# Patient Record
Sex: Female | Born: 1935 | Race: White | Hispanic: No | Marital: Single | Smoking: Former smoker
Health system: Southern US, Community
[De-identification: ages and names within clinical notes are randomized; demographics above are authoritative.]

## PROBLEM LIST (undated history)

## (undated) DIAGNOSIS — D631 Anemia in chronic kidney disease: Secondary | ICD-10-CM

## (undated) DIAGNOSIS — Z853 Personal history of malignant neoplasm of breast: Secondary | ICD-10-CM

## (undated) DIAGNOSIS — H9190 Unspecified hearing loss, unspecified ear: Secondary | ICD-10-CM

## (undated) DIAGNOSIS — I779 Disorder of arteries and arterioles, unspecified: Secondary | ICD-10-CM

## (undated) DIAGNOSIS — E785 Hyperlipidemia, unspecified: Secondary | ICD-10-CM

## (undated) DIAGNOSIS — H02102 Unspecified ectropion of right lower eyelid: Secondary | ICD-10-CM

## (undated) DIAGNOSIS — K219 Gastro-esophageal reflux disease without esophagitis: Secondary | ICD-10-CM

## (undated) DIAGNOSIS — Z8489 Family history of other specified conditions: Secondary | ICD-10-CM

## (undated) DIAGNOSIS — Z9289 Personal history of other medical treatment: Secondary | ICD-10-CM

## (undated) DIAGNOSIS — I2510 Atherosclerotic heart disease of native coronary artery without angina pectoris: Secondary | ICD-10-CM

## (undated) DIAGNOSIS — H547 Unspecified visual loss: Secondary | ICD-10-CM

## (undated) DIAGNOSIS — R29810 Facial weakness: Secondary | ICD-10-CM

## (undated) DIAGNOSIS — M810 Age-related osteoporosis without current pathological fracture: Secondary | ICD-10-CM

## (undated) DIAGNOSIS — C50919 Malignant neoplasm of unspecified site of unspecified female breast: Secondary | ICD-10-CM

## (undated) DIAGNOSIS — N189 Chronic kidney disease, unspecified: Secondary | ICD-10-CM

## (undated) DIAGNOSIS — I959 Hypotension, unspecified: Secondary | ICD-10-CM

## (undated) DIAGNOSIS — Z9981 Dependence on supplemental oxygen: Secondary | ICD-10-CM

## (undated) DIAGNOSIS — K31819 Angiodysplasia of stomach and duodenum without bleeding: Secondary | ICD-10-CM

## (undated) DIAGNOSIS — R011 Cardiac murmur, unspecified: Secondary | ICD-10-CM

## (undated) DIAGNOSIS — R001 Bradycardia, unspecified: Secondary | ICD-10-CM

## (undated) DIAGNOSIS — M1990 Unspecified osteoarthritis, unspecified site: Secondary | ICD-10-CM

## (undated) DIAGNOSIS — N186 End stage renal disease: Secondary | ICD-10-CM

## (undated) DIAGNOSIS — I739 Peripheral vascular disease, unspecified: Secondary | ICD-10-CM

## (undated) DIAGNOSIS — I10 Essential (primary) hypertension: Secondary | ICD-10-CM

## (undated) DIAGNOSIS — Z992 Dependence on renal dialysis: Principal | ICD-10-CM

## (undated) DIAGNOSIS — I340 Nonrheumatic mitral (valve) insufficiency: Secondary | ICD-10-CM

## (undated) HISTORY — DX: Gastro-esophageal reflux disease without esophagitis: K21.9

## (undated) HISTORY — DX: Disorder of arteries and arterioles, unspecified: I77.9

## (undated) HISTORY — PX: BREAST LUMPECTOMY: SHX2

## (undated) HISTORY — DX: Angiodysplasia of stomach and duodenum without bleeding: K31.819

## (undated) HISTORY — PX: BREAST BIOPSY: SHX20

## (undated) HISTORY — DX: Unspecified ectropion of right lower eyelid: H02.102

## (undated) HISTORY — DX: Hyperlipidemia, unspecified: E78.5

## (undated) HISTORY — DX: Nonrheumatic mitral (valve) insufficiency: I34.0

## (undated) HISTORY — DX: Age-related osteoporosis without current pathological fracture: M81.0

## (undated) HISTORY — DX: Atherosclerotic heart disease of native coronary artery without angina pectoris: I25.10

## (undated) HISTORY — DX: Essential (primary) hypertension: I10

## (undated) HISTORY — DX: Chronic kidney disease, unspecified: D63.1

## (undated) HISTORY — DX: End stage renal disease: Z99.2

## (undated) HISTORY — DX: Cardiac murmur, unspecified: R01.1

## (undated) HISTORY — PX: HERNIA REPAIR: SHX51

## (undated) HISTORY — DX: Chronic kidney disease, unspecified: N18.9

## (undated) HISTORY — DX: Personal history of malignant neoplasm of breast: Z85.3

## (undated) HISTORY — DX: Peripheral vascular disease, unspecified: I73.9

## (undated) HISTORY — DX: Facial weakness: R29.810

## (undated) HISTORY — DX: End stage renal disease: N18.6

## (undated) SURGERY — ESOPHAGOGASTRODUODENOSCOPY (EGD) WITH PROPOFOL
Anesthesia: Monitor Anesthesia Care

---
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
## 1964-03-29 HISTORY — PX: EYE SURGERY: SHX253

## 1978-03-29 HISTORY — PX: CATARACT EXTRACTION: SUR2

## 2002-03-29 DIAGNOSIS — Z853 Personal history of malignant neoplasm of breast: Secondary | ICD-10-CM

## 2002-03-29 HISTORY — DX: Personal history of malignant neoplasm of breast: Z85.3

## 2002-12-18 ENCOUNTER — Encounter: Payer: Self-pay | Admitting: General Surgery

## 2002-12-18 ENCOUNTER — Encounter: Admission: RE | Admit: 2002-12-18 | Discharge: 2002-12-18 | Payer: Self-pay | Admitting: General Surgery

## 2002-12-19 ENCOUNTER — Ambulatory Visit (HOSPITAL_COMMUNITY): Admission: RE | Admit: 2002-12-19 | Discharge: 2002-12-19 | Payer: Self-pay | Admitting: General Surgery

## 2002-12-19 ENCOUNTER — Encounter (INDEPENDENT_AMBULATORY_CARE_PROVIDER_SITE_OTHER): Payer: Self-pay | Admitting: General Surgery

## 2002-12-19 ENCOUNTER — Ambulatory Visit (HOSPITAL_BASED_OUTPATIENT_CLINIC_OR_DEPARTMENT_OTHER): Admission: RE | Admit: 2002-12-19 | Discharge: 2002-12-19 | Payer: Self-pay | Admitting: General Surgery

## 2002-12-19 ENCOUNTER — Encounter (INDEPENDENT_AMBULATORY_CARE_PROVIDER_SITE_OTHER): Payer: Self-pay | Admitting: *Deleted

## 2002-12-19 ENCOUNTER — Encounter: Payer: Self-pay | Admitting: General Surgery

## 2002-12-28 ENCOUNTER — Ambulatory Visit: Admission: RE | Admit: 2002-12-28 | Discharge: 2003-01-26 | Payer: Self-pay | Admitting: *Deleted

## 2003-01-31 ENCOUNTER — Ambulatory Visit: Admission: RE | Admit: 2003-01-31 | Discharge: 2003-05-01 | Payer: Self-pay | Admitting: *Deleted

## 2003-02-07 ENCOUNTER — Encounter (INDEPENDENT_AMBULATORY_CARE_PROVIDER_SITE_OTHER): Payer: Self-pay | Admitting: Specialist

## 2003-02-07 ENCOUNTER — Ambulatory Visit (HOSPITAL_COMMUNITY): Admission: RE | Admit: 2003-02-07 | Discharge: 2003-02-07 | Payer: Self-pay | Admitting: General Surgery

## 2003-10-04 ENCOUNTER — Ambulatory Visit: Admission: RE | Admit: 2003-10-04 | Discharge: 2003-10-04 | Payer: Self-pay | Admitting: *Deleted

## 2004-05-13 ENCOUNTER — Inpatient Hospital Stay (HOSPITAL_COMMUNITY): Admission: EM | Admit: 2004-05-13 | Discharge: 2004-05-17 | Payer: Self-pay | Admitting: Emergency Medicine

## 2004-07-13 ENCOUNTER — Ambulatory Visit: Payer: Self-pay | Admitting: Oncology

## 2004-09-21 ENCOUNTER — Ambulatory Visit: Payer: Self-pay

## 2004-10-07 ENCOUNTER — Ambulatory Visit: Admission: RE | Admit: 2004-10-07 | Discharge: 2004-10-07 | Payer: Self-pay | Admitting: *Deleted

## 2005-01-11 ENCOUNTER — Ambulatory Visit: Payer: Self-pay | Admitting: Oncology

## 2005-05-27 ENCOUNTER — Ambulatory Visit: Payer: Self-pay

## 2005-06-27 ENCOUNTER — Ambulatory Visit: Payer: Self-pay

## 2005-07-06 ENCOUNTER — Ambulatory Visit: Payer: Self-pay | Admitting: Oncology

## 2005-07-06 LAB — COMPREHENSIVE METABOLIC PANEL
ALT: 22 U/L (ref 0–40)
AST: 21 U/L (ref 0–37)
Albumin: 4.2 g/dL (ref 3.5–5.2)
Calcium: 9.6 mg/dL (ref 8.4–10.5)
Creatinine, Ser: 1.8 mg/dL — ABNORMAL HIGH (ref 0.4–1.2)
Glucose, Bld: 182 mg/dL — ABNORMAL HIGH (ref 70–99)
Total Protein: 8 g/dL (ref 6.0–8.3)

## 2005-07-06 LAB — CBC WITH DIFFERENTIAL/PLATELET
Basophils Absolute: 0 10*3/uL (ref 0.0–0.1)
Eosinophils Absolute: 0.3 10*3/uL (ref 0.0–0.5)
HGB: 12.8 g/dL (ref 11.6–15.9)
LYMPH%: 27.7 % (ref 14.0–48.0)
MCV: 86.4 fL (ref 81.0–101.0)
MONO%: 8.1 % (ref 0.0–13.0)
NEUT#: 4.1 10*3/uL (ref 1.5–6.5)
NEUT%: 59.2 % (ref 39.6–76.8)
Platelets: 318 10*3/uL (ref 145–400)

## 2005-07-06 LAB — CANCER ANTIGEN 27.29: CA 27.29: 40 U/mL — ABNORMAL HIGH (ref 0–39)

## 2005-07-13 ENCOUNTER — Ambulatory Visit (HOSPITAL_COMMUNITY): Admission: RE | Admit: 2005-07-13 | Discharge: 2005-07-13 | Payer: Self-pay | Admitting: Oncology

## 2005-07-26 ENCOUNTER — Ambulatory Visit (HOSPITAL_COMMUNITY): Admission: RE | Admit: 2005-07-26 | Discharge: 2005-07-26 | Payer: Self-pay | Admitting: Oncology

## 2005-07-27 ENCOUNTER — Ambulatory Visit: Payer: Self-pay

## 2005-08-25 ENCOUNTER — Ambulatory Visit: Payer: Self-pay | Admitting: Oncology

## 2005-08-31 LAB — CBC WITH DIFFERENTIAL/PLATELET
Basophils Absolute: 0 10*3/uL (ref 0.0–0.1)
Eosinophils Absolute: 0.4 10*3/uL (ref 0.0–0.5)
HCT: 38 % (ref 34.8–46.6)
HGB: 12.7 g/dL (ref 11.6–15.9)
MCH: 29.1 pg (ref 26.0–34.0)
MCV: 86.7 fL (ref 81.0–101.0)
MONO%: 9.8 % (ref 0.0–13.0)
NEUT#: 4.4 10*3/uL (ref 1.5–6.5)
NEUT%: 56.6 % (ref 39.6–76.8)
RDW: 15.1 % — ABNORMAL HIGH (ref 11.3–14.5)

## 2005-08-31 LAB — COMPREHENSIVE METABOLIC PANEL
Alkaline Phosphatase: 66 U/L (ref 39–117)
Glucose, Bld: 87 mg/dL (ref 70–99)
Sodium: 141 mEq/L (ref 135–145)
Total Bilirubin: 0.4 mg/dL (ref 0.3–1.2)
Total Protein: 7.8 g/dL (ref 6.0–8.3)

## 2006-01-07 ENCOUNTER — Ambulatory Visit: Payer: Self-pay | Admitting: Oncology

## 2006-03-04 ENCOUNTER — Ambulatory Visit: Payer: Self-pay | Admitting: Oncology

## 2006-03-08 LAB — CBC WITH DIFFERENTIAL/PLATELET
BASO%: 0.4 % (ref 0.0–2.0)
Basophils Absolute: 0 10*3/uL (ref 0.0–0.1)
EOS%: 2.2 % (ref 0.0–7.0)
HCT: 35.4 % (ref 34.8–46.6)
HGB: 11.9 g/dL (ref 11.6–15.9)
LYMPH%: 22.3 % (ref 14.0–48.0)
MCH: 29.7 pg (ref 26.0–34.0)
MCHC: 33.7 g/dL (ref 32.0–36.0)
MCV: 88.1 fL (ref 81.0–101.0)
MONO%: 8.2 % (ref 0.0–13.0)
NEUT%: 66.9 % (ref 39.6–76.8)
Platelets: 352 10*3/uL (ref 145–400)
lymph#: 1.9 10*3/uL (ref 0.9–3.3)

## 2006-03-08 LAB — COMPREHENSIVE METABOLIC PANEL
ALT: 33 U/L (ref 0–35)
AST: 39 U/L — ABNORMAL HIGH (ref 0–37)
Alkaline Phosphatase: 59 U/L (ref 39–117)
BUN: 33 mg/dL — ABNORMAL HIGH (ref 6–23)
Calcium: 8.9 mg/dL (ref 8.4–10.5)
Chloride: 104 mEq/L (ref 96–112)
Creatinine, Ser: 1.54 mg/dL — ABNORMAL HIGH (ref 0.40–1.20)
Total Bilirubin: 0.3 mg/dL (ref 0.3–1.2)

## 2007-03-03 ENCOUNTER — Ambulatory Visit: Payer: Self-pay | Admitting: Oncology

## 2007-03-09 LAB — CBC WITH DIFFERENTIAL/PLATELET
BASO%: 0.6 % (ref 0.0–2.0)
HCT: 34.3 % — ABNORMAL LOW (ref 34.8–46.6)
MCHC: 33.7 g/dL (ref 32.0–36.0)
MONO#: 0.6 10*3/uL (ref 0.1–0.9)
NEUT%: 61.4 % (ref 39.6–76.8)
WBC: 7.4 10*3/uL (ref 3.9–10.0)
lymph#: 2.1 10*3/uL (ref 0.9–3.3)

## 2007-03-09 LAB — COMPREHENSIVE METABOLIC PANEL
ALT: 24 U/L (ref 0–35)
Albumin: 4 g/dL (ref 3.5–5.2)
CO2: 23 mEq/L (ref 19–32)
Calcium: 9.1 mg/dL (ref 8.4–10.5)
Chloride: 106 mEq/L (ref 96–112)
Creatinine, Ser: 1.85 mg/dL — ABNORMAL HIGH (ref 0.40–1.20)
Sodium: 142 mEq/L (ref 135–145)
Total Protein: 7.4 g/dL (ref 6.0–8.3)

## 2007-03-09 LAB — CANCER ANTIGEN 27.29: CA 27.29: 36 U/mL (ref 0–39)

## 2007-12-29 ENCOUNTER — Ambulatory Visit: Payer: Self-pay | Admitting: Oncology

## 2008-01-02 LAB — CBC WITH DIFFERENTIAL/PLATELET
BASO%: 0.3 % (ref 0.0–2.0)
Basophils Absolute: 0 10*3/uL (ref 0.0–0.1)
EOS%: 2.8 % (ref 0.0–7.0)
MCH: 29.9 pg (ref 26.0–34.0)
MCHC: 33.6 g/dL (ref 32.0–36.0)
MCV: 89 fL (ref 81.0–101.0)
MONO%: 9.2 % (ref 0.0–13.0)
NEUT%: 62.7 % (ref 39.6–76.8)
RDW: 14.2 % (ref 11.3–14.5)
lymph#: 2 10*3/uL (ref 0.9–3.3)

## 2008-01-02 LAB — COMPREHENSIVE METABOLIC PANEL
ALT: 37 U/L — ABNORMAL HIGH (ref 0–35)
AST: 36 U/L (ref 0–37)
Alkaline Phosphatase: 62 U/L (ref 39–117)
BUN: 30 mg/dL — ABNORMAL HIGH (ref 6–23)
Calcium: 8.9 mg/dL (ref 8.4–10.5)
Chloride: 105 mEq/L (ref 96–112)
Creatinine, Ser: 1.96 mg/dL — ABNORMAL HIGH (ref 0.40–1.20)

## 2008-07-29 ENCOUNTER — Emergency Department (HOSPITAL_COMMUNITY): Admission: EM | Admit: 2008-07-29 | Discharge: 2008-07-29 | Payer: Self-pay | Admitting: Emergency Medicine

## 2009-01-17 ENCOUNTER — Ambulatory Visit: Payer: Self-pay | Admitting: Oncology

## 2009-01-21 LAB — CBC WITH DIFFERENTIAL/PLATELET
BASO%: 0.3 % (ref 0.0–2.0)
Eosinophils Absolute: 0.1 10*3/uL (ref 0.0–0.5)
LYMPH%: 32.3 % (ref 14.0–49.7)
MCHC: 33.2 g/dL (ref 31.5–36.0)
MONO#: 0.7 10*3/uL (ref 0.1–0.9)
NEUT#: 5.2 10*3/uL (ref 1.5–6.5)
Platelets: 268 10*3/uL (ref 145–400)
RBC: 3.81 10*6/uL (ref 3.70–5.45)
RDW: 14.1 % (ref 11.2–14.5)
WBC: 9 10*3/uL (ref 3.9–10.3)
lymph#: 2.9 10*3/uL (ref 0.9–3.3)

## 2009-01-21 LAB — COMPREHENSIVE METABOLIC PANEL
ALT: 36 U/L — ABNORMAL HIGH (ref 0–35)
Albumin: 3.7 g/dL (ref 3.5–5.2)
CO2: 24 mEq/L (ref 19–32)
Chloride: 103 mEq/L (ref 96–112)
Glucose, Bld: 93 mg/dL (ref 70–99)
Potassium: 3.9 mEq/L (ref 3.5–5.3)
Sodium: 138 mEq/L (ref 135–145)
Total Bilirubin: 0.5 mg/dL (ref 0.3–1.2)
Total Protein: 7.8 g/dL (ref 6.0–8.3)

## 2009-01-21 LAB — CANCER ANTIGEN 27.29: CA 27.29: 37 U/mL (ref 0–39)

## 2009-01-23 ENCOUNTER — Ambulatory Visit (HOSPITAL_COMMUNITY): Admission: RE | Admit: 2009-01-23 | Discharge: 2009-01-23 | Payer: Self-pay | Admitting: Oncology

## 2009-02-26 ENCOUNTER — Encounter: Payer: Self-pay | Admitting: Cardiovascular Disease

## 2009-02-26 ENCOUNTER — Ambulatory Visit: Payer: Self-pay

## 2009-03-29 HISTORY — PX: UMBILICAL HERNIA REPAIR: SHX196

## 2009-09-03 ENCOUNTER — Ambulatory Visit: Payer: Self-pay | Admitting: Vascular Surgery

## 2010-01-08 ENCOUNTER — Ambulatory Visit: Payer: Self-pay | Admitting: Oncology

## 2010-01-12 LAB — COMPREHENSIVE METABOLIC PANEL
ALT: 27 U/L (ref 0–35)
AST: 35 U/L (ref 0–37)
Albumin: 3.6 g/dL (ref 3.5–5.2)
Alkaline Phosphatase: 44 U/L (ref 39–117)
BUN: 66 mg/dL — ABNORMAL HIGH (ref 6–23)
CO2: 28 mEq/L (ref 19–32)
Calcium: 8.7 mg/dL (ref 8.4–10.5)
Chloride: 106 mEq/L (ref 96–112)
Creatinine, Ser: 5.33 mg/dL — ABNORMAL HIGH (ref 0.40–1.20)
Glucose, Bld: 68 mg/dL — ABNORMAL LOW (ref 70–99)
Potassium: 4 mEq/L (ref 3.5–5.3)
Sodium: 141 mEq/L (ref 135–145)
Total Bilirubin: 0.7 mg/dL (ref 0.3–1.2)
Total Protein: 7.2 g/dL (ref 6.0–8.3)

## 2010-01-12 LAB — CBC WITH DIFFERENTIAL/PLATELET
BASO%: 0.4 % (ref 0.0–2.0)
Basophils Absolute: 0 10*3/uL (ref 0.0–0.1)
EOS%: 2 % (ref 0.0–7.0)
Eosinophils Absolute: 0.2 10*3/uL (ref 0.0–0.5)
HCT: 32.7 % — ABNORMAL LOW (ref 34.8–46.6)
HGB: 11.1 g/dL — ABNORMAL LOW (ref 11.6–15.9)
LYMPH%: 21 % (ref 14.0–49.7)
MCH: 30.3 pg (ref 25.1–34.0)
MCHC: 33.8 g/dL (ref 31.5–36.0)
MCV: 89.4 fL (ref 79.5–101.0)
MONO#: 0.7 10*3/uL (ref 0.1–0.9)
MONO%: 8 % (ref 0.0–14.0)
NEUT#: 6.1 10*3/uL (ref 1.5–6.5)
NEUT%: 68.6 % (ref 38.4–76.8)
Platelets: 257 10*3/uL (ref 145–400)
RBC: 3.66 10*6/uL — ABNORMAL LOW (ref 3.70–5.45)
RDW: 14.6 % — ABNORMAL HIGH (ref 11.2–14.5)
WBC: 8.8 10*3/uL (ref 3.9–10.3)
lymph#: 1.9 10*3/uL (ref 0.9–3.3)

## 2010-01-13 ENCOUNTER — Ambulatory Visit (HOSPITAL_COMMUNITY): Admission: RE | Admit: 2010-01-13 | Discharge: 2010-01-13 | Payer: Self-pay | Admitting: General Surgery

## 2010-01-13 LAB — VITAMIN D 25 HYDROXY (VIT D DEFICIENCY, FRACTURES): Vit D, 25-Hydroxy: 40 ng/mL (ref 30–89)

## 2010-01-21 ENCOUNTER — Ambulatory Visit: Payer: Self-pay | Admitting: Genetic Counselor

## 2010-06-10 LAB — GLUCOSE, CAPILLARY
Glucose-Capillary: 111 mg/dL — ABNORMAL HIGH (ref 70–99)
Glucose-Capillary: 154 mg/dL — ABNORMAL HIGH (ref 70–99)

## 2010-06-10 LAB — BASIC METABOLIC PANEL
BUN: 54 mg/dL — ABNORMAL HIGH (ref 6–23)
CO2: 26 mEq/L (ref 19–32)
Calcium: 9.6 mg/dL (ref 8.4–10.5)
Chloride: 105 mEq/L (ref 96–112)
Creatinine, Ser: 3.93 mg/dL — ABNORMAL HIGH (ref 0.4–1.2)
GFR calc Af Amer: 13 mL/min — ABNORMAL LOW (ref >60)
GFR calc non Af Amer: 11 mL/min — ABNORMAL LOW (ref >60)
Glucose, Bld: 72 mg/dL (ref 70–99)
Potassium: 4.3 mEq/L (ref 3.5–5.1)
Sodium: 142 mEq/L (ref 135–145)

## 2010-06-10 LAB — DIFFERENTIAL
Basophils Absolute: 0 10*3/uL (ref 0.0–0.1)
Basophils Relative: 0 % (ref 0–1)
Eosinophils Absolute: 0.3 10*3/uL (ref 0.0–0.7)
Eosinophils Relative: 3 % (ref 0–5)
Lymphocytes Relative: 28 % (ref 12–46)
Lymphs Abs: 2.5 10*3/uL (ref 0.7–4.0)
Monocytes Absolute: 0.8 10*3/uL (ref 0.1–1.0)
Monocytes Relative: 9 % (ref 3–12)
Neutro Abs: 5.5 10*3/uL (ref 1.7–7.7)
Neutrophils Relative %: 60 % (ref 43–77)

## 2010-06-10 LAB — CBC
HCT: 35.6 % — ABNORMAL LOW (ref 36.0–46.0)
Hemoglobin: 11.9 g/dL — ABNORMAL LOW (ref 12.0–15.0)
MCH: 30 pg (ref 26.0–34.0)
MCHC: 33.3 g/dL (ref 30.0–36.0)
MCV: 89.8 fL (ref 78.0–100.0)
Platelets: 277 10*3/uL (ref 150–400)
RBC: 3.96 MIL/uL (ref 3.87–5.11)
RDW: 14.4 % (ref 11.5–15.5)
WBC: 9.1 10*3/uL (ref 4.0–10.5)

## 2010-06-10 LAB — SURGICAL PCR SCREEN: Staphylococcus aureus: POSITIVE — AB

## 2010-06-10 LAB — APTT: aPTT: 28 seconds (ref 24–37)

## 2010-06-10 LAB — PROTIME-INR
INR: 0.93 (ref 0.00–1.49)
Prothrombin Time: 12.7 seconds (ref 11.6–15.2)

## 2010-07-08 ENCOUNTER — Ambulatory Visit
Admission: RE | Admit: 2010-07-08 | Discharge: 2010-07-08 | Disposition: A | Discharge status: Home / Self Care | Payer: Medicare Other | Source: Ambulatory Visit | Attending: Nephrology | Admitting: Nephrology

## 2010-07-08 ENCOUNTER — Other Ambulatory Visit: Payer: Self-pay | Admitting: Nephrology

## 2010-07-08 DIAGNOSIS — R0602 Shortness of breath: Secondary | ICD-10-CM

## 2010-07-08 DIAGNOSIS — R062 Wheezing: Secondary | ICD-10-CM

## 2010-07-16 ENCOUNTER — Encounter (INDEPENDENT_AMBULATORY_CARE_PROVIDER_SITE_OTHER): Payer: Medicare Other | Admitting: Vascular Surgery

## 2010-07-16 DIAGNOSIS — N186 End stage renal disease: Secondary | ICD-10-CM

## 2010-07-17 NOTE — Assessment & Plan Note (Signed)
OFFICE VISIT  [name], [name] A DOB:  04/07/1931                                       07/16/2010 CHART#:[id number]  The patient returns for follow-up today.  She was seen in June 2011 for possible placement of hemodialysis access.  The patient refused to have the access placed at that time.  She now returns for further evaluation and consideration again for placement of hemodialysis access.  She is currently not on hemodialysis.  She is right-handed.  Chronic medical problems remain diabetes, hypertension, elevated cholesterol which are followed by Dr. Vear Clock and Dr. Allena Katz and currently controlled.  PAST MEDICAL HISTORY:  Remarkable for breast cancer in 2004.  She also has a chronic right facial droop.  REVIEW OF SYSTEMS:  She denies any skin itching.  She denies any shortness of breath.  She denies any chest pain.  PHYSICAL EXAMINATION:  Blood pressure 160/57 in the left arm, heart rate 67 and regular, respirations 16, oxygen saturation is 99% on room air. In the right upper extremity she has a 2+ brachial and 2+ radial pulse. On placement tourniquet on the right arm, the cephalic vein is palpable at the antecubital region.  I reviewed her vein mapping ultrasound from June 2011 which shows the upper arm cephalic vein on the right side to be between 29 and 31 mm in diameter.  I believe the best option at this point would be to consider placing a right brachiocephalic AV fistula.  Risks, benefits, possible complications and procedure details were explained to the patient to include but not limited to bleeding, infection, non maturation of the fistula.  She understands and agrees to proceed.  We have scheduled her fistula placement for July 20, 2010.    Janetta Hora. Adwoa Axe, MD Electronically Signed  CEF/MEDQ  D:  07/16/2010  T:  07/17/2010  Job:  [id number]  cc:   Zetta Bills, MD

## 2010-07-20 ENCOUNTER — Ambulatory Visit (HOSPITAL_COMMUNITY): Payer: Medicare Other

## 2010-07-20 ENCOUNTER — Ambulatory Visit (HOSPITAL_COMMUNITY)
Admission: RE | Admit: 2010-07-20 | Discharge: 2010-07-20 | Disposition: A | Discharge status: Home / Self Care | Payer: Medicare Other | Source: Ambulatory Visit | Attending: Vascular Surgery | Admitting: Vascular Surgery

## 2010-07-20 DIAGNOSIS — F3289 Other specified depressive episodes: Secondary | ICD-10-CM | POA: Insufficient documentation

## 2010-07-20 DIAGNOSIS — I120 Hypertensive chronic kidney disease with stage 5 chronic kidney disease or end stage renal disease: Secondary | ICD-10-CM

## 2010-07-20 DIAGNOSIS — N186 End stage renal disease: Secondary | ICD-10-CM

## 2010-07-20 DIAGNOSIS — F329 Major depressive disorder, single episode, unspecified: Secondary | ICD-10-CM | POA: Insufficient documentation

## 2010-07-20 DIAGNOSIS — E119 Type 2 diabetes mellitus without complications: Secondary | ICD-10-CM | POA: Insufficient documentation

## 2010-07-20 HISTORY — PX: AV FISTULA PLACEMENT: SHX1204

## 2010-07-20 LAB — GLUCOSE, CAPILLARY: Glucose-Capillary: 183 mg/dL — ABNORMAL HIGH (ref 70–99)

## 2010-07-20 LAB — POCT I-STAT 4, (NA,K, GLUC, HGB,HCT): Sodium: 140 mEq/L (ref 135–145)

## 2010-07-20 LAB — SURGICAL PCR SCREEN
MRSA, PCR: NEGATIVE
Staphylococcus aureus: NEGATIVE

## 2010-08-03 NOTE — Op Note (Signed)
  NAMELAURI, [name]                 ACCOUNT NO.:  [id number]  MEDICAL RECORD NO.:  [id number]           PATIENT TYPE:  O  LOCATION:  SDSC                         FACILITY:  [facility]  PHYSICIAN:  Charles E. Fields, MD  DATE OF BIRTH:  February 20, 1932  DATE OF PROCEDURE:  07/20/2010 DATE OF DISCHARGE:                              OPERATIVE REPORT   PROCEDURE:  Right brachiocephalic arteriovenous fistula.  PREOPERATIVE DIAGNOSIS:  End-stage renal disease.  POSTOPERATIVE DIAGNOSIS:  End-stage renal disease.  ANESTHESIA:  General.  ASSISTANT:  Newton Pigg, PA-C  OPERATIVE FINDINGS: 1. 3-3.5 mm right cephalic vein. 2. 3-mm right brachial artery.  OPERATIVE DETAILS:  After obtaining informed consent, the patient was taken to the operating room.  The patient was placed in supine position on operating table.  After induction of general anesthesia and endotracheal intubation, the patient's entire right upper extremity was prepped and draped in usual sterile fashion.  Transverse incision was made on the right antecubital crease, carried down to the subcutaneous tissues down the level of the right cephalic vein.  Cephalic vein was dissected free circumferentially and small side branches were ligated and divided between silk ties or clips.  Vein was of good quality approximately 3-3.5 mm in diameter.  Next, the brachial artery was dissected free in the medial portion of the incision.  This was approximately 3 mm in diameter.  Small side branches were ligated and divided between silk ties.  The patient was given 5000 units of intravenous heparin.  Vessel loops were used to control the brachial artery proximally and distally.  Distal cephalic vein was ligated with a 2-0 silk tie.  The vein was then transected and swung over the level of the artery.  The vein was controlled proximally with fine bulldog clamp. Vein was then sewn end-of-vein to side-of- artery after creating a longitudinal  arteriotomy using a running 7-0 Prolene suture.  Just prior to completion of anastomosis, it was fore bled, back bled and thoroughly flushed.  Anastomosis was secured.  Vessel loops released.  There was a palpable thrill in the fistula immediately.  The patient had a palpable radial pulse as well.  Hemostasis was obtained.  Subcutaneous tissues were reapproximated using running 3-0 Vicryl suture.  Skin was closed with a 4-0 Vicryl subcuticular stitch.  The patient tolerated the procedure well and there were no complications.  Instrument, sponge and needle counts were correct at the end of the case.  The patient was extubated in the operating room and taken to recovery room in stable condition.     [name], MD     CEF/MEDQ  D:  07/20/2010  T:  07/20/2010  Job:  [id number]  Electronically Signed by Fabienne Bruns MD on 08/03/2010 07:57:27 AM

## 2010-08-11 NOTE — Assessment & Plan Note (Signed)
OFFICE VISIT   Jill [name], Jill [name]  DOB:  Apr 14, 1931                                       09/03/2009  CHART#:[id number]   CHIEF COMPLAINT:  Needs dialysis access.   HISTORY OF PRESENT ILLNESS:  The patient is [name] 77 year old female  referred by Dr. Allena [name] for evaluation for hemodialysis access.  She is  currently not on hemodialysis.  She has nephropathy thought to be  secondary to diabetes.  She is right-handed.  Chronic medical problems  include diabetes, hypertension and elevated cholesterol.  These are all  followed by Dr. Vear [name] and Dr. Allena [name] and are currently controlled.   Past medical history otherwise is remarkable for breast cancer which she  had in 2004.  She had [name] sentinel node biopsy in the left axilla and [name]  left breast lumpectomy.  She has been disease free for since that time.  She also has some baseline numbness in her left hand.  She also has [name]  right facial droop.   PAST SURGICAL HISTORY:  She had some type of eye procedure in 1966 and  had [name] cataract removed in 1981.   Family history is remarkable for her brother who had vascular disease at  [name] young age.   MEDICATIONS:  Amlodipine, torsemide, paroxetine, simvastatin, glyburide,  tamoxifen, aspirin, calcium and Centrum Silver.  Also aspirin 81 mg once  [name] day.   She has allergy listed to sulfa which causes [name] rash.   PHYSICAL EXAMINATION:  Blood pressure is 186/78 in the left arm, heart  rate is 85 and regular, respirations 20.  HEENT is unremarkable.  Neck  has 2+ carotid pulses without bruit.  Chest is clear to auscultation.  Cardiac exam is regular rate and rhythm without murmur.  Abdomen was  soft, nontender, nondistended.  No masses.  Extremities:  She has 2+  brachial and radial pulses bilaterally.  The cephalic vein is palpable  on placement of the tourniquet but difficult to palpate.  Musculoskeletal exam shows no obvious major joint deformities.  Neurologic exam shows [name] right  facial droop.  Skin has no open ulcers or  rashes.   She had [name] vein mapping ultrasound of both upper extremities today.  This  showed [name] fairly small vein bilaterally but she did have [name] reasonable  sized cephalic vein on the right side to consider placing [name] right  brachiocephalic AV fistula.  I discussed with the patient the risks,  benefits, possible complications and procedure details for fistula  placement.  She recently had [name] brother who was ill in the hospital and  wants to wait [name] few weeks before having the fistula placed.  Therefore  we have scheduled this for 09/18/2009.  I discussed with her today  risks, benefits and complications including bleeding, infection,  ischemic steal, nonmaturation of the fistula.  She understands and  agrees to proceed.     Jill Hora. Fields, MD  Electronically Signed   CEF/MEDQ  D:  09/04/2009  T:  09/04/2009  Job:  [id number]   cc:   Jill Bills, MD  Jill [name]

## 2010-08-11 NOTE — Procedures (Signed)
CEPHALIC VEIN MAPPING   INDICATION:  Preop for AV fistula placement.   HISTORY:  Stage 4 chronic kidney disease.   EXAM:  The right cephalic vein is compressible with diameter measurements  ranging from 0.21 to 0.31 cm.   The right basilic vein is compressible with diameter measurements  ranging from 0.27 to 0.28 cm.   The left cephalic vein is compressible with diameter measurements  ranging from 0.14 to 0.32 cm.   The left basilic vein is compressible with diameter measurements ranging  from 0.32 to 0.38 cm.   See attached worksheet for all measurements.   IMPRESSION:  Patent bilateral cephalic and basilic veins with diameter  measurements as described above.   ___________________________________________  Jill Hora. Fields, MD   CH/MEDQ  D:  09/03/2009  T:  09/03/2009  Job:  [id number]

## 2010-08-14 ENCOUNTER — Encounter (HOSPITAL_COMMUNITY): Payer: Medicare Other | Attending: Nephrology

## 2010-08-14 ENCOUNTER — Other Ambulatory Visit: Payer: Self-pay | Admitting: Nephrology

## 2010-08-14 DIAGNOSIS — D638 Anemia in other chronic diseases classified elsewhere: Secondary | ICD-10-CM | POA: Insufficient documentation

## 2010-08-14 DIAGNOSIS — N185 Chronic kidney disease, stage 5: Secondary | ICD-10-CM | POA: Insufficient documentation

## 2010-08-14 NOTE — H&P (Signed)
NAME:  GHISLAINE, HARCUM NO.:  [id number]   MEDICAL RECORD NO.:  [id number]          PATIENT TYPE:  EMS   LOCATION:  ED                           [facility]ACILITY[facility]  [facility]   PHYSICIAN:  Elliot Cousin, M.D.    DATE OF BIRTH:  25-Jun-1931   DATE OF ADMISSION:  05/13/2004  DATE OF DISCHARGE:                                HISTORY & PHYSICAL   PRIMARY CARE PHYSICIAN:  Loma Sender, M.D.   ONCOLOGIST:  Valentino Hue. Magrinat, M.D.   RADIATION ONCOLOGIST:  Elmer Sow. Dorna Bloom, M.D.   CHIEF COMPLAINT:  I feel sick.   HISTORY OF PRESENT ILLNESS:  The patient is a 77 year old lady with a past  medical history significant for type 2 diabetes mellitus, hypertension, and  left breast cancer who presented to the emergency department this morning  with a one-week history of intermittent nausea, vomiting, and diarrhea.  The  patient's nausea, vomiting, and diarrhea were much worse over the past  weekend which was three days ago.  The patient has been able to eat only a  little.  She has been able to drink a little juice and a little water.  She  has had multiple bouts of nausea and vomiting over the past 48 hours.  The  diarrhea is not quite as bad.  She has only had four to five loose bowel  movements over the past 48 hours.  The patient denies hemoptysis.  She  denies hematemesis.  She denies melena.  She denies bright red blood per  rectum.  She denies any sick contacts.  She has not had any recent  antibiotic therapy.  She complains of generalized weakness but no focal  weakness.  The patient had a dream this morning and slid out of bed.  There  was no head trauma.  Her brother, who had been staying with her over the  past couple of nights, found her on the floor lying on pillows.  The patient  believes that she called out to her brother who came into the room. When her  brother, Mr. Amilia Vandenbrink, helped her up onto the bed, he did notice that the  patient was a little disoriented but  quickly became oriented.  There was no  focal weakness, facial droop, slurred speech or trauma.  There was no  witnessed seizure activity.  The patient, however, did appear diaphoretic.  When EMS arrived, it was noted that her capillary blood sugar was 59.  The  patient was given some Coca-Cola and was transferred to the emergency  department.   When the patient arrived to the emergency department, her temperature was  102.3, heart rate 118, and oxygen saturation 94% on room air.  Her blood  glucose was 79. Her white blood cell count was elevated at 15.1.  Her sodium  was low at 127. The urinalysis was essentially negative with the exception  of protein.  Her LFTs were elevated with a SGOT of 90 and a SGPT of 176.  Her chest x-ray essentially revealed no acute disease.  The patient will be  admitted  for further evaluation and management.   MEDICATIONS:  1.  Glucovance 500 mg/5 mg daily.  2.  Norvasc 5 mg daily.  3.  Arimidex 1 mg daily.  4.  Calcium with vitamin D 600 mg b.i.d.  5.  Aspirin 81 mg daily.   ALLERGIES:  The patient has an allergy to SULFA DRUGS.  She breaks out with  a severe rash.   PAST MEDICAL HISTORY:  1.  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  2.  Type 2 diabetes mellitus diagnosed in 1980.  3.  Hypertension.  4.  Stage I carcinoma of the left breast, status post partial mastectomy and      radiation therapy in 2004.  5.  Status post bilateral cataract removal in the past.   SOCIAL HISTORY:  The patient is single.  She has no children. She lives  alone in [place], [place].  She drinks wine occasionally.  She does  not smoke.  She can read and write.  She is retired.  She is fairly  independent and still drives.   FAMILY HISTORY:  Her mother died of old age at 57 years of age.  Her father  died of stomach cancer at 55 years of age. She has one brother who is living  who is 58 years of age and has heart disease and diabetes  mellitus.  She has  one sister who is 76 years of age and has Alzheimer's disease.  She had one  sister who died in childhood of a rare blood disorder.   REVIEW OF SYMPTOMS:  The patient's review of systems is positive for chronic  drainage of her right eye, right facial paralysis, occasional arthritis  pain.  The patient had very infrequent low blood sugar reactions.  Her last  one was approximately four to five years ago. The patient's review of  systems is negative for any upper respiratory infection symptoms  specifically.  The patient denies a stiff neck.  She denies dysuria.  The  patient's review of systems is also positive for chronic low back pain.  She  denies abdominal pain.  Otherwise her review of systems is negative.   PHYSICAL EXAMINATION:  GENERAL APPEARANCE:  The patient is a pleasant obese  77 year old Caucasian woman who is currently lying in bed in no acute  distress.  VITAL SIGNS:  Temperature 102.3, blood pressure 130/83, pulse 118,  respiratory rate 32, oxygen saturation ranging between 89 and 94% on room  air.  HEENT:  Head is normocephalic and atraumatic.  The right pupil is not  reactive to light.  The left pupil is reactive to light.  There is a scant  amount of clear to yellow drainage from the eye which the patient states is  chronic.  Extraocular movements are intact.  She does have a right eye  ptosis.  Tympanic membranes are clear bilaterally.  Nasal mucosa is dry.  Oropharynx reveals no teet, mucous membranes are dry.  No posterior exudates  or erythema.  NECK:  Supple and obese.  No adenopathy.  No thyromegaly.  No bruit, no JVD.  LUNGS:  Clear to auscultation bilaterally. Thoracic kyphosis.  CARDIOVASCULAR:  S1 and S2 with mild tachycardia.  ABDOMEN:  Obese, positive bowel sounds, soft, nontender and nondistended  with no hepatosplenomegaly, no masses palpated.  RECTAL AND GU:  Deferred. EXTREMITIES:  The patient has excellent range of motion of all  of her  extremities.  No acute joint abnormalities.  Pedal pulses  2+ bilaterally.  NEUROLOGIC:  The patient has right-sided facial droop.  She does have mild  dysarthria secondary to the partial right facial paralysis.  She has ptosis  of her right eye.  Otherwise her cranial nerves are intact.  She is alert  and oriented x3.  She is rather articulate and intelligent by evidence of  her speech and her history and her good recall of historical events.  Strength is 5/5 throughout.  Sensation is intact throughout.   ADMISSION LABORATORY DATA:  A wbc of 15.1, hemoglobin 12.5, hematocrit 37.7,  MCV 86.7, absolute neutrophil count 13.9, platelets 318.  Sodium 127,  potassium 4.4, chloride 92, CO2 24, glucose 79, BUN 44, creatinine 2.0,  calcium 8.1, total protein 7.4, albumin 2.5, AST 90, ALT 176, alkaline  phosphatase 150, total bilirubin 1.8.  Urinalysis:  Urine hemoglobin is  moderate, urine protein is 100, urine nitrite negative, urine leukocyte  esterase negative, urine wbc 0 to 2, urine rbc 0 to 2.   Chest x-ray with no active disease.  Chronic elevation of the right  hemidiaphragm.  Old lower thoracic compression fracture with kyphosis.   ASSESSMENT:  1.  Generalized weakness.  The patient's generalized weakness is      multifactorial including hypoglycemia, volume depletion, nausea,      vomiting, and diarrhea, and fever.  2.  Acute renal insufficiency.  The patient has no prior history of chronic      or acute renal disease.  The acute renal insufficiency is probably      secondary to prerenal azotemia.  The patient's admission BUN is 44 and      creatinine is 2.0.  3.  Nausea, vomiting, and diarrhea.  The patient probably has a      gastroenteritis.  However, given her elevated liver transaminases, will      also consider gallbladder disease.  4.  Fever.  The etiology of the fever is uncertain at this point.  Will      consider gastroenteritis as the primary cause, and  hepatobiliary disease      secondarily.  The patient's urinalysis and chest x-ray do not suggest      infection.  5.  Hyponatremia. Probably secondary to hypovolemia.   PLAN:  1.  The patient will be admitted for further evaluation and management.  2.  Volume repletion with D5 normal saline at 100 mL an hour x2 liters and      then will change to IV fluids to normal saline without dextrose.  3.  Will start a sliding scale insulin regimen but very conservatively  to      avoid hypoglycemia. Monitor CBGs closely.  4.  Will add one amp of multivitamin with iron to one liter of IV fluids      daily.  Will treat nausea with Zofran as needed.  Will start empiric      Protonix at 40 mg IV daily.  5.  Will try clear liquid diet.  If the patient can tolerate a clear liquid      diet, will advance accordingly.  6.  Will assess the patient further with an acute viral hepatitis panel,     TSH, abdominal ultrasound, urine culture and stool studies.  7.  Will check orthostatic vital signs in the a.m.      DF/MEDQ  D:  05/13/2004  T:  05/13/2004  Job:  [id number]   cc:   Loma Sender  [address]  [place]  [place] [address]  Fax: [phone]   Valentino Hue. Magrinat, M.D.  [address] [facility]  [place]  [place] [address]  Fax: [phone]   Elmer Sow. Dorna Bloom, M.D.  Fax: [phone]

## 2010-08-14 NOTE — Op Note (Signed)
NAME:  [name], [name]                           ACCOUNT NO.:  [id number]   MEDICAL RECORD NO.:  [id number]                   PATIENT TYPE:  AMB   LOCATION:  DAY                                  [facility]ACILITY[facility]   PHYSICIAN:  Rose Phi. Maple Hudson, M.D.                DATE OF BIRTH:  [birth year]   DATE OF PROCEDURE:  02/07/2203  DATE OF DISCHARGE:                                 OPERATIVE REPORT   PREOPERATIVE DIAGNOSIS:  Stage 1 carcinoma of the left breast.   POSTOPERATIVE DIAGNOSIS:  Stage 1 carcinoma of the left breast.   OPERATION:  Insertion of MammoSite.   SURGEON:  Rose Phi. Maple Hudson, M.D.   ANESTHESIA:  General.   OPERATIVE PROCEDURE:  After suitable general anesthesia was induced, the  patient was placed in the supine position with the arms extended on the arm  board.  The left breast and axilla was prepped and draped in the usual  fashion.   We did an ultrasound evaluation of the cavity, which was really only about  1.2 cm in diameter; this being six weeks since her lumpectomy.  She had a  good skin-to-cavity distance of 1.5 cm.   A small incision was made in the inframammary pole and a trocar inserted.  However, the cavity was so small we were never able to get a satisfactory  insertion.   I then opened the old lumpectomy incision and exposed the cavity.  We then  put the trocar under direct vision to that, and then retrieved it and passed  the MammoSite balloon.  Because of the size of the space, it would not  contain the balloon to be properly inflated; so, I excised more of the  lumpectomy cavity to create a larger cavity.  We were then easily able to  blow the balloon up to 45 or 50 cc.   We then retracted the balloon and closed the incision in two layers with 3-0  Vicryl and subcuticular 4-0 Monocryl with Steri-Strips.  the balloon was  then reinserted and inflated to 45 cc.  This gave [place] a good 1.2 cm skin-to-  balloon  distance.  With the Steri-Strips on and some  Neosporin around the exit site  at the inframammary fold of the balloon, dressings were then applied and the  patient transferred to the recovery room in satisfactory condition; having  tolerated the procedure well.                                               Rose Phi. Maple Hudson, M.D.    PRY/MEDQ  D:  02/07/2003  T:  02/07/2003  Job:  [id number]   cc:   Elmer Sow. Dorna Bloom, M.D.  [address] [facility]  [place]  [place]  [address]  Fax: [phone]

## 2010-08-14 NOTE — Discharge Summary (Signed)
[name], [name]                 ACCOUNT NO.:  [id number]   MEDICAL RECORD NO.:  [id number]          PATIENT TYPE:  INP   LOCATION:  0362                         [facility]AC[facility]   PHYSICIAN:  Gertha Calkin, M.D.DATE OF BIRTH:  July 05, 1931   DATE OF ADMISSION:  05/13/2004  DATE OF DISCHARGE:                                 DISCHARGE SUMMARY   PRIMARY CARE PHYSICIAN:  Dr. Loma Sender   ONCOLOGIST:  Dr. Darnelle Catalan   RADIATION ONCOLOGIST:  Dr. Dorna Bloom   DISCHARGE DIAGNOSES:  1.  Klebsiella bacteremia.  2.  Diabetes mellitus.  3.  Hypertension.  4.  History of right sided facial paralysis secondary to instrumentation at      birth.  5.  History of left breast cancer, status post lumpectomy and radiation      treatment in 2004.   DISCHARGE MEDICATIONS:  1.  Cipro 500 mg p.o. b.i.d. to finish 10-day outpatient course.  2.  Norvasc 5 mg p.o. daily.  3.  Arimidex one mg p.o. daily.  4.  Calcium with vitamin D one pill p.o. b.i.d.  5.  Aspirin 81 mg p.o. daily.  6.  Glucovance 500/5 mg once daily.  7.  Albuterol inhaler p.r.n.   DISCHARGE DIET:  Continue diabetic printed diet.   HOSPITAL COURSE:  1.  Klebsiella bacteremia.  Please see initial presentation for details on      admission.  Patient came in feeling sick and she was pancultured and      blood cultures returned back with Klebsiella pneumonia. Patient was      started on antibiotics once cultures came back positive for gram-      positive cocci in pairs and chains, started on Rocephin and that was      tailored to fluoroquinolone when sensitivities returned back.  She      turned around clinically within the matter of 24 hours after initiating      IV Rocephin.  She has remained afebrile with being on Cipro.  She is to      finish a outpatient basis a two week total course of Cipro for her      bacteremia.   1.  Hypoglycemia, known diabetic.  This was due to decreased p.o. intake      from just malaise and loss of  appetite from problem #1.  Patient was      initially given IV d-5 to help with her hypoglycemia and once her sugars      returned to normal and she was having a good diet, we resumed her      medications.  At the time of discharge, I have not made any changes to      her home doses and she is to return to her Glucovance.  This can be      followed up in outpatient setting.   1.  Anemia. Patient initially presented with hemoglobin of 12.5 but then      after hydration dropped to 10 and Hemoccult's were negative.  Her      discharge hemoglobin was 9.8.  This again, since she is stable      hemodynamically as well as no episodes of acute blood loss during this      hospitalization, is stable for outpatient work up.   1.  Increased liver function studies.  Patient came in with initial findings      of increased LFT's, not in an obstructive pattern and not in an      alcoholic pattern.  Hepatitis panel was obtained but returned back      negative.  No clinical correlation between her LFT's and her      presentation would follow this up with repeat LFT's are treatment for      her bacteremia.   DISPOSITION:  Stable on discharge, tolerating diet, ambulating the hallways,  vitals were stable and she remained afebrile on her p.o. Cipro.      JD/MEDQ  D:  05/17/2004  T:  05/18/2004  Job:  [id number]   cc:   Loma Sender  [address]  [place]  [place] [address]  Fax: [phone]   Valentino Hue. Magrinat, M.D.  [address] [facility]  [place]  [place] [address]  Fax: [phone]   Elmer Sow. Dorna Bloom, M.D.  Fax: [phone]

## 2010-08-17 LAB — POCT HEMOGLOBIN-HEMACUE: Hemoglobin: 10.3 g/dL — ABNORMAL LOW (ref 12.0–15.0)

## 2010-08-21 ENCOUNTER — Other Ambulatory Visit: Payer: Self-pay | Admitting: Nephrology

## 2010-08-21 ENCOUNTER — Encounter (HOSPITAL_COMMUNITY): Payer: Medicare Other

## 2010-08-21 LAB — POCT HEMOGLOBIN-HEMACUE: Hemoglobin: 10.1 g/dL — ABNORMAL LOW (ref 12.0–15.0)

## 2010-08-27 ENCOUNTER — Ambulatory Visit: Payer: BC Managed Care – PPO

## 2010-09-01 ENCOUNTER — Encounter (HOSPITAL_COMMUNITY): Payer: Medicare Other | Attending: Nephrology

## 2010-09-01 ENCOUNTER — Other Ambulatory Visit: Payer: Self-pay | Admitting: Nephrology

## 2010-09-01 DIAGNOSIS — D638 Anemia in other chronic diseases classified elsewhere: Secondary | ICD-10-CM | POA: Insufficient documentation

## 2010-09-01 DIAGNOSIS — N185 Chronic kidney disease, stage 5: Secondary | ICD-10-CM | POA: Insufficient documentation

## 2010-09-01 LAB — CBC
HCT: 30.6 % — ABNORMAL LOW (ref 36.0–46.0)
MCHC: 33 g/dL (ref 30.0–36.0)
Platelets: 367 10*3/uL (ref 150–400)
RDW: 14.4 % (ref 11.5–15.5)
WBC: 9.7 10*3/uL (ref 4.0–10.5)

## 2010-09-01 LAB — RENAL FUNCTION PANEL
CO2: 28 mEq/L (ref 19–32)
Calcium: 9.5 mg/dL (ref 8.4–10.5)
Creatinine, Ser: 5.98 mg/dL — ABNORMAL HIGH (ref 0.4–1.2)
GFR calc Af Amer: 8 mL/min — ABNORMAL LOW (ref >60)
GFR calc non Af Amer: 7 mL/min — ABNORMAL LOW (ref >60)
Phosphorus: 5.1 mg/dL — ABNORMAL HIGH (ref 2.3–4.6)

## 2010-09-01 LAB — MAGNESIUM: Magnesium: 2.7 mg/dL — ABNORMAL HIGH (ref 1.5–2.5)

## 2010-09-01 LAB — IRON AND TIBC: UIBC: 389 ug/dL

## 2010-09-03 ENCOUNTER — Ambulatory Visit (INDEPENDENT_AMBULATORY_CARE_PROVIDER_SITE_OTHER): Payer: Medicare Other

## 2010-09-03 DIAGNOSIS — N186 End stage renal disease: Secondary | ICD-10-CM

## 2010-09-03 NOTE — Assessment & Plan Note (Signed)
OFFICE VISIT  [name], [name] A DOB:  09-29-1931                                       09/03/2010 CHART#:[id number]  The patient returns today after having a right brachiocephalic arteriovenous fistula on July 20, 2010.  She is doing very well.  She has no steal symptoms.  She denies any fever or chills.  VITAL SIGNS TODAY:  Blood pressure is 145/60 with a mean of 81 in her left arm.  Her heart rate 85 and regular.  Her O2 saturation is 94% on room air.  Her incision is healing nicely and she does have a good thrill.  The patient is doing quite well.  We will have her follow up in the office with a duplex scan of her fistula in 6 weeks.  Newton Pigg, PA  Charles E. Fields, MD Electronically Signed  SE/MEDQ  D:  09/03/2010  T:  09/03/2010  Job:  [id number]

## 2010-09-07 ENCOUNTER — Encounter (HOSPITAL_COMMUNITY): Payer: Medicare Other

## 2010-09-07 ENCOUNTER — Other Ambulatory Visit: Payer: Self-pay | Admitting: Nephrology

## 2010-09-14 ENCOUNTER — Other Ambulatory Visit: Payer: Self-pay | Admitting: Nephrology

## 2010-09-14 ENCOUNTER — Encounter (HOSPITAL_COMMUNITY): Payer: Medicare Other

## 2010-09-14 ENCOUNTER — Ambulatory Visit
Admission: RE | Admit: 2010-09-14 | Discharge: 2010-09-14 | Disposition: A | Discharge status: Home / Self Care | Payer: Medicare Other | Source: Ambulatory Visit | Attending: Nephrology | Admitting: Nephrology

## 2010-09-14 DIAGNOSIS — R0602 Shortness of breath: Secondary | ICD-10-CM

## 2010-09-14 LAB — RENAL FUNCTION PANEL
Albumin: 3.6 g/dL (ref 3.5–5.2)
Chloride: 103 mEq/L (ref 96–112)
GFR calc Af Amer: 10 mL/min — ABNORMAL LOW (ref >60)
Glucose, Bld: 154 mg/dL — ABNORMAL HIGH (ref 70–99)
Phosphorus: 5 mg/dL — ABNORMAL HIGH (ref 2.3–4.6)
Potassium: 4.2 mEq/L (ref 3.5–5.1)
Sodium: 140 mEq/L (ref 135–145)

## 2010-09-14 LAB — MAGNESIUM: Magnesium: 2.4 mg/dL (ref 1.5–2.5)

## 2010-09-16 ENCOUNTER — Encounter (HOSPITAL_COMMUNITY): Payer: Medicare Other

## 2010-09-21 ENCOUNTER — Encounter (HOSPITAL_COMMUNITY): Payer: Medicare Other

## 2010-09-21 ENCOUNTER — Other Ambulatory Visit: Payer: Self-pay | Admitting: Nephrology

## 2010-09-22 LAB — POCT HEMOGLOBIN-HEMACUE: Hemoglobin: 11 g/dL — ABNORMAL LOW (ref 12.0–15.0)

## 2010-09-28 ENCOUNTER — Encounter (HOSPITAL_COMMUNITY): Payer: Medicare Other | Attending: Nephrology

## 2010-09-28 ENCOUNTER — Other Ambulatory Visit: Payer: Self-pay | Admitting: Nephrology

## 2010-09-28 DIAGNOSIS — N185 Chronic kidney disease, stage 5: Secondary | ICD-10-CM | POA: Insufficient documentation

## 2010-09-28 DIAGNOSIS — D638 Anemia in other chronic diseases classified elsewhere: Secondary | ICD-10-CM | POA: Insufficient documentation

## 2010-09-28 LAB — POCT HEMOGLOBIN-HEMACUE: Hemoglobin: 12 g/dL (ref 12.0–15.0)

## 2010-10-05 ENCOUNTER — Other Ambulatory Visit: Payer: Self-pay | Admitting: Nephrology

## 2010-10-05 ENCOUNTER — Encounter (HOSPITAL_COMMUNITY): Payer: Medicare Other

## 2010-10-05 LAB — IRON AND TIBC
Iron: 58 ug/dL (ref 42–135)
UIBC: 301 ug/dL

## 2010-10-05 LAB — RENAL FUNCTION PANEL
Albumin: 4.1 g/dL (ref 3.5–5.2)
Chloride: 102 mEq/L (ref 96–112)
GFR calc Af Amer: 9 mL/min — ABNORMAL LOW (ref >60)
GFR calc non Af Amer: 7 mL/min — ABNORMAL LOW (ref >60)
Potassium: 4.5 mEq/L (ref 3.5–5.1)

## 2010-10-05 LAB — FERRITIN: Ferritin: 501 ng/mL — ABNORMAL HIGH (ref 10–291)

## 2010-10-15 ENCOUNTER — Encounter (INDEPENDENT_AMBULATORY_CARE_PROVIDER_SITE_OTHER): Payer: Medicare Other

## 2010-10-15 DIAGNOSIS — Z48812 Encounter for surgical aftercare following surgery on the circulatory system: Secondary | ICD-10-CM

## 2010-10-15 DIAGNOSIS — N186 End stage renal disease: Secondary | ICD-10-CM

## 2010-10-19 ENCOUNTER — Other Ambulatory Visit: Payer: Self-pay | Admitting: Nephrology

## 2010-10-19 ENCOUNTER — Encounter (HOSPITAL_COMMUNITY): Payer: Medicare Other

## 2010-10-23 ENCOUNTER — Ambulatory Visit (INDEPENDENT_AMBULATORY_CARE_PROVIDER_SITE_OTHER): Payer: Medicare Other

## 2010-10-23 DIAGNOSIS — N186 End stage renal disease: Secondary | ICD-10-CM

## 2010-10-23 NOTE — Assessment & Plan Note (Signed)
OFFICE VISIT  [name], [name] A DOB:  [birth year]                                       10/23/2010 CHART#:[id number]  Date of surgery was 07/20/2010.  The patient returns today for continued followup and evaluation of right brachiocephalic fistula placed on 07/20/2010.  The patient has been doing very well and denies all signs and symptoms of steal.  She denies fevers and chills.  She was initially evaluated approximately 4 weeks postoperatively on 09/03/2010 at which time she was again doing well but her fistula had not completely matured.  Secondary to this she was instructed to follow up in an additional 6 weeks with a duplex scan of her fistula.  The patient's duplex was completed today 10/23/2010 and reveals a nicely developing right brachiocephalic fistula.  The depth ranges from 0.19 to 0.8 cm from the antecubital fossa to the brachium and the diameter of the fistula ranges from 0.73 at the antecubital fossa to 0.72 cm at the proximal brachium.  There is some increased velocity noted at the anastomosis but there is no internal narrowing.  There are 2 sizable branches in the middle portion of the fistula that if she were to have problems in the future consideration of ligation of these would be necessary.  PHYSICAL EXAM:  Vital signs:  Blood pressure 157/56 right arm sitting, O2 saturation 96% on room air, heart rate 62.  General:  This patient is well-nourished, in no acute distress.  Extremities:  The right upper extremity is warm and well-perfused.  Motor and sensation are intact. There is 2+ radial pulse.  There is an excellent thrill in the fistula. The incision is well-healed.  The patient is doing very well status post formation of right brachiocephalic fistula and this is maturing nicely.  The patient will follow up p.r.n.  She is not on dialysis yet.  She knows to call us if any questions, issues or problems in the interim.  [name], [place]  [name], MD Electronically Signed  AY/MEDQ  D:  10/23/2010  T:  10/23/2010  Job:  [id number]

## 2010-10-30 NOTE — Procedures (Unsigned)
VASCULAR LAB EXAM  INDICATION:  Six week followup duplex of right brachiocephalic AV fistula.  HISTORY: Diabetes: Cardiac: Hypertension:  EXAM:  Right upper arm AV fistula duplex.  IMPRESSION: 1. Patent right brachial to cephalic arteriovenous fistula with a peak     velocity of 536 cm/sec noted at the anastomosis site.  No internal     narrowing is noted within the lumen at this site with the velocity     appearing to be most likely due to a small lumen diameter. 2. The right radial artery flow appears triphasic and antegrade. 3. Depth, diameter, velocity and patent branch diameter measurements     are noted on the attached worksheet.  ___________________________________________ [name], MD  CH/MEDQ  D:  10/19/2010  T:  10/19/2010  Job:  [id number]

## 2010-11-02 ENCOUNTER — Other Ambulatory Visit: Payer: Self-pay | Admitting: Nephrology

## 2010-11-02 ENCOUNTER — Encounter (HOSPITAL_COMMUNITY): Payer: Medicare Other | Attending: Nephrology

## 2010-11-02 DIAGNOSIS — D638 Anemia in other chronic diseases classified elsewhere: Secondary | ICD-10-CM | POA: Insufficient documentation

## 2010-11-02 DIAGNOSIS — N185 Chronic kidney disease, stage 5: Secondary | ICD-10-CM | POA: Insufficient documentation

## 2010-11-02 LAB — RENAL FUNCTION PANEL
Albumin: 3.9 g/dL (ref 3.5–5.2)
BUN: 70 mg/dL — ABNORMAL HIGH (ref 6–23)
Phosphorus: 5 mg/dL — ABNORMAL HIGH (ref 2.3–4.6)
Potassium: 4.1 mEq/L (ref 3.5–5.1)
Sodium: 140 mEq/L (ref 135–145)

## 2010-11-02 LAB — MAGNESIUM: Magnesium: 2.4 mg/dL (ref 1.5–2.5)

## 2010-11-02 LAB — IRON AND TIBC
Saturation Ratios: 24 % (ref 20–55)
TIBC: 305 ug/dL (ref 250–470)

## 2010-11-16 ENCOUNTER — Other Ambulatory Visit: Payer: Self-pay | Admitting: Nephrology

## 2010-11-16 ENCOUNTER — Encounter (HOSPITAL_COMMUNITY): Payer: Medicare Other

## 2010-11-17 LAB — POCT HEMOGLOBIN-HEMACUE: Hemoglobin: 11.8 g/dL — ABNORMAL LOW (ref 12.0–15.0)

## 2010-12-01 ENCOUNTER — Other Ambulatory Visit: Payer: Self-pay | Admitting: Nephrology

## 2010-12-01 ENCOUNTER — Encounter (HOSPITAL_COMMUNITY): Payer: Medicare Other | Attending: Nephrology

## 2010-12-01 DIAGNOSIS — N185 Chronic kidney disease, stage 5: Secondary | ICD-10-CM | POA: Insufficient documentation

## 2010-12-01 DIAGNOSIS — D638 Anemia in other chronic diseases classified elsewhere: Secondary | ICD-10-CM | POA: Insufficient documentation

## 2010-12-01 LAB — RENAL FUNCTION PANEL
Calcium: 9.9 mg/dL (ref 8.4–10.5)
Creatinine, Ser: 5.54 mg/dL — ABNORMAL HIGH (ref 0.50–1.10)
GFR calc non Af Amer: 7 mL/min — ABNORMAL LOW (ref >60)
Potassium: 4.3 mEq/L (ref 3.5–5.1)
Sodium: 144 mEq/L (ref 135–145)

## 2010-12-01 LAB — IRON AND TIBC
Iron: 58 ug/dL (ref 42–135)
Saturation Ratios: 19 % — ABNORMAL LOW (ref 20–55)
TIBC: 310 ug/dL (ref 250–470)

## 2010-12-01 LAB — CBC
MCHC: 33 g/dL (ref 30.0–36.0)
RDW: 19.6 % — ABNORMAL HIGH (ref 11.5–15.5)

## 2010-12-01 LAB — FERRITIN: Ferritin: 181 ng/mL (ref 10–291)

## 2010-12-03 ENCOUNTER — Telehealth: Payer: Self-pay

## 2010-12-03 NOTE — Telephone Encounter (Signed)
Pt. called to report "having problems w/ lifting and using right arm".  S/p (R) Brachiocephalic AVF of 07/20/10.  States has "pain middle of arm in the muscle when I lift my purse"  Denies any redness or inflammation in arm.  Denies numbness/tingling.  States tips of all the fingers (R) hand get cool at times.  States can grip and pick up things with right hand without difficulty.  Advised will schedule appt. to have right arm/ AVF assessed. Verb. understanding.

## 2010-12-10 ENCOUNTER — Ambulatory Visit: Payer: Medicare Other

## 2010-12-14 ENCOUNTER — Encounter: Payer: Self-pay | Admitting: Physician Assistant

## 2010-12-14 ENCOUNTER — Other Ambulatory Visit: Payer: Self-pay | Admitting: Nephrology

## 2010-12-14 ENCOUNTER — Encounter (HOSPITAL_COMMUNITY): Payer: Medicare Other

## 2010-12-14 LAB — POCT HEMOGLOBIN-HEMACUE: Hemoglobin: 11 g/dL — ABNORMAL LOW (ref 12.0–15.0)

## 2010-12-15 ENCOUNTER — Encounter: Payer: Self-pay | Admitting: Physician Assistant

## 2010-12-16 ENCOUNTER — Ambulatory Visit (INDEPENDENT_AMBULATORY_CARE_PROVIDER_SITE_OTHER): Payer: Medicare Other | Admitting: Physician Assistant

## 2010-12-16 ENCOUNTER — Encounter: Payer: Self-pay | Admitting: Physician Assistant

## 2010-12-16 VITALS — BP 174/63 | HR 76 | Temp 98.2°F | Ht 60.0 in | Wt 132.0 lb

## 2010-12-16 DIAGNOSIS — M79609 Pain in unspecified limb: Secondary | ICD-10-CM

## 2010-12-16 DIAGNOSIS — M79603 Pain in arm, unspecified: Secondary | ICD-10-CM

## 2010-12-16 DIAGNOSIS — N186 End stage renal disease: Secondary | ICD-10-CM

## 2010-12-16 NOTE — Progress Notes (Signed)
[facility] [place]  Postoperative Access Visit  History of Present Illness  [name] is a 77 y.o. year old female who presents for eval of right upper arm discomfort. She is s/p right B-C AVF 07/20/10 by Dr. Darrick Penna.  She has been seen in f/u twice since that time and was without complaint on both occasions. She had a nicely developed AVF with no s/s of steal and was instructed to f/u prn.  She states that a week or so after her last eval she began to note some "soreness" along the medial and lateral aspect of her right UE that was initially tender to touch but without redness, skin breakdown, or bruising.  This resolved and now she only notes mild "soreness" with some position changes such as lifting her arm over her head.  The discomfort is mild and resolves quickly without intervention.  She is otherwise without complaint and states that the discomfort has continued to improve.  She continues to deny hand pain, numbness, and tingling.  She just "wanted to be sure there was nothing wrong with the fistula".     Past Medical History  Diagnosis Date  . Diabetes mellitus   . Hyperlipidemia   . Hypertension   . Hiatal hernia   . Chronic kidney disease   . Anxiety   . Cancer 2004    breast  . Facial droop    Past Surgical History  Procedure Date  . Cataract extraction 1981  . Eye surgery 1966  . Av fistula placement 07/20/10    Right brachiocephalic AVF    History  Substance Use Topics  . Smoking status: Former Smoker    Types: Cigarettes    Quit date: 03/29/1953  . Smokeless tobacco: Not on file  . Alcohol Use: No    Family History  Problem Relation Age of Onset  . Heart disease Brother     Allergies  Allergen Reactions  . Sulfa Antibiotics Rash   Current Outpatient Prescriptions  Medication Sig Dispense Refill  . amLODipine (NORVASC) 10 MG tablet Take 10 mg by mouth daily.        Marland Kitchen aspirin EC 81 MG tablet Take 81 mg by mouth daily.        Marland Kitchen  diltiazem (CARDIZEM) 60 MG tablet Take 60 mg by mouth 2 (two) times daily.        Marland Kitchen glyBURIDE (DIABETA) 5 MG tablet Take 5 mg by mouth daily with breakfast.        . Multiple Vitamins-Minerals (CENTRUM SILVER PO) Take by mouth daily.        . nebivolol (BYSTOLIC) 5 MG tablet Take 5 mg by mouth daily.        Marland Kitchen PARoxetine (PAXIL) 10 MG tablet Take 10 mg by mouth every morning.        . simvastatin (ZOCOR) 20 MG tablet Take 20 mg by mouth at bedtime.        . torsemide (DEMADEX) 10 MG tablet Take 10 mg by mouth daily.         ROS: See HPI for pertinent positives and negatives.  Otherwise, remains unchanged from previous visit.   Physical Examination  Filed Vitals:   12/16/10 1339  BP: 174/63  Pulse: 76  Temp: 98.2 F (36.8 C)   Right anticubital Incision is well healed, skin feels warm, hand grip is 5/5, sensation in digits is intact, AVF has a palpable thrill;  no evidence of erythema, ecchymosis, or skin breakdown  on right arm.  Arm is not tender to touch.  Pain is not reproduced with ROM exercises.    Medical Decision Making  [name] is a 77 y.o. year old female who presents for eval of right UE discomfort s/p right B-C AVF 07/20/10. Her discomfort continues to improve and resolve.  This is likely from nerve injury during procedure.  Pt is reassure this will continue to improve.  Her fistula is mature and has developed nicely.  She is not yet on HD but it can be used if needed.  She is instructed to contact us if her sx worsen or if she develops hand pain, numbness, or tingling.   F/U prn

## 2010-12-28 ENCOUNTER — Other Ambulatory Visit: Payer: Self-pay | Admitting: Nephrology

## 2010-12-28 ENCOUNTER — Encounter (HOSPITAL_COMMUNITY)
Admission: RE | Admit: 2010-12-28 | Discharge: 2010-12-28 | Disposition: A | Discharge status: Home / Self Care | Payer: Medicare Other | Source: Ambulatory Visit | Attending: Nephrology | Admitting: Nephrology

## 2010-12-28 DIAGNOSIS — N185 Chronic kidney disease, stage 5: Secondary | ICD-10-CM | POA: Insufficient documentation

## 2010-12-28 DIAGNOSIS — D638 Anemia in other chronic diseases classified elsewhere: Secondary | ICD-10-CM | POA: Insufficient documentation

## 2010-12-28 LAB — RENAL FUNCTION PANEL
BUN: 75 mg/dL — ABNORMAL HIGH (ref 6–23)
CO2: 23 mEq/L (ref 19–32)
Chloride: 99 mEq/L (ref 96–112)
GFR calc Af Amer: 7 mL/min — ABNORMAL LOW (ref >90)
Glucose, Bld: 184 mg/dL — ABNORMAL HIGH (ref 70–99)
Potassium: 4.8 mEq/L (ref 3.5–5.1)
Sodium: 135 mEq/L (ref 135–145)

## 2010-12-28 LAB — IRON AND TIBC
Iron: 64 ug/dL (ref 42–135)
Saturation Ratios: 21 % (ref 20–55)
TIBC: 311 ug/dL (ref 250–470)
UIBC: 247 ug/dL (ref 125–400)

## 2010-12-28 LAB — MAGNESIUM: Magnesium: 2.4 mg/dL (ref 1.5–2.5)

## 2011-01-11 ENCOUNTER — Other Ambulatory Visit: Payer: Self-pay | Admitting: Nephrology

## 2011-01-11 ENCOUNTER — Encounter (HOSPITAL_COMMUNITY): Payer: Medicare Other

## 2011-01-11 LAB — POCT HEMOGLOBIN-HEMACUE: Hemoglobin: 11.3 g/dL — ABNORMAL LOW (ref 12.0–15.0)

## 2011-01-25 ENCOUNTER — Other Ambulatory Visit: Payer: Self-pay | Admitting: Nephrology

## 2011-01-25 ENCOUNTER — Encounter (HOSPITAL_COMMUNITY)
Admission: RE | Admit: 2011-01-25 | Discharge: 2011-01-25 | Payer: Medicare Other | Source: Ambulatory Visit | Attending: Nephrology | Admitting: Nephrology

## 2011-01-25 LAB — RENAL FUNCTION PANEL
Albumin: 3.8 g/dL (ref 3.5–5.2)
BUN: 83 mg/dL — ABNORMAL HIGH (ref 6–23)
CO2: 23 mEq/L (ref 19–32)
Chloride: 103 mEq/L (ref 96–112)
Creatinine, Ser: 6.48 mg/dL — ABNORMAL HIGH (ref 0.50–1.10)
Potassium: 5.2 mEq/L — ABNORMAL HIGH (ref 3.5–5.1)

## 2011-01-25 LAB — POCT HEMOGLOBIN-HEMACUE: Hemoglobin: 11.4 g/dL — ABNORMAL LOW (ref 12.0–15.0)

## 2011-01-25 LAB — IRON AND TIBC
Iron: 43 ug/dL (ref 42–135)
Saturation Ratios: 14 % — ABNORMAL LOW (ref 20–55)
TIBC: 311 ug/dL (ref 250–470)

## 2011-02-08 ENCOUNTER — Encounter (HOSPITAL_COMMUNITY)
Admission: RE | Admit: 2011-02-08 | Discharge: 2011-02-08 | Disposition: A | Discharge status: Home / Self Care | Payer: Medicare Other | Source: Ambulatory Visit | Attending: Nephrology | Admitting: Nephrology

## 2011-02-08 DIAGNOSIS — N185 Chronic kidney disease, stage 5: Secondary | ICD-10-CM | POA: Insufficient documentation

## 2011-02-08 DIAGNOSIS — N184 Chronic kidney disease, stage 4 (severe): Secondary | ICD-10-CM

## 2011-02-08 DIAGNOSIS — D638 Anemia in other chronic diseases classified elsewhere: Secondary | ICD-10-CM | POA: Insufficient documentation

## 2011-02-08 LAB — RENAL FUNCTION PANEL
Albumin: 3.3 g/dL — ABNORMAL LOW (ref 3.5–5.2)
Calcium: 8.9 mg/dL (ref 8.4–10.5)
Creatinine, Ser: 7.17 mg/dL — ABNORMAL HIGH (ref 0.50–1.10)
GFR calc non Af Amer: 5 mL/min — ABNORMAL LOW (ref >90)

## 2011-02-08 LAB — POCT HEMOGLOBIN-HEMACUE: Hemoglobin: 11.1 g/dL — ABNORMAL LOW (ref 12.0–15.0)

## 2011-02-08 MED ORDER — EPOETIN ALFA 10000 UNIT/ML IJ SOLN
10000.0000 [IU] | Freq: Once | INTRAMUSCULAR | Status: AC
  Administered 2011-02-08: 10000 [IU] via SUBCUTANEOUS

## 2011-02-08 MED ORDER — EPOETIN ALFA 10000 UNIT/ML IJ SOLN
INTRAMUSCULAR | Status: AC
  Filled 2011-02-08: qty 1

## 2011-02-22 ENCOUNTER — Other Ambulatory Visit (HOSPITAL_COMMUNITY): Payer: Self-pay | Admitting: *Deleted

## 2011-02-23 ENCOUNTER — Encounter (HOSPITAL_COMMUNITY): Payer: Medicare Other

## 2011-02-23 ENCOUNTER — Encounter (HOSPITAL_COMMUNITY)
Admission: RE | Admit: 2011-02-23 | Discharge: 2011-02-23 | Disposition: A | Discharge status: Home / Self Care | Payer: Medicare Other | Source: Ambulatory Visit | Attending: Nephrology | Admitting: Nephrology

## 2011-02-23 DIAGNOSIS — N184 Chronic kidney disease, stage 4 (severe): Secondary | ICD-10-CM

## 2011-02-23 LAB — RENAL FUNCTION PANEL
CO2: 18 mEq/L — ABNORMAL LOW (ref 19–32)
Chloride: 100 mEq/L (ref 96–112)
GFR calc Af Amer: 5 mL/min — ABNORMAL LOW (ref >90)
GFR calc non Af Amer: 4 mL/min — ABNORMAL LOW (ref >90)
Sodium: 136 mEq/L (ref 135–145)

## 2011-02-23 LAB — POCT HEMOGLOBIN-HEMACUE: Hemoglobin: 11 g/dL — ABNORMAL LOW (ref 12.0–15.0)

## 2011-02-23 MED ORDER — FERUMOXYTOL INJECTION 510 MG/17 ML
510.0000 mg | INTRAVENOUS | Status: DC
  Administered 2011-02-23: 510 mg via INTRAVENOUS

## 2011-02-23 MED ORDER — EPOETIN ALFA 10000 UNIT/ML IJ SOLN
10000.0000 [IU] | INTRAMUSCULAR | Status: DC
  Administered 2011-02-23: 10000 [IU] via SUBCUTANEOUS

## 2011-02-23 MED ORDER — EPOETIN ALFA 10000 UNIT/ML IJ SOLN
INTRAMUSCULAR | Status: AC
  Administered 2011-02-23: 10000 [IU] via SUBCUTANEOUS
  Filled 2011-02-23: qty 1

## 2011-02-23 MED ORDER — FERUMOXYTOL INJECTION 510 MG/17 ML
INTRAVENOUS | Status: AC
  Administered 2011-02-23: 510 mg via INTRAVENOUS
  Filled 2011-02-23: qty 17

## 2011-03-01 ENCOUNTER — Encounter (HOSPITAL_COMMUNITY)
Admission: RE | Admit: 2011-03-01 | Discharge: 2011-03-01 | Disposition: A | Discharge status: Home / Self Care | Payer: Medicare Other | Source: Ambulatory Visit | Attending: Nephrology | Admitting: Nephrology

## 2011-03-01 DIAGNOSIS — N184 Chronic kidney disease, stage 4 (severe): Secondary | ICD-10-CM | POA: Insufficient documentation

## 2011-03-01 MED ORDER — FERUMOXYTOL INJECTION 510 MG/17 ML: 510.0000 mg | INTRAVENOUS | Status: DC

## 2011-03-01 MED ORDER — FERUMOXYTOL INJECTION 510 MG/17 ML
INTRAVENOUS | Status: AC
  Administered 2011-03-01: 510 mg via INTRAVENOUS
  Filled 2011-03-01: qty 17

## 2011-03-08 ENCOUNTER — Encounter (HOSPITAL_COMMUNITY)
Admission: RE | Admit: 2011-03-08 | Discharge: 2011-03-08 | Disposition: A | Discharge status: Home / Self Care | Payer: Medicare Other | Source: Ambulatory Visit | Attending: Nephrology | Admitting: Nephrology

## 2011-03-08 DIAGNOSIS — N184 Chronic kidney disease, stage 4 (severe): Secondary | ICD-10-CM

## 2011-03-08 LAB — RENAL FUNCTION PANEL
Albumin: 2.9 g/dL — ABNORMAL LOW (ref 3.5–5.2)
BUN: 121 mg/dL — ABNORMAL HIGH (ref 6–23)
Chloride: 101 mEq/L (ref 96–112)
GFR calc Af Amer: 4 mL/min — ABNORMAL LOW (ref >90)
Glucose, Bld: 124 mg/dL — ABNORMAL HIGH (ref 70–99)
Potassium: 3.1 mEq/L — ABNORMAL LOW (ref 3.5–5.1)

## 2011-03-08 LAB — IRON AND TIBC: Iron: 64 ug/dL (ref 42–135)

## 2011-03-08 LAB — FERRITIN: Ferritin: 804 ng/mL — ABNORMAL HIGH (ref 10–291)

## 2011-03-08 LAB — POCT HEMOGLOBIN-HEMACUE: Hemoglobin: 11.4 g/dL — ABNORMAL LOW (ref 12.0–15.0)

## 2011-03-08 MED ORDER — EPOETIN ALFA 10000 UNIT/ML IJ SOLN
INTRAMUSCULAR | Status: AC
  Administered 2011-03-08: 10000 [IU] via SUBCUTANEOUS
  Filled 2011-03-08: qty 1

## 2011-03-08 MED ORDER — EPOETIN ALFA 10000 UNIT/ML IJ SOLN
10000.0000 [IU] | INTRAMUSCULAR | Status: DC
  Administered 2011-03-08: 10000 [IU] via SUBCUTANEOUS

## 2011-03-16 ENCOUNTER — Other Ambulatory Visit (HOSPITAL_COMMUNITY): Payer: Self-pay | Admitting: Nephrology

## 2011-03-16 DIAGNOSIS — N186 End stage renal disease: Secondary | ICD-10-CM

## 2011-03-17 ENCOUNTER — Other Ambulatory Visit: Payer: Self-pay | Admitting: Radiology

## 2011-03-18 ENCOUNTER — Ambulatory Visit (HOSPITAL_COMMUNITY): Payer: Medicare Other

## 2011-03-22 ENCOUNTER — Encounter (HOSPITAL_COMMUNITY): Payer: Medicare Other

## 2011-03-29 ENCOUNTER — Other Ambulatory Visit: Payer: Self-pay

## 2011-03-31 DIAGNOSIS — E119 Type 2 diabetes mellitus without complications: Secondary | ICD-10-CM | POA: Diagnosis not present

## 2011-03-31 DIAGNOSIS — D509 Iron deficiency anemia, unspecified: Secondary | ICD-10-CM | POA: Diagnosis not present

## 2011-03-31 DIAGNOSIS — N186 End stage renal disease: Secondary | ICD-10-CM | POA: Diagnosis not present

## 2011-03-31 DIAGNOSIS — D631 Anemia in chronic kidney disease: Secondary | ICD-10-CM | POA: Diagnosis not present

## 2011-03-31 DIAGNOSIS — Z23 Encounter for immunization: Secondary | ICD-10-CM | POA: Diagnosis not present

## 2011-03-31 DIAGNOSIS — Z992 Dependence on renal dialysis: Secondary | ICD-10-CM | POA: Diagnosis not present

## 2011-04-01 DIAGNOSIS — Z992 Dependence on renal dialysis: Secondary | ICD-10-CM | POA: Diagnosis not present

## 2011-04-01 DIAGNOSIS — E119 Type 2 diabetes mellitus without complications: Secondary | ICD-10-CM | POA: Diagnosis not present

## 2011-04-01 DIAGNOSIS — D631 Anemia in chronic kidney disease: Secondary | ICD-10-CM | POA: Diagnosis not present

## 2011-04-01 DIAGNOSIS — Z23 Encounter for immunization: Secondary | ICD-10-CM | POA: Diagnosis not present

## 2011-04-01 DIAGNOSIS — N186 End stage renal disease: Secondary | ICD-10-CM | POA: Diagnosis not present

## 2011-04-03 DIAGNOSIS — Z23 Encounter for immunization: Secondary | ICD-10-CM | POA: Diagnosis not present

## 2011-04-03 DIAGNOSIS — E119 Type 2 diabetes mellitus without complications: Secondary | ICD-10-CM | POA: Diagnosis not present

## 2011-04-03 DIAGNOSIS — N039 Chronic nephritic syndrome with unspecified morphologic changes: Secondary | ICD-10-CM | POA: Diagnosis not present

## 2011-04-03 DIAGNOSIS — Z992 Dependence on renal dialysis: Secondary | ICD-10-CM | POA: Diagnosis not present

## 2011-04-03 DIAGNOSIS — N186 End stage renal disease: Secondary | ICD-10-CM | POA: Diagnosis not present

## 2011-04-06 DIAGNOSIS — Z992 Dependence on renal dialysis: Secondary | ICD-10-CM | POA: Diagnosis not present

## 2011-04-06 DIAGNOSIS — E119 Type 2 diabetes mellitus without complications: Secondary | ICD-10-CM | POA: Diagnosis not present

## 2011-04-06 DIAGNOSIS — N039 Chronic nephritic syndrome with unspecified morphologic changes: Secondary | ICD-10-CM | POA: Diagnosis not present

## 2011-04-06 DIAGNOSIS — Z23 Encounter for immunization: Secondary | ICD-10-CM | POA: Diagnosis not present

## 2011-04-06 DIAGNOSIS — N186 End stage renal disease: Secondary | ICD-10-CM | POA: Diagnosis not present

## 2011-04-08 DIAGNOSIS — N039 Chronic nephritic syndrome with unspecified morphologic changes: Secondary | ICD-10-CM | POA: Diagnosis not present

## 2011-04-08 DIAGNOSIS — N186 End stage renal disease: Secondary | ICD-10-CM | POA: Diagnosis not present

## 2011-04-08 DIAGNOSIS — Z992 Dependence on renal dialysis: Secondary | ICD-10-CM | POA: Diagnosis not present

## 2011-04-08 DIAGNOSIS — Z23 Encounter for immunization: Secondary | ICD-10-CM | POA: Diagnosis not present

## 2011-04-08 DIAGNOSIS — E119 Type 2 diabetes mellitus without complications: Secondary | ICD-10-CM | POA: Diagnosis not present

## 2011-04-10 DIAGNOSIS — D631 Anemia in chronic kidney disease: Secondary | ICD-10-CM | POA: Diagnosis not present

## 2011-04-10 DIAGNOSIS — Z23 Encounter for immunization: Secondary | ICD-10-CM | POA: Diagnosis not present

## 2011-04-10 DIAGNOSIS — Z992 Dependence on renal dialysis: Secondary | ICD-10-CM | POA: Diagnosis not present

## 2011-04-10 DIAGNOSIS — E119 Type 2 diabetes mellitus without complications: Secondary | ICD-10-CM | POA: Diagnosis not present

## 2011-04-10 DIAGNOSIS — N039 Chronic nephritic syndrome with unspecified morphologic changes: Secondary | ICD-10-CM | POA: Diagnosis not present

## 2011-04-10 DIAGNOSIS — N186 End stage renal disease: Secondary | ICD-10-CM | POA: Diagnosis not present

## 2011-04-13 DIAGNOSIS — D631 Anemia in chronic kidney disease: Secondary | ICD-10-CM | POA: Diagnosis not present

## 2011-04-13 DIAGNOSIS — N186 End stage renal disease: Secondary | ICD-10-CM | POA: Diagnosis not present

## 2011-04-13 DIAGNOSIS — N039 Chronic nephritic syndrome with unspecified morphologic changes: Secondary | ICD-10-CM | POA: Diagnosis not present

## 2011-04-13 DIAGNOSIS — Z992 Dependence on renal dialysis: Secondary | ICD-10-CM | POA: Diagnosis not present

## 2011-04-13 DIAGNOSIS — Z23 Encounter for immunization: Secondary | ICD-10-CM | POA: Diagnosis not present

## 2011-04-13 DIAGNOSIS — E119 Type 2 diabetes mellitus without complications: Secondary | ICD-10-CM | POA: Diagnosis not present

## 2011-04-14 ENCOUNTER — Encounter: Payer: Self-pay | Admitting: Vascular Surgery

## 2011-04-15 ENCOUNTER — Encounter: Payer: Self-pay | Admitting: Vascular Surgery

## 2011-04-15 ENCOUNTER — Ambulatory Visit (INDEPENDENT_AMBULATORY_CARE_PROVIDER_SITE_OTHER): Payer: Medicare Other | Admitting: Vascular Surgery

## 2011-04-15 ENCOUNTER — Encounter (HOSPITAL_COMMUNITY): Payer: Self-pay | Admitting: Pharmacy Technician

## 2011-04-15 ENCOUNTER — Other Ambulatory Visit: Payer: Self-pay | Admitting: *Deleted

## 2011-04-15 VITALS — BP 150/58 | HR 66 | Temp 97.7°F | Resp 12 | Ht 60.0 in | Wt 121.3 lb

## 2011-04-15 DIAGNOSIS — Z992 Dependence on renal dialysis: Secondary | ICD-10-CM | POA: Diagnosis not present

## 2011-04-15 DIAGNOSIS — T82898A Other specified complication of vascular prosthetic devices, implants and grafts, initial encounter: Secondary | ICD-10-CM | POA: Diagnosis not present

## 2011-04-15 DIAGNOSIS — N186 End stage renal disease: Secondary | ICD-10-CM | POA: Diagnosis not present

## 2011-04-15 DIAGNOSIS — D631 Anemia in chronic kidney disease: Secondary | ICD-10-CM | POA: Diagnosis not present

## 2011-04-15 DIAGNOSIS — Z23 Encounter for immunization: Secondary | ICD-10-CM | POA: Diagnosis not present

## 2011-04-15 DIAGNOSIS — E119 Type 2 diabetes mellitus without complications: Secondary | ICD-10-CM | POA: Diagnosis not present

## 2011-04-15 NOTE — Progress Notes (Signed)
Right UE AVF duplex performed @ [facility] 04/15/2011

## 2011-04-15 NOTE — Progress Notes (Signed)
[facility] [place] HISTORY AND PHYSICAL    History of Present Illness:  Patient is a 78 y.o. year old female who presents for evaluation of her right brachiocephalic AV fistula. She has had difficulty cannulating this. It sometimes takes up to 45 minutes to get her on dialysis. The fistula was placed in April 2012. She denies any symptoms of steal. Her current dialysis today is Tuesday Thursday and Saturday.   Physical Examination  Filed Vitals:   04/15/11 1138  BP: 150/58  Pulse: 66  Temp: 97.7 F (36.5 C)  Resp: 12    Body mass index is 23.68 kg/(m^2).  General:  Alert and oriented, no acute distress Neck: No bruit or JVD Skin: No rash Extremities:  Right upper extremity fistula with palpable thrill in several areas of ecchymosis, 1-2+ right radial pulse Neurologic: Upper and lower extremity motor 5/5 and symmetric  DATA: Duplex ultrasound of the right AV fistula was performed today. This shows a duplicated system at the proximal aspect of the fistula. The diameter of the fistula is between 5 and 9 mm. There is no obvious area of narrowing. There were some prominent valves in the distal portion the fistula.   ASSESSMENT: Poorly developed right upper arm AV fistula.   PLAN: We will schedule her for right arm fistulogram tomorrow. Possible angioplasty. If we do not see anything that is fixable on the fistulogram we will schedule her for elective conversion of this to an AV graft. Risks benefits possible palpitations and procedure details were trying to the patient today and she agrees to proceed.   Fabienne Bruns, MD [facility] [place] Office: [phone] Pager: [phone]

## 2011-04-16 ENCOUNTER — Other Ambulatory Visit: Payer: Self-pay

## 2011-04-16 ENCOUNTER — Ambulatory Visit (HOSPITAL_COMMUNITY)
Admission: RE | Admit: 2011-04-16 | Discharge: 2011-04-16 | Disposition: A | Discharge status: Home / Self Care | Payer: Medicare Other | Source: Ambulatory Visit | Attending: Vascular Surgery | Admitting: Vascular Surgery

## 2011-04-16 ENCOUNTER — Encounter (HOSPITAL_COMMUNITY)
Admission: RE | Disposition: A | Discharge status: Home / Self Care | Payer: Self-pay | Source: Ambulatory Visit | Attending: Vascular Surgery

## 2011-04-16 DIAGNOSIS — Z992 Dependence on renal dialysis: Secondary | ICD-10-CM | POA: Insufficient documentation

## 2011-04-16 DIAGNOSIS — T82898A Other specified complication of vascular prosthetic devices, implants and grafts, initial encounter: Secondary | ICD-10-CM | POA: Diagnosis not present

## 2011-04-16 DIAGNOSIS — N186 End stage renal disease: Secondary | ICD-10-CM | POA: Insufficient documentation

## 2011-04-16 DIAGNOSIS — T82598A Other mechanical complication of other cardiac and vascular devices and implants, initial encounter: Secondary | ICD-10-CM | POA: Insufficient documentation

## 2011-04-16 DIAGNOSIS — Y832 Surgical operation with anastomosis, bypass or graft as the cause of abnormal reaction of the patient, or of later complication, without mention of misadventure at the time of the procedure: Secondary | ICD-10-CM | POA: Insufficient documentation

## 2011-04-16 HISTORY — PX: SHUNTOGRAM: SHX5510

## 2011-04-16 LAB — APTT: aPTT: 33 seconds (ref 24–37)

## 2011-04-16 LAB — CBC
HCT: 34.5 % — ABNORMAL LOW (ref 36.0–46.0)
Hemoglobin: 11.3 g/dL — ABNORMAL LOW (ref 12.0–15.0)
RBC: 3.83 MIL/uL — ABNORMAL LOW (ref 3.87–5.11)
WBC: 7.4 10*3/uL (ref 4.0–10.5)

## 2011-04-16 LAB — BASIC METABOLIC PANEL
Chloride: 102 mEq/L (ref 96–112)
GFR calc Af Amer: 18 mL/min — ABNORMAL LOW (ref >90)
Potassium: 3.7 mEq/L (ref 3.5–5.1)

## 2011-04-16 LAB — PROTIME-INR: INR: 0.94 (ref 0.00–1.49)

## 2011-04-16 LAB — GLUCOSE, CAPILLARY: Glucose-Capillary: 91 mg/dL (ref 70–99)

## 2011-04-16 SURGERY — SHUNTOGRAM
Anesthesia: LOCAL

## 2011-04-16 MED ORDER — DOCUSATE SODIUM 100 MG PO CAPS: 100.0000 mg | ORAL_CAPSULE | Freq: Every day | ORAL | Status: DC

## 2011-04-16 MED ORDER — HYDRALAZINE HCL 20 MG/ML IJ SOLN: 10.0000 mg | INTRAMUSCULAR | Status: DC | PRN

## 2011-04-16 MED ORDER — CEFAZOLIN SODIUM 1-5 GM-% IV SOLN: 1.0000 g | Freq: Once | INTRAVENOUS | Status: DC

## 2011-04-16 MED ORDER — SODIUM CHLORIDE 0.9 % IJ SOLN: 3.0000 mL | INTRAMUSCULAR | Status: DC | PRN

## 2011-04-16 MED ORDER — DOPAMINE-DEXTROSE 3.2-5 MG/ML-% IV SOLN: 3.0000 ug/kg/min | INTRAVENOUS | Status: DC

## 2011-04-16 MED ORDER — HEPARIN (PORCINE) IN NACL 2-0.9 UNIT/ML-% IJ SOLN
INTRAMUSCULAR | Status: AC
  Filled 2011-04-16: qty 1000

## 2011-04-16 MED ORDER — LABETALOL HCL 5 MG/ML IV SOLN: 10.0000 mg | INTRAVENOUS | Status: DC | PRN

## 2011-04-16 MED ORDER — SODIUM CHLORIDE 0.9 % IV SOLN: 500.0000 mL | Freq: Once | INTRAVENOUS | Status: DC | PRN

## 2011-04-16 MED ORDER — LIDOCAINE HCL (PF) 1 % IJ SOLN
INTRAMUSCULAR | Status: AC
  Filled 2011-04-16: qty 30

## 2011-04-16 MED ORDER — ACETAMINOPHEN 325 MG RE SUPP: 325.0000 mg | RECTAL | Status: DC | PRN

## 2011-04-16 MED ORDER — SODIUM CHLORIDE 0.9 % IV SOLN: INTRAVENOUS | Status: DC

## 2011-04-16 MED ORDER — METOPROLOL TARTRATE 1 MG/ML IV SOLN: 2.0000 mg | INTRAVENOUS | Status: DC | PRN

## 2011-04-16 MED ORDER — GUAIFENESIN-DM 100-10 MG/5ML PO SYRP: 15.0000 mL | ORAL_SOLUTION | ORAL | Status: DC | PRN

## 2011-04-16 MED ORDER — ACETAMINOPHEN 325 MG PO TABS: 325.0000 mg | ORAL_TABLET | ORAL | Status: DC | PRN

## 2011-04-16 MED ORDER — PHENOL 1.4 % MT LIQD: 1.0000 | OROMUCOSAL | Status: DC | PRN

## 2011-04-16 MED ORDER — ONDANSETRON HCL 4 MG/2ML IJ SOLN: 4.0000 mg | Freq: Four times a day (QID) | INTRAMUSCULAR | Status: DC | PRN

## 2011-04-16 NOTE — Op Note (Signed)
Procedure: Right brachial cephalic fistulogram  Preoperative diagnosis: Non-maturing AV fistula  Postoperative diagnosis: Same  Anesthesia: Local  Operative details: After obtaining informed consent, the patient was taken to the PV lab. The patient was placed in supine position on the Angio table. Entire right upper extremity was prepped and draped in usual sterile fashion. Local anesthesia was infiltrated over the midportion of the upper arm and the location of the fistula. Ultrasound was used to identify the fistula. A micropuncture needle was brought up on the operative field and this was used to directly cannulate the fistula using ultrasound guidance. A micropuncture wire was then threaded into the fistula and the micropuncture sheath threaded over this. Sheath was thoroughly flushed with heparinized saline and the dilator was removed. Contrast angiogram was then performed of the fistula.  The central venous structures are patent. There is a large competing branch in the mid portion of the fisuta which is of similar diameter to the main channel of the fistula.  There is a hypertrophied valve approximately 2-3 cm from the anastomosis causing approximately 80% stenosis. However, this was with retrograde flow as compression was held to reflux back across the anastomosis.  The arterial anastomosis is patent without narrowing.  The sheath was removed and hemostasis obtained with direct pressure. The patient tolerated the procedure well and there were no complications. The patient was taken to the holding area in stable condition.  Operative findings: Hypertrophied proximal valve of questionable clinical significance as this may not be obstructive with antegrade flow, large competing branch mid fistula  We will schedule the patient for fistula revision in the near future.  Fabienne Bruns, MD [facility] [place] Office: [phone] Pager: [phone]

## 2011-04-16 NOTE — Interval H&P Note (Signed)
History and Physical Interval Note:  04/16/2011 7:16 AM  [name]  has presented today for surgery, with the diagnosis of instage renal  The various methods of treatment have been discussed with the patient and family. After consideration of risks, benefits and other options for treatment, the patient has consented to  Procedure(s): SHUNTOGRAM as a surgical intervention .  The patients' history has been reviewed, patient examined, no change in status, stable for surgery.  I have reviewed the patients' chart and labs.  Questions were answered to the patient's satisfaction.     [name] E

## 2011-04-16 NOTE — H&P (View-Only) (Signed)
[facility] [place] HISTORY AND PHYSICAL    History of Present Illness:  Patient is a 78 y.o. year old female who presents for evaluation of her right brachiocephalic AV fistula. She has had difficulty cannulating this. It sometimes takes up to 45 minutes to get her on dialysis. The fistula was placed in April 2012. She denies any symptoms of steal. Her current dialysis today is Tuesday Thursday and Saturday.   Physical Examination  Filed Vitals:   04/15/11 1138  BP: 150/58  Pulse: 66  Temp: 97.7 F (36.5 C)  Resp: 12    Body mass index is 23.68 kg/(m^2).  General:  Alert and oriented, no acute distress Neck: No bruit or JVD Skin: No rash Extremities:  Right upper extremity fistula with palpable thrill in several areas of ecchymosis, 1-2+ right radial pulse Neurologic: Upper and lower extremity motor 5/5 and symmetric  DATA: Duplex ultrasound of the right AV fistula was performed today. This shows a duplicated system at the proximal aspect of the fistula. The diameter of the fistula is between 5 and 9 mm. There is no obvious area of narrowing. There were some prominent valves in the distal portion the fistula.   ASSESSMENT: Poorly developed right upper arm AV fistula.   PLAN: We will schedule her for right arm fistulogram tomorrow. Possible angioplasty. If we do not see anything that is fixable on the fistulogram we will schedule her for elective conversion of this to an AV graft. Risks benefits possible palpitations and procedure details were trying to the patient today and she agrees to proceed.   Esterlene Atiyeh, MD [facility] [place] Office: [phone] Pager: [phone] 

## 2011-04-17 DIAGNOSIS — E119 Type 2 diabetes mellitus without complications: Secondary | ICD-10-CM | POA: Diagnosis not present

## 2011-04-17 DIAGNOSIS — D631 Anemia in chronic kidney disease: Secondary | ICD-10-CM | POA: Diagnosis not present

## 2011-04-17 DIAGNOSIS — Z23 Encounter for immunization: Secondary | ICD-10-CM | POA: Diagnosis not present

## 2011-04-17 DIAGNOSIS — Z992 Dependence on renal dialysis: Secondary | ICD-10-CM | POA: Diagnosis not present

## 2011-04-17 DIAGNOSIS — N186 End stage renal disease: Secondary | ICD-10-CM | POA: Diagnosis not present

## 2011-04-19 ENCOUNTER — Other Ambulatory Visit: Payer: Self-pay | Admitting: *Deleted

## 2011-04-20 DIAGNOSIS — Z992 Dependence on renal dialysis: Secondary | ICD-10-CM | POA: Diagnosis not present

## 2011-04-20 DIAGNOSIS — D631 Anemia in chronic kidney disease: Secondary | ICD-10-CM | POA: Diagnosis not present

## 2011-04-20 DIAGNOSIS — E119 Type 2 diabetes mellitus without complications: Secondary | ICD-10-CM | POA: Diagnosis not present

## 2011-04-20 DIAGNOSIS — N186 End stage renal disease: Secondary | ICD-10-CM | POA: Diagnosis not present

## 2011-04-20 DIAGNOSIS — Z23 Encounter for immunization: Secondary | ICD-10-CM | POA: Diagnosis not present

## 2011-04-22 DIAGNOSIS — Z23 Encounter for immunization: Secondary | ICD-10-CM | POA: Diagnosis not present

## 2011-04-22 DIAGNOSIS — D631 Anemia in chronic kidney disease: Secondary | ICD-10-CM | POA: Diagnosis not present

## 2011-04-22 DIAGNOSIS — E119 Type 2 diabetes mellitus without complications: Secondary | ICD-10-CM | POA: Diagnosis not present

## 2011-04-22 DIAGNOSIS — N186 End stage renal disease: Secondary | ICD-10-CM | POA: Diagnosis not present

## 2011-04-22 DIAGNOSIS — N039 Chronic nephritic syndrome with unspecified morphologic changes: Secondary | ICD-10-CM | POA: Diagnosis not present

## 2011-04-22 DIAGNOSIS — Z992 Dependence on renal dialysis: Secondary | ICD-10-CM | POA: Diagnosis not present

## 2011-04-22 NOTE — Procedures (Unsigned)
VASCULAR LAB EXAM  INDICATION:  Malfunctioning arteriovenous fistula with complications.  HISTORY: Diabetes:  Yes. Cardiac: Hypertension:  Yes.  EXAM:  Right brachiocephalic arteriovenous fistula duplex.  IMPRESSION: 1. Patent antegrade arterial inflow and outflow. 2. Elevated velocity present involving the fistula anastomosis with     peak systolic velocity of 655 cm/s, suggestive of stenosis. 3. Distal upper arm venous outflow presents with a competing branch     originating at the arterial anastomosis and rejoining at the mid     venous outflow segment. 4. Small hematoma present at the mid upper arm measuring 0.65 cm X     1.44 cm in diameter. 5. Calcified valve leaflets present at the proximal upper arm venous     outflow without hemodynamic changes evident. 6. Depths and diameters are as noted on worksheet.  ___________________________________________ [name], MD  SH/MEDQ  D:  04/15/2011  T:  04/15/2011  Job:  [id number]

## 2011-04-24 DIAGNOSIS — Z992 Dependence on renal dialysis: Secondary | ICD-10-CM | POA: Diagnosis not present

## 2011-04-24 DIAGNOSIS — Z23 Encounter for immunization: Secondary | ICD-10-CM | POA: Diagnosis not present

## 2011-04-24 DIAGNOSIS — N039 Chronic nephritic syndrome with unspecified morphologic changes: Secondary | ICD-10-CM | POA: Diagnosis not present

## 2011-04-24 DIAGNOSIS — N186 End stage renal disease: Secondary | ICD-10-CM | POA: Diagnosis not present

## 2011-04-24 DIAGNOSIS — E119 Type 2 diabetes mellitus without complications: Secondary | ICD-10-CM | POA: Diagnosis not present

## 2011-04-27 ENCOUNTER — Encounter (HOSPITAL_COMMUNITY): Payer: Self-pay

## 2011-04-27 DIAGNOSIS — N186 End stage renal disease: Secondary | ICD-10-CM | POA: Diagnosis not present

## 2011-04-27 DIAGNOSIS — Z23 Encounter for immunization: Secondary | ICD-10-CM | POA: Diagnosis not present

## 2011-04-27 DIAGNOSIS — Z992 Dependence on renal dialysis: Secondary | ICD-10-CM | POA: Diagnosis not present

## 2011-04-27 DIAGNOSIS — N039 Chronic nephritic syndrome with unspecified morphologic changes: Secondary | ICD-10-CM | POA: Diagnosis not present

## 2011-04-27 DIAGNOSIS — E119 Type 2 diabetes mellitus without complications: Secondary | ICD-10-CM | POA: Diagnosis not present

## 2011-04-27 MED ORDER — CEFAZOLIN SODIUM 1-5 GM-% IV SOLN
1.0000 g | Freq: Once | INTRAVENOUS | Status: AC
  Administered 2011-04-28: 1 g via INTRAVENOUS
  Filled 2011-04-27: qty 50

## 2011-04-27 MED ORDER — SODIUM CHLORIDE 0.9 % IV SOLN: INTRAVENOUS | Status: DC

## 2011-04-28 ENCOUNTER — Ambulatory Visit (HOSPITAL_COMMUNITY)
Admission: RE | Admit: 2011-04-28 | Discharge: 2011-04-28 | Disposition: A | Discharge status: Home / Self Care | Payer: Medicare Other | Source: Ambulatory Visit | Attending: Vascular Surgery | Admitting: Vascular Surgery

## 2011-04-28 ENCOUNTER — Ambulatory Visit (HOSPITAL_COMMUNITY): Payer: Medicare Other | Admitting: Anesthesiology

## 2011-04-28 ENCOUNTER — Encounter (HOSPITAL_COMMUNITY): Payer: Self-pay | Admitting: *Deleted

## 2011-04-28 ENCOUNTER — Encounter (HOSPITAL_COMMUNITY)
Admission: RE | Disposition: A | Discharge status: Home / Self Care | Payer: Self-pay | Source: Ambulatory Visit | Attending: Vascular Surgery

## 2011-04-28 ENCOUNTER — Encounter (HOSPITAL_COMMUNITY): Payer: Self-pay | Admitting: Anesthesiology

## 2011-04-28 DIAGNOSIS — F411 Generalized anxiety disorder: Secondary | ICD-10-CM | POA: Insufficient documentation

## 2011-04-28 DIAGNOSIS — I120 Hypertensive chronic kidney disease with stage 5 chronic kidney disease or end stage renal disease: Secondary | ICD-10-CM | POA: Insufficient documentation

## 2011-04-28 DIAGNOSIS — N186 End stage renal disease: Secondary | ICD-10-CM | POA: Diagnosis not present

## 2011-04-28 DIAGNOSIS — T82898A Other specified complication of vascular prosthetic devices, implants and grafts, initial encounter: Secondary | ICD-10-CM | POA: Insufficient documentation

## 2011-04-28 DIAGNOSIS — Z992 Dependence on renal dialysis: Secondary | ICD-10-CM | POA: Diagnosis not present

## 2011-04-28 DIAGNOSIS — E119 Type 2 diabetes mellitus without complications: Secondary | ICD-10-CM | POA: Diagnosis not present

## 2011-04-28 HISTORY — DX: Unspecified osteoarthritis, unspecified site: M19.90

## 2011-04-28 LAB — PROTIME-INR
INR: 0.93 (ref 0.00–1.49)
Prothrombin Time: 12.7 seconds (ref 11.6–15.2)

## 2011-04-28 LAB — APTT: aPTT: 28 seconds (ref 24–37)

## 2011-04-28 LAB — SURGICAL PCR SCREEN: MRSA, PCR: NEGATIVE

## 2011-04-28 SURGERY — REVISON OF ARTERIOVENOUS FISTULA
Anesthesia: Monitor Anesthesia Care | Site: Arm Upper | Laterality: Right | Wound class: Clean

## 2011-04-28 MED ORDER — MEPERIDINE HCL 25 MG/ML IJ SOLN: 6.2500 mg | INTRAMUSCULAR | Status: DC | PRN

## 2011-04-28 MED ORDER — HEPARIN SODIUM (PORCINE) 1000 UNIT/ML IJ SOLN
INTRAMUSCULAR | Status: DC | PRN
  Administered 2011-04-28: 5000 [IU] via INTRAVENOUS

## 2011-04-28 MED ORDER — MUPIROCIN 2 % EX OINT
TOPICAL_OINTMENT | Freq: Two times a day (BID) | CUTANEOUS | Status: DC
  Administered 2011-04-28: 1 via NASAL

## 2011-04-28 MED ORDER — LIDOCAINE HCL (PF) 1 % IJ SOLN
INTRAMUSCULAR | Status: DC | PRN
  Administered 2011-04-28: 9.5 mL

## 2011-04-28 MED ORDER — HYDROMORPHONE HCL PF 1 MG/ML IJ SOLN: 0.2500 mg | INTRAMUSCULAR | Status: DC | PRN

## 2011-04-28 MED ORDER — MORPHINE SULFATE 2 MG/ML IJ SOLN: 0.0500 mg/kg | INTRAMUSCULAR | Status: DC | PRN

## 2011-04-28 MED ORDER — FENTANYL CITRATE 0.05 MG/ML IJ SOLN
INTRAMUSCULAR | Status: DC | PRN
  Administered 2011-04-28 (×2): 25 ug via INTRAVENOUS

## 2011-04-28 MED ORDER — OXYCODONE HCL 5 MG PO TABS: 5.0000 mg | ORAL_TABLET | ORAL | Status: AC | PRN

## 2011-04-28 MED ORDER — SODIUM CHLORIDE 0.9 % IR SOLN
Status: DC | PRN
  Administered 2011-04-28: 09:00:00

## 2011-04-28 MED ORDER — ONDANSETRON HCL 4 MG/2ML IJ SOLN: 4.0000 mg | Freq: Once | INTRAMUSCULAR | Status: DC | PRN

## 2011-04-28 MED ORDER — MIDAZOLAM HCL 5 MG/5ML IJ SOLN
INTRAMUSCULAR | Status: DC | PRN
  Administered 2011-04-28 (×2): 1 mg via INTRAVENOUS

## 2011-04-28 MED ORDER — SODIUM CHLORIDE 0.9 % IV SOLN
INTRAVENOUS | Status: DC | PRN
  Administered 2011-04-28: 08:00:00 via INTRAVENOUS

## 2011-04-28 MED ORDER — ONDANSETRON HCL 4 MG/2ML IJ SOLN
INTRAMUSCULAR | Status: DC | PRN
  Administered 2011-04-28: 4 mg via INTRAVENOUS

## 2011-04-28 MED ORDER — 0.9 % SODIUM CHLORIDE (POUR BTL) OPTIME
TOPICAL | Status: DC | PRN
  Administered 2011-04-28: 1000 mL

## 2011-04-28 MED ORDER — PROPOFOL 10 MG/ML IV EMUL
INTRAVENOUS | Status: DC | PRN
  Administered 2011-04-28: 75 ug/kg/min via INTRAVENOUS

## 2011-04-28 MED ORDER — MUPIROCIN 2 % EX OINT
TOPICAL_OINTMENT | CUTANEOUS | Status: AC
  Administered 2011-04-28: 1 via NASAL
  Filled 2011-04-28: qty 22

## 2011-04-28 MED ORDER — OXYCODONE HCL 5 MG PO TABS
5.0000 mg | ORAL_TABLET | Freq: Once | ORAL | Status: AC
  Administered 2011-04-28: 5 mg via ORAL

## 2011-04-28 SURGICAL SUPPLY — 43 items
ADH SKN CLS APL DERMABOND .7 (GAUZE/BANDAGES/DRESSINGS) ×2
ADH SKN CLS LQ APL DERMABOND (GAUZE/BANDAGES/DRESSINGS) ×2
CANISTER SUCTION 2500CC (MISCELLANEOUS) ×3 IMPLANT
CLIP TI MEDIUM 6 (CLIP) ×3 IMPLANT
CLIP TI WIDE RED SMALL 6 (CLIP) ×3 IMPLANT
CLOTH BEACON ORANGE TIMEOUT ST (SAFETY) ×3 IMPLANT
COVER PROBE W GEL 5X96 (DRAPES) ×2 IMPLANT
COVER SURGICAL LIGHT HANDLE (MISCELLANEOUS) ×10 IMPLANT
DECANTER SPIKE VIAL GLASS SM (MISCELLANEOUS) ×3 IMPLANT
DERMABOND ADHESIVE PROPEN (GAUZE/BANDAGES/DRESSINGS) ×1
DERMABOND ADVANCED (GAUZE/BANDAGES/DRESSINGS) ×1
DERMABOND ADVANCED .7 DNX12 (GAUZE/BANDAGES/DRESSINGS) ×2 IMPLANT
DERMABOND ADVANCED .7 DNX6 (GAUZE/BANDAGES/DRESSINGS) ×1 IMPLANT
ELECT REM PT RETURN 9FT ADLT (ELECTROSURGICAL) ×3
ELECTRODE REM PT RTRN 9FT ADLT (ELECTROSURGICAL) ×2 IMPLANT
GAUZE SPONGE 2X2 8PLY STRL LF (GAUZE/BANDAGES/DRESSINGS) ×2 IMPLANT
GEL ULTRASOUND 20GR AQUASONIC (MISCELLANEOUS) IMPLANT
GLOVE BIO SURGEON STRL SZ 6.5 (GLOVE) ×4 IMPLANT
GLOVE BIO SURGEON STRL SZ7.5 (GLOVE) ×3 IMPLANT
GLOVE BIOGEL PI IND STRL 7.0 (GLOVE) ×1 IMPLANT
GLOVE BIOGEL PI IND STRL 7.5 (GLOVE) ×1 IMPLANT
GLOVE BIOGEL PI INDICATOR 7.0 (GLOVE) ×1
GLOVE BIOGEL PI INDICATOR 7.5 (GLOVE) ×1
GLOVE SURG SS PI 7.5 STRL IVOR (GLOVE) ×2 IMPLANT
GOWN PREVENTION PLUS XLARGE (GOWN DISPOSABLE) ×5 IMPLANT
GOWN STRL NON-REIN LRG LVL3 (GOWN DISPOSABLE) ×8 IMPLANT
KIT BASIN OR (CUSTOM PROCEDURE TRAY) ×3 IMPLANT
KIT ROOM TURNOVER OR (KITS) ×3 IMPLANT
LOOP VESSEL MINI RED (MISCELLANEOUS) ×2 IMPLANT
NS IRRIG 1000ML POUR BTL (IV SOLUTION) ×3 IMPLANT
PACK CV ACCESS (CUSTOM PROCEDURE TRAY) ×3 IMPLANT
PAD ARMBOARD 7.5X6 YLW CONV (MISCELLANEOUS) ×6 IMPLANT
PATCH VASCULAR VASCU GUARD 1X6 (Vascular Products) ×2 IMPLANT
SPONGE GAUZE 2X2 STER 10/PKG (GAUZE/BANDAGES/DRESSINGS) ×1
SPONGE SURGIFOAM ABS GEL 100 (HEMOSTASIS) IMPLANT
SUT PROLENE 6 0 CC (SUTURE) ×5 IMPLANT
SUT VIC AB 3-0 SH 27 (SUTURE) ×6
SUT VIC AB 3-0 SH 27X BRD (SUTURE) ×3 IMPLANT
SUT VICRYL 4-0 PS2 18IN ABS (SUTURE) ×5 IMPLANT
TOWEL OR 17X24 6PK STRL BLUE (TOWEL DISPOSABLE) ×3 IMPLANT
TOWEL OR 17X26 10 PK STRL BLUE (TOWEL DISPOSABLE) ×3 IMPLANT
UNDERPAD 30X30 INCONTINENT (UNDERPADS AND DIAPERS) ×3 IMPLANT
WATER STERILE IRR 1000ML POUR (IV SOLUTION) ×3 IMPLANT

## 2011-04-28 NOTE — Progress Notes (Signed)
On arrival to Post Op,pts sats were 82% on RA;o2 placed via N/C @ 2L/m-remained at 82%,increased O2 to 5l/m and sats instantly at 100%.  decreased o2 to 4l/m and sats remain 100%  decreased o2 to 3l/m and sats remain 99%.  decreased O2 to 2L/min and sats remain 98-99%.  decreased O2 to 1L/min and sats remain 97-99%.  discontinued O2.  Pt on room air and sats remain 83%  O2 placed back at 1.5l/m and now sats 97%

## 2011-04-28 NOTE — Anesthesia Postprocedure Evaluation (Signed)
  Anesthesia Post-op Note  Patient: [name]  Procedure(s) Performed:  REVISON OF ARTERIOVENOUS FISTULA - with Patch Angioplasty using a 1x6 Bovine Pericardium patch, and ligation of competing branches; LIGATION OF ARTERIOVENOUS  FISTULA - Ligation of Competing Branches  Patient Location: PACU  Anesthesia Type: MAC  Level of Consciousness: awake and alert   Airway and Oxygen Therapy: Patient Spontanous Breathing and Patient connected to nasal cannula oxygen  Post-op Pain: none  Post-op Assessment: Post-op Vital signs reviewed, Patient's Cardiovascular Status Stable, Respiratory Function Stable, Patent Airway, No signs of Nausea or vomiting and Pain level controlled  Post-op Vital Signs: Reviewed and stable  Complications: No apparent anesthesia complications

## 2011-04-28 NOTE — Interval H&P Note (Signed)
History and Physical Interval Note:  04/28/2011 8:36 AM  [name]  has presented today for surgery, with the diagnosis of ESRD.COMPLICATION OF AVF  The various methods of treatment have been discussed with the patient and family. After consideration of risks, benefits and other options for treatment, the patient has consented to  Procedure(s): REVISION OF ARTERIOVENOUS GORETEX GRAFT as a surgical intervention .  The patients' history has been reviewed, patient examined, no change in status, stable for surgery.  I have reviewed the patients' chart and labs.  Questions were answered to the patient's satisfaction.     FIELDS,CHARLES E

## 2011-04-28 NOTE — Transfer of Care (Signed)
Immediate Anesthesia Transfer of Care Note  Patient: [name]  Procedure(s) Performed:  REVISON OF ARTERIOVENOUS FISTULA - with Patch Angioplasty using a 1x6 Bovine Pericardium patch, and ligation of competing branches; LIGATION OF ARTERIOVENOUS  FISTULA - Ligation of Competing Branches  Patient Location: PACU  Anesthesia Type: MAC  Level of Consciousness: awake and confused  Airway & Oxygen Therapy: Patient Spontanous Breathing and Patient connected to face mask oxygen  Post-op Assessment: Report given to PACU RN and Post -op Vital signs reviewed and stable  Post vital signs: stable  Complications: No apparent anesthesia complications

## 2011-04-28 NOTE — Preoperative (Signed)
Beta Blockers   Reason not to administer Beta Blockers:Not Applicable 

## 2011-04-28 NOTE — Anesthesia Preprocedure Evaluation (Addendum)
Anesthesia Evaluation  Patient identified by MRN, date of birth, ID band Patient awake    Reviewed: Allergy & Precautions, H&P , NPO status , Patient's Chart, lab work & pertinent test results  History of Anesthesia Complications Negative for: history of anesthetic complications  Airway Mallampati: III TM Distance: >3 FB Neck ROM: Full    Dental  (+) Dental Advisory Given and Poor Dentition   Pulmonary former smoker clear to auscultation        Cardiovascular hypertension, Pt. on medications Regular Normal    Neuro/Psych Anxiety    GI/Hepatic   Endo/Other  Diabetes mellitus-, Well Controlled, Type 2Am cbg = 91  Renal/GU ESRF and DialysisRenal diseaseLast HD Tues 1/30     Musculoskeletal   Abdominal   Peds  Hematology   Anesthesia Other Findings   Reproductive/Obstetrics                          Anesthesia Physical Anesthesia Plan  ASA: IV  Anesthesia Plan: MAC   Post-op Pain Management:    Induction: Intravenous  Airway Management Planned: Mask and Oral ETT  Additional Equipment:   Intra-op Plan:   Post-operative Plan: Extubation in OR  Informed Consent:   Dental advisory given  Plan Discussed with: CRNA, Anesthesiologist and Surgeon  Anesthesia Plan Comments:         Anesthesia Quick Evaluation

## 2011-04-28 NOTE — H&P (View-Only) (Signed)
[facility] [place] HISTORY AND PHYSICAL    History of Present Illness:  Patient is a 79 y.o. year old female who presents for evaluation of her right brachiocephalic AV fistula. She has had difficulty cannulating this. It sometimes takes up to 45 minutes to get her on dialysis. The fistula was placed in April 2012. She denies any symptoms of steal. Her current dialysis today is Tuesday Thursday and Saturday.   Physical Examination  Filed Vitals:   04/15/11 1138  BP: 150/58  Pulse: 66  Temp: 97.7 F (36.5 C)  Resp: 12    Body mass index is 23.68 kg/(m^2).  General:  Alert and oriented, no acute distress Neck: No bruit or JVD Skin: No rash Extremities:  Right upper extremity fistula with palpable thrill in several areas of ecchymosis, 1-2+ right radial pulse Neurologic: Upper and lower extremity motor 5/5 and symmetric  DATA: Duplex ultrasound of the right AV fistula was performed today. This shows a duplicated system at the proximal aspect of the fistula. The diameter of the fistula is between 5 and 9 mm. There is no obvious area of narrowing. There were some prominent valves in the distal portion the fistula.   ASSESSMENT: Poorly developed right upper arm AV fistula.   PLAN: We will schedule her for right arm fistulogram tomorrow. Possible angioplasty. If we do not see anything that is fixable on the fistulogram we will schedule her for elective conversion of this to an AV graft. Risks benefits possible palpitations and procedure details were trying to the patient today and she agrees to proceed.   Charles Fields, MD [facility] [place] Office: [phone] Pager: [phone] 

## 2011-04-28 NOTE — Op Note (Signed)
Procedure: Revision Right Brachial Cephalic AV Fistula with side branch ligation PreOp: Poorly functioning AVF PostOp: same Anesthesia: Local with MAC Asst: Newton Pigg, PA-c  Findings: 70% narrowing proximal fistula probably hypertrophied valve repaired with bovine pericardial patch   1 large side branches ligated  Operative details: After obtaining informed consent, the patient was taken to the operating room. The patient was placed in supine position on the operating table. After induction of general anesthesia, the patient's entire right upper extremity was prepped and draped in the correct in enamel usual sterile fashion. A longitudinal incision was made through a pre-existing scar near the right antecubital area. The incision was carried onto the subcutaneous tissues down to level of the pre-existing AV fistula. The fistula was patent and did have a thrill within it. Preoperative ultrasound had suggested there was a narrowing and hypertrophied valve at proximal aspect of the fistula. This had been confirmed with a fistulogram.  The flow through the fistula was slightly diminished in this area. Next the fistula was dissected free all the way down to level of the proximal anastomosis. The patient was given 5000 units of intravenous heparin. The fistula was clamped at the level of the anastomosis as well as just distal to the stenosed segment. A longitudinal opening was made in the fistula with a 11 blade. There was a thickened hypertrophied valve with intimal hypoplasia over approximately 3 cm. A bovine pericardial patch was brought up on the operative field and sewn on as a patch angioplasty using a running 6-0 Prolene suture. Just prior to completion of the anastomosis, it was forebled backbled and thoroughly flushed.  The anastomosis was secured; clamps released; and  a palpable thrill was palpable in the fistula immediately.  Preoperative ultrasound also showed 1 large side branches distal  to this. This was identified on the table by live ultrasound.  Local anesthesia was infiltrated at the midportion of the fistula and a longitudinal incision made in this location and carried down through the subcutaneous tissues to the area of the side branch.  The branch was 5-6 mm in diameter.  This was ligate with a 2 0  silk tie.   Next subcutaneous tissues of each incision were reapproximated using running 3-0 Vicryl suture. The skin was closed with 4 Vicryl subcuticular stitch. The patient tolerated the procedure well and there were no complications. Instrument sponge and needle counts were correct at the end of the case. The patient was taken to the recovery room in stable condition.  Fabienne Bruns, MD [facility] [place] Office: [phone] Pager: [phone]

## 2011-04-28 NOTE — Progress Notes (Addendum)
Dr.Fields notified of sat levels and amount of O2 the pt is on   Continue to wean and watch pt  At 1325-o2 decreased to 3l/m via N/C with sats @ 100%  At 1328 o2 decreased to 2l/m via n/c with sats @ 100%  At 1333 O2 decreased to RA sats 98%-on right finger

## 2011-04-29 DIAGNOSIS — N186 End stage renal disease: Secondary | ICD-10-CM | POA: Diagnosis not present

## 2011-04-29 DIAGNOSIS — E119 Type 2 diabetes mellitus without complications: Secondary | ICD-10-CM | POA: Diagnosis not present

## 2011-04-29 DIAGNOSIS — N039 Chronic nephritic syndrome with unspecified morphologic changes: Secondary | ICD-10-CM | POA: Diagnosis not present

## 2011-04-29 DIAGNOSIS — Z23 Encounter for immunization: Secondary | ICD-10-CM | POA: Diagnosis not present

## 2011-04-29 DIAGNOSIS — Z992 Dependence on renal dialysis: Secondary | ICD-10-CM | POA: Diagnosis not present

## 2011-04-29 LAB — POCT I-STAT, CHEM 8
Creatinine, Ser: 3.1 mg/dL — ABNORMAL HIGH (ref 0.50–1.10)
Glucose, Bld: 119 mg/dL — ABNORMAL HIGH (ref 70–99)
HCT: 41 % (ref 36.0–46.0)
Hemoglobin: 13.9 g/dL (ref 12.0–15.0)
Potassium: 3.2 mEq/L — ABNORMAL LOW (ref 3.5–5.1)
Sodium: 143 mEq/L (ref 135–145)
TCO2: 32 mmol/L (ref 0–100)

## 2011-05-01 DIAGNOSIS — E119 Type 2 diabetes mellitus without complications: Secondary | ICD-10-CM | POA: Diagnosis not present

## 2011-05-01 DIAGNOSIS — N039 Chronic nephritic syndrome with unspecified morphologic changes: Secondary | ICD-10-CM | POA: Diagnosis not present

## 2011-05-01 DIAGNOSIS — N186 End stage renal disease: Secondary | ICD-10-CM | POA: Diagnosis not present

## 2011-05-01 DIAGNOSIS — D631 Anemia in chronic kidney disease: Secondary | ICD-10-CM | POA: Diagnosis not present

## 2011-05-01 DIAGNOSIS — D509 Iron deficiency anemia, unspecified: Secondary | ICD-10-CM | POA: Diagnosis not present

## 2011-05-01 DIAGNOSIS — Z23 Encounter for immunization: Secondary | ICD-10-CM | POA: Diagnosis not present

## 2011-05-05 DIAGNOSIS — N039 Chronic nephritic syndrome with unspecified morphologic changes: Secondary | ICD-10-CM | POA: Diagnosis not present

## 2011-05-05 DIAGNOSIS — D631 Anemia in chronic kidney disease: Secondary | ICD-10-CM | POA: Diagnosis not present

## 2011-05-05 DIAGNOSIS — E119 Type 2 diabetes mellitus without complications: Secondary | ICD-10-CM | POA: Diagnosis not present

## 2011-05-05 DIAGNOSIS — N186 End stage renal disease: Secondary | ICD-10-CM | POA: Diagnosis not present

## 2011-05-05 DIAGNOSIS — D509 Iron deficiency anemia, unspecified: Secondary | ICD-10-CM | POA: Diagnosis not present

## 2011-05-05 DIAGNOSIS — Z23 Encounter for immunization: Secondary | ICD-10-CM | POA: Diagnosis not present

## 2011-05-06 DIAGNOSIS — Z23 Encounter for immunization: Secondary | ICD-10-CM | POA: Diagnosis not present

## 2011-05-06 DIAGNOSIS — N186 End stage renal disease: Secondary | ICD-10-CM | POA: Diagnosis not present

## 2011-05-06 DIAGNOSIS — N039 Chronic nephritic syndrome with unspecified morphologic changes: Secondary | ICD-10-CM | POA: Diagnosis not present

## 2011-05-06 DIAGNOSIS — D509 Iron deficiency anemia, unspecified: Secondary | ICD-10-CM | POA: Diagnosis not present

## 2011-05-06 DIAGNOSIS — E119 Type 2 diabetes mellitus without complications: Secondary | ICD-10-CM | POA: Diagnosis not present

## 2011-05-08 DIAGNOSIS — Z23 Encounter for immunization: Secondary | ICD-10-CM | POA: Diagnosis not present

## 2011-05-08 DIAGNOSIS — N186 End stage renal disease: Secondary | ICD-10-CM | POA: Diagnosis not present

## 2011-05-08 DIAGNOSIS — N039 Chronic nephritic syndrome with unspecified morphologic changes: Secondary | ICD-10-CM | POA: Diagnosis not present

## 2011-05-08 DIAGNOSIS — D509 Iron deficiency anemia, unspecified: Secondary | ICD-10-CM | POA: Diagnosis not present

## 2011-05-08 DIAGNOSIS — E119 Type 2 diabetes mellitus without complications: Secondary | ICD-10-CM | POA: Diagnosis not present

## 2011-05-11 DIAGNOSIS — E119 Type 2 diabetes mellitus without complications: Secondary | ICD-10-CM | POA: Diagnosis not present

## 2011-05-11 DIAGNOSIS — D509 Iron deficiency anemia, unspecified: Secondary | ICD-10-CM | POA: Diagnosis not present

## 2011-05-11 DIAGNOSIS — N039 Chronic nephritic syndrome with unspecified morphologic changes: Secondary | ICD-10-CM | POA: Diagnosis not present

## 2011-05-11 DIAGNOSIS — N186 End stage renal disease: Secondary | ICD-10-CM | POA: Diagnosis not present

## 2011-05-11 DIAGNOSIS — Z23 Encounter for immunization: Secondary | ICD-10-CM | POA: Diagnosis not present

## 2011-05-12 DIAGNOSIS — Z23 Encounter for immunization: Secondary | ICD-10-CM | POA: Diagnosis not present

## 2011-05-12 DIAGNOSIS — E119 Type 2 diabetes mellitus without complications: Secondary | ICD-10-CM | POA: Diagnosis not present

## 2011-05-12 DIAGNOSIS — N039 Chronic nephritic syndrome with unspecified morphologic changes: Secondary | ICD-10-CM | POA: Diagnosis not present

## 2011-05-12 DIAGNOSIS — D509 Iron deficiency anemia, unspecified: Secondary | ICD-10-CM | POA: Diagnosis not present

## 2011-05-12 DIAGNOSIS — N186 End stage renal disease: Secondary | ICD-10-CM | POA: Diagnosis not present

## 2011-05-15 DIAGNOSIS — N039 Chronic nephritic syndrome with unspecified morphologic changes: Secondary | ICD-10-CM | POA: Diagnosis not present

## 2011-05-15 DIAGNOSIS — D509 Iron deficiency anemia, unspecified: Secondary | ICD-10-CM | POA: Diagnosis not present

## 2011-05-15 DIAGNOSIS — E119 Type 2 diabetes mellitus without complications: Secondary | ICD-10-CM | POA: Diagnosis not present

## 2011-05-15 DIAGNOSIS — N186 End stage renal disease: Secondary | ICD-10-CM | POA: Diagnosis not present

## 2011-05-15 DIAGNOSIS — D631 Anemia in chronic kidney disease: Secondary | ICD-10-CM | POA: Diagnosis not present

## 2011-05-15 DIAGNOSIS — Z23 Encounter for immunization: Secondary | ICD-10-CM | POA: Diagnosis not present

## 2011-05-18 DIAGNOSIS — E119 Type 2 diabetes mellitus without complications: Secondary | ICD-10-CM | POA: Diagnosis not present

## 2011-05-18 DIAGNOSIS — N039 Chronic nephritic syndrome with unspecified morphologic changes: Secondary | ICD-10-CM | POA: Diagnosis not present

## 2011-05-18 DIAGNOSIS — N186 End stage renal disease: Secondary | ICD-10-CM | POA: Diagnosis not present

## 2011-05-18 DIAGNOSIS — Z23 Encounter for immunization: Secondary | ICD-10-CM | POA: Diagnosis not present

## 2011-05-18 DIAGNOSIS — D509 Iron deficiency anemia, unspecified: Secondary | ICD-10-CM | POA: Diagnosis not present

## 2011-05-19 ENCOUNTER — Encounter: Payer: Self-pay | Admitting: Vascular Surgery

## 2011-05-20 ENCOUNTER — Encounter: Payer: Self-pay | Admitting: Vascular Surgery

## 2011-05-20 ENCOUNTER — Ambulatory Visit (INDEPENDENT_AMBULATORY_CARE_PROVIDER_SITE_OTHER): Payer: Medicare Other | Admitting: Vascular Surgery

## 2011-05-20 VITALS — BP 169/54 | HR 64 | Resp 16 | Ht 59.0 in | Wt 115.0 lb

## 2011-05-20 DIAGNOSIS — N186 End stage renal disease: Secondary | ICD-10-CM | POA: Diagnosis not present

## 2011-05-20 DIAGNOSIS — Z48812 Encounter for surgical aftercare following surgery on the circulatory system: Secondary | ICD-10-CM

## 2011-05-20 DIAGNOSIS — E119 Type 2 diabetes mellitus without complications: Secondary | ICD-10-CM | POA: Diagnosis not present

## 2011-05-20 DIAGNOSIS — Z23 Encounter for immunization: Secondary | ICD-10-CM | POA: Diagnosis not present

## 2011-05-20 DIAGNOSIS — D631 Anemia in chronic kidney disease: Secondary | ICD-10-CM | POA: Diagnosis not present

## 2011-05-20 DIAGNOSIS — T82898A Other specified complication of vascular prosthetic devices, implants and grafts, initial encounter: Secondary | ICD-10-CM

## 2011-05-20 DIAGNOSIS — D509 Iron deficiency anemia, unspecified: Secondary | ICD-10-CM | POA: Diagnosis not present

## 2011-05-20 NOTE — Progress Notes (Signed)
[facility] [place] HISTORY AND PHYSICAL    History of Present Illness:  Patient is a 78 y.o. year old female who presents for post-operative follow-up after revision of her right brachiocephalic AV fistula with a bovine pericardial patch proximally. She also had ligation of one large side branch. There successfully cannulating her fistula currently on dialysis. She does have some swelling in the midportion of the fistula.  She also complains of some mild decreased muscle strength in her right upper extremity. She denies any numbness or tingling in her hand.   Physical Examination  Filed Vitals:   05/20/11 1533  BP: 169/54  Pulse: 64  Resp: 16    Body mass index is 23.23 kg/(m^2).  General:  Alert and oriented, no acute distress Neck: No bruit or JVD Skin: No rash Extremities:  Healed incisions right antecubital and mid upper arm with small hematoma mid upper arm, there is also some skin ecchymosis, absent right radial pulse, audible bruit and palpable thrill right arm AV fistula Neurologic: Upper and lower extremity motor 5/5 and symmetric    ASSESSMENT: Improved fistula after revision. The fistula is much more prominent at this point. Hopefully as the swelling and hematoma resolve the fistula will continue to improve. They're currently cannulating it successfully on dialysis and considering buttonhole technique.   PLAN:  She will followup on as-needed basis.   Fabienne Bruns, MD [facility] [place] Office: [phone] Pager: [phone]

## 2011-05-22 DIAGNOSIS — Z23 Encounter for immunization: Secondary | ICD-10-CM | POA: Diagnosis not present

## 2011-05-22 DIAGNOSIS — D509 Iron deficiency anemia, unspecified: Secondary | ICD-10-CM | POA: Diagnosis not present

## 2011-05-22 DIAGNOSIS — E119 Type 2 diabetes mellitus without complications: Secondary | ICD-10-CM | POA: Diagnosis not present

## 2011-05-22 DIAGNOSIS — N186 End stage renal disease: Secondary | ICD-10-CM | POA: Diagnosis not present

## 2011-05-22 DIAGNOSIS — D631 Anemia in chronic kidney disease: Secondary | ICD-10-CM | POA: Diagnosis not present

## 2011-05-25 DIAGNOSIS — Z23 Encounter for immunization: Secondary | ICD-10-CM | POA: Diagnosis not present

## 2011-05-25 DIAGNOSIS — D631 Anemia in chronic kidney disease: Secondary | ICD-10-CM | POA: Diagnosis not present

## 2011-05-25 DIAGNOSIS — D509 Iron deficiency anemia, unspecified: Secondary | ICD-10-CM | POA: Diagnosis not present

## 2011-05-25 DIAGNOSIS — E119 Type 2 diabetes mellitus without complications: Secondary | ICD-10-CM | POA: Diagnosis not present

## 2011-05-25 DIAGNOSIS — N186 End stage renal disease: Secondary | ICD-10-CM | POA: Diagnosis not present

## 2011-05-27 DIAGNOSIS — D509 Iron deficiency anemia, unspecified: Secondary | ICD-10-CM | POA: Diagnosis not present

## 2011-05-27 DIAGNOSIS — E119 Type 2 diabetes mellitus without complications: Secondary | ICD-10-CM | POA: Diagnosis not present

## 2011-05-27 DIAGNOSIS — N186 End stage renal disease: Secondary | ICD-10-CM | POA: Diagnosis not present

## 2011-05-27 DIAGNOSIS — N039 Chronic nephritic syndrome with unspecified morphologic changes: Secondary | ICD-10-CM | POA: Diagnosis not present

## 2011-05-27 DIAGNOSIS — Z23 Encounter for immunization: Secondary | ICD-10-CM | POA: Diagnosis not present

## 2011-05-29 DIAGNOSIS — E878 Other disorders of electrolyte and fluid balance, not elsewhere classified: Secondary | ICD-10-CM | POA: Diagnosis not present

## 2011-05-29 DIAGNOSIS — E785 Hyperlipidemia, unspecified: Secondary | ICD-10-CM | POA: Diagnosis not present

## 2011-05-29 DIAGNOSIS — D509 Iron deficiency anemia, unspecified: Secondary | ICD-10-CM | POA: Diagnosis not present

## 2011-05-29 DIAGNOSIS — N2581 Secondary hyperparathyroidism of renal origin: Secondary | ICD-10-CM | POA: Diagnosis not present

## 2011-05-29 DIAGNOSIS — D631 Anemia in chronic kidney disease: Secondary | ICD-10-CM | POA: Diagnosis not present

## 2011-05-29 DIAGNOSIS — E119 Type 2 diabetes mellitus without complications: Secondary | ICD-10-CM | POA: Diagnosis not present

## 2011-05-29 DIAGNOSIS — Z992 Dependence on renal dialysis: Secondary | ICD-10-CM | POA: Diagnosis not present

## 2011-05-29 DIAGNOSIS — N186 End stage renal disease: Secondary | ICD-10-CM | POA: Diagnosis not present

## 2011-05-31 DIAGNOSIS — H16209 Unspecified keratoconjunctivitis, unspecified eye: Secondary | ICD-10-CM | POA: Diagnosis not present

## 2011-06-01 DIAGNOSIS — E785 Hyperlipidemia, unspecified: Secondary | ICD-10-CM | POA: Diagnosis not present

## 2011-06-01 DIAGNOSIS — N186 End stage renal disease: Secondary | ICD-10-CM | POA: Diagnosis not present

## 2011-06-01 DIAGNOSIS — D631 Anemia in chronic kidney disease: Secondary | ICD-10-CM | POA: Diagnosis not present

## 2011-06-01 DIAGNOSIS — D509 Iron deficiency anemia, unspecified: Secondary | ICD-10-CM | POA: Diagnosis not present

## 2011-06-01 DIAGNOSIS — E119 Type 2 diabetes mellitus without complications: Secondary | ICD-10-CM | POA: Diagnosis not present

## 2011-06-02 DIAGNOSIS — Z1231 Encounter for screening mammogram for malignant neoplasm of breast: Secondary | ICD-10-CM | POA: Diagnosis not present

## 2011-06-03 DIAGNOSIS — D509 Iron deficiency anemia, unspecified: Secondary | ICD-10-CM | POA: Diagnosis not present

## 2011-06-03 DIAGNOSIS — Z992 Dependence on renal dialysis: Secondary | ICD-10-CM | POA: Diagnosis not present

## 2011-06-03 DIAGNOSIS — N186 End stage renal disease: Secondary | ICD-10-CM | POA: Diagnosis not present

## 2011-06-03 DIAGNOSIS — E785 Hyperlipidemia, unspecified: Secondary | ICD-10-CM | POA: Diagnosis not present

## 2011-06-03 DIAGNOSIS — D631 Anemia in chronic kidney disease: Secondary | ICD-10-CM | POA: Diagnosis not present

## 2011-06-03 DIAGNOSIS — E119 Type 2 diabetes mellitus without complications: Secondary | ICD-10-CM | POA: Diagnosis not present

## 2011-06-05 DIAGNOSIS — E785 Hyperlipidemia, unspecified: Secondary | ICD-10-CM | POA: Diagnosis not present

## 2011-06-05 DIAGNOSIS — D509 Iron deficiency anemia, unspecified: Secondary | ICD-10-CM | POA: Diagnosis not present

## 2011-06-05 DIAGNOSIS — E119 Type 2 diabetes mellitus without complications: Secondary | ICD-10-CM | POA: Diagnosis not present

## 2011-06-05 DIAGNOSIS — D631 Anemia in chronic kidney disease: Secondary | ICD-10-CM | POA: Diagnosis not present

## 2011-06-05 DIAGNOSIS — N186 End stage renal disease: Secondary | ICD-10-CM | POA: Diagnosis not present

## 2011-06-08 DIAGNOSIS — D509 Iron deficiency anemia, unspecified: Secondary | ICD-10-CM | POA: Diagnosis not present

## 2011-06-08 DIAGNOSIS — N186 End stage renal disease: Secondary | ICD-10-CM | POA: Diagnosis not present

## 2011-06-08 DIAGNOSIS — D631 Anemia in chronic kidney disease: Secondary | ICD-10-CM | POA: Diagnosis not present

## 2011-06-08 DIAGNOSIS — E785 Hyperlipidemia, unspecified: Secondary | ICD-10-CM | POA: Diagnosis not present

## 2011-06-08 DIAGNOSIS — E119 Type 2 diabetes mellitus without complications: Secondary | ICD-10-CM | POA: Diagnosis not present

## 2011-06-10 DIAGNOSIS — E119 Type 2 diabetes mellitus without complications: Secondary | ICD-10-CM | POA: Diagnosis not present

## 2011-06-10 DIAGNOSIS — D631 Anemia in chronic kidney disease: Secondary | ICD-10-CM | POA: Diagnosis not present

## 2011-06-10 DIAGNOSIS — N186 End stage renal disease: Secondary | ICD-10-CM | POA: Diagnosis not present

## 2011-06-10 DIAGNOSIS — D509 Iron deficiency anemia, unspecified: Secondary | ICD-10-CM | POA: Diagnosis not present

## 2011-06-10 DIAGNOSIS — E785 Hyperlipidemia, unspecified: Secondary | ICD-10-CM | POA: Diagnosis not present

## 2011-06-12 DIAGNOSIS — N186 End stage renal disease: Secondary | ICD-10-CM | POA: Diagnosis not present

## 2011-06-12 DIAGNOSIS — D509 Iron deficiency anemia, unspecified: Secondary | ICD-10-CM | POA: Diagnosis not present

## 2011-06-12 DIAGNOSIS — E785 Hyperlipidemia, unspecified: Secondary | ICD-10-CM | POA: Diagnosis not present

## 2011-06-12 DIAGNOSIS — E119 Type 2 diabetes mellitus without complications: Secondary | ICD-10-CM | POA: Diagnosis not present

## 2011-06-12 DIAGNOSIS — N039 Chronic nephritic syndrome with unspecified morphologic changes: Secondary | ICD-10-CM | POA: Diagnosis not present

## 2011-06-15 DIAGNOSIS — N186 End stage renal disease: Secondary | ICD-10-CM | POA: Diagnosis not present

## 2011-06-15 DIAGNOSIS — N039 Chronic nephritic syndrome with unspecified morphologic changes: Secondary | ICD-10-CM | POA: Diagnosis not present

## 2011-06-15 DIAGNOSIS — E785 Hyperlipidemia, unspecified: Secondary | ICD-10-CM | POA: Diagnosis not present

## 2011-06-15 DIAGNOSIS — E119 Type 2 diabetes mellitus without complications: Secondary | ICD-10-CM | POA: Diagnosis not present

## 2011-06-15 DIAGNOSIS — D509 Iron deficiency anemia, unspecified: Secondary | ICD-10-CM | POA: Diagnosis not present

## 2011-06-16 DIAGNOSIS — D631 Anemia in chronic kidney disease: Secondary | ICD-10-CM | POA: Diagnosis not present

## 2011-06-16 DIAGNOSIS — N186 End stage renal disease: Secondary | ICD-10-CM | POA: Diagnosis not present

## 2011-06-16 DIAGNOSIS — E785 Hyperlipidemia, unspecified: Secondary | ICD-10-CM | POA: Diagnosis not present

## 2011-06-16 DIAGNOSIS — D509 Iron deficiency anemia, unspecified: Secondary | ICD-10-CM | POA: Diagnosis not present

## 2011-06-16 DIAGNOSIS — E119 Type 2 diabetes mellitus without complications: Secondary | ICD-10-CM | POA: Diagnosis not present

## 2011-06-17 DIAGNOSIS — D509 Iron deficiency anemia, unspecified: Secondary | ICD-10-CM | POA: Diagnosis not present

## 2011-06-17 DIAGNOSIS — N186 End stage renal disease: Secondary | ICD-10-CM | POA: Diagnosis not present

## 2011-06-17 DIAGNOSIS — E119 Type 2 diabetes mellitus without complications: Secondary | ICD-10-CM | POA: Diagnosis not present

## 2011-06-17 DIAGNOSIS — Z992 Dependence on renal dialysis: Secondary | ICD-10-CM | POA: Diagnosis not present

## 2011-06-17 DIAGNOSIS — E785 Hyperlipidemia, unspecified: Secondary | ICD-10-CM | POA: Diagnosis not present

## 2011-06-17 DIAGNOSIS — E1129 Type 2 diabetes mellitus with other diabetic kidney complication: Secondary | ICD-10-CM | POA: Diagnosis not present

## 2011-06-17 DIAGNOSIS — D631 Anemia in chronic kidney disease: Secondary | ICD-10-CM | POA: Diagnosis not present

## 2011-06-19 DIAGNOSIS — E119 Type 2 diabetes mellitus without complications: Secondary | ICD-10-CM | POA: Diagnosis not present

## 2011-06-19 DIAGNOSIS — D509 Iron deficiency anemia, unspecified: Secondary | ICD-10-CM | POA: Diagnosis not present

## 2011-06-19 DIAGNOSIS — N186 End stage renal disease: Secondary | ICD-10-CM | POA: Diagnosis not present

## 2011-06-19 DIAGNOSIS — E785 Hyperlipidemia, unspecified: Secondary | ICD-10-CM | POA: Diagnosis not present

## 2011-06-19 DIAGNOSIS — N039 Chronic nephritic syndrome with unspecified morphologic changes: Secondary | ICD-10-CM | POA: Diagnosis not present

## 2011-06-22 DIAGNOSIS — D509 Iron deficiency anemia, unspecified: Secondary | ICD-10-CM | POA: Diagnosis not present

## 2011-06-22 DIAGNOSIS — N039 Chronic nephritic syndrome with unspecified morphologic changes: Secondary | ICD-10-CM | POA: Diagnosis not present

## 2011-06-22 DIAGNOSIS — N186 End stage renal disease: Secondary | ICD-10-CM | POA: Diagnosis not present

## 2011-06-22 DIAGNOSIS — E119 Type 2 diabetes mellitus without complications: Secondary | ICD-10-CM | POA: Diagnosis not present

## 2011-06-22 DIAGNOSIS — E785 Hyperlipidemia, unspecified: Secondary | ICD-10-CM | POA: Diagnosis not present

## 2011-06-23 DIAGNOSIS — N186 End stage renal disease: Secondary | ICD-10-CM | POA: Diagnosis not present

## 2011-06-23 DIAGNOSIS — N039 Chronic nephritic syndrome with unspecified morphologic changes: Secondary | ICD-10-CM | POA: Diagnosis not present

## 2011-06-23 DIAGNOSIS — E785 Hyperlipidemia, unspecified: Secondary | ICD-10-CM | POA: Diagnosis not present

## 2011-06-23 DIAGNOSIS — E119 Type 2 diabetes mellitus without complications: Secondary | ICD-10-CM | POA: Diagnosis not present

## 2011-06-23 DIAGNOSIS — D509 Iron deficiency anemia, unspecified: Secondary | ICD-10-CM | POA: Diagnosis not present

## 2011-06-25 DIAGNOSIS — N186 End stage renal disease: Secondary | ICD-10-CM | POA: Diagnosis not present

## 2011-06-27 DIAGNOSIS — N186 End stage renal disease: Secondary | ICD-10-CM | POA: Diagnosis not present

## 2011-06-29 DIAGNOSIS — E878 Other disorders of electrolyte and fluid balance, not elsewhere classified: Secondary | ICD-10-CM | POA: Diagnosis not present

## 2011-06-29 DIAGNOSIS — Z23 Encounter for immunization: Secondary | ICD-10-CM | POA: Diagnosis not present

## 2011-06-29 DIAGNOSIS — D509 Iron deficiency anemia, unspecified: Secondary | ICD-10-CM | POA: Diagnosis not present

## 2011-06-29 DIAGNOSIS — E119 Type 2 diabetes mellitus without complications: Secondary | ICD-10-CM | POA: Diagnosis not present

## 2011-06-29 DIAGNOSIS — D631 Anemia in chronic kidney disease: Secondary | ICD-10-CM | POA: Diagnosis not present

## 2011-06-29 DIAGNOSIS — N186 End stage renal disease: Secondary | ICD-10-CM | POA: Diagnosis not present

## 2011-07-01 DIAGNOSIS — D509 Iron deficiency anemia, unspecified: Secondary | ICD-10-CM | POA: Diagnosis not present

## 2011-07-01 DIAGNOSIS — N186 End stage renal disease: Secondary | ICD-10-CM | POA: Diagnosis not present

## 2011-07-01 DIAGNOSIS — E878 Other disorders of electrolyte and fluid balance, not elsewhere classified: Secondary | ICD-10-CM | POA: Diagnosis not present

## 2011-07-01 DIAGNOSIS — Z23 Encounter for immunization: Secondary | ICD-10-CM | POA: Diagnosis not present

## 2011-07-01 DIAGNOSIS — N039 Chronic nephritic syndrome with unspecified morphologic changes: Secondary | ICD-10-CM | POA: Diagnosis not present

## 2011-07-01 DIAGNOSIS — E119 Type 2 diabetes mellitus without complications: Secondary | ICD-10-CM | POA: Diagnosis not present

## 2011-07-03 DIAGNOSIS — E119 Type 2 diabetes mellitus without complications: Secondary | ICD-10-CM | POA: Diagnosis not present

## 2011-07-03 DIAGNOSIS — N186 End stage renal disease: Secondary | ICD-10-CM | POA: Diagnosis not present

## 2011-07-03 DIAGNOSIS — D509 Iron deficiency anemia, unspecified: Secondary | ICD-10-CM | POA: Diagnosis not present

## 2011-07-03 DIAGNOSIS — Z23 Encounter for immunization: Secondary | ICD-10-CM | POA: Diagnosis not present

## 2011-07-03 DIAGNOSIS — E878 Other disorders of electrolyte and fluid balance, not elsewhere classified: Secondary | ICD-10-CM | POA: Diagnosis not present

## 2011-07-03 DIAGNOSIS — D631 Anemia in chronic kidney disease: Secondary | ICD-10-CM | POA: Diagnosis not present

## 2011-07-06 DIAGNOSIS — D509 Iron deficiency anemia, unspecified: Secondary | ICD-10-CM | POA: Diagnosis not present

## 2011-07-06 DIAGNOSIS — E119 Type 2 diabetes mellitus without complications: Secondary | ICD-10-CM | POA: Diagnosis not present

## 2011-07-06 DIAGNOSIS — Z23 Encounter for immunization: Secondary | ICD-10-CM | POA: Diagnosis not present

## 2011-07-06 DIAGNOSIS — E878 Other disorders of electrolyte and fluid balance, not elsewhere classified: Secondary | ICD-10-CM | POA: Diagnosis not present

## 2011-07-06 DIAGNOSIS — D631 Anemia in chronic kidney disease: Secondary | ICD-10-CM | POA: Diagnosis not present

## 2011-07-06 DIAGNOSIS — N186 End stage renal disease: Secondary | ICD-10-CM | POA: Diagnosis not present

## 2011-07-07 ENCOUNTER — Encounter: Payer: Self-pay | Admitting: Oncology

## 2011-07-08 DIAGNOSIS — E878 Other disorders of electrolyte and fluid balance, not elsewhere classified: Secondary | ICD-10-CM | POA: Diagnosis not present

## 2011-07-08 DIAGNOSIS — D509 Iron deficiency anemia, unspecified: Secondary | ICD-10-CM | POA: Diagnosis not present

## 2011-07-08 DIAGNOSIS — E119 Type 2 diabetes mellitus without complications: Secondary | ICD-10-CM | POA: Diagnosis not present

## 2011-07-08 DIAGNOSIS — Z23 Encounter for immunization: Secondary | ICD-10-CM | POA: Diagnosis not present

## 2011-07-08 DIAGNOSIS — N186 End stage renal disease: Secondary | ICD-10-CM | POA: Diagnosis not present

## 2011-07-08 DIAGNOSIS — N039 Chronic nephritic syndrome with unspecified morphologic changes: Secondary | ICD-10-CM | POA: Diagnosis not present

## 2011-07-10 DIAGNOSIS — D631 Anemia in chronic kidney disease: Secondary | ICD-10-CM | POA: Diagnosis not present

## 2011-07-10 DIAGNOSIS — E119 Type 2 diabetes mellitus without complications: Secondary | ICD-10-CM | POA: Diagnosis not present

## 2011-07-10 DIAGNOSIS — Z23 Encounter for immunization: Secondary | ICD-10-CM | POA: Diagnosis not present

## 2011-07-10 DIAGNOSIS — E878 Other disorders of electrolyte and fluid balance, not elsewhere classified: Secondary | ICD-10-CM | POA: Diagnosis not present

## 2011-07-10 DIAGNOSIS — N186 End stage renal disease: Secondary | ICD-10-CM | POA: Diagnosis not present

## 2011-07-10 DIAGNOSIS — D509 Iron deficiency anemia, unspecified: Secondary | ICD-10-CM | POA: Diagnosis not present

## 2011-07-12 DIAGNOSIS — N186 End stage renal disease: Secondary | ICD-10-CM | POA: Diagnosis not present

## 2011-07-12 DIAGNOSIS — N039 Chronic nephritic syndrome with unspecified morphologic changes: Secondary | ICD-10-CM | POA: Diagnosis not present

## 2011-07-12 DIAGNOSIS — Z23 Encounter for immunization: Secondary | ICD-10-CM | POA: Diagnosis not present

## 2011-07-12 DIAGNOSIS — D509 Iron deficiency anemia, unspecified: Secondary | ICD-10-CM | POA: Diagnosis not present

## 2011-07-12 DIAGNOSIS — E119 Type 2 diabetes mellitus without complications: Secondary | ICD-10-CM | POA: Diagnosis not present

## 2011-07-12 DIAGNOSIS — E878 Other disorders of electrolyte and fluid balance, not elsewhere classified: Secondary | ICD-10-CM | POA: Diagnosis not present

## 2011-07-15 DIAGNOSIS — E878 Other disorders of electrolyte and fluid balance, not elsewhere classified: Secondary | ICD-10-CM | POA: Diagnosis not present

## 2011-07-15 DIAGNOSIS — D509 Iron deficiency anemia, unspecified: Secondary | ICD-10-CM | POA: Diagnosis not present

## 2011-07-15 DIAGNOSIS — N186 End stage renal disease: Secondary | ICD-10-CM | POA: Diagnosis not present

## 2011-07-15 DIAGNOSIS — Z23 Encounter for immunization: Secondary | ICD-10-CM | POA: Diagnosis not present

## 2011-07-15 DIAGNOSIS — E119 Type 2 diabetes mellitus without complications: Secondary | ICD-10-CM | POA: Diagnosis not present

## 2011-07-15 DIAGNOSIS — N039 Chronic nephritic syndrome with unspecified morphologic changes: Secondary | ICD-10-CM | POA: Diagnosis not present

## 2011-07-17 DIAGNOSIS — E878 Other disorders of electrolyte and fluid balance, not elsewhere classified: Secondary | ICD-10-CM | POA: Diagnosis not present

## 2011-07-17 DIAGNOSIS — N186 End stage renal disease: Secondary | ICD-10-CM | POA: Diagnosis not present

## 2011-07-17 DIAGNOSIS — D509 Iron deficiency anemia, unspecified: Secondary | ICD-10-CM | POA: Diagnosis not present

## 2011-07-17 DIAGNOSIS — D631 Anemia in chronic kidney disease: Secondary | ICD-10-CM | POA: Diagnosis not present

## 2011-07-17 DIAGNOSIS — E119 Type 2 diabetes mellitus without complications: Secondary | ICD-10-CM | POA: Diagnosis not present

## 2011-07-17 DIAGNOSIS — Z23 Encounter for immunization: Secondary | ICD-10-CM | POA: Diagnosis not present

## 2011-07-20 DIAGNOSIS — E119 Type 2 diabetes mellitus without complications: Secondary | ICD-10-CM | POA: Diagnosis not present

## 2011-07-20 DIAGNOSIS — E878 Other disorders of electrolyte and fluid balance, not elsewhere classified: Secondary | ICD-10-CM | POA: Diagnosis not present

## 2011-07-20 DIAGNOSIS — N186 End stage renal disease: Secondary | ICD-10-CM | POA: Diagnosis not present

## 2011-07-20 DIAGNOSIS — Z23 Encounter for immunization: Secondary | ICD-10-CM | POA: Diagnosis not present

## 2011-07-20 DIAGNOSIS — D509 Iron deficiency anemia, unspecified: Secondary | ICD-10-CM | POA: Diagnosis not present

## 2011-07-20 DIAGNOSIS — D631 Anemia in chronic kidney disease: Secondary | ICD-10-CM | POA: Diagnosis not present

## 2011-07-22 DIAGNOSIS — D509 Iron deficiency anemia, unspecified: Secondary | ICD-10-CM | POA: Diagnosis not present

## 2011-07-22 DIAGNOSIS — E119 Type 2 diabetes mellitus without complications: Secondary | ICD-10-CM | POA: Diagnosis not present

## 2011-07-22 DIAGNOSIS — N186 End stage renal disease: Secondary | ICD-10-CM | POA: Diagnosis not present

## 2011-07-22 DIAGNOSIS — E878 Other disorders of electrolyte and fluid balance, not elsewhere classified: Secondary | ICD-10-CM | POA: Diagnosis not present

## 2011-07-22 DIAGNOSIS — D631 Anemia in chronic kidney disease: Secondary | ICD-10-CM | POA: Diagnosis not present

## 2011-07-22 DIAGNOSIS — Z23 Encounter for immunization: Secondary | ICD-10-CM | POA: Diagnosis not present

## 2011-07-24 DIAGNOSIS — E878 Other disorders of electrolyte and fluid balance, not elsewhere classified: Secondary | ICD-10-CM | POA: Diagnosis not present

## 2011-07-24 DIAGNOSIS — E119 Type 2 diabetes mellitus without complications: Secondary | ICD-10-CM | POA: Diagnosis not present

## 2011-07-24 DIAGNOSIS — Z23 Encounter for immunization: Secondary | ICD-10-CM | POA: Diagnosis not present

## 2011-07-24 DIAGNOSIS — N039 Chronic nephritic syndrome with unspecified morphologic changes: Secondary | ICD-10-CM | POA: Diagnosis not present

## 2011-07-24 DIAGNOSIS — N186 End stage renal disease: Secondary | ICD-10-CM | POA: Diagnosis not present

## 2011-07-24 DIAGNOSIS — D509 Iron deficiency anemia, unspecified: Secondary | ICD-10-CM | POA: Diagnosis not present

## 2011-07-27 DIAGNOSIS — E119 Type 2 diabetes mellitus without complications: Secondary | ICD-10-CM | POA: Diagnosis not present

## 2011-07-27 DIAGNOSIS — E878 Other disorders of electrolyte and fluid balance, not elsewhere classified: Secondary | ICD-10-CM | POA: Diagnosis not present

## 2011-07-27 DIAGNOSIS — Z23 Encounter for immunization: Secondary | ICD-10-CM | POA: Diagnosis not present

## 2011-07-27 DIAGNOSIS — D631 Anemia in chronic kidney disease: Secondary | ICD-10-CM | POA: Diagnosis not present

## 2011-07-27 DIAGNOSIS — N186 End stage renal disease: Secondary | ICD-10-CM | POA: Diagnosis not present

## 2011-07-27 DIAGNOSIS — D509 Iron deficiency anemia, unspecified: Secondary | ICD-10-CM | POA: Diagnosis not present

## 2011-07-29 DIAGNOSIS — N186 End stage renal disease: Secondary | ICD-10-CM | POA: Diagnosis not present

## 2011-07-29 DIAGNOSIS — D509 Iron deficiency anemia, unspecified: Secondary | ICD-10-CM | POA: Diagnosis not present

## 2011-07-29 DIAGNOSIS — D631 Anemia in chronic kidney disease: Secondary | ICD-10-CM | POA: Diagnosis not present

## 2011-07-29 DIAGNOSIS — E878 Other disorders of electrolyte and fluid balance, not elsewhere classified: Secondary | ICD-10-CM | POA: Diagnosis not present

## 2011-07-29 DIAGNOSIS — E119 Type 2 diabetes mellitus without complications: Secondary | ICD-10-CM | POA: Diagnosis not present

## 2011-07-31 DIAGNOSIS — N039 Chronic nephritic syndrome with unspecified morphologic changes: Secondary | ICD-10-CM | POA: Diagnosis not present

## 2011-07-31 DIAGNOSIS — E878 Other disorders of electrolyte and fluid balance, not elsewhere classified: Secondary | ICD-10-CM | POA: Diagnosis not present

## 2011-07-31 DIAGNOSIS — N186 End stage renal disease: Secondary | ICD-10-CM | POA: Diagnosis not present

## 2011-07-31 DIAGNOSIS — D509 Iron deficiency anemia, unspecified: Secondary | ICD-10-CM | POA: Diagnosis not present

## 2011-07-31 DIAGNOSIS — E119 Type 2 diabetes mellitus without complications: Secondary | ICD-10-CM | POA: Diagnosis not present

## 2011-08-03 DIAGNOSIS — N186 End stage renal disease: Secondary | ICD-10-CM | POA: Diagnosis not present

## 2011-08-03 DIAGNOSIS — D509 Iron deficiency anemia, unspecified: Secondary | ICD-10-CM | POA: Diagnosis not present

## 2011-08-03 DIAGNOSIS — D631 Anemia in chronic kidney disease: Secondary | ICD-10-CM | POA: Diagnosis not present

## 2011-08-03 DIAGNOSIS — E878 Other disorders of electrolyte and fluid balance, not elsewhere classified: Secondary | ICD-10-CM | POA: Diagnosis not present

## 2011-08-03 DIAGNOSIS — E119 Type 2 diabetes mellitus without complications: Secondary | ICD-10-CM | POA: Diagnosis not present

## 2011-08-05 DIAGNOSIS — D631 Anemia in chronic kidney disease: Secondary | ICD-10-CM | POA: Diagnosis not present

## 2011-08-05 DIAGNOSIS — E878 Other disorders of electrolyte and fluid balance, not elsewhere classified: Secondary | ICD-10-CM | POA: Diagnosis not present

## 2011-08-05 DIAGNOSIS — N186 End stage renal disease: Secondary | ICD-10-CM | POA: Diagnosis not present

## 2011-08-05 DIAGNOSIS — D509 Iron deficiency anemia, unspecified: Secondary | ICD-10-CM | POA: Diagnosis not present

## 2011-08-05 DIAGNOSIS — E119 Type 2 diabetes mellitus without complications: Secondary | ICD-10-CM | POA: Diagnosis not present

## 2011-08-07 DIAGNOSIS — N039 Chronic nephritic syndrome with unspecified morphologic changes: Secondary | ICD-10-CM | POA: Diagnosis not present

## 2011-08-07 DIAGNOSIS — E878 Other disorders of electrolyte and fluid balance, not elsewhere classified: Secondary | ICD-10-CM | POA: Diagnosis not present

## 2011-08-07 DIAGNOSIS — N186 End stage renal disease: Secondary | ICD-10-CM | POA: Diagnosis not present

## 2011-08-07 DIAGNOSIS — E119 Type 2 diabetes mellitus without complications: Secondary | ICD-10-CM | POA: Diagnosis not present

## 2011-08-07 DIAGNOSIS — D509 Iron deficiency anemia, unspecified: Secondary | ICD-10-CM | POA: Diagnosis not present

## 2011-08-10 DIAGNOSIS — D509 Iron deficiency anemia, unspecified: Secondary | ICD-10-CM | POA: Diagnosis not present

## 2011-08-10 DIAGNOSIS — E878 Other disorders of electrolyte and fluid balance, not elsewhere classified: Secondary | ICD-10-CM | POA: Diagnosis not present

## 2011-08-10 DIAGNOSIS — E119 Type 2 diabetes mellitus without complications: Secondary | ICD-10-CM | POA: Diagnosis not present

## 2011-08-10 DIAGNOSIS — D631 Anemia in chronic kidney disease: Secondary | ICD-10-CM | POA: Diagnosis not present

## 2011-08-10 DIAGNOSIS — N186 End stage renal disease: Secondary | ICD-10-CM | POA: Diagnosis not present

## 2011-08-12 DIAGNOSIS — D631 Anemia in chronic kidney disease: Secondary | ICD-10-CM | POA: Diagnosis not present

## 2011-08-12 DIAGNOSIS — D509 Iron deficiency anemia, unspecified: Secondary | ICD-10-CM | POA: Diagnosis not present

## 2011-08-12 DIAGNOSIS — N186 End stage renal disease: Secondary | ICD-10-CM | POA: Diagnosis not present

## 2011-08-12 DIAGNOSIS — E119 Type 2 diabetes mellitus without complications: Secondary | ICD-10-CM | POA: Diagnosis not present

## 2011-08-12 DIAGNOSIS — E878 Other disorders of electrolyte and fluid balance, not elsewhere classified: Secondary | ICD-10-CM | POA: Diagnosis not present

## 2011-08-14 DIAGNOSIS — E119 Type 2 diabetes mellitus without complications: Secondary | ICD-10-CM | POA: Diagnosis not present

## 2011-08-14 DIAGNOSIS — E878 Other disorders of electrolyte and fluid balance, not elsewhere classified: Secondary | ICD-10-CM | POA: Diagnosis not present

## 2011-08-14 DIAGNOSIS — N186 End stage renal disease: Secondary | ICD-10-CM | POA: Diagnosis not present

## 2011-08-14 DIAGNOSIS — D509 Iron deficiency anemia, unspecified: Secondary | ICD-10-CM | POA: Diagnosis not present

## 2011-08-14 DIAGNOSIS — N039 Chronic nephritic syndrome with unspecified morphologic changes: Secondary | ICD-10-CM | POA: Diagnosis not present

## 2011-08-17 DIAGNOSIS — D509 Iron deficiency anemia, unspecified: Secondary | ICD-10-CM | POA: Diagnosis not present

## 2011-08-17 DIAGNOSIS — N186 End stage renal disease: Secondary | ICD-10-CM | POA: Diagnosis not present

## 2011-08-17 DIAGNOSIS — E878 Other disorders of electrolyte and fluid balance, not elsewhere classified: Secondary | ICD-10-CM | POA: Diagnosis not present

## 2011-08-17 DIAGNOSIS — N039 Chronic nephritic syndrome with unspecified morphologic changes: Secondary | ICD-10-CM | POA: Diagnosis not present

## 2011-08-17 DIAGNOSIS — E119 Type 2 diabetes mellitus without complications: Secondary | ICD-10-CM | POA: Diagnosis not present

## 2011-08-19 DIAGNOSIS — E878 Other disorders of electrolyte and fluid balance, not elsewhere classified: Secondary | ICD-10-CM | POA: Diagnosis not present

## 2011-08-19 DIAGNOSIS — N039 Chronic nephritic syndrome with unspecified morphologic changes: Secondary | ICD-10-CM | POA: Diagnosis not present

## 2011-08-19 DIAGNOSIS — E119 Type 2 diabetes mellitus without complications: Secondary | ICD-10-CM | POA: Diagnosis not present

## 2011-08-19 DIAGNOSIS — D509 Iron deficiency anemia, unspecified: Secondary | ICD-10-CM | POA: Diagnosis not present

## 2011-08-19 DIAGNOSIS — N186 End stage renal disease: Secondary | ICD-10-CM | POA: Diagnosis not present

## 2011-08-21 DIAGNOSIS — E119 Type 2 diabetes mellitus without complications: Secondary | ICD-10-CM | POA: Diagnosis not present

## 2011-08-21 DIAGNOSIS — D509 Iron deficiency anemia, unspecified: Secondary | ICD-10-CM | POA: Diagnosis not present

## 2011-08-21 DIAGNOSIS — N039 Chronic nephritic syndrome with unspecified morphologic changes: Secondary | ICD-10-CM | POA: Diagnosis not present

## 2011-08-21 DIAGNOSIS — N186 End stage renal disease: Secondary | ICD-10-CM | POA: Diagnosis not present

## 2011-08-21 DIAGNOSIS — E878 Other disorders of electrolyte and fluid balance, not elsewhere classified: Secondary | ICD-10-CM | POA: Diagnosis not present

## 2011-08-24 DIAGNOSIS — D631 Anemia in chronic kidney disease: Secondary | ICD-10-CM | POA: Diagnosis not present

## 2011-08-24 DIAGNOSIS — N039 Chronic nephritic syndrome with unspecified morphologic changes: Secondary | ICD-10-CM | POA: Diagnosis not present

## 2011-08-24 DIAGNOSIS — D509 Iron deficiency anemia, unspecified: Secondary | ICD-10-CM | POA: Diagnosis not present

## 2011-08-24 DIAGNOSIS — E878 Other disorders of electrolyte and fluid balance, not elsewhere classified: Secondary | ICD-10-CM | POA: Diagnosis not present

## 2011-08-24 DIAGNOSIS — E119 Type 2 diabetes mellitus without complications: Secondary | ICD-10-CM | POA: Diagnosis not present

## 2011-08-24 DIAGNOSIS — N186 End stage renal disease: Secondary | ICD-10-CM | POA: Diagnosis not present

## 2011-08-26 DIAGNOSIS — N186 End stage renal disease: Secondary | ICD-10-CM | POA: Diagnosis not present

## 2011-08-26 DIAGNOSIS — E878 Other disorders of electrolyte and fluid balance, not elsewhere classified: Secondary | ICD-10-CM | POA: Diagnosis not present

## 2011-08-26 DIAGNOSIS — D631 Anemia in chronic kidney disease: Secondary | ICD-10-CM | POA: Diagnosis not present

## 2011-08-26 DIAGNOSIS — E119 Type 2 diabetes mellitus without complications: Secondary | ICD-10-CM | POA: Diagnosis not present

## 2011-08-26 DIAGNOSIS — D509 Iron deficiency anemia, unspecified: Secondary | ICD-10-CM | POA: Diagnosis not present

## 2011-08-27 DIAGNOSIS — N186 End stage renal disease: Secondary | ICD-10-CM | POA: Diagnosis not present

## 2011-08-28 DIAGNOSIS — D649 Anemia, unspecified: Secondary | ICD-10-CM | POA: Diagnosis not present

## 2011-08-28 DIAGNOSIS — E785 Hyperlipidemia, unspecified: Secondary | ICD-10-CM | POA: Diagnosis not present

## 2011-08-28 DIAGNOSIS — N2581 Secondary hyperparathyroidism of renal origin: Secondary | ICD-10-CM | POA: Diagnosis not present

## 2011-08-28 DIAGNOSIS — D509 Iron deficiency anemia, unspecified: Secondary | ICD-10-CM | POA: Diagnosis not present

## 2011-08-28 DIAGNOSIS — Z992 Dependence on renal dialysis: Secondary | ICD-10-CM | POA: Diagnosis not present

## 2011-08-28 DIAGNOSIS — E878 Other disorders of electrolyte and fluid balance, not elsewhere classified: Secondary | ICD-10-CM | POA: Diagnosis not present

## 2011-08-28 DIAGNOSIS — D631 Anemia in chronic kidney disease: Secondary | ICD-10-CM | POA: Diagnosis not present

## 2011-08-28 DIAGNOSIS — N186 End stage renal disease: Secondary | ICD-10-CM | POA: Diagnosis not present

## 2011-08-28 DIAGNOSIS — E119 Type 2 diabetes mellitus without complications: Secondary | ICD-10-CM | POA: Diagnosis not present

## 2011-08-31 DIAGNOSIS — N039 Chronic nephritic syndrome with unspecified morphologic changes: Secondary | ICD-10-CM | POA: Diagnosis not present

## 2011-08-31 DIAGNOSIS — Z992 Dependence on renal dialysis: Secondary | ICD-10-CM | POA: Diagnosis not present

## 2011-08-31 DIAGNOSIS — E119 Type 2 diabetes mellitus without complications: Secondary | ICD-10-CM | POA: Diagnosis not present

## 2011-08-31 DIAGNOSIS — E785 Hyperlipidemia, unspecified: Secondary | ICD-10-CM | POA: Diagnosis not present

## 2011-08-31 DIAGNOSIS — D509 Iron deficiency anemia, unspecified: Secondary | ICD-10-CM | POA: Diagnosis not present

## 2011-08-31 DIAGNOSIS — N186 End stage renal disease: Secondary | ICD-10-CM | POA: Diagnosis not present

## 2011-09-02 DIAGNOSIS — E119 Type 2 diabetes mellitus without complications: Secondary | ICD-10-CM | POA: Diagnosis not present

## 2011-09-02 DIAGNOSIS — D509 Iron deficiency anemia, unspecified: Secondary | ICD-10-CM | POA: Diagnosis not present

## 2011-09-02 DIAGNOSIS — N186 End stage renal disease: Secondary | ICD-10-CM | POA: Diagnosis not present

## 2011-09-02 DIAGNOSIS — N039 Chronic nephritic syndrome with unspecified morphologic changes: Secondary | ICD-10-CM | POA: Diagnosis not present

## 2011-09-02 DIAGNOSIS — E785 Hyperlipidemia, unspecified: Secondary | ICD-10-CM | POA: Diagnosis not present

## 2011-09-04 DIAGNOSIS — N039 Chronic nephritic syndrome with unspecified morphologic changes: Secondary | ICD-10-CM | POA: Diagnosis not present

## 2011-09-04 DIAGNOSIS — D509 Iron deficiency anemia, unspecified: Secondary | ICD-10-CM | POA: Diagnosis not present

## 2011-09-04 DIAGNOSIS — D631 Anemia in chronic kidney disease: Secondary | ICD-10-CM | POA: Diagnosis not present

## 2011-09-04 DIAGNOSIS — E785 Hyperlipidemia, unspecified: Secondary | ICD-10-CM | POA: Diagnosis not present

## 2011-09-04 DIAGNOSIS — N186 End stage renal disease: Secondary | ICD-10-CM | POA: Diagnosis not present

## 2011-09-04 DIAGNOSIS — E119 Type 2 diabetes mellitus without complications: Secondary | ICD-10-CM | POA: Diagnosis not present

## 2011-09-07 DIAGNOSIS — E785 Hyperlipidemia, unspecified: Secondary | ICD-10-CM | POA: Diagnosis not present

## 2011-09-07 DIAGNOSIS — N186 End stage renal disease: Secondary | ICD-10-CM | POA: Diagnosis not present

## 2011-09-07 DIAGNOSIS — D509 Iron deficiency anemia, unspecified: Secondary | ICD-10-CM | POA: Diagnosis not present

## 2011-09-07 DIAGNOSIS — D631 Anemia in chronic kidney disease: Secondary | ICD-10-CM | POA: Diagnosis not present

## 2011-09-07 DIAGNOSIS — E119 Type 2 diabetes mellitus without complications: Secondary | ICD-10-CM | POA: Diagnosis not present

## 2011-09-07 DIAGNOSIS — N039 Chronic nephritic syndrome with unspecified morphologic changes: Secondary | ICD-10-CM | POA: Diagnosis not present

## 2011-09-09 DIAGNOSIS — E119 Type 2 diabetes mellitus without complications: Secondary | ICD-10-CM | POA: Diagnosis not present

## 2011-09-09 DIAGNOSIS — N186 End stage renal disease: Secondary | ICD-10-CM | POA: Diagnosis not present

## 2011-09-09 DIAGNOSIS — E785 Hyperlipidemia, unspecified: Secondary | ICD-10-CM | POA: Diagnosis not present

## 2011-09-09 DIAGNOSIS — N039 Chronic nephritic syndrome with unspecified morphologic changes: Secondary | ICD-10-CM | POA: Diagnosis not present

## 2011-09-09 DIAGNOSIS — D509 Iron deficiency anemia, unspecified: Secondary | ICD-10-CM | POA: Diagnosis not present

## 2011-09-10 DIAGNOSIS — N186 End stage renal disease: Secondary | ICD-10-CM | POA: Diagnosis not present

## 2011-09-10 DIAGNOSIS — D509 Iron deficiency anemia, unspecified: Secondary | ICD-10-CM | POA: Diagnosis not present

## 2011-09-10 DIAGNOSIS — E785 Hyperlipidemia, unspecified: Secondary | ICD-10-CM | POA: Diagnosis not present

## 2011-09-10 DIAGNOSIS — E119 Type 2 diabetes mellitus without complications: Secondary | ICD-10-CM | POA: Diagnosis not present

## 2011-09-10 DIAGNOSIS — D631 Anemia in chronic kidney disease: Secondary | ICD-10-CM | POA: Diagnosis not present

## 2011-09-13 DIAGNOSIS — N186 End stage renal disease: Secondary | ICD-10-CM | POA: Diagnosis not present

## 2011-09-16 DIAGNOSIS — D509 Iron deficiency anemia, unspecified: Secondary | ICD-10-CM | POA: Diagnosis not present

## 2011-09-16 DIAGNOSIS — N039 Chronic nephritic syndrome with unspecified morphologic changes: Secondary | ICD-10-CM | POA: Diagnosis not present

## 2011-09-16 DIAGNOSIS — E119 Type 2 diabetes mellitus without complications: Secondary | ICD-10-CM | POA: Diagnosis not present

## 2011-09-16 DIAGNOSIS — N186 End stage renal disease: Secondary | ICD-10-CM | POA: Diagnosis not present

## 2011-09-16 DIAGNOSIS — Z992 Dependence on renal dialysis: Secondary | ICD-10-CM | POA: Diagnosis not present

## 2011-09-16 DIAGNOSIS — E1129 Type 2 diabetes mellitus with other diabetic kidney complication: Secondary | ICD-10-CM | POA: Diagnosis not present

## 2011-09-16 DIAGNOSIS — E785 Hyperlipidemia, unspecified: Secondary | ICD-10-CM | POA: Diagnosis not present

## 2011-09-18 DIAGNOSIS — E785 Hyperlipidemia, unspecified: Secondary | ICD-10-CM | POA: Diagnosis not present

## 2011-09-18 DIAGNOSIS — D631 Anemia in chronic kidney disease: Secondary | ICD-10-CM | POA: Diagnosis not present

## 2011-09-18 DIAGNOSIS — N186 End stage renal disease: Secondary | ICD-10-CM | POA: Diagnosis not present

## 2011-09-18 DIAGNOSIS — D509 Iron deficiency anemia, unspecified: Secondary | ICD-10-CM | POA: Diagnosis not present

## 2011-09-18 DIAGNOSIS — E119 Type 2 diabetes mellitus without complications: Secondary | ICD-10-CM | POA: Diagnosis not present

## 2011-09-21 DIAGNOSIS — E785 Hyperlipidemia, unspecified: Secondary | ICD-10-CM | POA: Diagnosis not present

## 2011-09-21 DIAGNOSIS — N186 End stage renal disease: Secondary | ICD-10-CM | POA: Diagnosis not present

## 2011-09-21 DIAGNOSIS — E119 Type 2 diabetes mellitus without complications: Secondary | ICD-10-CM | POA: Diagnosis not present

## 2011-09-21 DIAGNOSIS — D509 Iron deficiency anemia, unspecified: Secondary | ICD-10-CM | POA: Diagnosis not present

## 2011-09-21 DIAGNOSIS — D631 Anemia in chronic kidney disease: Secondary | ICD-10-CM | POA: Diagnosis not present

## 2011-09-23 DIAGNOSIS — N186 End stage renal disease: Secondary | ICD-10-CM | POA: Diagnosis not present

## 2011-09-23 DIAGNOSIS — E119 Type 2 diabetes mellitus without complications: Secondary | ICD-10-CM | POA: Diagnosis not present

## 2011-09-23 DIAGNOSIS — D509 Iron deficiency anemia, unspecified: Secondary | ICD-10-CM | POA: Diagnosis not present

## 2011-09-23 DIAGNOSIS — N039 Chronic nephritic syndrome with unspecified morphologic changes: Secondary | ICD-10-CM | POA: Diagnosis not present

## 2011-09-23 DIAGNOSIS — E785 Hyperlipidemia, unspecified: Secondary | ICD-10-CM | POA: Diagnosis not present

## 2011-09-25 DIAGNOSIS — D631 Anemia in chronic kidney disease: Secondary | ICD-10-CM | POA: Diagnosis not present

## 2011-09-25 DIAGNOSIS — N186 End stage renal disease: Secondary | ICD-10-CM | POA: Diagnosis not present

## 2011-09-25 DIAGNOSIS — D509 Iron deficiency anemia, unspecified: Secondary | ICD-10-CM | POA: Diagnosis not present

## 2011-09-25 DIAGNOSIS — E119 Type 2 diabetes mellitus without complications: Secondary | ICD-10-CM | POA: Diagnosis not present

## 2011-09-25 DIAGNOSIS — E785 Hyperlipidemia, unspecified: Secondary | ICD-10-CM | POA: Diagnosis not present

## 2011-09-26 DIAGNOSIS — N186 End stage renal disease: Secondary | ICD-10-CM | POA: Diagnosis not present

## 2011-09-27 DEATH — deceased

## 2011-09-28 DIAGNOSIS — D509 Iron deficiency anemia, unspecified: Secondary | ICD-10-CM | POA: Diagnosis not present

## 2011-09-28 DIAGNOSIS — E119 Type 2 diabetes mellitus without complications: Secondary | ICD-10-CM | POA: Diagnosis not present

## 2011-09-28 DIAGNOSIS — N186 End stage renal disease: Secondary | ICD-10-CM | POA: Diagnosis not present

## 2011-09-28 DIAGNOSIS — E878 Other disorders of electrolyte and fluid balance, not elsewhere classified: Secondary | ICD-10-CM | POA: Diagnosis not present

## 2011-09-28 DIAGNOSIS — D631 Anemia in chronic kidney disease: Secondary | ICD-10-CM | POA: Diagnosis not present

## 2011-09-28 DIAGNOSIS — Z23 Encounter for immunization: Secondary | ICD-10-CM | POA: Diagnosis not present

## 2011-09-30 DIAGNOSIS — N039 Chronic nephritic syndrome with unspecified morphologic changes: Secondary | ICD-10-CM | POA: Diagnosis not present

## 2011-09-30 DIAGNOSIS — D509 Iron deficiency anemia, unspecified: Secondary | ICD-10-CM | POA: Diagnosis not present

## 2011-09-30 DIAGNOSIS — N186 End stage renal disease: Secondary | ICD-10-CM | POA: Diagnosis not present

## 2011-09-30 DIAGNOSIS — E119 Type 2 diabetes mellitus without complications: Secondary | ICD-10-CM | POA: Diagnosis not present

## 2011-09-30 DIAGNOSIS — Z23 Encounter for immunization: Secondary | ICD-10-CM | POA: Diagnosis not present

## 2011-09-30 DIAGNOSIS — E878 Other disorders of electrolyte and fluid balance, not elsewhere classified: Secondary | ICD-10-CM | POA: Diagnosis not present

## 2011-10-02 DIAGNOSIS — N186 End stage renal disease: Secondary | ICD-10-CM | POA: Diagnosis not present

## 2011-10-02 DIAGNOSIS — D509 Iron deficiency anemia, unspecified: Secondary | ICD-10-CM | POA: Diagnosis not present

## 2011-10-02 DIAGNOSIS — D631 Anemia in chronic kidney disease: Secondary | ICD-10-CM | POA: Diagnosis not present

## 2011-10-02 DIAGNOSIS — E878 Other disorders of electrolyte and fluid balance, not elsewhere classified: Secondary | ICD-10-CM | POA: Diagnosis not present

## 2011-10-02 DIAGNOSIS — Z23 Encounter for immunization: Secondary | ICD-10-CM | POA: Diagnosis not present

## 2011-10-02 DIAGNOSIS — E119 Type 2 diabetes mellitus without complications: Secondary | ICD-10-CM | POA: Diagnosis not present

## 2011-10-05 DIAGNOSIS — Z23 Encounter for immunization: Secondary | ICD-10-CM | POA: Diagnosis not present

## 2011-10-05 DIAGNOSIS — E878 Other disorders of electrolyte and fluid balance, not elsewhere classified: Secondary | ICD-10-CM | POA: Diagnosis not present

## 2011-10-05 DIAGNOSIS — D631 Anemia in chronic kidney disease: Secondary | ICD-10-CM | POA: Diagnosis not present

## 2011-10-05 DIAGNOSIS — E119 Type 2 diabetes mellitus without complications: Secondary | ICD-10-CM | POA: Diagnosis not present

## 2011-10-05 DIAGNOSIS — N186 End stage renal disease: Secondary | ICD-10-CM | POA: Diagnosis not present

## 2011-10-05 DIAGNOSIS — D509 Iron deficiency anemia, unspecified: Secondary | ICD-10-CM | POA: Diagnosis not present

## 2011-10-07 ENCOUNTER — Other Ambulatory Visit: Payer: Self-pay

## 2011-10-07 DIAGNOSIS — D509 Iron deficiency anemia, unspecified: Secondary | ICD-10-CM | POA: Diagnosis not present

## 2011-10-07 DIAGNOSIS — Z23 Encounter for immunization: Secondary | ICD-10-CM | POA: Diagnosis not present

## 2011-10-07 DIAGNOSIS — N186 End stage renal disease: Secondary | ICD-10-CM | POA: Diagnosis not present

## 2011-10-07 DIAGNOSIS — T82898A Other specified complication of vascular prosthetic devices, implants and grafts, initial encounter: Secondary | ICD-10-CM

## 2011-10-07 DIAGNOSIS — E878 Other disorders of electrolyte and fluid balance, not elsewhere classified: Secondary | ICD-10-CM | POA: Diagnosis not present

## 2011-10-07 DIAGNOSIS — E119 Type 2 diabetes mellitus without complications: Secondary | ICD-10-CM | POA: Diagnosis not present

## 2011-10-07 DIAGNOSIS — D631 Anemia in chronic kidney disease: Secondary | ICD-10-CM | POA: Diagnosis not present

## 2011-10-09 DIAGNOSIS — N186 End stage renal disease: Secondary | ICD-10-CM | POA: Diagnosis not present

## 2011-10-09 DIAGNOSIS — N039 Chronic nephritic syndrome with unspecified morphologic changes: Secondary | ICD-10-CM | POA: Diagnosis not present

## 2011-10-09 DIAGNOSIS — E119 Type 2 diabetes mellitus without complications: Secondary | ICD-10-CM | POA: Diagnosis not present

## 2011-10-09 DIAGNOSIS — D509 Iron deficiency anemia, unspecified: Secondary | ICD-10-CM | POA: Diagnosis not present

## 2011-10-09 DIAGNOSIS — Z23 Encounter for immunization: Secondary | ICD-10-CM | POA: Diagnosis not present

## 2011-10-09 DIAGNOSIS — E878 Other disorders of electrolyte and fluid balance, not elsewhere classified: Secondary | ICD-10-CM | POA: Diagnosis not present

## 2011-10-12 DIAGNOSIS — E878 Other disorders of electrolyte and fluid balance, not elsewhere classified: Secondary | ICD-10-CM | POA: Diagnosis not present

## 2011-10-12 DIAGNOSIS — D631 Anemia in chronic kidney disease: Secondary | ICD-10-CM | POA: Diagnosis not present

## 2011-10-12 DIAGNOSIS — E119 Type 2 diabetes mellitus without complications: Secondary | ICD-10-CM | POA: Diagnosis not present

## 2011-10-12 DIAGNOSIS — D509 Iron deficiency anemia, unspecified: Secondary | ICD-10-CM | POA: Diagnosis not present

## 2011-10-12 DIAGNOSIS — Z23 Encounter for immunization: Secondary | ICD-10-CM | POA: Diagnosis not present

## 2011-10-12 DIAGNOSIS — N186 End stage renal disease: Secondary | ICD-10-CM | POA: Diagnosis not present

## 2011-10-14 DIAGNOSIS — D631 Anemia in chronic kidney disease: Secondary | ICD-10-CM | POA: Diagnosis not present

## 2011-10-14 DIAGNOSIS — D509 Iron deficiency anemia, unspecified: Secondary | ICD-10-CM | POA: Diagnosis not present

## 2011-10-14 DIAGNOSIS — E119 Type 2 diabetes mellitus without complications: Secondary | ICD-10-CM | POA: Diagnosis not present

## 2011-10-14 DIAGNOSIS — E878 Other disorders of electrolyte and fluid balance, not elsewhere classified: Secondary | ICD-10-CM | POA: Diagnosis not present

## 2011-10-14 DIAGNOSIS — Z23 Encounter for immunization: Secondary | ICD-10-CM | POA: Diagnosis not present

## 2011-10-14 DIAGNOSIS — N186 End stage renal disease: Secondary | ICD-10-CM | POA: Diagnosis not present

## 2011-10-16 DIAGNOSIS — N039 Chronic nephritic syndrome with unspecified morphologic changes: Secondary | ICD-10-CM | POA: Diagnosis not present

## 2011-10-16 DIAGNOSIS — N186 End stage renal disease: Secondary | ICD-10-CM | POA: Diagnosis not present

## 2011-10-16 DIAGNOSIS — D509 Iron deficiency anemia, unspecified: Secondary | ICD-10-CM | POA: Diagnosis not present

## 2011-10-16 DIAGNOSIS — Z23 Encounter for immunization: Secondary | ICD-10-CM | POA: Diagnosis not present

## 2011-10-16 DIAGNOSIS — E119 Type 2 diabetes mellitus without complications: Secondary | ICD-10-CM | POA: Diagnosis not present

## 2011-10-16 DIAGNOSIS — E878 Other disorders of electrolyte and fluid balance, not elsewhere classified: Secondary | ICD-10-CM | POA: Diagnosis not present

## 2011-10-19 DIAGNOSIS — D509 Iron deficiency anemia, unspecified: Secondary | ICD-10-CM | POA: Diagnosis not present

## 2011-10-19 DIAGNOSIS — D631 Anemia in chronic kidney disease: Secondary | ICD-10-CM | POA: Diagnosis not present

## 2011-10-19 DIAGNOSIS — E878 Other disorders of electrolyte and fluid balance, not elsewhere classified: Secondary | ICD-10-CM | POA: Diagnosis not present

## 2011-10-19 DIAGNOSIS — E119 Type 2 diabetes mellitus without complications: Secondary | ICD-10-CM | POA: Diagnosis not present

## 2011-10-19 DIAGNOSIS — N186 End stage renal disease: Secondary | ICD-10-CM | POA: Diagnosis not present

## 2011-10-19 DIAGNOSIS — Z23 Encounter for immunization: Secondary | ICD-10-CM | POA: Diagnosis not present

## 2011-10-21 DIAGNOSIS — Z23 Encounter for immunization: Secondary | ICD-10-CM | POA: Diagnosis not present

## 2011-10-21 DIAGNOSIS — D509 Iron deficiency anemia, unspecified: Secondary | ICD-10-CM | POA: Diagnosis not present

## 2011-10-21 DIAGNOSIS — E878 Other disorders of electrolyte and fluid balance, not elsewhere classified: Secondary | ICD-10-CM | POA: Diagnosis not present

## 2011-10-21 DIAGNOSIS — N039 Chronic nephritic syndrome with unspecified morphologic changes: Secondary | ICD-10-CM | POA: Diagnosis not present

## 2011-10-21 DIAGNOSIS — E119 Type 2 diabetes mellitus without complications: Secondary | ICD-10-CM | POA: Diagnosis not present

## 2011-10-21 DIAGNOSIS — N186 End stage renal disease: Secondary | ICD-10-CM | POA: Diagnosis not present

## 2011-10-23 DIAGNOSIS — D509 Iron deficiency anemia, unspecified: Secondary | ICD-10-CM | POA: Diagnosis not present

## 2011-10-23 DIAGNOSIS — D631 Anemia in chronic kidney disease: Secondary | ICD-10-CM | POA: Diagnosis not present

## 2011-10-23 DIAGNOSIS — E119 Type 2 diabetes mellitus without complications: Secondary | ICD-10-CM | POA: Diagnosis not present

## 2011-10-23 DIAGNOSIS — Z23 Encounter for immunization: Secondary | ICD-10-CM | POA: Diagnosis not present

## 2011-10-23 DIAGNOSIS — E878 Other disorders of electrolyte and fluid balance, not elsewhere classified: Secondary | ICD-10-CM | POA: Diagnosis not present

## 2011-10-23 DIAGNOSIS — N186 End stage renal disease: Secondary | ICD-10-CM | POA: Diagnosis not present

## 2011-10-26 DIAGNOSIS — E878 Other disorders of electrolyte and fluid balance, not elsewhere classified: Secondary | ICD-10-CM | POA: Diagnosis not present

## 2011-10-26 DIAGNOSIS — D509 Iron deficiency anemia, unspecified: Secondary | ICD-10-CM | POA: Diagnosis not present

## 2011-10-26 DIAGNOSIS — N039 Chronic nephritic syndrome with unspecified morphologic changes: Secondary | ICD-10-CM | POA: Diagnosis not present

## 2011-10-26 DIAGNOSIS — Z23 Encounter for immunization: Secondary | ICD-10-CM | POA: Diagnosis not present

## 2011-10-26 DIAGNOSIS — N186 End stage renal disease: Secondary | ICD-10-CM | POA: Diagnosis not present

## 2011-10-26 DIAGNOSIS — E119 Type 2 diabetes mellitus without complications: Secondary | ICD-10-CM | POA: Diagnosis not present

## 2011-10-27 ENCOUNTER — Encounter: Payer: Self-pay | Admitting: Vascular Surgery

## 2011-10-27 DIAGNOSIS — N186 End stage renal disease: Secondary | ICD-10-CM | POA: Diagnosis not present

## 2011-10-28 ENCOUNTER — Ambulatory Visit: Payer: Medicare Other | Admitting: Vascular Surgery

## 2011-10-28 ENCOUNTER — Encounter: Payer: Self-pay | Admitting: Vascular Surgery

## 2011-10-28 ENCOUNTER — Ambulatory Visit (INDEPENDENT_AMBULATORY_CARE_PROVIDER_SITE_OTHER): Payer: Medicare Other | Admitting: Vascular Surgery

## 2011-10-28 ENCOUNTER — Encounter (INDEPENDENT_AMBULATORY_CARE_PROVIDER_SITE_OTHER): Payer: Medicare Other | Admitting: *Deleted

## 2011-10-28 VITALS — BP 174/55 | HR 87 | Resp 18 | Ht 59.0 in | Wt 106.0 lb

## 2011-10-28 DIAGNOSIS — H16209 Unspecified keratoconjunctivitis, unspecified eye: Secondary | ICD-10-CM | POA: Diagnosis not present

## 2011-10-28 DIAGNOSIS — D631 Anemia in chronic kidney disease: Secondary | ICD-10-CM | POA: Diagnosis not present

## 2011-10-28 DIAGNOSIS — T82898A Other specified complication of vascular prosthetic devices, implants and grafts, initial encounter: Secondary | ICD-10-CM | POA: Diagnosis not present

## 2011-10-28 DIAGNOSIS — N186 End stage renal disease: Secondary | ICD-10-CM

## 2011-10-28 DIAGNOSIS — E878 Other disorders of electrolyte and fluid balance, not elsewhere classified: Secondary | ICD-10-CM | POA: Diagnosis not present

## 2011-10-28 DIAGNOSIS — Z992 Dependence on renal dialysis: Secondary | ICD-10-CM | POA: Diagnosis not present

## 2011-10-28 DIAGNOSIS — E119 Type 2 diabetes mellitus without complications: Secondary | ICD-10-CM | POA: Diagnosis not present

## 2011-10-28 DIAGNOSIS — N039 Chronic nephritic syndrome with unspecified morphologic changes: Secondary | ICD-10-CM | POA: Diagnosis not present

## 2011-10-28 DIAGNOSIS — D509 Iron deficiency anemia, unspecified: Secondary | ICD-10-CM | POA: Diagnosis not present

## 2011-10-28 NOTE — Progress Notes (Signed)
Patient is a 78 year old female referred for evaluation of hematoma of right arm AV fistula. She is referred by Dr. Abel Presto. She states that they're not having difficulty sticking her fistula on dialysis. They are using a buttonhole technique. She states that he may have had infiltration recently. Hematoma has not been expanding.  She has no numbness or tingling in her hand  Review of systems: She denies shortness of breath. She denies chest pain.  Physical exam: Filed Vitals:   10/28/11 1556  BP: 174/55  Pulse: 87  Resp: 18  Height: 4\' 11"  (1.499 m)  Weight: 106 lb (48.081 kg)   Right upper extremity: Palpable thrill in fistula,  3 x 5 cm mass right medial arm adjacent the fistula. The is palpable throughout its course. There is no erythema the fistula is palpable up to the level of the right shoulder  Data: Patient had a duplex of her AV fistula today. This shows the fistula is widely patent. The diameter is greater than 10 mm throughout most of its course. It is 12 mm primarily in the mid section. However in the upper portion the arm is 8-9 mm in diameter. There was a hematoma with no active extravasation at the proximal aspect of the fistula. This measured 2.7 x 3.4 x 5.5  Assessment: Right AV fistula hematoma non-expanding no evidence of infection. Consideration should be given for using a different area of the fistula until this heals. However using a buttonhole technique I will leave this at the discretion of Dr. Abel Presto and Dr. Allena Katz. The patient will followup on as-needed basis.  Plan: See above  Fabienne Bruns, MD [facility] [place] Office: [phone] Pager: [phone]

## 2011-10-28 DEATH — deceased

## 2011-10-30 DIAGNOSIS — E878 Other disorders of electrolyte and fluid balance, not elsewhere classified: Secondary | ICD-10-CM | POA: Diagnosis not present

## 2011-10-30 DIAGNOSIS — D509 Iron deficiency anemia, unspecified: Secondary | ICD-10-CM | POA: Diagnosis not present

## 2011-10-30 DIAGNOSIS — D631 Anemia in chronic kidney disease: Secondary | ICD-10-CM | POA: Diagnosis not present

## 2011-10-30 DIAGNOSIS — N186 End stage renal disease: Secondary | ICD-10-CM | POA: Diagnosis not present

## 2011-10-30 DIAGNOSIS — E119 Type 2 diabetes mellitus without complications: Secondary | ICD-10-CM | POA: Diagnosis not present

## 2011-10-30 DIAGNOSIS — Z992 Dependence on renal dialysis: Secondary | ICD-10-CM | POA: Diagnosis not present

## 2011-11-02 DIAGNOSIS — N039 Chronic nephritic syndrome with unspecified morphologic changes: Secondary | ICD-10-CM | POA: Diagnosis not present

## 2011-11-02 DIAGNOSIS — Z992 Dependence on renal dialysis: Secondary | ICD-10-CM | POA: Diagnosis not present

## 2011-11-02 DIAGNOSIS — E119 Type 2 diabetes mellitus without complications: Secondary | ICD-10-CM | POA: Diagnosis not present

## 2011-11-02 DIAGNOSIS — N186 End stage renal disease: Secondary | ICD-10-CM | POA: Diagnosis not present

## 2011-11-02 DIAGNOSIS — D509 Iron deficiency anemia, unspecified: Secondary | ICD-10-CM | POA: Diagnosis not present

## 2011-11-02 DIAGNOSIS — E878 Other disorders of electrolyte and fluid balance, not elsewhere classified: Secondary | ICD-10-CM | POA: Diagnosis not present

## 2011-11-04 DIAGNOSIS — E119 Type 2 diabetes mellitus without complications: Secondary | ICD-10-CM | POA: Diagnosis not present

## 2011-11-04 DIAGNOSIS — Z992 Dependence on renal dialysis: Secondary | ICD-10-CM | POA: Diagnosis not present

## 2011-11-04 DIAGNOSIS — N039 Chronic nephritic syndrome with unspecified morphologic changes: Secondary | ICD-10-CM | POA: Diagnosis not present

## 2011-11-04 DIAGNOSIS — D509 Iron deficiency anemia, unspecified: Secondary | ICD-10-CM | POA: Diagnosis not present

## 2011-11-04 DIAGNOSIS — E878 Other disorders of electrolyte and fluid balance, not elsewhere classified: Secondary | ICD-10-CM | POA: Diagnosis not present

## 2011-11-04 DIAGNOSIS — N186 End stage renal disease: Secondary | ICD-10-CM | POA: Diagnosis not present

## 2011-11-06 DIAGNOSIS — D631 Anemia in chronic kidney disease: Secondary | ICD-10-CM | POA: Diagnosis not present

## 2011-11-06 DIAGNOSIS — N186 End stage renal disease: Secondary | ICD-10-CM | POA: Diagnosis not present

## 2011-11-06 DIAGNOSIS — D509 Iron deficiency anemia, unspecified: Secondary | ICD-10-CM | POA: Diagnosis not present

## 2011-11-06 DIAGNOSIS — E878 Other disorders of electrolyte and fluid balance, not elsewhere classified: Secondary | ICD-10-CM | POA: Diagnosis not present

## 2011-11-06 DIAGNOSIS — Z992 Dependence on renal dialysis: Secondary | ICD-10-CM | POA: Diagnosis not present

## 2011-11-06 DIAGNOSIS — E119 Type 2 diabetes mellitus without complications: Secondary | ICD-10-CM | POA: Diagnosis not present

## 2011-11-09 DIAGNOSIS — N186 End stage renal disease: Secondary | ICD-10-CM | POA: Diagnosis not present

## 2011-11-09 DIAGNOSIS — Z992 Dependence on renal dialysis: Secondary | ICD-10-CM | POA: Diagnosis not present

## 2011-11-09 DIAGNOSIS — E878 Other disorders of electrolyte and fluid balance, not elsewhere classified: Secondary | ICD-10-CM | POA: Diagnosis not present

## 2011-11-09 DIAGNOSIS — D631 Anemia in chronic kidney disease: Secondary | ICD-10-CM | POA: Diagnosis not present

## 2011-11-09 DIAGNOSIS — E119 Type 2 diabetes mellitus without complications: Secondary | ICD-10-CM | POA: Diagnosis not present

## 2011-11-09 DIAGNOSIS — D509 Iron deficiency anemia, unspecified: Secondary | ICD-10-CM | POA: Diagnosis not present

## 2011-11-11 DIAGNOSIS — Z992 Dependence on renal dialysis: Secondary | ICD-10-CM | POA: Diagnosis not present

## 2011-11-11 DIAGNOSIS — D631 Anemia in chronic kidney disease: Secondary | ICD-10-CM | POA: Diagnosis not present

## 2011-11-11 DIAGNOSIS — E878 Other disorders of electrolyte and fluid balance, not elsewhere classified: Secondary | ICD-10-CM | POA: Diagnosis not present

## 2011-11-11 DIAGNOSIS — N186 End stage renal disease: Secondary | ICD-10-CM | POA: Diagnosis not present

## 2011-11-11 DIAGNOSIS — E119 Type 2 diabetes mellitus without complications: Secondary | ICD-10-CM | POA: Diagnosis not present

## 2011-11-11 DIAGNOSIS — D509 Iron deficiency anemia, unspecified: Secondary | ICD-10-CM | POA: Diagnosis not present

## 2011-11-11 NOTE — Procedures (Unsigned)
VASCULAR LAB EXAM  INDICATION:  Follow up right upper extremity hematoma subsequent to right brachiocephalic fistula revision.  HISTORY: Diabetes:  Yes Cardiac: Hypertension:  Yes  EXAM:  The right brachiocephalic fistula for dialysis access was evaluated using color-flow imaging and pulsed Doppler special analysis. The arterial anastomosis has velocities of greater than 666 cm/second, however, no significant disease is observed.  Approximately 2 cm distal to the arterial anastomosis, there is a slight kink with disease present which may be the previous patch site after revision.  The remainder of the cephalic vein is unremarkable.  There is a nonvascularized complex mass adjacent to the outflow vein in the distal upper arm measuring approximately 2.7 cm x 3.4 cm x 5.5 cm. There is no vascularization observed within the mass.  IMPRESSION: 1. Patent right brachiocephalic fistula with elevated velocities of     the arterial anastomosis and at a slightly tortuous area just     distal to the anastomosis. 2. Nonvascularized complex mass in the distal upper arm, as described     above.  ___________________________________________ [name], MD  LT/MEDQ  D:  10/29/2011  T:  10/29/2011  Job:  [id number]

## 2011-11-13 DIAGNOSIS — D509 Iron deficiency anemia, unspecified: Secondary | ICD-10-CM | POA: Diagnosis not present

## 2011-11-13 DIAGNOSIS — E119 Type 2 diabetes mellitus without complications: Secondary | ICD-10-CM | POA: Diagnosis not present

## 2011-11-13 DIAGNOSIS — Z992 Dependence on renal dialysis: Secondary | ICD-10-CM | POA: Diagnosis not present

## 2011-11-13 DIAGNOSIS — N186 End stage renal disease: Secondary | ICD-10-CM | POA: Diagnosis not present

## 2011-11-13 DIAGNOSIS — E878 Other disorders of electrolyte and fluid balance, not elsewhere classified: Secondary | ICD-10-CM | POA: Diagnosis not present

## 2011-11-13 DIAGNOSIS — D631 Anemia in chronic kidney disease: Secondary | ICD-10-CM | POA: Diagnosis not present

## 2011-11-16 DIAGNOSIS — E878 Other disorders of electrolyte and fluid balance, not elsewhere classified: Secondary | ICD-10-CM | POA: Diagnosis not present

## 2011-11-16 DIAGNOSIS — D509 Iron deficiency anemia, unspecified: Secondary | ICD-10-CM | POA: Diagnosis not present

## 2011-11-16 DIAGNOSIS — Z992 Dependence on renal dialysis: Secondary | ICD-10-CM | POA: Diagnosis not present

## 2011-11-16 DIAGNOSIS — N186 End stage renal disease: Secondary | ICD-10-CM | POA: Diagnosis not present

## 2011-11-16 DIAGNOSIS — D631 Anemia in chronic kidney disease: Secondary | ICD-10-CM | POA: Diagnosis not present

## 2011-11-16 DIAGNOSIS — E119 Type 2 diabetes mellitus without complications: Secondary | ICD-10-CM | POA: Diagnosis not present

## 2011-11-18 DIAGNOSIS — E119 Type 2 diabetes mellitus without complications: Secondary | ICD-10-CM | POA: Diagnosis not present

## 2011-11-18 DIAGNOSIS — N186 End stage renal disease: Secondary | ICD-10-CM | POA: Diagnosis not present

## 2011-11-18 DIAGNOSIS — Z992 Dependence on renal dialysis: Secondary | ICD-10-CM | POA: Diagnosis not present

## 2011-11-18 DIAGNOSIS — E878 Other disorders of electrolyte and fluid balance, not elsewhere classified: Secondary | ICD-10-CM | POA: Diagnosis not present

## 2011-11-18 DIAGNOSIS — D509 Iron deficiency anemia, unspecified: Secondary | ICD-10-CM | POA: Diagnosis not present

## 2011-11-18 DIAGNOSIS — N039 Chronic nephritic syndrome with unspecified morphologic changes: Secondary | ICD-10-CM | POA: Diagnosis not present

## 2011-11-20 DIAGNOSIS — Z992 Dependence on renal dialysis: Secondary | ICD-10-CM | POA: Diagnosis not present

## 2011-11-20 DIAGNOSIS — E119 Type 2 diabetes mellitus without complications: Secondary | ICD-10-CM | POA: Diagnosis not present

## 2011-11-20 DIAGNOSIS — N186 End stage renal disease: Secondary | ICD-10-CM | POA: Diagnosis not present

## 2011-11-20 DIAGNOSIS — E878 Other disorders of electrolyte and fluid balance, not elsewhere classified: Secondary | ICD-10-CM | POA: Diagnosis not present

## 2011-11-20 DIAGNOSIS — D631 Anemia in chronic kidney disease: Secondary | ICD-10-CM | POA: Diagnosis not present

## 2011-11-20 DIAGNOSIS — D509 Iron deficiency anemia, unspecified: Secondary | ICD-10-CM | POA: Diagnosis not present

## 2011-11-23 DIAGNOSIS — E878 Other disorders of electrolyte and fluid balance, not elsewhere classified: Secondary | ICD-10-CM | POA: Diagnosis not present

## 2011-11-23 DIAGNOSIS — Z992 Dependence on renal dialysis: Secondary | ICD-10-CM | POA: Diagnosis not present

## 2011-11-23 DIAGNOSIS — N039 Chronic nephritic syndrome with unspecified morphologic changes: Secondary | ICD-10-CM | POA: Diagnosis not present

## 2011-11-23 DIAGNOSIS — E119 Type 2 diabetes mellitus without complications: Secondary | ICD-10-CM | POA: Diagnosis not present

## 2011-11-23 DIAGNOSIS — D509 Iron deficiency anemia, unspecified: Secondary | ICD-10-CM | POA: Diagnosis not present

## 2011-11-23 DIAGNOSIS — N186 End stage renal disease: Secondary | ICD-10-CM | POA: Diagnosis not present

## 2011-11-25 DIAGNOSIS — N186 End stage renal disease: Secondary | ICD-10-CM | POA: Diagnosis not present

## 2011-11-25 DIAGNOSIS — D631 Anemia in chronic kidney disease: Secondary | ICD-10-CM | POA: Diagnosis not present

## 2011-11-25 DIAGNOSIS — Z992 Dependence on renal dialysis: Secondary | ICD-10-CM | POA: Diagnosis not present

## 2011-11-25 DIAGNOSIS — E878 Other disorders of electrolyte and fluid balance, not elsewhere classified: Secondary | ICD-10-CM | POA: Diagnosis not present

## 2011-11-25 DIAGNOSIS — D509 Iron deficiency anemia, unspecified: Secondary | ICD-10-CM | POA: Diagnosis not present

## 2011-11-25 DIAGNOSIS — E119 Type 2 diabetes mellitus without complications: Secondary | ICD-10-CM | POA: Diagnosis not present

## 2011-11-27 DIAGNOSIS — E119 Type 2 diabetes mellitus without complications: Secondary | ICD-10-CM | POA: Diagnosis not present

## 2011-11-27 DIAGNOSIS — N186 End stage renal disease: Secondary | ICD-10-CM | POA: Diagnosis not present

## 2011-11-29 DIAGNOSIS — S2239XA Fracture of one rib, unspecified side, initial encounter for closed fracture: Secondary | ICD-10-CM | POA: Diagnosis not present

## 2011-11-29 DIAGNOSIS — S298XXA Other specified injuries of thorax, initial encounter: Secondary | ICD-10-CM | POA: Diagnosis not present

## 2011-11-30 DIAGNOSIS — N186 End stage renal disease: Secondary | ICD-10-CM | POA: Diagnosis not present

## 2011-12-02 DIAGNOSIS — N186 End stage renal disease: Secondary | ICD-10-CM | POA: Diagnosis not present

## 2011-12-02 DIAGNOSIS — D509 Iron deficiency anemia, unspecified: Secondary | ICD-10-CM | POA: Diagnosis not present

## 2011-12-04 DIAGNOSIS — N186 End stage renal disease: Secondary | ICD-10-CM | POA: Diagnosis not present

## 2011-12-04 DIAGNOSIS — D509 Iron deficiency anemia, unspecified: Secondary | ICD-10-CM | POA: Diagnosis not present

## 2011-12-07 DIAGNOSIS — E785 Hyperlipidemia, unspecified: Secondary | ICD-10-CM | POA: Diagnosis not present

## 2011-12-07 DIAGNOSIS — D631 Anemia in chronic kidney disease: Secondary | ICD-10-CM | POA: Diagnosis not present

## 2011-12-07 DIAGNOSIS — N186 End stage renal disease: Secondary | ICD-10-CM | POA: Diagnosis not present

## 2011-12-07 DIAGNOSIS — Z23 Encounter for immunization: Secondary | ICD-10-CM | POA: Diagnosis not present

## 2011-12-07 DIAGNOSIS — N2581 Secondary hyperparathyroidism of renal origin: Secondary | ICD-10-CM | POA: Diagnosis not present

## 2011-12-07 DIAGNOSIS — D509 Iron deficiency anemia, unspecified: Secondary | ICD-10-CM | POA: Diagnosis not present

## 2011-12-07 DIAGNOSIS — E119 Type 2 diabetes mellitus without complications: Secondary | ICD-10-CM | POA: Diagnosis not present

## 2011-12-07 DIAGNOSIS — E878 Other disorders of electrolyte and fluid balance, not elsewhere classified: Secondary | ICD-10-CM | POA: Diagnosis not present

## 2011-12-08 DIAGNOSIS — Z853 Personal history of malignant neoplasm of breast: Secondary | ICD-10-CM | POA: Diagnosis not present

## 2011-12-08 DIAGNOSIS — Z13820 Encounter for screening for osteoporosis: Secondary | ICD-10-CM | POA: Diagnosis not present

## 2011-12-09 DIAGNOSIS — E119 Type 2 diabetes mellitus without complications: Secondary | ICD-10-CM | POA: Diagnosis not present

## 2011-12-09 DIAGNOSIS — N186 End stage renal disease: Secondary | ICD-10-CM | POA: Diagnosis not present

## 2011-12-09 DIAGNOSIS — D509 Iron deficiency anemia, unspecified: Secondary | ICD-10-CM | POA: Diagnosis not present

## 2011-12-09 DIAGNOSIS — E785 Hyperlipidemia, unspecified: Secondary | ICD-10-CM | POA: Diagnosis not present

## 2011-12-09 DIAGNOSIS — D631 Anemia in chronic kidney disease: Secondary | ICD-10-CM | POA: Diagnosis not present

## 2011-12-11 DIAGNOSIS — E785 Hyperlipidemia, unspecified: Secondary | ICD-10-CM | POA: Diagnosis not present

## 2011-12-11 DIAGNOSIS — D509 Iron deficiency anemia, unspecified: Secondary | ICD-10-CM | POA: Diagnosis not present

## 2011-12-11 DIAGNOSIS — E119 Type 2 diabetes mellitus without complications: Secondary | ICD-10-CM | POA: Diagnosis not present

## 2011-12-11 DIAGNOSIS — N186 End stage renal disease: Secondary | ICD-10-CM | POA: Diagnosis not present

## 2011-12-11 DIAGNOSIS — D631 Anemia in chronic kidney disease: Secondary | ICD-10-CM | POA: Diagnosis not present

## 2011-12-14 DIAGNOSIS — E785 Hyperlipidemia, unspecified: Secondary | ICD-10-CM | POA: Diagnosis not present

## 2011-12-14 DIAGNOSIS — N039 Chronic nephritic syndrome with unspecified morphologic changes: Secondary | ICD-10-CM | POA: Diagnosis not present

## 2011-12-14 DIAGNOSIS — E119 Type 2 diabetes mellitus without complications: Secondary | ICD-10-CM | POA: Diagnosis not present

## 2011-12-14 DIAGNOSIS — N186 End stage renal disease: Secondary | ICD-10-CM | POA: Diagnosis not present

## 2011-12-14 DIAGNOSIS — D509 Iron deficiency anemia, unspecified: Secondary | ICD-10-CM | POA: Diagnosis not present

## 2011-12-16 DIAGNOSIS — E119 Type 2 diabetes mellitus without complications: Secondary | ICD-10-CM | POA: Diagnosis not present

## 2011-12-16 DIAGNOSIS — E785 Hyperlipidemia, unspecified: Secondary | ICD-10-CM | POA: Diagnosis not present

## 2011-12-16 DIAGNOSIS — E1129 Type 2 diabetes mellitus with other diabetic kidney complication: Secondary | ICD-10-CM | POA: Diagnosis not present

## 2011-12-16 DIAGNOSIS — D631 Anemia in chronic kidney disease: Secondary | ICD-10-CM | POA: Diagnosis not present

## 2011-12-16 DIAGNOSIS — D509 Iron deficiency anemia, unspecified: Secondary | ICD-10-CM | POA: Diagnosis not present

## 2011-12-16 DIAGNOSIS — N186 End stage renal disease: Secondary | ICD-10-CM | POA: Diagnosis not present

## 2011-12-18 DIAGNOSIS — D631 Anemia in chronic kidney disease: Secondary | ICD-10-CM | POA: Diagnosis not present

## 2011-12-18 DIAGNOSIS — N186 End stage renal disease: Secondary | ICD-10-CM | POA: Diagnosis not present

## 2011-12-18 DIAGNOSIS — E119 Type 2 diabetes mellitus without complications: Secondary | ICD-10-CM | POA: Diagnosis not present

## 2011-12-18 DIAGNOSIS — D509 Iron deficiency anemia, unspecified: Secondary | ICD-10-CM | POA: Diagnosis not present

## 2011-12-18 DIAGNOSIS — E785 Hyperlipidemia, unspecified: Secondary | ICD-10-CM | POA: Diagnosis not present

## 2011-12-21 DIAGNOSIS — N186 End stage renal disease: Secondary | ICD-10-CM | POA: Diagnosis not present

## 2011-12-21 DIAGNOSIS — N039 Chronic nephritic syndrome with unspecified morphologic changes: Secondary | ICD-10-CM | POA: Diagnosis not present

## 2011-12-21 DIAGNOSIS — E119 Type 2 diabetes mellitus without complications: Secondary | ICD-10-CM | POA: Diagnosis not present

## 2011-12-21 DIAGNOSIS — E785 Hyperlipidemia, unspecified: Secondary | ICD-10-CM | POA: Diagnosis not present

## 2011-12-21 DIAGNOSIS — D509 Iron deficiency anemia, unspecified: Secondary | ICD-10-CM | POA: Diagnosis not present

## 2011-12-23 DIAGNOSIS — E119 Type 2 diabetes mellitus without complications: Secondary | ICD-10-CM | POA: Diagnosis not present

## 2011-12-23 DIAGNOSIS — D631 Anemia in chronic kidney disease: Secondary | ICD-10-CM | POA: Diagnosis not present

## 2011-12-23 DIAGNOSIS — D509 Iron deficiency anemia, unspecified: Secondary | ICD-10-CM | POA: Diagnosis not present

## 2011-12-23 DIAGNOSIS — N186 End stage renal disease: Secondary | ICD-10-CM | POA: Diagnosis not present

## 2011-12-23 DIAGNOSIS — E785 Hyperlipidemia, unspecified: Secondary | ICD-10-CM | POA: Diagnosis not present

## 2011-12-25 DIAGNOSIS — D509 Iron deficiency anemia, unspecified: Secondary | ICD-10-CM | POA: Diagnosis not present

## 2011-12-25 DIAGNOSIS — E119 Type 2 diabetes mellitus without complications: Secondary | ICD-10-CM | POA: Diagnosis not present

## 2011-12-25 DIAGNOSIS — N039 Chronic nephritic syndrome with unspecified morphologic changes: Secondary | ICD-10-CM | POA: Diagnosis not present

## 2011-12-25 DIAGNOSIS — E785 Hyperlipidemia, unspecified: Secondary | ICD-10-CM | POA: Diagnosis not present

## 2011-12-25 DIAGNOSIS — N186 End stage renal disease: Secondary | ICD-10-CM | POA: Diagnosis not present

## 2011-12-27 DIAGNOSIS — N186 End stage renal disease: Secondary | ICD-10-CM | POA: Diagnosis not present

## 2011-12-28 DIAGNOSIS — D631 Anemia in chronic kidney disease: Secondary | ICD-10-CM | POA: Diagnosis not present

## 2011-12-28 DIAGNOSIS — Z992 Dependence on renal dialysis: Secondary | ICD-10-CM | POA: Diagnosis not present

## 2011-12-28 DIAGNOSIS — N186 End stage renal disease: Secondary | ICD-10-CM | POA: Diagnosis not present

## 2011-12-28 DIAGNOSIS — E878 Other disorders of electrolyte and fluid balance, not elsewhere classified: Secondary | ICD-10-CM | POA: Diagnosis not present

## 2011-12-28 DIAGNOSIS — D509 Iron deficiency anemia, unspecified: Secondary | ICD-10-CM | POA: Diagnosis not present

## 2011-12-28 DIAGNOSIS — E119 Type 2 diabetes mellitus without complications: Secondary | ICD-10-CM | POA: Diagnosis not present

## 2011-12-30 DIAGNOSIS — D509 Iron deficiency anemia, unspecified: Secondary | ICD-10-CM | POA: Diagnosis not present

## 2011-12-30 DIAGNOSIS — E119 Type 2 diabetes mellitus without complications: Secondary | ICD-10-CM | POA: Diagnosis not present

## 2011-12-30 DIAGNOSIS — D631 Anemia in chronic kidney disease: Secondary | ICD-10-CM | POA: Diagnosis not present

## 2011-12-30 DIAGNOSIS — Z992 Dependence on renal dialysis: Secondary | ICD-10-CM | POA: Diagnosis not present

## 2011-12-30 DIAGNOSIS — E878 Other disorders of electrolyte and fluid balance, not elsewhere classified: Secondary | ICD-10-CM | POA: Diagnosis not present

## 2011-12-30 DIAGNOSIS — N186 End stage renal disease: Secondary | ICD-10-CM | POA: Diagnosis not present

## 2011-12-31 DIAGNOSIS — S2239XA Fracture of one rib, unspecified side, initial encounter for closed fracture: Secondary | ICD-10-CM | POA: Diagnosis not present

## 2011-12-31 DIAGNOSIS — M549 Dorsalgia, unspecified: Secondary | ICD-10-CM | POA: Diagnosis not present

## 2011-12-31 DIAGNOSIS — M546 Pain in thoracic spine: Secondary | ICD-10-CM | POA: Diagnosis not present

## 2011-12-31 DIAGNOSIS — M629 Disorder of muscle, unspecified: Secondary | ICD-10-CM | POA: Diagnosis not present

## 2012-01-01 DIAGNOSIS — D509 Iron deficiency anemia, unspecified: Secondary | ICD-10-CM | POA: Diagnosis not present

## 2012-01-01 DIAGNOSIS — D631 Anemia in chronic kidney disease: Secondary | ICD-10-CM | POA: Diagnosis not present

## 2012-01-01 DIAGNOSIS — E878 Other disorders of electrolyte and fluid balance, not elsewhere classified: Secondary | ICD-10-CM | POA: Diagnosis not present

## 2012-01-01 DIAGNOSIS — E119 Type 2 diabetes mellitus without complications: Secondary | ICD-10-CM | POA: Diagnosis not present

## 2012-01-01 DIAGNOSIS — Z992 Dependence on renal dialysis: Secondary | ICD-10-CM | POA: Diagnosis not present

## 2012-01-01 DIAGNOSIS — N186 End stage renal disease: Secondary | ICD-10-CM | POA: Diagnosis not present

## 2012-01-03 DIAGNOSIS — H169 Unspecified keratitis: Secondary | ICD-10-CM | POA: Diagnosis not present

## 2012-01-04 DIAGNOSIS — N039 Chronic nephritic syndrome with unspecified morphologic changes: Secondary | ICD-10-CM | POA: Diagnosis not present

## 2012-01-04 DIAGNOSIS — Z992 Dependence on renal dialysis: Secondary | ICD-10-CM | POA: Diagnosis not present

## 2012-01-04 DIAGNOSIS — H169 Unspecified keratitis: Secondary | ICD-10-CM | POA: Diagnosis not present

## 2012-01-04 DIAGNOSIS — E878 Other disorders of electrolyte and fluid balance, not elsewhere classified: Secondary | ICD-10-CM | POA: Diagnosis not present

## 2012-01-04 DIAGNOSIS — D509 Iron deficiency anemia, unspecified: Secondary | ICD-10-CM | POA: Diagnosis not present

## 2012-01-04 DIAGNOSIS — N186 End stage renal disease: Secondary | ICD-10-CM | POA: Diagnosis not present

## 2012-01-04 DIAGNOSIS — E119 Type 2 diabetes mellitus without complications: Secondary | ICD-10-CM | POA: Diagnosis not present

## 2012-01-06 DIAGNOSIS — E119 Type 2 diabetes mellitus without complications: Secondary | ICD-10-CM | POA: Diagnosis not present

## 2012-01-06 DIAGNOSIS — Z992 Dependence on renal dialysis: Secondary | ICD-10-CM | POA: Diagnosis not present

## 2012-01-06 DIAGNOSIS — N186 End stage renal disease: Secondary | ICD-10-CM | POA: Diagnosis not present

## 2012-01-06 DIAGNOSIS — D509 Iron deficiency anemia, unspecified: Secondary | ICD-10-CM | POA: Diagnosis not present

## 2012-01-06 DIAGNOSIS — E878 Other disorders of electrolyte and fluid balance, not elsewhere classified: Secondary | ICD-10-CM | POA: Diagnosis not present

## 2012-01-06 DIAGNOSIS — N039 Chronic nephritic syndrome with unspecified morphologic changes: Secondary | ICD-10-CM | POA: Diagnosis not present

## 2012-01-07 DIAGNOSIS — M546 Pain in thoracic spine: Secondary | ICD-10-CM | POA: Diagnosis not present

## 2012-01-07 DIAGNOSIS — T148XXA Other injury of unspecified body region, initial encounter: Secondary | ICD-10-CM | POA: Diagnosis not present

## 2012-01-07 DIAGNOSIS — M4000 Postural kyphosis, site unspecified: Secondary | ICD-10-CM | POA: Diagnosis not present

## 2012-01-08 DIAGNOSIS — N186 End stage renal disease: Secondary | ICD-10-CM | POA: Diagnosis not present

## 2012-01-08 DIAGNOSIS — Z992 Dependence on renal dialysis: Secondary | ICD-10-CM | POA: Diagnosis not present

## 2012-01-08 DIAGNOSIS — D509 Iron deficiency anemia, unspecified: Secondary | ICD-10-CM | POA: Diagnosis not present

## 2012-01-08 DIAGNOSIS — E119 Type 2 diabetes mellitus without complications: Secondary | ICD-10-CM | POA: Diagnosis not present

## 2012-01-08 DIAGNOSIS — E878 Other disorders of electrolyte and fluid balance, not elsewhere classified: Secondary | ICD-10-CM | POA: Diagnosis not present

## 2012-01-08 DIAGNOSIS — D631 Anemia in chronic kidney disease: Secondary | ICD-10-CM | POA: Diagnosis not present

## 2012-01-11 DIAGNOSIS — E119 Type 2 diabetes mellitus without complications: Secondary | ICD-10-CM | POA: Diagnosis not present

## 2012-01-11 DIAGNOSIS — D631 Anemia in chronic kidney disease: Secondary | ICD-10-CM | POA: Diagnosis not present

## 2012-01-11 DIAGNOSIS — Z992 Dependence on renal dialysis: Secondary | ICD-10-CM | POA: Diagnosis not present

## 2012-01-11 DIAGNOSIS — D509 Iron deficiency anemia, unspecified: Secondary | ICD-10-CM | POA: Diagnosis not present

## 2012-01-11 DIAGNOSIS — E878 Other disorders of electrolyte and fluid balance, not elsewhere classified: Secondary | ICD-10-CM | POA: Diagnosis not present

## 2012-01-11 DIAGNOSIS — N186 End stage renal disease: Secondary | ICD-10-CM | POA: Diagnosis not present

## 2012-01-14 DIAGNOSIS — E878 Other disorders of electrolyte and fluid balance, not elsewhere classified: Secondary | ICD-10-CM | POA: Diagnosis not present

## 2012-01-14 DIAGNOSIS — Z992 Dependence on renal dialysis: Secondary | ICD-10-CM | POA: Diagnosis not present

## 2012-01-14 DIAGNOSIS — D631 Anemia in chronic kidney disease: Secondary | ICD-10-CM | POA: Diagnosis not present

## 2012-01-14 DIAGNOSIS — E119 Type 2 diabetes mellitus without complications: Secondary | ICD-10-CM | POA: Diagnosis not present

## 2012-01-14 DIAGNOSIS — D509 Iron deficiency anemia, unspecified: Secondary | ICD-10-CM | POA: Diagnosis not present

## 2012-01-14 DIAGNOSIS — M1990 Unspecified osteoarthritis, unspecified site: Secondary | ICD-10-CM | POA: Diagnosis not present

## 2012-01-14 DIAGNOSIS — M549 Dorsalgia, unspecified: Secondary | ICD-10-CM | POA: Diagnosis not present

## 2012-01-14 DIAGNOSIS — N186 End stage renal disease: Secondary | ICD-10-CM | POA: Diagnosis not present

## 2012-01-15 DIAGNOSIS — E119 Type 2 diabetes mellitus without complications: Secondary | ICD-10-CM | POA: Diagnosis not present

## 2012-01-15 DIAGNOSIS — D631 Anemia in chronic kidney disease: Secondary | ICD-10-CM | POA: Diagnosis not present

## 2012-01-15 DIAGNOSIS — Z992 Dependence on renal dialysis: Secondary | ICD-10-CM | POA: Diagnosis not present

## 2012-01-15 DIAGNOSIS — D509 Iron deficiency anemia, unspecified: Secondary | ICD-10-CM | POA: Diagnosis not present

## 2012-01-15 DIAGNOSIS — N186 End stage renal disease: Secondary | ICD-10-CM | POA: Diagnosis not present

## 2012-01-15 DIAGNOSIS — E878 Other disorders of electrolyte and fluid balance, not elsewhere classified: Secondary | ICD-10-CM | POA: Diagnosis not present

## 2012-01-17 DIAGNOSIS — D509 Iron deficiency anemia, unspecified: Secondary | ICD-10-CM | POA: Diagnosis not present

## 2012-01-17 DIAGNOSIS — N186 End stage renal disease: Secondary | ICD-10-CM | POA: Diagnosis not present

## 2012-01-17 DIAGNOSIS — E878 Other disorders of electrolyte and fluid balance, not elsewhere classified: Secondary | ICD-10-CM | POA: Diagnosis not present

## 2012-01-17 DIAGNOSIS — D631 Anemia in chronic kidney disease: Secondary | ICD-10-CM | POA: Diagnosis not present

## 2012-01-17 DIAGNOSIS — Z992 Dependence on renal dialysis: Secondary | ICD-10-CM | POA: Diagnosis not present

## 2012-01-17 DIAGNOSIS — E119 Type 2 diabetes mellitus without complications: Secondary | ICD-10-CM | POA: Diagnosis not present

## 2012-01-19 DIAGNOSIS — D631 Anemia in chronic kidney disease: Secondary | ICD-10-CM | POA: Diagnosis not present

## 2012-01-19 DIAGNOSIS — E878 Other disorders of electrolyte and fluid balance, not elsewhere classified: Secondary | ICD-10-CM | POA: Diagnosis not present

## 2012-01-19 DIAGNOSIS — N186 End stage renal disease: Secondary | ICD-10-CM | POA: Diagnosis not present

## 2012-01-19 DIAGNOSIS — E119 Type 2 diabetes mellitus without complications: Secondary | ICD-10-CM | POA: Diagnosis not present

## 2012-01-19 DIAGNOSIS — Z992 Dependence on renal dialysis: Secondary | ICD-10-CM | POA: Diagnosis not present

## 2012-01-19 DIAGNOSIS — D509 Iron deficiency anemia, unspecified: Secondary | ICD-10-CM | POA: Diagnosis not present

## 2012-01-21 DIAGNOSIS — D509 Iron deficiency anemia, unspecified: Secondary | ICD-10-CM | POA: Diagnosis not present

## 2012-01-21 DIAGNOSIS — N186 End stage renal disease: Secondary | ICD-10-CM | POA: Diagnosis not present

## 2012-01-21 DIAGNOSIS — E878 Other disorders of electrolyte and fluid balance, not elsewhere classified: Secondary | ICD-10-CM | POA: Diagnosis not present

## 2012-01-21 DIAGNOSIS — Z992 Dependence on renal dialysis: Secondary | ICD-10-CM | POA: Diagnosis not present

## 2012-01-21 DIAGNOSIS — D631 Anemia in chronic kidney disease: Secondary | ICD-10-CM | POA: Diagnosis not present

## 2012-01-21 DIAGNOSIS — E119 Type 2 diabetes mellitus without complications: Secondary | ICD-10-CM | POA: Diagnosis not present

## 2012-01-25 DIAGNOSIS — D509 Iron deficiency anemia, unspecified: Secondary | ICD-10-CM | POA: Diagnosis not present

## 2012-01-25 DIAGNOSIS — N186 End stage renal disease: Secondary | ICD-10-CM | POA: Diagnosis not present

## 2012-01-27 DIAGNOSIS — D509 Iron deficiency anemia, unspecified: Secondary | ICD-10-CM | POA: Diagnosis not present

## 2012-01-27 DIAGNOSIS — N186 End stage renal disease: Secondary | ICD-10-CM | POA: Diagnosis not present

## 2012-01-28 DIAGNOSIS — N186 End stage renal disease: Secondary | ICD-10-CM | POA: Diagnosis not present

## 2012-01-28 DIAGNOSIS — D631 Anemia in chronic kidney disease: Secondary | ICD-10-CM | POA: Diagnosis not present

## 2012-01-28 DIAGNOSIS — N039 Chronic nephritic syndrome with unspecified morphologic changes: Secondary | ICD-10-CM | POA: Diagnosis not present

## 2012-02-01 DIAGNOSIS — E119 Type 2 diabetes mellitus without complications: Secondary | ICD-10-CM | POA: Diagnosis not present

## 2012-02-01 DIAGNOSIS — N186 End stage renal disease: Secondary | ICD-10-CM | POA: Diagnosis not present

## 2012-02-01 DIAGNOSIS — N039 Chronic nephritic syndrome with unspecified morphologic changes: Secondary | ICD-10-CM | POA: Diagnosis not present

## 2012-02-01 DIAGNOSIS — E878 Other disorders of electrolyte and fluid balance, not elsewhere classified: Secondary | ICD-10-CM | POA: Diagnosis not present

## 2012-02-01 DIAGNOSIS — D509 Iron deficiency anemia, unspecified: Secondary | ICD-10-CM | POA: Diagnosis not present

## 2012-02-01 DIAGNOSIS — D631 Anemia in chronic kidney disease: Secondary | ICD-10-CM | POA: Diagnosis not present

## 2012-02-03 DIAGNOSIS — E119 Type 2 diabetes mellitus without complications: Secondary | ICD-10-CM | POA: Diagnosis not present

## 2012-02-03 DIAGNOSIS — E878 Other disorders of electrolyte and fluid balance, not elsewhere classified: Secondary | ICD-10-CM | POA: Diagnosis not present

## 2012-02-03 DIAGNOSIS — D631 Anemia in chronic kidney disease: Secondary | ICD-10-CM | POA: Diagnosis not present

## 2012-02-03 DIAGNOSIS — D509 Iron deficiency anemia, unspecified: Secondary | ICD-10-CM | POA: Diagnosis not present

## 2012-02-03 DIAGNOSIS — N186 End stage renal disease: Secondary | ICD-10-CM | POA: Diagnosis not present

## 2012-02-05 DIAGNOSIS — E119 Type 2 diabetes mellitus without complications: Secondary | ICD-10-CM | POA: Diagnosis not present

## 2012-02-05 DIAGNOSIS — D509 Iron deficiency anemia, unspecified: Secondary | ICD-10-CM | POA: Diagnosis not present

## 2012-02-05 DIAGNOSIS — E878 Other disorders of electrolyte and fluid balance, not elsewhere classified: Secondary | ICD-10-CM | POA: Diagnosis not present

## 2012-02-05 DIAGNOSIS — N186 End stage renal disease: Secondary | ICD-10-CM | POA: Diagnosis not present

## 2012-02-05 DIAGNOSIS — N039 Chronic nephritic syndrome with unspecified morphologic changes: Secondary | ICD-10-CM | POA: Diagnosis not present

## 2012-02-07 DIAGNOSIS — S058X9A Other injuries of unspecified eye and orbit, initial encounter: Secondary | ICD-10-CM | POA: Diagnosis not present

## 2012-02-08 DIAGNOSIS — N186 End stage renal disease: Secondary | ICD-10-CM | POA: Diagnosis not present

## 2012-02-08 DIAGNOSIS — E878 Other disorders of electrolyte and fluid balance, not elsewhere classified: Secondary | ICD-10-CM | POA: Diagnosis not present

## 2012-02-08 DIAGNOSIS — D631 Anemia in chronic kidney disease: Secondary | ICD-10-CM | POA: Diagnosis not present

## 2012-02-08 DIAGNOSIS — E119 Type 2 diabetes mellitus without complications: Secondary | ICD-10-CM | POA: Diagnosis not present

## 2012-02-08 DIAGNOSIS — D509 Iron deficiency anemia, unspecified: Secondary | ICD-10-CM | POA: Diagnosis not present

## 2012-02-09 DIAGNOSIS — H10509 Unspecified blepharoconjunctivitis, unspecified eye: Secondary | ICD-10-CM | POA: Diagnosis not present

## 2012-02-10 DIAGNOSIS — E119 Type 2 diabetes mellitus without complications: Secondary | ICD-10-CM | POA: Diagnosis not present

## 2012-02-10 DIAGNOSIS — D509 Iron deficiency anemia, unspecified: Secondary | ICD-10-CM | POA: Diagnosis not present

## 2012-02-10 DIAGNOSIS — E878 Other disorders of electrolyte and fluid balance, not elsewhere classified: Secondary | ICD-10-CM | POA: Diagnosis not present

## 2012-02-10 DIAGNOSIS — D631 Anemia in chronic kidney disease: Secondary | ICD-10-CM | POA: Diagnosis not present

## 2012-02-10 DIAGNOSIS — N186 End stage renal disease: Secondary | ICD-10-CM | POA: Diagnosis not present

## 2012-02-12 DIAGNOSIS — E878 Other disorders of electrolyte and fluid balance, not elsewhere classified: Secondary | ICD-10-CM | POA: Diagnosis not present

## 2012-02-12 DIAGNOSIS — D509 Iron deficiency anemia, unspecified: Secondary | ICD-10-CM | POA: Diagnosis not present

## 2012-02-12 DIAGNOSIS — D631 Anemia in chronic kidney disease: Secondary | ICD-10-CM | POA: Diagnosis not present

## 2012-02-12 DIAGNOSIS — N186 End stage renal disease: Secondary | ICD-10-CM | POA: Diagnosis not present

## 2012-02-12 DIAGNOSIS — E119 Type 2 diabetes mellitus without complications: Secondary | ICD-10-CM | POA: Diagnosis not present

## 2012-02-15 DIAGNOSIS — E878 Other disorders of electrolyte and fluid balance, not elsewhere classified: Secondary | ICD-10-CM | POA: Diagnosis not present

## 2012-02-15 DIAGNOSIS — D509 Iron deficiency anemia, unspecified: Secondary | ICD-10-CM | POA: Diagnosis not present

## 2012-02-15 DIAGNOSIS — E119 Type 2 diabetes mellitus without complications: Secondary | ICD-10-CM | POA: Diagnosis not present

## 2012-02-15 DIAGNOSIS — D631 Anemia in chronic kidney disease: Secondary | ICD-10-CM | POA: Diagnosis not present

## 2012-02-15 DIAGNOSIS — N186 End stage renal disease: Secondary | ICD-10-CM | POA: Diagnosis not present

## 2012-02-17 DIAGNOSIS — D509 Iron deficiency anemia, unspecified: Secondary | ICD-10-CM | POA: Diagnosis not present

## 2012-02-17 DIAGNOSIS — E878 Other disorders of electrolyte and fluid balance, not elsewhere classified: Secondary | ICD-10-CM | POA: Diagnosis not present

## 2012-02-17 DIAGNOSIS — N186 End stage renal disease: Secondary | ICD-10-CM | POA: Diagnosis not present

## 2012-02-17 DIAGNOSIS — E119 Type 2 diabetes mellitus without complications: Secondary | ICD-10-CM | POA: Diagnosis not present

## 2012-02-17 DIAGNOSIS — D631 Anemia in chronic kidney disease: Secondary | ICD-10-CM | POA: Diagnosis not present

## 2012-02-19 DIAGNOSIS — D631 Anemia in chronic kidney disease: Secondary | ICD-10-CM | POA: Diagnosis not present

## 2012-02-19 DIAGNOSIS — E878 Other disorders of electrolyte and fluid balance, not elsewhere classified: Secondary | ICD-10-CM | POA: Diagnosis not present

## 2012-02-19 DIAGNOSIS — D509 Iron deficiency anemia, unspecified: Secondary | ICD-10-CM | POA: Diagnosis not present

## 2012-02-19 DIAGNOSIS — N186 End stage renal disease: Secondary | ICD-10-CM | POA: Diagnosis not present

## 2012-02-19 DIAGNOSIS — E119 Type 2 diabetes mellitus without complications: Secondary | ICD-10-CM | POA: Diagnosis not present

## 2012-02-22 DIAGNOSIS — E878 Other disorders of electrolyte and fluid balance, not elsewhere classified: Secondary | ICD-10-CM | POA: Diagnosis not present

## 2012-02-22 DIAGNOSIS — D631 Anemia in chronic kidney disease: Secondary | ICD-10-CM | POA: Diagnosis not present

## 2012-02-22 DIAGNOSIS — D509 Iron deficiency anemia, unspecified: Secondary | ICD-10-CM | POA: Diagnosis not present

## 2012-02-22 DIAGNOSIS — E119 Type 2 diabetes mellitus without complications: Secondary | ICD-10-CM | POA: Diagnosis not present

## 2012-02-22 DIAGNOSIS — N186 End stage renal disease: Secondary | ICD-10-CM | POA: Diagnosis not present

## 2012-02-24 DIAGNOSIS — E119 Type 2 diabetes mellitus without complications: Secondary | ICD-10-CM | POA: Diagnosis not present

## 2012-02-24 DIAGNOSIS — D509 Iron deficiency anemia, unspecified: Secondary | ICD-10-CM | POA: Diagnosis not present

## 2012-02-24 DIAGNOSIS — N186 End stage renal disease: Secondary | ICD-10-CM | POA: Diagnosis not present

## 2012-02-24 DIAGNOSIS — E878 Other disorders of electrolyte and fluid balance, not elsewhere classified: Secondary | ICD-10-CM | POA: Diagnosis not present

## 2012-02-24 DIAGNOSIS — D631 Anemia in chronic kidney disease: Secondary | ICD-10-CM | POA: Diagnosis not present

## 2012-02-26 DIAGNOSIS — D509 Iron deficiency anemia, unspecified: Secondary | ICD-10-CM | POA: Diagnosis not present

## 2012-02-26 DIAGNOSIS — E119 Type 2 diabetes mellitus without complications: Secondary | ICD-10-CM | POA: Diagnosis not present

## 2012-02-26 DIAGNOSIS — N186 End stage renal disease: Secondary | ICD-10-CM | POA: Diagnosis not present

## 2012-02-26 DIAGNOSIS — E878 Other disorders of electrolyte and fluid balance, not elsewhere classified: Secondary | ICD-10-CM | POA: Diagnosis not present

## 2012-02-26 DIAGNOSIS — D631 Anemia in chronic kidney disease: Secondary | ICD-10-CM | POA: Diagnosis not present

## 2012-02-29 DIAGNOSIS — E119 Type 2 diabetes mellitus without complications: Secondary | ICD-10-CM | POA: Diagnosis not present

## 2012-02-29 DIAGNOSIS — N186 End stage renal disease: Secondary | ICD-10-CM | POA: Diagnosis not present

## 2012-02-29 DIAGNOSIS — D631 Anemia in chronic kidney disease: Secondary | ICD-10-CM | POA: Diagnosis not present

## 2012-02-29 DIAGNOSIS — E785 Hyperlipidemia, unspecified: Secondary | ICD-10-CM | POA: Diagnosis not present

## 2012-02-29 DIAGNOSIS — E878 Other disorders of electrolyte and fluid balance, not elsewhere classified: Secondary | ICD-10-CM | POA: Diagnosis not present

## 2012-02-29 DIAGNOSIS — N039 Chronic nephritic syndrome with unspecified morphologic changes: Secondary | ICD-10-CM | POA: Diagnosis not present

## 2012-02-29 DIAGNOSIS — D509 Iron deficiency anemia, unspecified: Secondary | ICD-10-CM | POA: Diagnosis not present

## 2012-02-29 DIAGNOSIS — N2581 Secondary hyperparathyroidism of renal origin: Secondary | ICD-10-CM | POA: Diagnosis not present

## 2012-03-02 DIAGNOSIS — N039 Chronic nephritic syndrome with unspecified morphologic changes: Secondary | ICD-10-CM | POA: Diagnosis not present

## 2012-03-02 DIAGNOSIS — E119 Type 2 diabetes mellitus without complications: Secondary | ICD-10-CM | POA: Diagnosis not present

## 2012-03-02 DIAGNOSIS — N186 End stage renal disease: Secondary | ICD-10-CM | POA: Diagnosis not present

## 2012-03-02 DIAGNOSIS — E878 Other disorders of electrolyte and fluid balance, not elsewhere classified: Secondary | ICD-10-CM | POA: Diagnosis not present

## 2012-03-02 DIAGNOSIS — E785 Hyperlipidemia, unspecified: Secondary | ICD-10-CM | POA: Diagnosis not present

## 2012-03-02 DIAGNOSIS — D509 Iron deficiency anemia, unspecified: Secondary | ICD-10-CM | POA: Diagnosis not present

## 2012-03-04 DIAGNOSIS — N039 Chronic nephritic syndrome with unspecified morphologic changes: Secondary | ICD-10-CM | POA: Diagnosis not present

## 2012-03-04 DIAGNOSIS — E785 Hyperlipidemia, unspecified: Secondary | ICD-10-CM | POA: Diagnosis not present

## 2012-03-04 DIAGNOSIS — E878 Other disorders of electrolyte and fluid balance, not elsewhere classified: Secondary | ICD-10-CM | POA: Diagnosis not present

## 2012-03-04 DIAGNOSIS — N186 End stage renal disease: Secondary | ICD-10-CM | POA: Diagnosis not present

## 2012-03-04 DIAGNOSIS — D509 Iron deficiency anemia, unspecified: Secondary | ICD-10-CM | POA: Diagnosis not present

## 2012-03-04 DIAGNOSIS — E119 Type 2 diabetes mellitus without complications: Secondary | ICD-10-CM | POA: Diagnosis not present

## 2012-03-07 DIAGNOSIS — E785 Hyperlipidemia, unspecified: Secondary | ICD-10-CM | POA: Diagnosis not present

## 2012-03-07 DIAGNOSIS — D509 Iron deficiency anemia, unspecified: Secondary | ICD-10-CM | POA: Diagnosis not present

## 2012-03-07 DIAGNOSIS — E119 Type 2 diabetes mellitus without complications: Secondary | ICD-10-CM | POA: Diagnosis not present

## 2012-03-07 DIAGNOSIS — N186 End stage renal disease: Secondary | ICD-10-CM | POA: Diagnosis not present

## 2012-03-07 DIAGNOSIS — D631 Anemia in chronic kidney disease: Secondary | ICD-10-CM | POA: Diagnosis not present

## 2012-03-07 DIAGNOSIS — E878 Other disorders of electrolyte and fluid balance, not elsewhere classified: Secondary | ICD-10-CM | POA: Diagnosis not present

## 2012-03-09 DIAGNOSIS — E119 Type 2 diabetes mellitus without complications: Secondary | ICD-10-CM | POA: Diagnosis not present

## 2012-03-09 DIAGNOSIS — N039 Chronic nephritic syndrome with unspecified morphologic changes: Secondary | ICD-10-CM | POA: Diagnosis not present

## 2012-03-09 DIAGNOSIS — E878 Other disorders of electrolyte and fluid balance, not elsewhere classified: Secondary | ICD-10-CM | POA: Diagnosis not present

## 2012-03-09 DIAGNOSIS — D509 Iron deficiency anemia, unspecified: Secondary | ICD-10-CM | POA: Diagnosis not present

## 2012-03-09 DIAGNOSIS — E785 Hyperlipidemia, unspecified: Secondary | ICD-10-CM | POA: Diagnosis not present

## 2012-03-09 DIAGNOSIS — N186 End stage renal disease: Secondary | ICD-10-CM | POA: Diagnosis not present

## 2012-03-11 DIAGNOSIS — E785 Hyperlipidemia, unspecified: Secondary | ICD-10-CM | POA: Diagnosis not present

## 2012-03-11 DIAGNOSIS — N039 Chronic nephritic syndrome with unspecified morphologic changes: Secondary | ICD-10-CM | POA: Diagnosis not present

## 2012-03-11 DIAGNOSIS — E878 Other disorders of electrolyte and fluid balance, not elsewhere classified: Secondary | ICD-10-CM | POA: Diagnosis not present

## 2012-03-11 DIAGNOSIS — N186 End stage renal disease: Secondary | ICD-10-CM | POA: Diagnosis not present

## 2012-03-11 DIAGNOSIS — D509 Iron deficiency anemia, unspecified: Secondary | ICD-10-CM | POA: Diagnosis not present

## 2012-03-11 DIAGNOSIS — E119 Type 2 diabetes mellitus without complications: Secondary | ICD-10-CM | POA: Diagnosis not present

## 2012-03-14 DIAGNOSIS — E119 Type 2 diabetes mellitus without complications: Secondary | ICD-10-CM | POA: Diagnosis not present

## 2012-03-14 DIAGNOSIS — N039 Chronic nephritic syndrome with unspecified morphologic changes: Secondary | ICD-10-CM | POA: Diagnosis not present

## 2012-03-14 DIAGNOSIS — D509 Iron deficiency anemia, unspecified: Secondary | ICD-10-CM | POA: Diagnosis not present

## 2012-03-14 DIAGNOSIS — N186 End stage renal disease: Secondary | ICD-10-CM | POA: Diagnosis not present

## 2012-03-14 DIAGNOSIS — E785 Hyperlipidemia, unspecified: Secondary | ICD-10-CM | POA: Diagnosis not present

## 2012-03-14 DIAGNOSIS — E878 Other disorders of electrolyte and fluid balance, not elsewhere classified: Secondary | ICD-10-CM | POA: Diagnosis not present

## 2012-03-16 DIAGNOSIS — E1129 Type 2 diabetes mellitus with other diabetic kidney complication: Secondary | ICD-10-CM | POA: Diagnosis not present

## 2012-03-16 DIAGNOSIS — E878 Other disorders of electrolyte and fluid balance, not elsewhere classified: Secondary | ICD-10-CM | POA: Diagnosis not present

## 2012-03-16 DIAGNOSIS — N186 End stage renal disease: Secondary | ICD-10-CM | POA: Diagnosis not present

## 2012-03-16 DIAGNOSIS — E785 Hyperlipidemia, unspecified: Secondary | ICD-10-CM | POA: Diagnosis not present

## 2012-03-16 DIAGNOSIS — E119 Type 2 diabetes mellitus without complications: Secondary | ICD-10-CM | POA: Diagnosis not present

## 2012-03-16 DIAGNOSIS — D509 Iron deficiency anemia, unspecified: Secondary | ICD-10-CM | POA: Diagnosis not present

## 2012-03-16 DIAGNOSIS — N039 Chronic nephritic syndrome with unspecified morphologic changes: Secondary | ICD-10-CM | POA: Diagnosis not present

## 2012-03-17 DIAGNOSIS — D631 Anemia in chronic kidney disease: Secondary | ICD-10-CM | POA: Diagnosis not present

## 2012-03-17 DIAGNOSIS — N186 End stage renal disease: Secondary | ICD-10-CM | POA: Diagnosis not present

## 2012-03-17 DIAGNOSIS — E785 Hyperlipidemia, unspecified: Secondary | ICD-10-CM | POA: Diagnosis not present

## 2012-03-17 DIAGNOSIS — E119 Type 2 diabetes mellitus without complications: Secondary | ICD-10-CM | POA: Diagnosis not present

## 2012-03-17 DIAGNOSIS — E878 Other disorders of electrolyte and fluid balance, not elsewhere classified: Secondary | ICD-10-CM | POA: Diagnosis not present

## 2012-03-17 DIAGNOSIS — D509 Iron deficiency anemia, unspecified: Secondary | ICD-10-CM | POA: Diagnosis not present

## 2012-03-17 DIAGNOSIS — H16209 Unspecified keratoconjunctivitis, unspecified eye: Secondary | ICD-10-CM | POA: Diagnosis not present

## 2012-03-19 ENCOUNTER — Emergency Department: Payer: Self-pay | Admitting: Emergency Medicine

## 2012-03-19 DIAGNOSIS — IMO0002 Reserved for concepts with insufficient information to code with codable children: Secondary | ICD-10-CM | POA: Diagnosis not present

## 2012-03-19 DIAGNOSIS — S41109A Unspecified open wound of unspecified upper arm, initial encounter: Secondary | ICD-10-CM | POA: Diagnosis not present

## 2012-03-19 DIAGNOSIS — Z882 Allergy status to sulfonamides status: Secondary | ICD-10-CM | POA: Diagnosis not present

## 2012-03-20 DIAGNOSIS — E878 Other disorders of electrolyte and fluid balance, not elsewhere classified: Secondary | ICD-10-CM | POA: Diagnosis not present

## 2012-03-20 DIAGNOSIS — D509 Iron deficiency anemia, unspecified: Secondary | ICD-10-CM | POA: Diagnosis not present

## 2012-03-20 DIAGNOSIS — N186 End stage renal disease: Secondary | ICD-10-CM | POA: Diagnosis not present

## 2012-03-20 DIAGNOSIS — E785 Hyperlipidemia, unspecified: Secondary | ICD-10-CM | POA: Diagnosis not present

## 2012-03-20 DIAGNOSIS — E119 Type 2 diabetes mellitus without complications: Secondary | ICD-10-CM | POA: Diagnosis not present

## 2012-03-20 DIAGNOSIS — N039 Chronic nephritic syndrome with unspecified morphologic changes: Secondary | ICD-10-CM | POA: Diagnosis not present

## 2012-03-23 DIAGNOSIS — E119 Type 2 diabetes mellitus without complications: Secondary | ICD-10-CM | POA: Diagnosis not present

## 2012-03-23 DIAGNOSIS — D509 Iron deficiency anemia, unspecified: Secondary | ICD-10-CM | POA: Diagnosis not present

## 2012-03-23 DIAGNOSIS — N186 End stage renal disease: Secondary | ICD-10-CM | POA: Diagnosis not present

## 2012-03-23 DIAGNOSIS — E878 Other disorders of electrolyte and fluid balance, not elsewhere classified: Secondary | ICD-10-CM | POA: Diagnosis not present

## 2012-03-23 DIAGNOSIS — N039 Chronic nephritic syndrome with unspecified morphologic changes: Secondary | ICD-10-CM | POA: Diagnosis not present

## 2012-03-23 DIAGNOSIS — E785 Hyperlipidemia, unspecified: Secondary | ICD-10-CM | POA: Diagnosis not present

## 2012-03-25 DIAGNOSIS — N186 End stage renal disease: Secondary | ICD-10-CM | POA: Diagnosis not present

## 2012-03-25 DIAGNOSIS — D509 Iron deficiency anemia, unspecified: Secondary | ICD-10-CM | POA: Diagnosis not present

## 2012-03-25 DIAGNOSIS — N039 Chronic nephritic syndrome with unspecified morphologic changes: Secondary | ICD-10-CM | POA: Diagnosis not present

## 2012-03-25 DIAGNOSIS — E785 Hyperlipidemia, unspecified: Secondary | ICD-10-CM | POA: Diagnosis not present

## 2012-03-25 DIAGNOSIS — E119 Type 2 diabetes mellitus without complications: Secondary | ICD-10-CM | POA: Diagnosis not present

## 2012-03-25 DIAGNOSIS — E878 Other disorders of electrolyte and fluid balance, not elsewhere classified: Secondary | ICD-10-CM | POA: Diagnosis not present

## 2012-03-27 DIAGNOSIS — D509 Iron deficiency anemia, unspecified: Secondary | ICD-10-CM | POA: Diagnosis not present

## 2012-03-27 DIAGNOSIS — E878 Other disorders of electrolyte and fluid balance, not elsewhere classified: Secondary | ICD-10-CM | POA: Diagnosis not present

## 2012-03-27 DIAGNOSIS — N039 Chronic nephritic syndrome with unspecified morphologic changes: Secondary | ICD-10-CM | POA: Diagnosis not present

## 2012-03-27 DIAGNOSIS — E119 Type 2 diabetes mellitus without complications: Secondary | ICD-10-CM | POA: Diagnosis not present

## 2012-03-27 DIAGNOSIS — E785 Hyperlipidemia, unspecified: Secondary | ICD-10-CM | POA: Diagnosis not present

## 2012-03-27 DIAGNOSIS — N186 End stage renal disease: Secondary | ICD-10-CM | POA: Diagnosis not present

## 2012-03-28 DIAGNOSIS — N186 End stage renal disease: Secondary | ICD-10-CM | POA: Diagnosis not present

## 2012-03-29 DIAGNOSIS — R001 Bradycardia, unspecified: Secondary | ICD-10-CM

## 2012-03-29 HISTORY — DX: Bradycardia, unspecified: R00.1

## 2012-03-30 DIAGNOSIS — E878 Other disorders of electrolyte and fluid balance, not elsewhere classified: Secondary | ICD-10-CM | POA: Diagnosis not present

## 2012-03-30 DIAGNOSIS — N2581 Secondary hyperparathyroidism of renal origin: Secondary | ICD-10-CM | POA: Diagnosis not present

## 2012-03-30 DIAGNOSIS — E119 Type 2 diabetes mellitus without complications: Secondary | ICD-10-CM | POA: Diagnosis not present

## 2012-03-30 DIAGNOSIS — N186 End stage renal disease: Secondary | ICD-10-CM | POA: Diagnosis not present

## 2012-03-30 DIAGNOSIS — D509 Iron deficiency anemia, unspecified: Secondary | ICD-10-CM | POA: Diagnosis not present

## 2012-03-30 DIAGNOSIS — D631 Anemia in chronic kidney disease: Secondary | ICD-10-CM | POA: Diagnosis not present

## 2012-04-01 DIAGNOSIS — D509 Iron deficiency anemia, unspecified: Secondary | ICD-10-CM | POA: Diagnosis not present

## 2012-04-01 DIAGNOSIS — N039 Chronic nephritic syndrome with unspecified morphologic changes: Secondary | ICD-10-CM | POA: Diagnosis not present

## 2012-04-01 DIAGNOSIS — E119 Type 2 diabetes mellitus without complications: Secondary | ICD-10-CM | POA: Diagnosis not present

## 2012-04-01 DIAGNOSIS — N2581 Secondary hyperparathyroidism of renal origin: Secondary | ICD-10-CM | POA: Diagnosis not present

## 2012-04-01 DIAGNOSIS — N186 End stage renal disease: Secondary | ICD-10-CM | POA: Diagnosis not present

## 2012-04-01 DIAGNOSIS — E878 Other disorders of electrolyte and fluid balance, not elsewhere classified: Secondary | ICD-10-CM | POA: Diagnosis not present

## 2012-04-04 DIAGNOSIS — N186 End stage renal disease: Secondary | ICD-10-CM | POA: Diagnosis not present

## 2012-04-04 DIAGNOSIS — N2581 Secondary hyperparathyroidism of renal origin: Secondary | ICD-10-CM | POA: Diagnosis not present

## 2012-04-04 DIAGNOSIS — N039 Chronic nephritic syndrome with unspecified morphologic changes: Secondary | ICD-10-CM | POA: Diagnosis not present

## 2012-04-04 DIAGNOSIS — D509 Iron deficiency anemia, unspecified: Secondary | ICD-10-CM | POA: Diagnosis not present

## 2012-04-04 DIAGNOSIS — E119 Type 2 diabetes mellitus without complications: Secondary | ICD-10-CM | POA: Diagnosis not present

## 2012-04-04 DIAGNOSIS — E878 Other disorders of electrolyte and fluid balance, not elsewhere classified: Secondary | ICD-10-CM | POA: Diagnosis not present

## 2012-04-06 DIAGNOSIS — N186 End stage renal disease: Secondary | ICD-10-CM | POA: Diagnosis not present

## 2012-04-06 DIAGNOSIS — D631 Anemia in chronic kidney disease: Secondary | ICD-10-CM | POA: Diagnosis not present

## 2012-04-06 DIAGNOSIS — E878 Other disorders of electrolyte and fluid balance, not elsewhere classified: Secondary | ICD-10-CM | POA: Diagnosis not present

## 2012-04-06 DIAGNOSIS — D509 Iron deficiency anemia, unspecified: Secondary | ICD-10-CM | POA: Diagnosis not present

## 2012-04-06 DIAGNOSIS — N2581 Secondary hyperparathyroidism of renal origin: Secondary | ICD-10-CM | POA: Diagnosis not present

## 2012-04-06 DIAGNOSIS — E119 Type 2 diabetes mellitus without complications: Secondary | ICD-10-CM | POA: Diagnosis not present

## 2012-04-08 DIAGNOSIS — N039 Chronic nephritic syndrome with unspecified morphologic changes: Secondary | ICD-10-CM | POA: Diagnosis not present

## 2012-04-08 DIAGNOSIS — D509 Iron deficiency anemia, unspecified: Secondary | ICD-10-CM | POA: Diagnosis not present

## 2012-04-08 DIAGNOSIS — N2581 Secondary hyperparathyroidism of renal origin: Secondary | ICD-10-CM | POA: Diagnosis not present

## 2012-04-08 DIAGNOSIS — E119 Type 2 diabetes mellitus without complications: Secondary | ICD-10-CM | POA: Diagnosis not present

## 2012-04-08 DIAGNOSIS — E878 Other disorders of electrolyte and fluid balance, not elsewhere classified: Secondary | ICD-10-CM | POA: Diagnosis not present

## 2012-04-08 DIAGNOSIS — N186 End stage renal disease: Secondary | ICD-10-CM | POA: Diagnosis not present

## 2012-04-11 DIAGNOSIS — D509 Iron deficiency anemia, unspecified: Secondary | ICD-10-CM | POA: Diagnosis not present

## 2012-04-11 DIAGNOSIS — E878 Other disorders of electrolyte and fluid balance, not elsewhere classified: Secondary | ICD-10-CM | POA: Diagnosis not present

## 2012-04-11 DIAGNOSIS — D631 Anemia in chronic kidney disease: Secondary | ICD-10-CM | POA: Diagnosis not present

## 2012-04-11 DIAGNOSIS — N186 End stage renal disease: Secondary | ICD-10-CM | POA: Diagnosis not present

## 2012-04-11 DIAGNOSIS — N2581 Secondary hyperparathyroidism of renal origin: Secondary | ICD-10-CM | POA: Diagnosis not present

## 2012-04-11 DIAGNOSIS — E119 Type 2 diabetes mellitus without complications: Secondary | ICD-10-CM | POA: Diagnosis not present

## 2012-04-13 DIAGNOSIS — N186 End stage renal disease: Secondary | ICD-10-CM | POA: Diagnosis not present

## 2012-04-13 DIAGNOSIS — E119 Type 2 diabetes mellitus without complications: Secondary | ICD-10-CM | POA: Diagnosis not present

## 2012-04-13 DIAGNOSIS — E878 Other disorders of electrolyte and fluid balance, not elsewhere classified: Secondary | ICD-10-CM | POA: Diagnosis not present

## 2012-04-13 DIAGNOSIS — D631 Anemia in chronic kidney disease: Secondary | ICD-10-CM | POA: Diagnosis not present

## 2012-04-13 DIAGNOSIS — N2581 Secondary hyperparathyroidism of renal origin: Secondary | ICD-10-CM | POA: Diagnosis not present

## 2012-04-13 DIAGNOSIS — D509 Iron deficiency anemia, unspecified: Secondary | ICD-10-CM | POA: Diagnosis not present

## 2012-04-15 DIAGNOSIS — E119 Type 2 diabetes mellitus without complications: Secondary | ICD-10-CM | POA: Diagnosis not present

## 2012-04-15 DIAGNOSIS — D631 Anemia in chronic kidney disease: Secondary | ICD-10-CM | POA: Diagnosis not present

## 2012-04-15 DIAGNOSIS — D509 Iron deficiency anemia, unspecified: Secondary | ICD-10-CM | POA: Diagnosis not present

## 2012-04-15 DIAGNOSIS — N2581 Secondary hyperparathyroidism of renal origin: Secondary | ICD-10-CM | POA: Diagnosis not present

## 2012-04-15 DIAGNOSIS — N186 End stage renal disease: Secondary | ICD-10-CM | POA: Diagnosis not present

## 2012-04-15 DIAGNOSIS — E878 Other disorders of electrolyte and fluid balance, not elsewhere classified: Secondary | ICD-10-CM | POA: Diagnosis not present

## 2012-04-18 ENCOUNTER — Telehealth: Payer: Self-pay | Admitting: Family Medicine

## 2012-04-18 DIAGNOSIS — D509 Iron deficiency anemia, unspecified: Secondary | ICD-10-CM | POA: Diagnosis not present

## 2012-04-18 DIAGNOSIS — N2581 Secondary hyperparathyroidism of renal origin: Secondary | ICD-10-CM | POA: Diagnosis not present

## 2012-04-18 DIAGNOSIS — E119 Type 2 diabetes mellitus without complications: Secondary | ICD-10-CM | POA: Diagnosis not present

## 2012-04-18 DIAGNOSIS — E878 Other disorders of electrolyte and fluid balance, not elsewhere classified: Secondary | ICD-10-CM | POA: Diagnosis not present

## 2012-04-18 DIAGNOSIS — N186 End stage renal disease: Secondary | ICD-10-CM | POA: Diagnosis not present

## 2012-04-18 DIAGNOSIS — N039 Chronic nephritic syndrome with unspecified morphologic changes: Secondary | ICD-10-CM | POA: Diagnosis not present

## 2012-04-18 NOTE — Telephone Encounter (Signed)
Pt called and scheduled a new pt apptmt, but the first new MCR pt apptmt you have available is 07/06/2012 at 2:15 p.m. Pt says she is on dialysis and is having some issues with her b/p dropping.  She says needs an apptmt before April. Can you accommodate her? Thank you.

## 2012-04-18 NOTE — Telephone Encounter (Signed)
May place march 6th at 3:00 pm, blocking 3:30pm appt. In interim recommend she f/u with renal doctor or current primary doctor for blood pressure issues.

## 2012-04-19 NOTE — Telephone Encounter (Signed)
Rescheduled to 06/01/2012 at 3:00 p.m. For a 45 min apptmt

## 2012-04-20 DIAGNOSIS — D631 Anemia in chronic kidney disease: Secondary | ICD-10-CM | POA: Diagnosis not present

## 2012-04-20 DIAGNOSIS — E878 Other disorders of electrolyte and fluid balance, not elsewhere classified: Secondary | ICD-10-CM | POA: Diagnosis not present

## 2012-04-20 DIAGNOSIS — N2581 Secondary hyperparathyroidism of renal origin: Secondary | ICD-10-CM | POA: Diagnosis not present

## 2012-04-20 DIAGNOSIS — D509 Iron deficiency anemia, unspecified: Secondary | ICD-10-CM | POA: Diagnosis not present

## 2012-04-20 DIAGNOSIS — E119 Type 2 diabetes mellitus without complications: Secondary | ICD-10-CM | POA: Diagnosis not present

## 2012-04-20 DIAGNOSIS — N186 End stage renal disease: Secondary | ICD-10-CM | POA: Diagnosis not present

## 2012-04-22 DIAGNOSIS — D631 Anemia in chronic kidney disease: Secondary | ICD-10-CM | POA: Diagnosis not present

## 2012-04-22 DIAGNOSIS — N2581 Secondary hyperparathyroidism of renal origin: Secondary | ICD-10-CM | POA: Diagnosis not present

## 2012-04-22 DIAGNOSIS — N186 End stage renal disease: Secondary | ICD-10-CM | POA: Diagnosis not present

## 2012-04-22 DIAGNOSIS — E878 Other disorders of electrolyte and fluid balance, not elsewhere classified: Secondary | ICD-10-CM | POA: Diagnosis not present

## 2012-04-22 DIAGNOSIS — D509 Iron deficiency anemia, unspecified: Secondary | ICD-10-CM | POA: Diagnosis not present

## 2012-04-22 DIAGNOSIS — E119 Type 2 diabetes mellitus without complications: Secondary | ICD-10-CM | POA: Diagnosis not present

## 2012-04-26 DIAGNOSIS — E119 Type 2 diabetes mellitus without complications: Secondary | ICD-10-CM | POA: Diagnosis not present

## 2012-04-26 DIAGNOSIS — E878 Other disorders of electrolyte and fluid balance, not elsewhere classified: Secondary | ICD-10-CM | POA: Diagnosis not present

## 2012-04-26 DIAGNOSIS — N039 Chronic nephritic syndrome with unspecified morphologic changes: Secondary | ICD-10-CM | POA: Diagnosis not present

## 2012-04-26 DIAGNOSIS — D509 Iron deficiency anemia, unspecified: Secondary | ICD-10-CM | POA: Diagnosis not present

## 2012-04-26 DIAGNOSIS — N186 End stage renal disease: Secondary | ICD-10-CM | POA: Diagnosis not present

## 2012-04-26 DIAGNOSIS — N2581 Secondary hyperparathyroidism of renal origin: Secondary | ICD-10-CM | POA: Diagnosis not present

## 2012-04-27 ENCOUNTER — Emergency Department: Payer: Self-pay | Admitting: Emergency Medicine

## 2012-04-27 DIAGNOSIS — N2581 Secondary hyperparathyroidism of renal origin: Secondary | ICD-10-CM | POA: Diagnosis not present

## 2012-04-27 DIAGNOSIS — D631 Anemia in chronic kidney disease: Secondary | ICD-10-CM | POA: Diagnosis not present

## 2012-04-27 DIAGNOSIS — E119 Type 2 diabetes mellitus without complications: Secondary | ICD-10-CM | POA: Diagnosis not present

## 2012-04-27 DIAGNOSIS — N186 End stage renal disease: Secondary | ICD-10-CM | POA: Diagnosis not present

## 2012-04-27 DIAGNOSIS — D509 Iron deficiency anemia, unspecified: Secondary | ICD-10-CM | POA: Diagnosis not present

## 2012-04-27 DIAGNOSIS — E878 Other disorders of electrolyte and fluid balance, not elsewhere classified: Secondary | ICD-10-CM | POA: Diagnosis not present

## 2012-04-27 DIAGNOSIS — I959 Hypotension, unspecified: Secondary | ICD-10-CM | POA: Diagnosis not present

## 2012-04-27 LAB — COMPREHENSIVE METABOLIC PANEL
BUN: 21 mg/dL — ABNORMAL HIGH (ref 7–18)
Co2: 30 mmol/L (ref 21–32)
Creatinine: 3.48 mg/dL — ABNORMAL HIGH (ref 0.60–1.30)
EGFR (African American): 14 — ABNORMAL LOW
EGFR (Non-African Amer.): 12 — ABNORMAL LOW
Glucose: 150 mg/dL — ABNORMAL HIGH (ref 65–99)
Osmolality: 278 (ref 275–301)
Potassium: 3.8 mmol/L (ref 3.5–5.1)
SGOT(AST): 49 U/L — ABNORMAL HIGH (ref 15–37)
SGPT (ALT): 46 U/L (ref 12–78)
Sodium: 136 mmol/L (ref 136–145)
Total Protein: 9.1 g/dL — ABNORMAL HIGH (ref 6.4–8.2)

## 2012-04-27 LAB — CBC
HCT: 40.5 % (ref 35.0–47.0)
HGB: 13.7 g/dL (ref 12.0–16.0)
MCHC: 33.8 g/dL (ref 32.0–36.0)
Platelet: 237 10*3/uL (ref 150–440)
WBC: 8 10*3/uL (ref 3.6–11.0)

## 2012-04-28 DIAGNOSIS — N186 End stage renal disease: Secondary | ICD-10-CM | POA: Diagnosis not present

## 2012-04-29 DIAGNOSIS — N186 End stage renal disease: Secondary | ICD-10-CM | POA: Diagnosis not present

## 2012-04-29 DIAGNOSIS — E119 Type 2 diabetes mellitus without complications: Secondary | ICD-10-CM | POA: Diagnosis not present

## 2012-04-29 DIAGNOSIS — N2581 Secondary hyperparathyroidism of renal origin: Secondary | ICD-10-CM | POA: Diagnosis not present

## 2012-04-29 DIAGNOSIS — E878 Other disorders of electrolyte and fluid balance, not elsewhere classified: Secondary | ICD-10-CM | POA: Diagnosis not present

## 2012-04-29 DIAGNOSIS — D631 Anemia in chronic kidney disease: Secondary | ICD-10-CM | POA: Diagnosis not present

## 2012-04-29 DIAGNOSIS — D509 Iron deficiency anemia, unspecified: Secondary | ICD-10-CM | POA: Diagnosis not present

## 2012-05-02 DIAGNOSIS — N2581 Secondary hyperparathyroidism of renal origin: Secondary | ICD-10-CM | POA: Diagnosis not present

## 2012-05-02 DIAGNOSIS — D509 Iron deficiency anemia, unspecified: Secondary | ICD-10-CM | POA: Diagnosis not present

## 2012-05-02 DIAGNOSIS — E878 Other disorders of electrolyte and fluid balance, not elsewhere classified: Secondary | ICD-10-CM | POA: Diagnosis not present

## 2012-05-02 DIAGNOSIS — E119 Type 2 diabetes mellitus without complications: Secondary | ICD-10-CM | POA: Diagnosis not present

## 2012-05-02 DIAGNOSIS — D631 Anemia in chronic kidney disease: Secondary | ICD-10-CM | POA: Diagnosis not present

## 2012-05-02 DIAGNOSIS — N186 End stage renal disease: Secondary | ICD-10-CM | POA: Diagnosis not present

## 2012-05-04 DIAGNOSIS — E878 Other disorders of electrolyte and fluid balance, not elsewhere classified: Secondary | ICD-10-CM | POA: Diagnosis not present

## 2012-05-04 DIAGNOSIS — N186 End stage renal disease: Secondary | ICD-10-CM | POA: Diagnosis not present

## 2012-05-04 DIAGNOSIS — E119 Type 2 diabetes mellitus without complications: Secondary | ICD-10-CM | POA: Diagnosis not present

## 2012-05-04 DIAGNOSIS — N2581 Secondary hyperparathyroidism of renal origin: Secondary | ICD-10-CM | POA: Diagnosis not present

## 2012-05-04 DIAGNOSIS — D509 Iron deficiency anemia, unspecified: Secondary | ICD-10-CM | POA: Diagnosis not present

## 2012-05-04 DIAGNOSIS — N039 Chronic nephritic syndrome with unspecified morphologic changes: Secondary | ICD-10-CM | POA: Diagnosis not present

## 2012-05-06 DIAGNOSIS — D509 Iron deficiency anemia, unspecified: Secondary | ICD-10-CM | POA: Diagnosis not present

## 2012-05-06 DIAGNOSIS — E119 Type 2 diabetes mellitus without complications: Secondary | ICD-10-CM | POA: Diagnosis not present

## 2012-05-06 DIAGNOSIS — N186 End stage renal disease: Secondary | ICD-10-CM | POA: Diagnosis not present

## 2012-05-06 DIAGNOSIS — D631 Anemia in chronic kidney disease: Secondary | ICD-10-CM | POA: Diagnosis not present

## 2012-05-06 DIAGNOSIS — N2581 Secondary hyperparathyroidism of renal origin: Secondary | ICD-10-CM | POA: Diagnosis not present

## 2012-05-06 DIAGNOSIS — E878 Other disorders of electrolyte and fluid balance, not elsewhere classified: Secondary | ICD-10-CM | POA: Diagnosis not present

## 2012-05-09 DIAGNOSIS — N186 End stage renal disease: Secondary | ICD-10-CM | POA: Diagnosis not present

## 2012-05-09 DIAGNOSIS — D631 Anemia in chronic kidney disease: Secondary | ICD-10-CM | POA: Diagnosis not present

## 2012-05-09 DIAGNOSIS — N2581 Secondary hyperparathyroidism of renal origin: Secondary | ICD-10-CM | POA: Diagnosis not present

## 2012-05-09 DIAGNOSIS — E119 Type 2 diabetes mellitus without complications: Secondary | ICD-10-CM | POA: Diagnosis not present

## 2012-05-09 DIAGNOSIS — E878 Other disorders of electrolyte and fluid balance, not elsewhere classified: Secondary | ICD-10-CM | POA: Diagnosis not present

## 2012-05-09 DIAGNOSIS — D509 Iron deficiency anemia, unspecified: Secondary | ICD-10-CM | POA: Diagnosis not present

## 2012-05-11 DIAGNOSIS — E119 Type 2 diabetes mellitus without complications: Secondary | ICD-10-CM | POA: Diagnosis not present

## 2012-05-11 DIAGNOSIS — E878 Other disorders of electrolyte and fluid balance, not elsewhere classified: Secondary | ICD-10-CM | POA: Diagnosis not present

## 2012-05-11 DIAGNOSIS — N2581 Secondary hyperparathyroidism of renal origin: Secondary | ICD-10-CM | POA: Diagnosis not present

## 2012-05-11 DIAGNOSIS — N186 End stage renal disease: Secondary | ICD-10-CM | POA: Diagnosis not present

## 2012-05-11 DIAGNOSIS — D631 Anemia in chronic kidney disease: Secondary | ICD-10-CM | POA: Diagnosis not present

## 2012-05-11 DIAGNOSIS — D509 Iron deficiency anemia, unspecified: Secondary | ICD-10-CM | POA: Diagnosis not present

## 2012-05-13 DIAGNOSIS — N039 Chronic nephritic syndrome with unspecified morphologic changes: Secondary | ICD-10-CM | POA: Diagnosis not present

## 2012-05-13 DIAGNOSIS — E119 Type 2 diabetes mellitus without complications: Secondary | ICD-10-CM | POA: Diagnosis not present

## 2012-05-13 DIAGNOSIS — N186 End stage renal disease: Secondary | ICD-10-CM | POA: Diagnosis not present

## 2012-05-13 DIAGNOSIS — N2581 Secondary hyperparathyroidism of renal origin: Secondary | ICD-10-CM | POA: Diagnosis not present

## 2012-05-13 DIAGNOSIS — E878 Other disorders of electrolyte and fluid balance, not elsewhere classified: Secondary | ICD-10-CM | POA: Diagnosis not present

## 2012-05-13 DIAGNOSIS — D509 Iron deficiency anemia, unspecified: Secondary | ICD-10-CM | POA: Diagnosis not present

## 2012-05-16 DIAGNOSIS — N2581 Secondary hyperparathyroidism of renal origin: Secondary | ICD-10-CM | POA: Diagnosis not present

## 2012-05-16 DIAGNOSIS — N186 End stage renal disease: Secondary | ICD-10-CM | POA: Diagnosis not present

## 2012-05-16 DIAGNOSIS — D509 Iron deficiency anemia, unspecified: Secondary | ICD-10-CM | POA: Diagnosis not present

## 2012-05-16 DIAGNOSIS — E119 Type 2 diabetes mellitus without complications: Secondary | ICD-10-CM | POA: Diagnosis not present

## 2012-05-16 DIAGNOSIS — E878 Other disorders of electrolyte and fluid balance, not elsewhere classified: Secondary | ICD-10-CM | POA: Diagnosis not present

## 2012-05-16 DIAGNOSIS — D631 Anemia in chronic kidney disease: Secondary | ICD-10-CM | POA: Diagnosis not present

## 2012-05-18 DIAGNOSIS — D509 Iron deficiency anemia, unspecified: Secondary | ICD-10-CM | POA: Diagnosis not present

## 2012-05-18 DIAGNOSIS — E119 Type 2 diabetes mellitus without complications: Secondary | ICD-10-CM | POA: Diagnosis not present

## 2012-05-18 DIAGNOSIS — N2581 Secondary hyperparathyroidism of renal origin: Secondary | ICD-10-CM | POA: Diagnosis not present

## 2012-05-18 DIAGNOSIS — D631 Anemia in chronic kidney disease: Secondary | ICD-10-CM | POA: Diagnosis not present

## 2012-05-18 DIAGNOSIS — E878 Other disorders of electrolyte and fluid balance, not elsewhere classified: Secondary | ICD-10-CM | POA: Diagnosis not present

## 2012-05-18 DIAGNOSIS — N186 End stage renal disease: Secondary | ICD-10-CM | POA: Diagnosis not present

## 2012-05-20 DIAGNOSIS — E119 Type 2 diabetes mellitus without complications: Secondary | ICD-10-CM | POA: Diagnosis not present

## 2012-05-20 DIAGNOSIS — D631 Anemia in chronic kidney disease: Secondary | ICD-10-CM | POA: Diagnosis not present

## 2012-05-20 DIAGNOSIS — N186 End stage renal disease: Secondary | ICD-10-CM | POA: Diagnosis not present

## 2012-05-20 DIAGNOSIS — N2581 Secondary hyperparathyroidism of renal origin: Secondary | ICD-10-CM | POA: Diagnosis not present

## 2012-05-20 DIAGNOSIS — E878 Other disorders of electrolyte and fluid balance, not elsewhere classified: Secondary | ICD-10-CM | POA: Diagnosis not present

## 2012-05-20 DIAGNOSIS — D509 Iron deficiency anemia, unspecified: Secondary | ICD-10-CM | POA: Diagnosis not present

## 2012-05-23 DIAGNOSIS — N039 Chronic nephritic syndrome with unspecified morphologic changes: Secondary | ICD-10-CM | POA: Diagnosis not present

## 2012-05-23 DIAGNOSIS — E878 Other disorders of electrolyte and fluid balance, not elsewhere classified: Secondary | ICD-10-CM | POA: Diagnosis not present

## 2012-05-23 DIAGNOSIS — N2581 Secondary hyperparathyroidism of renal origin: Secondary | ICD-10-CM | POA: Diagnosis not present

## 2012-05-23 DIAGNOSIS — D509 Iron deficiency anemia, unspecified: Secondary | ICD-10-CM | POA: Diagnosis not present

## 2012-05-23 DIAGNOSIS — N186 End stage renal disease: Secondary | ICD-10-CM | POA: Diagnosis not present

## 2012-05-23 DIAGNOSIS — E119 Type 2 diabetes mellitus without complications: Secondary | ICD-10-CM | POA: Diagnosis not present

## 2012-05-25 DIAGNOSIS — D631 Anemia in chronic kidney disease: Secondary | ICD-10-CM | POA: Diagnosis not present

## 2012-05-25 DIAGNOSIS — N186 End stage renal disease: Secondary | ICD-10-CM | POA: Diagnosis not present

## 2012-05-25 DIAGNOSIS — D509 Iron deficiency anemia, unspecified: Secondary | ICD-10-CM | POA: Diagnosis not present

## 2012-05-25 DIAGNOSIS — E119 Type 2 diabetes mellitus without complications: Secondary | ICD-10-CM | POA: Diagnosis not present

## 2012-05-25 DIAGNOSIS — N2581 Secondary hyperparathyroidism of renal origin: Secondary | ICD-10-CM | POA: Diagnosis not present

## 2012-05-25 DIAGNOSIS — Z992 Dependence on renal dialysis: Secondary | ICD-10-CM | POA: Diagnosis not present

## 2012-05-25 DIAGNOSIS — E878 Other disorders of electrolyte and fluid balance, not elsewhere classified: Secondary | ICD-10-CM | POA: Diagnosis not present

## 2012-05-26 DIAGNOSIS — N186 End stage renal disease: Secondary | ICD-10-CM | POA: Diagnosis not present

## 2012-05-27 DIAGNOSIS — N2581 Secondary hyperparathyroidism of renal origin: Secondary | ICD-10-CM | POA: Diagnosis not present

## 2012-05-27 DIAGNOSIS — D509 Iron deficiency anemia, unspecified: Secondary | ICD-10-CM | POA: Diagnosis not present

## 2012-05-27 DIAGNOSIS — D631 Anemia in chronic kidney disease: Secondary | ICD-10-CM | POA: Diagnosis not present

## 2012-05-27 DIAGNOSIS — E119 Type 2 diabetes mellitus without complications: Secondary | ICD-10-CM | POA: Diagnosis not present

## 2012-05-27 DIAGNOSIS — E1129 Type 2 diabetes mellitus with other diabetic kidney complication: Secondary | ICD-10-CM | POA: Diagnosis not present

## 2012-05-27 DIAGNOSIS — Z23 Encounter for immunization: Secondary | ICD-10-CM | POA: Diagnosis not present

## 2012-05-27 DIAGNOSIS — N186 End stage renal disease: Secondary | ICD-10-CM | POA: Diagnosis not present

## 2012-05-27 DIAGNOSIS — E785 Hyperlipidemia, unspecified: Secondary | ICD-10-CM | POA: Diagnosis not present

## 2012-06-01 ENCOUNTER — Encounter: Payer: Self-pay | Admitting: Family Medicine

## 2012-06-01 ENCOUNTER — Ambulatory Visit (INDEPENDENT_AMBULATORY_CARE_PROVIDER_SITE_OTHER): Payer: Medicare Other | Admitting: Family Medicine

## 2012-06-01 VITALS — BP 160/77 | HR 68 | Temp 97.7°F | Ht <= 58 in | Wt 112.2 lb

## 2012-06-01 DIAGNOSIS — E1129 Type 2 diabetes mellitus with other diabetic kidney complication: Secondary | ICD-10-CM | POA: Diagnosis not present

## 2012-06-01 DIAGNOSIS — N189 Chronic kidney disease, unspecified: Secondary | ICD-10-CM

## 2012-06-01 DIAGNOSIS — N186 End stage renal disease: Secondary | ICD-10-CM | POA: Diagnosis not present

## 2012-06-01 DIAGNOSIS — I10 Essential (primary) hypertension: Secondary | ICD-10-CM

## 2012-06-01 DIAGNOSIS — K219 Gastro-esophageal reflux disease without esophagitis: Secondary | ICD-10-CM

## 2012-06-01 DIAGNOSIS — D631 Anemia in chronic kidney disease: Secondary | ICD-10-CM

## 2012-06-01 DIAGNOSIS — N058 Unspecified nephritic syndrome with other morphologic changes: Secondary | ICD-10-CM | POA: Diagnosis not present

## 2012-06-01 DIAGNOSIS — E785 Hyperlipidemia, unspecified: Secondary | ICD-10-CM | POA: Insufficient documentation

## 2012-06-01 DIAGNOSIS — R011 Cardiac murmur, unspecified: Secondary | ICD-10-CM

## 2012-06-01 DIAGNOSIS — E1121 Type 2 diabetes mellitus with diabetic nephropathy: Secondary | ICD-10-CM

## 2012-06-01 DIAGNOSIS — R29810 Facial weakness: Secondary | ICD-10-CM

## 2012-06-01 DIAGNOSIS — Z992 Dependence on renal dialysis: Secondary | ICD-10-CM | POA: Insufficient documentation

## 2012-06-01 DIAGNOSIS — F329 Major depressive disorder, single episode, unspecified: Secondary | ICD-10-CM

## 2012-06-01 DIAGNOSIS — F39 Unspecified mood [affective] disorder: Secondary | ICD-10-CM | POA: Insufficient documentation

## 2012-06-01 DIAGNOSIS — Z8639 Personal history of other endocrine, nutritional and metabolic disease: Secondary | ICD-10-CM | POA: Insufficient documentation

## 2012-06-01 NOTE — Progress Notes (Signed)
Subjective:    Patient ID: [name], female    DOB: 10-Feb-1932, 79 y.o.   MRN: [id number]  HPI CC: new pt   New pt to establish, prior saw Dr. Vear Clock.  ESRD, HD dependent MWF for 3.5 hours.  Sees Dr. Caryn Section and Allena Katz at [place], dialysis at [facility].  Started 03/2011.  Doing well with this. Access - R brachiocephalic fistula.  Endorses depression - started 6 wks ago.  Has been talking to Child psychotherapist at kidney center.  More irritable, short fuse.    HTN - bp elevated today - states due to "white coat syndrome".  Brings log of blood pressure showing HR 50-60s, BP 80-140/50-70.  Recently diltiazem stopped.  Yesterday states had episode of dizziness, checked bp and 65/44.  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  Endorses persistent nasal sinus drainage/congestion, worse on R.  Medications and allergies reviewed and updated in chart.  Past histories reviewed and updated if relevant as below. Patient Active Problem List  Diagnosis  . RENAL DISEASE, CHRONIC, STAGE IV  . End stage renal disease  . Other complications due to renal dialysis device, implant, and graft   Past Medical History  Diagnosis Date  . Diabetes mellitus     resolved with weight loss  . Hyperlipidemia   . Hypertension   . Anxiety   . Facial droop 1935    acquired during forceps delivery, some residual R visual loss  . Anemia in chronic kidney disease     IV iron infusion  . Hx of breast cancer 2004  . Arthritis     mild in lower back  . End stage renal disease on dialysis     HD M,W,F [facility] [place]  . GERD (gastroesophageal reflux disease)    Past Surgical History  Procedure Laterality Date  . Cataract extraction  1981  . Eye surgery  1966  . Av fistula placement  07/20/10    Right brachiocephalic AVF  . Breast lumpectomy    . Breast biopsy    . Umbilical hernia repair    . Dexa  2013    [facility]   History  Substance Use Topics  .  Smoking status: Former Smoker    Types: Cigarettes    Quit date: 03/29/1953  . Smokeless tobacco: Never Used  . Alcohol Use: No     Comment: occasional wife   Family History  Problem Relation Age of Onset  . Heart disease Brother   . Diabetes Brother   . Cancer Father     stomach  . Cancer Sister     female (uterus?)  . CAD Father    Allergies  Allergen Reactions  . Sulfa Antibiotics Rash   Current Outpatient Prescriptions on File Prior to Visit  Medication Sig Dispense Refill  . amLODipine (NORVASC) 10 MG tablet Take 10 mg by mouth daily.        Marland Kitchen aspirin EC 81 MG tablet Take 81 mg by mouth daily.        . B Complex-C-Folic Acid (RENA-VITE PO) Take by mouth.      . Calcium Carbonate Antacid (TUMS ULTRA 1000 PO) Take 1,000 mg by mouth daily.      . Carboxymethylcellulose Sodium (REFRESH OP) Place 1 application into the left eye 2 (two) times daily as needed. For pain and dryness in left eye      . simvastatin (ZOCOR) 20 MG tablet Take 20 mg by mouth  at bedtime.         No current facility-administered medications on file prior to visit.   Review of Systems  Constitutional: Negative for fever, chills, activity change, appetite change, fatigue and unexpected weight change.  HENT: Negative for hearing loss and neck pain.   Eyes: Negative for visual disturbance.  Respiratory: Positive for shortness of breath (occasional). Negative for cough, chest tightness and wheezing.   Cardiovascular: Negative for chest pain, palpitations and leg swelling.  Gastrointestinal: Negative for nausea, vomiting, abdominal pain, diarrhea, constipation, blood in stool and abdominal distention.  Genitourinary: Negative for hematuria and difficulty urinating.  Musculoskeletal: Negative for myalgias and arthralgias.  Skin: Negative for rash.  Neurological: Negative for dizziness, seizures, syncope and headaches.  Hematological: Does not bruise/bleed easily.  Psychiatric/Behavioral: Negative for  dysphoric mood. The patient is not nervous/anxious.        Objective:   Physical Exam  Nursing note and vitals reviewed. Constitutional: She is oriented to person, place, and time. She appears well-developed and well-nourished. No distress.  HENT:  Head: Normocephalic and atraumatic.  Right Ear: Hearing, tympanic membrane, external ear and ear canal normal.  Left Ear: Hearing, tympanic membrane, external ear and ear canal normal.  Nose: Nose normal.  Mouth/Throat: Oropharynx is clear and moist. No oropharyngeal exudate.  Eyes: Conjunctivae and EOM are normal. Pupils are equal, round, and reactive to light. No scleral icterus.  Neck: Normal range of motion. Neck supple. Carotid bruit is not present. No thyromegaly present.  Cardiovascular: Normal rate, regular rhythm and intact distal pulses.   Murmur (4/6 SEM with radiation to carotids) heard. Pulses:      Radial pulses are 2+ on the right side, and 2+ on the left side.  Pulmonary/Chest: Effort normal and breath sounds normal. No respiratory distress. She has no wheezes. She has no rales.  Abdominal: Soft. Bowel sounds are normal. She exhibits no distension and no mass. There is no tenderness. There is no rebound and no guarding.  Musculoskeletal: Normal range of motion. She exhibits no edema.  Lymphadenopathy:    She has no cervical adenopathy.  Neurological: She is alert and oriented to person, place, and time.  R facial droop, chronic  Skin: Skin is warm and dry. No rash noted.  Psychiatric: She has a normal mood and affect. Her behavior is normal. Judgment and thought content normal.       Assessment & Plan:

## 2012-06-01 NOTE — Assessment & Plan Note (Signed)
Marked murmur today - await records. Anticipate aortic stenosis.   Consider echo, but may not be candidate for intervention given comorbidities.

## 2012-06-01 NOTE — Assessment & Plan Note (Signed)
Chronic, stable.  Residual R eye dryness.

## 2012-06-01 NOTE — Patient Instructions (Addendum)
Good to meet you today. We will request records from Dr. Patel/Fox and Dr. Vear Clock Return to see me in 3 months, prior as needed. May try nasal saline for sinus congestion. Let me know how mood is doing.

## 2012-06-01 NOTE — Assessment & Plan Note (Signed)
Endorses white coat hypertension, brings log of bp with stable range.  No changes today. Await records.

## 2012-06-01 NOTE — Assessment & Plan Note (Signed)
Compliant with simvastatin.  Continue med, await records.

## 2012-06-01 NOTE — Assessment & Plan Note (Signed)
requested records of last few office visits.  No changes today.  States great #s at recent lab work "14 gold stars"

## 2012-06-01 NOTE — Assessment & Plan Note (Signed)
Will await records

## 2012-06-01 NOTE — Assessment & Plan Note (Signed)
"  dialysis blues" Advised she f/u with social worker, to update me if desires further eval, or pharmacotherapy.

## 2012-06-01 NOTE — Assessment & Plan Note (Signed)
Await records of latest CBC

## 2012-06-07 DIAGNOSIS — E1129 Type 2 diabetes mellitus with other diabetic kidney complication: Secondary | ICD-10-CM | POA: Diagnosis not present

## 2012-06-07 DIAGNOSIS — N186 End stage renal disease: Secondary | ICD-10-CM | POA: Diagnosis not present

## 2012-06-19 ENCOUNTER — Encounter: Payer: Self-pay | Admitting: Family Medicine

## 2012-06-19 ENCOUNTER — Ambulatory Visit (INDEPENDENT_AMBULATORY_CARE_PROVIDER_SITE_OTHER): Payer: Medicare Other | Admitting: Family Medicine

## 2012-06-19 VITALS — BP 132/74 | HR 48 | Temp 97.5°F | Wt 111.0 lb

## 2012-06-19 DIAGNOSIS — F39 Unspecified mood [affective] disorder: Secondary | ICD-10-CM | POA: Diagnosis not present

## 2012-06-19 DIAGNOSIS — F329 Major depressive disorder, single episode, unspecified: Secondary | ICD-10-CM

## 2012-06-19 DIAGNOSIS — F3289 Other specified depressive episodes: Secondary | ICD-10-CM

## 2012-06-19 MED ORDER — PAROXETINE HCL 10 MG PO TABS: 10.0000 mg | ORAL_TABLET | ORAL | Status: DC

## 2012-06-19 NOTE — Assessment & Plan Note (Signed)
Anticipate multifactorial including significant natural grieving recently lost friend. Discussed grieving vs true mood disorder.  Does not qualify for major depression given short duration, but may have dysthymia. Discussed pharmacotherapy vs counseling - pt interested in both - will check with dialysis SW on names of counselors. Discussed pharmacotherapy not indicated for dysthymia or grieving.  Desires trial of SSRI regardless. Prior on paxil, tolerated and responded well. Restart today.  rtc 1-2 mo for f/u.

## 2012-06-19 NOTE — Patient Instructions (Addendum)
Start paxil 10mg  daily for mood. Talk with Jasmine December about setting up with a counselor. I think both these things will help more than either alone. I do think a component of this a normal grieving process. Return to see me in 1-2 months for follow up.

## 2012-06-19 NOTE — Progress Notes (Signed)
  Subjective:    Patient ID: [name], female    DOB: 03/17/1932, 79 y.o.   MRN: [id number]  HPI CC: discuss mood swings  "dialysis blues" - talked to Child psychotherapist - who thought pt was depressed.  Continued mood swings.  Interested in medicine for this.  Going on for last 2 months.  More irritable than in past.  Also with crying spells.  Is worried about her blood pressure dropping during dialysis.  A couple months ago also lost a good friend.  Does think some due to [place].  Appetite normal.  Sleep about the same - getting 5 hours/night - some insomnia.  Energy level normal.  Concentration ok.  Denies anhedonia - likes reading and visiting friends.  Feels some guilt - over getting mad at sister in law, feels guilty about not being near friends who have health issues.  No SI/HI.  Found to be bradycardic - to be referred to Dr. Gala Romney for further evaluation. At last visit I also heard heart murmur, never told had one in past.  Anticipated aortic stenosis.  Prior on Paxil and tolerated well.    Past Medical History  Diagnosis Date  . Type 2 diabetes with nephropathy     resolved with weight loss  . Hyperlipidemia   . Hypertension   . Anxiety   . Facial droop 1935    acquired during forceps delivery, some residual R visual loss  . Anemia in chronic kidney disease     IV iron infusion  . Hx of breast cancer 2004  . Arthritis     mild in lower back  . End stage renal disease on dialysis     HD M,W,F [facility]  . GERD (gastroesophageal reflux disease)     Review of Systems Per HPI    Objective:   Physical Exam  Nursing note and vitals reviewed. Constitutional: She appears well-developed. No distress.  Psychiatric: She has a normal mood and affect. Her speech is normal and behavior is normal. Judgment and thought content normal. Cognition and memory are normal.  Tears with discussion of mood       Assessment & Plan:

## 2012-06-22 DIAGNOSIS — Z1231 Encounter for screening mammogram for malignant neoplasm of breast: Secondary | ICD-10-CM | POA: Diagnosis not present

## 2012-06-23 ENCOUNTER — Encounter: Payer: Self-pay | Admitting: Family Medicine

## 2012-06-26 DIAGNOSIS — N186 End stage renal disease: Secondary | ICD-10-CM | POA: Diagnosis not present

## 2012-06-27 ENCOUNTER — Encounter: Payer: Self-pay | Admitting: Family Medicine

## 2012-06-27 ENCOUNTER — Encounter: Payer: Self-pay | Admitting: *Deleted

## 2012-06-27 HISTORY — PX: US ECHOCARDIOGRAPHY: HXRAD669

## 2012-06-28 DIAGNOSIS — N186 End stage renal disease: Secondary | ICD-10-CM | POA: Diagnosis not present

## 2012-06-28 DIAGNOSIS — E1129 Type 2 diabetes mellitus with other diabetic kidney complication: Secondary | ICD-10-CM | POA: Diagnosis not present

## 2012-06-28 DIAGNOSIS — D509 Iron deficiency anemia, unspecified: Secondary | ICD-10-CM | POA: Diagnosis not present

## 2012-06-28 DIAGNOSIS — D631 Anemia in chronic kidney disease: Secondary | ICD-10-CM | POA: Diagnosis not present

## 2012-07-06 ENCOUNTER — Ambulatory Visit: Payer: Medicare Other | Admitting: Family Medicine

## 2012-07-12 LAB — COMPREHENSIVE METABOLIC PANEL
BUN: 58 mg/dL — AB (ref 4–21)
Potassium: 5.1 mmol/L
Sodium: 142 mmol/L (ref 137–147)

## 2012-07-18 ENCOUNTER — Encounter: Payer: Self-pay | Admitting: Family Medicine

## 2012-07-18 ENCOUNTER — Ambulatory Visit (INDEPENDENT_AMBULATORY_CARE_PROVIDER_SITE_OTHER): Payer: Medicare Other | Admitting: Family Medicine

## 2012-07-18 VITALS — BP 158/70 | HR 60 | Temp 97.7°F | Ht 59.0 in | Wt 112.0 lb

## 2012-07-18 DIAGNOSIS — F39 Unspecified mood [affective] disorder: Secondary | ICD-10-CM

## 2012-07-18 NOTE — Assessment & Plan Note (Signed)
Anticipate dysthymia and normal grieving. Overall doing well with counseling and paxil 10mg  daily. Continue med, will reassess in 6 months.

## 2012-07-18 NOTE — Patient Instructions (Signed)
You are doing well today. No changes. Let's continue paxil 10mg  daily for next 6 months then reassess. Return to see me in 6 months for follow up.

## 2012-07-18 NOTE — Progress Notes (Signed)
  Subjective:    Patient ID: [name], female    DOB: 05-Aug-1931, 79 y.o.   MRN: [id number]  HPI CC: 1 mo f/u  See prior note for details. In brief, pt presented last month with worsening mood issues - thought grieving loss of friend as well as dysthymia. Recommended counseling - pt spoke with dialysis social worker for names of counselors. Pt desired trial of paxil - so paroxetine 10mg  daily was started.  Presents today as 33mo f/u after starting medication. "things going much better".  Able to cope with things better.   Started seeing counselor twice weekly for next few weeks temporarily - Dr. Claudette Laws in [place].  Pt to see Dr. Jens Som next Tuesday.  Past Medical History  Diagnosis Date  . Type 2 diabetes with nephropathy     resolved with weight loss  . Hyperlipidemia   . Hypertension   . Anxiety   . Facial droop 1935    acquired during forceps delivery, some residual R visual loss  . Anemia in chronic kidney disease     IV iron infusion  . Hx of breast cancer 2004  . Arthritis     mild in lower back  . End stage renal disease on dialysis     HD M,W,F [facility]  . GERD (gastroesophageal reflux disease)      Review of Systems Per HPI    Objective:   Physical Exam Less tearful today. brighter affect.    Assessment & Plan:

## 2012-07-24 ENCOUNTER — Encounter (HOSPITAL_COMMUNITY): Payer: Self-pay | Admitting: Emergency Medicine

## 2012-07-24 ENCOUNTER — Emergency Department (HOSPITAL_COMMUNITY)
Admission: EM | Admit: 2012-07-24 | Discharge: 2012-07-24 | Discharge status: Against Medical Advice | Payer: Medicare Other | Attending: Emergency Medicine | Admitting: Emergency Medicine

## 2012-07-24 DIAGNOSIS — R6883 Chills (without fever): Secondary | ICD-10-CM | POA: Insufficient documentation

## 2012-07-24 DIAGNOSIS — R209 Unspecified disturbances of skin sensation: Secondary | ICD-10-CM | POA: Insufficient documentation

## 2012-07-24 DIAGNOSIS — Z992 Dependence on renal dialysis: Secondary | ICD-10-CM | POA: Insufficient documentation

## 2012-07-24 LAB — CBC
MCH: 32.3 pg (ref 26.0–34.0)
MCV: 93.6 fL (ref 78.0–100.0)
Platelets: 201 10*3/uL (ref 150–400)
RBC: 3.59 MIL/uL — ABNORMAL LOW (ref 3.87–5.11)
RDW: 14.5 % (ref 11.5–15.5)

## 2012-07-24 LAB — BASIC METABOLIC PANEL
CO2: 31 mEq/L (ref 19–32)
Calcium: 9.4 mg/dL (ref 8.4–10.5)
Creatinine, Ser: 3.59 mg/dL — ABNORMAL HIGH (ref 0.50–1.10)
GFR calc Af Amer: 13 mL/min — ABNORMAL LOW (ref >90)
GFR calc non Af Amer: 11 mL/min — ABNORMAL LOW (ref >90)
Sodium: 139 mEq/L (ref 135–145)

## 2012-07-24 LAB — POCT I-STAT TROPONIN I: Troponin i, poc: 0.03 ng/mL (ref 0.00–0.08)

## 2012-07-24 NOTE — ED Notes (Signed)
Called x3, no answer. Patient not in radiology or restroom

## 2012-07-24 NOTE — ED Notes (Signed)
Pt c/o chills starting while in dialysis; pt sts recent hx of hypotension and bradycardia at times; pt denies at present; pt sts some intermittent numbness in right hand x 1 month

## 2012-07-24 NOTE — ED Notes (Signed)
Called x2, no answer 

## 2012-07-25 ENCOUNTER — Other Ambulatory Visit: Payer: Self-pay | Admitting: *Deleted

## 2012-07-25 ENCOUNTER — Ambulatory Visit (INDEPENDENT_AMBULATORY_CARE_PROVIDER_SITE_OTHER): Payer: Medicare Other | Admitting: Cardiology

## 2012-07-25 ENCOUNTER — Encounter: Payer: Self-pay | Admitting: Cardiology

## 2012-07-25 ENCOUNTER — Telehealth: Payer: Self-pay | Admitting: *Deleted

## 2012-07-25 VITALS — BP 156/60 | HR 74 | Ht 59.0 in | Wt 113.1 lb

## 2012-07-25 DIAGNOSIS — R011 Cardiac murmur, unspecified: Secondary | ICD-10-CM | POA: Diagnosis not present

## 2012-07-25 DIAGNOSIS — I498 Other specified cardiac arrhythmias: Secondary | ICD-10-CM

## 2012-07-25 DIAGNOSIS — I10 Essential (primary) hypertension: Secondary | ICD-10-CM

## 2012-07-25 DIAGNOSIS — R9431 Abnormal electrocardiogram [ECG] [EKG]: Secondary | ICD-10-CM | POA: Diagnosis not present

## 2012-07-25 DIAGNOSIS — R001 Bradycardia, unspecified: Secondary | ICD-10-CM | POA: Insufficient documentation

## 2012-07-25 NOTE — Patient Instructions (Signed)
Your physician recommends that you schedule a follow-up appointment in: 8 WEEKS WITH DR Jens Som  Your physician has requested that you have en exercise stress myoview. For further information please visit [email or web address] Please follow instruction sheet, as given.   Your physician has requested that you have an echocardiogram. Echocardiography is a painless test that uses sound waves to create images of your heart. It provides your doctor with information about the size and shape of your heart and how well your heart's chambers and valves are working. This procedure takes approximately one hour. There are no restrictions for this procedure.   Your physician has recommended that you wear a 48 HOUR holter monitor. Holter monitors are medical devices that record the heart's electrical activity. Doctors most often use these monitors to diagnose arrhythmias. Arrhythmias are problems with the speed or rhythm of the heartbeat. The monitor is a small, portable device. You can wear one while you do your normal daily activities. This is usually used to diagnose what is causing palpitations/syncope (passing out).  DO NOT TAKE AMLODIPINE THE NIGHT PRIOR TO DIALYSIS

## 2012-07-25 NOTE — Assessment & Plan Note (Signed)
Patient is having difficulties with hypotension on dialysis days. I have asked her not to take her Norvasc but not before her dialysis days to see if this improves.

## 2012-07-25 NOTE — Telephone Encounter (Signed)
48 hour holter moniter placed on Pt 07/25/12 TK

## 2012-07-25 NOTE — Assessment & Plan Note (Signed)
Given history of diabetes mellitus and inferior infarct on electrocardiogram plan stress Myoview.

## 2012-07-25 NOTE — Progress Notes (Signed)
HPI: 79 year old female for evaluation of bradycardia, murmur and hypotension. She is dialysis dependent. The patient has noticed that she has hypotension predominantly on dialysis days. She takes her Norvasc at night. She otherwise does not have dyspnea on exertion, orthopnea, PND, pedal edema, palpitations or syncope. She does occasionally have transient dizziness for one to 2 seconds. She has been noted to have heart rates in the 30s on her blood pressure machine. Because of the above cardiology is asked to evaluate.  Current Outpatient Prescriptions  Medication Sig Dispense Refill  . amLODipine (NORVASC) 10 MG tablet Take 5 mg by mouth daily.       Marland Kitchen aspirin EC 81 MG tablet Take 81 mg by mouth daily.        . B Complex-C-Folic Acid (RENA-VITE PO) Take by mouth.      . Calcium Carbonate Antacid (TUMS ULTRA 1000 PO) Take 1,000 mg by mouth daily.      . Carboxymethylcellulose Sodium (REFRESH OP) Place 1 application into the left eye 2 (two) times daily as needed. For pain and dryness in left eye      . ibuprofen (ADVIL,MOTRIN) 200 MG tablet Take 200 mg by mouth every 6 (six) hours as needed for pain.      Marland Kitchen PARoxetine (PAXIL) 10 MG tablet Take 1 tablet (10 mg total) by mouth every morning.  30 tablet  6  . simvastatin (ZOCOR) 20 MG tablet Take 20 mg by mouth at bedtime.         No current facility-administered medications for this visit.    Allergies  Allergen Reactions  . Sulfa Antibiotics Rash    Past Medical History  Diagnosis Date  . Type 2 diabetes with nephropathy     resolved with weight loss  . Hyperlipidemia   . Hypertension   . Anxiety   . Facial droop 1935    acquired during forceps delivery, some residual R visual loss  . Anemia in chronic kidney disease     IV iron infusion  . Hx of breast cancer 2004  . Arthritis     mild in lower back  . End stage renal disease on dialysis     HD M,W,F [facility]  . GERD (gastroesophageal reflux disease)      Past Surgical History  Procedure Laterality Date  . Cataract extraction  1981  . Eye surgery  1966  . Av fistula placement  07/20/10    Right brachiocephalic AVF  . Breast lumpectomy    . Breast biopsy    . Umbilical hernia repair    . Dexa  2013    [facility]    History   Social History  . Marital Status: Single    Spouse Name: N/A    Number of Children: N/A  . Years of Education: N/A   Occupational History  . Not on file.   Social History Main Topics  . Smoking status: Former Smoker    Types: Cigarettes    Quit date: 03/29/1953  . Smokeless tobacco: Former Neurosurgeon  . Alcohol Use: Yes     Comment: occasional  . Drug Use: No  . Sexually Active: Not on file   Other Topics Concern  . Not on file   Social History Narrative   Lives with brother Louanne Skye) and sister in law (June)   Occupation: retired, was Therapist, nutritional for [employer]   Edu: some college    Family History  Problem Relation Age of Onset  .  Heart disease Brother     Congenital  . Diabetes Brother   . Cancer Father     stomach  . Cancer Sister     female (uterus?)  . CAD Father     MI at age 59    ROS: no fevers or chills, productive cough, hemoptysis, dysphasia, odynophagia, melena, hematochezia, dysuria, hematuria, rash, seizure activity, orthopnea, PND, pedal edema, claudication. Remaining systems are negative.  Physical Exam:   Blood pressure 156/60, pulse 74, height 4\' 11"  (1.499 m), weight 113 lb 1.9 oz (51.311 kg).  General:  Well developed/well nourished in NAD Skin warm/dry Patient not depressed No peripheral clubbing Back-normal HEENT-right facial droop Neck supple/normal carotid upstroke bilaterally; no bruits; no JVD; no thyromegaly chest - CTA/ normal expansion CV - RRR/normal S1 and S2; no rubs or gallops;  PMI nondisplaced, 3/6 continuous murmur possibly related to AV fistula in right upper extremity. Abdomen -NT/ND, no HSM, no mass, + bowel sounds, no bruit 2+  femoral pulses, no bruits Ext-no edema, no chords, 2+ DP, AV fistula right upper extremity. Neuro-grossly nonfocal  ECG 07/24/2012-ectopic atrial bradycardia, prior anterior and inferior infarct cannot be excluded.  Electrocardiogram today shows sinus rhythm at a rate of 74. Prior inferior infarct.

## 2012-07-25 NOTE — Assessment & Plan Note (Signed)
Patient has been noted to have bradycardia with dialysis and with checking her blood pressure at home. Schedule 48 hour Holter monitor to further assess.

## 2012-07-25 NOTE — Assessment & Plan Note (Addendum)
Schedule echocardiogram. Murmur may be from fistula.

## 2012-07-26 DIAGNOSIS — N186 End stage renal disease: Secondary | ICD-10-CM | POA: Diagnosis not present

## 2012-07-27 ENCOUNTER — Ambulatory Visit (HOSPITAL_COMMUNITY): Payer: Medicare Other | Attending: Cardiology | Admitting: Radiology

## 2012-07-27 DIAGNOSIS — I120 Hypertensive chronic kidney disease with stage 5 chronic kidney disease or end stage renal disease: Secondary | ICD-10-CM | POA: Insufficient documentation

## 2012-07-27 DIAGNOSIS — R011 Cardiac murmur, unspecified: Secondary | ICD-10-CM | POA: Diagnosis not present

## 2012-07-27 DIAGNOSIS — E785 Hyperlipidemia, unspecified: Secondary | ICD-10-CM | POA: Diagnosis not present

## 2012-07-27 DIAGNOSIS — Z992 Dependence on renal dialysis: Secondary | ICD-10-CM | POA: Insufficient documentation

## 2012-07-27 DIAGNOSIS — I498 Other specified cardiac arrhythmias: Secondary | ICD-10-CM | POA: Diagnosis not present

## 2012-07-27 DIAGNOSIS — N186 End stage renal disease: Secondary | ICD-10-CM | POA: Diagnosis not present

## 2012-07-27 DIAGNOSIS — E119 Type 2 diabetes mellitus without complications: Secondary | ICD-10-CM | POA: Insufficient documentation

## 2012-07-27 DIAGNOSIS — Z87891 Personal history of nicotine dependence: Secondary | ICD-10-CM | POA: Diagnosis not present

## 2012-07-27 DIAGNOSIS — Z853 Personal history of malignant neoplasm of breast: Secondary | ICD-10-CM | POA: Diagnosis not present

## 2012-07-27 NOTE — Progress Notes (Signed)
Echocardiogram performed.  

## 2012-07-28 ENCOUNTER — Other Ambulatory Visit: Payer: Self-pay | Admitting: *Deleted

## 2012-07-28 DIAGNOSIS — D631 Anemia in chronic kidney disease: Secondary | ICD-10-CM | POA: Diagnosis not present

## 2012-07-28 DIAGNOSIS — E1129 Type 2 diabetes mellitus with other diabetic kidney complication: Secondary | ICD-10-CM | POA: Diagnosis not present

## 2012-07-28 DIAGNOSIS — B9789 Other viral agents as the cause of diseases classified elsewhere: Secondary | ICD-10-CM | POA: Diagnosis not present

## 2012-07-28 DIAGNOSIS — N186 End stage renal disease: Secondary | ICD-10-CM | POA: Diagnosis not present

## 2012-07-28 DIAGNOSIS — D509 Iron deficiency anemia, unspecified: Secondary | ICD-10-CM | POA: Diagnosis not present

## 2012-07-28 DIAGNOSIS — N039 Chronic nephritic syndrome with unspecified morphologic changes: Secondary | ICD-10-CM | POA: Diagnosis not present

## 2012-07-28 MED ORDER — SIMVASTATIN 20 MG PO TABS: 20.0000 mg | ORAL_TABLET | Freq: Every day | ORAL | Status: DC

## 2012-07-30 ENCOUNTER — Encounter: Payer: Self-pay | Admitting: Family Medicine

## 2012-07-31 ENCOUNTER — Telehealth: Payer: Self-pay | Admitting: Cardiology

## 2012-07-31 DIAGNOSIS — B9789 Other viral agents as the cause of diseases classified elsewhere: Secondary | ICD-10-CM | POA: Diagnosis not present

## 2012-07-31 DIAGNOSIS — N186 End stage renal disease: Secondary | ICD-10-CM | POA: Diagnosis not present

## 2012-07-31 DIAGNOSIS — E1129 Type 2 diabetes mellitus with other diabetic kidney complication: Secondary | ICD-10-CM | POA: Diagnosis not present

## 2012-07-31 DIAGNOSIS — D631 Anemia in chronic kidney disease: Secondary | ICD-10-CM | POA: Diagnosis not present

## 2012-07-31 DIAGNOSIS — D509 Iron deficiency anemia, unspecified: Secondary | ICD-10-CM | POA: Diagnosis not present

## 2012-07-31 NOTE — Telephone Encounter (Signed)
New problem    Per pt she had problems while at her dialysis appt today and wants to know if she should come to her appt tomorrow

## 2012-07-31 NOTE — Telephone Encounter (Signed)
Spoke with pt, today at dialysis her bp got very low. She developed leg cramps and shaking. She was very cold and reports they were unable to get her temp. She is home now and feels much better after lying down. She is scheduled for a stress myoview tomorrow. Explained to the pt we can do whatever she wants depending on how she is feeling. The pt kept the appt and will call in the morning if she is not feeling well. Nuclear medicine made aware.

## 2012-08-01 ENCOUNTER — Ambulatory Visit (HOSPITAL_COMMUNITY): Payer: Medicare Other | Attending: Cardiology | Admitting: Radiology

## 2012-08-01 ENCOUNTER — Encounter: Payer: Self-pay | Admitting: *Deleted

## 2012-08-01 ENCOUNTER — Telehealth: Payer: Self-pay | Admitting: *Deleted

## 2012-08-01 VITALS — BP 142/72 | Ht 59.0 in | Wt 113.0 lb

## 2012-08-01 DIAGNOSIS — I498 Other specified cardiac arrhythmias: Secondary | ICD-10-CM | POA: Insufficient documentation

## 2012-08-01 DIAGNOSIS — R011 Cardiac murmur, unspecified: Secondary | ICD-10-CM | POA: Diagnosis not present

## 2012-08-01 DIAGNOSIS — R002 Palpitations: Secondary | ICD-10-CM | POA: Diagnosis not present

## 2012-08-01 DIAGNOSIS — R9431 Abnormal electrocardiogram [ECG] [EKG]: Secondary | ICD-10-CM | POA: Diagnosis not present

## 2012-08-01 DIAGNOSIS — Z8249 Family history of ischemic heart disease and other diseases of the circulatory system: Secondary | ICD-10-CM | POA: Diagnosis not present

## 2012-08-01 DIAGNOSIS — R29810 Facial weakness: Secondary | ICD-10-CM | POA: Diagnosis not present

## 2012-08-01 DIAGNOSIS — E785 Hyperlipidemia, unspecified: Secondary | ICD-10-CM | POA: Insufficient documentation

## 2012-08-01 DIAGNOSIS — I10 Essential (primary) hypertension: Secondary | ICD-10-CM | POA: Diagnosis not present

## 2012-08-01 DIAGNOSIS — R42 Dizziness and giddiness: Secondary | ICD-10-CM | POA: Diagnosis not present

## 2012-08-01 DIAGNOSIS — Z87891 Personal history of nicotine dependence: Secondary | ICD-10-CM | POA: Insufficient documentation

## 2012-08-01 MED ORDER — TECHNETIUM TC 99M SESTAMIBI GENERIC - CARDIOLITE
11.0000 | Freq: Once | INTRAVENOUS | Status: AC | PRN
  Administered 2012-08-01: 11 via INTRAVENOUS

## 2012-08-01 MED ORDER — REGADENOSON 0.4 MG/5ML IV SOLN
0.4000 mg | Freq: Once | INTRAVENOUS | Status: AC
  Administered 2012-08-01: 0.4 mg via INTRAVENOUS

## 2012-08-01 MED ORDER — TECHNETIUM TC 99M SESTAMIBI GENERIC - CARDIOLITE
33.0000 | Freq: Once | INTRAVENOUS | Status: AC | PRN
  Administered 2012-08-01: 33 via INTRAVENOUS

## 2012-08-01 NOTE — Telephone Encounter (Addendum)
Pt here nuclear test and ask to speak to me. When she does not take her norvasc prior to dialysis her bp is above 200 when she gets there. She also reports yesterday after dialysis was over her bp was very low and she began to have chills with shaking. They kept telling her she had a blood infection. The dialysis place did blood cultures and other blood work. Will forward for dr Jens Som review

## 2012-08-01 NOTE — Progress Notes (Signed)
  [facility] SITE 3 NUCLEAR MED [address] [place], [place] [address] [phone]    Cardiology Nuclear Med Study  [name] is a 79 y.o. female     MRN : [id number]     DOB: 02/25/1932  Procedure Date: 08/01/2012  Nuclear Med Background Indication for Stress Test:  Evaluation for Ischemia and Abnormal EKG:Inferior infarct History:  Dialysis, Bradycardia(per her machine) 3/6 murmur chronic facial droop Cardiac Risk Factors: Family History - CAD, History of Smoking, Hypertension and Lipids  Symptoms:  Dizziness and Palpitations   Nuclear Pre-Procedure Caffeine/Decaff Intake:  None NPO After: 7:30am   Lungs:  clear O2 Sat: 96% on room air. IV 0.9% NS with Angio Cath:  22g  IV Site: L Antecubital  IV Started by:  Irean Hong, RN  Chest Size (in):  36 Cup Size: B  Height: 4\' 11"  (1.499 m)  Weight:  113 lb (51.256 kg)  BMI:  Body mass index is 22.81 kg/(m^2). Tech Comments:  n/a    Nuclear Med Study 1 or 2 day study: 1 day  Stress Test Type:  Treadmill/Lexiscan  Reading MD: Charlton Haws, MD  Order Authorizing Provider:  Ripley Fraise  Resting Radionuclide: Technetium 78m Sestamibi  Resting Radionuclide Dose: 11.0 mCi   Stress Radionuclide:  Technetium 57m Sestamibi  Stress Radionuclide Dose: 33.0 mCi           Stress Protocol Rest HR: 64 Stress HR: 108  Rest BP: 142/72 Stress BP: 197/71  Exercise Time (min): 9:01 METS: 7.10   Predicted Max HR: 142 bpm % Max HR: 76.06 bpm Rate Pressure Product: 21308   Dose of Adenosine (mg):  n/a Dose of Lexiscan: 0.4 mg  Dose of Atropine (mg): n/a Dose of Dobutamine: n/a mcg/kg/min (at max HR)  Stress Test Technologist: Milana Na, EMT-P  Nuclear Technologist:  Domenic Polite, CNMT     Rest Procedure:  Myocardial perfusion imaging was performed at rest 45 minutes following the intravenous administration of Technetium 79m Sestamibi. Rest ECG: SR poor R wave progression  Stress Procedure:  The patient  received IV Lexiscan 0.4 mg over 15-seconds with concurrent low level exercise and then Technetium 2m Sestamibi was injected at 30-seconds while the patient continued walking one more minute. This patient was sob with the Lexiscan injection. Quantitative spect images were obtained after a 45-minute delay. Stress ECG: PVC;s and lateral ST segment depression in recovery  QPS Raw Data Images:  Normal; no motion artifact; normal heart/lung ratio. Stress Images:  Normal homogeneous uptake in all areas of the myocardium. Rest Images:  Normal homogeneous uptake in all areas of the myocardium. Subtraction (SDS):  Normal Transient Ischemic Dilatation (Normal <1.22):  0.71 Lung/Heart Ratio (Normal <0.45):  0.22  Quantitative Gated Spect Images QGS EDV:  55 ml QGS ESV:  14 ml  Impression Exercise Capacity:  Fair exercise capacity. BP Response:  Normal blood pressure response. Clinical Symptoms:  There is dyspnea. ECG Impression:  inferolateral ST segment depression in recovery  Comparison with Prior Nuclear Study: No images to compare  Overall Impression:  Low risk stress nuclear study Dyspnea with somewhat prominant RV on nuclear images Positive ECG in recovery.  LV Ejection Fraction: 75%.  LV Wall Motion:  NL LV Function; NL Wall Motion  Charlton Haws

## 2012-08-02 ENCOUNTER — Encounter: Payer: Self-pay | Admitting: Family Medicine

## 2012-08-02 DIAGNOSIS — D631 Anemia in chronic kidney disease: Secondary | ICD-10-CM | POA: Diagnosis not present

## 2012-08-02 DIAGNOSIS — N186 End stage renal disease: Secondary | ICD-10-CM | POA: Diagnosis not present

## 2012-08-02 DIAGNOSIS — N039 Chronic nephritic syndrome with unspecified morphologic changes: Secondary | ICD-10-CM | POA: Diagnosis not present

## 2012-08-02 DIAGNOSIS — D509 Iron deficiency anemia, unspecified: Secondary | ICD-10-CM | POA: Diagnosis not present

## 2012-08-02 DIAGNOSIS — E1129 Type 2 diabetes mellitus with other diabetic kidney complication: Secondary | ICD-10-CM | POA: Diagnosis not present

## 2012-08-02 DIAGNOSIS — B9789 Other viral agents as the cause of diseases classified elsewhere: Secondary | ICD-10-CM | POA: Diagnosis not present

## 2012-08-02 NOTE — Telephone Encounter (Signed)
Take norvasc in AM instead of PM; do not take on dialysis days. Will allow BP to run higher prior to dialysis to hopefully avoid hypotension following dialysis. Chills/infection should be discussed with nephrology. Olga Millers

## 2012-08-03 NOTE — Telephone Encounter (Signed)
Follow Up      Pt calling in returning phone call from a few days ago regarding results. Please calll back.

## 2012-08-03 NOTE — Telephone Encounter (Signed)
Left message of dr crenshaw's recommendations. 

## 2012-08-03 NOTE — Telephone Encounter (Signed)
Left message of nuclear results for pt 

## 2012-08-04 DIAGNOSIS — N186 End stage renal disease: Secondary | ICD-10-CM | POA: Diagnosis not present

## 2012-08-04 DIAGNOSIS — N039 Chronic nephritic syndrome with unspecified morphologic changes: Secondary | ICD-10-CM | POA: Diagnosis not present

## 2012-08-04 DIAGNOSIS — E1129 Type 2 diabetes mellitus with other diabetic kidney complication: Secondary | ICD-10-CM | POA: Diagnosis not present

## 2012-08-04 DIAGNOSIS — B9789 Other viral agents as the cause of diseases classified elsewhere: Secondary | ICD-10-CM | POA: Diagnosis not present

## 2012-08-04 DIAGNOSIS — D509 Iron deficiency anemia, unspecified: Secondary | ICD-10-CM | POA: Diagnosis not present

## 2012-08-07 ENCOUNTER — Encounter (HOSPITAL_COMMUNITY): Payer: Self-pay | Admitting: *Deleted

## 2012-08-07 ENCOUNTER — Emergency Department (HOSPITAL_COMMUNITY)
Admission: EM | Admit: 2012-08-07 | Discharge: 2012-08-07 | Disposition: A | Discharge status: Home / Self Care | Payer: Medicare Other | Attending: Emergency Medicine | Admitting: Emergency Medicine

## 2012-08-07 DIAGNOSIS — I498 Other specified cardiac arrhythmias: Secondary | ICD-10-CM | POA: Diagnosis not present

## 2012-08-07 DIAGNOSIS — I120 Hypertensive chronic kidney disease with stage 5 chronic kidney disease or end stage renal disease: Secondary | ICD-10-CM | POA: Insufficient documentation

## 2012-08-07 DIAGNOSIS — Z8719 Personal history of other diseases of the digestive system: Secondary | ICD-10-CM | POA: Diagnosis not present

## 2012-08-07 DIAGNOSIS — D631 Anemia in chronic kidney disease: Secondary | ICD-10-CM | POA: Diagnosis not present

## 2012-08-07 DIAGNOSIS — R6889 Other general symptoms and signs: Secondary | ICD-10-CM | POA: Diagnosis not present

## 2012-08-07 DIAGNOSIS — Z79899 Other long term (current) drug therapy: Secondary | ICD-10-CM | POA: Diagnosis not present

## 2012-08-07 DIAGNOSIS — Z862 Personal history of diseases of the blood and blood-forming organs and certain disorders involving the immune mechanism: Secondary | ICD-10-CM | POA: Insufficient documentation

## 2012-08-07 DIAGNOSIS — Z992 Dependence on renal dialysis: Secondary | ICD-10-CM | POA: Insufficient documentation

## 2012-08-07 DIAGNOSIS — E875 Hyperkalemia: Secondary | ICD-10-CM | POA: Insufficient documentation

## 2012-08-07 DIAGNOSIS — N186 End stage renal disease: Secondary | ICD-10-CM | POA: Diagnosis not present

## 2012-08-07 DIAGNOSIS — I959 Hypotension, unspecified: Secondary | ICD-10-CM | POA: Insufficient documentation

## 2012-08-07 DIAGNOSIS — R002 Palpitations: Secondary | ICD-10-CM | POA: Insufficient documentation

## 2012-08-07 DIAGNOSIS — N058 Unspecified nephritic syndrome with other morphologic changes: Secondary | ICD-10-CM | POA: Diagnosis not present

## 2012-08-07 DIAGNOSIS — E1129 Type 2 diabetes mellitus with other diabetic kidney complication: Secondary | ICD-10-CM | POA: Insufficient documentation

## 2012-08-07 DIAGNOSIS — Z87891 Personal history of nicotine dependence: Secondary | ICD-10-CM | POA: Insufficient documentation

## 2012-08-07 DIAGNOSIS — Z7982 Long term (current) use of aspirin: Secondary | ICD-10-CM | POA: Diagnosis not present

## 2012-08-07 DIAGNOSIS — Z853 Personal history of malignant neoplasm of breast: Secondary | ICD-10-CM | POA: Insufficient documentation

## 2012-08-07 DIAGNOSIS — Z8659 Personal history of other mental and behavioral disorders: Secondary | ICD-10-CM | POA: Diagnosis not present

## 2012-08-07 DIAGNOSIS — R001 Bradycardia, unspecified: Secondary | ICD-10-CM

## 2012-08-07 DIAGNOSIS — B9789 Other viral agents as the cause of diseases classified elsewhere: Secondary | ICD-10-CM | POA: Diagnosis not present

## 2012-08-07 DIAGNOSIS — D509 Iron deficiency anemia, unspecified: Secondary | ICD-10-CM | POA: Diagnosis not present

## 2012-08-07 DIAGNOSIS — I10 Essential (primary) hypertension: Secondary | ICD-10-CM | POA: Diagnosis not present

## 2012-08-07 HISTORY — DX: Hypotension, unspecified: I95.9

## 2012-08-07 HISTORY — DX: Bradycardia, unspecified: R00.1

## 2012-08-07 LAB — CBC WITH DIFFERENTIAL/PLATELET
Basophils Absolute: 0 10*3/uL (ref 0.0–0.1)
Basophils Relative: 1 % (ref 0–1)
HCT: 34.7 % — ABNORMAL LOW (ref 36.0–46.0)
Hemoglobin: 11.4 g/dL — ABNORMAL LOW (ref 12.0–15.0)
Lymphocytes Relative: 29 % (ref 12–46)
Monocytes Absolute: 0.8 10*3/uL (ref 0.1–1.0)
Neutro Abs: 4.5 10*3/uL (ref 1.7–7.7)
Neutrophils Relative %: 57 % (ref 43–77)
RDW: 14.9 % (ref 11.5–15.5)
WBC: 7.9 10*3/uL (ref 4.0–10.5)

## 2012-08-07 LAB — BASIC METABOLIC PANEL
CO2: 23 mEq/L (ref 19–32)
Chloride: 96 mEq/L (ref 96–112)
Creatinine, Ser: 8.8 mg/dL — ABNORMAL HIGH (ref 0.50–1.10)
GFR calc Af Amer: 4 mL/min — ABNORMAL LOW (ref >90)
Potassium: 6 mEq/L — ABNORMAL HIGH (ref 3.5–5.1)
Sodium: 136 mEq/L (ref 135–145)

## 2012-08-07 LAB — POCT I-STAT TROPONIN I: Troponin i, poc: 0.02 ng/mL (ref 0.00–0.08)

## 2012-08-07 LAB — TSH: TSH: 2.016 u[IU]/mL (ref 0.350–4.500)

## 2012-08-07 LAB — T4, FREE: Free T4: 0.91 ng/dL (ref 0.80–1.80)

## 2012-08-07 NOTE — ED Provider Notes (Signed)
History     CSN: [id number]  Arrival date & time 08/07/12  5784   First MD Initiated Contact with Patient 08/07/12 0848      Chief Complaint  Patient presents with  . Bradycardia    (Consider location/radiation/quality/duration/timing/severity/associated sxs/prior treatment) The history is provided by the patient.  [name] is a 79 y.o. female history of diabetes, hypertension, ESRD on dialysis (M, W, F last dialyzed Fri) here presenting with bradycardia. Woke up this morning and was feeling fine. Was at dialysis center this morning and was noted to have bradycardia to the 30s and 40s. Denies syncope or chest pain or shortness of breath. Had similar episodes 2 weeks ago and had a Holter monitor but she didn't know the result. She also had recent nuclear stress test that did not show any ischemia.    Past Medical History  Diagnosis Date  . Type 2 diabetes with nephropathy     resolved with weight loss  . Hyperlipidemia   . Hypertension   . Anxiety   . Facial droop 1935    acquired during forceps delivery, some residual R visual loss  . Anemia in chronic kidney disease     IV iron infusion  . Hx of breast cancer 2004  . Arthritis     mild in lower back  . End stage renal disease on dialysis     HD M,W,F [facility]  . GERD (gastroesophageal reflux disease)   . Cardiac murmur     a. thought due to AVF (2D echo 06/2012 without significant valvular disease).   . Bradycardia     a. Noted 06/2012.  Marland Kitchen Hypotension     a. Associated w/ dialysis.    Past Surgical History  Procedure Laterality Date  . Cataract extraction  1981  . Eye surgery  1966  . Av fistula placement  07/20/10    Right brachiocephalic AVF  . Breast lumpectomy    . Breast biopsy    . Umbilical hernia repair    . Dexa  2013    [facility]  . US echocardiography  06/2012    LVH, EF 65%, grade 1 diastol dysfunction, mod dliated LAD    Family History  Problem Relation Age of Onset  . Heart  disease Brother     Congenital  . Diabetes Brother   . Cancer Father     stomach  . Cancer Sister     female (uterus?)  . CAD Father     MI at age 78    History  Substance Use Topics  . Smoking status: Former Smoker    Types: Cigarettes    Quit date: 03/29/1953  . Smokeless tobacco: Former Neurosurgeon  . Alcohol Use: Yes     Comment: occasional    OB History   Grav Para Term Preterm Abortions TAB SAB Ect Mult Living                  Review of Systems  Cardiovascular:       Bradycardia   All other systems reviewed and are negative.    Allergies  Sulfa antibiotics  Home Medications   Current Outpatient Rx  Name  Route  Sig  Dispense  Refill  . amLODipine (NORVASC) 5 MG tablet   Oral   Take 5 mg by mouth as directed. Daily except do not take on dialysis days.         Marland Kitchen aspirin EC 81 MG tablet   Oral  Take 81 mg by mouth at bedtime.          . B Complex-C-Folic Acid (RENA-VITE PO)   Oral   Take 1 tablet by mouth daily with breakfast.          . Calcium Carbonate Antacid (TUMS ULTRA 1000 PO)   Oral   Take 2,000 mg by mouth 3 (three) times daily with meals.          . Carboxymethylcellulose Sodium (REFRESH OP)   Right Eye   Place 1 application into the right eye 2 (two) times daily as needed. For pain and dryness in left eye         . PARoxetine (PAXIL) 10 MG tablet   Oral   Take 1 tablet (10 mg total) by mouth every morning.   30 tablet   6   . simvastatin (ZOCOR) 20 MG tablet   Oral   Take 1 tablet (20 mg total) by mouth at bedtime.   30 tablet   3     BP 130/88  Pulse 72  Temp(Src) 97.7 F (36.5 C) (Oral)  Resp 20  SpO2 96%  Physical Exam  Nursing note and vitals reviewed. Constitutional: She is oriented to person, place, and time.  Chronically ill, NAD   HENT:  Head: Normocephalic.  Mouth/Throat: Oropharynx is clear and moist.  Eyes: Conjunctivae are normal. Pupils are equal, round, and reactive to light.  Neck: Normal range  of motion. Neck supple.  Cardiovascular: Regular rhythm and normal heart sounds.   Slightly bradycardic   Pulmonary/Chest: Effort normal and breath sounds normal. No respiratory distress. She has no wheezes. She has no rales.  Abdominal: Soft. Bowel sounds are normal. She exhibits no distension. There is no tenderness. There is no rebound.  Musculoskeletal: Normal range of motion. She exhibits no edema and no tenderness.  Neurological: She is alert and oriented to person, place, and time.  R facial droop (chronic from instrumentation during birth)  Skin: Skin is warm and dry.  Psychiatric: She has a normal mood and affect. Her behavior is normal. Judgment and thought content normal.    ED Course  Procedures (including critical care time)  Labs Reviewed  CBC WITH DIFFERENTIAL - Abnormal; Notable for the following:    RBC 3.56 (*)    Hemoglobin 11.4 (*)    HCT 34.7 (*)    All other components within normal limits  BASIC METABOLIC PANEL - Abnormal; Notable for the following:    Potassium 6.0 (*)    Glucose, Bld 130 (*)    BUN 77 (*)    Creatinine, Ser 8.80 (*)    GFR calc non Af Amer 4 (*)    GFR calc Af Amer 4 (*)    All other components within normal limits  TSH  T4, FREE  POCT I-STAT TROPONIN I   No results found.   No diagnosis found.   Date: 08/07/2012  Rate: 48  Rhythm: sinus bradycardia  QRS Axis: normal  Intervals: normal  ST/T Wave abnormalities: normal  Conduction Disutrbances:none  Narrative Interpretation:   Old EKG Reviewed: unchanged    MDM  [name] is a 79 y.o. female here with asymptomatic bradycardia. Not hypotensive here. Feels well. Will consult cardiology to f/u Holter results and further management.   12:28 PM [place] cards saw patient. Stable for d/c. Doesn't need pacemaker right now. HR in the 40-50s in the ED. Labs showed K 6.0. She is going to be discharged directly from  here to dialysis.         Richardean Canal, MD 08/07/12  1229

## 2012-08-07 NOTE — Consult Note (Signed)
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 Jens Som for bradycardia, murmur, and hypotension. 2D echo showed normal EF with no significant valvular disease, murmur felt due to AVF. Nuclear stress test showed somewhat prominent RV on nuclear images (but normal on echo) and she did have some inferolateral ST segment depression in recovery, but the study was negative for ischemia and felt to be low risk. At that visit she also reported seeing HR in the 30's on her BP pressure machine thus Holter was planned, results pending. She was asymptomatic while wearing this. She also reported hypotension associated with dialysis and her her amlodipine was changed to AM dosing & holding on dialysis days. Apparently her BP has been quite labile as she is also hypertensive at times, such as today when presenting BP in the ER was 206/52. She presented to the ER today at the direction of her dialysis center. She was about 5 minutes into dialysis when her HR was noted to be in the 30's and 40's. She was completely asymptomatic with this and denies any dizziness, SOB, chest pain, lightheadedness, presyncope, syncope or nausea. EKG shows ectopic atrial bradycardia which has been seen on prior tracings. HR in the ER is running 38-60. She remains completely asymptomatic even when HR falls transiently into the upper 30's. She does endorse that a few times a month she will have a sudden hot flash lasting 1-2 seconds but this resolves spontaneously and is not associated with any other symptoms. She has had a few mechanical  falls over the years (i.e. tripping over mulch) but no falls related to dizziness or syncope. She states she would not have come to the ER if they didn't make her. She feels at baseline without any complaints. Troponin neg x 1, Hgb 11.4, WBC WNL, BUN/Cr 77/8.8, K of 6.0 with no mention of hemolysis. She is not on any AV nodal blocking agents. HR currently 50's.  Past Medical History  Diagnosis Date  . Type 2 diabetes with nephropathy     resolved with weight loss  . Hyperlipidemia   . Hypertension   . Anxiety   . Facial droop 1935    acquired during forceps delivery, some residual R visual loss  . Anemia in chronic kidney disease     IV iron infusion  . Hx of breast cancer 2004  . Arthritis     mild in lower back  . End stage renal disease on dialysis     HD M,W,F [facility]  . GERD (gastroesophageal reflux disease)   . Cardiac murmur     a. thought due to AVF (2D echo 06/2012 without significant valvular disease).   . Bradycardia     a. Noted 06/2012.  Marland Kitchen Hypotension     a. Associated w/ dialysis.     Most Recent Cardiac Studies: Nuclear Stress Test 08/01/12 Impression  Exercise Capacity: Fair exercise capacity.  BP Response: Normal blood pressure response.  Clinical Symptoms: There is dyspnea.  ECG Impression: inferolateral ST segment depression in recovery  Comparison with Prior Nuclear Study: No images to  compare  Overall Impression: Low risk stress nuclear study Dyspnea with somewhat prominant RV on nuclear images Positive ECG in recovery.  LV Ejection Fraction: 75%. LV Wall Motion: NL LV Function; NL Wall Motion  2D Echo 07/27/12 Study Conclusions - Left ventricle: The cavity size was normal. Wall thickness was increased in a pattern of moderate LVH. Systolic function was vigorous. The estimated ejection fraction was in the range of 65% to 70%. Wall motion was normal; there were no regional wall motion abnormalities. Doppler parameters are consistent with  abnormal left ventricular relaxation (grade 1 diastolic dysfunction). - Aortic valve: Trivial regurgitation. - Mitral valve: Calcified annulus. Mildly thickened leaflets .- Left atrium: The atrium was moderately dilated.   Surgical History:  Past Surgical History  Procedure Laterality Date  . Cataract extraction  1981  . Eye surgery  1966  . Av fistula placement  07/20/10    Right brachiocephalic AVF  . Breast lumpectomy    . Breast biopsy    . Umbilical hernia repair    . Dexa  2013    [facility]  . US echocardiography  06/2012    LVH, EF 65%, grade 1 diastol dysfunction, mod dliated LAD     Home Meds: Prior to Admission medications   Medication Sig Start Date End Date Taking? Authorizing Provider  amLODipine (NORVASC) 5 MG tablet Take 5 mg by mouth daily as directed. Daily except do not take on dialysis days.   Yes Historical Provider, MD  aspirin EC 81 MG tablet Take 81 mg by mouth at bedtime.    Yes Historical Provider, MD  B Complex-C-Folic Acid (RENA-VITE PO) Take 1 tablet by mouth daily with breakfast.    Yes Historical Provider, MD  Calcium Carbonate Antacid (TUMS ULTRA 1000 PO) Take 2,000 mg by mouth 3 (three) times daily with meals.    Yes Historical Provider, MD  Carboxymethylcellulose Sodium (REFRESH OP) Place 1 application into the right eye 2 (two) times daily as needed. For pain and dryness in left eye   Yes Historical Provider, MD  PARoxetine (PAXIL) 10 MG tablet Take 1 tablet (10 mg total) by mouth every morning. 06/19/12  Yes Eustaquio Boyden, MD  simvastatin (ZOCOR) 20 MG tablet Take 1 tablet (20 mg total) by mouth at bedtime. 07/28/12  Yes Eustaquio Boyden, MD    Allergies:  Allergies  Allergen Reactions  . Sulfa Antibiotics Rash    History   Social History  . Marital Status: Single    Spouse Name: N/A    Number of Children: N/A  . Years of Education: N/A   Occupational History  . Not on file.   Social History Main Topics  . Smoking status: Former Smoker     Types: Cigarettes    Quit date: 03/29/1953  . Smokeless tobacco: Former Neurosurgeon  . Alcohol Use: Yes     Comment: occasional  . Drug Use: No  . Sexually Active: Not on file   Other Topics Concern  . Not on file   Social History Narrative   Lives with brother Louanne Skye) and sister in law (June)   Occupation: retired, was Therapist, nutritional for [employer]   Edu: some college     Family History  Problem Relation Age of Onset  . Heart disease Brother     Congenital  . Diabetes Brother   . Cancer Father     stomach  . Cancer Sister     female (uterus?)  . CAD Father  MI at age 10    Review of Systems: General: negative for chills, fever, night sweats or weight changes.  Cardiovascular: negative for chest pain, edema, orthopnea, palpitations, paroxysmal nocturnal dyspnea. When BP runs low she does get some DOE but this is chronic for years and unchanged. Dermatological: negative for rash Respiratory: negative for cough or wheezing Urologic: negative for hematuria Abdominal: negative for nausea, vomiting, diarrhea, bright red blood per rectum, melena, or hematemesis Neurologic: negative for visual changes, syncope, or dizziness All other systems reviewed and are otherwise negative except as noted above.  Labs:   Lab Results  Component Value Date   WBC 7.9 08/07/2012   HGB 11.4* 08/07/2012   HCT 34.7* 08/07/2012   MCV 97.5 08/07/2012   PLT 241 08/07/2012     Recent Labs Lab 08/07/12 0904  NA 136  K 6.0*  CL 96  CO2 23  BUN 77*  CREATININE 8.80*  CALCIUM 9.7  GLUCOSE 130*   Radiology/Studies:  No results found.   EKG: ectopic atrial bradycardia 48bpm consider anterior infarct no acute ST-T changes  Physical Exam: Blood pressure 148/116, pulse 49, temperature 97.7 F (36.5 C), temperature source Oral, resp. rate 17, SpO2 99.00%. General: Well developed, well nourished elderly WF in no acute distress. Head: Normocephalic, atraumatic, sclera non-icteric,  no xanthomas, nares are without discharge. R facial droop with R tear production. Neck: Negative for carotid bruits. JVD not elevated. Lungs: Clear bilaterally to auscultation without wheezes, rales, or rhonchi. Breathing is unlabored. Heart: Regular bradycardic with S1 S2. 2/6 SEM. No rubs or gallops appreciated. Abdomen: Soft, non-tender, non-distended with normoactive bowel sounds. No hepatomegaly. No rebound/guarding. No obvious abdominal masses. Msk:  Strength and tone appear normal for age. Extremities: No clubbing or cyanosis. No edema.  Distal pedal pulses are 2+ and equal bilaterally. AV fistula RUE. Neuro: Alert and oriented X 3. No focal deficit. No facial asymmetry. Moves all extremities spontaneously. Some speech slurring related to chronic R facial droop. Psych:  Responds to questions appropriately with a normal affect.    ASSESSMENT AND PLAN:  1. Asymptomatic ectopic atrial bradycardia 2. Labile HTN with h/o hypotension on dialysis days 3. ESRD on HD with evidence for hyperkalemia 4. Diabetes mellitus  Would recommend to continue to observe for symptoms as she is currently asymptomatic. Wonder if hyperkalemia is contributing as her potassium is currently 6 - needs to finish dialysis - recommend to complete dialysis today for correction of electrolytes. If she again develops asymptomatic bradycardia with dialysis, she should probably have her potassium checked and corrected before returning to the ER. If she becomes symptomatic, she should return to ER as she may eventually require pacemaker implantation but would hold off in the absence of any symptoms. She will follow up in the office with Dr. Jens Som as previously planned. She knows to call our office if she develops symptoms in the meantime. Avoid all AV nodal blocking agents. Will order TSH and free T4 - does not need to wait for these results, we will f/u. See below for additional thoughts.   Signed, Ronie Spies  PA-C 08/07/2012, 12:01 PM Patient seen and examined. I agree with the assessment and plan as detailed above. See also my additional thoughts below.   I have reviewed all the information carefully with Ronie Spies, PA-C. I agree with the complete note as modified above. The patient has known bradycardia. This is currently being evaluated by Dr. Jens Som. There is a Holter monitor that has been completed. The  results are not yet available. The patient had asymptomatic bradycardia today that was noted at dialysis and she was sent to the emergency room. Her potassium is 6. She is stable at this time. The plan for today will be for her to return to dialysis for complete dialysis to be sure that her potassium is corrected completely. I will talk with Dr. Jens Som and ask him to followup in the Holter as soon as it is available so that he can make any further recommendations about the approach to her rhythm.  Willa Rough, MD, [facility] 08/07/2012 12:35 PM

## 2012-08-07 NOTE — ED Notes (Signed)
Per EMS- pt went to dialysis this morning and had vitals checked. Hr was noted to be in 30-40s. No symptoms. Pt denies pain or SOB. BP 215/76. 20g in Left forearm.

## 2012-08-07 NOTE — ED Notes (Signed)
dialisis center states that they can see pt today. Transportation will be arranged

## 2012-08-08 ENCOUNTER — Telehealth: Payer: Self-pay | Admitting: Cardiology

## 2012-08-08 NOTE — Telephone Encounter (Signed)
New problem   Pt want results from her monitor test please call pt.

## 2012-08-08 NOTE — Telephone Encounter (Signed)
Follow Up ° ° ° ° °Pt is calling in following up on phone call from earlier. Please call. °

## 2012-08-08 NOTE — Telephone Encounter (Signed)
Left message for pt, monitor is here to be reviewed by dr Jens Som. Will be able to call her back tomorrow once reviewed.

## 2012-08-09 ENCOUNTER — Other Ambulatory Visit: Payer: Self-pay | Admitting: Nephrology

## 2012-08-09 DIAGNOSIS — N186 End stage renal disease: Secondary | ICD-10-CM | POA: Diagnosis not present

## 2012-08-09 DIAGNOSIS — B9789 Other viral agents as the cause of diseases classified elsewhere: Secondary | ICD-10-CM | POA: Diagnosis not present

## 2012-08-09 DIAGNOSIS — E1129 Type 2 diabetes mellitus with other diabetic kidney complication: Secondary | ICD-10-CM | POA: Diagnosis not present

## 2012-08-09 DIAGNOSIS — D631 Anemia in chronic kidney disease: Secondary | ICD-10-CM | POA: Diagnosis not present

## 2012-08-09 DIAGNOSIS — D509 Iron deficiency anemia, unspecified: Secondary | ICD-10-CM | POA: Diagnosis not present

## 2012-08-09 LAB — POTASSIUM: Potassium: 4.9 mmol/L (ref 3.5–5.1)

## 2012-08-09 NOTE — Telephone Encounter (Signed)
Spoke with pt, aware monitor reviewed by dr Jens Som shows sinus to sinus brady; PAC's & PVC's. She is aware that when her heart rate gets low before the dialysis center sends her to the hosp if she is asymptomatic they need to call our office first. Also she is concerned about bp being elevated prior to dialysis, explained to pt that her bp needs to be high because during dialysis it drops into the 80's. She voiced understanding.

## 2012-08-11 ENCOUNTER — Telehealth: Payer: Self-pay | Admitting: Cardiology

## 2012-08-11 DIAGNOSIS — E1129 Type 2 diabetes mellitus with other diabetic kidney complication: Secondary | ICD-10-CM | POA: Diagnosis not present

## 2012-08-11 DIAGNOSIS — B9789 Other viral agents as the cause of diseases classified elsewhere: Secondary | ICD-10-CM | POA: Diagnosis not present

## 2012-08-11 DIAGNOSIS — D509 Iron deficiency anemia, unspecified: Secondary | ICD-10-CM | POA: Diagnosis not present

## 2012-08-11 DIAGNOSIS — D631 Anemia in chronic kidney disease: Secondary | ICD-10-CM | POA: Diagnosis not present

## 2012-08-11 DIAGNOSIS — N186 End stage renal disease: Secondary | ICD-10-CM | POA: Diagnosis not present

## 2012-08-11 NOTE — Telephone Encounter (Signed)
Will discuss with dr Jens Som

## 2012-08-11 NOTE — Telephone Encounter (Signed)
Pt calling re dialysis , needs letter from dr Jens Som stating that when her bp gets too low, if her pulse gets in the 30's or 40's they dont have to send her to the hospital , per dr Jens Som as long as she felt ok and could stand up she didn't have to go , but they will send her unless they have a letter, she goes to  [facility] @[phone]

## 2012-08-12 NOTE — Telephone Encounter (Signed)
Nephrology should address her low BP on dialysis. She does not need to go to ER for asymptomatic sinus bradycardia [name]

## 2012-08-14 DIAGNOSIS — D509 Iron deficiency anemia, unspecified: Secondary | ICD-10-CM | POA: Diagnosis not present

## 2012-08-14 DIAGNOSIS — N039 Chronic nephritic syndrome with unspecified morphologic changes: Secondary | ICD-10-CM | POA: Diagnosis not present

## 2012-08-14 DIAGNOSIS — N186 End stage renal disease: Secondary | ICD-10-CM | POA: Diagnosis not present

## 2012-08-14 DIAGNOSIS — B9789 Other viral agents as the cause of diseases classified elsewhere: Secondary | ICD-10-CM | POA: Diagnosis not present

## 2012-08-14 DIAGNOSIS — E1129 Type 2 diabetes mellitus with other diabetic kidney complication: Secondary | ICD-10-CM | POA: Diagnosis not present

## 2012-08-15 NOTE — Telephone Encounter (Signed)
New problem   Pt want to know if she can take Midodrine. Pt stated nurse at dialysis center said this medication might cause her BP to stay up. Please call pt.

## 2012-08-16 NOTE — Telephone Encounter (Signed)
Left message for pt to call.

## 2012-08-17 DIAGNOSIS — D509 Iron deficiency anemia, unspecified: Secondary | ICD-10-CM | POA: Diagnosis not present

## 2012-08-17 DIAGNOSIS — B9789 Other viral agents as the cause of diseases classified elsewhere: Secondary | ICD-10-CM | POA: Diagnosis not present

## 2012-08-17 DIAGNOSIS — N039 Chronic nephritic syndrome with unspecified morphologic changes: Secondary | ICD-10-CM | POA: Diagnosis not present

## 2012-08-17 DIAGNOSIS — E1129 Type 2 diabetes mellitus with other diabetic kidney complication: Secondary | ICD-10-CM | POA: Diagnosis not present

## 2012-08-17 DIAGNOSIS — N186 End stage renal disease: Secondary | ICD-10-CM | POA: Diagnosis not present

## 2012-08-18 DIAGNOSIS — B9789 Other viral agents as the cause of diseases classified elsewhere: Secondary | ICD-10-CM | POA: Diagnosis not present

## 2012-08-18 DIAGNOSIS — N186 End stage renal disease: Secondary | ICD-10-CM | POA: Diagnosis not present

## 2012-08-18 DIAGNOSIS — N039 Chronic nephritic syndrome with unspecified morphologic changes: Secondary | ICD-10-CM | POA: Diagnosis not present

## 2012-08-18 DIAGNOSIS — E1129 Type 2 diabetes mellitus with other diabetic kidney complication: Secondary | ICD-10-CM | POA: Diagnosis not present

## 2012-08-18 DIAGNOSIS — D509 Iron deficiency anemia, unspecified: Secondary | ICD-10-CM | POA: Diagnosis not present

## 2012-08-21 DIAGNOSIS — B9789 Other viral agents as the cause of diseases classified elsewhere: Secondary | ICD-10-CM | POA: Diagnosis not present

## 2012-08-21 DIAGNOSIS — E1129 Type 2 diabetes mellitus with other diabetic kidney complication: Secondary | ICD-10-CM | POA: Diagnosis not present

## 2012-08-21 DIAGNOSIS — N186 End stage renal disease: Secondary | ICD-10-CM | POA: Diagnosis not present

## 2012-08-21 DIAGNOSIS — D509 Iron deficiency anemia, unspecified: Secondary | ICD-10-CM | POA: Diagnosis not present

## 2012-08-21 DIAGNOSIS — N039 Chronic nephritic syndrome with unspecified morphologic changes: Secondary | ICD-10-CM | POA: Diagnosis not present

## 2012-08-23 DIAGNOSIS — D509 Iron deficiency anemia, unspecified: Secondary | ICD-10-CM | POA: Diagnosis not present

## 2012-08-23 DIAGNOSIS — N186 End stage renal disease: Secondary | ICD-10-CM | POA: Diagnosis not present

## 2012-08-23 DIAGNOSIS — E1129 Type 2 diabetes mellitus with other diabetic kidney complication: Secondary | ICD-10-CM | POA: Diagnosis not present

## 2012-08-23 DIAGNOSIS — B9789 Other viral agents as the cause of diseases classified elsewhere: Secondary | ICD-10-CM | POA: Diagnosis not present

## 2012-08-23 DIAGNOSIS — D631 Anemia in chronic kidney disease: Secondary | ICD-10-CM | POA: Diagnosis not present

## 2012-08-25 DIAGNOSIS — B9789 Other viral agents as the cause of diseases classified elsewhere: Secondary | ICD-10-CM | POA: Diagnosis not present

## 2012-08-25 DIAGNOSIS — D509 Iron deficiency anemia, unspecified: Secondary | ICD-10-CM | POA: Diagnosis not present

## 2012-08-25 DIAGNOSIS — N186 End stage renal disease: Secondary | ICD-10-CM | POA: Diagnosis not present

## 2012-08-25 DIAGNOSIS — D631 Anemia in chronic kidney disease: Secondary | ICD-10-CM | POA: Diagnosis not present

## 2012-08-25 DIAGNOSIS — E1129 Type 2 diabetes mellitus with other diabetic kidney complication: Secondary | ICD-10-CM | POA: Diagnosis not present

## 2012-08-26 DIAGNOSIS — N186 End stage renal disease: Secondary | ICD-10-CM | POA: Diagnosis not present

## 2012-08-28 DIAGNOSIS — D509 Iron deficiency anemia, unspecified: Secondary | ICD-10-CM | POA: Diagnosis not present

## 2012-08-28 DIAGNOSIS — D631 Anemia in chronic kidney disease: Secondary | ICD-10-CM | POA: Diagnosis not present

## 2012-08-28 DIAGNOSIS — E1129 Type 2 diabetes mellitus with other diabetic kidney complication: Secondary | ICD-10-CM | POA: Diagnosis not present

## 2012-08-28 DIAGNOSIS — N2581 Secondary hyperparathyroidism of renal origin: Secondary | ICD-10-CM | POA: Diagnosis not present

## 2012-08-28 DIAGNOSIS — N186 End stage renal disease: Secondary | ICD-10-CM | POA: Diagnosis not present

## 2012-08-30 ENCOUNTER — Encounter: Payer: Self-pay | Admitting: *Deleted

## 2012-08-30 LAB — CBC: HGB: 11.2 g/dL

## 2012-08-30 NOTE — Telephone Encounter (Signed)
Per dr Jens Som he would like the dialysis center to take care of any low bp during dialysis. Left message for pt.

## 2012-09-05 ENCOUNTER — Ambulatory Visit: Payer: Medicare Other | Admitting: Family Medicine

## 2012-09-13 DIAGNOSIS — E1129 Type 2 diabetes mellitus with other diabetic kidney complication: Secondary | ICD-10-CM | POA: Diagnosis not present

## 2012-09-25 ENCOUNTER — Ambulatory Visit (INDEPENDENT_AMBULATORY_CARE_PROVIDER_SITE_OTHER): Payer: Medicare Other | Admitting: Cardiology

## 2012-09-25 ENCOUNTER — Encounter: Payer: Self-pay | Admitting: Cardiology

## 2012-09-25 ENCOUNTER — Encounter: Payer: Medicare Other | Admitting: Cardiology

## 2012-09-25 ENCOUNTER — Ambulatory Visit: Payer: Medicare Other | Admitting: Cardiology

## 2012-09-25 VITALS — BP 153/80 | HR 72 | Wt 111.0 lb

## 2012-09-25 DIAGNOSIS — E785 Hyperlipidemia, unspecified: Secondary | ICD-10-CM | POA: Diagnosis not present

## 2012-09-25 DIAGNOSIS — R9431 Abnormal electrocardiogram [ECG] [EKG]: Secondary | ICD-10-CM

## 2012-09-25 DIAGNOSIS — R001 Bradycardia, unspecified: Secondary | ICD-10-CM

## 2012-09-25 DIAGNOSIS — N186 End stage renal disease: Secondary | ICD-10-CM | POA: Diagnosis not present

## 2012-09-25 DIAGNOSIS — I10 Essential (primary) hypertension: Secondary | ICD-10-CM | POA: Diagnosis not present

## 2012-09-25 DIAGNOSIS — I498 Other specified cardiac arrhythmias: Secondary | ICD-10-CM | POA: Diagnosis not present

## 2012-09-25 NOTE — Progress Notes (Signed)
HPI: Pleasant female I initially saw in April 2014 for evaluation of bradycardia, murmur and hypotension. She is dialysis dependent. The patient noted that she had hypotension predominantly on dialysis days and we held her blood pressure medications on those days. Holter monitor in April of 2014 showed sinus rhythm to sinus bradycardia with PACs and PVCs. Echocardiogram in May of 2014 showed an ejection fraction of 65-70%, moderate left ventricular hypertrophy, grade 1 diastolic dysfunction and moderate left atrial enlargement. There was trace aortic insufficiency. Nuclear study in May of 2014 showed positive ECG changes but normal perfusion and an ejection fraction of 75%. RV felt to be prominent. Since I last saw her, she denies dyspnea, chest pain or syncope. Her blood pressure continues to drop on dialysis days but not quite as bad. She does not take her Norvasc on those days.   Current Outpatient Prescriptions  Medication Sig Dispense Refill  . amLODipine (NORVASC) 5 MG tablet Take 5 mg by mouth as directed. Daily except do not take on dialysis days.      Marland Kitchen aspirin EC 81 MG tablet Take 81 mg by mouth at bedtime.       . B Complex-C-Folic Acid (RENA-VITE PO) Take 1 tablet by mouth daily with breakfast.       . Calcium Carbonate Antacid (TUMS ULTRA 1000 PO) Take 2,000 mg by mouth 3 (three) times daily with meals.       . Carboxymethylcellulose Sodium (REFRESH OP) Place 1 application into the right eye 2 (two) times daily as needed. For pain and dryness in left eye      . PARoxetine (PAXIL) 10 MG tablet Take 1 tablet (10 mg total) by mouth every morning.  30 tablet  6  . simvastatin (ZOCOR) 20 MG tablet Take 1 tablet (20 mg total) by mouth at bedtime.  30 tablet  3   No current facility-administered medications for this visit.     Past Medical History  Diagnosis Date  . Type 2 diabetes with nephropathy     resolved with weight loss  . Hyperlipidemia   . Hypertension   . Anxiety   .  Facial droop 1935    acquired during forceps delivery, some residual R visual loss  . Anemia in chronic kidney disease     IV iron infusion  . Hx of breast cancer 2004  . Arthritis     mild in lower back  . End stage renal disease on dialysis     HD M,W,F [facility]  . GERD (gastroesophageal reflux disease)   . Cardiac murmur     a. thought due to AVF (2D echo 06/2012 without significant valvular disease).   . Bradycardia     a. Noted 06/2012.  Marland Kitchen Hypotension     a. Associated w/ dialysis.    Past Surgical History  Procedure Laterality Date  . Cataract extraction  1981  . Eye surgery  1966  . Av fistula placement  07/20/10    Right brachiocephalic AVF  . Breast lumpectomy    . Breast biopsy    . Umbilical hernia repair    . Dexa  2013    [facility]  . US echocardiography  06/2012    LVH, EF 65%, grade 1 diastol dysfunction, mod dliated LAD    History   Social History  . Marital Status: Single    Spouse Name: N/A    Number of Children: N/A  . Years of Education: N/A   Occupational History  .  Not on file.   Social History Main Topics  . Smoking status: Former Smoker    Types: Cigarettes    Quit date: 03/29/1953  . Smokeless tobacco: Former Neurosurgeon  . Alcohol Use: Yes     Comment: occasional  . Drug Use: No  . Sexually Active: Not on file   Other Topics Concern  . 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 Remaining systems are negative.  Physical Exam: Well-developed well-nourished in no acute distress.  Skin is warm and dry.  HEENT with right facial droop Neck is supple.  Chest is clear to auscultation with normal  expansion.  Cardiovascular exam is regular rate and rhythm. 1/6 systolic murmur Abdominal exam nontender or distended. No masses palpated. Extremities show no edema. neuro grossly intact

## 2012-09-25 NOTE — Assessment & Plan Note (Signed)
Myoview unremarkable. No plans for further evaluation.

## 2012-09-25 NOTE — Assessment & Plan Note (Signed)
Patient has not had symptoms of bradycardia. No further intervention indicated.

## 2012-09-25 NOTE — Progress Notes (Addendum)
HPI: 79 year old female I initially saw in April 2014 for evaluation of bradycardia, murmur and hypotension. She is dialysis dependent. The patient noted that she had hypotension predominantly on dialysis days and we held her blood pressure medications on those days. Holter monitor in April of 2014 showed sinus rhythm to sinus bradycardia with PACs and PVCs. Echocardiogram in May of 2014 showed an ejection fraction of 65-70%, moderate left ventricular hypertrophy, grade 1 diastolic dysfunction and moderate left atrial enlargement. There was trace aortic insufficiency. Nuclear study in May of 2014 showed positive ECG changes but normal perfusion and an ejection fraction of 75%. RV felt to be prominent. Since I last saw her,    Current Outpatient Prescriptions  Medication Sig Dispense Refill  . amLODipine (NORVASC) 5 MG tablet Take 5 mg by mouth as directed. Daily except do not take on dialysis days.      Marland Kitchen aspirin EC 81 MG tablet Take 81 mg by mouth at bedtime.       . B Complex-C-Folic Acid (RENA-VITE PO) Take 1 tablet by mouth daily with breakfast.       . Calcium Carbonate Antacid (TUMS ULTRA 1000 PO) Take 2,000 mg by mouth 3 (three) times daily with meals.       . Carboxymethylcellulose Sodium (REFRESH OP) Place 1 application into the right eye 2 (two) times daily as needed. For pain and dryness in left eye      . PARoxetine (PAXIL) 10 MG tablet Take 1 tablet (10 mg total) by mouth every morning.  30 tablet  6  . simvastatin (ZOCOR) 20 MG tablet Take 1 tablet (20 mg total) by mouth at bedtime.  30 tablet  3   No current facility-administered medications for this visit.     Past Medical History  Diagnosis Date  . Type 2 diabetes with nephropathy     resolved with weight loss  . Hyperlipidemia   . Hypertension   . Anxiety   . Facial droop 1935    acquired during forceps delivery, some residual R visual loss  . Anemia in chronic kidney disease     IV iron infusion  . Hx of breast  cancer 2004  . Arthritis     mild in lower back  . End stage renal disease on dialysis     HD M,W,F [facility]  . GERD (gastroesophageal reflux disease)   . Cardiac murmur     a. thought due to AVF (2D echo 06/2012 without significant valvular disease).   . Bradycardia     a. Noted 06/2012.  Marland Kitchen Hypotension     a. Associated w/ dialysis.    Past Surgical History  Procedure Laterality Date  . Cataract extraction  1981  . Eye surgery  1966  . Av fistula placement  07/20/10    Right brachiocephalic AVF  . Breast lumpectomy    . Breast biopsy    . Umbilical hernia repair    . [facility]a  2013    [facility]  . US echocardiography  06/2012    LVH, EF 65%, grade 1 diastol dysfunction, mod dliated LAD    History   Social History  . Marital Status: Single    Spouse Name: N/A    Number of Children: N/A  . Years of Education: N/A   Occupational History  . Not on file.   Social History Main Topics  . Smoking status: Former Smoker    Types: Cigarettes    Quit date: 03/29/1953  .  Smokeless tobacco: Former Neurosurgeon  . Alcohol Use: Yes     Comment: occasional  . Drug Use: No  . Sexually Active: Not on file   Other Topics Concern  . 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 Remaining systems are negative.  Physical Exam: Well-developed well-nourished in no acute distress.  Skin is warm and dry.  HEENT is normal.  Neck is supple.  Chest is clear to auscultation with normal expansion.  Cardiovascular exam is regular rate and rhythm.  Abdominal exam nontender or distended. No masses palpated. Extremities show no edema. neuro grossly  intact  ECG     This encounter was created in error - please disregard.

## 2012-09-25 NOTE — Assessment & Plan Note (Signed)
Continue statin. 

## 2012-09-25 NOTE — Patient Instructions (Addendum)
Your physician recommends that you schedule a follow-up appointment in: AS NEEDED  

## 2012-09-25 NOTE — Assessment & Plan Note (Signed)
Blood pressure is improved on dialysis days with holding Norvasc. We'll continue to take only on nondialysis days.

## 2012-09-27 DIAGNOSIS — D631 Anemia in chronic kidney disease: Secondary | ICD-10-CM | POA: Diagnosis not present

## 2012-09-27 DIAGNOSIS — D509 Iron deficiency anemia, unspecified: Secondary | ICD-10-CM | POA: Diagnosis not present

## 2012-09-27 DIAGNOSIS — N186 End stage renal disease: Secondary | ICD-10-CM | POA: Diagnosis not present

## 2012-09-27 DIAGNOSIS — E1129 Type 2 diabetes mellitus with other diabetic kidney complication: Secondary | ICD-10-CM | POA: Diagnosis not present

## 2012-10-17 ENCOUNTER — Ambulatory Visit: Payer: Medicare Other | Admitting: Cardiology

## 2012-10-20 ENCOUNTER — Encounter: Payer: Self-pay | Admitting: Family Medicine

## 2012-10-26 DIAGNOSIS — N186 End stage renal disease: Secondary | ICD-10-CM | POA: Diagnosis not present

## 2012-10-27 ENCOUNTER — Encounter: Payer: Self-pay | Admitting: Family Medicine

## 2012-10-27 DIAGNOSIS — N186 End stage renal disease: Secondary | ICD-10-CM | POA: Diagnosis not present

## 2012-10-30 DIAGNOSIS — N186 End stage renal disease: Secondary | ICD-10-CM | POA: Diagnosis not present

## 2012-10-30 DIAGNOSIS — E1129 Type 2 diabetes mellitus with other diabetic kidney complication: Secondary | ICD-10-CM | POA: Diagnosis not present

## 2012-10-30 DIAGNOSIS — D631 Anemia in chronic kidney disease: Secondary | ICD-10-CM | POA: Diagnosis not present

## 2012-10-30 DIAGNOSIS — D509 Iron deficiency anemia, unspecified: Secondary | ICD-10-CM | POA: Diagnosis not present

## 2012-11-01 DIAGNOSIS — E1129 Type 2 diabetes mellitus with other diabetic kidney complication: Secondary | ICD-10-CM | POA: Diagnosis not present

## 2012-11-01 DIAGNOSIS — N186 End stage renal disease: Secondary | ICD-10-CM | POA: Diagnosis not present

## 2012-11-01 DIAGNOSIS — D631 Anemia in chronic kidney disease: Secondary | ICD-10-CM | POA: Diagnosis not present

## 2012-11-01 DIAGNOSIS — D509 Iron deficiency anemia, unspecified: Secondary | ICD-10-CM | POA: Diagnosis not present

## 2012-11-02 DIAGNOSIS — H02109 Unspecified ectropion of unspecified eye, unspecified eyelid: Secondary | ICD-10-CM | POA: Diagnosis not present

## 2012-11-03 DIAGNOSIS — N186 End stage renal disease: Secondary | ICD-10-CM | POA: Diagnosis not present

## 2012-11-03 DIAGNOSIS — N039 Chronic nephritic syndrome with unspecified morphologic changes: Secondary | ICD-10-CM | POA: Diagnosis not present

## 2012-11-03 DIAGNOSIS — D509 Iron deficiency anemia, unspecified: Secondary | ICD-10-CM | POA: Diagnosis not present

## 2012-11-03 DIAGNOSIS — E1129 Type 2 diabetes mellitus with other diabetic kidney complication: Secondary | ICD-10-CM | POA: Diagnosis not present

## 2012-11-06 DIAGNOSIS — N039 Chronic nephritic syndrome with unspecified morphologic changes: Secondary | ICD-10-CM | POA: Diagnosis not present

## 2012-11-06 DIAGNOSIS — D509 Iron deficiency anemia, unspecified: Secondary | ICD-10-CM | POA: Diagnosis not present

## 2012-11-06 DIAGNOSIS — E1129 Type 2 diabetes mellitus with other diabetic kidney complication: Secondary | ICD-10-CM | POA: Diagnosis not present

## 2012-11-06 DIAGNOSIS — N186 End stage renal disease: Secondary | ICD-10-CM | POA: Diagnosis not present

## 2012-11-08 DIAGNOSIS — N186 End stage renal disease: Secondary | ICD-10-CM | POA: Diagnosis not present

## 2012-11-08 DIAGNOSIS — N039 Chronic nephritic syndrome with unspecified morphologic changes: Secondary | ICD-10-CM | POA: Diagnosis not present

## 2012-11-08 DIAGNOSIS — D509 Iron deficiency anemia, unspecified: Secondary | ICD-10-CM | POA: Diagnosis not present

## 2012-11-08 DIAGNOSIS — E1129 Type 2 diabetes mellitus with other diabetic kidney complication: Secondary | ICD-10-CM | POA: Diagnosis not present

## 2012-11-10 DIAGNOSIS — E1129 Type 2 diabetes mellitus with other diabetic kidney complication: Secondary | ICD-10-CM | POA: Diagnosis not present

## 2012-11-10 DIAGNOSIS — N186 End stage renal disease: Secondary | ICD-10-CM | POA: Diagnosis not present

## 2012-11-10 DIAGNOSIS — D631 Anemia in chronic kidney disease: Secondary | ICD-10-CM | POA: Diagnosis not present

## 2012-11-10 DIAGNOSIS — D509 Iron deficiency anemia, unspecified: Secondary | ICD-10-CM | POA: Diagnosis not present

## 2012-11-13 DIAGNOSIS — E1129 Type 2 diabetes mellitus with other diabetic kidney complication: Secondary | ICD-10-CM | POA: Diagnosis not present

## 2012-11-13 DIAGNOSIS — D509 Iron deficiency anemia, unspecified: Secondary | ICD-10-CM | POA: Diagnosis not present

## 2012-11-13 DIAGNOSIS — N186 End stage renal disease: Secondary | ICD-10-CM | POA: Diagnosis not present

## 2012-11-13 DIAGNOSIS — D631 Anemia in chronic kidney disease: Secondary | ICD-10-CM | POA: Diagnosis not present

## 2012-11-14 DIAGNOSIS — H16219 Exposure keratoconjunctivitis, unspecified eye: Secondary | ICD-10-CM | POA: Diagnosis not present

## 2012-11-15 DIAGNOSIS — D631 Anemia in chronic kidney disease: Secondary | ICD-10-CM | POA: Diagnosis not present

## 2012-11-15 DIAGNOSIS — E1129 Type 2 diabetes mellitus with other diabetic kidney complication: Secondary | ICD-10-CM | POA: Diagnosis not present

## 2012-11-15 DIAGNOSIS — N186 End stage renal disease: Secondary | ICD-10-CM | POA: Diagnosis not present

## 2012-11-15 DIAGNOSIS — D509 Iron deficiency anemia, unspecified: Secondary | ICD-10-CM | POA: Diagnosis not present

## 2012-11-16 ENCOUNTER — Encounter: Payer: Self-pay | Admitting: Family Medicine

## 2012-11-16 ENCOUNTER — Ambulatory Visit (INDEPENDENT_AMBULATORY_CARE_PROVIDER_SITE_OTHER): Payer: Medicare Other | Admitting: Family Medicine

## 2012-11-16 VITALS — BP 160/88 | HR 73 | Temp 97.5°F | Wt 114.5 lb

## 2012-11-16 DIAGNOSIS — K219 Gastro-esophageal reflux disease without esophagitis: Secondary | ICD-10-CM

## 2012-11-16 DIAGNOSIS — R269 Unspecified abnormalities of gait and mobility: Secondary | ICD-10-CM | POA: Diagnosis not present

## 2012-11-16 DIAGNOSIS — R2689 Other abnormalities of gait and mobility: Secondary | ICD-10-CM

## 2012-11-16 MED ORDER — OMEPRAZOLE 20 MG PO CPDR: 20.0000 mg | DELAYED_RELEASE_CAPSULE | Freq: Every day | ORAL | Status: DC

## 2012-11-16 NOTE — Assessment & Plan Note (Signed)
Takes tums regularly for ESRD. Add on omeprazole 20mg  daily for 3 wks then prn. Pt agrees with plan.

## 2012-11-16 NOTE — Assessment & Plan Note (Signed)
No significant issue noticed on exam today other than fast gait - advised to slow down and will continue to monitor.

## 2012-11-16 NOTE — Patient Instructions (Addendum)
Let's try omeprazole 20mg  daily for heartburn (samples provided and med sent to pharmacy). Take it easy - rise slowly, walk slower to help prevent falls.

## 2012-11-16 NOTE — Progress Notes (Signed)
  Subjective:    Patient ID: [name], female    DOB: 1931-07-31, 79 y.o.   MRN: [id number]  HPI CC: 6 mo f/u  [name] presents today for routine f/u visit.  Regular dialysis for last 20 months.  BP elevated today - but drops during dialysis.  Takes amlodipine 5mg  on non dialysis days.  On and off bradycardia. BP Readings from Last 3 Encounters:  11/16/12 160/88  09/25/12 153/80  08/07/12 175/62    Would like to discuss 2 problems. Increased indigestion - especially in the morning.  Heartburn and reflux.  Takes tums with meals. Interested in stronger medicine for this.   Increased staggering noted since starting dialysis.  Seems to be getting.  Chronic issue.  Denies premonitory sxs like dizziness, lightheaded, presyncope or vertigo.  No chest pain, tightness or palpitations.  Mood overall stable on paxil. Pending eye surgery next month.  Past Medical History  Diagnosis Date  . Type 2 diabetes with nephropathy     resolved with weight loss  . Hyperlipidemia   . Hypertension   . Anxiety   . Facial droop 1935    acquired during forceps delivery, some residual R visual loss  . Anemia in chronic kidney disease     IV iron infusion  . Hx of breast cancer 2004  . Arthritis     mild in lower back  . End stage renal disease on dialysis     HD M,W,F [facility]  . GERD (gastroesophageal reflux disease)   . Cardiac murmur     a. thought due to AVF (2D echo 06/2012 without significant valvular disease).   . Bradycardia     a. Noted 06/2012.  Marland Kitchen Hypotension     a. Associated w/ dialysis.     Review of Systems Per HPI    Objective:   Physical Exam  Nursing note and vitals reviewed. Constitutional: She appears well-developed and well-nourished. No distress.  HENT:  Head: Normocephalic and atraumatic.  Mouth/Throat: Oropharynx is clear and moist. No oropharyngeal exudate.  Eyes:  R vision loss  Neck: Normal range of motion. Neck supple. Carotid bruit is not  present (referred bruit from fistula).  Cardiovascular: Normal rate, regular rhythm and intact distal pulses.   Murmur (referred harsh flow murmur from fistula) heard. Musculoskeletal:  R brachiocephalic fistula with palpable thrill  Neurological:  Stance and gait preserved. Fast gait noted with ambulation       Assessment & Plan:

## 2012-11-17 DIAGNOSIS — D509 Iron deficiency anemia, unspecified: Secondary | ICD-10-CM | POA: Diagnosis not present

## 2012-11-17 DIAGNOSIS — E1129 Type 2 diabetes mellitus with other diabetic kidney complication: Secondary | ICD-10-CM | POA: Diagnosis not present

## 2012-11-17 DIAGNOSIS — N186 End stage renal disease: Secondary | ICD-10-CM | POA: Diagnosis not present

## 2012-11-17 DIAGNOSIS — D631 Anemia in chronic kidney disease: Secondary | ICD-10-CM | POA: Diagnosis not present

## 2012-11-20 DIAGNOSIS — E1129 Type 2 diabetes mellitus with other diabetic kidney complication: Secondary | ICD-10-CM | POA: Diagnosis not present

## 2012-11-20 DIAGNOSIS — D509 Iron deficiency anemia, unspecified: Secondary | ICD-10-CM | POA: Diagnosis not present

## 2012-11-20 DIAGNOSIS — D631 Anemia in chronic kidney disease: Secondary | ICD-10-CM | POA: Diagnosis not present

## 2012-11-20 DIAGNOSIS — N186 End stage renal disease: Secondary | ICD-10-CM | POA: Diagnosis not present

## 2012-11-22 DIAGNOSIS — E1129 Type 2 diabetes mellitus with other diabetic kidney complication: Secondary | ICD-10-CM | POA: Diagnosis not present

## 2012-11-22 DIAGNOSIS — D509 Iron deficiency anemia, unspecified: Secondary | ICD-10-CM | POA: Diagnosis not present

## 2012-11-22 DIAGNOSIS — D631 Anemia in chronic kidney disease: Secondary | ICD-10-CM | POA: Diagnosis not present

## 2012-11-22 DIAGNOSIS — N186 End stage renal disease: Secondary | ICD-10-CM | POA: Diagnosis not present

## 2012-11-24 DIAGNOSIS — E1129 Type 2 diabetes mellitus with other diabetic kidney complication: Secondary | ICD-10-CM | POA: Diagnosis not present

## 2012-11-24 DIAGNOSIS — D509 Iron deficiency anemia, unspecified: Secondary | ICD-10-CM | POA: Diagnosis not present

## 2012-11-24 DIAGNOSIS — N186 End stage renal disease: Secondary | ICD-10-CM | POA: Diagnosis not present

## 2012-11-24 DIAGNOSIS — D631 Anemia in chronic kidney disease: Secondary | ICD-10-CM | POA: Diagnosis not present

## 2012-11-26 DIAGNOSIS — N186 End stage renal disease: Secondary | ICD-10-CM | POA: Diagnosis not present

## 2012-11-27 DIAGNOSIS — N186 End stage renal disease: Secondary | ICD-10-CM | POA: Diagnosis not present

## 2012-11-27 DIAGNOSIS — D509 Iron deficiency anemia, unspecified: Secondary | ICD-10-CM | POA: Diagnosis not present

## 2012-11-27 DIAGNOSIS — D631 Anemia in chronic kidney disease: Secondary | ICD-10-CM | POA: Diagnosis not present

## 2012-11-27 DIAGNOSIS — N2581 Secondary hyperparathyroidism of renal origin: Secondary | ICD-10-CM | POA: Diagnosis not present

## 2012-11-27 DIAGNOSIS — E785 Hyperlipidemia, unspecified: Secondary | ICD-10-CM | POA: Diagnosis not present

## 2012-11-27 DIAGNOSIS — E1129 Type 2 diabetes mellitus with other diabetic kidney complication: Secondary | ICD-10-CM | POA: Diagnosis not present

## 2012-11-27 DIAGNOSIS — H02102 Unspecified ectropion of right lower eyelid: Secondary | ICD-10-CM

## 2012-11-27 HISTORY — DX: Unspecified ectropion of right lower eyelid: H02.102

## 2012-11-29 DIAGNOSIS — N2581 Secondary hyperparathyroidism of renal origin: Secondary | ICD-10-CM | POA: Diagnosis not present

## 2012-11-29 DIAGNOSIS — E1129 Type 2 diabetes mellitus with other diabetic kidney complication: Secondary | ICD-10-CM | POA: Diagnosis not present

## 2012-11-29 DIAGNOSIS — N186 End stage renal disease: Secondary | ICD-10-CM | POA: Diagnosis not present

## 2012-11-29 DIAGNOSIS — D509 Iron deficiency anemia, unspecified: Secondary | ICD-10-CM | POA: Diagnosis not present

## 2012-11-29 DIAGNOSIS — D631 Anemia in chronic kidney disease: Secondary | ICD-10-CM | POA: Diagnosis not present

## 2012-11-29 DIAGNOSIS — E785 Hyperlipidemia, unspecified: Secondary | ICD-10-CM | POA: Diagnosis not present

## 2012-11-30 DIAGNOSIS — E785 Hyperlipidemia, unspecified: Secondary | ICD-10-CM | POA: Diagnosis not present

## 2012-11-30 DIAGNOSIS — D631 Anemia in chronic kidney disease: Secondary | ICD-10-CM | POA: Diagnosis not present

## 2012-11-30 DIAGNOSIS — N186 End stage renal disease: Secondary | ICD-10-CM | POA: Diagnosis not present

## 2012-11-30 DIAGNOSIS — N2581 Secondary hyperparathyroidism of renal origin: Secondary | ICD-10-CM | POA: Diagnosis not present

## 2012-11-30 DIAGNOSIS — D509 Iron deficiency anemia, unspecified: Secondary | ICD-10-CM | POA: Diagnosis not present

## 2012-11-30 DIAGNOSIS — E1129 Type 2 diabetes mellitus with other diabetic kidney complication: Secondary | ICD-10-CM | POA: Diagnosis not present

## 2012-12-02 ENCOUNTER — Other Ambulatory Visit: Payer: Self-pay | Admitting: Family Medicine

## 2012-12-04 DIAGNOSIS — N186 End stage renal disease: Secondary | ICD-10-CM | POA: Diagnosis not present

## 2012-12-04 DIAGNOSIS — E785 Hyperlipidemia, unspecified: Secondary | ICD-10-CM | POA: Diagnosis not present

## 2012-12-04 DIAGNOSIS — D509 Iron deficiency anemia, unspecified: Secondary | ICD-10-CM | POA: Diagnosis not present

## 2012-12-04 DIAGNOSIS — E1129 Type 2 diabetes mellitus with other diabetic kidney complication: Secondary | ICD-10-CM | POA: Diagnosis not present

## 2012-12-04 DIAGNOSIS — N2581 Secondary hyperparathyroidism of renal origin: Secondary | ICD-10-CM | POA: Diagnosis not present

## 2012-12-04 DIAGNOSIS — D631 Anemia in chronic kidney disease: Secondary | ICD-10-CM | POA: Diagnosis not present

## 2012-12-06 DIAGNOSIS — D509 Iron deficiency anemia, unspecified: Secondary | ICD-10-CM | POA: Diagnosis not present

## 2012-12-06 DIAGNOSIS — E1129 Type 2 diabetes mellitus with other diabetic kidney complication: Secondary | ICD-10-CM | POA: Diagnosis not present

## 2012-12-06 DIAGNOSIS — D631 Anemia in chronic kidney disease: Secondary | ICD-10-CM | POA: Diagnosis not present

## 2012-12-06 DIAGNOSIS — N2581 Secondary hyperparathyroidism of renal origin: Secondary | ICD-10-CM | POA: Diagnosis not present

## 2012-12-06 DIAGNOSIS — E785 Hyperlipidemia, unspecified: Secondary | ICD-10-CM | POA: Diagnosis not present

## 2012-12-06 DIAGNOSIS — N186 End stage renal disease: Secondary | ICD-10-CM | POA: Diagnosis not present

## 2012-12-07 DIAGNOSIS — H02109 Unspecified ectropion of unspecified eye, unspecified eyelid: Secondary | ICD-10-CM | POA: Diagnosis not present

## 2012-12-08 ENCOUNTER — Encounter: Payer: Self-pay | Admitting: Family Medicine

## 2012-12-08 DIAGNOSIS — D631 Anemia in chronic kidney disease: Secondary | ICD-10-CM | POA: Diagnosis not present

## 2012-12-08 DIAGNOSIS — E785 Hyperlipidemia, unspecified: Secondary | ICD-10-CM | POA: Diagnosis not present

## 2012-12-08 DIAGNOSIS — E1129 Type 2 diabetes mellitus with other diabetic kidney complication: Secondary | ICD-10-CM | POA: Diagnosis not present

## 2012-12-08 DIAGNOSIS — D509 Iron deficiency anemia, unspecified: Secondary | ICD-10-CM | POA: Diagnosis not present

## 2012-12-08 DIAGNOSIS — N2581 Secondary hyperparathyroidism of renal origin: Secondary | ICD-10-CM | POA: Diagnosis not present

## 2012-12-08 DIAGNOSIS — N186 End stage renal disease: Secondary | ICD-10-CM | POA: Diagnosis not present

## 2012-12-11 DIAGNOSIS — E1129 Type 2 diabetes mellitus with other diabetic kidney complication: Secondary | ICD-10-CM | POA: Diagnosis not present

## 2012-12-11 DIAGNOSIS — E785 Hyperlipidemia, unspecified: Secondary | ICD-10-CM | POA: Diagnosis not present

## 2012-12-11 DIAGNOSIS — N186 End stage renal disease: Secondary | ICD-10-CM | POA: Diagnosis not present

## 2012-12-11 DIAGNOSIS — N2581 Secondary hyperparathyroidism of renal origin: Secondary | ICD-10-CM | POA: Diagnosis not present

## 2012-12-11 DIAGNOSIS — D631 Anemia in chronic kidney disease: Secondary | ICD-10-CM | POA: Diagnosis not present

## 2012-12-11 DIAGNOSIS — D509 Iron deficiency anemia, unspecified: Secondary | ICD-10-CM | POA: Diagnosis not present

## 2012-12-13 DIAGNOSIS — E785 Hyperlipidemia, unspecified: Secondary | ICD-10-CM | POA: Diagnosis not present

## 2012-12-13 DIAGNOSIS — E1129 Type 2 diabetes mellitus with other diabetic kidney complication: Secondary | ICD-10-CM | POA: Diagnosis not present

## 2012-12-13 DIAGNOSIS — N2581 Secondary hyperparathyroidism of renal origin: Secondary | ICD-10-CM | POA: Diagnosis not present

## 2012-12-13 DIAGNOSIS — D631 Anemia in chronic kidney disease: Secondary | ICD-10-CM | POA: Diagnosis not present

## 2012-12-13 DIAGNOSIS — N186 End stage renal disease: Secondary | ICD-10-CM | POA: Diagnosis not present

## 2012-12-13 DIAGNOSIS — D509 Iron deficiency anemia, unspecified: Secondary | ICD-10-CM | POA: Diagnosis not present

## 2012-12-15 DIAGNOSIS — N2581 Secondary hyperparathyroidism of renal origin: Secondary | ICD-10-CM | POA: Diagnosis not present

## 2012-12-15 DIAGNOSIS — E1129 Type 2 diabetes mellitus with other diabetic kidney complication: Secondary | ICD-10-CM | POA: Diagnosis not present

## 2012-12-15 DIAGNOSIS — E785 Hyperlipidemia, unspecified: Secondary | ICD-10-CM | POA: Diagnosis not present

## 2012-12-15 DIAGNOSIS — D631 Anemia in chronic kidney disease: Secondary | ICD-10-CM | POA: Diagnosis not present

## 2012-12-15 DIAGNOSIS — N186 End stage renal disease: Secondary | ICD-10-CM | POA: Diagnosis not present

## 2012-12-15 DIAGNOSIS — D509 Iron deficiency anemia, unspecified: Secondary | ICD-10-CM | POA: Diagnosis not present

## 2012-12-18 DIAGNOSIS — N186 End stage renal disease: Secondary | ICD-10-CM | POA: Diagnosis not present

## 2012-12-18 DIAGNOSIS — D509 Iron deficiency anemia, unspecified: Secondary | ICD-10-CM | POA: Diagnosis not present

## 2012-12-18 DIAGNOSIS — E785 Hyperlipidemia, unspecified: Secondary | ICD-10-CM | POA: Diagnosis not present

## 2012-12-18 DIAGNOSIS — D631 Anemia in chronic kidney disease: Secondary | ICD-10-CM | POA: Diagnosis not present

## 2012-12-18 DIAGNOSIS — N2581 Secondary hyperparathyroidism of renal origin: Secondary | ICD-10-CM | POA: Diagnosis not present

## 2012-12-18 DIAGNOSIS — E1129 Type 2 diabetes mellitus with other diabetic kidney complication: Secondary | ICD-10-CM | POA: Diagnosis not present

## 2012-12-20 DIAGNOSIS — N186 End stage renal disease: Secondary | ICD-10-CM | POA: Diagnosis not present

## 2012-12-20 DIAGNOSIS — D509 Iron deficiency anemia, unspecified: Secondary | ICD-10-CM | POA: Diagnosis not present

## 2012-12-20 DIAGNOSIS — D631 Anemia in chronic kidney disease: Secondary | ICD-10-CM | POA: Diagnosis not present

## 2012-12-20 DIAGNOSIS — E1129 Type 2 diabetes mellitus with other diabetic kidney complication: Secondary | ICD-10-CM | POA: Diagnosis not present

## 2012-12-20 DIAGNOSIS — E785 Hyperlipidemia, unspecified: Secondary | ICD-10-CM | POA: Diagnosis not present

## 2012-12-20 DIAGNOSIS — N2581 Secondary hyperparathyroidism of renal origin: Secondary | ICD-10-CM | POA: Diagnosis not present

## 2012-12-22 DIAGNOSIS — D631 Anemia in chronic kidney disease: Secondary | ICD-10-CM | POA: Diagnosis not present

## 2012-12-22 DIAGNOSIS — E785 Hyperlipidemia, unspecified: Secondary | ICD-10-CM | POA: Diagnosis not present

## 2012-12-22 DIAGNOSIS — D509 Iron deficiency anemia, unspecified: Secondary | ICD-10-CM | POA: Diagnosis not present

## 2012-12-22 DIAGNOSIS — N186 End stage renal disease: Secondary | ICD-10-CM | POA: Diagnosis not present

## 2012-12-22 DIAGNOSIS — N2581 Secondary hyperparathyroidism of renal origin: Secondary | ICD-10-CM | POA: Diagnosis not present

## 2012-12-22 DIAGNOSIS — E1129 Type 2 diabetes mellitus with other diabetic kidney complication: Secondary | ICD-10-CM | POA: Diagnosis not present

## 2012-12-25 DIAGNOSIS — D509 Iron deficiency anemia, unspecified: Secondary | ICD-10-CM | POA: Diagnosis not present

## 2012-12-25 DIAGNOSIS — N2581 Secondary hyperparathyroidism of renal origin: Secondary | ICD-10-CM | POA: Diagnosis not present

## 2012-12-25 DIAGNOSIS — N186 End stage renal disease: Secondary | ICD-10-CM | POA: Diagnosis not present

## 2012-12-25 DIAGNOSIS — D631 Anemia in chronic kidney disease: Secondary | ICD-10-CM | POA: Diagnosis not present

## 2012-12-25 DIAGNOSIS — E1129 Type 2 diabetes mellitus with other diabetic kidney complication: Secondary | ICD-10-CM | POA: Diagnosis not present

## 2012-12-25 DIAGNOSIS — E785 Hyperlipidemia, unspecified: Secondary | ICD-10-CM | POA: Diagnosis not present

## 2012-12-26 DIAGNOSIS — N186 End stage renal disease: Secondary | ICD-10-CM | POA: Diagnosis not present

## 2012-12-27 DIAGNOSIS — D631 Anemia in chronic kidney disease: Secondary | ICD-10-CM | POA: Diagnosis not present

## 2012-12-27 DIAGNOSIS — N186 End stage renal disease: Secondary | ICD-10-CM | POA: Diagnosis not present

## 2012-12-27 DIAGNOSIS — Z23 Encounter for immunization: Secondary | ICD-10-CM | POA: Diagnosis not present

## 2012-12-27 DIAGNOSIS — D509 Iron deficiency anemia, unspecified: Secondary | ICD-10-CM | POA: Diagnosis not present

## 2012-12-27 DIAGNOSIS — E1129 Type 2 diabetes mellitus with other diabetic kidney complication: Secondary | ICD-10-CM | POA: Diagnosis not present

## 2012-12-29 DIAGNOSIS — E1129 Type 2 diabetes mellitus with other diabetic kidney complication: Secondary | ICD-10-CM | POA: Diagnosis not present

## 2012-12-29 DIAGNOSIS — D509 Iron deficiency anemia, unspecified: Secondary | ICD-10-CM | POA: Diagnosis not present

## 2012-12-29 DIAGNOSIS — D631 Anemia in chronic kidney disease: Secondary | ICD-10-CM | POA: Diagnosis not present

## 2012-12-29 DIAGNOSIS — N186 End stage renal disease: Secondary | ICD-10-CM | POA: Diagnosis not present

## 2012-12-29 DIAGNOSIS — Z23 Encounter for immunization: Secondary | ICD-10-CM | POA: Diagnosis not present

## 2013-01-01 DIAGNOSIS — Z23 Encounter for immunization: Secondary | ICD-10-CM | POA: Diagnosis not present

## 2013-01-01 DIAGNOSIS — D509 Iron deficiency anemia, unspecified: Secondary | ICD-10-CM | POA: Diagnosis not present

## 2013-01-01 DIAGNOSIS — D631 Anemia in chronic kidney disease: Secondary | ICD-10-CM | POA: Diagnosis not present

## 2013-01-01 DIAGNOSIS — N186 End stage renal disease: Secondary | ICD-10-CM | POA: Diagnosis not present

## 2013-01-01 DIAGNOSIS — E1129 Type 2 diabetes mellitus with other diabetic kidney complication: Secondary | ICD-10-CM | POA: Diagnosis not present

## 2013-01-03 DIAGNOSIS — D509 Iron deficiency anemia, unspecified: Secondary | ICD-10-CM | POA: Diagnosis not present

## 2013-01-03 DIAGNOSIS — N186 End stage renal disease: Secondary | ICD-10-CM | POA: Diagnosis not present

## 2013-01-03 DIAGNOSIS — Z23 Encounter for immunization: Secondary | ICD-10-CM | POA: Diagnosis not present

## 2013-01-03 DIAGNOSIS — E1129 Type 2 diabetes mellitus with other diabetic kidney complication: Secondary | ICD-10-CM | POA: Diagnosis not present

## 2013-01-03 DIAGNOSIS — D631 Anemia in chronic kidney disease: Secondary | ICD-10-CM | POA: Diagnosis not present

## 2013-01-05 DIAGNOSIS — D631 Anemia in chronic kidney disease: Secondary | ICD-10-CM | POA: Diagnosis not present

## 2013-01-05 DIAGNOSIS — N186 End stage renal disease: Secondary | ICD-10-CM | POA: Diagnosis not present

## 2013-01-05 DIAGNOSIS — E1129 Type 2 diabetes mellitus with other diabetic kidney complication: Secondary | ICD-10-CM | POA: Diagnosis not present

## 2013-01-05 DIAGNOSIS — Z23 Encounter for immunization: Secondary | ICD-10-CM | POA: Diagnosis not present

## 2013-01-05 DIAGNOSIS — D509 Iron deficiency anemia, unspecified: Secondary | ICD-10-CM | POA: Diagnosis not present

## 2013-01-08 DIAGNOSIS — N186 End stage renal disease: Secondary | ICD-10-CM | POA: Diagnosis not present

## 2013-01-08 DIAGNOSIS — Z23 Encounter for immunization: Secondary | ICD-10-CM | POA: Diagnosis not present

## 2013-01-08 DIAGNOSIS — D509 Iron deficiency anemia, unspecified: Secondary | ICD-10-CM | POA: Diagnosis not present

## 2013-01-08 DIAGNOSIS — D631 Anemia in chronic kidney disease: Secondary | ICD-10-CM | POA: Diagnosis not present

## 2013-01-08 DIAGNOSIS — E1129 Type 2 diabetes mellitus with other diabetic kidney complication: Secondary | ICD-10-CM | POA: Diagnosis not present

## 2013-01-10 DIAGNOSIS — Z23 Encounter for immunization: Secondary | ICD-10-CM | POA: Diagnosis not present

## 2013-01-10 DIAGNOSIS — E1129 Type 2 diabetes mellitus with other diabetic kidney complication: Secondary | ICD-10-CM | POA: Diagnosis not present

## 2013-01-10 DIAGNOSIS — D509 Iron deficiency anemia, unspecified: Secondary | ICD-10-CM | POA: Diagnosis not present

## 2013-01-10 DIAGNOSIS — D631 Anemia in chronic kidney disease: Secondary | ICD-10-CM | POA: Diagnosis not present

## 2013-01-10 DIAGNOSIS — N186 End stage renal disease: Secondary | ICD-10-CM | POA: Diagnosis not present

## 2013-01-12 DIAGNOSIS — Z23 Encounter for immunization: Secondary | ICD-10-CM | POA: Diagnosis not present

## 2013-01-12 DIAGNOSIS — D509 Iron deficiency anemia, unspecified: Secondary | ICD-10-CM | POA: Diagnosis not present

## 2013-01-12 DIAGNOSIS — N186 End stage renal disease: Secondary | ICD-10-CM | POA: Diagnosis not present

## 2013-01-12 DIAGNOSIS — E1129 Type 2 diabetes mellitus with other diabetic kidney complication: Secondary | ICD-10-CM | POA: Diagnosis not present

## 2013-01-12 DIAGNOSIS — D631 Anemia in chronic kidney disease: Secondary | ICD-10-CM | POA: Diagnosis not present

## 2013-01-15 DIAGNOSIS — D509 Iron deficiency anemia, unspecified: Secondary | ICD-10-CM | POA: Diagnosis not present

## 2013-01-15 DIAGNOSIS — E1129 Type 2 diabetes mellitus with other diabetic kidney complication: Secondary | ICD-10-CM | POA: Diagnosis not present

## 2013-01-15 DIAGNOSIS — D631 Anemia in chronic kidney disease: Secondary | ICD-10-CM | POA: Diagnosis not present

## 2013-01-15 DIAGNOSIS — Z23 Encounter for immunization: Secondary | ICD-10-CM | POA: Diagnosis not present

## 2013-01-15 DIAGNOSIS — N186 End stage renal disease: Secondary | ICD-10-CM | POA: Diagnosis not present

## 2013-01-17 DIAGNOSIS — E1129 Type 2 diabetes mellitus with other diabetic kidney complication: Secondary | ICD-10-CM | POA: Diagnosis not present

## 2013-01-17 DIAGNOSIS — Z23 Encounter for immunization: Secondary | ICD-10-CM | POA: Diagnosis not present

## 2013-01-17 DIAGNOSIS — N186 End stage renal disease: Secondary | ICD-10-CM | POA: Diagnosis not present

## 2013-01-17 DIAGNOSIS — D631 Anemia in chronic kidney disease: Secondary | ICD-10-CM | POA: Diagnosis not present

## 2013-01-17 DIAGNOSIS — D509 Iron deficiency anemia, unspecified: Secondary | ICD-10-CM | POA: Diagnosis not present

## 2013-01-19 DIAGNOSIS — Z23 Encounter for immunization: Secondary | ICD-10-CM | POA: Diagnosis not present

## 2013-01-19 DIAGNOSIS — E1129 Type 2 diabetes mellitus with other diabetic kidney complication: Secondary | ICD-10-CM | POA: Diagnosis not present

## 2013-01-19 DIAGNOSIS — N186 End stage renal disease: Secondary | ICD-10-CM | POA: Diagnosis not present

## 2013-01-19 DIAGNOSIS — D631 Anemia in chronic kidney disease: Secondary | ICD-10-CM | POA: Diagnosis not present

## 2013-01-19 DIAGNOSIS — D509 Iron deficiency anemia, unspecified: Secondary | ICD-10-CM | POA: Diagnosis not present

## 2013-01-22 DIAGNOSIS — D631 Anemia in chronic kidney disease: Secondary | ICD-10-CM | POA: Diagnosis not present

## 2013-01-22 DIAGNOSIS — N186 End stage renal disease: Secondary | ICD-10-CM | POA: Diagnosis not present

## 2013-01-22 DIAGNOSIS — D509 Iron deficiency anemia, unspecified: Secondary | ICD-10-CM | POA: Diagnosis not present

## 2013-01-22 DIAGNOSIS — E1129 Type 2 diabetes mellitus with other diabetic kidney complication: Secondary | ICD-10-CM | POA: Diagnosis not present

## 2013-01-22 DIAGNOSIS — Z23 Encounter for immunization: Secondary | ICD-10-CM | POA: Diagnosis not present

## 2013-01-24 DIAGNOSIS — Z23 Encounter for immunization: Secondary | ICD-10-CM | POA: Diagnosis not present

## 2013-01-24 DIAGNOSIS — D631 Anemia in chronic kidney disease: Secondary | ICD-10-CM | POA: Diagnosis not present

## 2013-01-24 DIAGNOSIS — D509 Iron deficiency anemia, unspecified: Secondary | ICD-10-CM | POA: Diagnosis not present

## 2013-01-24 DIAGNOSIS — E1129 Type 2 diabetes mellitus with other diabetic kidney complication: Secondary | ICD-10-CM | POA: Diagnosis not present

## 2013-01-24 DIAGNOSIS — N186 End stage renal disease: Secondary | ICD-10-CM | POA: Diagnosis not present

## 2013-01-26 DIAGNOSIS — D631 Anemia in chronic kidney disease: Secondary | ICD-10-CM | POA: Diagnosis not present

## 2013-01-26 DIAGNOSIS — N186 End stage renal disease: Secondary | ICD-10-CM | POA: Diagnosis not present

## 2013-01-26 DIAGNOSIS — E1129 Type 2 diabetes mellitus with other diabetic kidney complication: Secondary | ICD-10-CM | POA: Diagnosis not present

## 2013-01-26 DIAGNOSIS — Z23 Encounter for immunization: Secondary | ICD-10-CM | POA: Diagnosis not present

## 2013-01-26 DIAGNOSIS — D509 Iron deficiency anemia, unspecified: Secondary | ICD-10-CM | POA: Diagnosis not present

## 2013-01-29 DIAGNOSIS — D509 Iron deficiency anemia, unspecified: Secondary | ICD-10-CM | POA: Diagnosis not present

## 2013-01-29 DIAGNOSIS — D631 Anemia in chronic kidney disease: Secondary | ICD-10-CM | POA: Diagnosis not present

## 2013-01-29 DIAGNOSIS — N186 End stage renal disease: Secondary | ICD-10-CM | POA: Diagnosis not present

## 2013-01-29 DIAGNOSIS — E1129 Type 2 diabetes mellitus with other diabetic kidney complication: Secondary | ICD-10-CM | POA: Diagnosis not present

## 2013-01-31 DIAGNOSIS — D509 Iron deficiency anemia, unspecified: Secondary | ICD-10-CM | POA: Diagnosis not present

## 2013-01-31 DIAGNOSIS — E1129 Type 2 diabetes mellitus with other diabetic kidney complication: Secondary | ICD-10-CM | POA: Diagnosis not present

## 2013-01-31 DIAGNOSIS — N186 End stage renal disease: Secondary | ICD-10-CM | POA: Diagnosis not present

## 2013-01-31 DIAGNOSIS — D631 Anemia in chronic kidney disease: Secondary | ICD-10-CM | POA: Diagnosis not present

## 2013-02-02 DIAGNOSIS — D509 Iron deficiency anemia, unspecified: Secondary | ICD-10-CM | POA: Diagnosis not present

## 2013-02-02 DIAGNOSIS — N186 End stage renal disease: Secondary | ICD-10-CM | POA: Diagnosis not present

## 2013-02-02 DIAGNOSIS — E1129 Type 2 diabetes mellitus with other diabetic kidney complication: Secondary | ICD-10-CM | POA: Diagnosis not present

## 2013-02-02 DIAGNOSIS — D631 Anemia in chronic kidney disease: Secondary | ICD-10-CM | POA: Diagnosis not present

## 2013-02-05 DIAGNOSIS — E1129 Type 2 diabetes mellitus with other diabetic kidney complication: Secondary | ICD-10-CM | POA: Diagnosis not present

## 2013-02-05 DIAGNOSIS — N186 End stage renal disease: Secondary | ICD-10-CM | POA: Diagnosis not present

## 2013-02-05 DIAGNOSIS — D631 Anemia in chronic kidney disease: Secondary | ICD-10-CM | POA: Diagnosis not present

## 2013-02-05 DIAGNOSIS — D509 Iron deficiency anemia, unspecified: Secondary | ICD-10-CM | POA: Diagnosis not present

## 2013-02-07 DIAGNOSIS — D631 Anemia in chronic kidney disease: Secondary | ICD-10-CM | POA: Diagnosis not present

## 2013-02-07 DIAGNOSIS — E1129 Type 2 diabetes mellitus with other diabetic kidney complication: Secondary | ICD-10-CM | POA: Diagnosis not present

## 2013-02-07 DIAGNOSIS — N186 End stage renal disease: Secondary | ICD-10-CM | POA: Diagnosis not present

## 2013-02-07 DIAGNOSIS — D509 Iron deficiency anemia, unspecified: Secondary | ICD-10-CM | POA: Diagnosis not present

## 2013-02-09 DIAGNOSIS — N186 End stage renal disease: Secondary | ICD-10-CM | POA: Diagnosis not present

## 2013-02-09 DIAGNOSIS — D509 Iron deficiency anemia, unspecified: Secondary | ICD-10-CM | POA: Diagnosis not present

## 2013-02-09 DIAGNOSIS — D631 Anemia in chronic kidney disease: Secondary | ICD-10-CM | POA: Diagnosis not present

## 2013-02-09 DIAGNOSIS — E1129 Type 2 diabetes mellitus with other diabetic kidney complication: Secondary | ICD-10-CM | POA: Diagnosis not present

## 2013-02-12 DIAGNOSIS — D631 Anemia in chronic kidney disease: Secondary | ICD-10-CM | POA: Diagnosis not present

## 2013-02-12 DIAGNOSIS — N186 End stage renal disease: Secondary | ICD-10-CM | POA: Diagnosis not present

## 2013-02-12 DIAGNOSIS — E1129 Type 2 diabetes mellitus with other diabetic kidney complication: Secondary | ICD-10-CM | POA: Diagnosis not present

## 2013-02-12 DIAGNOSIS — D509 Iron deficiency anemia, unspecified: Secondary | ICD-10-CM | POA: Diagnosis not present

## 2013-02-14 DIAGNOSIS — D631 Anemia in chronic kidney disease: Secondary | ICD-10-CM | POA: Diagnosis not present

## 2013-02-14 DIAGNOSIS — E1129 Type 2 diabetes mellitus with other diabetic kidney complication: Secondary | ICD-10-CM | POA: Diagnosis not present

## 2013-02-14 DIAGNOSIS — D509 Iron deficiency anemia, unspecified: Secondary | ICD-10-CM | POA: Diagnosis not present

## 2013-02-14 DIAGNOSIS — N186 End stage renal disease: Secondary | ICD-10-CM | POA: Diagnosis not present

## 2013-02-14 LAB — CREATININE, SERUM: Creat: 6.9

## 2013-02-16 DIAGNOSIS — E1129 Type 2 diabetes mellitus with other diabetic kidney complication: Secondary | ICD-10-CM | POA: Diagnosis not present

## 2013-02-16 DIAGNOSIS — D631 Anemia in chronic kidney disease: Secondary | ICD-10-CM | POA: Diagnosis not present

## 2013-02-16 DIAGNOSIS — D509 Iron deficiency anemia, unspecified: Secondary | ICD-10-CM | POA: Diagnosis not present

## 2013-02-16 DIAGNOSIS — N186 End stage renal disease: Secondary | ICD-10-CM | POA: Diagnosis not present

## 2013-02-19 DIAGNOSIS — N186 End stage renal disease: Secondary | ICD-10-CM | POA: Diagnosis not present

## 2013-02-19 DIAGNOSIS — E1129 Type 2 diabetes mellitus with other diabetic kidney complication: Secondary | ICD-10-CM | POA: Diagnosis not present

## 2013-02-19 DIAGNOSIS — D509 Iron deficiency anemia, unspecified: Secondary | ICD-10-CM | POA: Diagnosis not present

## 2013-02-19 DIAGNOSIS — D631 Anemia in chronic kidney disease: Secondary | ICD-10-CM | POA: Diagnosis not present

## 2013-02-21 DIAGNOSIS — N186 End stage renal disease: Secondary | ICD-10-CM | POA: Diagnosis not present

## 2013-02-21 DIAGNOSIS — D631 Anemia in chronic kidney disease: Secondary | ICD-10-CM | POA: Diagnosis not present

## 2013-02-21 DIAGNOSIS — E1129 Type 2 diabetes mellitus with other diabetic kidney complication: Secondary | ICD-10-CM | POA: Diagnosis not present

## 2013-02-21 DIAGNOSIS — D509 Iron deficiency anemia, unspecified: Secondary | ICD-10-CM | POA: Diagnosis not present

## 2013-02-23 DIAGNOSIS — D631 Anemia in chronic kidney disease: Secondary | ICD-10-CM | POA: Diagnosis not present

## 2013-02-23 DIAGNOSIS — N186 End stage renal disease: Secondary | ICD-10-CM | POA: Diagnosis not present

## 2013-02-23 DIAGNOSIS — E1129 Type 2 diabetes mellitus with other diabetic kidney complication: Secondary | ICD-10-CM | POA: Diagnosis not present

## 2013-02-23 DIAGNOSIS — D509 Iron deficiency anemia, unspecified: Secondary | ICD-10-CM | POA: Diagnosis not present

## 2013-02-26 DIAGNOSIS — N186 End stage renal disease: Secondary | ICD-10-CM | POA: Diagnosis not present

## 2013-02-26 DIAGNOSIS — D509 Iron deficiency anemia, unspecified: Secondary | ICD-10-CM | POA: Diagnosis not present

## 2013-02-26 DIAGNOSIS — E785 Hyperlipidemia, unspecified: Secondary | ICD-10-CM | POA: Diagnosis not present

## 2013-02-26 DIAGNOSIS — Z23 Encounter for immunization: Secondary | ICD-10-CM | POA: Diagnosis not present

## 2013-02-26 DIAGNOSIS — D631 Anemia in chronic kidney disease: Secondary | ICD-10-CM | POA: Diagnosis not present

## 2013-02-26 DIAGNOSIS — E1129 Type 2 diabetes mellitus with other diabetic kidney complication: Secondary | ICD-10-CM | POA: Diagnosis not present

## 2013-02-26 DIAGNOSIS — N2581 Secondary hyperparathyroidism of renal origin: Secondary | ICD-10-CM | POA: Diagnosis not present

## 2013-02-28 DIAGNOSIS — Z23 Encounter for immunization: Secondary | ICD-10-CM | POA: Diagnosis not present

## 2013-02-28 DIAGNOSIS — D509 Iron deficiency anemia, unspecified: Secondary | ICD-10-CM | POA: Diagnosis not present

## 2013-02-28 DIAGNOSIS — E1129 Type 2 diabetes mellitus with other diabetic kidney complication: Secondary | ICD-10-CM | POA: Diagnosis not present

## 2013-02-28 DIAGNOSIS — N186 End stage renal disease: Secondary | ICD-10-CM | POA: Diagnosis not present

## 2013-02-28 DIAGNOSIS — N2581 Secondary hyperparathyroidism of renal origin: Secondary | ICD-10-CM | POA: Diagnosis not present

## 2013-02-28 DIAGNOSIS — D631 Anemia in chronic kidney disease: Secondary | ICD-10-CM | POA: Diagnosis not present

## 2013-03-02 DIAGNOSIS — D631 Anemia in chronic kidney disease: Secondary | ICD-10-CM | POA: Diagnosis not present

## 2013-03-02 DIAGNOSIS — N2581 Secondary hyperparathyroidism of renal origin: Secondary | ICD-10-CM | POA: Diagnosis not present

## 2013-03-02 DIAGNOSIS — D509 Iron deficiency anemia, unspecified: Secondary | ICD-10-CM | POA: Diagnosis not present

## 2013-03-02 DIAGNOSIS — Z23 Encounter for immunization: Secondary | ICD-10-CM | POA: Diagnosis not present

## 2013-03-02 DIAGNOSIS — E1129 Type 2 diabetes mellitus with other diabetic kidney complication: Secondary | ICD-10-CM | POA: Diagnosis not present

## 2013-03-02 DIAGNOSIS — N186 End stage renal disease: Secondary | ICD-10-CM | POA: Diagnosis not present

## 2013-03-05 DIAGNOSIS — Z23 Encounter for immunization: Secondary | ICD-10-CM | POA: Diagnosis not present

## 2013-03-05 DIAGNOSIS — D631 Anemia in chronic kidney disease: Secondary | ICD-10-CM | POA: Diagnosis not present

## 2013-03-05 DIAGNOSIS — E1129 Type 2 diabetes mellitus with other diabetic kidney complication: Secondary | ICD-10-CM | POA: Diagnosis not present

## 2013-03-05 DIAGNOSIS — D509 Iron deficiency anemia, unspecified: Secondary | ICD-10-CM | POA: Diagnosis not present

## 2013-03-05 DIAGNOSIS — N2581 Secondary hyperparathyroidism of renal origin: Secondary | ICD-10-CM | POA: Diagnosis not present

## 2013-03-05 DIAGNOSIS — N186 End stage renal disease: Secondary | ICD-10-CM | POA: Diagnosis not present

## 2013-03-07 DIAGNOSIS — N2581 Secondary hyperparathyroidism of renal origin: Secondary | ICD-10-CM | POA: Diagnosis not present

## 2013-03-07 DIAGNOSIS — D631 Anemia in chronic kidney disease: Secondary | ICD-10-CM | POA: Diagnosis not present

## 2013-03-07 DIAGNOSIS — D509 Iron deficiency anemia, unspecified: Secondary | ICD-10-CM | POA: Diagnosis not present

## 2013-03-07 DIAGNOSIS — Z23 Encounter for immunization: Secondary | ICD-10-CM | POA: Diagnosis not present

## 2013-03-07 DIAGNOSIS — N186 End stage renal disease: Secondary | ICD-10-CM | POA: Diagnosis not present

## 2013-03-07 DIAGNOSIS — E1129 Type 2 diabetes mellitus with other diabetic kidney complication: Secondary | ICD-10-CM | POA: Diagnosis not present

## 2013-03-09 DIAGNOSIS — E1129 Type 2 diabetes mellitus with other diabetic kidney complication: Secondary | ICD-10-CM | POA: Diagnosis not present

## 2013-03-09 DIAGNOSIS — D631 Anemia in chronic kidney disease: Secondary | ICD-10-CM | POA: Diagnosis not present

## 2013-03-09 DIAGNOSIS — D509 Iron deficiency anemia, unspecified: Secondary | ICD-10-CM | POA: Diagnosis not present

## 2013-03-09 DIAGNOSIS — Z23 Encounter for immunization: Secondary | ICD-10-CM | POA: Diagnosis not present

## 2013-03-09 DIAGNOSIS — N186 End stage renal disease: Secondary | ICD-10-CM | POA: Diagnosis not present

## 2013-03-09 DIAGNOSIS — N2581 Secondary hyperparathyroidism of renal origin: Secondary | ICD-10-CM | POA: Diagnosis not present

## 2013-03-12 DIAGNOSIS — E1129 Type 2 diabetes mellitus with other diabetic kidney complication: Secondary | ICD-10-CM | POA: Diagnosis not present

## 2013-03-12 DIAGNOSIS — N2581 Secondary hyperparathyroidism of renal origin: Secondary | ICD-10-CM | POA: Diagnosis not present

## 2013-03-12 DIAGNOSIS — D631 Anemia in chronic kidney disease: Secondary | ICD-10-CM | POA: Diagnosis not present

## 2013-03-12 DIAGNOSIS — Z23 Encounter for immunization: Secondary | ICD-10-CM | POA: Diagnosis not present

## 2013-03-12 DIAGNOSIS — D509 Iron deficiency anemia, unspecified: Secondary | ICD-10-CM | POA: Diagnosis not present

## 2013-03-12 DIAGNOSIS — N186 End stage renal disease: Secondary | ICD-10-CM | POA: Diagnosis not present

## 2013-03-14 DIAGNOSIS — N2581 Secondary hyperparathyroidism of renal origin: Secondary | ICD-10-CM | POA: Diagnosis not present

## 2013-03-14 DIAGNOSIS — D631 Anemia in chronic kidney disease: Secondary | ICD-10-CM | POA: Diagnosis not present

## 2013-03-14 DIAGNOSIS — N186 End stage renal disease: Secondary | ICD-10-CM | POA: Diagnosis not present

## 2013-03-14 DIAGNOSIS — E1129 Type 2 diabetes mellitus with other diabetic kidney complication: Secondary | ICD-10-CM | POA: Diagnosis not present

## 2013-03-14 DIAGNOSIS — Z23 Encounter for immunization: Secondary | ICD-10-CM | POA: Diagnosis not present

## 2013-03-14 DIAGNOSIS — D509 Iron deficiency anemia, unspecified: Secondary | ICD-10-CM | POA: Diagnosis not present

## 2013-03-16 DIAGNOSIS — Z23 Encounter for immunization: Secondary | ICD-10-CM | POA: Diagnosis not present

## 2013-03-16 DIAGNOSIS — N2581 Secondary hyperparathyroidism of renal origin: Secondary | ICD-10-CM | POA: Diagnosis not present

## 2013-03-16 DIAGNOSIS — N186 End stage renal disease: Secondary | ICD-10-CM | POA: Diagnosis not present

## 2013-03-16 DIAGNOSIS — D509 Iron deficiency anemia, unspecified: Secondary | ICD-10-CM | POA: Diagnosis not present

## 2013-03-16 DIAGNOSIS — D631 Anemia in chronic kidney disease: Secondary | ICD-10-CM | POA: Diagnosis not present

## 2013-03-16 DIAGNOSIS — E1129 Type 2 diabetes mellitus with other diabetic kidney complication: Secondary | ICD-10-CM | POA: Diagnosis not present

## 2013-03-18 DIAGNOSIS — N2581 Secondary hyperparathyroidism of renal origin: Secondary | ICD-10-CM | POA: Diagnosis not present

## 2013-03-18 DIAGNOSIS — D509 Iron deficiency anemia, unspecified: Secondary | ICD-10-CM | POA: Diagnosis not present

## 2013-03-18 DIAGNOSIS — Z23 Encounter for immunization: Secondary | ICD-10-CM | POA: Diagnosis not present

## 2013-03-18 DIAGNOSIS — N186 End stage renal disease: Secondary | ICD-10-CM | POA: Diagnosis not present

## 2013-03-18 DIAGNOSIS — D631 Anemia in chronic kidney disease: Secondary | ICD-10-CM | POA: Diagnosis not present

## 2013-03-18 DIAGNOSIS — E1129 Type 2 diabetes mellitus with other diabetic kidney complication: Secondary | ICD-10-CM | POA: Diagnosis not present

## 2013-03-20 ENCOUNTER — Encounter: Payer: Self-pay | Admitting: *Deleted

## 2013-03-20 DIAGNOSIS — E1129 Type 2 diabetes mellitus with other diabetic kidney complication: Secondary | ICD-10-CM | POA: Diagnosis not present

## 2013-03-20 DIAGNOSIS — N2581 Secondary hyperparathyroidism of renal origin: Secondary | ICD-10-CM | POA: Diagnosis not present

## 2013-03-20 DIAGNOSIS — Z23 Encounter for immunization: Secondary | ICD-10-CM | POA: Diagnosis not present

## 2013-03-20 DIAGNOSIS — D631 Anemia in chronic kidney disease: Secondary | ICD-10-CM | POA: Diagnosis not present

## 2013-03-20 DIAGNOSIS — D509 Iron deficiency anemia, unspecified: Secondary | ICD-10-CM | POA: Diagnosis not present

## 2013-03-20 DIAGNOSIS — N186 End stage renal disease: Secondary | ICD-10-CM | POA: Diagnosis not present

## 2013-03-23 DIAGNOSIS — N186 End stage renal disease: Secondary | ICD-10-CM | POA: Diagnosis not present

## 2013-03-23 DIAGNOSIS — Z23 Encounter for immunization: Secondary | ICD-10-CM | POA: Diagnosis not present

## 2013-03-23 DIAGNOSIS — E1129 Type 2 diabetes mellitus with other diabetic kidney complication: Secondary | ICD-10-CM | POA: Diagnosis not present

## 2013-03-23 DIAGNOSIS — D631 Anemia in chronic kidney disease: Secondary | ICD-10-CM | POA: Diagnosis not present

## 2013-03-23 DIAGNOSIS — N2581 Secondary hyperparathyroidism of renal origin: Secondary | ICD-10-CM | POA: Diagnosis not present

## 2013-03-23 DIAGNOSIS — D509 Iron deficiency anemia, unspecified: Secondary | ICD-10-CM | POA: Diagnosis not present

## 2013-03-25 DIAGNOSIS — D509 Iron deficiency anemia, unspecified: Secondary | ICD-10-CM | POA: Diagnosis not present

## 2013-03-25 DIAGNOSIS — Z23 Encounter for immunization: Secondary | ICD-10-CM | POA: Diagnosis not present

## 2013-03-25 DIAGNOSIS — E1129 Type 2 diabetes mellitus with other diabetic kidney complication: Secondary | ICD-10-CM | POA: Diagnosis not present

## 2013-03-25 DIAGNOSIS — N186 End stage renal disease: Secondary | ICD-10-CM | POA: Diagnosis not present

## 2013-03-25 DIAGNOSIS — N2581 Secondary hyperparathyroidism of renal origin: Secondary | ICD-10-CM | POA: Diagnosis not present

## 2013-03-25 DIAGNOSIS — D631 Anemia in chronic kidney disease: Secondary | ICD-10-CM | POA: Diagnosis not present

## 2013-03-27 DIAGNOSIS — Z23 Encounter for immunization: Secondary | ICD-10-CM | POA: Diagnosis not present

## 2013-03-27 DIAGNOSIS — N2581 Secondary hyperparathyroidism of renal origin: Secondary | ICD-10-CM | POA: Diagnosis not present

## 2013-03-27 DIAGNOSIS — D509 Iron deficiency anemia, unspecified: Secondary | ICD-10-CM | POA: Diagnosis not present

## 2013-03-27 DIAGNOSIS — N186 End stage renal disease: Secondary | ICD-10-CM | POA: Diagnosis not present

## 2013-03-27 DIAGNOSIS — D631 Anemia in chronic kidney disease: Secondary | ICD-10-CM | POA: Diagnosis not present

## 2013-03-27 DIAGNOSIS — E1129 Type 2 diabetes mellitus with other diabetic kidney complication: Secondary | ICD-10-CM | POA: Diagnosis not present

## 2013-03-28 DIAGNOSIS — N186 End stage renal disease: Secondary | ICD-10-CM | POA: Diagnosis not present

## 2013-03-30 DIAGNOSIS — D631 Anemia in chronic kidney disease: Secondary | ICD-10-CM | POA: Diagnosis not present

## 2013-03-30 DIAGNOSIS — E1129 Type 2 diabetes mellitus with other diabetic kidney complication: Secondary | ICD-10-CM | POA: Diagnosis not present

## 2013-03-30 DIAGNOSIS — D509 Iron deficiency anemia, unspecified: Secondary | ICD-10-CM | POA: Diagnosis not present

## 2013-03-30 DIAGNOSIS — N186 End stage renal disease: Secondary | ICD-10-CM | POA: Diagnosis not present

## 2013-04-04 DIAGNOSIS — H01009 Unspecified blepharitis unspecified eye, unspecified eyelid: Secondary | ICD-10-CM | POA: Diagnosis not present

## 2013-04-18 DIAGNOSIS — L02519 Cutaneous abscess of unspecified hand: Secondary | ICD-10-CM | POA: Diagnosis not present

## 2013-04-18 DIAGNOSIS — L03119 Cellulitis of unspecified part of limb: Secondary | ICD-10-CM | POA: Diagnosis not present

## 2013-04-28 DIAGNOSIS — N186 End stage renal disease: Secondary | ICD-10-CM | POA: Diagnosis not present

## 2013-04-30 DIAGNOSIS — E1129 Type 2 diabetes mellitus with other diabetic kidney complication: Secondary | ICD-10-CM | POA: Diagnosis not present

## 2013-04-30 DIAGNOSIS — D631 Anemia in chronic kidney disease: Secondary | ICD-10-CM | POA: Diagnosis not present

## 2013-04-30 DIAGNOSIS — N186 End stage renal disease: Secondary | ICD-10-CM | POA: Diagnosis not present

## 2013-04-30 DIAGNOSIS — D509 Iron deficiency anemia, unspecified: Secondary | ICD-10-CM | POA: Diagnosis not present

## 2013-05-11 ENCOUNTER — Other Ambulatory Visit: Payer: Self-pay | Admitting: Family Medicine

## 2013-05-14 ENCOUNTER — Other Ambulatory Visit: Payer: Self-pay | Admitting: *Deleted

## 2013-05-14 MED ORDER — OMEPRAZOLE 20 MG PO CPDR: DELAYED_RELEASE_CAPSULE | ORAL | Status: DC

## 2013-05-17 ENCOUNTER — Ambulatory Visit: Payer: Medicare Other | Admitting: Family Medicine

## 2013-05-21 ENCOUNTER — Encounter: Payer: Self-pay | Admitting: Vascular Surgery

## 2013-05-22 ENCOUNTER — Encounter: Payer: Self-pay | Admitting: Vascular Surgery

## 2013-05-22 ENCOUNTER — Other Ambulatory Visit: Payer: Self-pay | Admitting: *Deleted

## 2013-05-22 ENCOUNTER — Ambulatory Visit (INDEPENDENT_AMBULATORY_CARE_PROVIDER_SITE_OTHER): Payer: Medicare Other | Admitting: Vascular Surgery

## 2013-05-22 ENCOUNTER — Ambulatory Visit (HOSPITAL_COMMUNITY)
Admission: RE | Admit: 2013-05-22 | Discharge: 2013-05-22 | Disposition: A | Discharge status: Home / Self Care | Payer: Medicare Other | Source: Ambulatory Visit | Attending: Vascular Surgery | Admitting: Vascular Surgery

## 2013-05-22 VITALS — BP 196/97 | HR 92 | Resp 18 | Ht 59.0 in | Wt 112.0 lb

## 2013-05-22 DIAGNOSIS — N186 End stage renal disease: Secondary | ICD-10-CM | POA: Diagnosis not present

## 2013-05-22 DIAGNOSIS — T82598A Other mechanical complication of other cardiac and vascular devices and implants, initial encounter: Secondary | ICD-10-CM

## 2013-05-22 DIAGNOSIS — Z01818 Encounter for other preprocedural examination: Secondary | ICD-10-CM | POA: Diagnosis not present

## 2013-05-22 NOTE — Progress Notes (Signed)
Subjective:     Patient ID: [name], female   DOB: 1932-03-24, 80 y.o.   MRN: [id number]  HPI this 80 year old female was referred by Dr. Jimmy Footman to evaluate the right upper arm brachial-cephalic AV fistula with some problems with cannulation. Patient states that on Monday of this week she had initially good flows and then there was some infiltration as if the needle had gotten out of the vein. This does not occur all the time but has occurred in the past. Fistula has been present for many years but was revised by Dr. Oneida Alar in January 2013 with a bovine patch. She states that there have been good flows the majority of the time. She denies pain in the hand during dialysis but does have occasional numbness of the fingers which she attributes to carpal tunnel syndrome  Past Medical History  Diagnosis Date  . Type 2 diabetes with nephropathy     resolved with weight loss  . Hyperlipidemia   . Hypertension   . Anxiety   . Facial droop 1935    acquired during forceps delivery, some residual R visual loss  . Anemia in chronic kidney disease     IV iron infusion  . Hx of breast cancer 2004  . Arthritis     mild in lower back  . End stage renal disease on dialysis     HD M,W,F [facility]  . GERD (gastroesophageal reflux disease)   . Cardiac murmur     a. thought due to AVF (2D echo 06/2012 without significant valvular disease).   . Bradycardia     a. Noted 06/2012.  Marland Kitchen Hypotension     a. Associated w/ dialysis.  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 [name])    History  Substance Use Topics  . Smoking status: Former Smoker    Types: Cigarettes    Quit date: 03/29/1953  . Smokeless tobacco: Former Systems developer  . Alcohol Use: Yes     Comment: occasional    Family History  Problem Relation Age of Onset  . Heart disease Brother     Congenital  . Diabetes Brother   . Cancer Father     stomach  . Cancer Sister     female (uterus?)  . CAD Father      MI at age 29    Allergies  Allergen Reactions  . Sulfa Antibiotics Rash    Current outpatient prescriptions:amLODipine (NORVASC) 5 MG tablet, Take 5 mg by mouth as directed. Daily except do not take on dialysis days., Disp: , Rfl: ;  omeprazole (PRILOSEC) 20 MG capsule, TAKE ONE CAPSULE BY MOUTH EVERY DAY, Disp: 30 capsule, Rfl: 2;  simvastatin (ZOCOR) 20 MG tablet, TAKE ONE TABLET BY MOUTH AT BEDTIME, Disp: 30 tablet, Rfl: 11;  aspirin EC 81 MG tablet, Take 81 mg by mouth at bedtime. , Disp: , Rfl:  B Complex-C-Folic Acid (RENA-VITE PO), Take 1 tablet by mouth daily with breakfast. , Disp: , Rfl: ;  Calcium Carbonate Antacid (TUMS ULTRA 1000 PO), Take 2,000 mg by mouth 3 (three) times daily with meals. , Disp: , Rfl: ;  Carboxymethylcellulose Sodium (REFRESH OP), Place 1 application into the right eye 2 (two) times daily as needed. For pain and dryness in left eye, Disp: , Rfl:  PARoxetine (PAXIL) 10 MG tablet, Take 1 tablet (10 mg total) by mouth every morning., Disp: 30 tablet, Rfl: 6  BP 196/97  Pulse 92  Resp 18  Ht 4\' 11"  (1.499 m)  Wt 112 lb (50.803 kg)  BMI 22.61 kg/m2  Body mass index is 22.61 kg/(m^2).           Review of Systems     Objective:   Physical Exam BP 196/97  Pulse 92  Resp 18  Ht 4\' 11"  (1.499 m)  Wt 112 lb (50.803 kg)  BMI 22.61 kg/m2  Gen. alert female no apparent distress Right upper extremity examined. There is an AV fistula in the brachial left cephalic area. There is moderate amount of ecchymosis in the proximal upper arm from recent infiltration. There is no large hematoma. There is an excellent pulse and palpable thrill in the fistula. It does seem to have a tortuous course. 1+ radial pulse palpable distally.  Today I ordered a duplex scan of the fistula which I reviewed and interpreted. Fistula is widely patent with good flows. There is no definite area of narrowing in the fistula. The fistula is quite tortuous particularly in the distal  half of the upper arm.      Assessment:     Functioning right brachial cephalic AV fistula with recent infiltration-fistula is quite tortuous on fistulogram with duplex scanning but good flows are present and no areas of stenosis    Plan:     Would recommend altering cannulation sites since there is tortuosity it will have potential for infiltration. No way to revise this fistula since there are excellent flows. The fistula is deemed not acceptable we'll need to look for other sites of access

## 2013-05-26 DIAGNOSIS — N186 End stage renal disease: Secondary | ICD-10-CM | POA: Diagnosis not present

## 2013-05-28 DIAGNOSIS — D631 Anemia in chronic kidney disease: Secondary | ICD-10-CM | POA: Diagnosis not present

## 2013-05-28 DIAGNOSIS — E1129 Type 2 diabetes mellitus with other diabetic kidney complication: Secondary | ICD-10-CM | POA: Diagnosis not present

## 2013-05-28 DIAGNOSIS — D509 Iron deficiency anemia, unspecified: Secondary | ICD-10-CM | POA: Diagnosis not present

## 2013-05-28 DIAGNOSIS — N186 End stage renal disease: Secondary | ICD-10-CM | POA: Diagnosis not present

## 2013-05-28 DIAGNOSIS — N2581 Secondary hyperparathyroidism of renal origin: Secondary | ICD-10-CM | POA: Diagnosis not present

## 2013-05-28 DIAGNOSIS — E785 Hyperlipidemia, unspecified: Secondary | ICD-10-CM | POA: Diagnosis not present

## 2013-05-28 DIAGNOSIS — N039 Chronic nephritic syndrome with unspecified morphologic changes: Secondary | ICD-10-CM | POA: Diagnosis not present

## 2013-05-30 DIAGNOSIS — N2581 Secondary hyperparathyroidism of renal origin: Secondary | ICD-10-CM | POA: Diagnosis not present

## 2013-05-30 DIAGNOSIS — N039 Chronic nephritic syndrome with unspecified morphologic changes: Secondary | ICD-10-CM | POA: Diagnosis not present

## 2013-05-30 DIAGNOSIS — D631 Anemia in chronic kidney disease: Secondary | ICD-10-CM | POA: Diagnosis not present

## 2013-05-30 DIAGNOSIS — N186 End stage renal disease: Secondary | ICD-10-CM | POA: Diagnosis not present

## 2013-05-30 DIAGNOSIS — D509 Iron deficiency anemia, unspecified: Secondary | ICD-10-CM | POA: Diagnosis not present

## 2013-05-30 DIAGNOSIS — E1129 Type 2 diabetes mellitus with other diabetic kidney complication: Secondary | ICD-10-CM | POA: Diagnosis not present

## 2013-05-30 DIAGNOSIS — E785 Hyperlipidemia, unspecified: Secondary | ICD-10-CM | POA: Diagnosis not present

## 2013-06-01 DIAGNOSIS — N039 Chronic nephritic syndrome with unspecified morphologic changes: Secondary | ICD-10-CM | POA: Diagnosis not present

## 2013-06-01 DIAGNOSIS — D631 Anemia in chronic kidney disease: Secondary | ICD-10-CM | POA: Diagnosis not present

## 2013-06-01 DIAGNOSIS — N2581 Secondary hyperparathyroidism of renal origin: Secondary | ICD-10-CM | POA: Diagnosis not present

## 2013-06-01 DIAGNOSIS — D509 Iron deficiency anemia, unspecified: Secondary | ICD-10-CM | POA: Diagnosis not present

## 2013-06-01 DIAGNOSIS — N186 End stage renal disease: Secondary | ICD-10-CM | POA: Diagnosis not present

## 2013-06-01 DIAGNOSIS — E785 Hyperlipidemia, unspecified: Secondary | ICD-10-CM | POA: Diagnosis not present

## 2013-06-01 DIAGNOSIS — E1129 Type 2 diabetes mellitus with other diabetic kidney complication: Secondary | ICD-10-CM | POA: Diagnosis not present

## 2013-06-04 DIAGNOSIS — E785 Hyperlipidemia, unspecified: Secondary | ICD-10-CM | POA: Diagnosis not present

## 2013-06-04 DIAGNOSIS — N2581 Secondary hyperparathyroidism of renal origin: Secondary | ICD-10-CM | POA: Diagnosis not present

## 2013-06-04 DIAGNOSIS — D509 Iron deficiency anemia, unspecified: Secondary | ICD-10-CM | POA: Diagnosis not present

## 2013-06-04 DIAGNOSIS — D631 Anemia in chronic kidney disease: Secondary | ICD-10-CM | POA: Diagnosis not present

## 2013-06-04 DIAGNOSIS — E1129 Type 2 diabetes mellitus with other diabetic kidney complication: Secondary | ICD-10-CM | POA: Diagnosis not present

## 2013-06-04 DIAGNOSIS — N186 End stage renal disease: Secondary | ICD-10-CM | POA: Diagnosis not present

## 2013-06-06 DIAGNOSIS — N186 End stage renal disease: Secondary | ICD-10-CM | POA: Diagnosis not present

## 2013-06-06 DIAGNOSIS — E1129 Type 2 diabetes mellitus with other diabetic kidney complication: Secondary | ICD-10-CM | POA: Diagnosis not present

## 2013-06-06 DIAGNOSIS — N2581 Secondary hyperparathyroidism of renal origin: Secondary | ICD-10-CM | POA: Diagnosis not present

## 2013-06-06 DIAGNOSIS — E785 Hyperlipidemia, unspecified: Secondary | ICD-10-CM | POA: Diagnosis not present

## 2013-06-06 DIAGNOSIS — D509 Iron deficiency anemia, unspecified: Secondary | ICD-10-CM | POA: Diagnosis not present

## 2013-06-06 DIAGNOSIS — D631 Anemia in chronic kidney disease: Secondary | ICD-10-CM | POA: Diagnosis not present

## 2013-06-07 ENCOUNTER — Other Ambulatory Visit: Payer: Self-pay | Admitting: *Deleted

## 2013-06-07 DIAGNOSIS — Z01810 Encounter for preprocedural cardiovascular examination: Secondary | ICD-10-CM

## 2013-06-07 DIAGNOSIS — N186 End stage renal disease: Secondary | ICD-10-CM

## 2013-06-08 DIAGNOSIS — D631 Anemia in chronic kidney disease: Secondary | ICD-10-CM | POA: Diagnosis not present

## 2013-06-08 DIAGNOSIS — D509 Iron deficiency anemia, unspecified: Secondary | ICD-10-CM | POA: Diagnosis not present

## 2013-06-08 DIAGNOSIS — E1129 Type 2 diabetes mellitus with other diabetic kidney complication: Secondary | ICD-10-CM | POA: Diagnosis not present

## 2013-06-08 DIAGNOSIS — E785 Hyperlipidemia, unspecified: Secondary | ICD-10-CM | POA: Diagnosis not present

## 2013-06-08 DIAGNOSIS — N186 End stage renal disease: Secondary | ICD-10-CM | POA: Diagnosis not present

## 2013-06-08 DIAGNOSIS — N2581 Secondary hyperparathyroidism of renal origin: Secondary | ICD-10-CM | POA: Diagnosis not present

## 2013-06-11 DIAGNOSIS — N2581 Secondary hyperparathyroidism of renal origin: Secondary | ICD-10-CM | POA: Diagnosis not present

## 2013-06-11 DIAGNOSIS — D631 Anemia in chronic kidney disease: Secondary | ICD-10-CM | POA: Diagnosis not present

## 2013-06-11 DIAGNOSIS — D509 Iron deficiency anemia, unspecified: Secondary | ICD-10-CM | POA: Diagnosis not present

## 2013-06-11 DIAGNOSIS — E785 Hyperlipidemia, unspecified: Secondary | ICD-10-CM | POA: Diagnosis not present

## 2013-06-11 DIAGNOSIS — N186 End stage renal disease: Secondary | ICD-10-CM | POA: Diagnosis not present

## 2013-06-11 DIAGNOSIS — E1129 Type 2 diabetes mellitus with other diabetic kidney complication: Secondary | ICD-10-CM | POA: Diagnosis not present

## 2013-06-13 DIAGNOSIS — D631 Anemia in chronic kidney disease: Secondary | ICD-10-CM | POA: Diagnosis not present

## 2013-06-13 DIAGNOSIS — N186 End stage renal disease: Secondary | ICD-10-CM | POA: Diagnosis not present

## 2013-06-13 DIAGNOSIS — D509 Iron deficiency anemia, unspecified: Secondary | ICD-10-CM | POA: Diagnosis not present

## 2013-06-13 DIAGNOSIS — E785 Hyperlipidemia, unspecified: Secondary | ICD-10-CM | POA: Diagnosis not present

## 2013-06-13 DIAGNOSIS — N2581 Secondary hyperparathyroidism of renal origin: Secondary | ICD-10-CM | POA: Diagnosis not present

## 2013-06-13 DIAGNOSIS — E1129 Type 2 diabetes mellitus with other diabetic kidney complication: Secondary | ICD-10-CM | POA: Diagnosis not present

## 2013-06-15 DIAGNOSIS — N2581 Secondary hyperparathyroidism of renal origin: Secondary | ICD-10-CM | POA: Diagnosis not present

## 2013-06-15 DIAGNOSIS — E1129 Type 2 diabetes mellitus with other diabetic kidney complication: Secondary | ICD-10-CM | POA: Diagnosis not present

## 2013-06-15 DIAGNOSIS — D509 Iron deficiency anemia, unspecified: Secondary | ICD-10-CM | POA: Diagnosis not present

## 2013-06-15 DIAGNOSIS — E785 Hyperlipidemia, unspecified: Secondary | ICD-10-CM | POA: Diagnosis not present

## 2013-06-15 DIAGNOSIS — D631 Anemia in chronic kidney disease: Secondary | ICD-10-CM | POA: Diagnosis not present

## 2013-06-15 DIAGNOSIS — N186 End stage renal disease: Secondary | ICD-10-CM | POA: Diagnosis not present

## 2013-06-18 DIAGNOSIS — D631 Anemia in chronic kidney disease: Secondary | ICD-10-CM | POA: Diagnosis not present

## 2013-06-18 DIAGNOSIS — E785 Hyperlipidemia, unspecified: Secondary | ICD-10-CM | POA: Diagnosis not present

## 2013-06-18 DIAGNOSIS — D509 Iron deficiency anemia, unspecified: Secondary | ICD-10-CM | POA: Diagnosis not present

## 2013-06-18 DIAGNOSIS — N186 End stage renal disease: Secondary | ICD-10-CM | POA: Diagnosis not present

## 2013-06-18 DIAGNOSIS — N2581 Secondary hyperparathyroidism of renal origin: Secondary | ICD-10-CM | POA: Diagnosis not present

## 2013-06-18 DIAGNOSIS — E1129 Type 2 diabetes mellitus with other diabetic kidney complication: Secondary | ICD-10-CM | POA: Diagnosis not present

## 2013-06-20 DIAGNOSIS — N2581 Secondary hyperparathyroidism of renal origin: Secondary | ICD-10-CM | POA: Diagnosis not present

## 2013-06-20 DIAGNOSIS — N186 End stage renal disease: Secondary | ICD-10-CM | POA: Diagnosis not present

## 2013-06-20 DIAGNOSIS — E785 Hyperlipidemia, unspecified: Secondary | ICD-10-CM | POA: Diagnosis not present

## 2013-06-20 DIAGNOSIS — D509 Iron deficiency anemia, unspecified: Secondary | ICD-10-CM | POA: Diagnosis not present

## 2013-06-20 DIAGNOSIS — E1129 Type 2 diabetes mellitus with other diabetic kidney complication: Secondary | ICD-10-CM | POA: Diagnosis not present

## 2013-06-20 DIAGNOSIS — D631 Anemia in chronic kidney disease: Secondary | ICD-10-CM | POA: Diagnosis not present

## 2013-06-22 ENCOUNTER — Encounter: Payer: Self-pay | Admitting: Family Medicine

## 2013-06-22 ENCOUNTER — Ambulatory Visit (INDEPENDENT_AMBULATORY_CARE_PROVIDER_SITE_OTHER): Payer: Medicare Other | Admitting: Family Medicine

## 2013-06-22 VITALS — BP 126/72 | HR 72 | Temp 98.4°F | Wt 112.5 lb

## 2013-06-22 DIAGNOSIS — D631 Anemia in chronic kidney disease: Secondary | ICD-10-CM | POA: Diagnosis not present

## 2013-06-22 DIAGNOSIS — N2581 Secondary hyperparathyroidism of renal origin: Secondary | ICD-10-CM | POA: Diagnosis not present

## 2013-06-22 DIAGNOSIS — E1129 Type 2 diabetes mellitus with other diabetic kidney complication: Secondary | ICD-10-CM | POA: Diagnosis not present

## 2013-06-22 DIAGNOSIS — J019 Acute sinusitis, unspecified: Secondary | ICD-10-CM | POA: Insufficient documentation

## 2013-06-22 DIAGNOSIS — E785 Hyperlipidemia, unspecified: Secondary | ICD-10-CM | POA: Diagnosis not present

## 2013-06-22 DIAGNOSIS — D509 Iron deficiency anemia, unspecified: Secondary | ICD-10-CM | POA: Diagnosis not present

## 2013-06-22 DIAGNOSIS — N186 End stage renal disease: Secondary | ICD-10-CM | POA: Diagnosis not present

## 2013-06-22 MED ORDER — AMOXICILLIN 250 MG PO CAPS: 250.0000 mg | ORAL_CAPSULE | Freq: Every day | ORAL | Status: DC

## 2013-06-22 MED ORDER — AMOXICILLIN 250 MG PO CAPS
250.0000 mg | ORAL_CAPSULE | Freq: Every day | ORAL | Status: DC
Start: 2013-06-22 — End: 2013-07-16

## 2013-06-22 NOTE — Progress Notes (Signed)
Pre visit review using our clinic review tool, if applicable. No additional management support is needed unless otherwise documented below in the visit note. 

## 2013-06-22 NOTE — Patient Instructions (Signed)
I think you have sinus infection - treat with amoxicillin 250mg  once daily for next 7 days.  Let me know if not improving with this treatment. May use tylenol as needed. Push fluids and plenty of rest.

## 2013-06-22 NOTE — Progress Notes (Signed)
BP 126/72  Pulse 72  Temp(Src) 98.4 F (36.9 C) (Oral)  Wt 112 lb 8 oz (51.03 kg)  SpO2 97%   CC: URI  Subjective:    Patient ID: [name], female    DOB: 15-Mar-1932, 80 y.o.   MRN: [id number]  HPI: [name] is a 80 y.o. female presenting on 06/22/2013 for URI   [name] recently had brother and sister in law die in last few months.  [name] takes some comfort in the fact that he passed quickly and comfortably.    [name] presents today with 10 d h/o cough along with sinus congestion, PNdrainage and marked sore throat.  Cough still present.  Dry cough.  Denies dyspnea or wheezing.  Tmax 99.  Felt nauseated today.  3d ago had significant diarrhea.  No fevers/chills, abd pain, ear or tooth pain, headache.  Has been using salt water gargles which have helped.  Also has tried mucinex.    No sick contacts at home. No smokers at home. No h/o asthma.  Past Medical History  Diagnosis Date  . Type 2 diabetes with nephropathy     resolved with weight loss  . Hyperlipidemia   . Hypertension   . Anxiety   . Facial droop 1935    acquired during forceps delivery, some residual R visual loss  . Anemia in chronic kidney disease     IV iron infusion  . Hx of breast cancer 2004  . Arthritis     mild in lower back  . End stage renal disease on dialysis     HD M,W,F [facility]  . GERD (gastroesophageal reflux disease)   . Cardiac murmur     a. thought due to AVF (2D echo 06/2012 without significant valvular disease).   . Bradycardia     a. Noted 06/2012.  Marland Kitchen Hypotension     a. Associated w/ dialysis.  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 [name])    Relevant past medical, surgical, family and social history reviewed and updated as indicated.  Allergies and medications reviewed and updated. Current Outpatient Prescriptions on File Prior to Visit  Medication Sig  . amLODipine (NORVASC) 5 MG tablet Take 5 mg by mouth as directed. Daily except  do not take on dialysis days.  . Carboxymethylcellulose Sodium (REFRESH OP) Place 1 application into the right eye 2 (two) times daily as needed. For pain and dryness in left eye  . omeprazole (PRILOSEC) 20 MG capsule TAKE ONE CAPSULE BY MOUTH EVERY DAY  . simvastatin (ZOCOR) 20 MG tablet TAKE ONE TABLET BY MOUTH AT BEDTIME  . B Complex-C-Folic Acid (RENA-VITE PO) Take 1 tablet by mouth daily with breakfast.   . Calcium Carbonate Antacid (TUMS ULTRA 1000 PO) Take 2,000 mg by mouth 3 (three) times daily with meals.    No current facility-administered medications on file prior to visit.    Review of Systems Per HPI unless specifically indicated above    Objective:    BP 126/72  Pulse 72  Temp(Src) 98.4 F (36.9 C) (Oral)  Wt 112 lb 8 oz (51.03 kg)  SpO2 97%  Physical Exam  Nursing note and vitals reviewed. Constitutional: She appears well-developed and well-nourished. No distress.  HENT:  Head: Normocephalic and atraumatic.  Right Ear: Hearing, tympanic membrane, external ear and ear canal normal.  Left Ear: Hearing, tympanic membrane, external ear and ear canal normal.  Nose: No mucosal edema or rhinorrhea. Right  sinus exhibits maxillary sinus tenderness. Right sinus exhibits no frontal sinus tenderness. Left sinus exhibits maxillary sinus tenderness. Left sinus exhibits no frontal sinus tenderness.  Mouth/Throat: Uvula is midline and mucous membranes are normal. Posterior oropharyngeal edema and posterior oropharyngeal erythema present. No oropharyngeal exudate or tonsillar abscesses.  Hearing aides in place   Eyes: Conjunctivae and EOM are normal. Pupils are equal, round, and reactive to light. No scleral icterus.  Chronic right facial droop since birth  Neck: Normal range of motion. Neck supple.  Cardiovascular: Normal rate, regular rhythm, normal heart sounds and intact distal pulses.   No murmur heard. Pulmonary/Chest: Effort normal and breath sounds normal. No respiratory  distress. She has no wheezes. She has no rales.  Left coarse sounds  Musculoskeletal:  RUE fistula  Lymphadenopathy:    She has no cervical adenopathy.  Skin: Skin is warm and dry. No rash noted.   Results for orders placed in visit on 03/20/13  HEMOGLOBIN      Result Value Ref Range   HGB 11.7    ALBUMIN      Result Value Ref Range   Albumin 4.2    AST      Result Value Ref Range   AST 29     ALT 24  7 - 35 U/L  CREATININE, SERUM      Result Value Ref Range   Creat 6.9        Assessment & Plan:   Problem List Items Addressed This Visit   Acute sinusitis - Primary     Given duration and progression of sxs, and comorbidities, will treat as bacterial sinusitis with 7d course amox. Renally dose amox - 250mg  once daily for 7 days. Update if sxs persist or fail to improve.    Relevant Medications      amoxicillin (AMOXIL) capsule       Follow up plan: Return if symptoms worsen or fail to improve.

## 2013-06-22 NOTE — Assessment & Plan Note (Signed)
Given duration and progression of sxs, and comorbidities, will treat as bacterial sinusitis with 7d course amox. Renally dose amox - 250mg  once daily for 7 days. Update if sxs persist or fail to improve.

## 2013-06-25 DIAGNOSIS — N186 End stage renal disease: Secondary | ICD-10-CM | POA: Diagnosis not present

## 2013-06-25 DIAGNOSIS — D631 Anemia in chronic kidney disease: Secondary | ICD-10-CM | POA: Diagnosis not present

## 2013-06-25 DIAGNOSIS — N2581 Secondary hyperparathyroidism of renal origin: Secondary | ICD-10-CM | POA: Diagnosis not present

## 2013-06-25 DIAGNOSIS — E1129 Type 2 diabetes mellitus with other diabetic kidney complication: Secondary | ICD-10-CM | POA: Diagnosis not present

## 2013-06-25 DIAGNOSIS — D509 Iron deficiency anemia, unspecified: Secondary | ICD-10-CM | POA: Diagnosis not present

## 2013-06-25 DIAGNOSIS — E785 Hyperlipidemia, unspecified: Secondary | ICD-10-CM | POA: Diagnosis not present

## 2013-06-26 DIAGNOSIS — Z1231 Encounter for screening mammogram for malignant neoplasm of breast: Secondary | ICD-10-CM | POA: Diagnosis not present

## 2013-06-26 DIAGNOSIS — N186 End stage renal disease: Secondary | ICD-10-CM | POA: Diagnosis not present

## 2013-06-27 ENCOUNTER — Encounter: Payer: Self-pay | Admitting: Vascular Surgery

## 2013-06-27 ENCOUNTER — Encounter: Payer: Self-pay | Admitting: Family Medicine

## 2013-06-27 DIAGNOSIS — E1129 Type 2 diabetes mellitus with other diabetic kidney complication: Secondary | ICD-10-CM | POA: Diagnosis not present

## 2013-06-27 DIAGNOSIS — D631 Anemia in chronic kidney disease: Secondary | ICD-10-CM | POA: Diagnosis not present

## 2013-06-27 DIAGNOSIS — D509 Iron deficiency anemia, unspecified: Secondary | ICD-10-CM | POA: Diagnosis not present

## 2013-06-27 DIAGNOSIS — N186 End stage renal disease: Secondary | ICD-10-CM | POA: Diagnosis not present

## 2013-06-27 DIAGNOSIS — N039 Chronic nephritic syndrome with unspecified morphologic changes: Secondary | ICD-10-CM | POA: Diagnosis not present

## 2013-06-28 ENCOUNTER — Ambulatory Visit (HOSPITAL_COMMUNITY)
Admission: RE | Admit: 2013-06-28 | Discharge: 2013-06-28 | Disposition: A | Discharge status: Home / Self Care | Payer: Medicare Other | Source: Ambulatory Visit | Attending: Vascular Surgery | Admitting: Vascular Surgery

## 2013-06-28 ENCOUNTER — Encounter: Payer: Self-pay | Admitting: Vascular Surgery

## 2013-06-28 ENCOUNTER — Ambulatory Visit (INDEPENDENT_AMBULATORY_CARE_PROVIDER_SITE_OTHER): Payer: Medicare Other | Admitting: Vascular Surgery

## 2013-06-28 ENCOUNTER — Ambulatory Visit (INDEPENDENT_AMBULATORY_CARE_PROVIDER_SITE_OTHER)
Admission: RE | Admit: 2013-06-28 | Discharge: 2013-06-28 | Disposition: A | Discharge status: Home / Self Care | Payer: Medicare Other | Source: Ambulatory Visit | Attending: Vascular Surgery | Admitting: Vascular Surgery

## 2013-06-28 ENCOUNTER — Encounter: Payer: Self-pay | Admitting: Family Medicine

## 2013-06-28 ENCOUNTER — Encounter: Payer: Self-pay | Admitting: *Deleted

## 2013-06-28 ENCOUNTER — Other Ambulatory Visit: Payer: Self-pay | Admitting: *Deleted

## 2013-06-28 VITALS — BP 165/76 | HR 81 | Ht 59.0 in | Wt 115.0 lb

## 2013-06-28 DIAGNOSIS — Z01810 Encounter for preprocedural cardiovascular examination: Secondary | ICD-10-CM

## 2013-06-28 DIAGNOSIS — N186 End stage renal disease: Secondary | ICD-10-CM

## 2013-06-28 NOTE — Progress Notes (Signed)
Patient is a 80 year old female who is sent by Dr. Jimmy Footman for evaluation of a poorly functioning right arm AV fistula. She currently dialyzes Monday Wednesday and Friday at [facility]. She has intermittently had bleeding episodes from the fistula requiring a trip to the emergency room. She was seen by Dr. Kellie Simmering previously approximately one month ago and told that if the fistula was not working properly we should consider a new access.   Current Outpatient Prescriptions on File Prior to Visit  Medication Sig Dispense Refill  . amLODipine (NORVASC) 5 MG tablet Take 5 mg by mouth as directed. Daily except do not take on dialysis days.      Marland Kitchen amoxicillin (AMOXIL) 250 MG capsule Take 1 capsule (250 mg total) by mouth daily.  7 capsule  0  . B Complex-C-Folic Acid (RENA-VITE PO) Take 1 tablet by mouth daily with breakfast.       . Calcium Carbonate Antacid (TUMS ULTRA 1000 PO) Take 2,000 mg by mouth 3 (three) times daily with meals.       . Carboxymethylcellulose Sodium (REFRESH OP) Place 1 application into the right eye 2 (two) times daily as needed. For pain and dryness in left eye      . omeprazole (PRILOSEC) 20 MG capsule TAKE ONE CAPSULE BY MOUTH EVERY DAY  30 capsule  2  . simvastatin (ZOCOR) 20 MG tablet TAKE ONE TABLET BY MOUTH AT BEDTIME  30 tablet  11   No current facility-administered medications on file prior to visit.   Past Medical History  Diagnosis Date  . Type 2 diabetes with nephropathy     resolved with weight loss  . Hyperlipidemia   . Hypertension   . Anxiety   . Facial droop 1935    acquired during forceps delivery, some residual R visual loss  . Anemia in chronic kidney disease     IV iron infusion  . Hx of breast cancer 2004  . Arthritis     mild in lower back  . End stage renal disease on dialysis     HD M,W,F [facility]  . GERD (gastroesophageal reflux disease)   . Cardiac murmur     a. thought due to AVF (2D echo 06/2012 without  significant valvular disease).   . Bradycardia     a. Noted 06/2012.  Marland Kitchen Hypotension     a. Associated w/ dialysis.  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 [name])    Past Surgical History  Procedure Laterality Date  . Cataract extraction  1981  . Eye surgery  1966  . Av fistula placement  07/20/10    Right brachiocephalic AVF  . Breast lumpectomy    . Breast biopsy    . Umbilical hernia repair    . [facility]  2013    [facility]  . US echocardiography  06/2012    LVH, EF 65%, grade 1 diastol dysfunction, mod dliated LAD    Physical exam:  Filed Vitals:   06/28/13 1153  BP: 165/76  Pulse: 81  Height: 4\' 11"  (1.499 m)  Weight: 115 lb (52.164 kg)  SpO2: 99%    Right upper extremity: Easily palpable and visible fistula. The fistula slightly pulsatile and character.  Data: Previous fistula duplex from 10/28/2011 is reviewed which showed tortuosity of the fistula diameter greater than 8 mm.  Recent duplex in February of 2015 showed similar findings  Assessment: No obvious reason for difficult cannulation. However the story  is consistent with possible venous hypertension. The fistula has a pulsatile character which also could suggest a stenosis. Before we abandoned this right arm fistula which is of nice diameter I believe the patient should undergo fistulogram and possible angioplasty if necessary.  Plan: Right arm AV fistula possible intervention scheduled for 07/05/2013. Risks benefits possible palpitations and procedure details were discussed with the patient today. She understands and agrees to proceed.

## 2013-06-29 ENCOUNTER — Encounter (HOSPITAL_COMMUNITY): Payer: Self-pay | Admitting: Pharmacy Technician

## 2013-07-04 MED ORDER — SODIUM CHLORIDE 0.9 % IV SOLN: 250.0000 mL | INTRAVENOUS | Status: DC | PRN

## 2013-07-04 MED ORDER — SODIUM CHLORIDE 0.9 % IJ SOLN: 3.0000 mL | INTRAMUSCULAR | Status: DC | PRN

## 2013-07-04 MED ORDER — SODIUM CHLORIDE 0.9 % IJ SOLN: 3.0000 mL | Freq: Two times a day (BID) | INTRAMUSCULAR | Status: DC

## 2013-07-05 ENCOUNTER — Encounter (HOSPITAL_COMMUNITY)
Admission: RE | Disposition: A | Discharge status: Home / Self Care | Payer: Self-pay | Source: Ambulatory Visit | Attending: Vascular Surgery

## 2013-07-05 ENCOUNTER — Ambulatory Visit (HOSPITAL_COMMUNITY)
Admission: RE | Admit: 2013-07-05 | Discharge: 2013-07-05 | Disposition: A | Discharge status: Home / Self Care | Payer: Medicare Other | Source: Ambulatory Visit | Attending: Vascular Surgery | Admitting: Vascular Surgery

## 2013-07-05 DIAGNOSIS — N186 End stage renal disease: Secondary | ICD-10-CM | POA: Diagnosis not present

## 2013-07-05 DIAGNOSIS — I498 Other specified cardiac arrhythmias: Secondary | ICD-10-CM | POA: Diagnosis not present

## 2013-07-05 DIAGNOSIS — R011 Cardiac murmur, unspecified: Secondary | ICD-10-CM | POA: Diagnosis not present

## 2013-07-05 DIAGNOSIS — T82898A Other specified complication of vascular prosthetic devices, implants and grafts, initial encounter: Secondary | ICD-10-CM | POA: Diagnosis not present

## 2013-07-05 DIAGNOSIS — E1129 Type 2 diabetes mellitus with other diabetic kidney complication: Secondary | ICD-10-CM | POA: Diagnosis not present

## 2013-07-05 DIAGNOSIS — Z853 Personal history of malignant neoplasm of breast: Secondary | ICD-10-CM | POA: Diagnosis not present

## 2013-07-05 DIAGNOSIS — Y832 Surgical operation with anastomosis, bypass or graft as the cause of abnormal reaction of the patient, or of later complication, without mention of misadventure at the time of the procedure: Secondary | ICD-10-CM | POA: Insufficient documentation

## 2013-07-05 DIAGNOSIS — K219 Gastro-esophageal reflux disease without esophagitis: Secondary | ICD-10-CM | POA: Insufficient documentation

## 2013-07-05 DIAGNOSIS — Z992 Dependence on renal dialysis: Secondary | ICD-10-CM | POA: Diagnosis not present

## 2013-07-05 DIAGNOSIS — I871 Compression of vein: Secondary | ICD-10-CM | POA: Diagnosis not present

## 2013-07-05 DIAGNOSIS — I120 Hypertensive chronic kidney disease with stage 5 chronic kidney disease or end stage renal disease: Secondary | ICD-10-CM | POA: Insufficient documentation

## 2013-07-05 DIAGNOSIS — M47817 Spondylosis without myelopathy or radiculopathy, lumbosacral region: Secondary | ICD-10-CM | POA: Diagnosis not present

## 2013-07-05 DIAGNOSIS — E785 Hyperlipidemia, unspecified: Secondary | ICD-10-CM | POA: Diagnosis not present

## 2013-07-05 DIAGNOSIS — D631 Anemia in chronic kidney disease: Secondary | ICD-10-CM | POA: Insufficient documentation

## 2013-07-05 DIAGNOSIS — N039 Chronic nephritic syndrome with unspecified morphologic changes: Secondary | ICD-10-CM

## 2013-07-05 DIAGNOSIS — F411 Generalized anxiety disorder: Secondary | ICD-10-CM | POA: Diagnosis not present

## 2013-07-05 HISTORY — PX: SHUNTOGRAM: SHX5510

## 2013-07-05 LAB — POCT I-STAT, CHEM 8
BUN: 47 mg/dL — AB (ref 6–23)
CHLORIDE: 96 meq/L (ref 96–112)
Calcium, Ion: 1.09 mmol/L — ABNORMAL LOW (ref 1.13–1.30)
Creatinine, Ser: 5.6 mg/dL — ABNORMAL HIGH (ref 0.50–1.10)
Glucose, Bld: 99 mg/dL (ref 70–99)
HCT: 41 % (ref 36.0–46.0)
Hemoglobin: 13.9 g/dL (ref 12.0–15.0)
Potassium: 3.7 mEq/L (ref 3.7–5.3)
SODIUM: 142 meq/L (ref 137–147)
TCO2: 31 mmol/L (ref 0–100)

## 2013-07-05 SURGERY — SHUNTOGRAM
Anesthesia: LOCAL | Laterality: Right

## 2013-07-05 MED ORDER — HYDRALAZINE HCL 20 MG/ML IJ SOLN: 10.0000 mg | INTRAMUSCULAR | Status: DC | PRN

## 2013-07-05 MED ORDER — LIDOCAINE HCL (PF) 1 % IJ SOLN
INTRAMUSCULAR | Status: AC
  Filled 2013-07-05: qty 30

## 2013-07-05 MED ORDER — LABETALOL HCL 5 MG/ML IV SOLN: 10.0000 mg | INTRAVENOUS | Status: DC | PRN

## 2013-07-05 MED ORDER — ACETAMINOPHEN 325 MG PO TABS: 325.0000 mg | ORAL_TABLET | ORAL | Status: DC | PRN

## 2013-07-05 MED ORDER — ONDANSETRON HCL 4 MG/2ML IJ SOLN: 4.0000 mg | Freq: Four times a day (QID) | INTRAMUSCULAR | Status: DC | PRN

## 2013-07-05 MED ORDER — METOPROLOL TARTRATE 1 MG/ML IV SOLN: 2.0000 mg | INTRAVENOUS | Status: DC | PRN

## 2013-07-05 MED ORDER — ACETAMINOPHEN 325 MG RE SUPP: 325.0000 mg | RECTAL | Status: DC | PRN

## 2013-07-05 MED ORDER — HEPARIN (PORCINE) IN NACL 2-0.9 UNIT/ML-% IJ SOLN
INTRAMUSCULAR | Status: AC
  Filled 2013-07-05: qty 500

## 2013-07-05 NOTE — Progress Notes (Signed)
Pt was preparing to go home. Dressed and getting up to get in wheelchair.  Pt called out that her dressing was bloody at her fistula site.  Dressing taken off by Conchita Paris. And blood noted to be rhythmically squirting.  Pressure held immediately by Walker,R.N. And cath lab notified.  Aaron Edelman over to hold pressure and assess site.

## 2013-07-05 NOTE — Progress Notes (Signed)
Pressure held for 20 minutes by cath lab staff. Tegaderm dsg applied. Site unremarkable. Dr. Oneida Alar paged.

## 2013-07-05 NOTE — Discharge Instructions (Signed)
AV Fistula °Care After °Refer to this sheet in the next few weeks. These instructions provide you with information on caring for yourself after your procedure. Your caregiver may also give you more specific instructions. Your treatment has been planned according to current medical practices, but problems sometimes occur. Call your caregiver if you have any problems or questions after your procedure. °HOME CARE INSTRUCTIONS  °· Do not drive a car or take public transportation alone. °· Do not drink alcohol. °· Only take medicine that has been prescribed by your caregiver. °· Do not sign important papers or make important decisions. °· Have a responsible person with you. °· Ask your caregiver to show you how to check your access at home for a vibration (called a "thrill") or for a sound (called a "bruit" pronounced brew-ee). °· Your vein will need time to enlarge and mature so needles can be inserted for dialysis. Follow your caregiver's instructions about what you need to do to make this happen. °· Keep dressings clean and dry. °· Keep the arm elevated above your heart. Use a pillow. °· Rest. °· Use the arm as usual for all activities. °· Have the stitches or tape closures removed in 10 to 14 days, or as directed by your caregiver. °· Do not sleep or lie on the area of the fistula or that arm. This may decrease or stop the blood flow through your fistula. °· Do not allow blood pressures to be taken on this arm. °· Do not allow blood drawing to be done from the graft. °· Do not wear tight clothing around the access site or on the arm. °· Avoid lifting heavy objects with the arm that has the fistula. °· Do not use creams or lotions over the access site. °SEEK MEDICAL CARE IF:  °· You have a fever. °· You have swelling around the fistula that gets worse, or you have new pain. °· You have unusual bleeding at the fistula site or from any other area. °· You have pus or other drainage at the fistula site. °· You have skin  redness or red streaking on the skin around, above, or below the fistula site. °· Your access site feels warm. °· You have any flu-like symptoms. °SEEK IMMEDIATE MEDICAL CARE IF:  °· You have pain, numbness, or an unusual pale skin on the hand or on the side of your fistula. °· You have dizziness or weakness that you have not had before. °· You have shortness of breath. °· You have chest pain. °· Your fistula disconnects or breaks, and there is bleeding that cannot be easily controlled. °Call for local emergency medical help. Do not try to drive yourself to the hospital. °MAKE SURE YOU °· Understand these instructions. °· Will watch your condition. °· Will get help right away if you are not doing well or get worse. °Document Released: 03/15/2005 Document Revised: 06/07/2011 Document Reviewed: 09/02/2010 °ExitCare® Patient Information ©2014 [employer]. ° °

## 2013-07-05 NOTE — Progress Notes (Signed)
Right upper arm dressing CDI. See assessment. Per Dr. Oneida Alar may discharge patient.

## 2013-07-05 NOTE — Interval H&P Note (Signed)
History and Physical Interval Note:  07/05/2013 1:13 PM  [name]  has presented today for surgery, with the diagnosis of End stage Renal  The various methods of treatment have been discussed with the patient and family. After consideration of risks, benefits and other options for treatment, the patient has consented to  Procedure(s): FISTULOGRAM (Right) as a surgical intervention .  The patient's history has been reviewed, patient examined, no change in status, stable for surgery.  I have reviewed the patient's chart and labs.  Questions were answered to the patient's satisfaction.     [name]

## 2013-07-05 NOTE — Op Note (Signed)
Procedure: Right brachial cephalic fistulogram  Preoperative diagnosis: Poor flow AV fistula  Postoperative diagnosis: Same  Anesthesia: Local  Operative details: After obtaining informed consent, the patient was taken to the [facility] lab. The patient was placed in supine position on the Angio table. Entire left upper extremity was prepped and draped in usual sterile fashion.   Local anesthesia was infiltrated over the proximal portion of the fistula in the right arm.   A micropuncture needle was brought up on the operative field and this was used to directly cannulate the fistula using ultrasound guidance. A micropuncture wire was then threaded into the fistula and the micropuncture sheath threaded over this. Sheath was thoroughly flushed with heparinized saline and the dilator was removed. Contrast angiogram was then performed of the fistula.   The central venous structures are patent. There is a stenosis 70% at the subclavian innominate junction but this has brisk flow.  There is some collateral filling to the internal jugular vein and back into the axillary vein.  There is an additional 70% stenosis where the cephalic inserts into the subclavian vein.  This is a small vein.  The fistula is patent in its midportion.  Next, pressure was held on the upper arm in order to reflux contrast across the arterial anastomosis. There is a 50% narrowing across the arterial anastomosis   A 3-0 Monocryl pursestring stitch was placed around the sheath and the sheath removed. Hemostasis was obtained.  The patient tolerated the procedure well and there were no complications. The patient was taken to the holding area in stable condition.  Operative findings: Tandem 70% stenosis insertion of cephalic to subclavian vein and right innominate vein  Will discuss with Dr Posey Pronto.   If main problem is cannulation this fistula should be easily stickable.  If the fistula had primarily a flow issue most likely needs new left arm  access because I do not believe PTA of the cephalic subclavian junction will be durable due to small vessel size at the area of narrowing and with the additional central vein narrowing.          Ruta Hinds, MD [facility] Office: [phone] Pager: [phone]

## 2013-07-05 NOTE — H&P (View-Only) (Signed)
Patient is a 80 year old female who is sent by Dr. Jimmy Footman for evaluation of a poorly functioning right arm AV fistula. She currently dialyzes Monday Wednesday and Friday at [facility]. She has intermittently had bleeding episodes from the fistula requiring a trip to the emergency room. She was seen by Dr. Kellie Simmering previously approximately one month ago and told that if the fistula was not working properly we should consider a new access.   Current Outpatient Prescriptions on File Prior to Visit  Medication Sig Dispense Refill  . amLODipine (NORVASC) 5 MG tablet Take 5 mg by mouth as directed. Daily except do not take on dialysis days.      Marland Kitchen amoxicillin (AMOXIL) 250 MG capsule Take 1 capsule (250 mg total) by mouth daily.  7 capsule  0  . B Complex-C-Folic Acid (RENA-VITE PO) Take 1 tablet by mouth daily with breakfast.       . Calcium Carbonate Antacid (TUMS ULTRA 1000 PO) Take 2,000 mg by mouth 3 (three) times daily with meals.       . Carboxymethylcellulose Sodium (REFRESH OP) Place 1 application into the right eye 2 (two) times daily as needed. For pain and dryness in left eye      . omeprazole (PRILOSEC) 20 MG capsule TAKE ONE CAPSULE BY MOUTH EVERY DAY  30 capsule  2  . simvastatin (ZOCOR) 20 MG tablet TAKE ONE TABLET BY MOUTH AT BEDTIME  30 tablet  11   No current facility-administered medications on file prior to visit.   Past Medical History  Diagnosis Date  . Type 2 diabetes with nephropathy     resolved with weight loss  . Hyperlipidemia   . Hypertension   . Anxiety   . Facial droop 1935    acquired during forceps delivery, some residual R visual loss  . Anemia in chronic kidney disease     IV iron infusion  . Hx of breast cancer 2004  . Arthritis     mild in lower back  . End stage renal disease on dialysis     HD M,W,F [facility]  . GERD (gastroesophageal reflux disease)   . Cardiac murmur     a. thought due to AVF (2D echo 06/2012 without  significant valvular disease).   . Bradycardia     a. Noted 06/2012.  Marland Kitchen Hypotension     a. Associated w/ dialysis.  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 [name])    Past Surgical History  Procedure Laterality Date  . Cataract extraction  1981  . Eye surgery  1966  . Av fistula placement  07/20/10    Right brachiocephalic AVF  . Breast lumpectomy    . Breast biopsy    . Umbilical hernia repair    . [facility]  2013    [facility]  . US echocardiography  06/2012    LVH, EF 65%, grade 1 diastol dysfunction, mod dliated LAD    Physical exam:  Filed Vitals:   06/28/13 1153  BP: 165/76  Pulse: 81  Height: 4\' 11"  (1.499 m)  Weight: 115 lb (52.164 kg)  SpO2: 99%    Right upper extremity: Easily palpable and visible fistula. The fistula slightly pulsatile and character.  Data: Previous fistula duplex from 10/28/2011 is reviewed which showed tortuosity of the fistula diameter greater than 8 mm.  Recent duplex in February of 2015 showed similar findings  Assessment: No obvious reason for difficult cannulation. However the story  is consistent with possible venous hypertension. The fistula has a pulsatile character which also could suggest a stenosis. Before we abandoned this right arm fistula which is of nice diameter I believe the patient should undergo fistulogram and possible angioplasty if necessary.  Plan: Right arm AV fistula possible intervention scheduled for 07/05/2013. Risks benefits possible palpitations and procedure details were discussed with the patient today. She understands and agrees to proceed.

## 2013-07-12 ENCOUNTER — Other Ambulatory Visit: Payer: Self-pay | Admitting: *Deleted

## 2013-07-16 ENCOUNTER — Encounter (HOSPITAL_COMMUNITY): Payer: Self-pay | Admitting: Pharmacist

## 2013-07-23 ENCOUNTER — Encounter (HOSPITAL_COMMUNITY): Payer: Self-pay | Admitting: *Deleted

## 2013-07-23 MED ORDER — CEFUROXIME SODIUM 1.5 G IJ SOLR
1.5000 g | INTRAMUSCULAR | Status: AC
  Administered 2013-07-24: 1.5 g via INTRAVENOUS
  Filled 2013-07-23: qty 1.5

## 2013-07-24 ENCOUNTER — Encounter (HOSPITAL_COMMUNITY): Payer: Self-pay | Admitting: *Deleted

## 2013-07-24 ENCOUNTER — Ambulatory Visit (HOSPITAL_COMMUNITY)
Admission: RE | Admit: 2013-07-24 | Discharge: 2013-07-24 | Disposition: A | Discharge status: Home / Self Care | Payer: Medicare Other | Source: Ambulatory Visit | Attending: Vascular Surgery | Admitting: Vascular Surgery

## 2013-07-24 ENCOUNTER — Ambulatory Visit (HOSPITAL_COMMUNITY): Payer: Medicare Other | Admitting: Anesthesiology

## 2013-07-24 ENCOUNTER — Other Ambulatory Visit: Payer: Self-pay | Admitting: *Deleted

## 2013-07-24 ENCOUNTER — Ambulatory Visit (HOSPITAL_COMMUNITY): Payer: Medicare Other

## 2013-07-24 ENCOUNTER — Encounter (HOSPITAL_COMMUNITY)
Admission: RE | Disposition: A | Discharge status: Home / Self Care | Payer: Self-pay | Source: Ambulatory Visit | Attending: Vascular Surgery

## 2013-07-24 ENCOUNTER — Encounter (HOSPITAL_COMMUNITY): Payer: Medicare Other | Admitting: Anesthesiology

## 2013-07-24 DIAGNOSIS — E785 Hyperlipidemia, unspecified: Secondary | ICD-10-CM | POA: Diagnosis not present

## 2013-07-24 DIAGNOSIS — Z992 Dependence on renal dialysis: Secondary | ICD-10-CM | POA: Diagnosis not present

## 2013-07-24 DIAGNOSIS — T82598A Other mechanical complication of other cardiac and vascular devices and implants, initial encounter: Secondary | ICD-10-CM | POA: Diagnosis not present

## 2013-07-24 DIAGNOSIS — Z4931 Encounter for adequacy testing for hemodialysis: Secondary | ICD-10-CM

## 2013-07-24 DIAGNOSIS — J986 Disorders of diaphragm: Secondary | ICD-10-CM | POA: Diagnosis not present

## 2013-07-24 DIAGNOSIS — N185 Chronic kidney disease, stage 5: Secondary | ICD-10-CM

## 2013-07-24 DIAGNOSIS — E1129 Type 2 diabetes mellitus with other diabetic kidney complication: Secondary | ICD-10-CM | POA: Diagnosis not present

## 2013-07-24 DIAGNOSIS — I120 Hypertensive chronic kidney disease with stage 5 chronic kidney disease or end stage renal disease: Secondary | ICD-10-CM | POA: Diagnosis not present

## 2013-07-24 DIAGNOSIS — R011 Cardiac murmur, unspecified: Secondary | ICD-10-CM | POA: Insufficient documentation

## 2013-07-24 DIAGNOSIS — F411 Generalized anxiety disorder: Secondary | ICD-10-CM | POA: Diagnosis not present

## 2013-07-24 DIAGNOSIS — M479 Spondylosis, unspecified: Secondary | ICD-10-CM | POA: Diagnosis not present

## 2013-07-24 DIAGNOSIS — N186 End stage renal disease: Secondary | ICD-10-CM | POA: Diagnosis not present

## 2013-07-24 DIAGNOSIS — N039 Chronic nephritic syndrome with unspecified morphologic changes: Secondary | ICD-10-CM

## 2013-07-24 DIAGNOSIS — K219 Gastro-esophageal reflux disease without esophagitis: Secondary | ICD-10-CM | POA: Insufficient documentation

## 2013-07-24 DIAGNOSIS — Z01811 Encounter for preprocedural respiratory examination: Secondary | ICD-10-CM | POA: Diagnosis not present

## 2013-07-24 DIAGNOSIS — I10 Essential (primary) hypertension: Secondary | ICD-10-CM | POA: Diagnosis not present

## 2013-07-24 DIAGNOSIS — I252 Old myocardial infarction: Secondary | ICD-10-CM | POA: Insufficient documentation

## 2013-07-24 DIAGNOSIS — D631 Anemia in chronic kidney disease: Secondary | ICD-10-CM | POA: Diagnosis not present

## 2013-07-24 DIAGNOSIS — I509 Heart failure, unspecified: Secondary | ICD-10-CM | POA: Insufficient documentation

## 2013-07-24 DIAGNOSIS — Z87891 Personal history of nicotine dependence: Secondary | ICD-10-CM | POA: Insufficient documentation

## 2013-07-24 HISTORY — PX: BASCILIC VEIN TRANSPOSITION: SHX5742

## 2013-07-24 LAB — POCT I-STAT 4, (NA,K, GLUC, HGB,HCT)
GLUCOSE: 111 mg/dL — AB (ref 70–99)
HCT: 39 % (ref 36.0–46.0)
Hemoglobin: 13.3 g/dL (ref 12.0–15.0)
POTASSIUM: 3.5 meq/L — AB (ref 3.7–5.3)
Sodium: 140 mEq/L (ref 137–147)

## 2013-07-24 LAB — GLUCOSE, CAPILLARY: GLUCOSE-CAPILLARY: 86 mg/dL (ref 70–99)

## 2013-07-24 SURGERY — TRANSPOSITION, VEIN, BASILIC
Anesthesia: Monitor Anesthesia Care | Site: Arm Upper | Laterality: Left

## 2013-07-24 MED ORDER — PROTAMINE SULFATE 10 MG/ML IV SOLN
INTRAVENOUS | Status: DC | PRN
  Administered 2013-07-24: 50 mg via INTRAVENOUS

## 2013-07-24 MED ORDER — PHENYLEPHRINE 40 MCG/ML (10ML) SYRINGE FOR IV PUSH (FOR BLOOD PRESSURE SUPPORT)
PREFILLED_SYRINGE | INTRAVENOUS | Status: AC
  Filled 2013-07-24: qty 10

## 2013-07-24 MED ORDER — SODIUM CHLORIDE 0.9 % IV SOLN: INTRAVENOUS | Status: DC

## 2013-07-24 MED ORDER — 0.9 % SODIUM CHLORIDE (POUR BTL) OPTIME
TOPICAL | Status: DC | PRN
  Administered 2013-07-24: 1000 mL

## 2013-07-24 MED ORDER — EPHEDRINE SULFATE 50 MG/ML IJ SOLN
INTRAMUSCULAR | Status: DC | PRN
  Administered 2013-07-24: 10 mg via INTRAVENOUS

## 2013-07-24 MED ORDER — PROPOFOL 10 MG/ML IV BOLUS
INTRAVENOUS | Status: AC
  Filled 2013-07-24: qty 20

## 2013-07-24 MED ORDER — ONDANSETRON HCL 4 MG/2ML IJ SOLN
INTRAMUSCULAR | Status: DC | PRN
  Administered 2013-07-24: 4 mg via INTRAVENOUS

## 2013-07-24 MED ORDER — OXYCODONE HCL 5 MG PO TABS
5.0000 mg | ORAL_TABLET | Freq: Once | ORAL | Status: AC | PRN
  Administered 2013-07-24: 5 mg via ORAL

## 2013-07-24 MED ORDER — PROPOFOL INFUSION 10 MG/ML OPTIME
INTRAVENOUS | Status: DC | PRN
  Administered 2013-07-24: 50 ug/kg/min via INTRAVENOUS

## 2013-07-24 MED ORDER — FENTANYL CITRATE 0.05 MG/ML IJ SOLN
INTRAMUSCULAR | Status: DC | PRN
  Administered 2013-07-24: 25 ug via INTRAVENOUS
  Administered 2013-07-24: 50 ug via INTRAVENOUS
  Administered 2013-07-24 (×3): 25 ug via INTRAVENOUS

## 2013-07-24 MED ORDER — SODIUM CHLORIDE 0.9 % IR SOLN
Status: DC | PRN
  Administered 2013-07-24: 08:00:00

## 2013-07-24 MED ORDER — OXYCODONE HCL 5 MG/5ML PO SOLN: 5.0000 mg | Freq: Once | ORAL | Status: AC | PRN

## 2013-07-24 MED ORDER — LIDOCAINE HCL (PF) 1 % IJ SOLN
INTRAMUSCULAR | Status: AC
  Filled 2013-07-24: qty 30

## 2013-07-24 MED ORDER — THROMBIN 20000 UNITS EX SOLR
CUTANEOUS | Status: AC
  Filled 2013-07-24: qty 20000

## 2013-07-24 MED ORDER — PROPOFOL 10 MG/ML IV BOLUS
INTRAVENOUS | Status: DC | PRN
  Administered 2013-07-24: 10 mg via INTRAVENOUS

## 2013-07-24 MED ORDER — ONDANSETRON HCL 4 MG/2ML IJ SOLN
INTRAMUSCULAR | Status: AC
  Filled 2013-07-24: qty 2

## 2013-07-24 MED ORDER — FENTANYL CITRATE 0.05 MG/ML IJ SOLN: 25.0000 ug | INTRAMUSCULAR | Status: DC | PRN

## 2013-07-24 MED ORDER — LIDOCAINE HCL (CARDIAC) 20 MG/ML IV SOLN
INTRAVENOUS | Status: AC
  Filled 2013-07-24: qty 5

## 2013-07-24 MED ORDER — HEPARIN SODIUM (PORCINE) 1000 UNIT/ML IJ SOLN
INTRAMUSCULAR | Status: DC | PRN
  Administered 2013-07-24: 5000 [IU] via INTRAVENOUS

## 2013-07-24 MED ORDER — LIDOCAINE HCL (PF) 1 % IJ SOLN
INTRAMUSCULAR | Status: DC | PRN
  Administered 2013-07-24: 5 mL
  Administered 2013-07-24: 30 mL

## 2013-07-24 MED ORDER — FENTANYL CITRATE 0.05 MG/ML IJ SOLN
INTRAMUSCULAR | Status: AC
  Filled 2013-07-24: qty 5

## 2013-07-24 MED ORDER — CHLORHEXIDINE GLUCONATE CLOTH 2 % EX PADS: 6.0000 | MEDICATED_PAD | Freq: Once | CUTANEOUS | Status: DC

## 2013-07-24 MED ORDER — OXYCODONE-ACETAMINOPHEN 5-325 MG PO TABS: 2.0000 | ORAL_TABLET | Freq: Four times a day (QID) | ORAL | Status: DC | PRN

## 2013-07-24 MED ORDER — OXYCODONE HCL 5 MG PO TABS
ORAL_TABLET | ORAL | Status: AC
  Filled 2013-07-24: qty 1

## 2013-07-24 MED ORDER — PHENYLEPHRINE HCL 10 MG/ML IJ SOLN
INTRAMUSCULAR | Status: DC | PRN
  Administered 2013-07-24: 80 ug via INTRAVENOUS
  Administered 2013-07-24: 40 ug via INTRAVENOUS
  Administered 2013-07-24: 80 ug via INTRAVENOUS
  Administered 2013-07-24: 40 ug via INTRAVENOUS
  Administered 2013-07-24 (×2): 80 ug via INTRAVENOUS

## 2013-07-24 MED ORDER — SODIUM CHLORIDE 0.9 % IV SOLN
INTRAVENOUS | Status: DC | PRN
  Administered 2013-07-24 (×2): via INTRAVENOUS

## 2013-07-24 MED ORDER — ONDANSETRON HCL 4 MG/2ML IJ SOLN: 4.0000 mg | Freq: Once | INTRAMUSCULAR | Status: DC | PRN

## 2013-07-24 SURGICAL SUPPLY — 39 items
ADH SKN CLS APL DERMABOND .7 (GAUZE/BANDAGES/DRESSINGS) ×1
BLADE 10 SAFETY STRL DISP (BLADE) ×2 IMPLANT
CANISTER SUCTION 2500CC (MISCELLANEOUS) ×2 IMPLANT
CLIP TI MEDIUM 24 (CLIP) ×2 IMPLANT
CLIP TI WIDE RED SMALL 24 (CLIP) ×2 IMPLANT
COVER PROBE W GEL 5X96 (DRAPES) ×2 IMPLANT
COVER SURGICAL LIGHT HANDLE (MISCELLANEOUS) ×2 IMPLANT
DECANTER SPIKE VIAL GLASS SM (MISCELLANEOUS) ×2 IMPLANT
DERMABOND ADVANCED (GAUZE/BANDAGES/DRESSINGS) ×1
DERMABOND ADVANCED .7 DNX12 (GAUZE/BANDAGES/DRESSINGS) ×1 IMPLANT
ELECT REM PT RETURN 9FT ADLT (ELECTROSURGICAL) ×2
ELECTRODE REM PT RTRN 9FT ADLT (ELECTROSURGICAL) ×1 IMPLANT
GEL ULTRASOUND 20GR AQUASONIC (MISCELLANEOUS) IMPLANT
GLOVE BIO SURGEON STRL SZ7.5 (GLOVE) ×2 IMPLANT
GLOVE BIOGEL PI IND STRL 6.5 (GLOVE) IMPLANT
GLOVE BIOGEL PI INDICATOR 6.5 (GLOVE) ×1
GLOVE SURG SS PI 7.0 STRL IVOR (GLOVE) ×1 IMPLANT
GOWN STRL REUS W/ TWL LRG LVL3 (GOWN DISPOSABLE) ×3 IMPLANT
GOWN STRL REUS W/ TWL XL LVL3 (GOWN DISPOSABLE) IMPLANT
GOWN STRL REUS W/TWL LRG LVL3 (GOWN DISPOSABLE) ×6
GOWN STRL REUS W/TWL XL LVL3 (GOWN DISPOSABLE) ×2
KIT BASIN OR (CUSTOM PROCEDURE TRAY) ×2 IMPLANT
KIT ROOM TURNOVER OR (KITS) ×2 IMPLANT
LOOP VESSEL MINI RED (MISCELLANEOUS) IMPLANT
NS IRRIG 1000ML POUR BTL (IV SOLUTION) ×2 IMPLANT
PACK CV ACCESS (CUSTOM PROCEDURE TRAY) ×2 IMPLANT
PAD ARMBOARD 7.5X6 YLW CONV (MISCELLANEOUS) ×4 IMPLANT
SPONGE SURGIFOAM ABS GEL 100 (HEMOSTASIS) IMPLANT
SUT PROLENE 7 0 BV 1 (SUTURE) ×3 IMPLANT
SUT SILK 2 0 SH (SUTURE) ×2 IMPLANT
SUT SILK 3 0 (SUTURE) ×4
SUT SILK 3-0 18XBRD TIE 12 (SUTURE) IMPLANT
SUT VIC AB 3-0 SH 27 (SUTURE) ×4
SUT VIC AB 3-0 SH 27X BRD (SUTURE) ×1 IMPLANT
SUT VICRYL 4-0 PS2 18IN ABS (SUTURE) ×3 IMPLANT
TOWEL OR 17X24 6PK STRL BLUE (TOWEL DISPOSABLE) ×2 IMPLANT
TOWEL OR 17X26 10 PK STRL BLUE (TOWEL DISPOSABLE) ×2 IMPLANT
UNDERPAD 30X30 INCONTINENT (UNDERPADS AND DIAPERS) ×2 IMPLANT
WATER STERILE IRR 1000ML POUR (IV SOLUTION) ×2 IMPLANT

## 2013-07-24 NOTE — H&P (View-Only) (Signed)
Patient is a 80-year-old female who is sent by Dr. Deterding for evaluation of a poorly functioning right arm AV fistula. She currently dialyzes Monday Wednesday and Friday at [facility]. She has intermittently had bleeding episodes from the fistula requiring a trip to the emergency room. She was seen by Dr. Lawson previously approximately one month ago and told that if the fistula was not working properly we should consider a new access.   Current Outpatient Prescriptions on File Prior to Visit  Medication Sig Dispense Refill  . amLODipine (NORVASC) 5 MG tablet Take 5 mg by mouth as directed. Daily except do not take on dialysis days.      . amoxicillin (AMOXIL) 250 MG capsule Take 1 capsule (250 mg total) by mouth daily.  7 capsule  0  . B Complex-C-Folic Acid (RENA-VITE PO) Take 1 tablet by mouth daily with breakfast.       . Calcium Carbonate Antacid (TUMS ULTRA 1000 PO) Take 2,000 mg by mouth 3 (three) times daily with meals.       . Carboxymethylcellulose Sodium (REFRESH OP) Place 1 application into the right eye 2 (two) times daily as needed. For pain and dryness in left eye      . omeprazole (PRILOSEC) 20 MG capsule TAKE ONE CAPSULE BY MOUTH EVERY DAY  30 capsule  2  . simvastatin (ZOCOR) 20 MG tablet TAKE ONE TABLET BY MOUTH AT BEDTIME  30 tablet  11   No current facility-administered medications on file prior to visit.   Past Medical History  Diagnosis Date  . Type 2 diabetes with nephropathy     resolved with weight loss  . Hyperlipidemia   . Hypertension   . Anxiety   . Facial droop 1935    acquired during forceps delivery, some residual R visual loss  . Anemia in chronic kidney disease     IV iron infusion  . Hx of breast cancer 2004  . Arthritis     mild in lower back  . End stage renal disease on dialysis     HD M,W,F [facility]  . GERD (gastroesophageal reflux disease)   . Cardiac murmur     a. thought due to AVF (2D echo 06/2012 without  significant valvular disease).   . Bradycardia     a. Noted 06/2012.  . Hypotension     a. Associated w/ dialysis.  . 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 Fowler)    Past Surgical History  Procedure Laterality Date  . Cataract extraction  1981  . Eye surgery  1966  . Av fistula placement  07/20/10    Right brachiocephalic AVF  . Breast lumpectomy    . Breast biopsy    . Umbilical hernia repair    . [facility]a  2013    [facility]  . Us echocardiography  06/2012    LVH, EF 65%, grade 1 diastol dysfunction, mod dliated LAD    Physical exam:  Filed Vitals:   06/28/13 1153  BP: 165/76  Pulse: 81  Height: 4' 11" (1.499 m)  Weight: 115 lb (52.164 kg)  SpO2: 99%    Right upper extremity: Easily palpable and visible fistula. The fistula slightly pulsatile and character.  Data: Previous fistula duplex from 10/28/2011 is reviewed which showed tortuosity of the fistula diameter greater than 8 mm.  Recent duplex in February of 2015 showed similar findings  Assessment: No obvious reason for difficult cannulation. However the story   is consistent with possible venous hypertension. The fistula has a pulsatile character which also could suggest a stenosis. Before we abandoned this right arm fistula which is of nice diameter I believe the patient should undergo fistulogram and possible angioplasty if necessary.  Plan: Right arm AV fistula possible intervention scheduled for 07/05/2013. Risks benefits possible palpitations and procedure details were discussed with the patient today. She understands and agrees to proceed.

## 2013-07-24 NOTE — Anesthesia Procedure Notes (Signed)
Procedure Name: MAC Date/Time: 07/24/2013 7:45 AM Performed by: Scheryl Darter Pre-anesthesia Checklist: Patient identified, Emergency Drugs available, Suction available, Timeout performed and Patient being monitored Patient Re-evaluated:Patient Re-evaluated prior to inductionOxygen Delivery Method: Simple face mask Placement Confirmation: CO2 detector

## 2013-07-24 NOTE — Anesthesia Preprocedure Evaluation (Signed)
Anesthesia Evaluation  Patient identified by MRN, date of birth, ID band Patient awake    Reviewed: Allergy & Precautions, H&P , NPO status , Patient's Chart, lab work & pertinent test results, reviewed documented beta blocker date and time   History of Anesthesia Complications Negative for: history of anesthetic complications  Airway Mallampati: II TM Distance: >3 FB Neck ROM: Full    Dental  (+) Missing   Pulmonary neg sleep apnea, neg COPDformer smoker,  breath sounds clear to auscultation        Cardiovascular hypertension, Pt. on medications and Pt. on home beta blockers - angina- Past MI and - CHF - dysrhythmias + Valvular Problems/Murmurs Rhythm:Regular + Systolic murmurs    Neuro/Psych PSYCHIATRIC DISORDERS Anxiety Congenital facial (right) droop,     GI/Hepatic Neg liver ROS,   Endo/Other  diabetes, Type 2  Renal/GU ESRF and DialysisRenal diseaseLast dialysis 4/27, occasional hypotension with dialysis     Musculoskeletal   Abdominal   Peds  Hematology  (+) anemia ,   Anesthesia Other Findings   Reproductive/Obstetrics                           Anesthesia Physical Anesthesia Plan  ASA: III  Anesthesia Plan: MAC   Post-op Pain Management:    Induction: Intravenous  Airway Management Planned: Simple Face Mask and Natural Airway  Additional Equipment: None  Intra-op Plan:   Post-operative Plan:   Informed Consent: I have reviewed the patients History and Physical, chart, labs and discussed the procedure including the risks, benefits and alternatives for the proposed anesthesia with the patient or authorized representative who has indicated his/her understanding and acceptance.   Dental advisory given  Plan Discussed with: CRNA and Surgeon  Anesthesia Plan Comments:         Anesthesia Quick Evaluation

## 2013-07-24 NOTE — Interval H&P Note (Signed)
History and Physical Interval Note:  07/24/2013 7:25 AM  [name]  has presented today for surgery, with the diagnosis of ESRD  The various methods of treatment have been discussed with the patient and family. After consideration of risks, benefits and other options for treatment, the patient has consented to  Procedure(s): BASILIC VEIN TRANSPOSITION VS ARTERIOVENOUS GRAFT (Left) as a surgical intervention .  The patient's history has been reviewed, patient examined, no change in status, stable for surgery.  I have reviewed the patient's chart and labs.  Questions were answered to the patient's satisfaction.     [name]

## 2013-07-24 NOTE — Anesthesia Postprocedure Evaluation (Signed)
  Anesthesia Post-op Note  Patient: [name]  Procedure(s) Performed: Procedure(s): BASILIC VEIN TRANSPOSITION (Left)  Patient Location: PACU  Anesthesia Type:MAC  Level of Consciousness: awake and alert   Airway and Oxygen Therapy: Patient Spontanous Breathing  Post-op Pain: mild  Post-op Assessment: Post-op Vital signs reviewed, Patient's Cardiovascular Status Stable, Respiratory Function Stable, Patent Airway, No signs of Nausea or vomiting and Pain level controlled  Post-op Vital Signs: Reviewed and stable  Last Vitals:  Filed Vitals:   07/24/13 1145  BP:   Pulse:   Temp: 36.1 C  Resp:     Complications: No apparent anesthesia complications

## 2013-07-24 NOTE — Op Note (Signed)
Procedure: Left basilic vein transposition fistula  Preoperative diagnosis: End-stage renal disease  Postoperative diagnosis: Same  Anesthesia: Local with sedation  Operative findings: 3 mm left basilic vein, 4 mm left brachial artery  Operative details: After obtaining informed consent, the patient was taken to the operating room. The patient was placed in supine position operating table. After adequate sedation, the patient's entire left upper extremity was prepped and draped in the usual sterile fashion. Next ultrasound was used to identify the left basilic vein. Local anesthesia was infiltrated over the course of the vein.  A longitudinal incision was made just above the antecubital crease in order to expose the basilic vein. The vein was of reasonable quality approximately 3 mm in diameter throughout its course. Several longitudinal skip incisions were made up the arm from just below the antecubital crease all the way up to the axilla to harvest the basilic vein. Care was taken to try to not injure any sensory nerves and all the motor nerves were identified and protected. The vein was dissected free circumferentially and small side branches ligated and divided between silk ties or clips. Next the brachial artery was exposed by deepening the basilic vein harvest incision just above the antecubital crease. The artery was approximately 4 mm in diameter. This was dissected free circumferentially. Vessel loops were placed around it.  Next the distal basilic vein was ligated distally with clips and the vein transected. The vein was brought out throughout the skip incisions and gently distended with heparinized saline and marked for orientation. The vein was then tunneled subcutaneously in an arcing configuration out over the biceps muscle down to the level of the exposed brachial artery just above the antecubital crease. The patient was given 5000 units of intravenous heparin. Vessel loops were used to  control the artery proximally and distally. The vein was cut to length and sewn end of vein to side of artery using a running 7-0 Prolene suture. Just prior completion of the anastomosis, it was forebled backbled and thoroughly flushed. The anastomosis was secured Vesseloops were released.   There was a palpable thrill immediately. The distal vein was also noted to fill fully. At this point all subcutaneous tissues were reapproximated using running 3-0 Vicryl suture. All skin incisions were closed with running 4  0 Vicryl subcuticular stitch. Dermabond was applied to all incisions. The patient tolerated the procedure well and there were no complications. Instrument sponge needle counts were correct at the end of the case. The patient was taken to the recovery room in stable condition.  The patient was given 50 of protamine to reverse the heparin.  She had a palpable radial pulse at the end of the case.  Ruta Hinds, MD [facility] Office: [phone] Pager: [phone]

## 2013-07-24 NOTE — Transfer of Care (Signed)
Immediate Anesthesia Transfer of Care Note  Patient: [name]  Procedure(s) Performed: Procedure(s): BASILIC VEIN TRANSPOSITION (Left)  Patient Location: PACU  Anesthesia Type:MAC  Level of Consciousness: awake, alert  and oriented  Airway & Oxygen Therapy: Patient Spontanous Breathing and Patient connected to nasal cannula oxygen  Post-op Assessment: Report given to PACU RN, Post -op Vital signs reviewed and stable and Patient moving all extremities  Post vital signs: Reviewed and stable  Complications: No apparent anesthesia complications

## 2013-07-24 NOTE — Progress Notes (Signed)
Care of pt assumed by MA Darlene Brozowski RN from M. Brande RN 

## 2013-07-25 ENCOUNTER — Telehealth: Payer: Self-pay | Admitting: Vascular Surgery

## 2013-07-25 NOTE — Telephone Encounter (Addendum)
Message copied by Gena Fray on Wed Jul 25, 2013 10:23 AM ------      Message from: Mena Goes      Created: Tue Jul 24, 2013 11:14 AM      Regarding: Schedule                   ----- Message -----         From: [name], MD         Sent: 07/24/2013  10:55 AM           To: [facility] Charge Pool            Left basilic vein transposition fistula      Ultrasound      No assist            She needs follow up in 4-6 weeks with ultrasound                     Juanda Crumble ------  07/25/13: spoke with patient, dpm

## 2013-07-26 ENCOUNTER — Encounter (HOSPITAL_COMMUNITY): Payer: Self-pay | Admitting: Vascular Surgery

## 2013-07-26 DIAGNOSIS — N186 End stage renal disease: Secondary | ICD-10-CM | POA: Diagnosis not present

## 2013-07-27 DIAGNOSIS — E1129 Type 2 diabetes mellitus with other diabetic kidney complication: Secondary | ICD-10-CM | POA: Diagnosis not present

## 2013-07-27 DIAGNOSIS — N186 End stage renal disease: Secondary | ICD-10-CM | POA: Diagnosis not present

## 2013-07-27 DIAGNOSIS — D631 Anemia in chronic kidney disease: Secondary | ICD-10-CM | POA: Diagnosis not present

## 2013-07-27 DIAGNOSIS — N039 Chronic nephritic syndrome with unspecified morphologic changes: Secondary | ICD-10-CM | POA: Diagnosis not present

## 2013-07-27 DIAGNOSIS — D509 Iron deficiency anemia, unspecified: Secondary | ICD-10-CM | POA: Diagnosis not present

## 2013-08-26 DIAGNOSIS — N186 End stage renal disease: Secondary | ICD-10-CM | POA: Diagnosis not present

## 2013-08-27 DIAGNOSIS — N186 End stage renal disease: Secondary | ICD-10-CM | POA: Diagnosis not present

## 2013-08-27 DIAGNOSIS — D631 Anemia in chronic kidney disease: Secondary | ICD-10-CM | POA: Diagnosis not present

## 2013-08-27 DIAGNOSIS — E1129 Type 2 diabetes mellitus with other diabetic kidney complication: Secondary | ICD-10-CM | POA: Diagnosis not present

## 2013-08-29 DIAGNOSIS — D631 Anemia in chronic kidney disease: Secondary | ICD-10-CM | POA: Diagnosis not present

## 2013-08-29 DIAGNOSIS — E1129 Type 2 diabetes mellitus with other diabetic kidney complication: Secondary | ICD-10-CM | POA: Diagnosis not present

## 2013-08-29 DIAGNOSIS — N039 Chronic nephritic syndrome with unspecified morphologic changes: Secondary | ICD-10-CM | POA: Diagnosis not present

## 2013-08-29 DIAGNOSIS — N186 End stage renal disease: Secondary | ICD-10-CM | POA: Diagnosis not present

## 2013-08-31 DIAGNOSIS — E1129 Type 2 diabetes mellitus with other diabetic kidney complication: Secondary | ICD-10-CM | POA: Diagnosis not present

## 2013-08-31 DIAGNOSIS — D631 Anemia in chronic kidney disease: Secondary | ICD-10-CM | POA: Diagnosis not present

## 2013-08-31 DIAGNOSIS — N186 End stage renal disease: Secondary | ICD-10-CM | POA: Diagnosis not present

## 2013-09-03 DIAGNOSIS — E1129 Type 2 diabetes mellitus with other diabetic kidney complication: Secondary | ICD-10-CM | POA: Diagnosis not present

## 2013-09-03 DIAGNOSIS — N186 End stage renal disease: Secondary | ICD-10-CM | POA: Diagnosis not present

## 2013-09-03 DIAGNOSIS — D631 Anemia in chronic kidney disease: Secondary | ICD-10-CM | POA: Diagnosis not present

## 2013-09-05 ENCOUNTER — Encounter: Payer: Self-pay | Admitting: Vascular Surgery

## 2013-09-05 DIAGNOSIS — D631 Anemia in chronic kidney disease: Secondary | ICD-10-CM | POA: Diagnosis not present

## 2013-09-05 DIAGNOSIS — N186 End stage renal disease: Secondary | ICD-10-CM | POA: Diagnosis not present

## 2013-09-05 DIAGNOSIS — E1129 Type 2 diabetes mellitus with other diabetic kidney complication: Secondary | ICD-10-CM | POA: Diagnosis not present

## 2013-09-06 ENCOUNTER — Ambulatory Visit (HOSPITAL_COMMUNITY)
Admission: RE | Admit: 2013-09-06 | Discharge: 2013-09-06 | Disposition: A | Discharge status: Home / Self Care | Payer: Medicare Other | Source: Ambulatory Visit | Attending: Vascular Surgery | Admitting: Vascular Surgery

## 2013-09-06 ENCOUNTER — Encounter: Payer: Self-pay | Admitting: Vascular Surgery

## 2013-09-06 ENCOUNTER — Ambulatory Visit (INDEPENDENT_AMBULATORY_CARE_PROVIDER_SITE_OTHER): Payer: Medicare Other | Admitting: Vascular Surgery

## 2013-09-06 VITALS — BP 202/84 | HR 77 | Resp 16 | Ht 59.0 in | Wt 115.0 lb

## 2013-09-06 DIAGNOSIS — Z4931 Encounter for adequacy testing for hemodialysis: Secondary | ICD-10-CM

## 2013-09-06 DIAGNOSIS — N186 End stage renal disease: Secondary | ICD-10-CM

## 2013-09-06 NOTE — Addendum Note (Signed)
Addended by: Mena Goes on: 09/06/2013 04:27 PM   Modules accepted: Orders

## 2013-09-06 NOTE — Progress Notes (Signed)
Patient is a 80 year old female who underwent left basilic vein transposition fistula on April 28. She returns today for followup. She complains of intermittent cramping in both hands. This did not bother her too much overall however. They're currently using a degenerating right upper arm fistula successfully.  Physical exam:  Filed Vitals:   09/06/13 1549  BP: 202/84  Pulse: 77  Resp: 16  Height: 4\' 11"  (1.499 m)  Weight: 115 lb (52.164 kg)    Right upper extremity: Patent right upper arm fistula pulsatile in character hands pink and warm bilaterally no ulceration  Left upper extremity: Palpable thrill in fistula the fistula is palpable throughout the upper arm  Data: Patient is a duplex ultrasound AV fistula today. It was 3-4 mm in diameter there was some suggestion of narrowing up at the shoulder level and at the proximal anastomosis  Assessment: Slowly maturing left upper arm AV fistula. Plan: Repeat duplex exam in 6 weeks. If the fistula has developed more at that time and still has suggestion of narrowing we will consider a fistulogram for further evaluation.  Ruta Hinds, MD [facility] Office: [phone] Pager: [phone]

## 2013-09-07 ENCOUNTER — Ambulatory Visit: Payer: Self-pay | Admitting: Family Medicine

## 2013-09-07 ENCOUNTER — Encounter: Payer: Self-pay | Admitting: Family Medicine

## 2013-09-07 ENCOUNTER — Ambulatory Visit (INDEPENDENT_AMBULATORY_CARE_PROVIDER_SITE_OTHER): Payer: Medicare Other | Admitting: Family Medicine

## 2013-09-07 ENCOUNTER — Telehealth: Payer: Self-pay | Admitting: *Deleted

## 2013-09-07 VITALS — BP 176/84 | HR 80 | Temp 98.2°F | Wt 111.8 lb

## 2013-09-07 DIAGNOSIS — E1129 Type 2 diabetes mellitus with other diabetic kidney complication: Secondary | ICD-10-CM | POA: Diagnosis not present

## 2013-09-07 DIAGNOSIS — N039 Chronic nephritic syndrome with unspecified morphologic changes: Secondary | ICD-10-CM | POA: Diagnosis not present

## 2013-09-07 DIAGNOSIS — M7989 Other specified soft tissue disorders: Secondary | ICD-10-CM | POA: Diagnosis not present

## 2013-09-07 DIAGNOSIS — M79609 Pain in unspecified limb: Secondary | ICD-10-CM | POA: Diagnosis not present

## 2013-09-07 DIAGNOSIS — D631 Anemia in chronic kidney disease: Secondary | ICD-10-CM | POA: Diagnosis not present

## 2013-09-07 DIAGNOSIS — M79661 Pain in right lower leg: Secondary | ICD-10-CM | POA: Insufficient documentation

## 2013-09-07 DIAGNOSIS — N186 End stage renal disease: Secondary | ICD-10-CM | POA: Diagnosis not present

## 2013-09-07 NOTE — Progress Notes (Signed)
BP 176/84  Pulse 80  Temp(Src) 98.2 F (36.8 C) (Oral)  Wt 111 lb 12 oz (50.689 kg)   CC: calf pain  Subjective:    Patient ID: [name], female    DOB: February 16, 1932, 80 y.o.   MRN: [id number]  HPI: [name] is a 80 y.o. female presenting on 09/07/2013 for Leg Pain   Just coming in from dialysis today - had 3 hour session this morning. Recently LUE AVF placed, now maturing.  Sees Dr. Oneida Alar.  She has been having bp checked on R leg due to bilateral arm fistulas.  Over last 1 week noticing increased soreness of R leg, catches at lateral calf with turning of leg, walking worsens leg pain - even a few steps.  Ice/heat has helped.  No knee pain, no popliteal pain, no L leg pain. Denies inciting trauma/injury. No erythema, swelling or warmth of calf/RLE.  Relevant past medical, surgical, family and social history reviewed and updated as indicated.  Allergies and medications reviewed and updated. Current Outpatient Prescriptions on File Prior to Visit  Medication Sig  . amLODipine (NORVASC) 5 MG tablet Take 5 mg by mouth See admin instructions. Daily except do not take on dialysis days (mon,wed and fri)  . Artificial Tear Ointment (REFRESH P.M. OP) Place 1 application into the right eye daily.  . B Complex-C-Folic Acid (RENA-VITE PO) Take 1 tablet by mouth daily with breakfast.   . Calcium Carbonate Antacid (TUMS ULTRA 1000 PO) Take 2,000 mg by mouth 3 (three) times daily with meals.   Marland Kitchen omeprazole (PRILOSEC) 20 MG capsule Take 20 mg by mouth daily.  . simvastatin (ZOCOR) 20 MG tablet Take 20 mg by mouth at bedtime.    No current facility-administered medications on file prior to visit.    Review of Systems Per HPI unless specifically indicated above    Objective:    BP 176/84  Pulse 80  Temp(Src) 98.2 F (36.8 C) (Oral)  Wt 111 lb 12 oz (50.689 kg)  Physical Exam  Nursing note and vitals reviewed. Constitutional: She appears well-developed and well-nourished. No  distress.  Musculoskeletal: She exhibits no edema.  2+ DP bilaterally L calf circ 29cm R calf circ 29cm No palpable cords Tender to palpation at R lateral upper calf muscles, reproducible with dorsi/plantar flexion at ankle  Skin: Skin is warm and dry. No rash noted.   Results for orders placed during the hospital encounter of 07/24/13  GLUCOSE, CAPILLARY      Result Value Ref Range   Glucose-Capillary 86  70 - 99 mg/dL   Comment 1 Documented in Chart     Comment 2 Notify RN    POCT I-STAT 4, (NA,K, GLUC, HGB,HCT)      Result Value Ref Range   Sodium 140  137 - 147 mEq/L   Potassium 3.5 (*) 3.7 - 5.3 mEq/L   Glucose, Bld 111 (*) 70 - 99 mg/dL   HCT 39.0  36.0 - 46.0 %   Hemoglobin 13.3  12.0 - 15.0 g/dL      Assessment & Plan:   Problem List Items Addressed This Visit   Right calf pain - Primary     Not consistent with cellulitis, DVT or PAD/claudication. Given ESRD on HD and other risk factors, will check RLE [place] to r/o DVT. Anticipate R calf strain - treat with heating pad, elevation of leg and compression (placed in ace bandage today). Provided with calf strain rehab exercises today Pt agrees with  plan. Update if not improving as expected.    Relevant Orders      Lower Extremity Venous Duplex Right       Follow up plan: Return if symptoms worsen or fail to improve.

## 2013-09-07 NOTE — Assessment & Plan Note (Signed)
Not consistent with cellulitis, DVT or PAD/claudication. Given ESRD on HD and other risk factors, will check RLE [place] to r/o DVT. Anticipate R calf strain - treat with heating pad, elevation of leg and compression (placed in ace bandage today). Provided with calf strain rehab exercises today Pt agrees with plan. Update if not improving as expected.

## 2013-09-07 NOTE — Patient Instructions (Addendum)
I would like to set you up for ultrasound of your right leg to help rule out blood clot. I think you have right sided calf strain - treat with elevation of leg, heating pad to calf, ad may use tylenol for discomfort. I also want you to start doing calf strain rehab exercises provided today. Seek urgent care if any worsening or not improving as expected.

## 2013-09-07 NOTE — Telephone Encounter (Signed)
Parker at [facility] [place] called report on Right calf US---Neg for DVT. I advised it was fine for patient to leave and we would call her with the results.

## 2013-09-07 NOTE — Progress Notes (Signed)
Pre visit review using our clinic review tool, if applicable. No additional management support is needed unless otherwise documented below in the visit note. 

## 2013-09-07 NOTE — Telephone Encounter (Signed)
Per Dr. Langston Reusing notified and advised to continue plan discussed at [place] today. She verbalized understanding.

## 2013-09-10 DIAGNOSIS — D631 Anemia in chronic kidney disease: Secondary | ICD-10-CM | POA: Diagnosis not present

## 2013-09-10 DIAGNOSIS — N186 End stage renal disease: Secondary | ICD-10-CM | POA: Diagnosis not present

## 2013-09-10 DIAGNOSIS — E1129 Type 2 diabetes mellitus with other diabetic kidney complication: Secondary | ICD-10-CM | POA: Diagnosis not present

## 2013-09-12 DIAGNOSIS — N039 Chronic nephritic syndrome with unspecified morphologic changes: Secondary | ICD-10-CM | POA: Diagnosis not present

## 2013-09-12 DIAGNOSIS — N186 End stage renal disease: Secondary | ICD-10-CM | POA: Diagnosis not present

## 2013-09-12 DIAGNOSIS — D631 Anemia in chronic kidney disease: Secondary | ICD-10-CM | POA: Diagnosis not present

## 2013-09-12 DIAGNOSIS — E1129 Type 2 diabetes mellitus with other diabetic kidney complication: Secondary | ICD-10-CM | POA: Diagnosis not present

## 2013-09-14 DIAGNOSIS — E1129 Type 2 diabetes mellitus with other diabetic kidney complication: Secondary | ICD-10-CM | POA: Diagnosis not present

## 2013-09-14 DIAGNOSIS — D631 Anemia in chronic kidney disease: Secondary | ICD-10-CM | POA: Diagnosis not present

## 2013-09-14 DIAGNOSIS — N186 End stage renal disease: Secondary | ICD-10-CM | POA: Diagnosis not present

## 2013-09-17 DIAGNOSIS — D631 Anemia in chronic kidney disease: Secondary | ICD-10-CM | POA: Diagnosis not present

## 2013-09-17 DIAGNOSIS — N039 Chronic nephritic syndrome with unspecified morphologic changes: Secondary | ICD-10-CM | POA: Diagnosis not present

## 2013-09-17 DIAGNOSIS — N186 End stage renal disease: Secondary | ICD-10-CM | POA: Diagnosis not present

## 2013-09-17 DIAGNOSIS — E1129 Type 2 diabetes mellitus with other diabetic kidney complication: Secondary | ICD-10-CM | POA: Diagnosis not present

## 2013-09-19 DIAGNOSIS — N186 End stage renal disease: Secondary | ICD-10-CM | POA: Diagnosis not present

## 2013-09-19 DIAGNOSIS — D631 Anemia in chronic kidney disease: Secondary | ICD-10-CM | POA: Diagnosis not present

## 2013-09-19 DIAGNOSIS — E1129 Type 2 diabetes mellitus with other diabetic kidney complication: Secondary | ICD-10-CM | POA: Diagnosis not present

## 2013-09-21 DIAGNOSIS — D631 Anemia in chronic kidney disease: Secondary | ICD-10-CM | POA: Diagnosis not present

## 2013-09-21 DIAGNOSIS — E1129 Type 2 diabetes mellitus with other diabetic kidney complication: Secondary | ICD-10-CM | POA: Diagnosis not present

## 2013-09-21 DIAGNOSIS — N186 End stage renal disease: Secondary | ICD-10-CM | POA: Diagnosis not present

## 2013-09-24 DIAGNOSIS — N186 End stage renal disease: Secondary | ICD-10-CM | POA: Diagnosis not present

## 2013-09-24 DIAGNOSIS — D631 Anemia in chronic kidney disease: Secondary | ICD-10-CM | POA: Diagnosis not present

## 2013-09-24 DIAGNOSIS — N039 Chronic nephritic syndrome with unspecified morphologic changes: Secondary | ICD-10-CM | POA: Diagnosis not present

## 2013-09-24 DIAGNOSIS — E1129 Type 2 diabetes mellitus with other diabetic kidney complication: Secondary | ICD-10-CM | POA: Diagnosis not present

## 2013-09-25 DIAGNOSIS — N186 End stage renal disease: Secondary | ICD-10-CM | POA: Diagnosis not present

## 2013-09-26 DIAGNOSIS — E1129 Type 2 diabetes mellitus with other diabetic kidney complication: Secondary | ICD-10-CM | POA: Diagnosis not present

## 2013-09-26 DIAGNOSIS — N186 End stage renal disease: Secondary | ICD-10-CM | POA: Diagnosis not present

## 2013-09-26 DIAGNOSIS — D509 Iron deficiency anemia, unspecified: Secondary | ICD-10-CM | POA: Diagnosis not present

## 2013-09-26 DIAGNOSIS — A488 Other specified bacterial diseases: Secondary | ICD-10-CM | POA: Diagnosis not present

## 2013-09-26 DIAGNOSIS — D631 Anemia in chronic kidney disease: Secondary | ICD-10-CM | POA: Diagnosis not present

## 2013-09-28 DIAGNOSIS — N186 End stage renal disease: Secondary | ICD-10-CM | POA: Diagnosis not present

## 2013-09-28 DIAGNOSIS — E1129 Type 2 diabetes mellitus with other diabetic kidney complication: Secondary | ICD-10-CM | POA: Diagnosis not present

## 2013-09-28 DIAGNOSIS — D631 Anemia in chronic kidney disease: Secondary | ICD-10-CM | POA: Diagnosis not present

## 2013-09-28 DIAGNOSIS — A488 Other specified bacterial diseases: Secondary | ICD-10-CM | POA: Diagnosis not present

## 2013-09-28 DIAGNOSIS — D509 Iron deficiency anemia, unspecified: Secondary | ICD-10-CM | POA: Diagnosis not present

## 2013-10-01 DIAGNOSIS — E1129 Type 2 diabetes mellitus with other diabetic kidney complication: Secondary | ICD-10-CM | POA: Diagnosis not present

## 2013-10-01 DIAGNOSIS — D631 Anemia in chronic kidney disease: Secondary | ICD-10-CM | POA: Diagnosis not present

## 2013-10-01 DIAGNOSIS — A488 Other specified bacterial diseases: Secondary | ICD-10-CM | POA: Diagnosis not present

## 2013-10-01 DIAGNOSIS — D509 Iron deficiency anemia, unspecified: Secondary | ICD-10-CM | POA: Diagnosis not present

## 2013-10-01 DIAGNOSIS — N186 End stage renal disease: Secondary | ICD-10-CM | POA: Diagnosis not present

## 2013-10-03 DIAGNOSIS — D631 Anemia in chronic kidney disease: Secondary | ICD-10-CM | POA: Diagnosis not present

## 2013-10-03 DIAGNOSIS — N186 End stage renal disease: Secondary | ICD-10-CM | POA: Diagnosis not present

## 2013-10-03 DIAGNOSIS — D509 Iron deficiency anemia, unspecified: Secondary | ICD-10-CM | POA: Diagnosis not present

## 2013-10-03 DIAGNOSIS — N039 Chronic nephritic syndrome with unspecified morphologic changes: Secondary | ICD-10-CM | POA: Diagnosis not present

## 2013-10-03 DIAGNOSIS — A488 Other specified bacterial diseases: Secondary | ICD-10-CM | POA: Diagnosis not present

## 2013-10-03 DIAGNOSIS — E1129 Type 2 diabetes mellitus with other diabetic kidney complication: Secondary | ICD-10-CM | POA: Diagnosis not present

## 2013-10-05 DIAGNOSIS — E1129 Type 2 diabetes mellitus with other diabetic kidney complication: Secondary | ICD-10-CM | POA: Diagnosis not present

## 2013-10-05 DIAGNOSIS — D631 Anemia in chronic kidney disease: Secondary | ICD-10-CM | POA: Diagnosis not present

## 2013-10-05 DIAGNOSIS — N186 End stage renal disease: Secondary | ICD-10-CM | POA: Diagnosis not present

## 2013-10-05 DIAGNOSIS — A488 Other specified bacterial diseases: Secondary | ICD-10-CM | POA: Diagnosis not present

## 2013-10-05 DIAGNOSIS — D509 Iron deficiency anemia, unspecified: Secondary | ICD-10-CM | POA: Diagnosis not present

## 2013-10-08 DIAGNOSIS — A488 Other specified bacterial diseases: Secondary | ICD-10-CM | POA: Diagnosis not present

## 2013-10-08 DIAGNOSIS — E1129 Type 2 diabetes mellitus with other diabetic kidney complication: Secondary | ICD-10-CM | POA: Diagnosis not present

## 2013-10-08 DIAGNOSIS — D509 Iron deficiency anemia, unspecified: Secondary | ICD-10-CM | POA: Diagnosis not present

## 2013-10-08 DIAGNOSIS — N186 End stage renal disease: Secondary | ICD-10-CM | POA: Diagnosis not present

## 2013-10-08 DIAGNOSIS — D631 Anemia in chronic kidney disease: Secondary | ICD-10-CM | POA: Diagnosis not present

## 2013-10-10 DIAGNOSIS — A488 Other specified bacterial diseases: Secondary | ICD-10-CM | POA: Diagnosis not present

## 2013-10-10 DIAGNOSIS — E1129 Type 2 diabetes mellitus with other diabetic kidney complication: Secondary | ICD-10-CM | POA: Diagnosis not present

## 2013-10-10 DIAGNOSIS — N186 End stage renal disease: Secondary | ICD-10-CM | POA: Diagnosis not present

## 2013-10-10 DIAGNOSIS — N039 Chronic nephritic syndrome with unspecified morphologic changes: Secondary | ICD-10-CM | POA: Diagnosis not present

## 2013-10-10 DIAGNOSIS — D631 Anemia in chronic kidney disease: Secondary | ICD-10-CM | POA: Diagnosis not present

## 2013-10-10 DIAGNOSIS — D509 Iron deficiency anemia, unspecified: Secondary | ICD-10-CM | POA: Diagnosis not present

## 2013-10-11 DIAGNOSIS — A488 Other specified bacterial diseases: Secondary | ICD-10-CM | POA: Diagnosis not present

## 2013-10-11 DIAGNOSIS — D509 Iron deficiency anemia, unspecified: Secondary | ICD-10-CM | POA: Diagnosis not present

## 2013-10-11 DIAGNOSIS — D631 Anemia in chronic kidney disease: Secondary | ICD-10-CM | POA: Diagnosis not present

## 2013-10-11 DIAGNOSIS — E1129 Type 2 diabetes mellitus with other diabetic kidney complication: Secondary | ICD-10-CM | POA: Diagnosis not present

## 2013-10-11 DIAGNOSIS — N186 End stage renal disease: Secondary | ICD-10-CM | POA: Diagnosis not present

## 2013-10-11 DIAGNOSIS — N039 Chronic nephritic syndrome with unspecified morphologic changes: Secondary | ICD-10-CM | POA: Diagnosis not present

## 2013-10-12 DIAGNOSIS — A488 Other specified bacterial diseases: Secondary | ICD-10-CM | POA: Diagnosis not present

## 2013-10-12 DIAGNOSIS — D631 Anemia in chronic kidney disease: Secondary | ICD-10-CM | POA: Diagnosis not present

## 2013-10-12 DIAGNOSIS — N186 End stage renal disease: Secondary | ICD-10-CM | POA: Diagnosis not present

## 2013-10-12 DIAGNOSIS — E1129 Type 2 diabetes mellitus with other diabetic kidney complication: Secondary | ICD-10-CM | POA: Diagnosis not present

## 2013-10-12 DIAGNOSIS — N039 Chronic nephritic syndrome with unspecified morphologic changes: Secondary | ICD-10-CM | POA: Diagnosis not present

## 2013-10-12 DIAGNOSIS — D509 Iron deficiency anemia, unspecified: Secondary | ICD-10-CM | POA: Diagnosis not present

## 2013-10-15 DIAGNOSIS — E1129 Type 2 diabetes mellitus with other diabetic kidney complication: Secondary | ICD-10-CM | POA: Diagnosis not present

## 2013-10-15 DIAGNOSIS — N039 Chronic nephritic syndrome with unspecified morphologic changes: Secondary | ICD-10-CM | POA: Diagnosis not present

## 2013-10-15 DIAGNOSIS — D509 Iron deficiency anemia, unspecified: Secondary | ICD-10-CM | POA: Diagnosis not present

## 2013-10-15 DIAGNOSIS — N186 End stage renal disease: Secondary | ICD-10-CM | POA: Diagnosis not present

## 2013-10-15 DIAGNOSIS — D631 Anemia in chronic kidney disease: Secondary | ICD-10-CM | POA: Diagnosis not present

## 2013-10-15 DIAGNOSIS — A488 Other specified bacterial diseases: Secondary | ICD-10-CM | POA: Diagnosis not present

## 2013-10-16 DIAGNOSIS — I10 Essential (primary) hypertension: Secondary | ICD-10-CM | POA: Diagnosis not present

## 2013-10-16 DIAGNOSIS — N186 End stage renal disease: Secondary | ICD-10-CM | POA: Diagnosis not present

## 2013-10-16 DIAGNOSIS — C50019 Malignant neoplasm of nipple and areola, unspecified female breast: Secondary | ICD-10-CM | POA: Diagnosis not present

## 2013-10-16 DIAGNOSIS — T82898A Other specified complication of vascular prosthetic devices, implants and grafts, initial encounter: Secondary | ICD-10-CM | POA: Diagnosis not present

## 2013-10-17 DIAGNOSIS — A488 Other specified bacterial diseases: Secondary | ICD-10-CM | POA: Diagnosis not present

## 2013-10-17 DIAGNOSIS — D631 Anemia in chronic kidney disease: Secondary | ICD-10-CM | POA: Diagnosis not present

## 2013-10-17 DIAGNOSIS — N186 End stage renal disease: Secondary | ICD-10-CM | POA: Diagnosis not present

## 2013-10-17 DIAGNOSIS — E1129 Type 2 diabetes mellitus with other diabetic kidney complication: Secondary | ICD-10-CM | POA: Diagnosis not present

## 2013-10-17 DIAGNOSIS — D509 Iron deficiency anemia, unspecified: Secondary | ICD-10-CM | POA: Diagnosis not present

## 2013-10-19 DIAGNOSIS — D631 Anemia in chronic kidney disease: Secondary | ICD-10-CM | POA: Diagnosis not present

## 2013-10-19 DIAGNOSIS — E1129 Type 2 diabetes mellitus with other diabetic kidney complication: Secondary | ICD-10-CM | POA: Diagnosis not present

## 2013-10-19 DIAGNOSIS — A488 Other specified bacterial diseases: Secondary | ICD-10-CM | POA: Diagnosis not present

## 2013-10-19 DIAGNOSIS — D509 Iron deficiency anemia, unspecified: Secondary | ICD-10-CM | POA: Diagnosis not present

## 2013-10-19 DIAGNOSIS — N186 End stage renal disease: Secondary | ICD-10-CM | POA: Diagnosis not present

## 2013-10-22 DIAGNOSIS — D509 Iron deficiency anemia, unspecified: Secondary | ICD-10-CM | POA: Diagnosis not present

## 2013-10-22 DIAGNOSIS — N186 End stage renal disease: Secondary | ICD-10-CM | POA: Diagnosis not present

## 2013-10-22 DIAGNOSIS — D631 Anemia in chronic kidney disease: Secondary | ICD-10-CM | POA: Diagnosis not present

## 2013-10-22 DIAGNOSIS — E1129 Type 2 diabetes mellitus with other diabetic kidney complication: Secondary | ICD-10-CM | POA: Diagnosis not present

## 2013-10-22 DIAGNOSIS — A488 Other specified bacterial diseases: Secondary | ICD-10-CM | POA: Diagnosis not present

## 2013-10-22 DIAGNOSIS — N039 Chronic nephritic syndrome with unspecified morphologic changes: Secondary | ICD-10-CM | POA: Diagnosis not present

## 2013-10-23 ENCOUNTER — Ambulatory Visit: Payer: Self-pay | Admitting: Vascular Surgery

## 2013-10-23 DIAGNOSIS — I498 Other specified cardiac arrhythmias: Secondary | ICD-10-CM | POA: Diagnosis not present

## 2013-10-23 DIAGNOSIS — I120 Hypertensive chronic kidney disease with stage 5 chronic kidney disease or end stage renal disease: Secondary | ICD-10-CM | POA: Diagnosis not present

## 2013-10-23 DIAGNOSIS — Z01810 Encounter for preprocedural cardiovascular examination: Secondary | ICD-10-CM | POA: Diagnosis not present

## 2013-10-23 DIAGNOSIS — Z01812 Encounter for preprocedural laboratory examination: Secondary | ICD-10-CM | POA: Diagnosis not present

## 2013-10-23 DIAGNOSIS — Z01818 Encounter for other preprocedural examination: Secondary | ICD-10-CM | POA: Diagnosis not present

## 2013-10-23 DIAGNOSIS — N186 End stage renal disease: Secondary | ICD-10-CM | POA: Diagnosis not present

## 2013-10-23 LAB — BASIC METABOLIC PANEL
Anion Gap: 8 (ref 7–16)
BUN: 49 mg/dL — AB (ref 7–18)
CREATININE: 5.95 mg/dL — AB (ref 0.60–1.30)
Calcium, Total: 9 mg/dL (ref 8.5–10.1)
Chloride: 98 mmol/L (ref 98–107)
Co2: 30 mmol/L (ref 21–32)
GFR CALC AF AMER: 7 — AB
GFR CALC NON AF AMER: 6 — AB
GLUCOSE: 231 mg/dL — AB (ref 65–99)
Osmolality: 292 (ref 275–301)
Potassium: 5.5 mmol/L — ABNORMAL HIGH (ref 3.5–5.1)
SODIUM: 136 mmol/L (ref 136–145)

## 2013-10-23 LAB — CBC
HCT: 31.4 % — AB (ref 35.0–47.0)
HGB: 9.7 g/dL — AB (ref 12.0–16.0)
MCH: 30.3 pg (ref 26.0–34.0)
MCHC: 31 g/dL — ABNORMAL LOW (ref 32.0–36.0)
MCV: 98 fL (ref 80–100)
Platelet: 218 10*3/uL (ref 150–440)
RBC: 3.21 10*6/uL — ABNORMAL LOW (ref 3.80–5.20)
RDW: 16.2 % — AB (ref 11.5–14.5)
WBC: 7.1 10*3/uL (ref 3.6–11.0)

## 2013-10-24 DIAGNOSIS — D631 Anemia in chronic kidney disease: Secondary | ICD-10-CM | POA: Diagnosis not present

## 2013-10-24 DIAGNOSIS — A488 Other specified bacterial diseases: Secondary | ICD-10-CM | POA: Diagnosis not present

## 2013-10-24 DIAGNOSIS — D509 Iron deficiency anemia, unspecified: Secondary | ICD-10-CM | POA: Diagnosis not present

## 2013-10-24 DIAGNOSIS — N186 End stage renal disease: Secondary | ICD-10-CM | POA: Diagnosis not present

## 2013-10-24 DIAGNOSIS — E1129 Type 2 diabetes mellitus with other diabetic kidney complication: Secondary | ICD-10-CM | POA: Diagnosis not present

## 2013-10-26 DIAGNOSIS — D509 Iron deficiency anemia, unspecified: Secondary | ICD-10-CM | POA: Diagnosis not present

## 2013-10-26 DIAGNOSIS — D631 Anemia in chronic kidney disease: Secondary | ICD-10-CM | POA: Diagnosis not present

## 2013-10-26 DIAGNOSIS — N186 End stage renal disease: Secondary | ICD-10-CM | POA: Diagnosis not present

## 2013-10-26 DIAGNOSIS — A488 Other specified bacterial diseases: Secondary | ICD-10-CM | POA: Diagnosis not present

## 2013-10-26 DIAGNOSIS — E1129 Type 2 diabetes mellitus with other diabetic kidney complication: Secondary | ICD-10-CM | POA: Diagnosis not present

## 2013-10-29 DIAGNOSIS — D631 Anemia in chronic kidney disease: Secondary | ICD-10-CM | POA: Diagnosis not present

## 2013-10-29 DIAGNOSIS — L03818 Cellulitis of other sites: Secondary | ICD-10-CM | POA: Diagnosis not present

## 2013-10-29 DIAGNOSIS — L02818 Cutaneous abscess of other sites: Secondary | ICD-10-CM | POA: Diagnosis not present

## 2013-10-29 DIAGNOSIS — N186 End stage renal disease: Secondary | ICD-10-CM | POA: Diagnosis not present

## 2013-10-29 DIAGNOSIS — E1129 Type 2 diabetes mellitus with other diabetic kidney complication: Secondary | ICD-10-CM | POA: Diagnosis not present

## 2013-10-30 ENCOUNTER — Encounter: Payer: Self-pay | Admitting: Cardiology

## 2013-10-30 ENCOUNTER — Ambulatory Visit (INDEPENDENT_AMBULATORY_CARE_PROVIDER_SITE_OTHER): Payer: Medicare Other | Admitting: Cardiology

## 2013-10-30 VITALS — BP 150/0 | HR 88 | Ht 59.0 in | Wt 111.6 lb

## 2013-10-30 DIAGNOSIS — I10 Essential (primary) hypertension: Secondary | ICD-10-CM | POA: Diagnosis not present

## 2013-10-30 DIAGNOSIS — I498 Other specified cardiac arrhythmias: Secondary | ICD-10-CM | POA: Diagnosis not present

## 2013-10-30 DIAGNOSIS — R011 Cardiac murmur, unspecified: Secondary | ICD-10-CM

## 2013-10-30 DIAGNOSIS — R9431 Abnormal electrocardiogram [ECG] [EKG]: Secondary | ICD-10-CM

## 2013-10-30 DIAGNOSIS — L03818 Cellulitis of other sites: Secondary | ICD-10-CM | POA: Diagnosis not present

## 2013-10-30 DIAGNOSIS — E1129 Type 2 diabetes mellitus with other diabetic kidney complication: Secondary | ICD-10-CM | POA: Diagnosis not present

## 2013-10-30 DIAGNOSIS — Z01818 Encounter for other preprocedural examination: Secondary | ICD-10-CM | POA: Diagnosis not present

## 2013-10-30 DIAGNOSIS — R001 Bradycardia, unspecified: Secondary | ICD-10-CM

## 2013-10-30 DIAGNOSIS — N186 End stage renal disease: Secondary | ICD-10-CM | POA: Diagnosis not present

## 2013-10-30 DIAGNOSIS — L02818 Cutaneous abscess of other sites: Secondary | ICD-10-CM | POA: Diagnosis not present

## 2013-10-30 DIAGNOSIS — D631 Anemia in chronic kidney disease: Secondary | ICD-10-CM | POA: Diagnosis not present

## 2013-10-30 NOTE — Assessment & Plan Note (Signed)
No change from 06/2012, negative nuc study at that time for ischemia.  She is cleared for surgery.

## 2013-10-30 NOTE — Assessment & Plan Note (Signed)
No change, from previous.

## 2013-10-30 NOTE — Patient Instructions (Signed)
Your physician wants you to follow-up in: 6 MONTHS WITH DR CRENSHAW You will receive a reminder letter in the mail two months in advance. If you don't receive a letter, please call our office to schedule the follow-up appointment.  

## 2013-10-30 NOTE — Assessment & Plan Note (Signed)
BP taken in Lt lower leg.  With doppler, systolic BP 281.  She has not had meds today because she had dialysis earlier today.

## 2013-10-30 NOTE — Progress Notes (Signed)
10/30/2013   PCP: [name], MD   Chief Complaint  Patient presents with  . Pre-op Exam    dialysis graft surgery    Primary Cardiologist: Dr. Thresa Ross   HPI:  80 year old female with hx as below, here today for pre surgical clearance.  She needs Left AV graft on 10/31/13 though pt tells me it may be the next week.  Her EKG was abnormal for preop check. With undetermined age of inf. And ant Mi.  The EKG was compared to  One done before these changes were noted in 2014.  Dr. Stanford Breed saw in April the 29th of 2014 for bradycardia and hypotension and murmur.  She was asymptomatic.  Stress test was negative for ischemia and Echo 07/27/12:  Left ventricle: The cavity size was normal. Wall thickness was increased in a pattern of moderate LVH. Systolic function was vigorous. The estimated ejection fraction was in the range of 65% to 70%. Wall motion was normal; there were no regional wall motion abnormalities. Doppler parameters are consistent with abnormal left ventricular relaxation (grade 1 diastolic dysfunction). - Aortic valve: Trivial regurgitation. - Mitral valve: Calcified annulus. Mildly thickened leaflets . - Left atrium: The atrium was moderately dilated. - Pulmonary arteries: PA peak pressure: 73mm Hg (S).  Pt denies any chest pain or SOB.  She feels quite well.  Her EKG has not changed from EKG post nuc study.      Allergies  Allergen Reactions  . Sulfa Antibiotics Rash    Current Outpatient Prescriptions  Medication Sig Dispense Refill  . amLODipine (NORVASC) 5 MG tablet Take 5 mg by mouth See admin instructions. Daily except do not take on dialysis days (mon,wed and fri)      . Artificial Tear Ointment (REFRESH P.M. OP) Place 1 application into the right eye daily.      . B Complex-C-Folic Acid (RENA-VITE PO) Take 1 tablet by mouth daily with breakfast.       . Calcium Carbonate Antacid (TUMS ULTRA 1000 PO) Take 2,000 mg by mouth 3 (three)  times daily with meals.       Marland Kitchen omeprazole (PRILOSEC) 20 MG capsule Take 20 mg by mouth daily.      . simvastatin (ZOCOR) 20 MG tablet Take 20 mg by mouth at bedtime.        No current facility-administered medications for this visit.    Past Medical History  Diagnosis Date  . Type 2 diabetes with nephropathy     resolved with weight loss  . Hyperlipidemia   . Hypertension   . Anxiety   . Facial droop 1935    acquired during forceps delivery, some residual R visual loss  . Anemia in chronic kidney disease     IV iron infusion  . Hx of breast cancer 2004  . Arthritis     mild in lower back  . End stage renal disease on dialysis     HD M,W,F [place] AVF now LUE AVF  . GERD (gastroesophageal reflux disease)   . Cardiac murmur     a. thought due to AVF (2D echo 06/2012 without significant valvular disease).   . Bradycardia     a. Noted 06/2012.  Marland Kitchen Hypotension     a. Associated w/ dialysis.  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 [name])  . Family history of anesthesia complication  Brother- confusion    Past Surgical History  Procedure Laterality Date  . Cataract extraction  1981  . Av fistula placement  07/20/10    Right brachiocephalic AVF  . Umbilical hernia repair    . Dexa  2013    [facility]  . US echocardiography  06/2012    LVH, EF 65%, grade 1 diastol dysfunction, mod dliated LAD  . Eye surgery  1966    regular cataract  . Breast lumpectomy Left   . Breast biopsy Left   . Bascilic vein transposition Left 07/24/2013    Procedure: BASILIC VEIN TRANSPOSITION;  Surgeon: [name], MD;  Location: [facility] OR;  Service: Vascular;  Laterality: Left;    GBT:DVVOHYW:VP colds or fevers, no weight changes Skin:no rashes or ulcers HEENT:no blurred vision, no congestion CV:see HPI PUL:see HPI GI:no diarrhea constipation or melena, no indigestion GU:no hematuria, no dysuria MS:no joint pain, no claudication Neuro:no syncope,  no lightheadedness Endo:+ diabetes, no thyroid disease ESRD on HD T-Th-Sat  Wt Readings from Last 3 Encounters:  10/30/13 111 lb 9.6 oz (50.621 kg)  09/07/13 111 lb 12 oz (50.689 kg)  09/06/13 115 lb (52.164 kg)    PHYSICAL EXAM BP 150/0  Pulse 88  Ht 4\' 11"  (1.499 m)  Wt 111 lb 9.6 oz (50.621 kg)  BMI 22.53 kg/m2 BP with doppler in Left leg.  Did not take meds today. General:Pleasant affect, NAD Skin:Warm and dry, brisk capillary refill HEENT:normocephalic, sclera clear, mucus membranes moist, rt facial droop  Neck:supple, no JVD, no bruits, no adenopathy.   Heart:S1S2 RRR with 2/6 systolic murmur, no gallup, rub or click Lungs:clear without rales, rhonchi, or wheezes XTG:GYIR, non tender, + BS, do not palpate liver spleen or masses Ext:no lower ext edema, 2+ pedal pulses, 2+ radial pulses Neuro:alert and oriented, MAE, follows commands, + facial symmetry  EKG:SR Prior anterior and inferior infarct cannot be excluded- EKG 07/24/12 and on 07/25/12 SR with prior inferior infarct.  Comparing these EKGs to todays EKG and October 23, 2013 all are the same.  SR ith old inf ischemia and poor R wave progression of ant. Leads.     ASSESSMENT AND PLAN Abnormal electrocardiogram No change from 06/2012, negative nuc study at that time for ischemia.  She is cleared for surgery.    Encounter for preoperative examination for general surgical procedure Pt's EKG is without change for over 1 year.  No chest pain, no SOB, negative nuclear stress test 1 year ago.  Ok for Left AV graft.  Bradycardia None noted HR 88  Hypertension BP taken in Lt lower leg.  With doppler, systolic BP 485.  She has not had meds today because she had dialysis earlier today.  Systolic murmur No change, from previous.     Follow up with Dr. Stanford Breed in 6 months unless problems before.

## 2013-10-30 NOTE — Assessment & Plan Note (Signed)
Pt's EKG is without change for over 1 year.  No chest pain, no SOB, negative nuclear stress test 1 year ago.  Ok for Left AV graft.

## 2013-10-30 NOTE — Assessment & Plan Note (Signed)
None noted HR 88

## 2013-10-31 DIAGNOSIS — I10 Essential (primary) hypertension: Secondary | ICD-10-CM | POA: Diagnosis not present

## 2013-11-01 ENCOUNTER — Ambulatory Visit: Payer: Medicare Other | Admitting: Vascular Surgery

## 2013-11-01 ENCOUNTER — Other Ambulatory Visit (HOSPITAL_COMMUNITY): Payer: Medicare Other

## 2013-11-02 DIAGNOSIS — D631 Anemia in chronic kidney disease: Secondary | ICD-10-CM | POA: Diagnosis not present

## 2013-11-02 DIAGNOSIS — L02818 Cutaneous abscess of other sites: Secondary | ICD-10-CM | POA: Diagnosis not present

## 2013-11-02 DIAGNOSIS — N186 End stage renal disease: Secondary | ICD-10-CM | POA: Diagnosis not present

## 2013-11-02 DIAGNOSIS — E1129 Type 2 diabetes mellitus with other diabetic kidney complication: Secondary | ICD-10-CM | POA: Diagnosis not present

## 2013-11-02 DIAGNOSIS — L03818 Cellulitis of other sites: Secondary | ICD-10-CM | POA: Diagnosis not present

## 2013-11-05 DIAGNOSIS — N186 End stage renal disease: Secondary | ICD-10-CM | POA: Diagnosis not present

## 2013-11-05 DIAGNOSIS — D631 Anemia in chronic kidney disease: Secondary | ICD-10-CM | POA: Diagnosis not present

## 2013-11-05 DIAGNOSIS — L02818 Cutaneous abscess of other sites: Secondary | ICD-10-CM | POA: Diagnosis not present

## 2013-11-05 DIAGNOSIS — N039 Chronic nephritic syndrome with unspecified morphologic changes: Secondary | ICD-10-CM | POA: Diagnosis not present

## 2013-11-05 DIAGNOSIS — E1129 Type 2 diabetes mellitus with other diabetic kidney complication: Secondary | ICD-10-CM | POA: Diagnosis not present

## 2013-11-06 DIAGNOSIS — E1129 Type 2 diabetes mellitus with other diabetic kidney complication: Secondary | ICD-10-CM | POA: Diagnosis not present

## 2013-11-06 DIAGNOSIS — D631 Anemia in chronic kidney disease: Secondary | ICD-10-CM | POA: Diagnosis not present

## 2013-11-06 DIAGNOSIS — L02818 Cutaneous abscess of other sites: Secondary | ICD-10-CM | POA: Diagnosis not present

## 2013-11-06 DIAGNOSIS — N186 End stage renal disease: Secondary | ICD-10-CM | POA: Diagnosis not present

## 2013-11-07 ENCOUNTER — Ambulatory Visit: Payer: Self-pay | Admitting: Vascular Surgery

## 2013-11-07 DIAGNOSIS — K219 Gastro-esophageal reflux disease without esophagitis: Secondary | ICD-10-CM | POA: Diagnosis not present

## 2013-11-07 DIAGNOSIS — Z882 Allergy status to sulfonamides status: Secondary | ICD-10-CM | POA: Diagnosis not present

## 2013-11-07 DIAGNOSIS — R011 Cardiac murmur, unspecified: Secondary | ICD-10-CM | POA: Diagnosis not present

## 2013-11-07 DIAGNOSIS — I498 Other specified cardiac arrhythmias: Secondary | ICD-10-CM | POA: Diagnosis not present

## 2013-11-07 DIAGNOSIS — Z853 Personal history of malignant neoplasm of breast: Secondary | ICD-10-CM | POA: Diagnosis not present

## 2013-11-07 DIAGNOSIS — Z800 Family history of malignant neoplasm of digestive organs: Secondary | ICD-10-CM | POA: Diagnosis not present

## 2013-11-07 DIAGNOSIS — Z8249 Family history of ischemic heart disease and other diseases of the circulatory system: Secondary | ICD-10-CM | POA: Diagnosis not present

## 2013-11-07 DIAGNOSIS — N186 End stage renal disease: Secondary | ICD-10-CM | POA: Diagnosis not present

## 2013-11-07 DIAGNOSIS — M545 Low back pain, unspecified: Secondary | ICD-10-CM | POA: Diagnosis not present

## 2013-11-07 DIAGNOSIS — Z8489 Family history of other specified conditions: Secondary | ICD-10-CM | POA: Diagnosis not present

## 2013-11-07 DIAGNOSIS — I10 Essential (primary) hypertension: Secondary | ICD-10-CM | POA: Diagnosis not present

## 2013-11-07 DIAGNOSIS — Z992 Dependence on renal dialysis: Secondary | ICD-10-CM | POA: Diagnosis not present

## 2013-11-07 DIAGNOSIS — Z79899 Other long term (current) drug therapy: Secondary | ICD-10-CM | POA: Diagnosis not present

## 2013-11-07 DIAGNOSIS — I120 Hypertensive chronic kidney disease with stage 5 chronic kidney disease or end stage renal disease: Secondary | ICD-10-CM | POA: Diagnosis not present

## 2013-11-07 DIAGNOSIS — M129 Arthropathy, unspecified: Secondary | ICD-10-CM | POA: Diagnosis not present

## 2013-11-07 LAB — POTASSIUM: Potassium: 3.7 mmol/L (ref 3.5–5.1)

## 2013-11-09 DIAGNOSIS — N039 Chronic nephritic syndrome with unspecified morphologic changes: Secondary | ICD-10-CM | POA: Diagnosis not present

## 2013-11-09 DIAGNOSIS — L02818 Cutaneous abscess of other sites: Secondary | ICD-10-CM | POA: Diagnosis not present

## 2013-11-09 DIAGNOSIS — D631 Anemia in chronic kidney disease: Secondary | ICD-10-CM | POA: Diagnosis not present

## 2013-11-09 DIAGNOSIS — N186 End stage renal disease: Secondary | ICD-10-CM | POA: Diagnosis not present

## 2013-11-09 DIAGNOSIS — E1129 Type 2 diabetes mellitus with other diabetic kidney complication: Secondary | ICD-10-CM | POA: Diagnosis not present

## 2013-11-12 DIAGNOSIS — D631 Anemia in chronic kidney disease: Secondary | ICD-10-CM | POA: Diagnosis not present

## 2013-11-12 DIAGNOSIS — N186 End stage renal disease: Secondary | ICD-10-CM | POA: Diagnosis not present

## 2013-11-12 DIAGNOSIS — L03818 Cellulitis of other sites: Secondary | ICD-10-CM | POA: Diagnosis not present

## 2013-11-12 DIAGNOSIS — E1129 Type 2 diabetes mellitus with other diabetic kidney complication: Secondary | ICD-10-CM | POA: Diagnosis not present

## 2013-11-12 DIAGNOSIS — L02818 Cutaneous abscess of other sites: Secondary | ICD-10-CM | POA: Diagnosis not present

## 2013-11-13 ENCOUNTER — Encounter: Payer: Self-pay | Admitting: Family Medicine

## 2013-11-13 ENCOUNTER — Ambulatory Visit (INDEPENDENT_AMBULATORY_CARE_PROVIDER_SITE_OTHER): Payer: Medicare Other | Admitting: Family Medicine

## 2013-11-13 VITALS — BP 146/74 | HR 80 | Temp 97.9°F | Wt 114.5 lb

## 2013-11-13 DIAGNOSIS — J3489 Other specified disorders of nose and nasal sinuses: Secondary | ICD-10-CM

## 2013-11-13 DIAGNOSIS — M25649 Stiffness of unspecified hand, not elsewhere classified: Secondary | ICD-10-CM

## 2013-11-13 DIAGNOSIS — R0981 Nasal congestion: Secondary | ICD-10-CM | POA: Insufficient documentation

## 2013-11-13 DIAGNOSIS — M19049 Primary osteoarthritis, unspecified hand: Secondary | ICD-10-CM | POA: Insufficient documentation

## 2013-11-13 MED ORDER — FLUTICASONE PROPIONATE 50 MCG/ACT NA SUSP: 2.0000 | Freq: Every day | NASAL | Status: DC

## 2013-11-13 NOTE — Patient Instructions (Signed)
Let's monitor hand stiffness for now. Today looking ok. For left nasal obstruction - may try flonase 1-2 puffs into left nostril daily. If not helping let me know we could have you see a ENT doctor. Continue nasal saline.

## 2013-11-13 NOTE — Progress Notes (Signed)
BP 146/74  Pulse 80  Temp(Src) 97.9 F (36.6 C) (Oral)  Wt 114 lb 8 oz (51.937 kg)   CC: discuss arthritis  Subjective:    Patient ID: [name], female    DOB: 03-23-1932, 80 y.o.   MRN: [id number]  HPI: [name] is a 80 y.o. female presenting on 11/13/2013 for Arthritis and Nose Problem   Notices some trouble breathing out of L nostril - ongoing for years. This is getting worse. Continued drainage down back of throat, uses nasal saline which doesn't really help. Denies significant congestion.  + PNDrainage.  Intermittent hand stiffness associated with pain. Otherwise not painful. Last Friday lasted all day - worried this is getting worse. Denies redness or warmth, no swelling.   Past Medical History  Diagnosis Date  . Type 2 diabetes with nephropathy     resolved with weight loss  . Hyperlipidemia   . Hypertension   . Anxiety   . Facial droop 1935    acquired during forceps delivery, some residual R visual loss  . Anemia in chronic kidney disease     IV iron infusion  . Hx of breast cancer 2004  . Arthritis     mild in lower back  . End stage renal disease on dialysis     HD M,W,F [place] AVF now LUE AVF  . GERD (gastroesophageal reflux disease)   . Cardiac murmur     a. thought due to AVF (2D echo 06/2012 without significant valvular disease).   . Bradycardia     a. Noted 06/2012.  Marland Kitchen Hypotension     a. Associated w/ dialysis.  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 [name])  . Family history of anesthesia complication     Brother- confusion    Past Surgical History  Procedure Laterality Date  . Cataract extraction  1981  . Av fistula placement  07/20/10    Right brachiocephalic AVF  . Umbilical hernia repair    . Dexa  2013    [facility]  . US echocardiography  06/2012    LVH, EF 65%, grade 1 diastol dysfunction, mod dliated LAD  . Eye surgery  1966    regular cataract  . Breast lumpectomy Left   . Breast  biopsy Left   . Bascilic vein transposition Left 07/24/2013    Procedure: BASILIC VEIN TRANSPOSITION;  Surgeon: [name], MD;  Location: [facility] OR;  Service: Vascular;  Laterality: Left;    Relevant past medical, surgical, family and social history reviewed and updated as indicated.  Allergies and medications reviewed and updated. Current Outpatient Prescriptions on File Prior to Visit  Medication Sig  . amLODipine (NORVASC) 5 MG tablet Take 5 mg by mouth See admin instructions. Daily except do not take on dialysis days (mon,wed and fri)  . Artificial Tear Ointment (REFRESH P.M. OP) Place 1 application into the right eye daily.  . B Complex-C-Folic Acid (RENA-VITE PO) Take 1 tablet by mouth daily with breakfast.   . Calcium Carbonate Antacid (TUMS ULTRA 1000 PO) Take 2,000 mg by mouth 3 (three) times daily with meals.   Marland Kitchen omeprazole (PRILOSEC) 20 MG capsule Take 20 mg by mouth daily.  . simvastatin (ZOCOR) 20 MG tablet Take 20 mg by mouth at bedtime.    No current facility-administered medications on file prior to visit.    Review of Systems Per HPI unless specifically indicated above    Objective:  BP 146/74  Pulse 80  Temp(Src) 97.9 F (36.6 C) (Oral)  Wt 114 lb 8 oz (51.937 kg)  Physical Exam  Nursing note and vitals reviewed. Constitutional: She appears well-developed and well-nourished. No distress.  HENT:  Nose: Mucosal edema present. No rhinorrhea.  L>R nasal passage narrow, some pale boggy turbinates predominantly on left Deviated septum  Musculoskeletal: She exhibits no edema.  No active synovitis. No erythema or inflammation of MCP or IP joints, not tender to palpation. FROM at entire digits of bilateral hands  Skin: Skin is warm and dry. No rash noted. No erythema.       Assessment & Plan:   Problem List Items Addressed This Visit   Nasal congestion - Primary     Anticipate due to deviated septum on exam as well as possible allergic rhinitis - so will  start flonase to left nare.  Update with effect. If persistent, consider ENT referral for eval of unilateral congestion.    Joint stiffness of hand     Normal exam. Advised monitor sxs for now. Not consistent with arthritis type pain today or trigger finger        Follow up plan: Return if symptoms worsen or fail to improve.

## 2013-11-13 NOTE — Progress Notes (Signed)
Pre visit review using our clinic review tool, if applicable. No additional management support is needed unless otherwise documented below in the visit note. 

## 2013-11-13 NOTE — Assessment & Plan Note (Signed)
Normal exam. Advised monitor sxs for now. Not consistent with arthritis type pain today or trigger finger

## 2013-11-13 NOTE — Assessment & Plan Note (Signed)
Anticipate due to deviated septum on exam as well as possible allergic rhinitis - so will start flonase to left nare.  Update with effect. If persistent, consider ENT referral for eval of unilateral congestion.

## 2013-11-14 DIAGNOSIS — L02818 Cutaneous abscess of other sites: Secondary | ICD-10-CM | POA: Diagnosis not present

## 2013-11-14 DIAGNOSIS — N186 End stage renal disease: Secondary | ICD-10-CM | POA: Diagnosis not present

## 2013-11-14 DIAGNOSIS — D631 Anemia in chronic kidney disease: Secondary | ICD-10-CM | POA: Diagnosis not present

## 2013-11-14 DIAGNOSIS — E1129 Type 2 diabetes mellitus with other diabetic kidney complication: Secondary | ICD-10-CM | POA: Diagnosis not present

## 2013-11-16 DIAGNOSIS — D631 Anemia in chronic kidney disease: Secondary | ICD-10-CM | POA: Diagnosis not present

## 2013-11-16 DIAGNOSIS — N186 End stage renal disease: Secondary | ICD-10-CM | POA: Diagnosis not present

## 2013-11-16 DIAGNOSIS — E1129 Type 2 diabetes mellitus with other diabetic kidney complication: Secondary | ICD-10-CM | POA: Diagnosis not present

## 2013-11-16 DIAGNOSIS — L02818 Cutaneous abscess of other sites: Secondary | ICD-10-CM | POA: Diagnosis not present

## 2013-11-19 DIAGNOSIS — E1129 Type 2 diabetes mellitus with other diabetic kidney complication: Secondary | ICD-10-CM | POA: Diagnosis not present

## 2013-11-19 DIAGNOSIS — D631 Anemia in chronic kidney disease: Secondary | ICD-10-CM | POA: Diagnosis not present

## 2013-11-19 DIAGNOSIS — L02818 Cutaneous abscess of other sites: Secondary | ICD-10-CM | POA: Diagnosis not present

## 2013-11-19 DIAGNOSIS — N186 End stage renal disease: Secondary | ICD-10-CM | POA: Diagnosis not present

## 2013-11-21 DIAGNOSIS — N186 End stage renal disease: Secondary | ICD-10-CM | POA: Diagnosis not present

## 2013-11-21 DIAGNOSIS — E1129 Type 2 diabetes mellitus with other diabetic kidney complication: Secondary | ICD-10-CM | POA: Diagnosis not present

## 2013-11-21 DIAGNOSIS — L03818 Cellulitis of other sites: Secondary | ICD-10-CM | POA: Diagnosis not present

## 2013-11-21 DIAGNOSIS — N039 Chronic nephritic syndrome with unspecified morphologic changes: Secondary | ICD-10-CM | POA: Diagnosis not present

## 2013-11-21 DIAGNOSIS — D631 Anemia in chronic kidney disease: Secondary | ICD-10-CM | POA: Diagnosis not present

## 2013-11-21 DIAGNOSIS — L02818 Cutaneous abscess of other sites: Secondary | ICD-10-CM | POA: Diagnosis not present

## 2013-11-23 DIAGNOSIS — E1129 Type 2 diabetes mellitus with other diabetic kidney complication: Secondary | ICD-10-CM | POA: Diagnosis not present

## 2013-11-23 DIAGNOSIS — N186 End stage renal disease: Secondary | ICD-10-CM | POA: Diagnosis not present

## 2013-11-23 DIAGNOSIS — D631 Anemia in chronic kidney disease: Secondary | ICD-10-CM | POA: Diagnosis not present

## 2013-11-23 DIAGNOSIS — L02818 Cutaneous abscess of other sites: Secondary | ICD-10-CM | POA: Diagnosis not present

## 2013-11-26 DIAGNOSIS — D631 Anemia in chronic kidney disease: Secondary | ICD-10-CM | POA: Diagnosis not present

## 2013-11-26 DIAGNOSIS — N186 End stage renal disease: Secondary | ICD-10-CM | POA: Diagnosis not present

## 2013-11-26 DIAGNOSIS — L02818 Cutaneous abscess of other sites: Secondary | ICD-10-CM | POA: Diagnosis not present

## 2013-11-26 DIAGNOSIS — E1129 Type 2 diabetes mellitus with other diabetic kidney complication: Secondary | ICD-10-CM | POA: Diagnosis not present

## 2013-11-27 DIAGNOSIS — M810 Age-related osteoporosis without current pathological fracture: Secondary | ICD-10-CM

## 2013-11-27 HISTORY — DX: Age-related osteoporosis without current pathological fracture: M81.0

## 2013-11-28 DIAGNOSIS — D509 Iron deficiency anemia, unspecified: Secondary | ICD-10-CM | POA: Diagnosis not present

## 2013-11-28 DIAGNOSIS — N039 Chronic nephritic syndrome with unspecified morphologic changes: Secondary | ICD-10-CM | POA: Diagnosis not present

## 2013-11-28 DIAGNOSIS — N186 End stage renal disease: Secondary | ICD-10-CM | POA: Diagnosis not present

## 2013-11-28 DIAGNOSIS — D631 Anemia in chronic kidney disease: Secondary | ICD-10-CM | POA: Diagnosis not present

## 2013-11-28 DIAGNOSIS — E1129 Type 2 diabetes mellitus with other diabetic kidney complication: Secondary | ICD-10-CM | POA: Diagnosis not present

## 2013-12-04 DIAGNOSIS — N186 End stage renal disease: Secondary | ICD-10-CM | POA: Diagnosis not present

## 2013-12-04 DIAGNOSIS — T82898A Other specified complication of vascular prosthetic devices, implants and grafts, initial encounter: Secondary | ICD-10-CM | POA: Diagnosis not present

## 2013-12-11 ENCOUNTER — Other Ambulatory Visit: Payer: Self-pay | Admitting: Family Medicine

## 2013-12-11 DIAGNOSIS — Z853 Personal history of malignant neoplasm of breast: Secondary | ICD-10-CM | POA: Diagnosis not present

## 2013-12-11 DIAGNOSIS — M899 Disorder of bone, unspecified: Secondary | ICD-10-CM | POA: Diagnosis not present

## 2013-12-11 DIAGNOSIS — M949 Disorder of cartilage, unspecified: Secondary | ICD-10-CM | POA: Diagnosis not present

## 2013-12-11 LAB — HM DEXA SCAN

## 2013-12-12 DIAGNOSIS — E1129 Type 2 diabetes mellitus with other diabetic kidney complication: Secondary | ICD-10-CM | POA: Diagnosis not present

## 2013-12-18 ENCOUNTER — Encounter: Payer: Self-pay | Admitting: Family Medicine

## 2013-12-19 ENCOUNTER — Encounter: Payer: Self-pay | Admitting: Family Medicine

## 2013-12-21 ENCOUNTER — Telehealth: Payer: Self-pay | Admitting: Family Medicine

## 2013-12-21 NOTE — Telephone Encounter (Signed)
Pt returned your call about dexa scan. Please call pt [phone].

## 2013-12-21 NOTE — Telephone Encounter (Signed)
Please refer to result note as there is already a note on results and communication with pt

## 2013-12-25 DIAGNOSIS — T82898A Other specified complication of vascular prosthetic devices, implants and grafts, initial encounter: Secondary | ICD-10-CM | POA: Diagnosis not present

## 2013-12-25 DIAGNOSIS — I871 Compression of vein: Secondary | ICD-10-CM | POA: Diagnosis not present

## 2013-12-25 DIAGNOSIS — N186 End stage renal disease: Secondary | ICD-10-CM | POA: Diagnosis not present

## 2013-12-26 DIAGNOSIS — N186 End stage renal disease: Secondary | ICD-10-CM | POA: Diagnosis not present

## 2013-12-27 ENCOUNTER — Telehealth: Payer: Self-pay | Admitting: Family Medicine

## 2013-12-27 DIAGNOSIS — R0981 Nasal congestion: Secondary | ICD-10-CM

## 2013-12-27 NOTE — Telephone Encounter (Signed)
Pt is still having trouble breathing out of left side of nose. Please advise  [phone]

## 2013-12-28 DIAGNOSIS — E1129 Type 2 diabetes mellitus with other diabetic kidney complication: Secondary | ICD-10-CM | POA: Diagnosis not present

## 2013-12-28 DIAGNOSIS — Z23 Encounter for immunization: Secondary | ICD-10-CM | POA: Diagnosis not present

## 2013-12-28 DIAGNOSIS — D508 Other iron deficiency anemias: Secondary | ICD-10-CM | POA: Diagnosis not present

## 2013-12-28 DIAGNOSIS — N186 End stage renal disease: Secondary | ICD-10-CM | POA: Diagnosis not present

## 2013-12-28 DIAGNOSIS — D631 Anemia in chronic kidney disease: Secondary | ICD-10-CM | POA: Diagnosis not present

## 2013-12-28 DIAGNOSIS — D509 Iron deficiency anemia, unspecified: Secondary | ICD-10-CM | POA: Diagnosis not present

## 2013-12-28 NOTE — Telephone Encounter (Signed)
Noted. Next step we discussed was ENT referral. Placed.

## 2014-01-04 DIAGNOSIS — R0981 Nasal congestion: Secondary | ICD-10-CM | POA: Diagnosis not present

## 2014-01-04 DIAGNOSIS — M950 Acquired deformity of nose: Secondary | ICD-10-CM | POA: Diagnosis not present

## 2014-01-10 ENCOUNTER — Ambulatory Visit (INDEPENDENT_AMBULATORY_CARE_PROVIDER_SITE_OTHER): Payer: Medicare Other | Admitting: Family Medicine

## 2014-01-10 ENCOUNTER — Other Ambulatory Visit: Payer: Self-pay | Admitting: *Deleted

## 2014-01-10 ENCOUNTER — Ambulatory Visit (INDEPENDENT_AMBULATORY_CARE_PROVIDER_SITE_OTHER)
Admission: RE | Admit: 2014-01-10 | Discharge: 2014-01-10 | Disposition: A | Discharge status: Home / Self Care | Payer: Medicare Other | Source: Ambulatory Visit | Attending: Family Medicine | Admitting: Family Medicine

## 2014-01-10 ENCOUNTER — Encounter: Payer: Self-pay | Admitting: Family Medicine

## 2014-01-10 VITALS — BP 128/68 | HR 56 | Temp 97.8°F | Wt 118.5 lb

## 2014-01-10 DIAGNOSIS — M79672 Pain in left foot: Secondary | ICD-10-CM

## 2014-01-10 MED ORDER — AMLODIPINE BESYLATE 5 MG PO TABS: 5.0000 mg | ORAL_TABLET | Freq: Every day | ORAL | Status: DC

## 2014-01-10 NOTE — Assessment & Plan Note (Signed)
Anticipate foot strain - treat with continued ice, elevation, rest.  Xray to r/o fracture/eval arthritic burden.  Update if not improving as expected.  Pt agrees with plan.

## 2014-01-10 NOTE — Progress Notes (Signed)
Pre visit review using our clinic review tool, if applicable. No additional management support is needed unless otherwise documented below in the visit note. 

## 2014-01-10 NOTE — Patient Instructions (Signed)
[name] today  But I think this is more of a foot strain - may use tylenol 500-1000mg  up to twice a day as needed and continue ice and elevating leg. Let us know if not improving with this.

## 2014-01-10 NOTE — Progress Notes (Signed)
BP 128/68  Pulse 56  Temp(Src) 97.8 F (36.6 C) (Oral)  Wt 118 lb 8 oz (53.751 kg)   CC: foot/ankle pain  Subjective:    Patient ID: [name], female    DOB: 1931/12/26, 80 y.o.   MRN: [id number]  HPI: [name] is a 80 y.o. female presenting on 01/10/2014 for Pain   1 wk ago when she woke up noticed pain L ankle and foot - painful to walk. Describes pain at sole midfoot and around lateral ankle. Worse pain with prolonged sitting. Never swelling, erythematous, warm. More pain with weight bearing on left foot. Denies inciting trauma/falls  Self treated with ice which helped. Progressively improving but peristent mild pain.  Relevant past medical, surgical, family and social history reviewed and updated as indicated.  Allergies and medications reviewed and updated. Current Outpatient Prescriptions on File Prior to Visit  Medication Sig  . amLODipine (NORVASC) 5 MG tablet Take 5 mg by mouth at bedtime. Daily except do not take on dialysis days (mon,wed and fri)  . Artificial Tear Ointment (REFRESH P.M. OP) Place 1 application into the right eye daily.  . B Complex-C-Folic Acid (RENA-VITE PO) Take 1 tablet by mouth daily with breakfast.   . Calcium Carbonate Antacid (TUMS ULTRA 1000 PO) Take 2,000 mg by mouth 3 (three) times daily with meals.   Marland Kitchen omeprazole (PRILOSEC) 20 MG capsule Take 20 mg by mouth daily.  . simvastatin (ZOCOR) 20 MG tablet TAKE ONE TABLET BY MOUTH AT BEDTIME  . fluticasone (FLONASE) 50 MCG/ACT nasal spray Place 2 sprays into both nostrils daily. Left side   No current facility-administered medications on file prior to visit.   Past Medical History  Diagnosis Date  . Type 2 diabetes with nephropathy     resolved with weight loss  . Hyperlipidemia   . Hypertension   . Anxiety   . Facial droop 1935    acquired during forceps delivery, some residual R visual loss  . Anemia in chronic kidney disease     IV iron infusion  . Hx of breast cancer 2004    . Arthritis     mild in lower back  . End stage renal disease on dialysis     HD M,W,F [place] AVF now LUE AVF  . GERD (gastroesophageal reflux disease)   . Cardiac murmur     a. thought due to AVF (2D echo 06/2012 without significant valvular disease).   . Bradycardia     a. Noted 06/2012.  Marland Kitchen Hypotension     a. Associated w/ dialysis.  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 [name])  . Osteoporosis 11/2013    DEXA -3.4 radius   Review of Systems Per HPI unless specifically indicated above    Objective:    BP 128/68  Pulse 56  Temp(Src) 97.8 F (36.6 C) (Oral)  Wt 118 lb 8 oz (53.751 kg)  Physical Exam  Nursing note and vitals reviewed. Constitutional: She appears well-developed and well-nourished. No distress.  Musculoskeletal: She exhibits no edema.  R foot WNL L foot - sensation intact, 2+ DP, skin intact, tender to palpation mainly at cuboid bone, no pain with calcaneal squeeze, no ankle ligament laxity, no edema, no pain to palpation along tendons.       Assessment & Plan:   Problem List Items Addressed This Visit   Left foot pain - Primary     Anticipate foot strain -  treat with continued ice, elevation, rest.  Xray to r/o fracture/eval arthritic burden.  Update if not improving as expected.  Pt agrees with plan.     Relevant Orders      DG Foot Complete Left       Follow up plan: Return if symptoms worsen or fail to improve.

## 2014-01-14 ENCOUNTER — Telehealth: Payer: Self-pay | Admitting: Family Medicine

## 2014-01-14 NOTE — Telephone Encounter (Signed)
Message left for patient to return my call.  

## 2014-01-14 NOTE — Telephone Encounter (Signed)
Caller: [name]/Patient; Phone: [phone]; Reason for Call: Pt is calling and requesting to talk to [facility] in the office.  She states that she talked to Maudie Mercury last week and was instructed to call back today.  No notes noted in EPIC.  Call was disconnected.  Attempted to reach caller back at number given x 2 with no answer.  Left message to call office back. CD/CAN

## 2014-01-15 ENCOUNTER — Encounter: Payer: Self-pay | Admitting: Family Medicine

## 2014-01-18 ENCOUNTER — Telehealth: Payer: Self-pay

## 2014-01-18 NOTE — Telephone Encounter (Signed)
Spoke with patient. She said she had diarrhea from 1AM-5AM this morning but since. It also happened yesterday morning around the same time. She said she doesn't have much appetite but is able to keep food and fluid down. She said she doesn't have any pain or feel dehydrated at this time. I advised that if diarrhea worsened or if she started to feel dehydrated over the weekend- to go to [facility]. She verbalized understanding. I also offered to schedule an appt with Dr. Darnell Level next week for eval, and she declined stating she would just see how she did over the weekend and call if she needed an appt next week. I encouraged small sips of fluid more than food-but a bland diet if she ate. She verbalized understanding.

## 2014-01-18 NOTE — Telephone Encounter (Signed)
Noted, thanks.  Routed to Dr. Darnell Level as a FYI.

## 2014-01-18 NOTE — Telephone Encounter (Signed)
Patient Information: Caller Name: Yarlin Phone: [phone] Patient: [name], [name] Gender: Female DOB: 20-Jul-1931 Age: 80 Years PCP: [name] [facility]) Office Follow Up: Does the office need to follow up with this patient?: Yes Instructions For The Office: Needs visit RN Note: Patient is calling regarding diarrhea. c/o watery stools. Denies pain. Currently getting dialysis now. Symptoms Reason For Call & Symptoms: diarrhea Reviewed Health History In EMR: Yes Reviewed Medications In EMR: Yes Reviewed Allergies In EMR: Yes Reviewed Surgeries / Procedures: Yes Date of Onset of Symptoms: 01/11/2014 Treatments Tried: Imodium Treatments Tried Worked: No Guideline(s) Used: Diarrhea Disposition Per Guideline: See Today in Office Reason For Disposition Reached: Weak immune system (e.g., HIV positive, cancer chemo, splenectomy, organ transplant, chronic steroids) Advice Given: N/A Patient Will Follow Care Advice: YES

## 2014-01-18 NOTE — Telephone Encounter (Signed)
I would call the HD center and/or patient. If they think she needs to be seen, then she should go to UC.  This may be AGE and I would think the HD center would have been able to eval her fluid status, etc.  Thanks.

## 2014-01-26 DIAGNOSIS — N186 End stage renal disease: Secondary | ICD-10-CM | POA: Diagnosis not present

## 2014-01-26 DIAGNOSIS — Z992 Dependence on renal dialysis: Secondary | ICD-10-CM | POA: Diagnosis not present

## 2014-01-28 DIAGNOSIS — D631 Anemia in chronic kidney disease: Secondary | ICD-10-CM | POA: Diagnosis not present

## 2014-01-28 DIAGNOSIS — D508 Other iron deficiency anemias: Secondary | ICD-10-CM | POA: Diagnosis not present

## 2014-01-28 DIAGNOSIS — E1129 Type 2 diabetes mellitus with other diabetic kidney complication: Secondary | ICD-10-CM | POA: Diagnosis not present

## 2014-01-28 DIAGNOSIS — N186 End stage renal disease: Secondary | ICD-10-CM | POA: Diagnosis not present

## 2014-01-31 DIAGNOSIS — Z992 Dependence on renal dialysis: Secondary | ICD-10-CM | POA: Diagnosis not present

## 2014-01-31 DIAGNOSIS — Z451 Encounter for adjustment and management of infusion pump: Secondary | ICD-10-CM | POA: Diagnosis not present

## 2014-01-31 DIAGNOSIS — N186 End stage renal disease: Secondary | ICD-10-CM | POA: Diagnosis not present

## 2014-02-25 DIAGNOSIS — Z992 Dependence on renal dialysis: Secondary | ICD-10-CM | POA: Diagnosis not present

## 2014-02-25 DIAGNOSIS — N186 End stage renal disease: Secondary | ICD-10-CM | POA: Diagnosis not present

## 2014-02-27 DIAGNOSIS — D631 Anemia in chronic kidney disease: Secondary | ICD-10-CM | POA: Diagnosis not present

## 2014-02-27 DIAGNOSIS — N186 End stage renal disease: Secondary | ICD-10-CM | POA: Diagnosis not present

## 2014-02-27 DIAGNOSIS — E1129 Type 2 diabetes mellitus with other diabetic kidney complication: Secondary | ICD-10-CM | POA: Diagnosis not present

## 2014-03-05 ENCOUNTER — Other Ambulatory Visit: Payer: Self-pay | Admitting: Family Medicine

## 2014-03-07 ENCOUNTER — Encounter (HOSPITAL_COMMUNITY): Payer: Self-pay | Admitting: Vascular Surgery

## 2014-03-12 ENCOUNTER — Ambulatory Visit (INDEPENDENT_AMBULATORY_CARE_PROVIDER_SITE_OTHER)
Admission: RE | Admit: 2014-03-12 | Discharge: 2014-03-12 | Disposition: A | Discharge status: Home / Self Care | Payer: Medicare Other | Source: Ambulatory Visit | Attending: Family Medicine | Admitting: Family Medicine

## 2014-03-12 ENCOUNTER — Ambulatory Visit (INDEPENDENT_AMBULATORY_CARE_PROVIDER_SITE_OTHER): Payer: Medicare Other | Admitting: Family Medicine

## 2014-03-12 ENCOUNTER — Encounter: Payer: Self-pay | Admitting: Family Medicine

## 2014-03-12 VITALS — BP 110/60 | HR 76 | Temp 98.0°F | Wt 112.0 lb

## 2014-03-12 DIAGNOSIS — Y841 Kidney dialysis as the cause of abnormal reaction of the patient, or of later complication, without mention of misadventure at the time of the procedure: Secondary | ICD-10-CM | POA: Diagnosis not present

## 2014-03-12 DIAGNOSIS — N186 End stage renal disease: Secondary | ICD-10-CM | POA: Diagnosis not present

## 2014-03-12 DIAGNOSIS — S4991XA Unspecified injury of right shoulder and upper arm, initial encounter: Secondary | ICD-10-CM

## 2014-03-12 DIAGNOSIS — M25511 Pain in right shoulder: Secondary | ICD-10-CM | POA: Diagnosis not present

## 2014-03-12 NOTE — Patient Instructions (Addendum)
Xray of shoulder today I think you have rotator cuff injury - treat with tylenol 500mg  two to three times daily for pain, start exercises provided today with resistance band.  If no better, let me know for referral to physical therapy.

## 2014-03-12 NOTE — Progress Notes (Signed)
Pre visit review using our clinic review tool, if applicable. No additional management support is needed unless otherwise documented below in the visit note. 

## 2014-03-12 NOTE — Progress Notes (Signed)
   BP 110/60 mmHg  Pulse 76  Temp(Src) 98 F (36.7 C) (Tympanic)  Wt 112 lb (50.803 kg)   CC: R shoulder pain after fall  Subjective:    Patient ID: [name], female    DOB: 03/06/1932, 80 y.o.   MRN: [id number]  HPI: TAQWA DEEM is a 80 y.o. female presenting on 03/12/2014 for right shoulder pain, fell x1 week ago   1 wk ago (03/02/2014) fell down at home while walking backwards and hit R shoulder. Initially no pain for 4 days then 1 wk ago started noticing worsening posterior shoulder pain. Worse with turning steering wheel of car, worse getting out of bed. No neck pain, no fever, no radiation down arm, weakness or numbness of arms.  So far hasn't tried meds other than aleve for this which helped.   ESRD on HD MWF.  Relevant past medical, surgical, family and social history reviewed and updated as indicated. Interim medical history since our last visit reviewed. Allergies and medications reviewed and updated.  Current Outpatient Prescriptions on File Prior to Visit  Medication Sig  . amLODipine (NORVASC) 5 MG tablet Take 1 tablet (5 mg total) by mouth at bedtime. Daily except do not take on dialysis days (mon,wed and fri)  . Artificial Tear Ointment (REFRESH P.M. OP) Place 1 application into the right eye daily.  . B Complex-C-Folic Acid (RENA-VITE PO) Take 1 tablet by mouth daily with breakfast.   . Calcium Carbonate Antacid (TUMS ULTRA 1000 PO) Take 2,000 mg by mouth 3 (three) times daily with meals.   Marland Kitchen omeprazole (PRILOSEC) 20 MG capsule TAKE ONE CAPSULE BY MOUTH EVERY DAY  . simvastatin (ZOCOR) 20 MG tablet TAKE ONE TABLET BY MOUTH AT BEDTIME   No current facility-administered medications on file prior to visit.    Review of Systems Per HPI unless specifically indicated above     Objective:    BP 110/60 mmHg  Pulse 76  Temp(Src) 98 F (36.7 C) (Tympanic)  Wt 112 lb (50.803 kg)  Wt Readings from Last 3 Encounters:  03/12/14 112 lb (50.803 kg)  01/10/14 118  lb 8 oz (53.751 kg)  11/13/13 114 lb 8 oz (51.937 kg)    Physical Exam  Constitutional: She appears well-developed and well-nourished. No distress.  Musculoskeletal: She exhibits no edema.  L shoulder WNL R Shoulder exam: No deformity of shoulders on inspection. Pain with palpation of R supraspinatus mm FROM in abduction and forward flexion but tender to palpation. ++ pain/weakness with testing SITS in ext/int rotation. ++ pain with empty can sign. + impingement. No pain with crossover test. No pain with rotation of humeral head in [place] joint.   Nursing note and vitals reviewed.      Assessment & Plan:   Problem List Items Addressed This Visit    Right shoulder injury - Primary    Anticipate rotator cuff tendonitis after fall given exam findings. Treat with tylenol 500mg  tid prn. Provided with exercises for RTC injury from [facility] pt advisor.  Update if not improving as expected for referral to PT. Pt agrees with plan.  Check XRay today to r/o fracture    Relevant Orders      DG Shoulder Right       Follow up plan: Return if symptoms worsen or fail to improve.

## 2014-03-12 NOTE — Assessment & Plan Note (Addendum)
Anticipate rotator cuff tendonitis after fall given exam findings. Treat with tylenol 500mg  tid prn. Provided with exercises for RTC injury from [facility] pt advisor.  Update if not improving as expected for referral to PT. Pt agrees with plan.  Check XRay today to r/o fracture

## 2014-03-13 DIAGNOSIS — E1129 Type 2 diabetes mellitus with other diabetic kidney complication: Secondary | ICD-10-CM | POA: Diagnosis not present

## 2014-03-27 ENCOUNTER — Telehealth: Payer: Self-pay

## 2014-03-27 DIAGNOSIS — S4991XA Unspecified injury of right shoulder and upper arm, initial encounter: Secondary | ICD-10-CM

## 2014-03-27 NOTE — Telephone Encounter (Signed)
PLEASE NOTE: All timestamps contained within this report are represented as [place] Standard Time. CONFIDENTIALTY NOTICE: This fax transmission is intended only for the addressee. It contains information that is legally privileged, confidential or otherwise protected from use or disclosure. If you are not the intended recipient, you are strictly prohibited from reviewing, disclosing, copying using or disseminating any of this information or taking any action in reliance on or regarding this information. If you have received this fax in error, please notify us immediately by telephone so that we can arrange for its return to [place]. Phone: [phone], Toll-Free: [phone], Fax: [phone] Page: 1 of 2 Call Id: [id number] Thayer Patient Name: [name] Gender: Female DOB: 1931/09/02 Age: 80 Y 2 M 23 D Return Phone Number: [phone] (Primary) Address: [address] [place] [address] Client [place] [facility] - Client Client Site [place] Physician [name] Contact Type Call Call Type Triage / Clinical Relationship To Patient Self Return Phone Number [phone] (Primary) Chief Complaint Shoulder Injury Initial Comment Caller States she fell and hurt her rotary cuff. dr gave her exercises to do that she does regularly. she is still having pain at that injured site, and wants to know how long she should keep doing the exercises or should she go back to the dr. PreDisposition Call Doctor Nurse Assessment Nurse: Alvis Lemmings, RN, Marcie Bal Date/Time [name] Time): 03/26/2014 4:49:54 PM Confirm and document reason for call. If symptomatic, describe symptoms. ---Caller States she fell and hurt her rotary cuff. dr gave her exercises to do that she does regularly. she is still having pain at that injured site, and  wants to know how long she should keep doing the exercises or should she go back to the dr. Has the patient traveled out of the country within the last 30 days? ---Not Applicable Does the patient require triage? ---Yes Related visit to physician within the last 2 weeks? ---No Does the PT have any chronic conditions? (i.e. diabetes, asthma, etc.) ---Unknown Guidelines Guideline Title Affirmed Question Affirmed Notes Nurse Date/Time [name] Time) Shoulder Injury [1] High-risk adult (e.g., age > 12, osteoporosis, chronic steroid use) AND [2] still hurts Etter Sjogren 03/26/2014 4:50:34 PM Disp. Time [name] Time) Disposition Final User 03/26/2014 4:34:54 PM Attempt made - message left Etter Sjogren 03/26/2014 4:52:56 PM See Physician within 24 Hours Yes Alvis Lemmings, RN, Lenon Oms Understands: Yes PLEASE NOTE: All timestamps contained within this report are represented as [place] Standard Time. CONFIDENTIALTY NOTICE: This fax transmission is intended only for the addressee. It contains information that is legally privileged, confidential or otherwise protected from use or disclosure. If you are not the intended recipient, you are strictly prohibited from reviewing, disclosing, copying using or disseminating any of this information or taking any action in reliance on or regarding this information. If you have received this fax in error, please notify us immediately by telephone so that we can arrange for its return to [place]. Phone: [phone], Toll-Free: [phone], Fax: [phone] Page: 2 of 2 Call Id: [id number] Disagree/Comply: Comply Care Advice Given Per Guideline SEE PHYSICIAN WITHIN 24 HOURS: LOCAL COLD: For bruises or swelling, apply a cold pack or an ice bag (wrapped in a moist towel) to the area for 20 minutes per hour. Repeat for 4 consecutive hours. (Reason: reduce the bleeding and pain) CALL BACK IF: * You become worse. CARE ADVICE  given per Shoulder Injury  (Adult) guideline. Caller advised to use warm compresses to loosen up muscles and then do exercises. After exercises use a cold compress for 20 min. Call Dr to be reassessed and possibly go to PT for assessment. After Care Instructions Given Call Event Type User Date / Time Description Referrals REFERRED TO PCP OFFICE

## 2014-03-27 NOTE — Telephone Encounter (Signed)
Spoke with patient. She said her shoulder isn't any worse, but isn't any better after the regular home exercises x2 weeks. Last office note said PT referral if no improvement. I advised you would put in referral and to expect call from [facility]. She verbalized understanding.

## 2014-03-27 NOTE — Telephone Encounter (Signed)
Will place PT referral. If no better, will recommend ortho referral.

## 2014-03-28 DIAGNOSIS — Z992 Dependence on renal dialysis: Secondary | ICD-10-CM | POA: Diagnosis not present

## 2014-03-28 DIAGNOSIS — N186 End stage renal disease: Secondary | ICD-10-CM | POA: Diagnosis not present

## 2014-03-29 DIAGNOSIS — M25511 Pain in right shoulder: Secondary | ICD-10-CM | POA: Diagnosis not present

## 2014-03-30 DIAGNOSIS — E1129 Type 2 diabetes mellitus with other diabetic kidney complication: Secondary | ICD-10-CM | POA: Diagnosis not present

## 2014-03-30 DIAGNOSIS — D509 Iron deficiency anemia, unspecified: Secondary | ICD-10-CM | POA: Diagnosis not present

## 2014-03-30 DIAGNOSIS — D631 Anemia in chronic kidney disease: Secondary | ICD-10-CM | POA: Diagnosis not present

## 2014-03-30 DIAGNOSIS — N186 End stage renal disease: Secondary | ICD-10-CM | POA: Diagnosis not present

## 2014-04-01 DIAGNOSIS — H2702 Aphakia, left eye: Secondary | ICD-10-CM | POA: Diagnosis not present

## 2014-04-03 ENCOUNTER — Encounter: Payer: Self-pay | Admitting: Family Medicine

## 2014-04-03 DIAGNOSIS — D631 Anemia in chronic kidney disease: Secondary | ICD-10-CM | POA: Diagnosis not present

## 2014-04-04 ENCOUNTER — Encounter: Payer: Self-pay | Admitting: Family Medicine

## 2014-04-04 ENCOUNTER — Ambulatory Visit: Payer: Self-pay | Admitting: Vascular Surgery

## 2014-04-04 DIAGNOSIS — Z992 Dependence on renal dialysis: Secondary | ICD-10-CM | POA: Diagnosis not present

## 2014-04-04 DIAGNOSIS — I10 Essential (primary) hypertension: Secondary | ICD-10-CM | POA: Diagnosis not present

## 2014-04-04 DIAGNOSIS — Z853 Personal history of malignant neoplasm of breast: Secondary | ICD-10-CM | POA: Diagnosis not present

## 2014-04-04 DIAGNOSIS — T82858A Stenosis of vascular prosthetic devices, implants and grafts, initial encounter: Secondary | ICD-10-CM | POA: Diagnosis not present

## 2014-04-04 DIAGNOSIS — E785 Hyperlipidemia, unspecified: Secondary | ICD-10-CM | POA: Diagnosis not present

## 2014-04-04 DIAGNOSIS — T888XXD Other specified complications of surgical and medical care, not elsewhere classified, subsequent encounter: Secondary | ICD-10-CM | POA: Diagnosis not present

## 2014-04-04 DIAGNOSIS — T82510S Breakdown (mechanical) of surgically created arteriovenous fistula, sequela: Secondary | ICD-10-CM | POA: Diagnosis not present

## 2014-04-04 DIAGNOSIS — N186 End stage renal disease: Secondary | ICD-10-CM | POA: Diagnosis not present

## 2014-04-08 ENCOUNTER — Inpatient Hospital Stay (HOSPITAL_COMMUNITY)
Admission: EM | Admit: 2014-04-08 | Discharge: 2014-04-10 | DRG: 377 | Disposition: A | Discharge status: Home / Self Care | Payer: Medicare Other | Attending: Internal Medicine | Admitting: Internal Medicine

## 2014-04-08 ENCOUNTER — Encounter (HOSPITAL_COMMUNITY): Payer: Self-pay | Admitting: Family Medicine

## 2014-04-08 DIAGNOSIS — I120 Hypertensive chronic kidney disease with stage 5 chronic kidney disease or end stage renal disease: Secondary | ICD-10-CM | POA: Diagnosis present

## 2014-04-08 DIAGNOSIS — K922 Gastrointestinal hemorrhage, unspecified: Secondary | ICD-10-CM | POA: Diagnosis not present

## 2014-04-08 DIAGNOSIS — I10 Essential (primary) hypertension: Secondary | ICD-10-CM | POA: Diagnosis not present

## 2014-04-08 DIAGNOSIS — D649 Anemia, unspecified: Secondary | ICD-10-CM | POA: Diagnosis not present

## 2014-04-08 DIAGNOSIS — Z9849 Cataract extraction status, unspecified eye: Secondary | ICD-10-CM | POA: Diagnosis not present

## 2014-04-08 DIAGNOSIS — D631 Anemia in chronic kidney disease: Secondary | ICD-10-CM | POA: Diagnosis present

## 2014-04-08 DIAGNOSIS — Z992 Dependence on renal dialysis: Secondary | ICD-10-CM

## 2014-04-08 DIAGNOSIS — E119 Type 2 diabetes mellitus without complications: Secondary | ICD-10-CM | POA: Diagnosis not present

## 2014-04-08 DIAGNOSIS — K31819 Angiodysplasia of stomach and duodenum without bleeding: Secondary | ICD-10-CM | POA: Diagnosis not present

## 2014-04-08 DIAGNOSIS — E1121 Type 2 diabetes mellitus with diabetic nephropathy: Secondary | ICD-10-CM | POA: Diagnosis present

## 2014-04-08 DIAGNOSIS — R0602 Shortness of breath: Secondary | ICD-10-CM | POA: Diagnosis not present

## 2014-04-08 DIAGNOSIS — N189 Chronic kidney disease, unspecified: Secondary | ICD-10-CM

## 2014-04-08 DIAGNOSIS — K449 Diaphragmatic hernia without obstruction or gangrene: Secondary | ICD-10-CM | POA: Diagnosis present

## 2014-04-08 DIAGNOSIS — M1990 Unspecified osteoarthritis, unspecified site: Secondary | ICD-10-CM | POA: Diagnosis present

## 2014-04-08 DIAGNOSIS — K219 Gastro-esophageal reflux disease without esophagitis: Secondary | ICD-10-CM | POA: Diagnosis present

## 2014-04-08 DIAGNOSIS — Z794 Long term (current) use of insulin: Secondary | ICD-10-CM | POA: Diagnosis not present

## 2014-04-08 DIAGNOSIS — Q2733 Arteriovenous malformation of digestive system vessel: Secondary | ICD-10-CM | POA: Diagnosis not present

## 2014-04-08 DIAGNOSIS — Z79899 Other long term (current) drug therapy: Secondary | ICD-10-CM | POA: Diagnosis not present

## 2014-04-08 DIAGNOSIS — H5461 Unqualified visual loss, right eye, normal vision left eye: Secondary | ICD-10-CM | POA: Diagnosis present

## 2014-04-08 DIAGNOSIS — Z87891 Personal history of nicotine dependence: Secondary | ICD-10-CM

## 2014-04-08 DIAGNOSIS — M810 Age-related osteoporosis without current pathological fracture: Secondary | ICD-10-CM | POA: Diagnosis present

## 2014-04-08 DIAGNOSIS — E1129 Type 2 diabetes mellitus with other diabetic kidney complication: Secondary | ICD-10-CM | POA: Diagnosis not present

## 2014-04-08 DIAGNOSIS — N2581 Secondary hyperparathyroidism of renal origin: Secondary | ICD-10-CM | POA: Diagnosis present

## 2014-04-08 DIAGNOSIS — D500 Iron deficiency anemia secondary to blood loss (chronic): Secondary | ICD-10-CM | POA: Diagnosis present

## 2014-04-08 DIAGNOSIS — K31811 Angiodysplasia of stomach and duodenum with bleeding: Secondary | ICD-10-CM | POA: Diagnosis not present

## 2014-04-08 DIAGNOSIS — Z882 Allergy status to sulfonamides status: Secondary | ICD-10-CM | POA: Diagnosis not present

## 2014-04-08 DIAGNOSIS — I129 Hypertensive chronic kidney disease with stage 1 through stage 4 chronic kidney disease, or unspecified chronic kidney disease: Secondary | ICD-10-CM | POA: Diagnosis not present

## 2014-04-08 DIAGNOSIS — N186 End stage renal disease: Secondary | ICD-10-CM | POA: Diagnosis present

## 2014-04-08 DIAGNOSIS — D62 Acute posthemorrhagic anemia: Secondary | ICD-10-CM | POA: Diagnosis present

## 2014-04-08 LAB — COMPREHENSIVE METABOLIC PANEL
ALK PHOS: 130 U/L — AB (ref 39–117)
ALT: 12 U/L (ref 0–35)
AST: 42 U/L — AB (ref 0–37)
Albumin: 3.6 g/dL (ref 3.5–5.2)
Anion gap: 14 (ref 5–15)
BILIRUBIN TOTAL: 0.6 mg/dL (ref 0.3–1.2)
BUN: 14 mg/dL (ref 6–23)
CALCIUM: 8.1 mg/dL — AB (ref 8.4–10.5)
CHLORIDE: 96 meq/L (ref 96–112)
CO2: 28 mmol/L (ref 19–32)
Creatinine, Ser: 3.14 mg/dL — ABNORMAL HIGH (ref 0.50–1.10)
GFR calc Af Amer: 15 mL/min — ABNORMAL LOW (ref >90)
GFR calc non Af Amer: 13 mL/min — ABNORMAL LOW (ref >90)
GLUCOSE: 104 mg/dL — AB (ref 70–99)
Potassium: 3 mmol/L — ABNORMAL LOW (ref 3.5–5.1)
SODIUM: 138 mmol/L (ref 135–145)
Total Protein: 7.1 g/dL (ref 6.0–8.3)

## 2014-04-08 LAB — CBC
HCT: 24.9 % — ABNORMAL LOW (ref 36.0–46.0)
HCT: 23.7 % — ABNORMAL LOW (ref 36.0–46.0)
HEMOGLOBIN: 7.7 g/dL — AB (ref 12.0–15.0)
Hemoglobin: 7.4 g/dL — ABNORMAL LOW (ref 12.0–15.0)
MCH: 29.3 pg (ref 26.0–34.0)
MCH: 29.8 pg (ref 26.0–34.0)
MCHC: 30.9 g/dL (ref 30.0–36.0)
MCHC: 31.2 g/dL (ref 30.0–36.0)
MCV: 94.7 fL (ref 78.0–100.0)
MCV: 95.6 fL (ref 78.0–100.0)
PLATELETS: 285 10*3/uL (ref 150–400)
Platelets: 323 10*3/uL (ref 150–400)
RBC: 2.63 MIL/uL — AB (ref 3.87–5.11)
RBC: 2.48 MIL/uL — ABNORMAL LOW (ref 3.87–5.11)
RDW: 19.2 % — AB (ref 11.5–15.5)
RDW: 19.2 % — ABNORMAL HIGH (ref 11.5–15.5)
WBC: 6.4 10*3/uL (ref 4.0–10.5)
WBC: 6.6 10*3/uL (ref 4.0–10.5)

## 2014-04-08 LAB — TROPONIN I
TROPONIN I: 0.06 ng/mL — AB (ref <0.031)
Troponin I: 0.06 ng/mL — ABNORMAL HIGH (ref <0.031)

## 2014-04-08 LAB — TSH: TSH: 3.097 u[IU]/mL (ref 0.350–4.500)

## 2014-04-08 LAB — ABO/RH: ABO/RH(D): O POS

## 2014-04-08 LAB — GLUCOSE, CAPILLARY: Glucose-Capillary: 90 mg/dL (ref 70–99)

## 2014-04-08 MED ORDER — ACETAMINOPHEN 325 MG PO TABS: 650.0000 mg | ORAL_TABLET | Freq: Four times a day (QID) | ORAL | Status: DC | PRN

## 2014-04-08 MED ORDER — SODIUM CHLORIDE 0.9 % IJ SOLN: 3.0000 mL | Freq: Two times a day (BID) | INTRAMUSCULAR | Status: DC

## 2014-04-08 MED ORDER — SODIUM CHLORIDE 0.9 % IV SOLN: 250.0000 mL | INTRAVENOUS | Status: DC | PRN

## 2014-04-08 MED ORDER — PANTOPRAZOLE SODIUM 40 MG PO TBEC
40.0000 mg | DELAYED_RELEASE_TABLET | Freq: Two times a day (BID) | ORAL | Status: DC
  Administered 2014-04-09 – 2014-04-10 (×3): 40 mg via ORAL
  Filled 2014-04-08 (×4): qty 1

## 2014-04-08 MED ORDER — SIMVASTATIN 20 MG PO TABS
20.0000 mg | ORAL_TABLET | Freq: Every day | ORAL | Status: DC
  Administered 2014-04-08 – 2014-04-09 (×2): 20 mg via ORAL
  Filled 2014-04-08 (×4): qty 1

## 2014-04-08 MED ORDER — AMLODIPINE BESYLATE 5 MG PO TABS
5.0000 mg | ORAL_TABLET | Freq: Every day | ORAL | Status: DC
  Administered 2014-04-08: 5 mg via ORAL
  Filled 2014-04-08: qty 1

## 2014-04-08 MED ORDER — ACETAMINOPHEN 650 MG RE SUPP: 650.0000 mg | Freq: Four times a day (QID) | RECTAL | Status: DC | PRN

## 2014-04-08 MED ORDER — SODIUM CHLORIDE 0.9 % IV SOLN
INTRAVENOUS | Status: DC
  Administered 2014-04-08: 23:00:00 via INTRAVENOUS
  Administered 2014-04-09: 500 mL via INTRAVENOUS

## 2014-04-08 MED ORDER — ONDANSETRON HCL 4 MG PO TABS: 4.0000 mg | ORAL_TABLET | Freq: Four times a day (QID) | ORAL | Status: DC | PRN

## 2014-04-08 MED ORDER — SODIUM CHLORIDE 0.9 % IJ SOLN
3.0000 mL | Freq: Two times a day (BID) | INTRAMUSCULAR | Status: DC
  Administered 2014-04-08 – 2014-04-10 (×4): 3 mL via INTRAVENOUS

## 2014-04-08 MED ORDER — SODIUM CHLORIDE 0.9 % IJ SOLN: 3.0000 mL | INTRAMUSCULAR | Status: DC | PRN

## 2014-04-08 MED ORDER — AMLODIPINE BESYLATE 5 MG PO TABS
5.0000 mg | ORAL_TABLET | ORAL | Status: DC
  Administered 2014-04-09: 5 mg via ORAL
  Filled 2014-04-08 (×2): qty 1

## 2014-04-08 MED ORDER — ONDANSETRON HCL 4 MG/2ML IJ SOLN: 4.0000 mg | Freq: Four times a day (QID) | INTRAMUSCULAR | Status: DC | PRN

## 2014-04-08 MED ORDER — LEVALBUTEROL HCL 0.63 MG/3ML IN NEBU
0.6300 mg | INHALATION_SOLUTION | Freq: Four times a day (QID) | RESPIRATORY_TRACT | Status: DC | PRN
  Filled 2014-04-08: qty 3

## 2014-04-08 MED ORDER — DOCUSATE SODIUM 100 MG PO CAPS
100.0000 mg | ORAL_CAPSULE | Freq: Two times a day (BID) | ORAL | Status: DC
  Administered 2014-04-08 – 2014-04-10 (×4): 100 mg via ORAL
  Filled 2014-04-08 (×7): qty 1

## 2014-04-08 NOTE — ED Provider Notes (Signed)
CSN: [id number]     Arrival date & time 04/08/14  1146 History   First MD Initiated Contact with Patient 04/08/14 1220     Chief Complaint  Patient presents with  . low hgb       HPI Patient presents to emergency room with increasing fatigue.  Was sent over this morning after dialysis because they noted that her hemoglobin was dropping.  She is not know how long it's been going down.  Patient denies any melena or hematochezia.  Denies hematemesis.  Has no known blood loss.  Patient is chronic dialysis for the last few years. Past Medical History  Diagnosis Date  . Type 2 diabetes with nephropathy     resolved with weight loss  . Hyperlipidemia   . Hypertension   . Anxiety   . Facial droop 1935    acquired during forceps delivery, some residual R visual loss  . Anemia in chronic kidney disease     IV iron infusion  . Hx of breast cancer 2004  . Arthritis     mild in lower back  . End stage renal disease on dialysis     HD M,W,F [place] AVF now LUE AVF  . GERD (gastroesophageal reflux disease)   . Cardiac murmur     a. thought due to AVF (2D echo 06/2012 without significant valvular disease).   . Bradycardia     a. Noted 06/2012.  Marland Kitchen Hypotension     a. Associated w/ dialysis.  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 [name])  . Osteoporosis 11/2013    DEXA -3.4 radius   Past Surgical History  Procedure Laterality Date  . Cataract extraction  1981  . Av fistula placement  07/20/10    Right brachiocephalic AVF  . Umbilical hernia repair    . Dexa  2013    [facility]  . US echocardiography  06/2012    LVH, EF 65%, grade 1 diastol dysfunction, mod dliated LAD  . Eye surgery  1966    regular cataract  . Breast lumpectomy Left   . Breast biopsy Left   . Bascilic vein transposition Left 07/24/2013    Procedure: BASILIC VEIN TRANSPOSITION;  Surgeon: [name], MD;  Location: [place];  Service: Vascular;  Laterality: Left;  . Shuntogram  N/A 04/16/2011    Procedure: Earney Mallet;  Surgeon: [name], MD;  Location: [facility] CATH LAB;  Service: Cardiovascular;  Laterality: N/A;  . Shuntogram Right 07/05/2013    Procedure: FISTULOGRAM;  Surgeon: [name], MD;  Location: [facility] CATH LAB;  Service: Cardiovascular;  Laterality: Right;   Family History  Problem Relation Age of Onset  . Heart disease Brother     Congenital  . Hypertension Brother   . Diabetes Brother   . Cancer Father     stomach  . CAD Father     MI at age 76  . Heart disease Father   . Heart attack Father   . Cancer Sister     female (uterus?)  . Hypertension Mother    History  Substance Use Topics  . Smoking status: Former Smoker -- 2 years    Types: Cigarettes    Quit date: 03/29/1953  . Smokeless tobacco: Former Systems developer  . Alcohol Use: Yes     Comment: occasional   OB History    No data available     Review of Systems  Constitutional: Positive for fatigue. Negative  for fever and unexpected weight change.  All other systems reviewed and are negative.     Allergies  Sulfa antibiotics  Home Medications   Prior to Admission medications   Medication Sig Start Date End Date Taking? Authorizing Provider  Acetaminophen (TYLENOL PO) Take 1 tablet by mouth as needed (pain).   Yes Historical Provider, MD  amLODipine (NORVASC) 5 MG tablet Take 1 tablet (5 mg total) by mouth at bedtime. Daily except do not take on dialysis days (mon,wed and fri) 01/10/14  Yes [name], MD  Artificial Tear Ointment (REFRESH P.M. OP) Place 1 application into the right eye daily.   Yes Historical Provider, MD  B Complex-C-Folic Acid (RENA-VITE PO) Take 1 tablet by mouth daily with breakfast.    Yes Historical Provider, MD  Calcium Carbonate Antacid (TUMS ULTRA 1000 PO) Take 2,000 mg by mouth 3 (three) times daily with meals.    Yes Historical Provider, MD  omeprazole (PRILOSEC) 20 MG capsule TAKE ONE CAPSULE BY MOUTH EVERY DAY 03/05/14  Yes [name], MD   simvastatin (ZOCOR) 20 MG tablet TAKE ONE TABLET BY MOUTH AT BEDTIME 12/11/13  Yes [name], MD   BP 149/64 mmHg  Pulse 83  Temp(Src) 97.6 F (36.4 C) (Oral)  Resp 20  SpO2 100% Physical Exam Physical Exam  Nursing note and vitals reviewed. Constitutional: She is oriented to person, place, and time. She appears well-developed and well-nourished. No distress.  HENT:  Head: Normocephalic and atraumatic.  Eyes: Pupils are equal, round, and reactive to light.  Neck: Normal range of motion.  Cardiovascular: Normal rate and intact distal pulses.   Pulmonary/Chest: No respiratory distress.  Abdominal: Normal appearance. She exhibits no distension.  No tenderness.  No rebound or guarding.   Musculoskeletal: Normal range of motion.  Neurological: She is alert and oriented to person, place, and time.  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 Skin: Skin is warm and dry. No rash noted.  Psychiatric: She has a normal mood and affect. Her behavior is normal.   ED Course  Procedures (including critical care time) Labs Review Labs Reviewed  CBC - Abnormal; Notable for the following:    RBC 2.63 (*)    Hemoglobin 7.7 (*)    HCT 24.9 (*)    RDW 19.2 (*)    All other components within normal limits  COMPREHENSIVE METABOLIC PANEL - Abnormal; Notable for the following:    Potassium 3.0 (*)    Glucose, Bld 104 (*)    Creatinine, Ser 3.14 (*)    Calcium 8.1 (*)    AST 42 (*)    Alkaline Phosphatase 130 (*)    GFR calc non Af Amer 13 (*)    GFR calc Af Amer 15 (*)    All other components within normal limits  POC OCCULT BLOOD, ED  TYPE AND SCREEN  ABO/RH   Stool was tested at bedside and was heme positive. Imaging Review No results found.  Patient will be admitted for anemia and GI blood loss.  MDM   Final diagnoses:  Gastrointestinal hemorrhage, unspecified gastritis, unspecified gastrointestinal hemorrhage type  Anemia, unspecified anemia type  Chronic renal  failure, unspecified stage        Dot Lanes, MD 04/08/14 1430

## 2014-04-08 NOTE — H&P (Addendum)
[facility] History and Physical  [name] TIW:[id number] DOB: 02-15-1932 DOA: 04/08/2014  Referring physician:   PCP: [name], MD   Chief Complaint:sob  HPI:  81 year old F sent to ED from dialysis with low hgb. She has a history of ESRD, dialysis MWF. Type 2 DM, resolved with weight loss and no meds for this on her list. Anemia of chronic disease. On daily Omeprazole for GERD but no previous EGDs or colonoscopy. On 1/716 had surgery on her right arm fistula.   Sent from HD today with low Hgb, in ED it is 7.7 with MCV of 94. On 12/1 at [facility] Hgb was 10.1 and steadily declined since. Per HD the Hgb last week was 7.8 and dose of Aranesp was increased. Was given stool cards which have yet to be submitted. She is c/o weakness, felt too weak to drive to HD per her usual routine. Also dyspneic with walking. No chest pain. No palpitations. No ASSA, NSAIDS. Rare ETOH. No dysphagia    Review of Systems: negative for the following  Constitutional: Positive for fatigue. Negative for fever and unexpected weight change HEENT: Denies photophobia, eye pain, redness, hearing loss, ear pain, congestion, sore throat, rhinorrhea, sneezing, mouth sores, trouble swallowing, neck pain, neck stiffness and tinnitus.  Respiratory: Denies SOB, DOE, cough, chest tightness, and wheezing.  Cardiovascular: Denies chest pain, palpitations and leg swelling.  Gastrointestinal: Denies nausea, vomiting, abdominal pain, diarrhea, constipation, blood in stool and abdominal distention.  Genitourinary: Denies dysuria, urgency, frequency, hematuria, flank pain and difficulty urinating.  Musculoskeletal: Denies myalgias, back pain, joint swelling, arthralgias and gait problem.  Skin: Denies pallor, rash and wound.  Neurological: Denies dizziness, seizures, syncope, weakness, light-headedness, numbness and headaches.  Hematological: Denies adenopathy. Easy bruising, personal or family bleeding history   Psychiatric/Behavioral: Denies suicidal ideation, mood changes, confusion, nervousness, sleep disturbance and agitation       Past Medical History  Diagnosis Date  . Type 2 diabetes with nephropathy     resolved with weight loss  . Hyperlipidemia   . Hypertension   . Anxiety   . Facial droop 1935    acquired during forceps delivery, some residual R visual loss  . Anemia in chronic kidney disease     IV iron infusion  . Hx of breast cancer 2004  . Arthritis     mild in lower back  . End stage renal disease on dialysis     HD M,W,F [place] AVF now LUE AVF  . GERD (gastroesophageal reflux disease)   . Cardiac murmur     a. thought due to AVF (2D echo 06/2012 without significant valvular disease).   . Bradycardia     a. Noted 06/2012.  Marland Kitchen Hypotension     a. Associated w/ dialysis.  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 [name])  . Osteoporosis 11/2013    DEXA -3.4 radius     Past Surgical History  Procedure Laterality Date  . Cataract extraction  1981  . Av fistula placement  07/20/10    Right brachiocephalic AVF  . Umbilical hernia repair    . Dexa  2013    [facility]  . US echocardiography  06/2012    LVH, EF 65%, grade 1 diastol dysfunction, mod dliated LAD  . Eye surgery  1966    regular cataract  . Breast lumpectomy Left   . Breast biopsy Left   . Bascilic vein transposition Left 07/24/2013  Procedure: BASILIC VEIN TRANSPOSITION;  Surgeon: [name], MD;  Location: [place];  Service: Vascular;  Laterality: Left;  . Shuntogram N/A 04/16/2011    Procedure: Earney Mallet;  Surgeon: [name], MD;  Location: [facility] CATH LAB;  Service: Cardiovascular;  Laterality: N/A;  . Shuntogram Right 07/05/2013    Procedure: FISTULOGRAM;  Surgeon: [name], MD;  Location[facility] C[facility] LAB;  Service: Cardiovascular;  Laterality: Right;      Social History:  reports that she quit smoking about 61 years ago. Her smoking use included  Cigarettes. She smoked 0.00 packs per day for 2 years. She has quit using smokeless tobacco. She reports that she drinks alcohol. She reports that she does not use illicit drugs.    Allergies  Allergen Reactions  . Sulfa Antibiotics Rash    Family History  Problem Relation Age of Onset  . Heart disease Brother     Congenital  . Hypertension Brother   . Diabetes Brother   . Cancer Father     stomach  . CAD Father     MI at age 38  . Heart disease Father   . Heart attack Father   . Cancer Sister     female (uterus?)  . Hypertension Mother      Prior to Admission medications   Medication Sig Start Date End Date Taking? Authorizing Provider  Acetaminophen (TYLENOL PO) Take 1 tablet by mouth as needed (pain).   Yes Historical Provider, MD  amLODipine (NORVASC) 5 MG tablet Take 1 tablet (5 mg total) by mouth at bedtime. Daily except do not take on dialysis days (mon,wed and fri) 01/10/14  Yes [name], MD  Artificial Tear Ointment (REFRESH P.M. OP) Place 1 application into the right eye daily.   Yes Historical Provider, MD  B Complex-C-Folic Acid (RENA-VITE PO) Take 1 tablet by mouth daily with breakfast.    Yes Historical Provider, MD  Calcium Carbonate Antacid (TUMS ULTRA 1000 PO) Take 2,000 mg by mouth 3 (three) times daily with meals.    Yes Historical Provider, MD  omeprazole (PRILOSEC) 20 MG capsule TAKE ONE CAPSULE BY MOUTH EVERY DAY 03/05/14  Yes [name], MD  simvastatin (ZOCOR) 20 MG tablet TAKE ONE TABLET BY MOUTH AT BEDTIME 12/11/13  Yes [name], MD     Physical Exam: Filed Vitals:   04/08/14 1315 04/08/14 1330 04/08/14 1350 04/08/14 1400  BP: 131/59 151/56 152/66 149/64  Pulse: 76 79 79 83  Temp:      TempSrc:      Resp: 17 21 21 20   SpO2: 99% 100% 100% 100%     Constitutional: Vital signs reviewed. Patient is a well-developed and well-nourished in no acute distress and cooperative with exam. Alert and oriented x3.  Head: Facial  asymmetry noted, present from birth  Ear: TM normal bilaterally  Mouth: no erythema or exudates, MMM  Eyes: PERRL, EOMI, conjunctivae normal, No scleral icterus.  Neck: Supple, Trachea midline normal ROM, No JVD, mass, thyromegaly, or carotid bruit present.  Cardiovascular: RRR, S1 normal, S2 normal, no MRG, pulses symmetric and intact bilaterally  Pulmonary/Chest: CTAB, no wheezes, rales, or rhonchi  Abdominal: Soft. Non-tender, non-distended, bowel sounds are normal, no masses, organomegaly, or guarding present.  GU: no CVA tenderness Musculoskeletal: No joint deformities, erythema, or stiffness, ROM full and no nontender Ext: no edema and no cyanosis, pulses palpable bilaterally (DP and PT)  Hematology: no cervical, inginal, or axillary adenopathy.  Neurological: A&O x3, Strenght is normal  and symmetric bilaterally, cranial nerve II-XII are grossly intact, no focal motor deficit, sensory intact to light touch bilaterally.  Skin: Warm, dry and intact. No rash, cyanosis, or clubbing.  Psychiatric: Normal mood and affect. speech and behavior is normal. Judgment and thought content normal. Cognition and memory are normal.       Labs on Admission:    Basic Metabolic Panel:  Recent Labs Lab 04/08/14 1207  NA 138  K 3.0*  CL 96  CO2 28  GLUCOSE 104*  BUN 14  CREATININE 3.14*  CALCIUM 8.1*   Liver Function Tests:  Recent Labs Lab 04/08/14 1207  AST 42*  ALT 12  ALKPHOS 130*  BILITOT 0.6  PROT 7.1  ALBUMIN 3.6   No results for input(s): LIPASE, AMYLASE in the last 168 hours. No results for input(s): AMMONIA in the last 168 hours. CBC:  Recent Labs Lab 04/08/14 1207  WBC 6.4  HGB 7.7*  HCT 24.9*  MCV 94.7  PLT 323   Cardiac Enzymes: No results for input(s): CKTOTAL, CKMB, CKMBINDEX, TROPONINI in the last 168 hours.  BNP (last 3 results) No results for input(s): PROBNP in the last 8760 hours.    CBG: No results for input(s): GLUCAP in the last 168  hours.  Radiological Exams on Admission: No results found.  EKG: Independently reviewed.    Assessment/Plan Active Problems:   Anemia   Symptomatic anemia   Normocytic anemia FOBT positive, significant drop since 12/1 Gastroenterologist consulted, EGD in the morning Full liquid diet, nothing by mouth after midnight History of peptic ulcerative disease Continue patient on twice a day PPI  End-stage renal disease on hemodialysis Monday Wednesday Friday Completed. Hemodialysis today Notify nephrology the morning, no urgent need to consult tonight  Type 2 diabetes Start the patient on exercise, check hemoglobin A1c  Hypertension Continue Norvasc         Code Status:   full Family Communication: bedside Disposition Plan: admit   Time spent: 70 mins   [name] Pager [phone]  If 7PM-7AM, please contact night-coverage [email or web address] Password [facility] 04/08/2014, 2:55 PM

## 2014-04-08 NOTE — ED Notes (Signed)
Attempted to call report

## 2014-04-08 NOTE — Progress Notes (Signed)
Attempted to call report.  RN stated she will call back.

## 2014-04-08 NOTE — ED Notes (Addendum)
Pt's friend Ree Shay phone # [phone].  Pt gave Barnett Applebaum her purse to take home.

## 2014-04-08 NOTE — ED Notes (Signed)
Hemocult stool positive for blood. Read at bedside by Dr. Audie Pinto.

## 2014-04-08 NOTE — Progress Notes (Signed)
New Admission Note:   Arrival: via stretcher from ED with tech Mental Orientation: A&Ox4 Telemetry: placed on box 6e22, NSR Assessment:  See doc flowsheet Skin: bruises on both arms, old fistula/graft in right arm-negative for bruit and thrill IV: right arm IV Pain: denies Safety Measures:  Call bell placed within reach; patient instructed on use of call bell and verbalized understanding. Bed in lowest position.  Yellow bracelet on.  Non-skid socks on.  Bed alarm refused by patient 6 [place] Orientation: Patient oriented to staff, room, and unit. Family: none   Orders have been reviewed and implemented. Patient stated that she is too tired to go through admission packet.  Will continue to monitor.  Arlyss Queen, RN, BSN

## 2014-04-08 NOTE — ED Notes (Signed)
Per pt sent here from dialysis with low hgb. sts some SOB and weakness. Denies bleeding.

## 2014-04-08 NOTE — Consult Note (Signed)
[facility] Consult: 3:55 PM 04/08/2014  LOS: 0 days    Referring Provider: Dr Audie Pinto  Primary Care Physician:  [name], MD Primary Gastroenterologist:  unassigned     Reason for Consultation:  Anemia, FOBT+   HPI: [name] is a 81 y.o. female.  ESRD, dialysis MWF.  Type 2 DM, resolved with weight loss and no meds for this on her list.  Anemia of chronic disease. On daily Omeprazole for GERD but no previous EGDs or colonoscopy. On 1/716 had surgery on her right arm fistula.    Sent from HD today with low Hgb, in ED it is 7.7 with MCV of 94. On 12/1 at [facility] Hgb was 10.1 and steadily declined since.  Per HD the Hgb last week was 7.8 and dose of Aranesp was increased.  Was given stool cards which have yet to be submitted. Has had no blood pr or melena.  ED doc says stool dark but not melenic and rapidly FOB+ She is c/o weakness, felt to weak to drive to HD per her usual routine.  Also dyspneic with walking.  No chest pain.  No palpitations. No ASSA, NSAIDS.  Rare ETOH. No dysphagia.  On prilosec that controls her heartburn sxs   Past Medical History  Diagnosis Date  . Type 2 diabetes with nephropathy     resolved with weight loss  . Hyperlipidemia   . Hypertension   . Anxiety   . Facial droop 1935    acquired during forceps delivery, some residual R visual loss  . Anemia in chronic kidney disease     IV iron infusion  . Hx of breast cancer 2004  . Arthritis     mild in lower back  . End stage renal disease on dialysis     HD M,W,F [place] AVF now LUE AVF  . GERD (gastroesophageal reflux disease)   . Cardiac murmur     a. thought due to AVF (2D echo 06/2012 without significant valvular disease).   . Bradycardia     a. Noted 06/2012.  Marland Kitchen Hypotension     a. Associated w/  dialysis.  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 [name])  . Osteoporosis 11/2013    DEXA -3.4 radius    Past Surgical History  Procedure Laterality Date  . Cataract extraction  1981  . Av fistula placement  07/20/10    Right brachiocephalic AVF  . Umbilical hernia repair  2011  . Dexa  2013    [facility]  . US echocardiography  06/2012    LVH, EF 65%, grade 1 diastol dysfunction, mod dliated LAD  . Eye surgery  1966    regular cataract  . Breast lumpectomy Left   . Breast biopsy Left   . Bascilic vein transposition Left 07/24/2013    Procedure: BASILIC VEIN TRANSPOSITION;  Surgeon: [name], MD;  Location: [place];  Service: Vascular;  Laterality: Left;  . Shuntogram N/A 04/16/2011    Procedure: Earney Mallet;  Surgeon: Jessy Oto  Fields, MD;  Location: [place] CATH LAB;  Service: Cardiovascular;  Laterality: N/A;  . Shuntogram Right 07/05/2013    Procedure: FISTULOGRAM;  Surgeon: [name], MD;  Location: [facility] CATH LAB;  Service: Cardiovascular;  Laterality: Right;    Prior to Admission medications   Medication Sig Start Date End Date Taking? Authorizing Provider  Acetaminophen (TYLENOL PO) Take 1 tablet by mouth as needed (pain).   Yes Historical Provider, MD  amLODipine (NORVASC) 5 MG tablet Take 1 tablet (5 mg total) by mouth at bedtime. Daily except do not take on dialysis days (mon,wed and fri) 01/10/14  Yes [name], MD  Artificial Tear Ointment (REFRESH P.M. OP) Place 1 application into the right eye daily.   Yes Historical Provider, MD  B Complex-C-Folic Acid (RENA-VITE PO) Take 1 tablet by mouth daily with breakfast.    Yes Historical Provider, MD  Calcium Carbonate Antacid (TUMS ULTRA 1000 PO) Take 2,000 mg by mouth 3 (three) times daily with meals.    Yes Historical Provider, MD  omeprazole (PRILOSEC) 20 MG capsule TAKE ONE CAPSULE BY MOUTH EVERY DAY 03/05/14  Yes [name], MD  simvastatin (ZOCOR) 20 MG tablet TAKE ONE TABLET BY MOUTH AT  BEDTIME 12/11/13  Yes [name], MD    Scheduled Meds: . docusate sodium  100 mg Oral BID  . pantoprazole  40 mg Oral BID  . sodium chloride  3 mL Intravenous Q12H  . sodium chloride  3 mL Intravenous Q12H   Infusions: . sodium chloride     PRN Meds: sodium chloride, acetaminophen **OR** acetaminophen, levalbuterol, ondansetron **OR** ondansetron (ZOFRAN) IV, sodium chloride   Allergies as of 04/08/2014 - Review Complete 04/08/2014  Allergen Reaction Noted  . Sulfa antibiotics Rash 12/14/2010    Family History  Problem Relation Age of Onset  . Heart disease Brother     Congenital  . Hypertension Brother   . Diabetes Brother   . Cancer Father     stomach  . CAD Father     MI at age 73  . Heart disease Father   . Heart attack Father   . Cancer Sister     female (uterus?)  . Hypertension Mother     History   Social History  . Marital Status: Single    Spouse Name: N/A    Number of Children: N/A  . Years of Education: N/A   Occupational History  . 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 Smoking status: Former Smoker -- 2 years    Types: Cigarettes    Quit date: 03/29/1953  . Smokeless tobacco: Former Systems developer  . Alcohol Use: Yes     Comment: occasional  . Drug Use: No  . Sexual Activity: Not on file   Other Topics Concern  . Not on file   Social History Narrative   As of 03/2013. Lives alone with her small dog.  Bother and sister in law died last year.     Occupation: retired, was Information systems manager for [employer]   Edu: some college    REVIEW OF SYSTEMS: Constitutional:  Stable weight.  Weakness per HPI ENT:  No nose bleeds Pulm:  No cough.  + DOE. CV:  No palpitations, some LE edema. Past stress testing but no cardiac caths.  GU:  No hematuria, no frequency GI:  Per HPI Heme:  Per HPI   Transfusions:  None ever  before.  Neuro:  No headaches, no peripheral tingling  or numbness MS:  Pain in right shoulder due to recent fall after tripping on furniture.  Derm:  No itching, no rash or sores.  Endocrine:  No sweats or chills.  No polyuria or dysuria Immunization:  Not queried.  Travel:  None beyond local counties in last few months.    PHYSICAL EXAM: Vital signs in last 24 hours: Filed Vitals:   04/08/14 1555  BP: 116/58  Pulse: 74  Temp:   Resp: 19   Wt Readings from Last 3 Encounters:  03/12/14 112 lb (50.803 kg)  01/10/14 118 lb 8 oz (53.751 kg)  11/13/13 114 lb 8 oz (51.937 kg)    General: pleasant, alert, comfortable.  Head:   face asymmetric due to birth related forceps injury  Eyes:  No icterus, no pallor Ears:  Not HOH  Nose:  No congestion or discharge Mouth:  Asymmetric, no blood.  Moist and clear MM. Neck:  No mass, no JVD Lungs:  Some rales in left base.  No cough or dyspnea Heart: RRR.  No mrg.  S1/s2 audible Abdomen:  Soft, NT, ND.  Active BS.  No mass, bruits, or HSM.    Rectal: did not repeat.  FOBT + and dark but not melenic per MD in ED   Musc/Skeltl: no joint swelling or redness.  + kyphosis.  Extremities:  No CCE.  Fistula in right arm  Neurologic:  Oriented x3.  Intelligent.   Skin:  No rash or sores.   Tattoos:  None.   Nodes:  No cervical adenopathy   Psych:  Pleasant, relaxed.   Intake/Output from previous day:   Intake/Output this shift:    LAB RESULTS:  Recent Labs  04/08/14 1207 04/08/14 1448  WBC 6.4 6.6  HGB 7.7* 7.4*  HCT 24.9* 23.7*  PLT 323 285  MCV     95  BMET Lab Results  Component Value Date   NA 138 04/08/2014   NA 140 07/24/2013   NA 142 07/05/2013   K 3.0* 04/08/2014   K 3.5* 07/24/2013   K 3.7 07/05/2013   CL 96 04/08/2014   CL 96 07/05/2013   CL 96 08/07/2012   CO2 28 04/08/2014   CO2 23 08/07/2012   CO2 31 07/24/2012   GLUCOSE 104* 04/08/2014   GLUCOSE 111* 07/24/2013   GLUCOSE 99 07/05/2013   BUN 14 04/08/2014   BUN 47* 07/05/2013   BUN 77* 08/07/2012    CREATININE 3.14* 04/08/2014   CREATININE 5.60* 07/05/2013   CREATININE 6.9 02/14/2013   CALCIUM 8.1* 04/08/2014   CALCIUM 9.7 08/07/2012   CALCIUM 9.4 07/24/2012   LFT  Recent Labs  04/08/14 1207  PROT 7.1  ALBUMIN 3.6  AST 42*  ALT 12  ALKPHOS 130*  BILITOT 0.6   PT/INR Lab Results  Component Value Date   INR 0.93 04/28/2011   INR 0.94 04/16/2011   INR 0.93 01/08/2010    RADIOLOGY STUDIES: No results found.  ENDOSCOPIC STUDIES: None ever  IMPRESSION:   *  FOBT +, normocytic anemia.  Symptomatic with DOE.  Hx anemia chronic disease. On Aranesp, parenteral iron at HD.   *  ESRD.  HD MWF  *  Hx GERD all sxs controlled with PPI  *  Elevated AST and Alk phos.  Not sure what to make of this in a dialysis pt.  No hx of liver disease    PLAN:     *  Spoke with pt re colon/egd.  She wants to think about it.    Azucena Freed  04/08/2014, 3:55 PM Pager: [phone]  GI Attending Note   Chart was reviewed and patient was examined. X-rays and lab were reviewed.   Pt is having a subacute GI bleed. Sources may include AVMs, active PUD (pt is on omeprazole), colonic polyps or neoplasm.  Would proceed with EGD as a first test since this is simplest  Sandy Salaam. Deatra Ina, M.D., [facility] Cell [phone]

## 2014-04-08 NOTE — ED Notes (Signed)
Admitting at bedside 

## 2014-04-09 ENCOUNTER — Encounter (HOSPITAL_COMMUNITY)
Admission: EM | Disposition: A | Discharge status: Home / Self Care | Payer: Self-pay | Source: Home / Self Care | Attending: Internal Medicine

## 2014-04-09 ENCOUNTER — Encounter (HOSPITAL_COMMUNITY): Payer: Self-pay | Admitting: *Deleted

## 2014-04-09 DIAGNOSIS — K31819 Angiodysplasia of stomach and duodenum without bleeding: Secondary | ICD-10-CM

## 2014-04-09 HISTORY — PX: ESOPHAGOGASTRODUODENOSCOPY: SHX5428

## 2014-04-09 LAB — COMPREHENSIVE METABOLIC PANEL
ALT: 10 U/L (ref 0–35)
AST: 33 U/L (ref 0–37)
Albumin: 3.2 g/dL — ABNORMAL LOW (ref 3.5–5.2)
Alkaline Phosphatase: 112 U/L (ref 39–117)
Anion gap: 11 (ref 5–15)
BUN: 23 mg/dL (ref 6–23)
CO2: 32 mmol/L (ref 19–32)
Calcium: 8.5 mg/dL (ref 8.4–10.5)
Chloride: 94 mEq/L — ABNORMAL LOW (ref 96–112)
Creatinine, Ser: 4.61 mg/dL — ABNORMAL HIGH (ref 0.50–1.10)
GFR calc Af Amer: 9 mL/min — ABNORMAL LOW (ref >90)
GFR calc non Af Amer: 8 mL/min — ABNORMAL LOW (ref >90)
Glucose, Bld: 84 mg/dL (ref 70–99)
Potassium: 3.4 mmol/L — ABNORMAL LOW (ref 3.5–5.1)
Sodium: 137 mmol/L (ref 135–145)
Total Bilirubin: 0.5 mg/dL (ref 0.3–1.2)
Total Protein: 6.1 g/dL (ref 6.0–8.3)

## 2014-04-09 LAB — MRSA PCR SCREENING: MRSA by PCR: POSITIVE — AB

## 2014-04-09 LAB — PREPARE RBC (CROSSMATCH)

## 2014-04-09 LAB — CBC
HCT: 24.1 % — ABNORMAL LOW (ref 36.0–46.0)
Hemoglobin: 7.4 g/dL — ABNORMAL LOW (ref 12.0–15.0)
MCH: 29.6 pg (ref 26.0–34.0)
MCHC: 30.7 g/dL (ref 30.0–36.0)
MCV: 96.4 fL (ref 78.0–100.0)
PLATELETS: 307 10*3/uL (ref 150–400)
RBC: 2.5 MIL/uL — ABNORMAL LOW (ref 3.87–5.11)
RDW: 19.6 % — AB (ref 11.5–15.5)
WBC: 5.7 10*3/uL (ref 4.0–10.5)

## 2014-04-09 LAB — TROPONIN I: Troponin I: 0.06 ng/mL — ABNORMAL HIGH (ref <0.031)

## 2014-04-09 SURGERY — EGD (ESOPHAGOGASTRODUODENOSCOPY)
Anesthesia: Moderate Sedation

## 2014-04-09 MED ORDER — FENTANYL CITRATE 0.05 MG/ML IJ SOLN
INTRAMUSCULAR | Status: DC | PRN
  Administered 2014-04-09: 10 ug via INTRAVENOUS
  Administered 2014-04-09 (×2): 20 ug via INTRAVENOUS

## 2014-04-09 MED ORDER — GLYCOPYRROLATE 0.2 MG/ML IJ SOLN
INTRAMUSCULAR | Status: AC
  Filled 2014-04-09: qty 1

## 2014-04-09 MED ORDER — RENA-VITE PO TABS
1.0000 | ORAL_TABLET | Freq: Every day | ORAL | Status: DC
  Administered 2014-04-09: 1 via ORAL
  Filled 2014-04-09 (×2): qty 1

## 2014-04-09 MED ORDER — MIDAZOLAM HCL 5 MG/5ML IJ SOLN
INTRAMUSCULAR | Status: DC | PRN
  Administered 2014-04-09: 2 mg via INTRAVENOUS
  Administered 2014-04-09: 1 mg via INTRAVENOUS

## 2014-04-09 MED ORDER — GLYCOPYRROLATE 0.2 MG/ML IJ SOLN
INTRAMUSCULAR | Status: DC | PRN
  Administered 2014-04-09: 0.2 mg via INTRAVENOUS

## 2014-04-09 MED ORDER — MIDAZOLAM HCL 5 MG/ML IJ SOLN
INTRAMUSCULAR | Status: AC
  Filled 2014-04-09: qty 2

## 2014-04-09 MED ORDER — DARBEPOETIN ALFA 150 MCG/0.3ML IJ SOSY
150.0000 ug | PREFILLED_SYRINGE | INTRAMUSCULAR | Status: DC
  Administered 2014-04-10: 150 ug via INTRAVENOUS
  Filled 2014-04-09: qty 0.3

## 2014-04-09 MED ORDER — BUTAMBEN-TETRACAINE-BENZOCAINE 2-2-14 % EX AERO
INHALATION_SPRAY | CUTANEOUS | Status: DC | PRN
  Administered 2014-04-09: 1 via TOPICAL

## 2014-04-09 MED ORDER — SODIUM CHLORIDE 0.9 % IV SOLN: Freq: Once | INTRAVENOUS | Status: DC

## 2014-04-09 MED ORDER — FENTANYL CITRATE 0.05 MG/ML IJ SOLN
INTRAMUSCULAR | Status: AC
  Filled 2014-04-09: qty 2

## 2014-04-09 MED ORDER — CALCIUM CARBONATE ANTACID 500 MG PO CHEW
400.0000 mg | CHEWABLE_TABLET | Freq: Three times a day (TID) | ORAL | Status: DC
  Administered 2014-04-09 – 2014-04-10 (×2): 400 mg via ORAL
  Filled 2014-04-09 (×4): qty 2

## 2014-04-09 NOTE — H&P (View-Only) (Signed)
[facility] Consult: 3:55 PM 04/08/2014  LOS: 0 days    Referring Provider: Dr Audie Pinto  Primary Care Physician:  [name], MD Primary Gastroenterologist:  unassigned     Reason for Consultation:  Anemia, FOBT+   HPI: [name] is a 81 y.o. female.  ESRD, dialysis MWF.  Type 2 DM, resolved with weight loss and no meds for this on her list.  Anemia of chronic disease. On daily Omeprazole for GERD but no previous EGDs or colonoscopy. On 1/716 had surgery on her right arm fistula.    Sent from HD today with low Hgb, in ED it is 7.7 with MCV of 94. On 12/1 at [facility] Hgb was 10.1 and steadily declined since.  Per HD the Hgb last week was 7.8 and dose of Aranesp was increased.  Was given stool cards which have yet to be submitted. Has had no blood pr or melena.  ED doc says stool dark but not melenic and rapidly FOB+ She is c/o weakness, felt to weak to drive to HD per her usual routine.  Also dyspneic with walking.  No chest pain.  No palpitations. No ASSA, NSAIDS.  Rare ETOH. No dysphagia.  On prilosec that controls her heartburn sxs   Past Medical History  Diagnosis Date  . Type 2 diabetes with nephropathy     resolved with weight loss  . Hyperlipidemia   . Hypertension   . Anxiety   . Facial droop 1935    acquired during forceps delivery, some residual R visual loss  . Anemia in chronic kidney disease     IV iron infusion  . Hx of breast cancer 2004  . Arthritis     mild in lower back  . End stage renal disease on dialysis     HD M,W,F [place] AVF now LUE AVF  . GERD (gastroesophageal reflux disease)   . Cardiac murmur     a. thought due to AVF (2D echo 06/2012 without significant valvular disease).   . Bradycardia     a. Noted 06/2012.  Marland Kitchen Hypotension     a. Associated w/  dialysis.  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 [name])  . Osteoporosis 11/2013    DEXA -3.4 radius    Past Surgical History  Procedure Laterality Date  . Cataract extraction  1981  . Av fistula placement  07/20/10    Right brachiocephalic AVF  . Umbilical hernia repair  2011  . Dexa  2013    [facility]  . US echocardiography  06/2012    LVH, EF 65%, grade 1 diastol dysfunction, mod dliated LAD  . Eye surgery  1966    regular cataract  . Breast lumpectomy Left   . Breast biopsy Left   . Bascilic vein transposition Left 07/24/2013    Procedure: BASILIC VEIN TRANSPOSITION;  Surgeon: [name], MD;  Location: [facility];  Service: Vascular;  Laterality: Left;  . Shuntogram N/A 04/16/2011    Procedure: Earney Mallet;  Surgeon: Jessy Oto  Fields, MD;  Location: [place] CATH LAB;  Service: Cardiovascular;  Laterality: N/A;  . Shuntogram Right 07/05/2013    Procedure: FISTULOGRAM;  Surgeon: [name], MD;  Location: [facility] CATH LAB;  Service: Cardiovascular;  Laterality: Right;    Prior to Admission medications   Medication Sig Start Date End Date Taking? Authorizing Provider  Acetaminophen (TYLENOL PO) Take 1 tablet by mouth as needed (pain).   Yes Historical Provider, MD  amLODipine (NORVASC) 5 MG tablet Take 1 tablet (5 mg total) by mouth at bedtime. Daily except do not take on dialysis days (mon,wed and fri) 01/10/14  Yes [name], MD  Artificial Tear Ointment (REFRESH P.M. OP) Place 1 application into the right eye daily.   Yes Historical Provider, MD  B Complex-C-Folic Acid (RENA-VITE PO) Take 1 tablet by mouth daily with breakfast.    Yes Historical Provider, MD  Calcium Carbonate Antacid (TUMS ULTRA 1000 PO) Take 2,000 mg by mouth 3 (three) times daily with meals.    Yes Historical Provider, MD  omeprazole (PRILOSEC) 20 MG capsule TAKE ONE CAPSULE BY MOUTH EVERY DAY 03/05/14  Yes [name], MD  simvastatin (ZOCOR) 20 MG tablet TAKE ONE TABLET BY MOUTH AT  BEDTIME 12/11/13  Yes [name], MD    Scheduled Meds: . docusate sodium  100 mg Oral BID  . pantoprazole  40 mg Oral BID  . sodium chloride  3 mL Intravenous Q12H  . sodium chloride  3 mL Intravenous Q12H   Infusions: . sodium chloride     PRN Meds: sodium chloride, acetaminophen **OR** acetaminophen, levalbuterol, ondansetron **OR** ondansetron (ZOFRAN) IV, sodium chloride   Allergies as of 04/08/2014 - Review Complete 04/08/2014  Allergen Reaction Noted  . Sulfa antibiotics Rash 12/14/2010    Family History  Problem Relation Age of Onset  . Heart disease Brother     Congenital  . Hypertension Brother   . Diabetes Brother   . Cancer Father     stomach  . CAD Father     MI at age 17  . Heart disease Father   . Heart attack Father   . Cancer Sister     female (uterus?)  . Hypertension Mother     History   Social History  . Marital Status: Single    Spouse Name: N/A    Number of Children: N/A  . Years of Education: N/A   Occupational History  . 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      retired   Social History Main Topics  . Smoking status: Former Smoker -- 2 years    Types: Cigarettes    Quit date: 03/29/1953  . Smokeless tobacco: Former Systems developer  . Alcohol Use: Yes     Comment: occasional  . Drug Use: No  . Sexual Activity: Not on file   Other Topics Concern  . Not on file   Social History Narrative   As of 03/2013. Lives alone with her small dog.  Bother and sister in law died last year.     Occupation: retired, was Information systems manager for [employer]   Edu: some college    REVIEW OF SYSTEMS: Constitutional:  Stable weight.  Weakness per HPI ENT:  No nose bleeds Pulm:  No cough.  + DOE. CV:  No palpitations, some LE edema. Past stress testing but no cardiac caths.  GU:  No hematuria, no frequency GI:  Per HPI Heme:  Per HPI   Transfusions:  None ever  before.  Neuro:  No headaches, no peripheral tingling  or numbness MS:  Pain in right shoulder due to recent fall after tripping on furniture.  Derm:  No itching, no rash or sores.  Endocrine:  No sweats or chills.  No polyuria or dysuria Immunization:  Not queried.  Travel:  None beyond local counties in last few months.    PHYSICAL EXAM: Vital signs in last 24 hours: Filed Vitals:   04/08/14 1555  BP: 116/58  Pulse: 74  Temp:   Resp: 19   Wt Readings from Last 3 Encounters:  03/12/14 112 lb (50.803 kg)  01/10/14 118 lb 8 oz (53.751 kg)  11/13/13 114 lb 8 oz (51.937 kg)    General: pleasant, alert, comfortable.  Head:   face asymmetric due to birth related forceps injury  Eyes:  No icterus, no pallor Ears:  Not HOH  Nose:  No congestion or discharge Mouth:  Asymmetric, no blood.  Moist and clear MM. Neck:  No mass, no JVD Lungs:  Some rales in left base.  No cough or dyspnea Heart: RRR.  No mrg.  S1/s2 audible Abdomen:  Soft, NT, ND.  Active BS.  No mass, bruits, or HSM.    Rectal: did not repeat.  FOBT + and dark but not melenic per MD in ED   Musc/Skeltl: no joint swelling or redness.  + kyphosis.  Extremities:  No CCE.  Fistula in right arm  Neurologic:  Oriented x3.  Intelligent.   Skin:  No rash or sores.   Tattoos:  None.   Nodes:  No cervical adenopathy   Psych:  Pleasant, relaxed.   Intake/Output from previous day:   Intake/Output this shift:    LAB RESULTS:  Recent Labs  04/08/14 1207 04/08/14 1448  WBC 6.4 6.6  HGB 7.7* 7.4*  HCT 24.9* 23.7*  PLT 323 285  MCV     95  BMET Lab Results  Component Value Date   NA 138 04/08/2014   NA 140 07/24/2013   NA 142 07/05/2013   K 3.0* 04/08/2014   K 3.5* 07/24/2013   K 3.7 07/05/2013   CL 96 04/08/2014   CL 96 07/05/2013   CL 96 08/07/2012   CO2 28 04/08/2014   CO2 23 08/07/2012   CO2 31 07/24/2012   GLUCOSE 104* 04/08/2014   GLUCOSE 111* 07/24/2013   GLUCOSE 99 07/05/2013   BUN 14 04/08/2014   BUN 47* 07/05/2013   BUN 77* 08/07/2012    CREATININE 3.14* 04/08/2014   CREATININE 5.60* 07/05/2013   CREATININE 6.9 02/14/2013   CALCIUM 8.1* 04/08/2014   CALCIUM 9.7 08/07/2012   CALCIUM 9.4 07/24/2012   LFT  Recent Labs  04/08/14 1207  PROT 7.1  ALBUMIN 3.6  AST 42*  ALT 12  ALKPHOS 130*  BILITOT 0.6   PT/INR Lab Results  Component Value Date   INR 0.93 04/28/2011   INR 0.94 04/16/2011   INR 0.93 01/08/2010    RADIOLOGY STUDIES: No results found.  ENDOSCOPIC STUDIES: None ever  IMPRESSION:   *  FOBT +, normocytic anemia.  Symptomatic with DOE.  Hx anemia chronic disease. On Aranesp, parenteral iron at HD.   *  ESRD.  HD MWF  *  Hx GERD all sxs controlled with PPI  *  Elevated AST and Alk phos.  Not sure what to make of this in a dialysis pt.  No hx of liver disease    PLAN:     *  Spoke with pt re colon/egd.  She wants to think about it.    Azucena Freed  04/08/2014, 3:55 PM Pager: [phone]  GI Attending Note   Chart was reviewed and patient was examined. X-rays and lab were reviewed.   Pt is having a subacute GI bleed. Sources may include AVMs, active PUD (pt is on omeprazole), colonic polyps or neoplasm.  Would proceed with EGD as a first test since this is simplest  Sandy Salaam. Deatra Ina, M.D., [facility] Cell [phone]

## 2014-04-09 NOTE — Progress Notes (Signed)
Patient has GAVE (gastric antral vascular ectasia) syndrome.  This is the source for chronic GI blood loss.  Stomach was treated with the argon plasma coagulator to ablate the lesions.  Recommendations #1 twice a day by mouth twice a day therapy for 2 weeks then daily #2 I supplementation #3 follow CBC every 2-4 weeks #4 if stable may DC in a.m.

## 2014-04-09 NOTE — Consult Note (Signed)
Indication for Consultation:  Management of ESRD/hemodialysis; anemia, hypertension/volume and secondary hyperparathyroidism  HPI: [name] is a 81 y.o. female with ESRD (MWF), DM2, anemia of chronic disease who presented from HD to the ED with a Hgb of 7.7. Hgb has declined gradually (12/23) 8.3, (12/30) 7.8, (1/6) 7.2 TSat 20%. Aranesp was increased last week and she was started on iron bolus, she did not submit stool cards. GI was consulted and performed a EGD with control of bleeding and EGD with ablation. No immediate complications. Pt diagnoses with gastric antral vascular ectasia (GAVE) syndrome. Anticipated discharge after hemodialysis tomorrow.   Past Medical History  Diagnosis Date  . Type 2 diabetes with nephropathy     resolved with weight loss  . Hyperlipidemia   . Hypertension   . Anxiety   . Facial droop 1935    acquired during forceps delivery, some residual R visual loss  . Anemia in chronic kidney disease     IV iron infusion  . Hx of breast cancer 2004  . Arthritis     mild in lower back  . End stage renal disease on dialysis     HD M,W,F [place] AVF now LUE AVF  . GERD (gastroesophageal reflux disease)   . Cardiac murmur     a. thought due to AVF (2D echo 06/2012 without significant valvular disease).   . Bradycardia     a. Noted 06/2012.  Marland Kitchen Hypotension     a. Associated w/ dialysis.  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 [name])  . Osteoporosis 11/2013    DEXA -3.4 radius   Past Surgical History  Procedure Laterality Date  . Cataract extraction  1981  . Av fistula placement  07/20/10    Right brachiocephalic AVF  . Umbilical hernia repair  2011  . Dexa  2013    [facility]  . US echocardiography  06/2012    LVH, EF 65%, grade 1 diastol dysfunction, mod dliated LAD  . Eye surgery  1966    regular cataract  . Breast lumpectomy Left   . Breast biopsy Left   . Bascilic vein transposition Left 07/24/2013   Procedure: BASILIC VEIN TRANSPOSITION;  Surgeon: [name], MD;  Location: [place];  Service: Vascular;  Laterality: Left;  . Shuntogram N/A 04/16/2011    Procedure: Earney Mallet;  Surgeon: [name], MD;  Location: [facility] CATH LAB;  Service: Cardiovascular;  Laterality: N/A;  . Shuntogram Right 07/05/2013    Procedure: FISTULOGRAM;  Surgeon: [name], MD;  Location: [facility] CATH LAB;  Service: Cardiovascular;  Laterality: Right;   Family History  Problem Relation Age of Onset  . Heart disease Brother     Congenital  . Hypertension Brother   . Diabetes Brother   . Cancer Father     stomach  . CAD Father     MI at age 66  . Heart disease Father   . Heart attack Father   . Cancer Sister     female (uterus?)  . Hypertension Mother    Social History:  reports that she quit smoking about 61 years ago. Her smoking use included Cigarettes. She smoked 0.00 packs per day for 2 years. She has quit using smokeless tobacco. She reports that she drinks alcohol. She reports that she does not use illicit drugs. Allergies  Allergen Reactions  . Sulfa Antibiotics Rash   Prior to Admission medications   Medication Sig Start  Date End Date Taking? Authorizing Provider  Acetaminophen (TYLENOL PO) Take 1 tablet by mouth as needed (pain).   Yes Historical Provider, MD  amLODipine (NORVASC) 5 MG tablet Take 1 tablet (5 mg total) by mouth at bedtime. Daily except do not take on dialysis days (mon,wed and fri) 01/10/14  Yes [name], MD  Artificial Tear Ointment (REFRESH P.M. OP) Place 1 application into the right eye daily.   Yes Historical Provider, MD  B Complex-C-Folic Acid (RENA-VITE PO) Take 1 tablet by mouth daily with breakfast.    Yes Historical Provider, MD  Calcium Carbonate Antacid (TUMS ULTRA 1000 PO) Take 2,000 mg by mouth 3 (three) times daily with meals.    Yes Historical Provider, MD  omeprazole (PRILOSEC) 20 MG capsule TAKE ONE CAPSULE BY MOUTH EVERY DAY 03/05/14  Yes [name], MD  simvastatin (ZOCOR) 20 MG tablet TAKE ONE TABLET BY MOUTH AT BEDTIME 12/11/13  Yes [name], MD   Current Facility-Administered Medications  Medication Dose Route Frequency Provider Last Rate Last Dose  . 0.9 %  sodium chloride infusion  250 mL Intravenous PRN Reyne Dumas, MD      . 0.9 %  sodium chloride infusion   Intravenous Once Reyne Dumas, MD      . acetaminophen (TYLENOL) tablet 650 mg  650 mg Oral Q6H PRN Reyne Dumas, MD       Or  . acetaminophen (TYLENOL) suppository 650 mg  650 mg Rectal Q6H PRN Reyne Dumas, MD      . amLODipine (NORVASC) tablet 5 mg  5 mg Oral Once per day on Sun Tue Thu Sat Reyne Dumas, MD   5 mg at 04/08/14 2130  . docusate sodium (COLACE) capsule 100 mg  100 mg Oral BID Reyne Dumas, MD   100 mg at 04/09/14 1033  . levalbuterol (XOPENEX) nebulizer solution 0.63 mg  0.63 mg Nebulization Q6H PRN Reyne Dumas, MD      . ondansetron (ZOFRAN) tablet 4 mg  4 mg Oral Q6H PRN Reyne Dumas, MD       Or  . ondansetron (ZOFRAN) injection 4 mg  4 mg Intravenous Q6H PRN Reyne Dumas, MD      . pantoprazole (PROTONIX) EC tablet 40 mg  40 mg Oral BID Reyne Dumas, MD   40 mg at 04/09/14 1033  . simvastatin (ZOCOR) tablet 20 mg  20 mg Oral QHS Reyne Dumas, MD   20 mg at 04/08/14 2049  . sodium chloride 0.9 % injection 3 mL  3 mL Intravenous Q12H Reyne Dumas, MD   3 mL at 04/09/14 1033  . sodium chloride 0.9 % injection 3 mL  3 mL Intravenous Q12H Reyne Dumas, MD      . sodium chloride 0.9 % injection 3 mL  3 mL Intravenous PRN Reyne Dumas, MD       Labs: Basic Metabolic Panel:  Recent Labs Lab 04/08/14 1207 04/09/14 0232  NA 138 137  K 3.0* 3.4*  CL 96 94*  CO2 28 32  GLUCOSE 104* 84  BUN 14 23  CREATININE 3.14* 4.61*  CALCIUM 8.1* 8.5   Liver Function Tests:  Recent Labs Lab 04/08/14 1207 04/09/14 0232  AST 42* 33  ALT 12 10  ALKPHOS 130* 112  BILITOT 0.6 0.5  PROT 7.1 6.1  ALBUMIN 3.6 3.2*   No results for input(s):  LIPASE, AMYLASE in the last 168 hours. No results for input(s): AMMONIA in the last 168 hours. CBC:  Recent Labs Lab  04/08/14 1207 04/08/14 1448 04/09/14 0232  WBC 6.4 6.6 5.7  HGB 7.7* 7.4* 7.4*  HCT 24.9* 23.7* 24.1*  MCV 94.7 95.6 96.4  PLT 323 285 307   Cardiac Enzymes:  Recent Labs Lab 04/08/14 1448 04/08/14 2025 04/09/14 0232  TROPONINI 0.06* 0.06* 0.06*   CBG:  Recent Labs Lab 04/08/14 2209  GLUCAP 90   Iron Studies: No results for input(s): IRON, TIBC, TRANSFERRIN, FERRITIN in the last 72 hours. Studies/Results: No results found.  ROS:  Review of Systems: Gen: Denies  fatigue, weakness, dizziness CV: Denies chest pain, peripheral edema,  Resp: Reported dyspnea at rest and dyspnea with exercise yesterday but resolved today GI: Denies N/V   Physical Exam: Filed Vitals:   04/09/14 0852 04/09/14 0855 04/09/14 0900 04/09/14 0920  BP: 130/40 123/46  138/53  Pulse:    90  Temp:    97.7 F (36.5 C)  TempSrc:    Oral  Resp: 20 20  22   Height:      Weight:      SpO2: 93% 92% 90% 95%     General: Well developed, well nourished, in no acute distress. HEENT: Facial asymmetry noted, present from birth, right facial droop present since birth.   Lungs: Clear bilaterally to auscultation without wheezes, rales, or rhonchi. Breathing is unlabored. Heart: RRR with S1 S2. No murmurs, rubs, or gallops appreciated. Abdomen: Soft, non-tender, non-distended with normoactive bowel sounds. No obvious abdominal masses. Lower extremities:without edema or ischemic changes, no open wounds, pedal pulses equal bilaterally  Neuro: Alert and oriented X 3. Moves all extremities spontaneously. Psych:  Responds to questions appropriately with a normal affect. Dialysis Access: L AVF +b/t  Dialysis Orders: Center: [facility] on MWF  52 kg 2/2 3 hr 15 min 1,000 U Heparin  L AVF 350/800 Mircera 225 mcg IV every 2 weeks Venofer  50 mg q/weeK Other Venofer 100 mg IV every treatment  x 10 times -(1/4-1/25)  Assessment/Plan: 1.  Normocytic Anemia 2/2 GAVE syndrome - GI consulted. EGD with ablation performed 1/12. GI recommended PPI twice a day for 2 weeks and then daily PPI. Denies any bleeding 2.  ESRD -  MWF , K+ 3.4 (1/12), HD tomorrow 3.  Hypertension/volume  - 138/53, BP medication amlodipine, Wt 50.803 4.  Anemia  - Hgb 7.4 (1/12), 2 U of packed RBC with HD tomorrow. Continue Venofer and Aranesp. Transition Aranesp to Micera 225 mcg IV every 2 wks upon discharge.  5.  Metabolic bone disease -  Ca 8.5 (1/12), P 5.7 and PTH 350 (12/26) cont Tums 6.  Nutrition - Alb 3.2 (1/12), renal diet, multivit  7. DM2 - primary, controled with diet and wt.  8. Dispo - Tomorrow after HD if Hgb stable.  Shelle Iron, NP [facility] [phone] 04/09/2014, 3:35 PM   Pt seen, examined and agree w A/P as above.  Jill Splinter MD pager [phone]    cell [phone] 04/10/2014, 10:42 AM

## 2014-04-09 NOTE — Progress Notes (Addendum)
[facility] PROGRESS NOTE  [name]:[id number] DOB: Nov 24, 1931 DOA: 04/08/2014 PCP: [name], MD  Assessment/Plan: Active Problems:   Anemia   Symptomatic anemia   GI bleed   GAVE (gastric antral vascular ectasia)     Normocytic anemia secondary to Patient has GAVE (gastric antral vascular ectasia) syndrome GI recommends twice a day PPI twice a day by mouth twice a day therapy for 2 weeks then daily Advance diet to renal, CBC every 2-4 weeks Anticipate discharge in the morning if hemoglobin is stable, after hemodialysis Family requesting transfusion prior to discharge Will order 2 units with hemodialysis    End-stage renal disease on hemodialysis Monday Wednesday Friday Completed. Hemodialysis 1/11 Discussed with Dr. Jonnie Finner, anticipate dialysis prior to discharge in the morning  Type 2 diabetes Continuous sliding scale insulin  Hypertension Continue Norvasc   Code Status: full Family Communication: family updated about patient's clinical progress Disposition Plan: Anticipate discharge tomorrow   Brief narrative: 81 year old F sent to ED from dialysis with low hgb. She has a history of ESRD, dialysis MWF. Type 2 DM, resolved with weight loss and no meds for this on her list. Anemia of chronic disease. On daily Omeprazole for GERD but no previous EGDs or colonoscopy. On 1/716 had surgery on her right arm fistula.   Sent from HD today with low Hgb, in ED it is 7.7 with MCV of 94. On 12/1 at [facility] Hgb was 10.1 and steadily declined since. Per HD the Hgb last week was 7.8 and dose of Aranesp was increased. Was given stool cards which have yet to be submitted. She is c/o weakness, felt too weak to drive to HD per her usual routine. Also dyspneic with walking. No chest pain. No palpitations. No ASSA, NSAIDS. Rare ETOH. No  dysphagia   Consultants:  Nephrology  Gastroenterology    Procedures:  EGD    Antibiotics: None  HPI/Subjective: Denies any chest pain shortness of breath, nausea, vomiting, melena  Objective: Filed Vitals:   04/09/14 0852 04/09/14 0855 04/09/14 0900 04/09/14 0920  BP: 130/40 123/46  138/53  Pulse:    90  Temp:    97.7 F (36.5 C)  TempSrc:    Oral  Resp: 20 20  22   Height:      Weight:      SpO2: 93% 92% 90% 95%    Intake/Output Summary (Last 24 hours) at 04/09/14 1412 Last data filed at 04/09/14 0900  Gross per 24 hour  Intake      0 ml  Output      0 ml  Net      0 ml    Exam:  General: alert & oriented x 3 In NAD  Cardiovascular: RRR, nl S1 s2  Respiratory: Decreased breath sounds at the bases, scattered rhonchi, no crackles  Abdomen: soft +BS NT/ND, no masses palpable  Extremities: No cyanosis and no edema      Data Reviewed: Basic Metabolic Panel:  Recent Labs Lab 04/08/14 1207 04/09/14 0232  NA 138 137  K 3.0* 3.4*  CL 96 94*  CO2 28 32  GLUCOSE 104* 84  BUN 14 23  CREATININE 3.14* 4.61*  CALCIUM 8.1* 8.5    Liver Function Tests:  Recent Labs Lab 04/08/14 1207 04/09/14 0232  AST 42* 33  ALT 12 10  ALKPHOS 130* 112  BILITOT 0.6 0.5  PROT 7.1 6.1  ALBUMIN 3.6 3.2*   No results for input(s): LIPASE, AMYLASE in the last 168  hours. No results for input(s): AMMONIA in the last 168 hours.  CBC:  Recent Labs Lab 04/08/14 1207 04/08/14 1448 04/09/14 0232  WBC 6.4 6.6 5.7  HGB 7.7* 7.4* 7.4*  HCT 24.9* 23.7* 24.1*  MCV 94.7 95.6 96.4  PLT 323 285 307    Cardiac Enzymes:  Recent Labs Lab 04/08/14 1448 04/08/14 2025 04/09/14 0232  TROPONINI 0.06* 0.06* 0.06*   BNP (last 3 results) No results for input(s): PROBNP in the last 8760 hours.   CBG:  Recent Labs Lab 04/08/14 2209  GLUCAP 90    Recent Results (from the past 240 hour(s))  MRSA PCR Screening     Status: Abnormal   Collection Time: 04/08/14  11:30 PM  Result Value Ref Range Status   MRSA by PCR POSITIVE (A) NEGATIVE Final    Comment:        The GeneXpert MRSA Assay (FDA approved for NASAL specimens only), is one component of a comprehensive MRSA colonization surveillance program. It is not intended to diagnose MRSA infection nor to guide or monitor treatment for MRSA infections. RESULT CALLED TO, READ BACK BY AND VERIFIED WITH: QIU,B RN 0231 04/09/14 MITCHELL,L      Studies: Dg Shoulder Right  03/12/2014   CLINICAL DATA:  Right shoulder injury. Fall 1.5 weeks ago. Pain. Initial encounter.  EXAM: RIGHT SHOULDER - 2+ VIEW  COMPARISON:  None.  FINDINGS: The acromiohumeral distance has been eliminated, compatible with a chronic supraspinatus rupture.  Mild spurring of the humeral head. Subacromial morphology is type 2 (curved). There is old granulomatous disease in the chest.  No fracture is observed.  IMPRESSION: 1. Elimination of the acromion humeral distance compatible with chronic supraspinatus tendon rupture. 2. Mild degenerative spurring of the right humeral head.   Electronically Signed   By: Sherryl Barters M.D.   On: 03/12/2014 17:22    Scheduled Meds: . amLODipine  5 mg Oral Once per day on Sun Tue Thu Sat  . docusate sodium  100 mg Oral BID  . pantoprazole  40 mg Oral BID  . simvastatin  20 mg Oral QHS  . sodium chloride  3 mL Intravenous Q12H  . sodium chloride  3 mL Intravenous Q12H   Continuous Infusions:   Active Problems:   Anemia   Symptomatic anemia   GI bleed   GAVE (gastric antral vascular ectasia)    Time spent: 40 minutes   [facility] Pager [phone]. If 7PM-7AM, please contact night-coverage at [email or web address], password [facility] 04/09/2014, 2:12 PM  LOS: 1 day

## 2014-04-09 NOTE — Interval H&P Note (Signed)
History and Physical Interval Note:  04/09/2014 8:19 AM  [name]  has presented today for surgery, with the diagnosis of anemia and blood detected in stool  The various methods of treatment have been discussed with the patient and family. After consideration of risks, benefits and other options for treatment, the patient has consented to  Procedure(s): ESOPHAGOGASTRODUODENOSCOPY (EGD) (N/A) as a surgical intervention .  The patient's history has been reviewed, patient examined, no change in status, stable for surgery.  I have reviewed the patient's chart and labs.  Questions were answered to the patient's satisfaction.    The recent H&P (dated **04/08/14*) was reviewed, the patient was examined and there is no change in the patients condition since that H&P was completed.   Erskine Emery  04/09/2014, 8:19 AM    Erskine Emery

## 2014-04-09 NOTE — Op Note (Signed)
[facility] [place], [address]   ENDOSCOPY PROCEDURE REPORT  PATIENT: [name], [name]  MR#: [id number] BIRTHDATE: [birth year] , 82  yrs. old GENDER: female ENDOSCOPIST: [name], MD REFERRED BY: PROCEDURE DATE:  04/09/2014 PROCEDURE:  EGD w/ control of bleeding and EGD w/ ablation ASA CLASS:     Class III INDICATIONS:  heme positive stool and acute post hemorrhagic anemia.  MEDICATIONS: Versed 3 mg IV and 46mcg IV TOPICAL ANESTHETIC: Cetacaine Spray  DESCRIPTION OF PROCEDURE: After the risks benefits and alternatives of the procedure were thoroughly explained, informed consent was obtained.  The PENTAX GASTOROSCOPE [id number] endoscope was introduced through the mouth and advanced to the second portion of the duodenum , Without limitations.  The instrument was slowly withdrawn as the mucosa was fully examined.    STOMACH: An innumerable number of small diffuse vascular ectasia arteriovenous malformations were found in the gastric antrum. Argon plasma coagulation was applied to the sites.  With good treatment effect.   A 3 cm hiatal hernia was noted.   Except for the findings listed the EGD was otherwise normal.  Retroflexed views revealed no abnormalities.     The scope was then withdrawn from the patient and the procedure completed.  COMPLICATIONS: There were no immediate complications.  ENDOSCOPIC IMPRESSION: 1.   Innumerable number of small arteriovenous malformations were found in the gastric antrum; Argon plasma coagulation was applied to the sites; with good treatment effect 2.   3 cm hiatal hernia 3.   EGD was otherwise normal  RECOMMENDATIONS: twice a day PPI therapy for 2 weeks, then daily Iron supplementation Check CBC every 2 and then 4 weeks for stability Repeat APC treatment as needed  REPEAT EXAM:  eSigned:  [name], MD 04/09/2014 8:57 AM    HE:KBTCYELYH, Garlon Hatchet MD and Marylynn Pearson  MD  PATIENT NAME:  [name], [name] MR#: [id number]

## 2014-04-10 ENCOUNTER — Encounter (HOSPITAL_COMMUNITY): Payer: Self-pay | Admitting: Gastroenterology

## 2014-04-10 LAB — BASIC METABOLIC PANEL
Anion gap: 11 (ref 5–15)
BUN: 36 mg/dL — ABNORMAL HIGH (ref 6–23)
CO2: 28 mmol/L (ref 19–32)
CREATININE: 6.96 mg/dL — AB (ref 0.50–1.10)
Calcium: 8.4 mg/dL (ref 8.4–10.5)
Chloride: 93 mEq/L — ABNORMAL LOW (ref 96–112)
GFR calc Af Amer: 6 mL/min — ABNORMAL LOW (ref >90)
GFR calc non Af Amer: 5 mL/min — ABNORMAL LOW (ref >90)
GLUCOSE: 117 mg/dL — AB (ref 70–99)
Potassium: 3.8 mmol/L (ref 3.5–5.1)
Sodium: 132 mmol/L — ABNORMAL LOW (ref 135–145)

## 2014-04-10 LAB — ALBUMIN: Albumin: 3.3 g/dL — ABNORMAL LOW (ref 3.5–5.2)

## 2014-04-10 LAB — PHOSPHORUS: PHOSPHORUS: 5.2 mg/dL — AB (ref 2.3–4.6)

## 2014-04-10 LAB — CBC
HCT: 24.9 % — ABNORMAL LOW (ref 36.0–46.0)
HEMOGLOBIN: 7.7 g/dL — AB (ref 12.0–15.0)
MCH: 29.6 pg (ref 26.0–34.0)
MCHC: 30.9 g/dL (ref 30.0–36.0)
MCV: 95.8 fL (ref 78.0–100.0)
Platelets: 349 10*3/uL (ref 150–400)
RBC: 2.6 MIL/uL — AB (ref 3.87–5.11)
RDW: 19.1 % — ABNORMAL HIGH (ref 11.5–15.5)
WBC: 8.7 10*3/uL (ref 4.0–10.5)

## 2014-04-10 LAB — HEPATITIS B SURFACE ANTIGEN: Hepatitis B Surface Ag: NEGATIVE

## 2014-04-10 LAB — HEMOGLOBIN A1C
Hgb A1c MFr Bld: 4.8 % (ref <5.7)
Mean Plasma Glucose: 91 mg/dL (ref <117)

## 2014-04-10 LAB — GLUCOSE, CAPILLARY: Glucose-Capillary: 84 mg/dL (ref 70–99)

## 2014-04-10 MED ORDER — DARBEPOETIN ALFA 150 MCG/0.3ML IJ SOSY
PREFILLED_SYRINGE | INTRAMUSCULAR | Status: AC
  Filled 2014-04-10: qty 0.3

## 2014-04-10 MED ORDER — DSS 100 MG PO CAPS: 100.0000 mg | ORAL_CAPSULE | Freq: Two times a day (BID) | ORAL | Status: DC

## 2014-04-10 MED ORDER — PANTOPRAZOLE SODIUM 40 MG PO TBEC: 40.0000 mg | DELAYED_RELEASE_TABLET | Freq: Two times a day (BID) | ORAL | Status: DC

## 2014-04-10 NOTE — Progress Notes (Signed)
  [facility] Progress Note   Subjective: no complaints  Filed Vitals:   04/10/14 0930 04/10/14 0945 04/10/14 1000 04/10/14 1010  BP: 129/84 161/83 161/83 180/80  Pulse: 80 81 78 77  Temp: 98.2 F (36.8 C) 97.7 F (36.5 C)  97.7 F (36.5 C)  TempSrc: Oral Oral  Oral  Resp: 19 22 22 19   Height:      Weight:      SpO2:       Exam: Alert, no distress R facial droop, long-standing per pt No jvd Chest clear bilat RRR no MRG Abd soft, NTND, +BS No LE or UE edema Neuro is nf, Ox 3 except facial droop L arm AVF patent  HD: MWF [place] 3h 9min    2/2.0 Bath   52kg   Heparin 1000 bolus   350/800   L AVF Mircera 225 ug every 2 weeks, Venofer 100/ hd thru 1/25 then 50 / wk       Assessment: 1. 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 ESRD - MWF HD 3. Hypertension/volume - 138/53, BP medication amlodipine, close to dry wt 4. Anemia - Hgb 7.4 (1/12), 2u prbc with HD today, will transition Aranesp to Micera 225 mcg IV every 2 wks upon discharge.  5. Metabolic bone disease - Ca 8.5 (1/12), P 5.7 and PTH 350 (12/26) cont Tums 6. Nutrition - Alb 3.2 (1/12), renal diet, multivit  7. DM2 - primary, controled with diet and wt.  8. Dispo - poss dc today after HD  Plan- HD today, transfusing prbc's    Kelly Splinter MD  pager [phone]    cell [phone]  04/10/2014, 10:44 AM     Recent Labs Lab 04/08/14 1207 04/09/14 0232 04/10/14 0700  NA 138 137 132*  K 3.0* 3.4* 3.8  CL 96 94* 93*  CO2 28 32 28  GLUCOSE 104* 84 117*  BUN 14 23 36*  CREATININE 3.14* 4.61* 6.96*  CALCIUM 8.1* 8.5 8.4  PHOS  --   --  5.2*    Recent Labs Lab 04/08/14 1207 04/09/14 0232 04/10/14 0700  AST 42* 33  --   ALT 12 10  --   ALKPHOS 130* 112  --   BILITOT 0.6 0.5  --   PROT 7.1 6.1  --   ALBUMIN 3.6 3.2* 3.3*    Recent Labs Lab 04/08/14 1448 04/09/14 0232 04/10/14 0700  WBC 6.6 5.7 8.7  HGB 7.4* 7.4* 7.7*  HCT 23.7* 24.1*  24.9*  MCV 95.6 96.4 95.8  PLT 285 307 349   . sodium chloride   Intravenous Once  . amLODipine  5 mg Oral Once per day on Sun Tue Thu Sat  . calcium carbonate  400 mg of elemental calcium Oral TID WC  . Darbepoetin Alfa      . darbepoetin (ARANESP) injection - DIALYSIS  150 mcg Intravenous Q Wed-HD  . docusate sodium  100 mg Oral BID  . multivitamin  1 tablet Oral QHS  . pantoprazole  40 mg Oral BID  . simvastatin  20 mg Oral QHS  . sodium chloride  3 mL Intravenous Q12H  . sodium chloride  3 mL Intravenous Q12H     sodium chloride, acetaminophen **OR** acetaminophen, levalbuterol, ondansetron **OR** ondansetron (ZOFRAN) IV, sodium chloride

## 2014-04-10 NOTE — Progress Notes (Signed)
          Daily Rounding Note  04/10/2014, 11:25 AM  LOS: 2 days   SUBJECTIVE:   No complaints.        OBJECTIVE:         Vital signs in last 24 hours:    Temp:  [97.6 F (36.4 C)-99 F (37.2 C)] 98 F (36.7 C) (01/13 1026) Pulse Rate:  [77-82] 80 (01/13 1026) Resp:  [17-25] 25 (01/13 1026) BP: (119-180)/(60-84) 162/73 mmHg (01/13 1026) SpO2:  [91 %-95 %] 95 % (01/13 1026) Weight:  [112 lb 4.5 oz (50.93 kg)-118 lb 2.7 oz (53.6 kg)] 116 lb 13.5 oz (53 kg) (01/13 1026) Last BM Date: 04/09/13 Filed Weights   04/09/14 2109 04/10/14 0706 04/10/14 1026  Weight: 112 lb 4.5 oz (50.93 kg) 118 lb 2.7 oz (53.6 kg) 116 lb 13.5 oz (53 kg)   General: Looks well.  NAD  Did not reexamine pt Breathing not labored.    Intake/Output from previous day: 01/12 0701 - 01/13 0700 In: 540 [P.O.:540] Out: -   Intake/Output this shift: Total I/O In: 860 [I.V.:100; Blood:760] Out: 838 [Other:838]  Lab Results:  Recent Labs  04/08/14 1448 04/09/14 0232 04/10/14 0700  WBC 6.6 5.7 8.7  HGB 7.4* 7.4* 7.7*  HCT 23.7* 24.1* 24.9*  PLT 285 307 349   BMET  Recent Labs  04/08/14 1207 04/09/14 0232 04/10/14 0700  NA 138 137 132*  K 3.0* 3.4* 3.8  CL 96 94* 93*  CO2 28 32 28  GLUCOSE 104* 84 117*  BUN 14 23 36*  CREATININE 3.14* 4.61* 6.96*  CALCIUM 8.1* 8.5 8.4   LFT  Recent Labs  04/08/14 1207 04/09/14 0232 04/10/14 0700  PROT 7.1 6.1  --   ALBUMIN 3.6 3.2* 3.3*  AST 42* 33  --   ALT 12 10  --   ALKPHOS 130* 112  --   BILITOT 0.6 0.5  --      ASSESMENT:   * FOBT +, normocytic anemia. Symptomatic with DOE. Hx anemia chronic disease. On Aranesp, parenteral iron at HD.  04/09/14 EGD revealed innumerable gastric AVMs which were treated with APC ablation. 3 cm hiatal hernia noted, otherwise EGD normal.  * ESRD. HD MWF  * Hx GERD all sxs controlled with PPI  * Elevated AST and Alk phos, resolved. Not sure  what to make of this in a dialysis pt. No hx of liver disease   PLAN   *  BID PPI for 2 weeks then drop dose to once daily.    *  Check CBC every 2 weeks, if stable then every 4 weeks then if stable could switch to less frequent, perhaps quarterly CBCs.  *  If she develops recurrent drops in her blood counts, should undergo repeat EGD/enteroscopy for investigation and treatment of AVMs.  As she has never undergone colonoscopy, this should also be considered.     Azucena Freed  04/10/2014, 11:25 AM Pager: [phone]  GI Attending Note  I have personally taken an interval history, reviewed the chart, and examined the patient.  I agree with the extender's note, impression and recommendations.  Sandy Salaam. Deatra Ina, MD, [facility] [phone]

## 2014-04-10 NOTE — Progress Notes (Signed)
Late entry due to scheduled downtime: Patient refusing bed alarm this evening shift.  Re-educated patient on importance of utilizing bed alarm; patient still refused.  Non-skid socks on.  Bed in lowest position.  Emphasized use of call bell if needed to use the bathroom; verbalized understanding.  Will continue to monitor patient.

## 2014-04-10 NOTE — Procedures (Signed)
I was present at this dialysis session, have reviewed the session itself and made  appropriate changes  Kelly Splinter MD (pgr) [phone]    [phone] 04/10/2014, 10:49 AM

## 2014-04-10 NOTE — Progress Notes (Signed)
Patient Discharge: Disposition:  Patient discharged home.  Education: Patient educated on medication, prescription, follow-up appointment, diet, and discharge instructions.  Easy to read handout given on Anemia, she understood and acknowledged. IV: Discontinued IV before discharge. Telemetry: Discontinued telemetry before discharge, CCMD notified. Transportation: Patient transported in w/c with friends and staff accompanying. Belongings: Patient took all her belongings with her.

## 2014-04-10 NOTE — Discharge Summary (Signed)
Physician Discharge Summary  [name] MRN: [id number] DOB/AGE: 1931/08/02 81 y.o.  PCP: [name], MD   Admit date: 04/08/2014 Discharge date: 04/10/2014  Discharge Diagnoses:      Anemia   Symptomatic anemia   GI bleed   GAVE (gastric antral vascular ectasia)   Recommendations #1 twice a day by mouth twice a day therapy for 2 weeks then daily #2 iron supplementation #3 follow CBC every 2-4 weeks #4 if stable may DC in a.m.    Medication List    STOP taking these medications        omeprazole 20 MG capsule  Commonly known as:  PRILOSEC      TAKE these medications        amLODipine 5 MG tablet  Commonly known as:  NORVASC  Take 1 tablet (5 mg total) by mouth at bedtime. Daily except do not take on dialysis days (mon,wed and fri)     DSS 100 MG Caps  Take 100 mg by mouth 2 (two) times daily.     pantoprazole 40 MG tablet  Commonly known as:  PROTONIX  Take 1 tablet (40 mg total) by mouth 2 (two) times daily.     REFRESH P.M. OP  Place 1 application into the right eye daily.     RENA-VITE PO  Take 1 tablet by mouth daily with breakfast.     simvastatin 20 MG tablet  Commonly known as:  ZOCOR  TAKE ONE TABLET BY MOUTH AT BEDTIME     TUMS ULTRA 1000 PO  Take 2,000 mg by mouth 3 (three) times daily with meals.     TYLENOL PO  Take 1 tablet by mouth as needed (pain).        Discharge Condition: Stable  Disposition: 01-Home or Self Care   Consults: Gastroenterology  Significant Diagnostic Studies: Dg Shoulder Right  03/12/2014   CLINICAL DATA:  Right shoulder injury. Fall 1.5 weeks ago. Pain. Initial encounter.  EXAM: RIGHT SHOULDER - 2+ VIEW  COMPARISON:  None.  FINDINGS: The acromiohumeral distance has been eliminated, compatible with a chronic supraspinatus rupture.  Mild spurring of the humeral head. Subacromial morphology is type 2 (curved). There is old granulomatous disease in the chest.  No fracture is observed.  IMPRESSION:  1. Elimination of the acromion humeral distance compatible with chronic supraspinatus tendon rupture. 2. Mild degenerative spurring of the right humeral head.   Electronically Signed   By: Sherryl Barters M.D.   On: 03/12/2014 17:22      Microbiology: Recent Results (from the past 240 hour(s))  MRSA PCR Screening     Status: Abnormal   Collection Time: 04/08/14 11:30 PM  Result Value Ref Range Status   MRSA by PCR POSITIVE (A) NEGATIVE Final    Comment:        The GeneXpert MRSA Assay (FDA approved for NASAL specimens only), is one component of a comprehensive MRSA colonization surveillance program. It is not intended to diagnose MRSA infection nor to guide or monitor treatment for MRSA infections. RESULT CALLED TO, READ BACK BY AND VERIFIED WITH: QIU,B RN 0231 04/09/14 MITCHELL,L      Labs: Results for orders placed or performed during the hospital encounter of 04/08/14 (from the past 48 hour(s))  CBC     Status: Abnormal   Collection Time: 04/08/14  2:48 PM  Result Value Ref Range   WBC 6.6 4.0 - 10.5 K/uL   RBC 2.48 (L) 3.87 - 5.11 MIL/uL  Hemoglobin 7.4 (L) 12.0 - 15.0 g/dL   HCT 23.7 (L) 36.0 - 46.0 %   MCV 95.6 78.0 - 100.0 fL   MCH 29.8 26.0 - 34.0 pg   MCHC 31.2 30.0 - 36.0 g/dL   RDW 19.2 (H) 11.5 - 15.5 %   Platelets 285 150 - 400 K/uL  TSH     Status: None   Collection Time: 04/08/14  2:48 PM  Result Value Ref Range   TSH 3.097 0.350 - 4.500 uIU/mL  Troponin I     Status: Abnormal   Collection Time: 04/08/14  2:48 PM  Result Value Ref Range   Troponin I 0.06 (H) <0.031 ng/mL    Comment:        PERSISTENTLY INCREASED TROPONIN VALUES IN THE RANGE OF 0.04-0.49 ng/mL CAN BE SEEN IN:       -UNSTABLE ANGINA       -CONGESTIVE HEART FAILURE       -MYOCARDITIS       -CHEST TRAUMA       -ARRYHTHMIAS       -LATE PRESENTING MYOCARDIAL INFARCTION       -COPD   CLINICAL FOLLOW-UP RECOMMENDED. Please note change in reference range.   Troponin I     Status:  Abnormal   Collection Time: 04/08/14  8:25 PM  Result Value Ref Range   Troponin I 0.06 (H) <0.031 ng/mL    Comment:        PERSISTENTLY INCREASED TROPONIN VALUES IN THE RANGE OF 0.04-0.49 ng/mL CAN BE SEEN IN:       -UNSTABLE ANGINA       -CONGESTIVE HEART FAILURE       -MYOCARDITIS       -CHEST TRAUMA       -ARRYHTHMIAS       -LATE PRESENTING MYOCARDIAL INFARCTION       -COPD   CLINICAL FOLLOW-UP RECOMMENDED. Please note change in reference range.   Glucose, capillary     Status: None   Collection Time: 04/08/14 10:09 PM  Result Value Ref Range   Glucose-Capillary 90 70 - 99 mg/dL  MRSA PCR Screening     Status: Abnormal   Collection Time: 04/08/14 11:30 PM  Result Value Ref Range   MRSA by PCR POSITIVE (A) NEGATIVE    Comment:        The GeneXpert MRSA Assay (FDA approved for NASAL specimens only), is one component of a comprehensive MRSA colonization surveillance program. It is not intended to diagnose MRSA infection nor to guide or monitor treatment for MRSA infections. RESULT CALLED TO, READ BACK BY AND VERIFIED WITH: QIU,B RN 0231 04/09/14 MITCHELL,L   CBC     Status: Abnormal   Collection Time: 04/09/14  2:32 AM  Result Value Ref Range   WBC 5.7 4.0 - 10.5 K/uL   RBC 2.50 (L) 3.87 - 5.11 MIL/uL   Hemoglobin 7.4 (L) 12.0 - 15.0 g/dL   HCT 24.1 (L) 36.0 - 46.0 %   MCV 96.4 78.0 - 100.0 fL   MCH 29.6 26.0 - 34.0 pg   MCHC 30.7 30.0 - 36.0 g/dL   RDW 19.6 (H) 11.5 - 15.5 %   Platelets 307 150 - 400 K/uL  Troponin I     Status: Abnormal   Collection Time: 04/09/14  2:32 AM  Result Value Ref Range   Troponin I 0.06 (H) <0.031 ng/mL    Comment:        PERSISTENTLY INCREASED TROPONIN VALUES IN THE RANGE   OF 0.04-0.49 ng/mL CAN BE SEEN IN:       -UNSTABLE ANGINA       -CONGESTIVE HEART FAILURE       -MYOCARDITIS       -CHEST TRAUMA       -ARRYHTHMIAS       -LATE PRESENTING MYOCARDIAL INFARCTION       -COPD   CLINICAL FOLLOW-UP RECOMMENDED. Please note  change in reference range.   Comprehensive metabolic panel     Status: Abnormal   Collection Time: 04/09/14  2:32 AM  Result Value Ref Range   Sodium 137 135 - 145 mmol/L    Comment: Please note change in reference range.   Potassium 3.4 (L) 3.5 - 5.1 mmol/L    Comment: Please note change in reference range.   Chloride 94 (L) 96 - 112 mEq/L   CO2 32 19 - 32 mmol/L   Glucose, Bld 84 70 - 99 mg/dL   BUN 23 6 - 23 mg/dL   Creatinine, Ser 4.61 (H) 0.50 - 1.10 mg/dL   Calcium 8.5 8.4 - 10.5 mg/dL   Total Protein 6.1 6.0 - 8.3 g/dL   Albumin 3.2 (L) 3.5 - 5.2 g/dL   AST 33 0 - 37 U/L   ALT 10 0 - 35 U/L   Alkaline Phosphatase 112 39 - 117 U/L   Total Bilirubin 0.5 0.3 - 1.2 mg/dL   GFR calc non Af Amer 8 (L) >90 mL/min   GFR calc Af Amer 9 (L) >90 mL/min    Comment: (NOTE) The eGFR has been calculated using the CKD EPI equation. This calculation has not been validated in all clinical situations. eGFR's persistently <90 mL/min signify possible Chronic Kidney Disease.    Anion gap 11 5 - 15  Glucose, capillary     Status: None   Collection Time: 04/09/14  8:59 PM  Result Value Ref Range   Glucose-Capillary 84 70 - 99 mg/dL  CBC     Status: Abnormal   Collection Time: 04/10/14  7:00 AM  Result Value Ref Range   WBC 8.7 4.0 - 10.5 K/uL   RBC 2.60 (L) 3.87 - 5.11 MIL/uL   Hemoglobin 7.7 (L) 12.0 - 15.0 g/dL   HCT 24.9 (L) 36.0 - 46.0 %   MCV 95.8 78.0 - 100.0 fL   MCH 29.6 26.0 - 34.0 pg   MCHC 30.9 30.0 - 36.0 g/dL   RDW 19.1 (H) 11.5 - 15.5 %   Platelets 349 150 - 400 K/uL  Basic metabolic panel     Status: Abnormal   Collection Time: 04/10/14  7:00 AM  Result Value Ref Range   Sodium 132 (L) 135 - 145 mmol/L    Comment: Please note change in reference range.   Potassium 3.8 3.5 - 5.1 mmol/L    Comment: Please note change in reference range.   Chloride 93 (L) 96 - 112 mEq/L   CO2 28 19 - 32 mmol/L   Glucose, Bld 117 (H) 70 - 99 mg/dL   BUN 36 (H) 6 - 23 mg/dL     Comment: DELTA CHECK NOTED   Creatinine, Ser 6.96 (H) 0.50 - 1.10 mg/dL    Comment: DELTA CHECK NOTED   Calcium 8.4 8.4 - 10.5 mg/dL   GFR calc non Af Amer 5 (L) >90 mL/min   GFR calc Af Amer 6 (L) >90 mL/min    Comment: (NOTE) The eGFR has been calculated using the CKD EPI equation. This calculation has not  been validated in all clinical situations. eGFR's persistently <90 mL/min signify possible Chronic Kidney Disease.    Anion gap 11 5 - 15  Albumin     Status: Abnormal   Collection Time: 04/10/14  7:00 AM  Result Value Ref Range   Albumin 3.3 (L) 3.5 - 5.2 g/dL  Phosphorus     Status: Abnormal   Collection Time: 04/10/14  7:00 AM  Result Value Ref Range   Phosphorus 5.2 (H) 2.3 - 4.6 mg/dL  Hepatitis B surface antigen     Status: None   Collection Time: 04/10/14  9:00 AM  Result Value Ref Range   Hepatitis B Surface Ag NEGATIVE NEGATIVE    Comment: Performed at [facility]  Prepare RBC     Status: None   Collection Time: 04/10/14  2:44 PM  Result Value Ref Range   Order Confirmation ORDER PROCESSED BY BLOOD BANK      HPI :[name] is a 81 y.o. female with ESRD (MWF), DM2, anemia of chronic disease who presented from HD to the ED with a Hgb of 7.7. Hgb has declined gradually (12/23) 8.3, (12/30) 7.8, (1/6) 7.2 TSat 20%. Aranesp was increased last week and she was started on iron bolus, she did not submit stool cards. GI was consulted and performed a EGD with control of bleeding and EGD with ablation. No immediate complications. Pt diagnoses with gastric antral vascular ectasia (GAVE) syndrome.    HOSPITAL COURSE:  Normocytic anemia secondary to Patient has GAVE (gastric antral vascular ectasia) syndrome GI recommends twice a day PPI twice a day by mouth twice a day therapy for 2 weeks then daily Tolerating renal diet CBC every 2-4 weeks to be done at PCP office, hemodialysis center Received 2 units during hemodialysis We'll defer iron administration to  renal  End-stage renal disease on hemodialysis Monday Wednesday Friday Completed. Hemodialysis 1/ 13  Type 2 diabetes Continuous sliding scale insulin  Hypertension Continue Norvasc   Discharge Exam:  Blood pressure 162/73, pulse 80, temperature 98 F (36.7 C), temperature source Oral, resp. rate 25, height 4' 11" (1.499 m), weight 53 kg (116 lb 13.5 oz), SpO2 95 %.   General: alert & oriented x 3 In NAD   Cardiovascular: RRR, nl S1 s2   Respiratory: Decreased breath sounds at the bases, scattered rhonchi, no crackles   Abdomen: soft +BS NT/ND, no masses palpable   Extremities: No cyanosis and no edema       Discharge Instructions    Diet - low sodium heart healthy    Complete by:  As directed      Increase activity slowly    Complete by:  As directed            Follow-up Information    Follow up with [name], MD. Schedule an appointment as soon as possible for a visit in 1 week.   Specialty:  Family Medicine   Why:  Follow-up CBC every 4 weeks   Contact information:   [place] [address] [phone]       Follow up with Erskine Emery, MD. Schedule an appointment as soon as possible for a visit in 1 month.   Specialty:  Gastroenterology   Contact information:   [address] [place] [address] [phone]       Signed: Reyne Dumas 04/10/2014, 2:02 PM

## 2014-04-11 LAB — TYPE AND SCREEN
ABO/RH(D): O POS
ANTIBODY SCREEN: NEGATIVE
Unit division: 0
Unit division: 0

## 2014-04-15 ENCOUNTER — Ambulatory Visit (INDEPENDENT_AMBULATORY_CARE_PROVIDER_SITE_OTHER): Payer: Medicare Other | Admitting: Family Medicine

## 2014-04-15 ENCOUNTER — Encounter: Payer: Self-pay | Admitting: Family Medicine

## 2014-04-15 VITALS — BP 132/54 | HR 76 | Temp 97.8°F | Wt 113.5 lb

## 2014-04-15 DIAGNOSIS — D500 Iron deficiency anemia secondary to blood loss (chronic): Secondary | ICD-10-CM

## 2014-04-15 DIAGNOSIS — K31819 Angiodysplasia of stomach and duodenum without bleeding: Secondary | ICD-10-CM

## 2014-04-15 NOTE — Assessment & Plan Note (Addendum)
Due to GAVE s/p EGD/Ablation. Also partly due to CKD. S/p 2u pRBC units last week, due for rpt CBC on Wednesday then will have serial monitoring at HD. Currently stable.

## 2014-04-15 NOTE — Patient Instructions (Signed)
Continue as up to now. Ask dialysis to send me copy of labs for this coming week (Wednesday) Continue protonix 40mg  twice daily. Watch for any chest pain, shortness of breath, dizziness or weakness/fatigue.

## 2014-04-15 NOTE — Progress Notes (Signed)
BP 132/54 mmHg  Pulse 76  Temp(Src) 97.8 F (36.6 C) (Oral)  Wt 113 lb 8 oz (51.483 kg)   CC: hosp f/u visit  Subjective:    Patient ID: [name], female    DOB: [birth year], 81 y.o.   MRN: [id number]  HPI: [name] is a 81 y.o. female presenting on 04/15/2014 for Follow-up   Recent hospitalization 04/08/2014-04/10/2014 for anemia due to GI bleed. EGD revealed multiple gastric AVMs (gastric antral vascular ectasia) treated with APC ablation and 2 units pRBC during HD. rec treatment with oral iron bid x 2 wks then QD, continued aranesp and parenteral iron at HD and checking serial CBCs at dialysis center. Records reviewed. Omeprazole 20mg  daily was changed to pantoprazole 40mg  twice daily.   Admit Hgb 7.7, D/C Hgb 7.7. Planning on serial monitoring at HD center. Has not had Hgb checked yet - planning on rechecking this Wednesday.   ESRD - HD continued MWF.  She was advised to stop oral iron - has been receiving iron infusion at kidney center.  Since home, feeling well.  Denies chest pain, dyspnea, headaches, dyspnea on exertion, dizziness, weakness or fatigue.  Lab Results  Component Value Date   WBC 8.7 04/10/2014   HGB 7.7* 04/10/2014   HCT 24.9* 04/10/2014   MCV 95.8 04/10/2014   PLT 349 04/10/2014    Relevant past medical, surgical, family and social history reviewed and updated as indicated. Interim medical history since our last visit reviewed. Allergies and medications reviewed and updated. Current Outpatient Prescriptions on File Prior to Visit  Medication Sig  . Acetaminophen (TYLENOL PO) Take 1 tablet by mouth as needed (pain).  Marland Kitchen amLODipine (NORVASC) 5 MG tablet Take 1 tablet (5 mg total) by mouth at bedtime. Daily except do not take on dialysis days (mon,wed and fri) (Patient taking differently: Take 5 mg by mouth at bedtime. )  . Artificial Tear Ointment (REFRESH P.M. OP) Place 1 application into the right eye daily.  . B Complex-C-Folic Acid (RENA-VITE  PO) Take 1 tablet by mouth daily with breakfast.   . Calcium Carbonate Antacid (TUMS ULTRA 1000 PO) Take 2,000 mg by mouth 3 (three) times daily with meals.   . pantoprazole (PROTONIX) 40 MG tablet Take 1 tablet (40 mg total) by mouth 2 (two) times daily.  . simvastatin (ZOCOR) 20 MG tablet TAKE ONE TABLET BY MOUTH AT BEDTIME   No current facility-administered medications on file prior to visit.    Review of Systems Per HPI unless specifically indicated above     Objective:    BP 132/54 mmHg  Pulse 76  Temp(Src) 97.8 F (36.6 C) (Oral)  Wt 113 lb 8 oz (51.483 kg)  Wt Readings from Last 3 Encounters:  04/15/14 113 lb 8 oz (51.483 kg)  04/10/14 116 lb 13.5 oz (53 kg)  03/12/14 112 lb (50.803 kg)    Physical Exam  Constitutional: She appears well-developed. No distress.  HENT:  Mouth/Throat: Oropharynx is clear and moist. No oropharyngeal exudate.  Cardiovascular: Normal rate, regular rhythm and intact distal pulses.   Murmur (upper chest hum from LUE access) heard. Pulmonary/Chest: Effort normal and breath sounds normal. No respiratory distress. She has no wheezes. She has no rales.  Musculoskeletal: She exhibits no edema.  Neurological:  Chronic R facial droop  Skin: Skin is warm and dry.  Psychiatric: She has a normal mood and affect.  Nursing note and vitals reviewed.      Assessment & Plan:  Problem List Items Addressed This Visit    GAVE (gastric antral vascular ectasia) - Primary    S/p EGD with ablations. She also received 2 u pRBC during dialysis in hospital. Planned serial CBCs, if persistent drop would consider colonoscopy. Continue protonix 40mg  bid.      Anemia    Due to GAVE s/p EGD/Ablation. Also partly due to CKD. S/p 2u pRBC units last week, due for rpt CBC on Wednesday then will have serial monitoring at HD. Currently stable.          Follow up plan: Return if symptoms worsen or fail to improve, for follow up visit.

## 2014-04-15 NOTE — Progress Notes (Signed)
Pre visit review using our clinic review tool, if applicable. No additional management support is needed unless otherwise documented below in the visit note. 

## 2014-04-15 NOTE — Assessment & Plan Note (Signed)
S/p EGD with ablations. She also received 2 u pRBC during dialysis in hospital. Planned serial CBCs, if persistent drop would consider colonoscopy. Continue protonix 40mg  bid.

## 2014-04-28 DIAGNOSIS — N186 End stage renal disease: Secondary | ICD-10-CM | POA: Diagnosis not present

## 2014-04-28 DIAGNOSIS — Z992 Dependence on renal dialysis: Secondary | ICD-10-CM | POA: Diagnosis not present

## 2014-04-29 DIAGNOSIS — N186 End stage renal disease: Secondary | ICD-10-CM | POA: Diagnosis not present

## 2014-04-29 DIAGNOSIS — E1129 Type 2 diabetes mellitus with other diabetic kidney complication: Secondary | ICD-10-CM | POA: Diagnosis not present

## 2014-04-29 DIAGNOSIS — D509 Iron deficiency anemia, unspecified: Secondary | ICD-10-CM | POA: Diagnosis not present

## 2014-04-29 DIAGNOSIS — D631 Anemia in chronic kidney disease: Secondary | ICD-10-CM | POA: Diagnosis not present

## 2014-04-29 DIAGNOSIS — M25511 Pain in right shoulder: Secondary | ICD-10-CM | POA: Diagnosis not present

## 2014-05-07 ENCOUNTER — Other Ambulatory Visit: Payer: Self-pay | Admitting: Family Medicine

## 2014-05-09 DIAGNOSIS — T888XXD Other specified complications of surgical and medical care, not elsewhere classified, subsequent encounter: Secondary | ICD-10-CM | POA: Diagnosis not present

## 2014-05-09 DIAGNOSIS — E785 Hyperlipidemia, unspecified: Secondary | ICD-10-CM | POA: Diagnosis not present

## 2014-05-09 DIAGNOSIS — Z992 Dependence on renal dialysis: Secondary | ICD-10-CM | POA: Diagnosis not present

## 2014-05-09 DIAGNOSIS — Y841 Kidney dialysis as the cause of abnormal reaction of the patient, or of later complication, without mention of misadventure at the time of the procedure: Secondary | ICD-10-CM | POA: Diagnosis not present

## 2014-05-09 DIAGNOSIS — N186 End stage renal disease: Secondary | ICD-10-CM | POA: Diagnosis not present

## 2014-05-09 DIAGNOSIS — I10 Essential (primary) hypertension: Secondary | ICD-10-CM | POA: Diagnosis not present

## 2014-05-27 DIAGNOSIS — Z992 Dependence on renal dialysis: Secondary | ICD-10-CM | POA: Diagnosis not present

## 2014-05-27 DIAGNOSIS — N186 End stage renal disease: Secondary | ICD-10-CM | POA: Diagnosis not present

## 2014-05-28 DIAGNOSIS — M25511 Pain in right shoulder: Secondary | ICD-10-CM | POA: Diagnosis not present

## 2014-05-29 DIAGNOSIS — N186 End stage renal disease: Secondary | ICD-10-CM | POA: Diagnosis not present

## 2014-05-29 DIAGNOSIS — E1129 Type 2 diabetes mellitus with other diabetic kidney complication: Secondary | ICD-10-CM | POA: Diagnosis not present

## 2014-05-29 DIAGNOSIS — D509 Iron deficiency anemia, unspecified: Secondary | ICD-10-CM | POA: Diagnosis not present

## 2014-06-03 ENCOUNTER — Ambulatory Visit (INDEPENDENT_AMBULATORY_CARE_PROVIDER_SITE_OTHER): Payer: Medicare Other | Admitting: Gastroenterology

## 2014-06-03 ENCOUNTER — Encounter: Payer: Self-pay | Admitting: Gastroenterology

## 2014-06-03 VITALS — BP 156/62 | HR 74 | Ht 59.0 in | Wt 111.5 lb

## 2014-06-03 DIAGNOSIS — N189 Chronic kidney disease, unspecified: Secondary | ICD-10-CM | POA: Diagnosis not present

## 2014-06-03 DIAGNOSIS — K31819 Angiodysplasia of stomach and duodenum without bleeding: Secondary | ICD-10-CM | POA: Diagnosis not present

## 2014-06-03 DIAGNOSIS — N186 End stage renal disease: Secondary | ICD-10-CM | POA: Diagnosis not present

## 2014-06-03 DIAGNOSIS — Z992 Dependence on renal dialysis: Secondary | ICD-10-CM | POA: Diagnosis not present

## 2014-06-03 DIAGNOSIS — D631 Anemia in chronic kidney disease: Secondary | ICD-10-CM

## 2014-06-03 NOTE — Progress Notes (Signed)
      History of Present Illness:  Ms. 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  She was treated with laser ablation and sent home on supplementary iron.  She states that her last hemoglobin was 11.6.  She has no GI complaints.    Review of Systems: Pertinent positive and negative review of systems were noted in the above HPI section. All other review of systems were otherwise negative.    Current Medications, Allergies, Past Medical History, Past Surgical History, Family History and Social History were reviewed in [facility] record  Vital signs were reviewed in today's medical record. Physical Exam: General: Well developed , well nourished, no acute distress   See Assessment and Plan under Problem List

## 2014-06-03 NOTE — Patient Instructions (Signed)
Please follow up with Dr. Kaplan as needed 

## 2014-06-03 NOTE — Assessment & Plan Note (Signed)
Status post laser obliteration with marked improvement in hemoglobin.  No need for repeat therapy unless she cannot maintain her blood counts was supplementary iron.

## 2014-06-12 DIAGNOSIS — E1129 Type 2 diabetes mellitus with other diabetic kidney complication: Secondary | ICD-10-CM | POA: Diagnosis not present

## 2014-06-13 ENCOUNTER — Encounter: Payer: Self-pay | Admitting: Family Medicine

## 2014-06-13 ENCOUNTER — Ambulatory Visit (INDEPENDENT_AMBULATORY_CARE_PROVIDER_SITE_OTHER): Payer: Medicare Other | Admitting: Family Medicine

## 2014-06-13 VITALS — BP 118/50 | HR 68 | Temp 98.1°F | Wt 111.1 lb

## 2014-06-13 DIAGNOSIS — R05 Cough: Secondary | ICD-10-CM

## 2014-06-13 DIAGNOSIS — R059 Cough, unspecified: Secondary | ICD-10-CM

## 2014-06-13 MED ORDER — FLUTICASONE PROPIONATE 50 MCG/ACT NA SUSP: 2.0000 | Freq: Every day | NASAL | Status: DC

## 2014-06-13 MED ORDER — AZITHROMYCIN 250 MG PO TABS: ORAL_TABLET | ORAL | Status: DC

## 2014-06-13 NOTE — Assessment & Plan Note (Signed)
Anticipate allergy flare - rec restart flonase and continue benadryl prn. If not improving with treatment or cough persistent or worsening, provided with WASP for zpack to fill over weekend given comorbidities. Pt agrees with plan.

## 2014-06-13 NOTE — Progress Notes (Signed)
Pre visit review using our clinic review tool, if applicable. No additional management support is needed unless otherwise documented below in the visit note. 

## 2014-06-13 NOTE — Progress Notes (Signed)
BP 118/50 mmHg  Pulse 68  Temp(Src) 98.1 F (36.7 C) (Oral)  Wt 111 lb 1.9 oz (50.404 kg)   CC: congestion x 5 days  Subjective:    Patient ID: [name], female    DOB: [birth year], 81 y.o.   MRN: [id number]  HPI: [name] is a 81 y.o. female presenting on 06/13/2014 for Cough   Yesterday while at dialysis, advised to see PCP for lung congestion.   1 wk h/o itchy watery eyes, rhinorrhea and 5-6d h/o cough. PNdrainage present. Cough not productive. Mildly rattly.  No fevers/chills, ear or tooth pain, headaches, congestion, ST. No wheezing or dyspnea.   Started benadryl which helped allergy sxs. No sick contacts at home. No smokers at home. No h/o asthma/COPD. Uses nasal saline daily.  Relevant past medical, surgical, family and social history reviewed and updated as indicated. Interim medical history since our last visit reviewed. Allergies and medications reviewed and updated. Current Outpatient Prescriptions on File Prior to Visit  Medication Sig  . amLODipine (NORVASC) 5 MG tablet TAKE 1 TABLET (5 MG TOTAL) BY MOUTH AT BEDTIME. DAILY EXCEPT DO NOT TAKE ON DIALYSIS DAYS (MON,WED,&FRI) (Patient taking differently: TAKE 1 TABLET (5 MG TOTAL) BY MOUTH AT BEDTIME.)  . Artificial Tear Ointment (REFRESH P.M. OP) Place 1 application into the right eye daily.  . B Complex-C-Folic Acid (RENA-VITE PO) Take 1 tablet by mouth daily with breakfast.   . Calcium Carbonate Antacid (TUMS ULTRA 1000 PO) Take 2,000 mg by mouth 3 (three) times daily with meals.   . pantoprazole (PROTONIX) 40 MG tablet Take 1 tablet (40 mg total) by mouth 2 (two) times daily.  . simvastatin (ZOCOR) 20 MG tablet TAKE ONE TABLET BY MOUTH AT BEDTIME   No current facility-administered medications on file prior to visit.    Review of Systems Per HPI unless specifically indicated above     Objective:    BP 118/50 mmHg  Pulse 68  Temp(Src) 98.1 F (36.7 C) (Oral)  Wt 111 lb 1.9 oz (50.404 kg)  Wt  Readings from Last 3 Encounters:  06/13/14 111 lb 1.9 oz (50.404 kg)  06/03/14 111 lb 8 oz (50.576 kg)  04/15/14 113 lb 8 oz (51.483 kg)    Physical Exam  Constitutional: She appears well-developed and well-nourished. No distress.  HENT:  Head: Normocephalic and atraumatic.  Right Ear: Tympanic membrane, external ear and ear canal normal.  Left Ear: External ear and ear canal normal.  Nose: Mucosal edema and rhinorrhea present. Right sinus exhibits no maxillary sinus tenderness and no frontal sinus tenderness. Left sinus exhibits no maxillary sinus tenderness and no frontal sinus tenderness.  Mouth/Throat: Uvula is midline, oropharynx is clear and moist and mucous membranes are normal. No oropharyngeal exudate, posterior oropharyngeal edema, posterior oropharyngeal erythema or tonsillar abscesses.  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 Pupils are equal, round, and reactive to light. No scleral icterus.  Neck: Normal range of motion. Neck supple.  Cardiovascular: Normal rate, regular rhythm, normal heart sounds and intact distal pulses.   No murmur heard. Pulmonary/Chest: Effort normal and breath sounds normal. No respiratory distress. She has no wheezes. She has no rales.  Musculoskeletal: She exhibits no edema.  Lymphadenopathy:    She has no cervical adenopathy.  Skin: Skin is warm and dry. No rash noted.  Nursing note and vitals reviewed.     Assessment & Plan:   Problem List Items Addressed  This Visit    Cough - Primary    Anticipate allergy flare - rec restart flonase and continue benadryl prn. If not improving with treatment or cough persistent or worsening, provided with WASP for zpack to fill over weekend given comorbidities. Pt agrees with plan.          Follow up plan: No Follow-up on file.

## 2014-06-13 NOTE — Patient Instructions (Signed)
I do think this is allergy flare and possibly viral upper respiratory infection. Treat with plenty of water, may start flonase nasal steroid (sent to pharmacy). See if with this symptoms improve. If not, fill antibiotic provided today (zpack).

## 2014-06-17 ENCOUNTER — Emergency Department: Payer: Self-pay | Admitting: Emergency Medicine

## 2014-06-17 DIAGNOSIS — S40022A Contusion of left upper arm, initial encounter: Secondary | ICD-10-CM | POA: Diagnosis not present

## 2014-06-17 DIAGNOSIS — M79622 Pain in left upper arm: Secondary | ICD-10-CM | POA: Diagnosis not present

## 2014-06-17 DIAGNOSIS — T8189XA Other complications of procedures, not elsewhere classified, initial encounter: Secondary | ICD-10-CM | POA: Diagnosis not present

## 2014-06-17 DIAGNOSIS — M79602 Pain in left arm: Secondary | ICD-10-CM | POA: Diagnosis not present

## 2014-06-17 DIAGNOSIS — N186 End stage renal disease: Secondary | ICD-10-CM | POA: Diagnosis not present

## 2014-06-17 DIAGNOSIS — S5012XA Contusion of left forearm, initial encounter: Secondary | ICD-10-CM | POA: Diagnosis not present

## 2014-06-17 DIAGNOSIS — I120 Hypertensive chronic kidney disease with stage 5 chronic kidney disease or end stage renal disease: Secondary | ICD-10-CM | POA: Diagnosis not present

## 2014-06-17 DIAGNOSIS — Z992 Dependence on renal dialysis: Secondary | ICD-10-CM | POA: Diagnosis not present

## 2014-06-26 ENCOUNTER — Telehealth: Payer: Self-pay

## 2014-06-26 NOTE — Telephone Encounter (Signed)
Protonix 40 mg daily is adequate

## 2014-06-26 NOTE — Telephone Encounter (Signed)
Confirming the patient is to remain on Protonix 40 mg BID. She is not having any new problems.

## 2014-06-27 ENCOUNTER — Other Ambulatory Visit: Payer: Self-pay

## 2014-06-27 DIAGNOSIS — E1129 Type 2 diabetes mellitus with other diabetic kidney complication: Secondary | ICD-10-CM | POA: Diagnosis not present

## 2014-06-27 DIAGNOSIS — N186 End stage renal disease: Secondary | ICD-10-CM | POA: Diagnosis not present

## 2014-06-27 DIAGNOSIS — Z992 Dependence on renal dialysis: Secondary | ICD-10-CM | POA: Diagnosis not present

## 2014-06-27 MED ORDER — PANTOPRAZOLE SODIUM 40 MG PO TBEC: 40.0000 mg | DELAYED_RELEASE_TABLET | Freq: Every day | ORAL | Status: DC

## 2014-06-28 DIAGNOSIS — D509 Iron deficiency anemia, unspecified: Secondary | ICD-10-CM | POA: Diagnosis not present

## 2014-06-28 DIAGNOSIS — N186 End stage renal disease: Secondary | ICD-10-CM | POA: Diagnosis not present

## 2014-06-28 DIAGNOSIS — D631 Anemia in chronic kidney disease: Secondary | ICD-10-CM | POA: Diagnosis not present

## 2014-06-28 DIAGNOSIS — E1129 Type 2 diabetes mellitus with other diabetic kidney complication: Secondary | ICD-10-CM | POA: Diagnosis not present

## 2014-07-01 DIAGNOSIS — N186 End stage renal disease: Secondary | ICD-10-CM | POA: Diagnosis not present

## 2014-07-01 DIAGNOSIS — D509 Iron deficiency anemia, unspecified: Secondary | ICD-10-CM | POA: Diagnosis not present

## 2014-07-01 DIAGNOSIS — E1129 Type 2 diabetes mellitus with other diabetic kidney complication: Secondary | ICD-10-CM | POA: Diagnosis not present

## 2014-07-01 DIAGNOSIS — D631 Anemia in chronic kidney disease: Secondary | ICD-10-CM | POA: Diagnosis not present

## 2014-07-02 ENCOUNTER — Encounter: Payer: Self-pay | Admitting: Family Medicine

## 2014-07-02 DIAGNOSIS — Z853 Personal history of malignant neoplasm of breast: Secondary | ICD-10-CM | POA: Diagnosis not present

## 2014-07-02 DIAGNOSIS — Z1231 Encounter for screening mammogram for malignant neoplasm of breast: Secondary | ICD-10-CM | POA: Diagnosis not present

## 2014-07-02 LAB — HM MAMMOGRAPHY: HM MAMMO: NORMAL

## 2014-07-03 ENCOUNTER — Encounter: Payer: Self-pay | Admitting: *Deleted

## 2014-07-03 DIAGNOSIS — D509 Iron deficiency anemia, unspecified: Secondary | ICD-10-CM | POA: Diagnosis not present

## 2014-07-03 DIAGNOSIS — I771 Stricture of artery: Secondary | ICD-10-CM | POA: Diagnosis not present

## 2014-07-03 DIAGNOSIS — N186 End stage renal disease: Secondary | ICD-10-CM | POA: Diagnosis not present

## 2014-07-03 DIAGNOSIS — Z992 Dependence on renal dialysis: Secondary | ICD-10-CM | POA: Diagnosis not present

## 2014-07-03 DIAGNOSIS — D631 Anemia in chronic kidney disease: Secondary | ICD-10-CM | POA: Diagnosis not present

## 2014-07-03 DIAGNOSIS — E1129 Type 2 diabetes mellitus with other diabetic kidney complication: Secondary | ICD-10-CM | POA: Diagnosis not present

## 2014-07-03 DIAGNOSIS — T82858D Stenosis of vascular prosthetic devices, implants and grafts, subsequent encounter: Secondary | ICD-10-CM | POA: Diagnosis not present

## 2014-07-04 DIAGNOSIS — N186 End stage renal disease: Secondary | ICD-10-CM | POA: Diagnosis not present

## 2014-07-04 DIAGNOSIS — D509 Iron deficiency anemia, unspecified: Secondary | ICD-10-CM | POA: Diagnosis not present

## 2014-07-04 DIAGNOSIS — D631 Anemia in chronic kidney disease: Secondary | ICD-10-CM | POA: Diagnosis not present

## 2014-07-04 DIAGNOSIS — E1129 Type 2 diabetes mellitus with other diabetic kidney complication: Secondary | ICD-10-CM | POA: Diagnosis not present

## 2014-07-05 DIAGNOSIS — D509 Iron deficiency anemia, unspecified: Secondary | ICD-10-CM | POA: Diagnosis not present

## 2014-07-05 DIAGNOSIS — N186 End stage renal disease: Secondary | ICD-10-CM | POA: Diagnosis not present

## 2014-07-05 DIAGNOSIS — E1129 Type 2 diabetes mellitus with other diabetic kidney complication: Secondary | ICD-10-CM | POA: Diagnosis not present

## 2014-07-05 DIAGNOSIS — D631 Anemia in chronic kidney disease: Secondary | ICD-10-CM | POA: Diagnosis not present

## 2014-07-08 DIAGNOSIS — E1129 Type 2 diabetes mellitus with other diabetic kidney complication: Secondary | ICD-10-CM | POA: Diagnosis not present

## 2014-07-08 DIAGNOSIS — D631 Anemia in chronic kidney disease: Secondary | ICD-10-CM | POA: Diagnosis not present

## 2014-07-08 DIAGNOSIS — N186 End stage renal disease: Secondary | ICD-10-CM | POA: Diagnosis not present

## 2014-07-08 DIAGNOSIS — D509 Iron deficiency anemia, unspecified: Secondary | ICD-10-CM | POA: Diagnosis not present

## 2014-07-10 DIAGNOSIS — E1129 Type 2 diabetes mellitus with other diabetic kidney complication: Secondary | ICD-10-CM | POA: Diagnosis not present

## 2014-07-10 DIAGNOSIS — N186 End stage renal disease: Secondary | ICD-10-CM | POA: Diagnosis not present

## 2014-07-10 DIAGNOSIS — D631 Anemia in chronic kidney disease: Secondary | ICD-10-CM | POA: Diagnosis not present

## 2014-07-10 DIAGNOSIS — D509 Iron deficiency anemia, unspecified: Secondary | ICD-10-CM | POA: Diagnosis not present

## 2014-07-12 DIAGNOSIS — N186 End stage renal disease: Secondary | ICD-10-CM | POA: Diagnosis not present

## 2014-07-12 DIAGNOSIS — D631 Anemia in chronic kidney disease: Secondary | ICD-10-CM | POA: Diagnosis not present

## 2014-07-12 DIAGNOSIS — E1129 Type 2 diabetes mellitus with other diabetic kidney complication: Secondary | ICD-10-CM | POA: Diagnosis not present

## 2014-07-12 DIAGNOSIS — D509 Iron deficiency anemia, unspecified: Secondary | ICD-10-CM | POA: Diagnosis not present

## 2014-07-15 DIAGNOSIS — D631 Anemia in chronic kidney disease: Secondary | ICD-10-CM | POA: Diagnosis not present

## 2014-07-15 DIAGNOSIS — E1129 Type 2 diabetes mellitus with other diabetic kidney complication: Secondary | ICD-10-CM | POA: Diagnosis not present

## 2014-07-15 DIAGNOSIS — N186 End stage renal disease: Secondary | ICD-10-CM | POA: Diagnosis not present

## 2014-07-15 DIAGNOSIS — D509 Iron deficiency anemia, unspecified: Secondary | ICD-10-CM | POA: Diagnosis not present

## 2014-07-17 DIAGNOSIS — D509 Iron deficiency anemia, unspecified: Secondary | ICD-10-CM | POA: Diagnosis not present

## 2014-07-17 DIAGNOSIS — E1129 Type 2 diabetes mellitus with other diabetic kidney complication: Secondary | ICD-10-CM | POA: Diagnosis not present

## 2014-07-17 DIAGNOSIS — N186 End stage renal disease: Secondary | ICD-10-CM | POA: Diagnosis not present

## 2014-07-17 DIAGNOSIS — D631 Anemia in chronic kidney disease: Secondary | ICD-10-CM | POA: Diagnosis not present

## 2014-07-19 DIAGNOSIS — D509 Iron deficiency anemia, unspecified: Secondary | ICD-10-CM | POA: Diagnosis not present

## 2014-07-19 DIAGNOSIS — E1129 Type 2 diabetes mellitus with other diabetic kidney complication: Secondary | ICD-10-CM | POA: Diagnosis not present

## 2014-07-19 DIAGNOSIS — D631 Anemia in chronic kidney disease: Secondary | ICD-10-CM | POA: Diagnosis not present

## 2014-07-19 DIAGNOSIS — N186 End stage renal disease: Secondary | ICD-10-CM | POA: Diagnosis not present

## 2014-07-20 NOTE — Op Note (Signed)
PATIENT NAME:  [name], [name] MR#:  [id number] DATE OF BIRTH:  Dec 26, 1931  DATE OF PROCEDURE:  11/07/2013  PREOPERATIVE DIAGNOSES:  1.  End-stage renal disease.  2.  Failed previous dialysis access.  3.  Hypertension.   POSTOPERATIVE DIAGNOSES: 1.  End-stage renal disease.  2.  Failed previous dialysis access.  3.  Hypertension.   PROCEDURE: Left brachiocephalic arteriovenous fistula creation.   SURGEON: [name], MD   ANESTHESIA: General.   ESTIMATED BLOOD LOSS: 25 mL.   INDICATION FOR PROCEDURE: A 81 year old female with end-stage renal disease. She was referred from an outlying dialysis center for a failed brachiobasilic AV fistula. Attempt at percutaneous salvage of this was unsuccessful. We originally planned graft placement since this was a brachiobasilic AV fistula assuming that the cephalic vein was unusable and she had already had a failed fistula on her right arm as well. Risks and benefits were discussed. Informed consent was obtained.   DESCRIPTION OF PROCEDURE: The patient is brought to the operative suite. After an adequate level of general anesthesia was obtained, the left upper extremity was sterilely prepped and draped and a sterile surgical field was created. A transverse incision was created at the antecubital fossa. I dissected out the brachial artery. At this location, the brachial vein appeared to have been ligated from previous surgery and there was a large patent cephalic vein. They had also gone up to the axillary incision, which would make a brachial artery axillary graft difficult, although not impossible. With a visible large patent cephalic vein, I felt that a native fistula creation would be the most wise option. This was not originally planned because I had assumed the cephalic vein was not usable if the basilic vein was initially used for her dialysis access. I passed dilators up to 4 mm without any difficulty. In the vein, it was ligated distally and marked  for orientation. The artery was encircled with vessel loops proximally and distally, and the patient was systemically heparinized. Control was then pulled up on the vessel loops and an anterior wall arteriotomy was created with an 11 blade and extended with Potts scissors, and the vein was cut and beveled to an appropriate length to match the arteriotomy. The vessels were locally heparinized. An anastomosis was created with a running 6-0 Prolene suture in the usual fashion. The vessel was flushed and de-aired prior to release of control, and on release, 2 single 6-0 Prolene patch sutures were used for hemostasis and hemostasis was achieved. The wound was then irrigated. Surgicel and Evicel topical hemostatic agents were placed. The wound was then closed with 3-0 Vicryl and 4-0 Monocryl. Dermabond was placed as a dressing. The patient was awakened from anesthesia and taken to the recovery room in stable condition having tolerated the procedure well.    ____________________________ [name], MD jsd:TT D: 11/07/2013 13:35:32 ET T: 11/07/2013 14:13:52 ET JOB#: [id number]  cc: [name], MD, <Dictator> [name] MD ELECTRONICALLY SIGNED 11/07/2013 15:35

## 2014-07-21 NOTE — Progress Notes (Signed)
HPI: FU bradycardia. She is dialysis dependent. Holter monitor in April of 2014 showed sinus rhythm to sinus bradycardia with PACs and PVCs. Echocardiogram in May of 2014 showed an ejection fraction of 65-70%, moderate left ventricular hypertrophy, grade 1 diastolic dysfunction and moderate left atrial enlargement. There was trace aortic insufficiency. Nuclear study in May of 2014 showed positive ECG changes but normal perfusion and an ejection fraction of 75%. RV felt to be prominent. Since last seen, the patient denies any dyspnea on exertion, orthopnea, PND, pedal edema, palpitations, syncope or chest pain.   Current Outpatient Prescriptions  Medication Sig Dispense Refill  . amLODipine (NORVASC) 5 MG tablet TAKE 1 TABLET (5 MG TOTAL) BY MOUTH AT BEDTIME. DAILY EXCEPT DO NOT TAKE ON DIALYSIS DAYS (MON,WED,&FRI) (Patient taking differently: TAKE 1 TABLET (5 MG TOTAL) BY MOUTH AT BEDTIME.) 30 tablet 3  . Artificial Tear Ointment (REFRESH P.M. OP) Place 1 application into the right eye daily.    . B Complex-C-Folic Acid (RENA-VITE PO) Take 1 tablet by mouth daily with breakfast.     . Calcium Carbonate Antacid (TUMS ULTRA 1000 PO) Take 2,000 mg by mouth 3 (three) times daily with meals.     . fluticasone (FLONASE) 50 MCG/ACT nasal spray Place 2 sprays into both nostrils daily. 16 g 3  . pantoprazole (PROTONIX) 40 MG tablet Take 1 tablet (40 mg total) by mouth daily. 30 tablet 5  . simvastatin (ZOCOR) 20 MG tablet TAKE ONE TABLET BY MOUTH AT BEDTIME 30 tablet 10   No current facility-administered medications for this visit.     Past Medical History  Diagnosis Date  . Type 2 diabetes with nephropathy     resolved with weight loss  . Hyperlipidemia   . Hypertension   . Anxiety   . Facial droop 1935    acquired during forceps delivery, some residual R visual loss  . Anemia in chronic kidney disease     IV iron infusion  . Hx of breast cancer 2004  . Arthritis     mild in lower  back (Drs Posey Pronto and Joelyn Oms)  . End stage renal disease on dialysis     HD M,W,F [facility] AVF now LUE AVF  . GERD (gastroesophageal reflux disease)   . Cardiac murmur     a. thought due to AVF (2D echo 06/2012 without significant valvular disease).   . Bradycardia     a. Noted 06/2012.  Marland Kitchen Hypotension     a. Associated w/ dialysis.  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 [name])  . Osteoporosis 11/2013    DEXA -3.4 radius    Past Surgical History  Procedure Laterality Date  . Cataract extraction  1981  . Av fistula placement  07/20/10    Right brachiocephalic AVF  . Umbilical hernia repair  2011  . Dexa  2013    [facility]  . US echocardiography  06/2012    LVH, EF 65%, grade 1 diastol dysfunction, mod dliated LAD  . Eye surgery  1966    regular cataract  . Breast lumpectomy Left   . Breast biopsy Left   . Bascilic vein transposition Left 07/24/2013    Procedure: BASILIC VEIN TRANSPOSITION;  Surgeon: [name], MD;  Location: [facility];  Service: Vascular;  Laterality: Left;  . Shuntogram N/A 04/16/2011    Procedure: Earney Mallet;  Surgeon: [name], MD;  Location[facility] C[facility] LAB;  Service:  Cardiovascular;  Laterality: N/A;  . Shuntogram Right 07/05/2013    Procedure: FISTULOGRAM;  Surgeon: [name], MD;  Location: [facility] CATH LAB;  Service: Cardiovascular;  Laterality: Right;  . Esophagogastroduodenoscopy N/A 04/09/2014    Procedure: ESOPHAGOGASTRODUODENOSCOPY (EGD);  Surgeon: [name], MD;  Location: [facility];  Service: Endoscopy;  Laterality: N/A;    History   Social History  . Marital Status: Single    Spouse Name: N/A  . Number of Children: N/A  . Years of Education: N/A   Occupational History  . 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      retired   Social History Main Topics  . Smoking status: Former Smoker -- 2 years    Types: Cigarettes    Quit date: 03/29/1953  . Smokeless  tobacco: Former Systems developer  . Alcohol Use: Yes     Comment: occasional  . Drug Use: No  . Sexual Activity: Not on file   Other Topics Concern  . Not on file   Social History Narrative   As of 03/2013. Lives alone with her small dog.  Bother and sister in law died last year.     Occupation: retired, was Information systems manager for [employer]   Edu: some college    ROS: no fevers or chills, productive cough, hemoptysis, dysphasia, odynophagia, melena, hematochezia, dysuria, hematuria, rash, seizure activity, orthopnea, PND, pedal edema, claudication. Remaining systems are negative.  Physical Exam: Well-developed well-nourished in no acute distress.  Skin is warm and dry.  HEENT right facial droop Neck is supple.  Chest is clear to auscultation with normal expansion.  Cardiovascular exam is regular rate and rhythm.  Abdominal exam nontender or distended. No masses palpated. Extremities show no edema. neuro grossly intact  ECG sinus rhythm at a rate of 82. Left axis deviation. Nonspecific ST changes. Prolonged QT interval.

## 2014-07-22 DIAGNOSIS — D631 Anemia in chronic kidney disease: Secondary | ICD-10-CM | POA: Diagnosis not present

## 2014-07-22 DIAGNOSIS — N186 End stage renal disease: Secondary | ICD-10-CM | POA: Diagnosis not present

## 2014-07-22 DIAGNOSIS — E1129 Type 2 diabetes mellitus with other diabetic kidney complication: Secondary | ICD-10-CM | POA: Diagnosis not present

## 2014-07-22 DIAGNOSIS — D509 Iron deficiency anemia, unspecified: Secondary | ICD-10-CM | POA: Diagnosis not present

## 2014-07-24 ENCOUNTER — Encounter: Payer: Self-pay | Admitting: Cardiology

## 2014-07-24 ENCOUNTER — Ambulatory Visit (INDEPENDENT_AMBULATORY_CARE_PROVIDER_SITE_OTHER): Payer: Medicare Other | Admitting: Cardiology

## 2014-07-24 VITALS — BP 116/52 | HR 82 | Ht 59.0 in | Wt 110.5 lb

## 2014-07-24 DIAGNOSIS — I10 Essential (primary) hypertension: Secondary | ICD-10-CM | POA: Diagnosis not present

## 2014-07-24 DIAGNOSIS — N186 End stage renal disease: Secondary | ICD-10-CM | POA: Diagnosis not present

## 2014-07-24 DIAGNOSIS — D631 Anemia in chronic kidney disease: Secondary | ICD-10-CM | POA: Diagnosis not present

## 2014-07-24 DIAGNOSIS — E785 Hyperlipidemia, unspecified: Secondary | ICD-10-CM | POA: Diagnosis not present

## 2014-07-24 DIAGNOSIS — R001 Bradycardia, unspecified: Secondary | ICD-10-CM

## 2014-07-24 DIAGNOSIS — D509 Iron deficiency anemia, unspecified: Secondary | ICD-10-CM | POA: Diagnosis not present

## 2014-07-24 DIAGNOSIS — E1129 Type 2 diabetes mellitus with other diabetic kidney complication: Secondary | ICD-10-CM | POA: Diagnosis not present

## 2014-07-24 NOTE — Assessment & Plan Note (Signed)
Heart rate 82 today. No recent symptoms of bradycardia. No further evaluation.

## 2014-07-24 NOTE — Assessment & Plan Note (Signed)
Blood pressure controlled. Continue present medications. 

## 2014-07-24 NOTE — Patient Instructions (Signed)
Your physician recommends that you schedule a follow-up appointment in: AS NEEDED  

## 2014-07-24 NOTE — Assessment & Plan Note (Signed)
Continue statin. Followed by primary care.

## 2014-07-26 DIAGNOSIS — D631 Anemia in chronic kidney disease: Secondary | ICD-10-CM | POA: Diagnosis not present

## 2014-07-26 DIAGNOSIS — N186 End stage renal disease: Secondary | ICD-10-CM | POA: Diagnosis not present

## 2014-07-26 DIAGNOSIS — E1129 Type 2 diabetes mellitus with other diabetic kidney complication: Secondary | ICD-10-CM | POA: Diagnosis not present

## 2014-07-26 DIAGNOSIS — D509 Iron deficiency anemia, unspecified: Secondary | ICD-10-CM | POA: Diagnosis not present

## 2014-07-27 DIAGNOSIS — N186 End stage renal disease: Secondary | ICD-10-CM | POA: Diagnosis not present

## 2014-07-27 DIAGNOSIS — E1129 Type 2 diabetes mellitus with other diabetic kidney complication: Secondary | ICD-10-CM | POA: Diagnosis not present

## 2014-07-27 DIAGNOSIS — Z992 Dependence on renal dialysis: Secondary | ICD-10-CM | POA: Diagnosis not present

## 2014-07-28 NOTE — Op Note (Signed)
PATIENT NAME:  [name], [name] MR#:  [id number] DATE OF BIRTH:  06/05/1931  DATE OF PROCEDURE:  04/04/2014  PREOPERATIVE DIAGNOSES: 1.  End-stage renal disease.  2.  Poorly functioning left arm arteriovenous fistula.   POSTOPERATIVE DIAGNOSES: 1.  End-stage renal disease.  2.  Poorly functioning left arm arteriovenous fistula.   PROCEDURES:  1.  Ultrasound guidance for vascular access, left brachiocephalic arteriovenous fistula.  2.  Left upper extremity fistulogram and central venogram.  3.  Percutaneous transluminal angioplasty of mid upper arm cephalic vein and the access site with a 6 mm diameter drug-coated and 8 mm diameter conventional angioplasty balloon.  4.  Coil embolization of a large stealing branch of the cephalic vein with three 8 mm diameter x 5 cm in length coils.   SURGEON: [name], MD    ANESTHESIA: Local with moderate conscious sedation.   ESTIMATED BLOOD LOSS: 25 mL.   INDICATION FOR PROCEDURE: An 81 year old female with end-stage renal disease. Her dialysis access is functioning poorly. She has a large branch stealing flow from the fistula, as well as recurrent stenosis at her access site. She is brought in for treatment. Risks and benefits were discussed and informed consent was obtained.   DESCRIPTION OF THE PROCEDURE: The patient was brought to the vascular suite. The left upper extremity was sterilely prepped and draped and a sterile surgical field was created. The fistula was accessed not far beyond the anastomosis in the cephalic vein under direct ultrasound guidance without difficulty with a micropuncture needle. The micropuncture wire and sheath were placed and a permanent image was recorded then upsized to a 6 French sheath, gave the patient 3000 units of intravenous heparin. Imaging was performed. This showed a high-grade stenosis at the access site in the 80% to 85% range. Just beyond the stenosis was a large branch that was draining nearly as quickly as  the remainder of the cephalic vein. The cephalic vein and the remainder of the upper arm had no significant stenosis and the central venous circulation was patent. I crossed the lesion without difficulty with a Magic torque wire and treated the stenosis initially with a 6 mm diameter Lutonix drug-coated angioplasty balloon and then upsized to an 8 mm diameter high-pressure conventional angioplasty balloon. Narrowing was seen which resolved with angioplasty on both occasions and completion angiogram following this showed markedly improved flow through the fistula with only about a 20% residual stenosis; however, the large stealing branch was still stealing a significant amount of the flow from the fistula out of the cephalic vein. I elected to coil embolize this. I cannulated this branch with a rim catheter without difficulty and advanced well out to the branch. I deployed three 8 mm diameter x 5 cm in length coils in the cephalic vein branch to exclude this from flow and avoid it stealing flow from the fistula. Imaging performed through the sheath showed improved flow through the fistula with occlusion of the branch. At this point, I elected to terminate the procedure. The sheath was removed, 4-0 Monocryl pursestring suture was placed. Pressure was held. Sterile dressing was placed. The patient tolerated the procedure well and was taken to the recovery room in stable condition.    ____________________________ [name], MD jsd:at D: 04/04/2014 08:56:12 ET T: 04/04/2014 13:32:05 ET JOB#: [id number]  cc: [name], MD, <Dictator> [name] MD ELECTRONICALLY SIGNED 04/09/2014 13:39

## 2014-07-29 DIAGNOSIS — D509 Iron deficiency anemia, unspecified: Secondary | ICD-10-CM | POA: Diagnosis not present

## 2014-07-29 DIAGNOSIS — E1129 Type 2 diabetes mellitus with other diabetic kidney complication: Secondary | ICD-10-CM | POA: Diagnosis not present

## 2014-07-29 DIAGNOSIS — D631 Anemia in chronic kidney disease: Secondary | ICD-10-CM | POA: Diagnosis not present

## 2014-07-29 DIAGNOSIS — N186 End stage renal disease: Secondary | ICD-10-CM | POA: Diagnosis not present

## 2014-08-08 DIAGNOSIS — I10 Essential (primary) hypertension: Secondary | ICD-10-CM | POA: Diagnosis not present

## 2014-08-08 DIAGNOSIS — T888XXD Other specified complications of surgical and medical care, not elsewhere classified, subsequent encounter: Secondary | ICD-10-CM | POA: Diagnosis not present

## 2014-08-08 DIAGNOSIS — Y841 Kidney dialysis as the cause of abnormal reaction of the patient, or of later complication, without mention of misadventure at the time of the procedure: Secondary | ICD-10-CM | POA: Diagnosis not present

## 2014-08-08 DIAGNOSIS — E785 Hyperlipidemia, unspecified: Secondary | ICD-10-CM | POA: Diagnosis not present

## 2014-08-08 DIAGNOSIS — Z992 Dependence on renal dialysis: Secondary | ICD-10-CM | POA: Diagnosis not present

## 2014-08-08 DIAGNOSIS — N186 End stage renal disease: Secondary | ICD-10-CM | POA: Diagnosis not present

## 2014-08-15 ENCOUNTER — Encounter
Admission: RE | Disposition: A | Discharge status: Home / Self Care | Payer: Medicare Other | Source: Ambulatory Visit | Attending: Vascular Surgery

## 2014-08-15 ENCOUNTER — Encounter: Payer: Self-pay | Admitting: *Deleted

## 2014-08-15 ENCOUNTER — Ambulatory Visit
Admission: RE | Admit: 2014-08-15 | Discharge: 2014-08-15 | Disposition: A | Discharge status: Home / Self Care | Payer: Medicare Other | Source: Ambulatory Visit | Attending: Vascular Surgery | Admitting: Vascular Surgery

## 2014-08-15 DIAGNOSIS — T82818A Embolism of vascular prosthetic devices, implants and grafts, initial encounter: Secondary | ICD-10-CM | POA: Diagnosis not present

## 2014-08-15 DIAGNOSIS — N186 End stage renal disease: Secondary | ICD-10-CM | POA: Diagnosis not present

## 2014-08-15 DIAGNOSIS — T82858A Stenosis of vascular prosthetic devices, implants and grafts, initial encounter: Secondary | ICD-10-CM | POA: Diagnosis not present

## 2014-08-15 DIAGNOSIS — Z992 Dependence on renal dialysis: Secondary | ICD-10-CM | POA: Insufficient documentation

## 2014-08-15 DIAGNOSIS — Z79899 Other long term (current) drug therapy: Secondary | ICD-10-CM | POA: Diagnosis not present

## 2014-08-15 DIAGNOSIS — Y832 Surgical operation with anastomosis, bypass or graft as the cause of abnormal reaction of the patient, or of later complication, without mention of misadventure at the time of the procedure: Secondary | ICD-10-CM | POA: Insufficient documentation

## 2014-08-15 HISTORY — PX: PERIPHERAL VASCULAR CATHETERIZATION: SHX172C

## 2014-08-15 LAB — POTASSIUM (ARMC VASCULAR LAB ONLY): Potassium (ARMC vascular lab): 4.4

## 2014-08-15 SURGERY — A/V SHUNTOGRAM/FISTULAGRAM
Anesthesia: Moderate Sedation

## 2014-08-15 MED ORDER — IOHEXOL 300 MG/ML  SOLN
INTRAMUSCULAR | Status: DC | PRN
  Administered 2014-08-15: 30 mL

## 2014-08-15 MED ORDER — HYDROMORPHONE HCL 1 MG/ML IJ SOLN: 1.0000 mg | INTRAMUSCULAR | Status: DC | PRN

## 2014-08-15 MED ORDER — FENTANYL CITRATE (PF) 100 MCG/2ML IJ SOLN
INTRAMUSCULAR | Status: DC | PRN
  Administered 2014-08-15: 50 ug via INTRAVENOUS

## 2014-08-15 MED ORDER — MIDAZOLAM HCL 5 MG/5ML IJ SOLN
INTRAMUSCULAR | Status: AC
  Filled 2014-08-15: qty 5

## 2014-08-15 MED ORDER — ATROPINE SULFATE 0.1 MG/ML IJ SOLN: 0.5000 mg | Freq: Once | INTRAMUSCULAR | Status: DC | PRN

## 2014-08-15 MED ORDER — LIDOCAINE HCL (PF) 1 % IJ SOLN
INTRAMUSCULAR | Status: DC | PRN
  Administered 2014-08-15: 10 mL

## 2014-08-15 MED ORDER — LIDOCAINE-EPINEPHRINE (PF) 1 %-1:200000 IJ SOLN
INTRAMUSCULAR | Status: AC
Start: 2014-08-15 — End: 2014-08-15
  Filled 2014-08-15: qty 30

## 2014-08-15 MED ORDER — DEXTROSE 50 % IV SOLN: 0.5000 | Freq: Once | INTRAVENOUS | Status: DC | PRN

## 2014-08-15 MED ORDER — HEPARIN SODIUM (PORCINE) 1000 UNIT/ML IJ SOLN
INTRAMUSCULAR | Status: AC
  Filled 2014-08-15: qty 1

## 2014-08-15 MED ORDER — FENTANYL CITRATE (PF) 100 MCG/2ML IJ SOLN
INTRAMUSCULAR | Status: AC
  Filled 2014-08-15: qty 2

## 2014-08-15 MED ORDER — HEPARIN SODIUM (PORCINE) 1000 UNIT/ML IJ SOLN
INTRAMUSCULAR | Status: DC | PRN
  Administered 2014-08-15: 3000 [IU] via INTRAVENOUS

## 2014-08-15 MED ORDER — HEPARIN (PORCINE) IN NACL 2-0.9 UNIT/ML-% IJ SOLN
INTRAMUSCULAR | Status: AC
  Filled 2014-08-15: qty 1000

## 2014-08-15 MED ORDER — ONDANSETRON HCL 4 MG/2ML IJ SOLN: 4.0000 mg | INTRAMUSCULAR | Status: DC | PRN

## 2014-08-15 MED ORDER — MIDAZOLAM HCL 2 MG/2ML IJ SOLN
INTRAMUSCULAR | Status: DC | PRN
  Administered 2014-08-15: 2 mg via INTRAVENOUS

## 2014-08-15 MED ORDER — CEFAZOLIN SODIUM 1-5 GM-% IV SOLN
1.0000 g | Freq: Once | INTRAVENOUS | Status: AC
  Administered 2014-08-15: 1 g via INTRAVENOUS

## 2014-08-15 MED ORDER — SODIUM CHLORIDE 0.9 % IV SOLN
INTRAVENOUS | Status: DC
  Administered 2014-08-15: 11:00:00 via INTRAVENOUS

## 2014-08-15 SURGICAL SUPPLY — 8 items
BALLN LUTONIX DCB 7X60X130 (BALLOONS) ×3
BALLOON LUTONIX DCB 7X60X130 (BALLOONS) IMPLANT
CANNULA 5F STIFF (CANNULA) ×2 IMPLANT
DEVICE PRESTO INFLATION (MISCELLANEOUS) ×2 IMPLANT
PACK ANGIOGRAPHY (CUSTOM PROCEDURE TRAY) ×2 IMPLANT
SHEATH BRITE TIP 6FRX5.5 (SHEATH) ×2 IMPLANT
TOWEL OR 17X26 4PK STRL BLUE (TOWEL DISPOSABLE) ×2 IMPLANT
WIRE MAGIC TORQUE 260C (WIRE) ×2 IMPLANT

## 2014-08-15 NOTE — H&P (Signed)
[facility] History & Physical Update  The patient was interviewed and re-examined.  The patient's previous History and Physical has been reviewed and is unchanged.  There is no change in the plan of care.  DEW,JASON, MD  08/15/2014, 8:56 AM

## 2014-08-15 NOTE — Discharge Instructions (Signed)
Fistulogram A fistulogram is an X-ray test to look inside the site where your blood is removed and returned to your body during dialysis (dialysis fistula). Your fistula allows easy and effective access, but sometimes problems can occur, such as narrowing or blockages. A narrowing or blockage can reduce the effectiveness of dialysis. A fistulogram helps to detect these problems. It also lets your health care provider know of any additional treatment you may need.  LET [facility] CARE PROVIDER KNOW ABOUT:  Any allergies you have.   All medicines you are taking, including vitamins, herbs, eye drops, creams, and over-the-counter medicines.  Previous problems you or members of your family have had with the use of anesthetics.  Any reactions you have had to contrast dye.  Any blood disorders you have.  Previous surgeries you have had.  Medical conditions you have.  Pain, redness, or swelling in your limb with the dialysis fistula.  Any bleeding from your dialysis fistula.  Any of the following in the part of your body where the dialysis fistula leads:  Numbness.  Tingling.  Cold feeling.  Bluish or pale white in color. RISKS AND COMPLICATIONS Generally, a fistulogram is a safe procedure. However, problems can occur and include:  Bleeding.  Infection.  A blood clot in the dialysis fistula.  A blood clot that travels to your lungs (pulmonary embolism). BEFORE THE PROCEDURE  Your health care provider may do some blood or urine tests or both. These will help your health care provider learn how well your kidneys and liver are working and how well your blood clots.  Ask your health care provider about:  Changing or stopping your regular medicines. This is especially important if you are taking diabetes medicines or blood thinners.  Taking medicines such as aspirin and ibuprofen. These medicines can thin your blood. Do not take these medicines before your procedure if your  health care provider asks you not to.  Do not eat or drink anything after midnight on the night before the procedure or as directed by your health care provider.  Ask your health care provider if you will be able to go home after the procedure. Some people stay overnight in the hospital.  Plan to have someone take you home after the procedure. PROCEDURE   A needle will be inserted into your arm. It will be used to give you medicine. Medicines given may include:  Numbing medicine (local anesthetic) to numb an area on the limb with your fistula.  A medicine to help you relax (sedative).  A medicine that makes you fall asleep (general anesthetic).  When your skin is numb, a needle will be inserted into your vein.  A thin, flexible tube (catheter) will be inserted into the needle and guided to the narrowedarea.  Dye (contrast material) will be injected into the catheter. As the contrast material passes through your body, you may feel warm.  X-rays will be taken. The contrast material will show where anynarrowing or blockages are.  A guide wire and balloon-tipped catheter will be advanced to the blockage site.  The balloon will be inflated for a short period oftime. The balloon may be inflated several times at this site, or the balloon may be moved to other sites.  If your health care provider determines the clot will be best treated by a clot-dissolving medicine (thrombolysis), this medicine can be delivered through the catheter instead of using the balloon.  A mechanical device at the end of the catheter can  also be used to break up the clot (thrombectomy).  At the end of the procedure, the catheter will be removed.  In some cases, the catheter site will be closed with a stitch or two.  Pressure will be applied to stop any bleeding, and your skin will be covered with a bandage (dressing). AFTER THE PROCEDURE  You will stay in a recovery area until the medicines have worn  off.  You will stay in bed for several hours.  As you begin to feel better, you will be offered ice and beverages. You may be given food.  Your health care provider will check your blood pressure and pulse and watch your access site.  When you can walk, drink, eat, and use the bathroom, you may be discharged. Document Released: 07/30/2013 Document Reviewed: 05/04/2013 [facility] Patient Information 2015 [place]. This information is not intended to replace advice given to you by your health care provider. Make sure you discuss any questions you have with your health care provider. The drugs you were given will stay in your system until tomorrow, so for the next 24 hours you should not.  Drive an automobile. Make any legal decisions.  Drink any alcoholic beverages.  Today you should start with liquids and gradually work up to solid foods as your are able to tolerate them  Resume your regular medications as prescribed by your doctor.  Avoid aspirin for 24 hours.    Change the Band-Aid or dressing as needed.  After a 2 days no dressing as needed.  Avoid strenuous activity for the remainder of the day.  Please notify your primary physician immediately if you have any unusual bleeding, trouble breathing, fever >100 degrees or pain not relieved by the medication your doctor prescribed for your doctor prescribed for you physician

## 2014-08-15 NOTE — Op Note (Signed)
[facility]    OPERATIVE NOTE   PROCEDURE: 1.   Left brachiocephalic arteriovenous fistula cannulation under ultrasound guidance 2.   Left arm fistulagram including central venogram 3.   Percutaneous transluminal angioplasty of cephalic vein at cephalic vein subclavian vein confluence with 7 mm diameter Lutonix drug-coated angioplasty balloon  PRE-OPERATIVE DIAGNOSIS: 1. ESRD 2. Poorly functional left brachiocephalic AVF  POST-OPERATIVE DIAGNOSIS: same as above   SURGEON: Jason Dew, MD  ANESTHESIA: local with MCS  ESTIMATED BLOOD LOSS: Minimal  FINDING(S): 1. 70% stenosis of cephalic vein near subclavian vein confluence, improved to less than 20% after angioplasty. 30% stenosis near anastomosis.  SPECIMEN(S):  None  CONTRAST: 30 cc  INDICATIONS: [name] is a 82 y.o. female who presents with malfunctioning  left brachiocephalic arteriovenous fistula and noninvasive studies showing 2 areas of worrisome stenosis..  The patient is scheduled for  left arm fistulagram.  The patient is aware the risks include but are not limited to: bleeding, infection, thrombosis of the cannulated access, and possible anaphylactic reaction to the contrast.  The patient is aware of the risks of the procedure and elects to proceed forward.  DESCRIPTION: After full informed written consent was obtained, the patient was brought back to the angiography suite and placed supine upon the angiography table.  The patient was connected to monitoring equipment.  The  left arm was prepped and draped in the standard fashion for a percutaneous access intervention.  Under ultrasound guidance, the  left brachiocephalic arteriovenous fistula was cannulated with a micropuncture needle under direct ultrasound guidance and a permanent image was performed.  The microwire was advanced into the fistula and the needle was exchanged for the a microsheath.  I then upsized to a 6 Fr Sheath and imaging  was performed.  Hand injections were completed to image the access including the central venous system. This demonstrated about a 70% stenosis of the cephalic vein just before the cephalic vein subclavian vein confluence. Mild to moderate branches that were limiting some blood flow in the fistula were also seen. With compression of the fistula of the perianastomotic region and brachial artery were imaged. About a 30% stenosis was seen from intimal hyperplasia in the cephalic vein just beyond the anastomosis.  Based on the images, this patient will need intervention for her cephalic vein stenosis near the subclavian vein confluence. I then gave the patient 3000 units of intravenous heparin.  I then crossed the stenosis with a Magic Tourqe wire.  Based on the imaging, a 7 mm x 6 cm  Lutonix drug-coated angioplasty balloon was selected.  The balloon was centered around the cephalic vein subclavian vein confluence stenosis and inflated to 14 ATM for 1 minute(s).  On completion imaging, a 15-20 % residual stenosis was present.     Based on the completion imaging, no further intervention is necessary.  The wire and balloon were removed from the sheath.  A 4-0 Monocryl purse-string suture was sewn around the sheath.  The sheath was removed while tying down the suture.  A sterile bandage was applied to the puncture site.  COMPLICATIONS: None  CONDITION: Stable   DEW,JASON  08/15/2014 11:37 AM    

## 2014-08-19 ENCOUNTER — Telehealth: Payer: Self-pay | Admitting: Family Medicine

## 2014-08-27 DIAGNOSIS — Z992 Dependence on renal dialysis: Secondary | ICD-10-CM | POA: Diagnosis not present

## 2014-08-27 DIAGNOSIS — E1129 Type 2 diabetes mellitus with other diabetic kidney complication: Secondary | ICD-10-CM | POA: Diagnosis not present

## 2014-08-27 DIAGNOSIS — N186 End stage renal disease: Secondary | ICD-10-CM | POA: Diagnosis not present

## 2014-08-28 DIAGNOSIS — K31819 Angiodysplasia of stomach and duodenum without bleeding: Secondary | ICD-10-CM

## 2014-08-28 DIAGNOSIS — D631 Anemia in chronic kidney disease: Secondary | ICD-10-CM | POA: Diagnosis not present

## 2014-08-28 DIAGNOSIS — N186 End stage renal disease: Secondary | ICD-10-CM | POA: Diagnosis not present

## 2014-08-28 DIAGNOSIS — E1129 Type 2 diabetes mellitus with other diabetic kidney complication: Secondary | ICD-10-CM | POA: Diagnosis not present

## 2014-08-28 HISTORY — DX: Angiodysplasia of stomach and duodenum without bleeding: K31.819

## 2014-08-30 ENCOUNTER — Other Ambulatory Visit: Payer: Self-pay | Admitting: *Deleted

## 2014-08-30 DIAGNOSIS — E1129 Type 2 diabetes mellitus with other diabetic kidney complication: Secondary | ICD-10-CM | POA: Diagnosis not present

## 2014-08-30 DIAGNOSIS — D631 Anemia in chronic kidney disease: Secondary | ICD-10-CM | POA: Diagnosis not present

## 2014-08-30 DIAGNOSIS — N186 End stage renal disease: Secondary | ICD-10-CM | POA: Diagnosis not present

## 2014-08-30 MED ORDER — AMLODIPINE BESYLATE 5 MG PO TABS: ORAL_TABLET | ORAL | Status: DC

## 2014-08-30 MED ORDER — AMLODIPINE BESYLATE 5 MG PO TABS
ORAL_TABLET | ORAL | Status: DC
Start: 2014-08-30 — End: 2015-04-03

## 2014-08-30 NOTE — Telephone Encounter (Signed)
Sent in

## 2014-08-30 NOTE — Addendum Note (Signed)
Addended by: [name] on: 08/30/2014 02:16 PM   Modules accepted: Orders

## 2014-08-30 NOTE — Telephone Encounter (Signed)
[name] with [employer] left v/m requesting new rx for amlodipine 5 mg; [name] spoke with pt and she takes amlodipine 5 mg daily even on dialysis days. Please advise.

## 2014-09-02 DIAGNOSIS — D631 Anemia in chronic kidney disease: Secondary | ICD-10-CM | POA: Diagnosis not present

## 2014-09-02 DIAGNOSIS — N186 End stage renal disease: Secondary | ICD-10-CM | POA: Diagnosis not present

## 2014-09-02 DIAGNOSIS — E1129 Type 2 diabetes mellitus with other diabetic kidney complication: Secondary | ICD-10-CM | POA: Diagnosis not present

## 2014-09-04 DIAGNOSIS — D631 Anemia in chronic kidney disease: Secondary | ICD-10-CM | POA: Diagnosis not present

## 2014-09-04 DIAGNOSIS — E1129 Type 2 diabetes mellitus with other diabetic kidney complication: Secondary | ICD-10-CM | POA: Diagnosis not present

## 2014-09-04 DIAGNOSIS — N186 End stage renal disease: Secondary | ICD-10-CM | POA: Diagnosis not present

## 2014-09-06 DIAGNOSIS — N186 End stage renal disease: Secondary | ICD-10-CM | POA: Diagnosis not present

## 2014-09-06 DIAGNOSIS — D631 Anemia in chronic kidney disease: Secondary | ICD-10-CM | POA: Diagnosis not present

## 2014-09-06 DIAGNOSIS — E1129 Type 2 diabetes mellitus with other diabetic kidney complication: Secondary | ICD-10-CM | POA: Diagnosis not present

## 2014-09-09 DIAGNOSIS — E1129 Type 2 diabetes mellitus with other diabetic kidney complication: Secondary | ICD-10-CM | POA: Diagnosis not present

## 2014-09-09 DIAGNOSIS — D631 Anemia in chronic kidney disease: Secondary | ICD-10-CM | POA: Diagnosis not present

## 2014-09-09 DIAGNOSIS — N186 End stage renal disease: Secondary | ICD-10-CM | POA: Diagnosis not present

## 2014-09-10 DIAGNOSIS — T82818A Embolism of vascular prosthetic devices, implants and grafts, initial encounter: Secondary | ICD-10-CM | POA: Diagnosis not present

## 2014-09-10 DIAGNOSIS — Y841 Kidney dialysis as the cause of abnormal reaction of the patient, or of later complication, without mention of misadventure at the time of the procedure: Secondary | ICD-10-CM | POA: Diagnosis not present

## 2014-09-10 DIAGNOSIS — E785 Hyperlipidemia, unspecified: Secondary | ICD-10-CM | POA: Diagnosis not present

## 2014-09-10 DIAGNOSIS — Z992 Dependence on renal dialysis: Secondary | ICD-10-CM | POA: Diagnosis not present

## 2014-09-10 DIAGNOSIS — N186 End stage renal disease: Secondary | ICD-10-CM | POA: Diagnosis not present

## 2014-09-10 DIAGNOSIS — I10 Essential (primary) hypertension: Secondary | ICD-10-CM | POA: Diagnosis not present

## 2014-09-11 DIAGNOSIS — N186 End stage renal disease: Secondary | ICD-10-CM | POA: Diagnosis not present

## 2014-09-11 DIAGNOSIS — D631 Anemia in chronic kidney disease: Secondary | ICD-10-CM | POA: Diagnosis not present

## 2014-09-11 DIAGNOSIS — E1129 Type 2 diabetes mellitus with other diabetic kidney complication: Secondary | ICD-10-CM | POA: Diagnosis not present

## 2014-09-13 DIAGNOSIS — N186 End stage renal disease: Secondary | ICD-10-CM | POA: Diagnosis not present

## 2014-09-13 DIAGNOSIS — E1129 Type 2 diabetes mellitus with other diabetic kidney complication: Secondary | ICD-10-CM | POA: Diagnosis not present

## 2014-09-13 DIAGNOSIS — D631 Anemia in chronic kidney disease: Secondary | ICD-10-CM | POA: Diagnosis not present

## 2014-09-17 DIAGNOSIS — D631 Anemia in chronic kidney disease: Secondary | ICD-10-CM | POA: Diagnosis not present

## 2014-09-17 DIAGNOSIS — N186 End stage renal disease: Secondary | ICD-10-CM | POA: Diagnosis not present

## 2014-09-17 DIAGNOSIS — E1129 Type 2 diabetes mellitus with other diabetic kidney complication: Secondary | ICD-10-CM | POA: Diagnosis not present

## 2014-09-18 DIAGNOSIS — E1129 Type 2 diabetes mellitus with other diabetic kidney complication: Secondary | ICD-10-CM | POA: Diagnosis not present

## 2014-09-18 DIAGNOSIS — N186 End stage renal disease: Secondary | ICD-10-CM | POA: Diagnosis not present

## 2014-09-18 DIAGNOSIS — D631 Anemia in chronic kidney disease: Secondary | ICD-10-CM | POA: Diagnosis not present

## 2014-09-20 DIAGNOSIS — N186 End stage renal disease: Secondary | ICD-10-CM | POA: Diagnosis not present

## 2014-09-20 DIAGNOSIS — E1129 Type 2 diabetes mellitus with other diabetic kidney complication: Secondary | ICD-10-CM | POA: Diagnosis not present

## 2014-09-20 DIAGNOSIS — D631 Anemia in chronic kidney disease: Secondary | ICD-10-CM | POA: Diagnosis not present

## 2014-09-22 ENCOUNTER — Inpatient Hospital Stay (HOSPITAL_COMMUNITY)
Admission: EM | Admit: 2014-09-22 | Discharge: 2014-09-24 | DRG: 377 | Disposition: A | Discharge status: Home / Self Care | Payer: Medicare Other | Attending: Internal Medicine | Admitting: Internal Medicine

## 2014-09-22 ENCOUNTER — Emergency Department (HOSPITAL_COMMUNITY): Payer: Medicare Other

## 2014-09-22 DIAGNOSIS — N186 End stage renal disease: Secondary | ICD-10-CM | POA: Diagnosis present

## 2014-09-22 DIAGNOSIS — F419 Anxiety disorder, unspecified: Secondary | ICD-10-CM | POA: Diagnosis present

## 2014-09-22 DIAGNOSIS — Z881 Allergy status to other antibiotic agents status: Secondary | ICD-10-CM

## 2014-09-22 DIAGNOSIS — I10 Essential (primary) hypertension: Secondary | ICD-10-CM | POA: Diagnosis present

## 2014-09-22 DIAGNOSIS — Z87891 Personal history of nicotine dependence: Secondary | ICD-10-CM | POA: Diagnosis not present

## 2014-09-22 DIAGNOSIS — M810 Age-related osteoporosis without current pathological fracture: Secondary | ICD-10-CM | POA: Diagnosis present

## 2014-09-22 DIAGNOSIS — I5033 Acute on chronic diastolic (congestive) heart failure: Secondary | ICD-10-CM | POA: Diagnosis present

## 2014-09-22 DIAGNOSIS — R9431 Abnormal electrocardiogram [ECG] [EKG]: Secondary | ICD-10-CM

## 2014-09-22 DIAGNOSIS — E8770 Fluid overload, unspecified: Secondary | ICD-10-CM | POA: Diagnosis not present

## 2014-09-22 DIAGNOSIS — I214 Non-ST elevation (NSTEMI) myocardial infarction: Secondary | ICD-10-CM | POA: Insufficient documentation

## 2014-09-22 DIAGNOSIS — Z79899 Other long term (current) drug therapy: Secondary | ICD-10-CM

## 2014-09-22 DIAGNOSIS — I120 Hypertensive chronic kidney disease with stage 5 chronic kidney disease or end stage renal disease: Secondary | ICD-10-CM | POA: Diagnosis present

## 2014-09-22 DIAGNOSIS — E875 Hyperkalemia: Secondary | ICD-10-CM | POA: Diagnosis present

## 2014-09-22 DIAGNOSIS — R0600 Dyspnea, unspecified: Secondary | ICD-10-CM | POA: Diagnosis not present

## 2014-09-22 DIAGNOSIS — K31811 Angiodysplasia of stomach and duodenum with bleeding: Secondary | ICD-10-CM | POA: Diagnosis not present

## 2014-09-22 DIAGNOSIS — I248 Other forms of acute ischemic heart disease: Secondary | ICD-10-CM | POA: Diagnosis not present

## 2014-09-22 DIAGNOSIS — D500 Iron deficiency anemia secondary to blood loss (chronic): Secondary | ICD-10-CM | POA: Diagnosis present

## 2014-09-22 DIAGNOSIS — D649 Anemia, unspecified: Secondary | ICD-10-CM | POA: Diagnosis present

## 2014-09-22 DIAGNOSIS — R0609 Other forms of dyspnea: Secondary | ICD-10-CM

## 2014-09-22 DIAGNOSIS — J810 Acute pulmonary edema: Secondary | ICD-10-CM | POA: Diagnosis not present

## 2014-09-22 DIAGNOSIS — E785 Hyperlipidemia, unspecified: Secondary | ICD-10-CM | POA: Diagnosis present

## 2014-09-22 DIAGNOSIS — E119 Type 2 diabetes mellitus without complications: Secondary | ICD-10-CM | POA: Diagnosis present

## 2014-09-22 DIAGNOSIS — K219 Gastro-esophageal reflux disease without esophagitis: Secondary | ICD-10-CM | POA: Diagnosis present

## 2014-09-22 DIAGNOSIS — D631 Anemia in chronic kidney disease: Secondary | ICD-10-CM | POA: Diagnosis present

## 2014-09-22 DIAGNOSIS — J9601 Acute respiratory failure with hypoxia: Secondary | ICD-10-CM | POA: Diagnosis present

## 2014-09-22 DIAGNOSIS — R0602 Shortness of breath: Secondary | ICD-10-CM | POA: Diagnosis not present

## 2014-09-22 DIAGNOSIS — Z992 Dependence on renal dialysis: Secondary | ICD-10-CM

## 2014-09-22 DIAGNOSIS — Z853 Personal history of malignant neoplasm of breast: Secondary | ICD-10-CM | POA: Diagnosis not present

## 2014-09-22 DIAGNOSIS — Z8639 Personal history of other endocrine, nutritional and metabolic disease: Secondary | ICD-10-CM

## 2014-09-22 MED ORDER — ASPIRIN 325 MG PO TABS
325.0000 mg | ORAL_TABLET | Freq: Once | ORAL | Status: AC
  Administered 2014-09-23: 325 mg via ORAL
  Filled 2014-09-22: qty 1

## 2014-09-22 NOTE — ED Notes (Signed)
Per EMS pt stated she woke up not feeling well throughout the day; Pt c/o SOB with exertion that has increased throughout the day; Pt has clear lungs sound bilaterally; denies pain, n/v on admission; pt has diayslis MWF; Hx of HTN, GI bleed; Pt from home alone

## 2014-09-23 ENCOUNTER — Encounter (HOSPITAL_COMMUNITY): Payer: Self-pay | Admitting: General Practice

## 2014-09-23 ENCOUNTER — Encounter (HOSPITAL_COMMUNITY)
Admission: EM | Disposition: A | Discharge status: Home / Self Care | Payer: Medicare Other | Source: Home / Self Care | Attending: Internal Medicine

## 2014-09-23 DIAGNOSIS — J810 Acute pulmonary edema: Secondary | ICD-10-CM

## 2014-09-23 DIAGNOSIS — I5033 Acute on chronic diastolic (congestive) heart failure: Secondary | ICD-10-CM | POA: Diagnosis present

## 2014-09-23 DIAGNOSIS — M810 Age-related osteoporosis without current pathological fracture: Secondary | ICD-10-CM | POA: Diagnosis present

## 2014-09-23 DIAGNOSIS — I150 Renovascular hypertension: Secondary | ICD-10-CM

## 2014-09-23 DIAGNOSIS — I248 Other forms of acute ischemic heart disease: Secondary | ICD-10-CM | POA: Diagnosis not present

## 2014-09-23 DIAGNOSIS — R9431 Abnormal electrocardiogram [ECG] [EKG]: Secondary | ICD-10-CM

## 2014-09-23 DIAGNOSIS — Z87891 Personal history of nicotine dependence: Secondary | ICD-10-CM | POA: Diagnosis not present

## 2014-09-23 DIAGNOSIS — N186 End stage renal disease: Secondary | ICD-10-CM | POA: Diagnosis not present

## 2014-09-23 DIAGNOSIS — J9601 Acute respiratory failure with hypoxia: Secondary | ICD-10-CM | POA: Diagnosis not present

## 2014-09-23 DIAGNOSIS — Z992 Dependence on renal dialysis: Secondary | ICD-10-CM | POA: Diagnosis not present

## 2014-09-23 DIAGNOSIS — K31811 Angiodysplasia of stomach and duodenum with bleeding: Principal | ICD-10-CM

## 2014-09-23 DIAGNOSIS — R0609 Other forms of dyspnea: Secondary | ICD-10-CM

## 2014-09-23 DIAGNOSIS — R0600 Dyspnea, unspecified: Secondary | ICD-10-CM | POA: Diagnosis not present

## 2014-09-23 DIAGNOSIS — Z8639 Personal history of other endocrine, nutritional and metabolic disease: Secondary | ICD-10-CM

## 2014-09-23 DIAGNOSIS — D500 Iron deficiency anemia secondary to blood loss (chronic): Secondary | ICD-10-CM | POA: Diagnosis present

## 2014-09-23 DIAGNOSIS — E8770 Fluid overload, unspecified: Secondary | ICD-10-CM | POA: Diagnosis not present

## 2014-09-23 DIAGNOSIS — Z79899 Other long term (current) drug therapy: Secondary | ICD-10-CM | POA: Diagnosis not present

## 2014-09-23 DIAGNOSIS — E875 Hyperkalemia: Secondary | ICD-10-CM

## 2014-09-23 DIAGNOSIS — K219 Gastro-esophageal reflux disease without esophagitis: Secondary | ICD-10-CM | POA: Diagnosis present

## 2014-09-23 DIAGNOSIS — D649 Anemia, unspecified: Secondary | ICD-10-CM | POA: Diagnosis not present

## 2014-09-23 DIAGNOSIS — E119 Type 2 diabetes mellitus without complications: Secondary | ICD-10-CM | POA: Diagnosis present

## 2014-09-23 DIAGNOSIS — D631 Anemia in chronic kidney disease: Secondary | ICD-10-CM | POA: Diagnosis present

## 2014-09-23 DIAGNOSIS — I120 Hypertensive chronic kidney disease with stage 5 chronic kidney disease or end stage renal disease: Secondary | ICD-10-CM | POA: Diagnosis not present

## 2014-09-23 DIAGNOSIS — I214 Non-ST elevation (NSTEMI) myocardial infarction: Secondary | ICD-10-CM | POA: Insufficient documentation

## 2014-09-23 DIAGNOSIS — Z881 Allergy status to other antibiotic agents status: Secondary | ICD-10-CM | POA: Diagnosis not present

## 2014-09-23 DIAGNOSIS — F419 Anxiety disorder, unspecified: Secondary | ICD-10-CM | POA: Diagnosis present

## 2014-09-23 DIAGNOSIS — Z853 Personal history of malignant neoplasm of breast: Secondary | ICD-10-CM | POA: Diagnosis not present

## 2014-09-23 DIAGNOSIS — E785 Hyperlipidemia, unspecified: Secondary | ICD-10-CM | POA: Diagnosis present

## 2014-09-23 HISTORY — PX: ESOPHAGOGASTRODUODENOSCOPY: SHX5428

## 2014-09-23 LAB — TROPONIN I
TROPONIN I: 0.13 ng/mL — AB (ref <0.031)
TROPONIN I: 0.21 ng/mL — AB (ref <0.031)
TROPONIN I: 1.34 ng/mL — AB (ref <0.031)
Troponin I: 1.26 ng/mL (ref <0.031)

## 2014-09-23 LAB — RENAL FUNCTION PANEL
Albumin: 3.3 g/dL — ABNORMAL LOW (ref 3.5–5.0)
Anion gap: 13 (ref 5–15)
BUN: 87 mg/dL — ABNORMAL HIGH (ref 6–20)
CO2: 25 mmol/L (ref 22–32)
Calcium: 8.2 mg/dL — ABNORMAL LOW (ref 8.9–10.3)
Chloride: 100 mmol/L — ABNORMAL LOW (ref 101–111)
Creatinine, Ser: 7.47 mg/dL — ABNORMAL HIGH (ref 0.44–1.00)
GFR calc Af Amer: 5 mL/min — ABNORMAL LOW (ref >60)
GFR calc non Af Amer: 5 mL/min — ABNORMAL LOW (ref >60)
Glucose, Bld: 137 mg/dL — ABNORMAL HIGH (ref 65–99)
Phosphorus: 5.1 mg/dL — ABNORMAL HIGH (ref 2.5–4.6)
Potassium: 5.6 mmol/L — ABNORMAL HIGH (ref 3.5–5.1)
Sodium: 138 mmol/L (ref 135–145)

## 2014-09-23 LAB — CBC
HCT: 17.6 % — ABNORMAL LOW (ref 36.0–46.0)
HCT: 25.3 % — ABNORMAL LOW (ref 36.0–46.0)
Hemoglobin: 5.6 g/dL — CL (ref 12.0–15.0)
Hemoglobin: 8.1 g/dL — ABNORMAL LOW (ref 12.0–15.0)
MCH: 29.2 pg (ref 26.0–34.0)
MCH: 28.3 pg (ref 26.0–34.0)
MCHC: 31.8 g/dL (ref 30.0–36.0)
MCHC: 32 g/dL (ref 30.0–36.0)
MCV: 91.7 fL (ref 78.0–100.0)
MCV: 88.5 fL (ref 78.0–100.0)
PLATELETS: 287 10*3/uL (ref 150–400)
Platelets: 272 10*3/uL (ref 150–400)
RBC: 1.92 MIL/uL — ABNORMAL LOW (ref 3.87–5.11)
RBC: 2.86 MIL/uL — ABNORMAL LOW (ref 3.87–5.11)
RDW: 17.4 % — ABNORMAL HIGH (ref 11.5–15.5)
RDW: 16.7 % — ABNORMAL HIGH (ref 11.5–15.5)
WBC: 8.2 10*3/uL (ref 4.0–10.5)
WBC: 7.2 10*3/uL (ref 4.0–10.5)

## 2014-09-23 LAB — CBC WITH DIFFERENTIAL/PLATELET
BASOS PCT: 1 % (ref 0–1)
Basophils Absolute: 0 10*3/uL (ref 0.0–0.1)
Eosinophils Absolute: 0.2 10*3/uL (ref 0.0–0.7)
Eosinophils Relative: 2 % (ref 0–5)
HCT: 18.7 % — ABNORMAL LOW (ref 36.0–46.0)
HEMOGLOBIN: 5.9 g/dL — AB (ref 12.0–15.0)
Lymphocytes Relative: 21 % (ref 12–46)
Lymphs Abs: 1.5 10*3/uL (ref 0.7–4.0)
MCH: 28.6 pg (ref 26.0–34.0)
MCHC: 31.6 g/dL (ref 30.0–36.0)
MCV: 90.8 fL (ref 78.0–100.0)
Monocytes Absolute: 0.6 10*3/uL (ref 0.1–1.0)
Monocytes Relative: 8 % (ref 3–12)
NEUTROS PCT: 69 % (ref 43–77)
Neutro Abs: 5.1 10*3/uL (ref 1.7–7.7)
PLATELETS: 301 10*3/uL (ref 150–400)
RBC: 2.06 MIL/uL — AB (ref 3.87–5.11)
RDW: 17.8 % — ABNORMAL HIGH (ref 11.5–15.5)
WBC: 7.4 10*3/uL (ref 4.0–10.5)

## 2014-09-23 LAB — BASIC METABOLIC PANEL
Anion gap: 13 (ref 5–15)
Anion gap: 16 — ABNORMAL HIGH (ref 5–15)
BUN: 83 mg/dL — AB (ref 6–20)
BUN: 76 mg/dL — AB (ref 6–20)
CALCIUM: 8.2 mg/dL — AB (ref 8.9–10.3)
CO2: 25 mmol/L (ref 22–32)
CO2: 26 mmol/L (ref 22–32)
CREATININE: 7.5 mg/dL — AB (ref 0.44–1.00)
Calcium: 7.8 mg/dL — ABNORMAL LOW (ref 8.9–10.3)
Chloride: 96 mmol/L — ABNORMAL LOW (ref 101–111)
Chloride: 99 mmol/L — ABNORMAL LOW (ref 101–111)
Creatinine, Ser: 7.11 mg/dL — ABNORMAL HIGH (ref 0.44–1.00)
GFR calc Af Amer: 5 mL/min — ABNORMAL LOW (ref >60)
GFR calc Af Amer: 6 mL/min — ABNORMAL LOW (ref >60)
GFR, EST NON AFRICAN AMERICAN: 5 mL/min — AB (ref >60)
GFR, EST NON AFRICAN AMERICAN: 5 mL/min — AB (ref >60)
GLUCOSE: 154 mg/dL — AB (ref 65–99)
Glucose, Bld: 150 mg/dL — ABNORMAL HIGH (ref 65–99)
Potassium: 5.9 mmol/L — ABNORMAL HIGH (ref 3.5–5.1)
Potassium: 6 mmol/L — ABNORMAL HIGH (ref 3.5–5.1)
SODIUM: 138 mmol/L (ref 135–145)
Sodium: 137 mmol/L (ref 135–145)

## 2014-09-23 LAB — GLUCOSE, CAPILLARY
Glucose-Capillary: 93 mg/dL (ref 65–99)
Glucose-Capillary: 95 mg/dL (ref 65–99)
Glucose-Capillary: 127 mg/dL — ABNORMAL HIGH (ref 65–99)

## 2014-09-23 LAB — MRSA PCR SCREENING: MRSA BY PCR: NEGATIVE

## 2014-09-23 LAB — PROTIME-INR
INR: 1.02 (ref 0.00–1.49)
Prothrombin Time: 13.6 seconds (ref 11.6–15.2)

## 2014-09-23 LAB — I-STAT TROPONIN, ED: TROPONIN I, POC: 0.12 ng/mL — AB (ref 0.00–0.08)

## 2014-09-23 LAB — PREPARE RBC (CROSSMATCH)

## 2014-09-23 LAB — CBG MONITORING, ED
Glucose-Capillary: 151 mg/dL — ABNORMAL HIGH (ref 65–99)
Glucose-Capillary: 144 mg/dL — ABNORMAL HIGH (ref 65–99)

## 2014-09-23 LAB — POC OCCULT BLOOD, ED: Fecal Occult Bld: POSITIVE — AB

## 2014-09-23 SURGERY — EGD (ESOPHAGOGASTRODUODENOSCOPY)
Anesthesia: Moderate Sedation

## 2014-09-23 MED ORDER — HYDROMORPHONE HCL 1 MG/ML IJ SOLN: 0.5000 mg | INTRAMUSCULAR | Status: DC | PRN

## 2014-09-23 MED ORDER — SODIUM CHLORIDE 0.9 % IV SOLN
62.5000 mg | INTRAVENOUS | Status: DC
  Administered 2014-09-23: 62.5 mg via INTRAVENOUS
  Filled 2014-09-23: qty 5

## 2014-09-23 MED ORDER — CALCIUM CARBONATE ANTACID 500 MG PO CHEW
2000.0000 mg | CHEWABLE_TABLET | Freq: Three times a day (TID) | ORAL | Status: DC
  Administered 2014-09-24 (×2): 2000 mg via ORAL
  Filled 2014-09-23 (×6): qty 4

## 2014-09-23 MED ORDER — HEPARIN SODIUM (PORCINE) 1000 UNIT/ML DIALYSIS: 1000.0000 [IU] | INTRAMUSCULAR | Status: DC | PRN

## 2014-09-23 MED ORDER — GLYCOPYRROLATE 0.2 MG/ML IJ SOLN
INTRAMUSCULAR | Status: DC | PRN
Start: 2014-09-23 — End: 2014-09-23
  Administered 2014-09-23: 0.2 mg via INTRAVENOUS

## 2014-09-23 MED ORDER — BUTAMBEN-TETRACAINE-BENZOCAINE 2-2-14 % EX AERO
INHALATION_SPRAY | CUTANEOUS | Status: DC | PRN
  Administered 2014-09-23: 2 via TOPICAL

## 2014-09-23 MED ORDER — FENTANYL CITRATE (PF) 100 MCG/2ML IJ SOLN
INTRAMUSCULAR | Status: DC | PRN
  Administered 2014-09-23 (×2): 25 ug via INTRAVENOUS

## 2014-09-23 MED ORDER — SODIUM CHLORIDE 0.9 % IV SOLN
INTRAVENOUS | Status: DC
  Administered 2014-09-23: 500 mL via INTRAVENOUS

## 2014-09-23 MED ORDER — FLUTICASONE PROPIONATE 50 MCG/ACT NA SUSP
2.0000 | Freq: Every day | NASAL | Status: DC | PRN
  Filled 2014-09-23: qty 16

## 2014-09-23 MED ORDER — SIMVASTATIN 20 MG PO TABS
20.0000 mg | ORAL_TABLET | Freq: Every day | ORAL | Status: DC
  Administered 2014-09-23: 20 mg via ORAL
  Filled 2014-09-23 (×2): qty 1

## 2014-09-23 MED ORDER — NITROGLYCERIN 2 % TD OINT
1.0000 [in_us] | TOPICAL_OINTMENT | Freq: Once | TRANSDERMAL | Status: DC
Start: 2014-09-23 — End: 2014-09-23

## 2014-09-23 MED ORDER — PANTOPRAZOLE SODIUM 40 MG PO TBEC: 40.0000 mg | DELAYED_RELEASE_TABLET | Freq: Every day | ORAL | Status: DC

## 2014-09-23 MED ORDER — MIDAZOLAM HCL 5 MG/5ML IJ SOLN
INTRAMUSCULAR | Status: DC | PRN
  Administered 2014-09-23 (×2): 2 mg via INTRAVENOUS

## 2014-09-23 MED ORDER — ACETAMINOPHEN 325 MG PO TABS: 650.0000 mg | ORAL_TABLET | Freq: Four times a day (QID) | ORAL | Status: DC | PRN

## 2014-09-23 MED ORDER — SODIUM CHLORIDE 0.9 % IV SOLN: 125.0000 mg | INTRAVENOUS | Status: DC

## 2014-09-23 MED ORDER — ONDANSETRON HCL 4 MG/2ML IJ SOLN: 4.0000 mg | Freq: Four times a day (QID) | INTRAMUSCULAR | Status: DC | PRN

## 2014-09-23 MED ORDER — GLYCOPYRROLATE 0.2 MG/ML IJ SOLN
INTRAMUSCULAR | Status: AC
  Filled 2014-09-23: qty 1

## 2014-09-23 MED ORDER — POLYVINYL ALCOHOL 1.4 % OP SOLN
Freq: Every day | OPHTHALMIC | Status: DC
  Filled 2014-09-23 (×2): qty 15

## 2014-09-23 MED ORDER — SODIUM CHLORIDE 0.9 % IV SOLN
Freq: Once | INTRAVENOUS | Status: AC
  Administered 2014-09-23: 04:00:00 via INTRAVENOUS

## 2014-09-23 MED ORDER — SODIUM CHLORIDE 0.9 % IJ SOLN: 3.0000 mL | INTRAMUSCULAR | Status: DC | PRN

## 2014-09-23 MED ORDER — ALTEPLASE 2 MG IJ SOLR
2.0000 mg | Freq: Once | INTRAMUSCULAR | Status: DC | PRN
  Filled 2014-09-23: qty 2

## 2014-09-23 MED ORDER — DARBEPOETIN ALFA 200 MCG/0.4ML IJ SOSY
200.0000 ug | PREFILLED_SYRINGE | INTRAMUSCULAR | Status: DC
  Administered 2014-09-23: 200 ug via INTRAVENOUS

## 2014-09-23 MED ORDER — FENTANYL CITRATE (PF) 100 MCG/2ML IJ SOLN
INTRAMUSCULAR | Status: AC
  Filled 2014-09-23: qty 2

## 2014-09-23 MED ORDER — PENTAFLUOROPROP-TETRAFLUOROETH EX AERO: 1.0000 "application " | INHALATION_SPRAY | CUTANEOUS | Status: DC | PRN

## 2014-09-23 MED ORDER — NEPRO/CARBSTEADY PO LIQD: 237.0000 mL | ORAL | Status: DC | PRN

## 2014-09-23 MED ORDER — SODIUM CHLORIDE 0.9 % IV SOLN: 100.0000 mL | INTRAVENOUS | Status: DC | PRN

## 2014-09-23 MED ORDER — ONDANSETRON HCL 4 MG PO TABS: 4.0000 mg | ORAL_TABLET | Freq: Four times a day (QID) | ORAL | Status: DC | PRN

## 2014-09-23 MED ORDER — SODIUM POLYSTYRENE SULFONATE 15 GM/60ML PO SUSP
30.0000 g | Freq: Once | ORAL | Status: AC
  Administered 2014-09-23: 30 g via ORAL
  Filled 2014-09-23: qty 120

## 2014-09-23 MED ORDER — LIDOCAINE HCL (PF) 1 % IJ SOLN: 5.0000 mL | INTRAMUSCULAR | Status: DC | PRN

## 2014-09-23 MED ORDER — INSULIN ASPART 100 UNIT/ML ~~LOC~~ SOLN
0.0000 [IU] | SUBCUTANEOUS | Status: DC
  Filled 2014-09-23: qty 1

## 2014-09-23 MED ORDER — LIDOCAINE-PRILOCAINE 2.5-2.5 % EX CREA: 1.0000 "application " | TOPICAL_CREAM | CUTANEOUS | Status: DC | PRN

## 2014-09-23 MED ORDER — SODIUM CHLORIDE 0.9 % IV SOLN: 62.5000 mg | INTRAVENOUS | Status: DC

## 2014-09-23 MED ORDER — ACETAMINOPHEN 650 MG RE SUPP: 650.0000 mg | Freq: Four times a day (QID) | RECTAL | Status: DC | PRN

## 2014-09-23 MED ORDER — SODIUM CHLORIDE 0.9 % IV SOLN: 250.0000 mL | INTRAVENOUS | Status: DC | PRN

## 2014-09-23 MED ORDER — PANTOPRAZOLE SODIUM 40 MG PO TBEC
40.0000 mg | DELAYED_RELEASE_TABLET | Freq: Two times a day (BID) | ORAL | Status: DC
  Administered 2014-09-23 – 2014-09-24 (×2): 40 mg via ORAL
  Filled 2014-09-23 (×2): qty 1

## 2014-09-23 MED ORDER — SODIUM CHLORIDE 0.9 % IJ SOLN
3.0000 mL | Freq: Two times a day (BID) | INTRAMUSCULAR | Status: DC
  Administered 2014-09-23 – 2014-09-24 (×3): 3 mL via INTRAVENOUS

## 2014-09-23 MED ORDER — OXYCODONE HCL 5 MG PO TABS: 5.0000 mg | ORAL_TABLET | ORAL | Status: DC | PRN

## 2014-09-23 MED ORDER — RENA-VITE PO TABS
1.0000 | ORAL_TABLET | Freq: Every day | ORAL | Status: DC
  Administered 2014-09-23: 1 via ORAL
  Filled 2014-09-23 (×2): qty 1

## 2014-09-23 MED ORDER — DARBEPOETIN ALFA 200 MCG/0.4ML IJ SOSY
PREFILLED_SYRINGE | INTRAMUSCULAR | Status: AC
  Administered 2014-09-23: 200 ug via INTRAVENOUS
  Filled 2014-09-23: qty 0.4

## 2014-09-23 MED ORDER — MIDAZOLAM HCL 5 MG/ML IJ SOLN
INTRAMUSCULAR | Status: AC
  Filled 2014-09-23: qty 2

## 2014-09-23 NOTE — Consult Note (Signed)
Consultation  Referring Provider:  [facility]    Primary Care Physician:  [name], MD Primary Gastroenterologist: Erskine Emery, MD        Reason for Consultation: anemia           HPI:   [name] is a 81 y.o. female is known to [place] for history of anemia felt to be secondary to [place] for which she underwent EGD with ablation January 2016. Patient got 2 units of blood that admission,  following that she was advised to take oral iron but someone discontinued it. Patient tells me she gets iron a couple of time a week at dialysis.    Patient presented to ED last night with SOB on exertion. Hg was 5.9. At her hospital follow up visit with [place] in March we documented post-transfusion hgb around 11 ( must of had labs from Nephrologist because nothing in EPIC).   No black stools or rectal bleeding. BMs at baselilne. No abdominal pain. No nausea and weight is stable.  No NSAID use and she takes a daily PPI.   Past Medical History  Diagnosis Date  . Hyperlipidemia   . Hypertension   . Anxiety   . Facial droop 1935    acquired during forceps delivery, some residual R visual loss  . Anemia in chronic kidney disease     IV iron infusion  . Hx of breast cancer 2004  . Arthritis     mild in lower back (Drs Posey Pronto and Joelyn Oms)  . End stage renal disease on dialysis     HD M,W,F [place] AVF now LUE AVF  . GERD (gastroesophageal reflux disease)   . Cardiac murmur     a. thought due to AVF (2D echo 06/2012 without significant valvular disease).   . Bradycardia     a. Noted 06/2012.  Marland Kitchen Hypotension     a. Associated w/ dialysis.  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 [name])  . Osteoporosis 11/2013    DEXA -3.4 radius  . Cancer     Past Surgical History  Procedure Laterality Date  . Cataract extraction  1981  . Av fistula placement  07/20/10    Right brachiocephalic AVF  . Umbilical hernia repair  2011  . Dexa  2013   [facility]  . US echocardiography  06/2012    LVH, EF 65%, grade 1 diastol dysfunction, mod dliated LAD  . Eye surgery  1966    regular cataract  . Breast lumpectomy Left   . Breast biopsy Left   . Bascilic vein transposition Left 07/24/2013    Procedure: BASILIC VEIN TRANSPOSITION;  Surgeon: [name], MD;  Location: [facility];  Service: Vascular;  Laterality: Left;  . Shuntogram N/A 04/16/2011    Procedure: Earney Mallet;  Surgeon: [name], MD;  Location: [facility] CATH LAB;  Service: Cardiovascular;  Laterality: N/A;  . Shuntogram Right 07/05/2013    Procedure: FISTULOGRAM;  Surgeon: [name], MD;  Location: [facility] CATH LAB;  Service: Cardiovascular;  Laterality: Right;  . Esophagogastroduodenoscopy N/A 04/09/2014    Procedure: ESOPHAGOGASTRODUODENOSCOPY (EGD);  Surgeon: [name], MD;  Location: [facility];  Service: Endoscopy;  Laterality: N/A;  . Peripheral vascular catheterization N/A 08/15/2014    Procedure: A/V Shuntogram/Fistulagram;  Surgeon: [name], MD;  Location: [facility] CV LAB;  Service: Cardiovascular;  Laterality: N/A;    Family History  Problem Relation Age  of Onset  . Heart disease Brother     Congenital  . Hypertension Brother   . Diabetes Brother   . Cancer Father     stomach  . CAD Father     MI at age 11  . Heart disease Father   . Heart attack Father   . Cancer Sister     female (uterus?)  . Hypertension Mother      History  Substance Use Topics  . Smoking status: Former Smoker -- 2 years    Types: Cigarettes    Quit date: 03/29/1953  . Smokeless tobacco: Former Systems developer  . Alcohol Use: Yes     Comment: occasional    Prior to Admission medications   Medication Sig Start Date End Date Taking? Authorizing Provider  amLODipine (NORVASC) 5 MG tablet TAKE 1 TABLET (5 MG TOTAL) BY MOUTH AT BEDTIME DAILY 08/30/14  Yes [name], MD  Artificial Tear Ointment (REFRESH P.M. OP) Place 1 application into the right eye daily.   Yes Historical  Provider, MD  B Complex-C-Folic Acid (RENA-VITE PO) Take 1 tablet by mouth daily with breakfast.    Yes Historical Provider, MD  Calcium Carbonate Antacid (TUMS ULTRA 1000 PO) Take 2,000 mg by mouth 3 (three) times daily with meals.    Yes Historical Provider, MD  fluticasone (FLONASE) 50 MCG/ACT nasal spray Place 2 sprays into both nostrils daily. Patient taking differently: Place 1 spray into both nostrils as needed for allergies.  06/13/14  Yes [name], MD  pantoprazole (PROTONIX) 40 MG tablet Take 1 tablet (40 mg total) by mouth daily. 06/27/14  Yes [name], MD  simvastatin (ZOCOR) 20 MG tablet TAKE ONE TABLET BY MOUTH AT BEDTIME 12/11/13  Yes [name], MD    Current Facility-Administered Medications  Medication Dose Route Frequency Provider Last Rate Last Dose  . 0.9 %  sodium chloride infusion  250 mL Intravenous PRN Theressa Millard, MD      . acetaminophen (TYLENOL) tablet 650 mg  650 mg Oral Q6H PRN Theressa Millard, MD       Or  . acetaminophen (TYLENOL) suppository 650 mg  650 mg Rectal Q6H PRN Theressa Millard, MD      . calcium carbonate (TUMS - dosed in mg elemental calcium) chewable tablet 2,000 mg  2,000 mg Oral TID WC Theressa Millard, MD      . Darbepoetin Alfa (ARANESP) injection 200 mcg  200 mcg Intravenous Q Mon-HD Ernest Haber, PA-C   200 mcg at 09/23/14 1042  . ferric gluconate (NULECIT) 62.5 mg in sodium chloride 0.9 % 100 mL IVPB  62.5 mg Intravenous Q M,W,F-HD Ernest Haber, PA-C      . fluticasone (FLONASE) 50 MCG/ACT nasal spray 2 spray  2 spray Each Nare Daily PRN Theressa Millard, MD      . HYDROmorphone (DILAUDID) injection 0.5-1 mg  0.5-1 mg Intravenous Q3H PRN Theressa Millard, MD      . insulin aspart (novoLOG) injection 0-9 Units  0-9 Units Subcutaneous 6 times per day Theressa Millard, MD   0 Units at 09/23/14 [phone]  . multivitamin (RENA-VIT) tablet 1 tablet  1 tablet Oral QHS Harvette Evonnie Dawes, MD      . ondansetron  [facility]) tablet 4 mg  4 mg Oral Q6H PRN Theressa Millard, MD       Or  . ondansetron (ZOFRAN) injection 4 mg  4 mg Intravenous Q6H PRN Theressa Millard, MD      .  oxyCODONE (Oxy IR/ROXICODONE) immediate release tablet 5 mg  5 mg Oral Q4H PRN Theressa Millard, MD      . pantoprazole (PROTONIX) EC tablet 40 mg  40 mg Oral Daily Harvette Evonnie Dawes, MD      . polyvinyl alcohol (LIQUIFILM TEARS) 1.4 % ophthalmic solution   Right Eye Daily Theressa Millard, MD      . simvastatin (ZOCOR) tablet 20 mg  20 mg Oral QHS Harvette Evonnie Dawes, MD      . sodium chloride 0.9 % injection 3 mL  3 mL Intravenous Q12H Harvette C Jenkins, MD      . sodium chloride 0.9 % injection 3 mL  3 mL Intravenous PRN Theressa Millard, MD        Allergies as of 09/22/2014 - Review Complete 09/22/2014  Allergen Reaction Noted  . Sulfa antibiotics Rash 12/14/2010    Review of Systems:    As per HPI, otherwise negative   Physical Exam:  Vital signs in last 24 hours: Temp:  [97.4 F (36.3 C)-98 F (36.7 C)] 97.4 F (36.3 C) (06/27 1223) Pulse Rate:  [48-89] 81 (06/27 1223) Resp:  [12-28] 20 (06/27 1223) BP: (98-181)/(38-79) 171/57 mmHg (06/27 1223) SpO2:  [81 %-100 %] 100 % (06/27 1223) Weight:  [112 lb 14 oz (51.2 kg)-119 lb 0.8 oz (54 kg)] 112 lb 14 oz (51.2 kg) (06/27 1223)   General:   Pleasant white female in NAD Head:  Normocephalic and atraumatic.Facial droop Eyes:   No icterus.   Conjunctiva pink. Ears:  Normal auditory acuity. Neck:  Supple Heart:  Regular rate and rhythm Abdomen:  Soft, nondistended, nontender. Normal bowel sounds. No appreciable masses or hepatomegaly.  Rectal:  Not performed.  Msk:  Symmetrical without gross deformities.  Extremities:  Without edema. Neurologic:  Alert and  oriented x4;  grossly normal neurologically. Skin:  Intact without significant lesions or rashes. Psych:  Alert and cooperative. Normal affect.  LAB RESULTS:  Recent Labs  09/22/14 2355  09/23/14 0434 09/23/14 0726  WBC 7.4 8.2 7.2  HGB 5.9* 5.6* 8.1*  HCT 18.7* 17.6* 25.3*  PLT 301 287 272   BMET  Recent Labs  09/23/14 0004 09/23/14 0434 09/23/14 0726  NA 137 138 138  K 5.9* 6.0* 5.6*  CL 96* 99* 100*  CO2 25 26 25   GLUCOSE 150* 154* 137*  BUN 76* 83* 87*  CREATININE 7.11* 7.50* 7.47*  CALCIUM 7.8* 8.2* 8.2*   STUDIES: Dg Chest Port 1 View  09/22/2014   CLINICAL DATA:  Shortness of breath and fatigue. History of hypertension, dialysis patient. History of breast cancer.  EXAM: PORTABLE CHEST - 1 VIEW  COMPARISON:  Chest radiograph October 23, 2013  FINDINGS: The cardiac silhouette appears mildly enlarged. Mediastinal silhouette is nonsuspicious, mildly calcified aortic knob. Persistently elevated RIGHT hemidiaphragm. Rounded density projects in RIGHT middle lobe, and was present previously. Mild interstitial prominence, pulmonary vascular congestion without pleural effusion. No pneumothorax. Soft tissue planes and included osseous structures are nonsuspicious. Vascular stent LEFT upper extremity. Coarse calcifications project in LEFT breast.  IMPRESSION: Mild cardiomegaly. Increasing interstitial prominence likely represents pulmonary edema, less likely atypical infection.  Rounded nodular density projecting RIGHT middle lobe may reflect prominent vessel, similar.   Electronically Signed   By: Elon Alas M.D.   On: 09/22/2014 23:51    PREVIOUS ENDOSCOPIES:            EGD January 2016 ENDOSCOPIC IMPRESSION: 1. Innumerable number of small arteriovenous malformations  were found in the gastric antrum; Argon plasma coagulation was applied to the sites; with good treatment effect 2. 3 cm hiatal hernia 3. EGD was otherwise normal RECOMMENDATIONS: twice a day PPI therapy for 2 weeks, then daily Iron supplementation Check CBC every 2 and then 4 weeks for stability Repeat APC treatment as needed   Impression / Plan:   63.  81 year old female with recurrent anemia  and heme positive stools. Appropriate rise in hgb to 8.1 after 2 units of blood.  States home iron was stopped by another provider and she gets iron at dialysis a couple of times a week. Hgb was around 11 per our last office note in March, she presented this admission with hgb of 5.9. 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 Patient NPO, will proceed with EGD this afternoon.   2. Facial droop, congenital.   3. ESRD, on HD  Thanks   LOS: 0 days   Tye Savoy  09/23/2014, 12:32 PM   GI Attending Note   Chart was reviewed and patient was examined. X-rays and lab were reviewed.    I agree with management and plans.  She was admitted with a symptomatic anemia and Hemoccult-positive stool.  Presumably she continues to bleed from gastric vascular ectatic lesions.  Note patient has never had a colonoscopy but refuses.  If no bleeding sources seen by upper endoscopy I would prevail upon the patient to undergo colonoscopy.  Should upper endoscopy diagnostic for recurrent bleeding.  She will be treated with the argon plasma coagulator.  Would obtain a Cologuard under these circumstances.  Sandy Salaam. Deatra Ina, M.D., [facility] [phone]

## 2014-09-23 NOTE — Op Note (Signed)
[facility] [place], [id number]   ENDOSCOPY PROCEDURE REPORT  PATIENT: [name], [name]  MR#: [id number] BIRTHDATE: [birth year] , 82  yrs. old GENDER: female ENDOSCOPIST: [name], MD REFERRED BY:  [name], M.D. PROCEDURE DATE:  09/23/2014 PROCEDURE:  EGD w/ control of bleeding ASA CLASS:     Class III INDICATIONS:  occult blood positive and acute post hemorrhagic anemia. MEDICATIONS: Versed 4 mg IV, Fentanyl 50 mcg IV, and Glycopyrrolate (Robinul) 0.2 mg IV TOPICAL ANESTHETIC: Cetacaine Spray  DESCRIPTION OF PROCEDURE: After the risks benefits and alternatives of the procedure were thoroughly explained, informed consent was obtained.  The    endoscope was introduced through the mouth and advanced to the second portion of the duodenum , Without limitations.  The instrument was slowly withdrawn as the mucosa was fully examined.    STOMACH: An innumerable number of oozing arteriovenous malformations were found in the gastric antrum.  Argon plasma coagulation was applied to the sites.  With slower bleeding resulting with partial hemostasis achieved.   Except for the findings listed, the EGD was otherwise normal.  Retroflexed views revealed no abnormalities. The scope was then withdrawn from the patient and the procedure completed.  COMPLICATIONS: There were no immediate complications.  ENDOSCOPIC IMPRESSION: 1.   Innumerable number of arteriovenous malformations were found in the gastric antrum; Argon plasma coagulation was applied to the sites; with slower bleeding resulting with partial hemostasis achieved 2.   EGD was otherwise normal  RECOMMENDATIONS: PPI therapy twice a day Follow-up EGD with APC in 2-3 weeks Iron supplementation  REPEAT EXAM:  eSigned:  [name], MD 09/23/2014 4:48 PM    CC:  PATIENT NAME:  Michelena, Culmer MR#: [id number]

## 2014-09-23 NOTE — Progress Notes (Signed)
P/c from [facility] @ 2419 stating that the pt's Troponin Level was 1.34. Pt's RN, Loni Muse aware @ [phone]. Per Maudie Mercury, she will inform the [facility] on call. Jobe Igo, RN

## 2014-09-23 NOTE — Consult Note (Signed)
Admit date: 09/22/2014 Referring Physician  Dr. Arnoldo Morale Primary Physician [name], MD Primary Cardiologist  Dr. Stanford Breed Reason for Consultation  Elevated troponin  HPI: 81 year old female here with GI bleed status post 2 units of packed red blood cells with multiple AV malformations noted on EGD today by Dr. Deatra Ina who had troponin drawn, no complaints of chest pain, in the setting of hemoglobin of 5.9. She'll size end-stage renal disease on hemodialysis Monday Wednesday Friday.  Her main complaint in the emergency department was dyspnea, in the setting of anemia and EKG did show anterolateral ischemic changes. Her dyspnea and orthopnea has been present for one week. Hyperkalemia was noted originally.  EKG from 09/23/14 demonstrates sinus rhythm with diffuse ST segment depression. Possibly global ischemia in the setting of severe anemia. Prior EKG shows subtle ST segment changes but certainly her most recent EKGs are accentuated. Normal ejection fraction in 2014 echocardiogram. Nuclear stress test 08/02/12 was normal.  PMH:   Past Medical History  Diagnosis Date  . Hyperlipidemia   . Hypertension   . Anxiety   . Facial droop 1935    acquired during forceps delivery, some residual R visual loss  . Anemia in chronic kidney disease     IV iron infusion  . Hx of breast cancer 2004  . Arthritis     mild in lower back (Drs Posey Pronto and Joelyn Oms)  . End stage renal disease on dialysis     HD M,W,F [place] AVF now LUE AVF  . GERD (gastroesophageal reflux disease)   . Cardiac murmur     a. thought due to AVF (2D echo 06/2012 without significant valvular disease).   . Bradycardia     a. Noted 06/2012.  Marland Kitchen Hypotension     a. Associated w/ dialysis.  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 [name])  . Osteoporosis 11/2013    DEXA -3.4 radius  . Cancer     PSH:   Past Surgical History  Procedure Laterality Date  . Cataract extraction  1981    . Av fistula placement  07/20/10    Right brachiocephalic AVF  . Umbilical hernia repair  2011  . Dexa  2013    [facility]  . US echocardiography  06/2012    LVH, EF 65%, grade 1 diastol dysfunction, mod dliated LAD  . Eye surgery  1966    regular cataract  . Breast lumpectomy Left   . Breast biopsy Left   . Bascilic vein transposition Left 07/24/2013    Procedure: BASILIC VEIN TRANSPOSITION;  Surgeon: [name], MD;  Location: [place];  Service: Vascular;  Laterality: Left;  . Shuntogram N/A 04/16/2011    Procedure: Earney Mallet;  Surgeon: [name], MD;  Location: [facility] CATH LAB;  Service: Cardiovascular;  Laterality: N/A;  . Shuntogram Right 07/05/2013    Procedure: FISTULOGRAM;  Surgeon: [name], MD;  Location: [facility] CATH LAB;  Service: Cardiovascular;  Laterality: Right;  . Esophagogastroduodenoscopy N/A 04/09/2014    Procedure: ESOPHAGOGASTRODUODENOSCOPY (EGD);  Surgeon: [name], MD;  Location: [place];  Service: Endoscopy;  Laterality: N/A;  . Peripheral vascular catheterization N/A 08/15/2014    Procedure: A/V Shuntogram/Fistulagram;  Surgeon: [name], MD;  Location: [place] CV LAB;  Service: Cardiovascular;  Laterality: N/A;   Allergies:  Sulfa antibiotics Prior to Admit Meds:   Prior to Admission medications   Medication Sig Start Date End Date Taking? Authorizing Provider  amLODipine (NORVASC) 5 MG tablet TAKE 1 TABLET (5 MG TOTAL) BY MOUTH AT BEDTIME DAILY 08/30/14  Yes [name], MD  Artificial Tear Ointment (REFRESH P.M. OP) Place 1 application into the right eye daily.   Yes Historical Provider, MD  B Complex-C-Folic Acid (RENA-VITE PO) Take 1 tablet by mouth daily with breakfast.    Yes Historical Provider, MD  Calcium Carbonate Antacid (TUMS ULTRA 1000 PO) Take 2,000 mg by mouth 3 (three) times daily with meals.    Yes Historical Provider, MD  fluticasone (FLONASE) 50 MCG/ACT nasal spray Place 2 sprays into both nostrils daily. Patient  taking differently: Place 1 spray into both nostrils as needed for allergies.  06/13/14  Yes [name], MD  pantoprazole (PROTONIX) 40 MG tablet Take 1 tablet (40 mg total) by mouth daily. 06/27/14  Yes [name], MD  simvastatin (ZOCOR) 20 MG tablet TAKE ONE TABLET BY MOUTH AT BEDTIME 12/11/13  Yes [name], MD   Fam HX:    Family History  Problem Relation Age of Onset  . Heart disease Brother     Congenital  . Hypertension Brother   . Diabetes Brother   . Cancer Father     stomach  . CAD Father     MI at age 81  . Heart disease Father   . Heart attack Father   . Cancer Sister     female (uterus?)  . Hypertension Mother    Social HX:    History   Social History  . Marital Status: Single    Spouse Name: N/A  . Number of Children: N/A  . Years of Education: N/A   Occupational History  . 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 Smoking status: Former Smoker -- 2 years    Types: Cigarettes    Quit date: 03/29/1953  . Smokeless tobacco: Former Systems developer  . Alcohol Use: Yes     Comment: occasional  . Drug Use: No  . Sexual Activity: Not on file   Other Topics Concern  . Not on file   Social History Narrative   As of 03/2013. Lives alone with her small dog.  Bother and sister in law died last year.     Occupation: retired, was Information systems manager for [employer]   Edu: some college     ROS:  Denies any melana, chest pain, syncope. All 11 ROS were addressed and are negative except what is stated in the HPI   Physical Exam: Blood pressure 107/39, pulse 81, temperature 99.1 F (37.3 C), temperature source Oral, resp. rate 18, height 4\' 11"  (1.499 m), weight 112 lb 14 oz (51.2 kg), SpO2 98 %.   General: Thin, in no acute distress Head: Congenital facial droop noted  Lungs:   Mild bibasal or wheezes, normal respiratory effort. Heart:   HRRR S1 S2 Pulses are 2+ & equal. No murmur,  rubs, gallops.  No carotid bruit. No JVD.  No abdominal bruits.  Abdomen: Bowel sounds are positive, abdomen soft and non-tender without masses. No hepatosplenomegaly. Msk:  Back normal.  Extremities:  No clubbing, cyanosis or edema.  DP +1 Neuro: Alert and oriented X 3, non-focal other than facial droop, MAE x 4 GU: Deferred Rectal: Deferred Psych:  Good affect, responds appropriately      Labs: Lab Results  Component Value Date   WBC 7.2 09/23/2014   HGB 8.1* 09/23/2014  HCT 25.3* 09/23/2014   MCV 88.5 09/23/2014   PLT 272 09/23/2014     Recent Labs Lab 09/23/14 0726  NA 138  K 5.6*  CL 100*  CO2 25  BUN 87*  CREATININE 7.47*  CALCIUM 8.2*  GLUCOSE 137*    Recent Labs  09/22/14 2355 09/23/14 0427 09/23/14 1320  TROPONINI 0.13* 0.21* 1.34*   No results found for: CHOL, HDL, LDLCALC, TRIG No results found for: DDIMER   Radiology:  Dg Chest Port 1 View  09/22/2014   CLINICAL DATA:  Shortness of breath and fatigue. History of hypertension, dialysis patient. History of breast cancer.  EXAM: PORTABLE CHEST - 1 VIEW  COMPARISON:  Chest radiograph October 23, 2013  FINDINGS: The cardiac silhouette appears mildly enlarged. Mediastinal silhouette is nonsuspicious, mildly calcified aortic knob. Persistently elevated RIGHT hemidiaphragm. Rounded density projects in RIGHT middle lobe, and was present previously. Mild interstitial prominence, pulmonary vascular congestion without pleural effusion. No pneumothorax. Soft tissue planes and included osseous structures are nonsuspicious. Vascular stent LEFT upper extremity. Coarse calcifications project in LEFT breast.  IMPRESSION: Mild cardiomegaly. Increasing interstitial prominence likely represents pulmonary edema, less likely atypical infection.  Rounded nodular density projecting RIGHT middle lobe may reflect prominent vessel, similar.   Electronically Signed   By: Elon Alas M.D.   On: 09/22/2014 23:51   Personally  viewed.  EKG:  Multiple EKGs reviewed as above, ST segment depression globally noted. Personally viewed.   ASSESSMENT/PLAN:    81 year old female with severe anemia as a result of GI bleed from AV malformations status post EGD with shortness of breath likely a result of anemia, elevated troponin originally 0.21>> 1.34, 8 hours later, with hemoglobin increased from 5.6 up to 8.1 after 2 units of packed red blood cells. On hemodialysis.  1. Elevated troponin-with her EKG abnormality, ST segment depression which is accentuated from baseline EKG and increase of troponin to 1.34 she definitely has significant demand ischemia in the setting of severe anemia, and by definition based upon EKG finding an elevated troponin, non-ST elevation myocardial infarction however this is not likely true ACS, thrombotic event. Recent nuclear stress test was low risk with no ischemia in May 2014. Given her end-stage renal disease however she is at increased risk for coronary artery disease. Her EKG could be indicative of triple-vessel disease underlying that was precipitated by severe anemia. This could also be seen however with global ischemia in the setting of severe anemia even with fairly normal arteries. After she is stable from this current event, it would not be unreasonable to proceed with stress test to further risk stratify. If high risk, or signs of transient ischemic dilatation is noted, consider diagnostic angiogram weighing the risks and benefits of bleeding. Elevated troponin nonetheless portends an increased cardiac morbidity. I explained this clearly to her and her family members/friends.  2. End-stage renal disease on hemodialysis Monday Wednesday and Friday-per nephrology.   3. Gastric AV malformations-please see Dr. Kelby Fam GI note.  4. Severe anemia-status post packed red blood cells 2.  We will continue to follow. Watch for any signs of arrhythmias. I will order echocardiogram. Obviously with  recent GI bleed, no heparin, no aspirin. Blood pressure too low to add beta blocker. Continue with simvastatin.  Candee Furbish, MD  09/23/2014  6:14 PM

## 2014-09-23 NOTE — Procedures (Addendum)
[facility] MWF   3h  2/2 bath  50kg  Heparin 2000  LUA AVF Mircera 150 every 2, last 6/25 No Fe/ vit D   I was present at this dialysis session, have reviewed the session itself and made  appropriate changes Kelly Splinter MD (pgr) [phone]    ([phone] 09/14/2014, 10:31 AM

## 2014-09-23 NOTE — ED Notes (Signed)
Dr Johnney Killian given a copy of troponin results .12

## 2014-09-23 NOTE — Progress Notes (Addendum)
Progress note made in error  

## 2014-09-23 NOTE — ED Notes (Signed)
Blood bank called to inquire about type and screen.

## 2014-09-23 NOTE — Care Management Note (Signed)
Case Management Note  Patient Details  Name: [name] MRN: [id number] Date of Birth: 19-Nov-1931  Subjective/Objective:    Adm w shortness of breath              Action/Plan: lives alone   Expected Discharge Date:                  Expected Discharge Plan:     In-House Referral:     Discharge planning Services     Post Acute Care Choice:    Choice offered to:     DME Arranged:    DME Agency:     HH Arranged:    [place] Agency:     Status of Service:     Medicare Important Message Given:    Date Medicare IM Given:    Medicare IM give by:    Date Additional Medicare IM Given:    Additional Medicare Important Message give by:     If discussed at [place] of Stay Meetings, dates discussed:    Additional Comments: ur review done  Lacretia Leigh, RN 09/23/2014, 1:10 PM

## 2014-09-23 NOTE — ED Notes (Signed)
Attempted second IV x3, unsuccessful.

## 2014-09-23 NOTE — Progress Notes (Signed)
CRITICAL VALUE ALERT  Critical value received:  Troponin 1.34  Date of notification: 09/23/2014  Time of notification:  4035  Critical value read back: yes  Nurse who received alert:  Loni Muse, RN  MD notified (1st page):  [place]  Time of first page:  1515  MD notified (2nd page):  Time of second page:  Responding MD: Florencia Reasons  Time MD responded:  1520

## 2014-09-23 NOTE — Progress Notes (Signed)
Called By RN to assess patient being transferred to D 30 from room E 48 for Low Sat after walking to restroom and increased WOB . Patient on NRB and SATs 100% upon arrival. Patient has good BBS with some slight upper airway wheezes from some forced exhalations. NO crackles or rhonchi at this time. Patient having some difficulty with breathing but also is awaiting blood for a low hemoglobin. HR remains 75-90 with RR ranging from 22-30. No need for BiPAP at this time, but will reassess if the need arrives. Spoke to patient about wearing BiPAP if needed and she had no recollection of wearing one prior but understands it may be something we need to try. RN aware and will continue to monitor per protocol.

## 2014-09-23 NOTE — Consult Note (Signed)
Referring Provider: No ref. provider found Primary Care Physician:  [name], MD Primary Nephrologist:    Reason for Consultation:  Dyspnea on exercise and increased swelling in legs  HPI:  ESRD patient that dialyzed MWF dialysis came to the ER with increased dyspnea at rest and dyspnea on minimal exertion. She has was found to have increased anemia and an EKG with some anterolateral ischemic changes. There is no cough or congestion and no sputum production, no fever sweats or chills.  Past Medical History  Diagnosis Date  . Hyperlipidemia   . Hypertension   . Anxiety   . Facial droop 1935    acquired during forceps delivery, some residual R visual loss  . Anemia in chronic kidney disease     IV iron infusion  . Hx of breast cancer 2004  . Arthritis     mild in lower back (Drs Posey Pronto and Joelyn Oms)  . End stage renal disease on dialysis     HD M,W,F [place] AVF now LUE AVF  . GERD (gastroesophageal reflux disease)   . Cardiac murmur     a. thought due to AVF (2D echo 06/2012 without significant valvular disease).   . Bradycardia     a. Noted 06/2012.  Marland Kitchen Hypotension     a. Associated w/ dialysis.  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 [name])  . Osteoporosis 11/2013    DEXA -3.4 radius  . Cancer     Past Surgical History  Procedure Laterality Date  . Cataract extraction  1981  . Av fistula placement  07/20/10    Right brachiocephalic AVF  . Umbilical hernia repair  2011  . Dexa  2013    [facility]  . US echocardiography  06/2012    LVH, EF 65%, grade 1 diastol dysfunction, mod dliated LAD  . Eye surgery  1966    regular cataract  . Breast lumpectomy Left   . Breast biopsy Left   . Bascilic vein transposition Left 07/24/2013    Procedure: BASILIC VEIN TRANSPOSITION;  Surgeon: [name], MD;  Location: [place];  Service: Vascular;  Laterality: Left;  . Shuntogram N/A 04/16/2011    Procedure: Earney Mallet;  Surgeon: [name], MD;  Location: [facility] CATH LAB;  Service: Cardiovascular;  Laterality: N/A;  . Shuntogram Right 07/05/2013    Procedure: FISTULOGRAM;  Surgeon: [name], MD;  Location: [facility] CATH LAB;  Service: Cardiovascular;  Laterality: Right;  . Esophagogastroduodenoscopy N/A 04/09/2014    Procedure: ESOPHAGOGASTRODUODENOSCOPY (EGD);  Surgeon: [name], MD;  Location: [place];  Service: Endoscopy;  Laterality: N/A;  . Peripheral vascular catheterization N/A 08/15/2014    Procedure: A/V Shuntogram/Fistulagram;  Surgeon: [name], MD;  Location: [place] CV LAB;  Service: Cardiovascular;  Laterality: N/A;    Prior to Admission medications   Medication Sig Start Date End Date Taking? Authorizing Provider  amLODipine (NORVASC) 5 MG tablet TAKE 1 TABLET (5 MG TOTAL) BY MOUTH AT BEDTIME DAILY 08/30/14  Yes [name], MD  Artificial Tear Ointment (REFRESH P.M. OP) Place 1 application into the right eye daily.   Yes Historical Provider, MD  B Complex-C-Folic Acid (RENA-VITE PO) Take 1 tablet by mouth daily with breakfast.    Yes Historical Provider, MD  Calcium Carbonate Antacid (TUMS ULTRA 1000 PO) Take 2,000 mg by mouth 3 (three) times daily with meals.    Yes Historical Provider, MD  fluticasone (FLONASE) 50 MCG/ACT  nasal spray Place 2 sprays into both nostrils daily. Patient taking differently: Place 1 spray into both nostrils as needed for allergies.  06/13/14  Yes [name], MD  pantoprazole (PROTONIX) 40 MG tablet Take 1 tablet (40 mg total) by mouth daily. 06/27/14  Yes [name], MD  simvastatin (ZOCOR) 20 MG tablet TAKE ONE TABLET BY MOUTH AT BEDTIME 12/11/13  Yes [name], MD    Current Facility-Administered Medications  Medication Dose Route Frequency Provider Last Rate Last Dose  . 0.9 %  sodium chloride infusion   Intravenous Once Harvette Evonnie Dawes, MD      . insulin aspart (novoLOG) injection 0-9 Units  0-9 Units Subcutaneous 6 times per day  Theressa Millard, MD      . sodium polystyrene (KAYEXALATE) 15 GM/60ML suspension 30 g  30 g Oral Once Charlesetta Shanks, MD       Current Outpatient Prescriptions  Medication Sig Dispense Refill  . amLODipine (NORVASC) 5 MG tablet TAKE 1 TABLET (5 MG TOTAL) BY MOUTH AT BEDTIME DAILY 30 tablet 11  . Artificial Tear Ointment (REFRESH P.M. OP) Place 1 application into the right eye daily.    . B Complex-C-Folic Acid (RENA-VITE PO) Take 1 tablet by mouth daily with breakfast.     . Calcium Carbonate Antacid (TUMS ULTRA 1000 PO) Take 2,000 mg by mouth 3 (three) times daily with meals.     . fluticasone (FLONASE) 50 MCG/ACT nasal spray Place 2 sprays into both nostrils daily. (Patient taking differently: Place 1 spray into both nostrils as needed for allergies. ) 16 g 3  . pantoprazole (PROTONIX) 40 MG tablet Take 1 tablet (40 mg total) by mouth daily. 30 tablet 5  . simvastatin (ZOCOR) 20 MG tablet TAKE ONE TABLET BY MOUTH AT BEDTIME 30 tablet 10    Allergies as of 09/22/2014 - Review Complete 09/22/2014  Allergen Reaction Noted  . Sulfa antibiotics Rash 12/14/2010    Family History  Problem Relation Age of Onset  . Heart disease Brother     Congenital  . Hypertension Brother   . Diabetes Brother   . Cancer Father     stomach  . CAD Father     MI at age 53  . Heart disease Father   . Heart attack Father   . Cancer Sister     female (uterus?)  . Hypertension Mother     History   Social History  . Marital Status: Single    Spouse Name: N/A  . Number of Children: N/A  . Years of Education: N/A   Occupational History  . 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 Smoking status: Former Smoker -- 2 years    Types: Cigarettes    Quit date: 03/29/1953  . Smokeless tobacco: Former Systems developer  . Alcohol Use: Yes     Comment: occasional  . Drug Use: No  . Sexual Activity: Not on file   Other Topics Concern  . Not on  file   Social History Narrative   As of 03/2013. Lives alone with her small dog.  Bother and sister in law died last year.     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 Normal external appearance No Epistaxis or Sore throat. No sinusitis.   CV: Denies chest pain, angina, palpitations, syncope, orthopnea, PND, + peripheral edema, and claudication. Resp: Dyspnea on minimal exertion and at rest GI: Denies vomiting blood, jaundice, and fecal incontinence.   Denies dysphagia or odynophagia. GU : ESRD MS: Denies joint pain, limitation of movement, and swelling, stiffness, low back pain, extremity pain. Denies muscle weakness, cramps, atrophy.  No use of non steroidal antiinflammatory drugs. Derm: Denies rash, itching, dry skin, hives, moles, warts, or unhealing ulcers.  Psych: Denies depression, anxiety, memory loss, suicidal ideation, hallucinations, paranoia, and confusion. Heme: Denies bruising, bleeding, and enlarged lymph nodes. Neuro: No headache.  No diplopia. No dysarthria.  No dysphasia.  No history of CVA.  No Seizures. No paresthesias.  No weakness. Endocrine No DM.  No Thyroid disease.  No Adrenal disease.  Physical Exam: Vital signs in last 24 hours: Temp:  [98 F (36.7 C)] 98 F (36.7 C) (06/26 2146) Pulse Rate:  [48-89] 87 (06/27 0230) Resp:  [12-28] 28 (06/27 0230) BP: (104-157)/(43-66) 104/66 mmHg (06/27 0230) SpO2:  [81 %-100 %] 100 % (06/27 0230)   General:   Ill appearing lady Head:  Normocephalic and atraumatic. Eyes:  Sclera clear, no icterus.   Conjunctiva Pale Ears:  Normal auditory acuity. Nose:  No deformity, discharge,  or lesions. Mouth:  No deformity or lesions, dentition normal. Neck:  Supple; no masses or thyromegaly. JVP not elevated Lungs:   Congested with rhonchi and rales Heart:  Regular rate and rhythm; no murmurs, clicks, rubs,  or gallops. Abdomen:  Soft, nontender and nondistended. No masses, hepatosplenomegaly or hernias noted. Normal bowel sounds, without guarding, and without rebound.   Msk:  Symmetrical without gross deformities. Normal posture. Pulses:  No carotid, renal, femoral bruits. DP and PT symmetrical and equal Extremities:  Trace edema Neurologic:  Alert and  oriented x4;   7th nerve palsy Skin:  Intact without significant lesions or rashes.   Intake/Output from previous day:   Intake/Output this shift:    Lab Results:  Recent Labs  09/22/14 2355  WBC 7.4  HGB 5.9*  HCT 18.7*  PLT 301   BMET  Recent Labs  09/23/14 0004  NA 137  K 5.9*  CL 96*  CO2 25  GLUCOSE 150*  BUN 76*  CREATININE 7.11*  CALCIUM 7.8*   LFT No results for input(s): PROT, ALBUMIN, AST, ALT, ALKPHOS, BILITOT, BILIDIR, IBILI in the last 72 hours. PT/INR  Recent Labs  09/22/14 2355  LABPROT 13.6  INR 1.02   Hepatitis Panel No results for input(s): HEPBSAG, HCVAB, HEPAIGM, HEPBIGM in the last 72 hours.  Studies/Results: Dg Chest Port 1 View  09/22/2014   CLINICAL DATA:  Shortness of breath and fatigue. History of hypertension, dialysis patient. History of breast cancer.  EXAM: PORTABLE CHEST - 1 VIEW  COMPARISON:  Chest radiograph October 23, 2013  FINDINGS: The cardiac silhouette appears mildly enlarged. Mediastinal silhouette is nonsuspicious, mildly calcified aortic knob. Persistently elevated RIGHT hemidiaphragm. Rounded density projects in RIGHT middle lobe, and was present previously. Mild interstitial prominence, pulmonary vascular congestion without pleural effusion. No pneumothorax. Soft tissue planes and included osseous structures are nonsuspicious. Vascular stent LEFT upper extremity. Coarse calcifications project in LEFT breast.  IMPRESSION: Mild cardiomegaly. Increasing interstitial prominence likely  represents pulmonary edema, less likely atypical infection.  Rounded nodular density projecting RIGHT middle lobe may reflect prominent vessel, similar.   Electronically Signed   By: Elon Alas  M.D.   On: 09/22/2014 23:51    Assessment/Plan:  ESRD- MWF dialysis will plan dialysis emergently  ANEMIA- Low Hb will need transfusion on dialysis and evaluation for anemia  MBD- controlled   HTN/VOL- mild volume overload with vascular congestion and some myocardial ischemia secondary to anemia  ACCESS-AVF with thrill and bruit    LOS: 0 [place] W @TODAY @3 :08 AM

## 2014-09-23 NOTE — Progress Notes (Signed)
Hemodialysis- Unable to meet UF goal d/t severe cramping last half hour of treatment. Total UF 2.3L. Relieved completely after rinseback. Awaiting stepdown bed for patient currently, continue to monitor.

## 2014-09-23 NOTE — ED Notes (Signed)
Dr. Johnney Killian made aware of ST depressions on EKG, asked to see pt at bedside.

## 2014-09-23 NOTE — Progress Notes (Signed)
Troponin continue to trend up, patient did not c/o chest pain, continue trend troponin, order echocardiogram, this is likely demand ischemia due to acute anemia. Cardiology consulted by EDP, however, apparently patient is not on cards consult list, paged cards, now patient is on cards consult list.  Acute blood loss anemia, with h/o GAVE, s/p prbcx2units, [facility] consulted, EGD today.

## 2014-09-23 NOTE — H&P (Signed)
[facility] Admission History and Physical       [name] BMW:[id number] DOB: [birth year] DOA: 09/22/2014  Referring physician: EDP PCP: [name], MD  Specialists:   Chief Complaint: SOB  HPI: [name] is a 81 y.o. female with a history of ESRD on HD usually on MWF, HTN, Anemia, Hyperlipidemia, remote Hx of Breast Cancer who presents to the ED with complaints of worsening SOB and DOE along with Orthopnea x 1 week.   She deneis having Chest pain.   She was dialyzed on Tuesday, Wednesday and Friday this past week, and deneis missing any dialysis treatments.   She was observed to have hypoxia with any exertion while in the ED, and was placed on 4 liters NCO2.   A chest X=Ray revealed CHF changes.   She was also found to have Ischemic changes on EKG along with Hyperkalemia and worsening of her anemia.   The EDP contacted the Nephrologist on [place] for Emergent Dialysis Treatment along with transfusion due to her ischemia and she was referred to Medicine for [place].    Review of Systems:  Constitutional: No Weight Loss, No Weight Gain, Night Sweats, Fevers, Chills, Dizziness, Light Headedness, Fatigue, or Generalized Weakness HEENT: No Headaches, Difficulty Swallowing,Tooth/Dental Problems,Sore Throat,  No Sneezing, Rhinitis, Ear Ache, Nasal Congestion, or Post Nasal Drip,  Cardio-vascular:  No Chest pain, +Orthopnea, PND, Edema in Lower Extremities, Anasarca, Dizziness, Palpitations  Resp:  +Dyspnea, +DOE, No Productive Cough, No Non-Productive Cough, No Hemoptysis, No Wheezing.    GI: No Heartburn, Indigestion, Abdominal Pain, Nausea, Vomiting, Diarrhea, Constipation, Hematemesis, Hematochezia, Melena, Change in Bowel Habits,  Loss of Appetite  GU: No Dysuria, No Change in Color of Urine, No Urgency or Urinary Frequency, No Flank pain.  Musculoskeletal: No Joint Pain or Swelling, No Decreased Range of Motion, No Back Pain.  Neurologic: No Syncope, No Seizures, Muscle  Weakness, Paresthesia, Vision Disturbance or Loss, No Diplopia, No Vertigo, No Difficulty Walking,  Skin: No Rash or Lesions. Psych: No Change in Mood or Affect, No Depression or Anxiety, No Memory loss, No Confusion, or Hallucinations   Past Medical History  Diagnosis Date  . Hyperlipidemia   . Hypertension   . Anxiety   . Facial droop 1935    acquired during forceps delivery, some residual R visual loss  . Anemia in chronic kidney disease     IV iron infusion  . Hx of breast cancer 2004  . Arthritis     mild in lower back (Drs Posey Pronto and Joelyn Oms)  . End stage renal disease on dialysis     HD M,W,F [place] AVF now LUE AVF  . GERD (gastroesophageal reflux disease)   . Cardiac murmur     a. thought due to AVF (2D echo 06/2012 without significant valvular disease).   . Bradycardia     a. Noted 06/2012.  Marland Kitchen Hypotension     a. Associated w/ dialysis.  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 [name])  . Osteoporosis 11/2013    DEXA -3.4 radius  . Cancer      Past Surgical History  Procedure Laterality Date  . Cataract extraction  1981  . Av fistula placement  07/20/10    Right brachiocephalic AVF  . Umbilical hernia repair  2011  . Dexa  2013    [facility]  . US echocardiography  06/2012    LVH, EF 65%, grade 1 diastol dysfunction, mod dliated LAD  .  Eye surgery  1966    regular cataract  . Breast lumpectomy Left   . Breast biopsy Left   . Bascilic vein transposition Left 07/24/2013    Procedure: BASILIC VEIN TRANSPOSITION;  Surgeon: [name], MD;  Location: [place];  Service: Vascular;  Laterality: Left;  . Shuntogram N/A 04/16/2011    Procedure: Earney Mallet;  Surgeon: [name], MD;  Location: [facility] CATH LAB;  Service: Cardiovascular;  Laterality: N/A;  . Shuntogram Right 07/05/2013    Procedure: FISTULOGRAM;  Surgeon: [name], MD;  Location: [facility] CATH LAB;  Service: Cardiovascular;  Laterality: Right;  .  Esophagogastroduodenoscopy N/A 04/09/2014    Procedure: ESOPHAGOGASTRODUODENOSCOPY (EGD);  Surgeon: [name], MD;  Location: [place];  Service: Endoscopy;  Laterality: N/A;  . Peripheral vascular catheterization N/A 08/15/2014    Procedure: A/V Shuntogram/Fistulagram;  Surgeon: [name], MD;  Location: [place] CV LAB;  Service: Cardiovascular;  Laterality: N/A;      Prior to Admission medications   Medication Sig Start Date End Date Taking? Authorizing Provider  amLODipine (NORVASC) 5 MG tablet TAKE 1 TABLET (5 MG TOTAL) BY MOUTH AT BEDTIME DAILY 08/30/14   [name], MD  Artificial Tear Ointment (REFRESH P.M. OP) Place 1 application into the right eye daily.    Historical Provider, MD  B Complex-C-Folic Acid (RENA-VITE PO) Take 1 tablet by mouth daily with breakfast.     Historical Provider, MD  Calcium Carbonate Antacid (TUMS ULTRA 1000 PO) Take 2,000 mg by mouth 3 (three) times daily with meals.     Historical Provider, MD  fluticasone (FLONASE) 50 MCG/ACT nasal spray Place 2 sprays into both nostrils daily. Patient not taking: Reported on 08/15/2014 06/13/14   [name], MD  pantoprazole (PROTONIX) 40 MG tablet Take 1 tablet (40 mg total) by mouth daily. 06/27/14   [name], MD  simvastatin (ZOCOR) 20 MG tablet TAKE ONE TABLET BY MOUTH AT BEDTIME 12/11/13   [name], MD     Allergies  Allergen Reactions  . Sulfa Antibiotics Rash    Social History:  reports that she quit smoking about 61 years ago. Her smoking use included Cigarettes. She quit after 2 years of use. She has quit using smokeless tobacco. She reports that she drinks alcohol. She reports that she does not use illicit drugs.    Family History  Problem Relation Age of Onset  . Heart disease Brother     Congenital  . Hypertension Brother   . Diabetes Brother   . Cancer Father     stomach  . CAD Father     MI at age 66  . Heart disease Father   . Heart attack Father   .  Cancer Sister     female (uterus?)  . Hypertension Mother        Physical Exam:  GEN:  Pleasant  Elderly Obese  81 y.o. Caucasian female examined and in no acute distress; cooperative with exam Filed Vitals:   09/23/14 0100 09/23/14 0130 09/23/14 0142 09/23/14 0143  BP: 157/49 157/47    Pulse: 86 86 48 50  Temp:      TempSrc:      Resp: 23 20 22 22   SpO2: 94% 93% 98% 81%   Blood pressure 157/47, pulse 50, temperature 98 F (36.7 C), temperature source Oral, resp. rate 22, SpO2 81 %. PSYCH: She is alert and oriented x4; does not appear anxious does not appear depressed; affect is normal HEENT:  Normocephalic and Atraumatic, Mucous membranes pink; PERRLA; EOM intact; Fundi:  Benign;  No scleral icterus, Nares: Patent, Oropharynx: Clear, Edentulous,    Neck:  FROM, No Cervical Lymphadenopathy nor Thyromegaly or Carotid Bruit; No JVD; Breasts:: Not examined CHEST WALL: No tenderness CHEST: Normal respiration, clear to auscultation bilaterally HEART: Regular rate and rhythm; no murmurs rubs or gallops BACK: No kyphosis or scoliosis; No CVA tenderness ABDOMEN: Positive Bowel Sounds, Obese, Soft Non-Tender, No Rebound or Guarding; No Masses, No Organomegaly\ Rectal Exam: Not done EXTREMITIES: No Cyanosis, Clubbing, 2+ Edema; No Ulcerations. Genitalia: not examined PULSES: 2+ and symmetric SKIN: Normal hydration no rash or ulceration CNS:  Alert and Oriented x 4, Chronic Right Facial Droop and Ptosis  ( from Birth Injury) and No Other Focal Deficits Vascular: pulses palpable throughout    Labs on Admission:  Basic Metabolic Panel:  Recent Labs Lab 09/23/14 0004  NA 137  K 5.9*  CL 96*  CO2 25  GLUCOSE 150*  BUN 76*  CREATININE 7.11*  CALCIUM 7.8*   Liver Function Tests: No results for input(s): AST, ALT, ALKPHOS, BILITOT, PROT, ALBUMIN in the last 168 hours. No results for input(s): LIPASE, AMYLASE in the last 168 hours. No results for input(s): AMMONIA in the last 168  hours. CBC:  Recent Labs Lab 09/22/14 2355  WBC 7.4  NEUTROABS 5.1  HGB 5.9*  HCT 18.7*  MCV 90.8  PLT 301   Cardiac Enzymes:  Recent Labs Lab 09/22/14 2355  TROPONINI 0.13*    BNP (last 3 results) No results for input(s): BNP in the last 8760 hours.  ProBNP (last 3 results) No results for input(s): PROBNP in the last 8760 hours.  CBG: No results for input(s): GLUCAP in the last 168 hours.  Radiological Exams on Admission: Dg Chest Port 1 View  09/22/2014   CLINICAL DATA:  Shortness of breath and fatigue. History of hypertension, dialysis patient. History of breast cancer.  EXAM: PORTABLE CHEST - 1 VIEW  COMPARISON:  Chest radiograph October 23, 2013  FINDINGS: The cardiac silhouette appears mildly enlarged. Mediastinal silhouette is nonsuspicious, mildly calcified aortic knob. Persistently elevated RIGHT hemidiaphragm. Rounded density projects in RIGHT middle lobe, and was present previously. Mild interstitial prominence, pulmonary vascular congestion without pleural effusion. No pneumothorax. Soft tissue planes and included osseous structures are nonsuspicious. Vascular stent LEFT upper extremity. Coarse calcifications project in LEFT breast.  IMPRESSION: Mild cardiomegaly. Increasing interstitial prominence likely represents pulmonary edema, less likely atypical infection.  Rounded nodular density projecting RIGHT middle lobe may reflect prominent vessel, similar.   Electronically Signed   By: Elon Alas M.D.   On: 09/22/2014 23:51     EKG: Independently reviewed. Sinus Rhythm with Ischemic Changes in the Inferior and lateral Leads.      Assessment/Plan:   81 y.o. female with  Principal Problem:   1.    Acute respiratory failure with hypoxia- due to #3   NCO2 at 4 liters     BiPAP PRN   Active Problems:   2.    Symptomatic anemia- Hb =5.9, baseline has been in 7's.      Transfuse 2 units with Dialysis   Check FOBT   Continue PO PPI Rx  unless FOBT+     3.     Acute on chronic diastolic congestive heart failure   Emergent Hemodialysis     4.    Hyperkalemia   Kayexalate PO x 1   And Dialysis     5.  End stage renal disease on dialysis   Dialysis Team notified     6.    History of diabetes mellitus, type II   SSI coverage    Check HbA1C     7.    Hypertension   Monitor BPs   Continue Amlodipine as BP Tolerates     8.    DVT Prophylaxis    SQ Heparin          Code Status:     FULL CODE      Family Communication:   Family at Bedside    Disposition Plan:    Inpatient  Status        Time spent:  [facility] Pager [phone]   If [place] Please Contact the Day Rounding Team MD for [facility]  If 7PM-7AM, Please Contact Night-Floor Coverage  [email or web address] Password [email or web address] 09/23/2014, 2:03 AM     ADDENDUM:   Patient was seen and examined on 09/23/2014

## 2014-09-23 NOTE — ED Provider Notes (Addendum)
CSN: [id number]     Arrival date & time 09/22/14  2133 History   First MD Initiated Contact with Patient 09/22/14 2208     Chief Complaint  Patient presents with  . Shortness of Breath  . Fatigue     (Consider location/radiation/quality/duration/timing/severity/associated sxs/prior Treatment) HPI The patient has been having shortness of breath particularly with exertion for the past couple of days. She reports this is atypical for her. She has been having to take periods of rest with minor activity. She is also experiencing at times diaphoresis. Patient has had no chest pain. She denies that she has had pain in her calves or increased swelling. She does go to dialysis Monday Wednesday Friday. The patient is due for dialysis tomorrow. Today the patient just has not felt well. Her companion reports that this evening she appeared short of breath even at rest. She denies that she has had any fevers, chills or cough or sputum production. Past Medical History  Diagnosis Date  . Hyperlipidemia   . Hypertension   . Anxiety   . Facial droop 1935    acquired during forceps delivery, some residual R visual loss  . Anemia in chronic kidney disease     IV iron infusion  . Hx of breast cancer 2004  . Arthritis     mild in lower back (Drs Posey Pronto and Joelyn Oms)  . End stage renal disease on dialysis     HD M,W,F [place] AVF now LUE AVF  . GERD (gastroesophageal reflux disease)   . Cardiac murmur     a. thought due to AVF (2D echo 06/2012 without significant valvular disease).   . Bradycardia     a. Noted 06/2012.  Marland Kitchen Hypotension     a. Associated w/ dialysis.  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 [name])  . Osteoporosis 11/2013    DEXA -3.4 radius  . Cancer    Past Surgical History  Procedure Laterality Date  . Cataract extraction  1981  . Av fistula placement  07/20/10    Right brachiocephalic AVF  . Umbilical hernia repair  2011  . Dexa  2013     [facility]  . US echocardiography  06/2012    LVH, EF 65%, grade 1 diastol dysfunction, mod dliated LAD  . Eye surgery  1966    regular cataract  . Breast lumpectomy Left   . Breast biopsy Left   . Bascilic vein transposition Left 07/24/2013    Procedure: BASILIC VEIN TRANSPOSITION;  Surgeon: [name], MD;  Location: [place];  Service: Vascular;  Laterality: Left;  . Shuntogram N/A 04/16/2011    Procedure: Earney Mallet;  Surgeon: [name], MD;  Location: [facility] CATH LAB;  Service: Cardiovascular;  Laterality: N/A;  . Shuntogram Right 07/05/2013    Procedure: FISTULOGRAM;  Surgeon: [name], MD;  Location: [facility] CATH LAB;  Service: Cardiovascular;  Laterality: Right;  . Esophagogastroduodenoscopy N/A 04/09/2014    Procedure: ESOPHAGOGASTRODUODENOSCOPY (EGD);  Surgeon: [name], MD;  Location: [place];  Service: Endoscopy;  Laterality: N/A;  . Peripheral vascular catheterization N/A 08/15/2014    Procedure: A/V Shuntogram/Fistulagram;  Surgeon: [name], MD;  Location: [place] CV LAB;  Service: Cardiovascular;  Laterality: N/A;   Family History  Problem Relation Age of Onset  . Heart disease Brother     Congenital  . Hypertension Brother   . Diabetes Brother   . Cancer Father  stomach  . CAD Father     MI at age 71  . Heart disease Father   . Heart attack Father   . Cancer Sister     female (uterus?)  . Hypertension Mother    History  Substance Use Topics  . Smoking status: Former Smoker -- 2 years    Types: Cigarettes    Quit date: 03/29/1953  . Smokeless tobacco: Former Systems developer  . Alcohol Use: Yes     Comment: occasional   OB History    No data available     Review of Systems 10 Systems reviewed and are negative for acute change except as noted in the HPI.    Allergies  Sulfa antibiotics  Home Medications   Prior to Admission medications   Medication Sig Start Date End Date Taking? Authorizing Provider  amLODipine (NORVASC) 5 MG tablet  TAKE 1 TABLET (5 MG TOTAL) BY MOUTH AT BEDTIME DAILY 08/30/14   [name], MD  Artificial Tear Ointment (REFRESH P.M. OP) Place 1 application into the right eye daily.    Historical Provider, MD  B Complex-C-Folic Acid (RENA-VITE PO) Take 1 tablet by mouth daily with breakfast.     Historical Provider, MD  Calcium Carbonate Antacid (TUMS ULTRA 1000 PO) Take 2,000 mg by mouth 3 (three) times daily with meals.     Historical Provider, MD  fluticasone (FLONASE) 50 MCG/ACT nasal spray Place 2 sprays into both nostrils daily. Patient not taking: Reported on 08/15/2014 06/13/14   [name], MD  pantoprazole (PROTONIX) 40 MG tablet Take 1 tablet (40 mg total) by mouth daily. 06/27/14   [name], MD  simvastatin (ZOCOR) 20 MG tablet TAKE ONE TABLET BY MOUTH AT BEDTIME 12/11/13   [name], MD   BP 144/43 mmHg  Pulse 78  Temp(Src) 98 F (36.7 C) (Oral)  Resp 24  SpO2 92% Physical Exam  Constitutional: She is oriented to person, place, and time. She appears well-developed and well-nourished.  HENT:  Head: Atraumatic.  Right facial paralysis.  Eyes: EOM are normal. Pupils are equal, round, and reactive to light.  Neck: Neck supple.  Cardiovascular: Normal rate, regular rhythm, normal heart sounds and intact distal pulses.   Pulmonary/Chest: Effort normal.  Find basilar rales.  Abdominal: Soft. Bowel sounds are normal. She exhibits no distension. There is no tenderness.  Genitourinary:  Thin brown stool in the vault. No melanotic.  Musculoskeletal: Normal range of motion. She exhibits no edema or tenderness.  Neurological: She is alert and oriented to person, place, and time. She has normal strength. A cranial nerve deficit is present. Coordination normal. GCS eye subscore is 4. GCS verbal subscore is 5. GCS motor subscore is 6.  Patient has a chronic palsy of the right face.  Skin: Skin is warm, dry and intact.  Psychiatric: She has a normal mood and affect.    ED Course   Procedures (including critical care time) CRITICAL CARE Performed by: Charlesetta Shanks   Total critical care time: 45  Critical care time was exclusive of separately billable procedures and treating other patients.  Critical care was necessary to treat or prevent imminent or life-threatening deterioration.  Critical care was time spent personally by me on the following activities: development of treatment plan with patient and/or surrogate as well as nursing, discussions with consultants, evaluation of patient's response to treatment, examination of patient, obtaining history from patient or surrogate, ordering and performing treatments and interventions, ordering and review of laboratory studies, ordering  and review of radiographic studies, pulse oximetry and re-evaluation of patient's condition. Labs Review Labs Reviewed  BASIC METABOLIC PANEL - Abnormal; Notable for the following:    Potassium 5.9 (*)    Chloride 96 (*)    Glucose, Bld 150 (*)    BUN 76 (*)    Creatinine, Ser 7.11 (*)    Calcium 7.8 (*)    GFR calc non Af Amer 5 (*)    GFR calc Af Amer 6 (*)    Anion gap 16 (*)    All other components within normal limits  TROPONIN I - Abnormal; Notable for the following:    Troponin I 0.13 (*)    All other components within normal limits  I-STAT TROPOININ, ED - Abnormal; Notable for the following:    Troponin i, poc 0.12 (*)    All other components within normal limits  PROTIME-INR  CBC WITH DIFFERENTIAL/PLATELET    Imaging Review Dg Chest Port 1 View  09/22/2014   CLINICAL DATA:  Shortness of breath and fatigue. History of hypertension, dialysis patient. History of breast cancer.  EXAM: PORTABLE CHEST - 1 VIEW  COMPARISON:  Chest radiograph October 23, 2013  FINDINGS: The cardiac silhouette appears mildly enlarged. Mediastinal silhouette is nonsuspicious, mildly calcified aortic knob. Persistently elevated RIGHT hemidiaphragm. Rounded density projects in RIGHT middle lobe, and  was present previously. Mild interstitial prominence, pulmonary vascular congestion without pleural effusion. No pneumothorax. Soft tissue planes and included osseous structures are nonsuspicious. Vascular stent LEFT upper extremity. Coarse calcifications project in LEFT breast.  IMPRESSION: Mild cardiomegaly. Increasing interstitial prominence likely represents pulmonary edema, less likely atypical infection.  Rounded nodular density projecting RIGHT middle lobe may reflect prominent vessel, similar.   Electronically Signed   By: Elon Alas M.D.   On: 09/22/2014 23:51     EKG Interpretation   Date/Time:  Sunday September 22 2014 21:43:25 EDT Ventricular Rate:  88 PR Interval:  115 QRS Duration: 96 QT Interval:  409 QTC Calculation: 495 R Axis:   -38 Text Interpretation:  Sinus rhythm Borderline short PR interval Left axis  deviation Low voltage, extremity leads Repol abnrm suggests ischemia,  diffuse leads agree Confirmed by Johnney Killian, MD, Jeannie Done [phone]) on 09/22/2014  11:51:24 PM      Consult: Dr. Arnoldo Morale will admit Consults: Cardiology Dr. Wynonia Lawman advises patient will need dialysis prior to Cardiologic intervention.   MDM   Final diagnoses:  Dyspnea  ESRD (end stage renal disease) on dialysis  Acute pulmonary edema  Abnormal EKG   The patient will be admitted with dyspnea most likely secondary to vascular overload with ESRD. Also there are EKG changes consistent with diffuse ischemia. Patient's dyspnea may be also a component of atypical angina. The patient will be admitted for continued rule out of MI as well as management of volume overload/congestive heart failure.  Charlesetta Shanks, MD 09/23/14 1308  Charlesetta Shanks, MD 09/23/14 [phone]

## 2014-09-23 NOTE — Progress Notes (Signed)
Report called to [place], pt transferred in stable condition. No complaints.

## 2014-09-24 ENCOUNTER — Other Ambulatory Visit (HOSPITAL_COMMUNITY): Payer: Medicare Other

## 2014-09-24 ENCOUNTER — Encounter (HOSPITAL_COMMUNITY): Payer: Self-pay | Admitting: Gastroenterology

## 2014-09-24 ENCOUNTER — Inpatient Hospital Stay (HOSPITAL_COMMUNITY): Payer: Medicare Other

## 2014-09-24 DIAGNOSIS — N186 End stage renal disease: Secondary | ICD-10-CM

## 2014-09-24 DIAGNOSIS — R0600 Dyspnea, unspecified: Secondary | ICD-10-CM

## 2014-09-24 DIAGNOSIS — I214 Non-ST elevation (NSTEMI) myocardial infarction: Secondary | ICD-10-CM

## 2014-09-24 DIAGNOSIS — I248 Other forms of acute ischemic heart disease: Secondary | ICD-10-CM

## 2014-09-24 DIAGNOSIS — Z992 Dependence on renal dialysis: Secondary | ICD-10-CM

## 2014-09-24 DIAGNOSIS — D649 Anemia, unspecified: Secondary | ICD-10-CM

## 2014-09-24 LAB — TYPE AND SCREEN
ABO/RH(D): O POS
Antibody Screen: NEGATIVE
UNIT DIVISION: 0
Unit division: 0

## 2014-09-24 LAB — CBC
HEMATOCRIT: 26.7 % — AB (ref 36.0–46.0)
HEMOGLOBIN: 8.7 g/dL — AB (ref 12.0–15.0)
MCH: 29.1 pg (ref 26.0–34.0)
MCHC: 32.6 g/dL (ref 30.0–36.0)
MCV: 89.3 fL (ref 78.0–100.0)
Platelets: 241 10*3/uL (ref 150–400)
RBC: 2.99 MIL/uL — ABNORMAL LOW (ref 3.87–5.11)
RDW: 17.5 % — ABNORMAL HIGH (ref 11.5–15.5)
WBC: 7.5 10*3/uL (ref 4.0–10.5)

## 2014-09-24 LAB — BASIC METABOLIC PANEL
Anion gap: 9 (ref 5–15)
BUN: 39 mg/dL — AB (ref 6–20)
CHLORIDE: 100 mmol/L — AB (ref 101–111)
CO2: 28 mmol/L (ref 22–32)
Calcium: 8.1 mg/dL — ABNORMAL LOW (ref 8.9–10.3)
GLUCOSE: 93 mg/dL (ref 65–99)
POTASSIUM: 3.9 mmol/L (ref 3.5–5.1)
SODIUM: 137 mmol/L (ref 135–145)

## 2014-09-24 LAB — GLUCOSE, CAPILLARY
GLUCOSE-CAPILLARY: 95 mg/dL (ref 65–99)
Glucose-Capillary: 101 mg/dL — ABNORMAL HIGH (ref 65–99)
Glucose-Capillary: 81 mg/dL (ref 65–99)
Glucose-Capillary: 93 mg/dL (ref 65–99)

## 2014-09-24 LAB — TROPONIN I
Troponin I: 0.91 ng/mL (ref <0.031)
Troponin I: 0.51 ng/mL (ref <0.031)

## 2014-09-24 LAB — HEMOGLOBIN A1C
HEMOGLOBIN A1C: 5.4 % (ref 4.8–5.6)
Mean Plasma Glucose: 108 mg/dL

## 2014-09-24 LAB — HEPATITIS B SURFACE ANTIGEN

## 2014-09-24 MED ORDER — AMLODIPINE BESYLATE 2.5 MG PO TABS
2.5000 mg | ORAL_TABLET | Freq: Every day | ORAL | Status: DC
  Administered 2014-09-24: 2.5 mg via ORAL
  Filled 2014-09-24: qty 1

## 2014-09-24 MED ORDER — RENA-VITE PO TABS
1.0000 | ORAL_TABLET | Freq: Every day | ORAL | Status: DC
  Administered 2014-09-24: 1 via ORAL
  Filled 2014-09-24: qty 1

## 2014-09-24 NOTE — Progress Notes (Signed)
SUBJECTIVE:  No chest pain.  No SOB.   PHYSICAL EXAM Filed Vitals:   09/24/14 0350 09/24/14 0400 09/24/14 0409 09/24/14 0801  BP:  146/48  160/68  Pulse:  78 82 71  Temp: 98.8 F (37.1 C)   98.3 F (36.8 C)  TempSrc: Oral   Oral  Resp:  19 13 19   Height:      Weight:      SpO2:  88% 95% 100%   General:  No distress Lungs:  Clear Heart:  Positive bowel sounds, no rebound no guarding Abdomen:  Positive bowel sounds, no rebound no guarding Extremities:  No edema   LABS: Lab Results  Component Value Date   TROPONINI 0.91* 09/24/2014   Results for orders placed or performed during the hospital encounter of 09/22/14 (from the past 24 hour(s))  Glucose, capillary     Status: None   Collection Time: 09/23/14 12:29 PM  Result Value Ref Range   Glucose-Capillary 93 65 - 99 mg/dL   Comment 1 Capillary Specimen   MRSA PCR Screening     Status: None   Collection Time: 09/23/14 12:55 PM  Result Value Ref Range   MRSA by PCR NEGATIVE NEGATIVE  Troponin I (q 6hr x 3)     Status: Abnormal   Collection Time: 09/23/14  1:20 PM  Result Value Ref Range   Troponin I 1.34 (HH) <0.031 ng/mL  Glucose, capillary     Status: None   Collection Time: 09/23/14  5:57 PM  Result Value Ref Range   Glucose-Capillary 95 65 - 99 mg/dL   Comment 1 Capillary Specimen   Glucose, capillary     Status: Abnormal   Collection Time: 09/23/14  8:12 PM  Result Value Ref Range   Glucose-Capillary 127 (H) 65 - 99 mg/dL  Troponin I (q 6hr x 3)     Status: Abnormal   Collection Time: 09/23/14 10:30 PM  Result Value Ref Range   Troponin I 1.26 (HH) <0.031 ng/mL  Glucose, capillary     Status: None   Collection Time: 09/23/14 11:45 PM  Result Value Ref Range   Glucose-Capillary 93 65 - 99 mg/dL   Comment 1 Capillary Specimen   Glucose, capillary     Status: Abnormal   Collection Time: 09/24/14  3:52 AM  Result Value Ref Range   Glucose-Capillary 101 (H) 65 - 99 mg/dL  Basic metabolic panel      Status: Abnormal   Collection Time: 09/24/14  4:13 AM  Result Value Ref Range   Sodium 137 135 - 145 mmol/L   Potassium 3.9 3.5 - 5.1 mmol/L   Chloride 100 (L) 101 - 111 mmol/L   CO2 28 22 - 32 mmol/L   Glucose, Bld 93 65 - 99 mg/dL   BUN 39 (H) 6 - 20 mg/dL   Creatinine, Ser DIALYSIS 0.44 - 1.00 mg/dL   Calcium 8.1 (L) 8.9 - 10.3 mg/dL   GFR calc non Af Amer NOT CALCULATED >60 mL/min   GFR calc Af Amer NOT CALCULATED >60 mL/min   Anion gap 9 5 - 15  CBC     Status: Abnormal   Collection Time: 09/24/14  4:13 AM  Result Value Ref Range   WBC 7.5 4.0 - 10.5 K/uL   RBC 2.99 (L) 3.87 - 5.11 MIL/uL   Hemoglobin 8.7 (L) 12.0 - 15.0 g/dL   HCT 26.7 (L) 36.0 - 46.0 %   MCV 89.3 78.0 - 100.0 fL   MCH  29.1 26.0 - 34.0 pg   MCHC 32.6 30.0 - 36.0 g/dL   RDW 17.5 (H) 11.5 - 15.5 %   Platelets 241 150 - 400 K/uL  Troponin I (q 6hr x 3)     Status: Abnormal   Collection Time: 09/24/14  4:13 AM  Result Value Ref Range   Troponin I 0.91 (HH) <0.031 ng/mL    Intake/Output Summary (Last 24 hours) at 09/24/14 0831 Last data filed at 09/23/14 1246  Gross per 24 hour  Intake    105 ml  Output   2397 ml  Net  -2292 ml     ASSESSMENT AND PLAN:  DEMAND ISCHEMIA:  Troponin trending down.  No heparin or ASA with GI bleeding.  Echo pending.  Plan noninvasive evaluation when she is improved from her current acute illness.   HTN:  BP is up.  I will restart half of her previous dose of Norvasc.   Minus Breeding 09/24/2014 8:31 AM

## 2014-09-24 NOTE — Progress Notes (Signed)
Pt discharge instructions complete.  Pt denies any further questions, comments or concerns. Emotional support given;

## 2014-09-24 NOTE — Discharge Summary (Addendum)
Discharge Summary  [name]:[id number] DOB: 1931/05/17  PCP: [name], MD  Admit date: 09/22/2014 Discharge date: 09/24/2014  Time spent: >14mins  Recommendations for Outpatient Follow-up:  1. F/u with gastroenterology Dr Deatra Ina in three week, earlier if hgb trending down, or frank bleed. 2. F/u with cardiology for outpatient stress test, blood pressure control 3. F/u with nephrology, patient to have cbc checked during dialysis. 4. F/u with pmd for hospital discharge follow up.  Discharge Diagnoses:  Active Hospital Problems   Diagnosis Date Noted  . Acute respiratory failure with hypoxia 09/23/2014  . Demand ischemia   . Acute on chronic diastolic congestive heart failure 09/23/2014  . Hyperkalemia 09/23/2014  . AVM (arteriovenous malformation) of stomach, acquired with hemorrhage 09/23/2014  . Abnormal EKG   . Acute pulmonary edema   . Dyspnea   . NSTEMI (non-ST elevated myocardial infarction)   . Symptomatic anemia 04/08/2014  . End stage renal disease on dialysis   . History of diabetes mellitus, type II   . Hypertension     Resolved Hospital Problems   Diagnosis Date Noted Date Resolved  No resolved problems to display.    Discharge Condition: stable  Diet recommendation: heart healthy/carb modified  Filed Weights   09/23/14 0711 09/23/14 1050 09/23/14 1223  Weight: 54 kg (119 lb 0.8 oz) 51.6 kg (113 lb 12.1 oz) 51.2 kg (112 lb 14 oz)    History of present illness:  [name] is a 81 y.o. female with a history of ESRD on HD usually on MWF, HTN, Anemia, Hyperlipidemia, remote Hx of Breast Cancer who presents to the ED with complaints of worsening SOB and DOE along with Orthopnea x 1 week. She deneis having Chest pain. She was dialyzed on Tuesday, Wednesday and Friday this past week, and deneis missing any dialysis treatments. She was observed to have hypoxia with any exertion while in the ED, and was placed on 4 liters NCO2. A chest  X=Ray revealed CHF changes. She was also found to have Ischemic changes on EKG along with Hyperkalemia and worsening of her anemia. The EDP contacted the Nephrologist on [place] for Emergent Dialysis Treatment along with transfusion due to her ischemia and she was referred to Medicine for [place].    Hospital Course:  Principal Problem:   Acute respiratory failure with hypoxia Active Problems:   History of diabetes mellitus, type II   Hypertension   End stage renal disease on dialysis   Symptomatic anemia   Acute on chronic diastolic congestive heart failure   Hyperkalemia   Abnormal EKG   Acute pulmonary edema   Dyspnea   AVM (arteriovenous malformation) of stomach, acquired with hemorrhage   NSTEMI (non-ST elevated myocardial infarction)   Demand ischemia  Symptomatic anemia due to GI blood loss from GAVE: s/p prbc transfusion x 2unit, s/p EGD with APC, hgb stable, no more active  Bleed. GI recommend close monitor cbc, outpatient follow up with GI in three week, for repeat EGD, call GI earlier if hgb trending down or active bleed. Patient reported has not been taking any NSAIDS in the last 61yrs. Continue ppi.  HTN/volume overload on presentation, required urgent dialysis, resolved. Continue norvasc.  Troponin elevation, likely demand ischemia, repeat echo pending, cardiology consulted, recommended outpatient ischemic work up. Not on asa or other antiplatelet due to GI bleed.  ESRD on HD, close followed by nephrology  HLD; on statin    Procedures:  Emergent HD  EGD  Consultations:  Nephrology  Cardiology  GI  Discharge Exam: BP 151/92 mmHg  Pulse 86  Temp(Src) 98.1 F (36.7 C) (Oral)  Resp 23  Ht 4\' 11"  (1.499 m)  Wt 51.2 kg (112 lb 14 oz)  BMI 22.79 kg/m2  SpO2 95%  General: AAOx3, NAD Cardiovascular: RRR, +murmur Respiratory: CTABL Extremity: no edema  Discharge Instructions You were cared for by a hospitalist during your hospital stay. If you  have any questions about your discharge medications or the care you received while you were in the hospital after you are discharged, you can call the unit and asked to speak with the hospitalist on call if the hospitalist that took care of you is not available. Once you are discharged, your primary care physician will handle any further medical issues. Please note that NO REFILLS for any discharge medications will be authorized once you are discharged, as it is imperative that you return to your primary care physician (or establish a relationship with a primary care physician if you do not have one) for your aftercare needs so that they can reassess your need for medications and monitor your lab values.      Discharge Instructions    Diet - low sodium heart healthy    Complete by:  As directed   Low fat, low salt, carb modified     Increase activity slowly    Complete by:  As directed             Medication List    TAKE these medications        amLODipine 5 MG tablet  Commonly known as:  NORVASC  TAKE 1 TABLET (5 MG TOTAL) BY MOUTH AT BEDTIME DAILY     fluticasone 50 MCG/ACT nasal spray  Commonly known as:  FLONASE  Place 2 sprays into both nostrils daily.     pantoprazole 40 MG tablet  Commonly known as:  PROTONIX  Take 1 tablet (40 mg total) by mouth daily.     REFRESH P.M. OP  Place 1 application into the right eye daily.     RENA-VITE PO  Take 1 tablet by mouth daily with breakfast.     simvastatin 20 MG tablet  Commonly known as:  ZOCOR  TAKE ONE TABLET BY MOUTH AT BEDTIME     TUMS ULTRA 1000 PO  Take 2,000 mg by mouth 3 (three) times daily with meals.       Allergies  Allergen Reactions  . Sulfa Antibiotics Rash   Follow-up Information    Follow up with PATEL,JAY K., MD In 4 weeks.   Specialty:  Nephrology   Why:  hhospital discharge follow up   Contact information:   [place] [address] [phone]       Follow up with [name],  MD In 3 weeks.   Specialty:  Family Medicine   Why:  hospital discharge follow up   Contact information:   [place] [address] [phone]       Follow up with Kirk Ruths, MD In 2 weeks.   Specialty:  Cardiology   Why:  outpatient ischemic work up/stress test/bp Consulting civil engineer information:   [address] [place] [address] [phone]       Follow up with Erskine Emery, MD In 3 weeks.   Specialty:  Gastroenterology   Why:  GI bleed   Contact information:   [address] [place] [address] [phone]  The results of significant diagnostics from this hospitalization (including imaging, microbiology, ancillary and laboratory) are listed below for reference.    Significant Diagnostic Studies: Dg Chest Port 1 View  09/22/2014   CLINICAL DATA:  Shortness of breath and fatigue. History of hypertension, dialysis patient. History of breast cancer.  EXAM: PORTABLE CHEST - 1 VIEW  COMPARISON:  Chest radiograph October 23, 2013  FINDINGS: The cardiac silhouette appears mildly enlarged. Mediastinal silhouette is nonsuspicious, mildly calcified aortic knob. Persistently elevated RIGHT hemidiaphragm. Rounded density projects in RIGHT middle lobe, and was present previously. Mild interstitial prominence, pulmonary vascular congestion without pleural effusion. No pneumothorax. Soft tissue planes and included osseous structures are nonsuspicious. Vascular stent LEFT upper extremity. Coarse calcifications project in LEFT breast.  IMPRESSION: Mild cardiomegaly. Increasing interstitial prominence likely represents pulmonary edema, less likely atypical infection.  Rounded nodular density projecting RIGHT middle lobe may reflect prominent vessel, similar.   Electronically Signed   By: Elon Alas M.D.   On: 09/22/2014 23:51    Microbiology: Recent Results (from the past 240 hour(s))  MRSA PCR Screening     Status: None   Collection  Time: 09/23/14 12:55 PM  Result Value Ref Range Status   MRSA by PCR NEGATIVE NEGATIVE Final    Comment:        The GeneXpert MRSA Assay (FDA approved for NASAL specimens only), is one component of a comprehensive MRSA colonization surveillance program. It is not intended to diagnose MRSA infection nor to guide or monitor treatment for MRSA infections.      Labs: Basic Metabolic Panel:  Recent Labs Lab 09/23/14 0004 09/23/14 0434 09/23/14 0726 09/24/14 0413  NA 137 138 138 137  K 5.9* 6.0* 5.6* 3.9  CL 96* 99* 100* 100*  CO2 25 26 25 28   GLUCOSE 150* 154* 137* 93  BUN 76* 83* 87* 39*  CREATININE 7.11* 7.50* 7.47* DIALYSIS  CALCIUM 7.8* 8.2* 8.2* 8.1*  PHOS  --   --  5.1*  --    Liver Function Tests:  Recent Labs Lab 09/23/14 0726  ALBUMIN 3.3*   No results for input(s): LIPASE, AMYLASE in the last 168 hours. No results for input(s): AMMONIA in the last 168 hours. CBC:  Recent Labs Lab 09/22/14 2355 09/23/14 0434 09/23/14 0726 09/24/14 0413  WBC 7.4 8.2 7.2 7.5  NEUTROABS 5.1  --   --   --   HGB 5.9* 5.6* 8.1* 8.7*  HCT 18.7* 17.6* 25.3* 26.7*  MCV 90.8 91.7 88.5 89.3  PLT 301 287 272 241   Cardiac Enzymes:  Recent Labs Lab 09/23/14 0427 09/23/14 1320 09/23/14 2230 09/24/14 0413 09/24/14 1104  TROPONINI 0.21* 1.34* 1.26* 0.91* 0.51*   BNP: BNP (last 3 results) No results for input(s): BNP in the last 8760 hours.  ProBNP (last 3 results) No results for input(s): PROBNP in the last 8760 hours.  CBG:  Recent Labs Lab 09/23/14 2012 09/23/14 2345 09/24/14 0352 09/24/14 0759 09/24/14 1209  GLUCAP 127* 93 101* 81 95       Signed:  Darrie Macmillan MD, PhD  [facility] 09/24/2014, 2:03 PM

## 2014-09-24 NOTE — Progress Notes (Signed)
    Progress Note   Subjective  no complaints.    Objective   Vital signs in last 24 hours: Temp:  [97.4 F (36.3 C)-99.1 F (37.3 C)] 98.3 F (36.8 C) (06/28 0801) Pulse Rate:  [68-86] 71 (06/28 0801) Resp:  [13-27] 19 (06/28 0801) BP: (98-179)/(26-68) 160/68 mmHg (06/28 0801) SpO2:  [88 %-100 %] 100 % (06/28 0801) Weight:  [112 lb 14 oz (51.2 kg)-113 lb 12.1 oz (51.6 kg)] 112 lb 14 oz (51.2 kg) (06/27 1223) Last BM Date: 09/24/14 General:    Pleasant white female in NAD Abdomen:  Soft, nontender and nondistended. Normal bowel sounds. Extremities:  Without edema. Neurologic:  Alert and oriented,  grossly normal neurologically. Psych:  Cooperative. Normal mood and affect.  Lab Results:  Recent Labs  09/23/14 0434 09/23/14 0726 09/24/14 0413  WBC 8.2 7.2 7.5  HGB 5.6* 8.1* 8.7*  HCT 17.6* 25.3* 26.7*  PLT 287 272 241    ENDOSCOPIC IMPRESSION: 1. Innumerable number of arteriovenous malformations were found in the gastric antrum; Argon plasma coagulation was applied to the sites; with slower bleeding resulting with partial hemostasis achiev   Assessment / Plan:    81 year old female with recurrent anemia secondary to GAVE, s/p EGD with APC yesterday. Hgb up from 5.9 to 6.7 post 2 units of blood yesterday. Continue daily PPI at home. Hgb monitored at dialysis. Lengthy discussion with patient about about hgb trends. Hgb apparently 12 in May. She will ask Nephrology for results of weekly hgb and knows to call us if hgb consistently declining or of course if overt bleeding. Our office will arrange for repeat EGD in 3 weeks to see if ablation of GAVE complete.    LOS: 1 day   Tye Savoy  09/24/2014, 9:15 AM

## 2014-09-24 NOTE — Progress Notes (Signed)
  [facility] Progress Note   Subjective:   Filed Vitals:   09/24/14 0350 09/24/14 0400 09/24/14 0409 09/24/14 0801  BP:  146/48  160/68  Pulse:  78 82 71  Temp: 98.8 F (37.1 C)   98.3 F (36.8 C)  TempSrc: Oral   Oral  Resp:  19 13 19   Height:      Weight:      SpO2:  88% 95% 100%   Exam: Alert, no distress, calm No jvd Chest clear bilat RRR no MRG Abd soft ntnd no mass or ascites No LE edema, wounds or ulcers Neuro nonfocal motor , Ox 3  [facility] MWF   3h  [place]ath 2.2  50kg  Heparin 2000  LUA AVF Mircera 150 every 2, last 6/25 No fe/ D      Assessment: 1. Anemia/ GIB / gastric AVM's - s/p endoscopic Rx, for GI f/u 3 weeks 2. EKG changes/ demand ischemia - per cards will do stress test later as outpatient  3. ESRD HD MWF 4. Anemia max ESA Hb 5 > 8 post-transfusion 5. MBD stable 6. Dispo - per primary  Plan - will follow    Kelly Splinter MD  pager [phone]    cell [phone]  09/24/2014, 10:48 AM     Recent Labs Lab 09/23/14 0434 09/23/14 0726 09/24/14 0413  NA 138 138 137  K 6.0* 5.6* 3.9  CL 99* 100* 100*  CO2 26 25 28   GLUCOSE 154* 137* 93  BUN 83* 87* 39*  CREATININE 7.50* 7.47* DIALYSIS  CALCIUM 8.2* 8.2* 8.1*  PHOS  --  5.1*  --     Recent Labs Lab 09/23/14 0726  ALBUMIN 3.3*    Recent Labs Lab 09/22/14 2355 09/23/14 0434 09/23/14 0726 09/24/14 0413  WBC 7.4 8.2 7.2 7.5  NEUTROABS 5.1  --   --   --   HGB 5.9* 5.6* 8.1* 8.7*  HCT 18.7* 17.6* 25.3* 26.7*  MCV 90.8 91.7 88.5 89.3  PLT 301 287 272 241   . amLODipine  2.5 mg Oral Daily  . calcium carbonate  2,000 mg Oral TID WC  . darbepoetin (ARANESP) injection - DIALYSIS  200 mcg Intravenous Q Mon-HD  . ferric gluconate (FERRLECIT/NULECIT) IV  62.5 mg Intravenous Q M,W,F-HD  . [START ON 10/09/2014] ferric gluconate (FERRLECIT/NULECIT) IV  62.5 mg Intravenous Weekly  . insulin aspart  0-9 Units Subcutaneous 6 times per day  . multivitamin  1 tablet Oral Daily  .  pantoprazole  40 mg Oral BID  . polyvinyl alcohol   Right Eye Daily  . simvastatin  20 mg Oral QHS  . sodium chloride  3 mL Intravenous Q12H     sodium chloride, acetaminophen **OR** [DISCONTINUED] acetaminophen, fluticasone, HYDROmorphone (DILAUDID) injection, ondansetron **OR** ondansetron (ZOFRAN) IV, oxyCODONE, sodium chloride

## 2014-09-24 NOTE — Progress Notes (Signed)
*  PRELIMINARY RESULTS* Echocardiogram 2D Echocardiogram has been performed.  [name] 09/24/2014, 11:06 AM

## 2014-09-25 ENCOUNTER — Telehealth: Payer: Self-pay | Admitting: Family Medicine

## 2014-09-25 DIAGNOSIS — E1129 Type 2 diabetes mellitus with other diabetic kidney complication: Secondary | ICD-10-CM | POA: Diagnosis not present

## 2014-09-25 DIAGNOSIS — N186 End stage renal disease: Secondary | ICD-10-CM | POA: Diagnosis not present

## 2014-09-25 DIAGNOSIS — D631 Anemia in chronic kidney disease: Secondary | ICD-10-CM | POA: Diagnosis not present

## 2014-09-25 NOTE — Telephone Encounter (Signed)
Recent hospitalization for acute blood loss anemia s/p GI workup. Also had demand ischemia and needed hemodialysis during hospital. plz call tomorrow for hosp f/u phone call and schedule appt in next 2 weeks.

## 2014-09-26 ENCOUNTER — Encounter: Payer: Self-pay | Admitting: Physician Assistant

## 2014-09-26 DIAGNOSIS — E1129 Type 2 diabetes mellitus with other diabetic kidney complication: Secondary | ICD-10-CM | POA: Diagnosis not present

## 2014-09-26 DIAGNOSIS — N186 End stage renal disease: Secondary | ICD-10-CM | POA: Diagnosis not present

## 2014-09-26 DIAGNOSIS — Z992 Dependence on renal dialysis: Secondary | ICD-10-CM | POA: Diagnosis not present

## 2014-09-26 NOTE — Telephone Encounter (Signed)
Transition Care Management Follow-up Telephone Call  How have you been since you were released from the hospital? Patient reports she is doing well.   Do you understand why you were in the hospital? yes   Do you understand the discharge instrcutions? yes   Where were you discharged to? Home   Items Reviewed:  Medications reviewed: yes  Allergies reviewed: yes  Dietary changes reviewed: no  Referrals reviewed: yes   Functional Questionnaire:   Activities of Daily Living (ADLs):   She states they are independent in the following: ambulation, bathing and hygiene, feeding, continence, grooming, toileting and dressing States they require assistance with the following: None   Any transportation issues/concerns?: no   Any patient concerns? no   Confirmed importance and date/time of follow-up visits scheduled: yes  Confirmed with patient if condition begins to worsen call PCP or go to the ER.  Patient was given the office number and encouraged to call back with question or concerns: yes

## 2014-09-26 NOTE — Telephone Encounter (Signed)
Patient returned Gina's call.  She said she's feeling better and she has an appointment for hospital follow up with Dr.Gutierrez on 10/03/14.

## 2014-09-27 ENCOUNTER — Other Ambulatory Visit: Payer: Self-pay

## 2014-09-27 ENCOUNTER — Telehealth: Payer: Self-pay

## 2014-09-27 DIAGNOSIS — E1129 Type 2 diabetes mellitus with other diabetic kidney complication: Secondary | ICD-10-CM | POA: Diagnosis not present

## 2014-09-27 DIAGNOSIS — D509 Iron deficiency anemia, unspecified: Secondary | ICD-10-CM | POA: Diagnosis not present

## 2014-09-27 DIAGNOSIS — N2581 Secondary hyperparathyroidism of renal origin: Secondary | ICD-10-CM | POA: Diagnosis not present

## 2014-09-27 DIAGNOSIS — N186 End stage renal disease: Secondary | ICD-10-CM | POA: Diagnosis not present

## 2014-09-27 DIAGNOSIS — D62 Acute posthemorrhagic anemia: Secondary | ICD-10-CM

## 2014-09-27 NOTE — Telephone Encounter (Signed)
-----   Message from Willia Craze, NP sent at 09/26/2014  4:12 PM EDT ----- 2 weeks is fine. Thanks ----- Message -----    From: Greggory Keen, LPN    Sent: 1/59/5396   2:49 PM      To: Willia Craze, NP  I can get it scheduled for 10/09/14 which is 2 weeks or 10/29/14 which is 5 weeks. ----- Message -----    From: Marlon Pel, RN    Sent: 09/24/2014  10:53 AM      To: Greggory Keen, LPN  I hate to send this to you but this will need to be at the hospital.    Barbera Setters  ----- Message -----    From: Willia Craze, NP    Sent: 09/24/2014  10:27 AM      To: Marlon Pel, RN  Sheri, will you or Kaplan's nurse arrange for EGD with possible ablation of GAVE in 3 weeks with Deatra Ina. Thanks

## 2014-09-27 NOTE — Telephone Encounter (Signed)
Spoke with the patient to schedule the procedure. She declines appointments offered (7/13,7/27/8/2). Agrees to 11/12/14 at 12:30. Written instructions will be mailed to her per her request.

## 2014-09-30 DIAGNOSIS — N186 End stage renal disease: Secondary | ICD-10-CM | POA: Diagnosis not present

## 2014-09-30 DIAGNOSIS — N2581 Secondary hyperparathyroidism of renal origin: Secondary | ICD-10-CM | POA: Diagnosis not present

## 2014-09-30 DIAGNOSIS — D509 Iron deficiency anemia, unspecified: Secondary | ICD-10-CM | POA: Diagnosis not present

## 2014-09-30 DIAGNOSIS — E1129 Type 2 diabetes mellitus with other diabetic kidney complication: Secondary | ICD-10-CM | POA: Diagnosis not present

## 2014-10-02 DIAGNOSIS — D509 Iron deficiency anemia, unspecified: Secondary | ICD-10-CM | POA: Diagnosis not present

## 2014-10-02 DIAGNOSIS — N2581 Secondary hyperparathyroidism of renal origin: Secondary | ICD-10-CM | POA: Diagnosis not present

## 2014-10-02 DIAGNOSIS — N186 End stage renal disease: Secondary | ICD-10-CM | POA: Diagnosis not present

## 2014-10-02 DIAGNOSIS — E1129 Type 2 diabetes mellitus with other diabetic kidney complication: Secondary | ICD-10-CM | POA: Diagnosis not present

## 2014-10-03 ENCOUNTER — Ambulatory Visit (INDEPENDENT_AMBULATORY_CARE_PROVIDER_SITE_OTHER): Payer: Medicare Other | Admitting: Family Medicine

## 2014-10-03 ENCOUNTER — Encounter: Payer: Self-pay | Admitting: Family Medicine

## 2014-10-03 VITALS — BP 136/70 | HR 80 | Temp 97.7°F | Wt 109.8 lb

## 2014-10-03 DIAGNOSIS — R011 Cardiac murmur, unspecified: Secondary | ICD-10-CM

## 2014-10-03 DIAGNOSIS — N186 End stage renal disease: Secondary | ICD-10-CM | POA: Diagnosis not present

## 2014-10-03 DIAGNOSIS — K31819 Angiodysplasia of stomach and duodenum without bleeding: Secondary | ICD-10-CM

## 2014-10-03 DIAGNOSIS — I248 Other forms of acute ischemic heart disease: Secondary | ICD-10-CM | POA: Diagnosis not present

## 2014-10-03 DIAGNOSIS — Z992 Dependence on renal dialysis: Secondary | ICD-10-CM

## 2014-10-03 DIAGNOSIS — D500 Iron deficiency anemia secondary to blood loss (chronic): Secondary | ICD-10-CM

## 2014-10-03 DIAGNOSIS — I150 Renovascular hypertension: Secondary | ICD-10-CM

## 2014-10-03 DIAGNOSIS — I214 Non-ST elevation (NSTEMI) myocardial infarction: Secondary | ICD-10-CM

## 2014-10-03 NOTE — Progress Notes (Signed)
BP 136/70 mmHg  Pulse 80  Temp(Src) 97.7 F (36.5 C) (Oral)  Wt 109 lb 12 oz (49.782 kg)   CC: hospital f/u visit  Subjective:    Patient ID: [name], female    DOB: 1931-11-27, 81 y.o.   MRN: [id number]  HPI: [name] is a 81 y.o. female presenting on 10/03/2014 for Follow-up   [name] presents today for hospitalization follow up visit - recently hospitalized for worsening dyspnea (DOE and orthopnea, no chest pain) found to have acute anemia from GI bleed from GAVE (with Hgb 5.9) and cardiac ischemia thought demand ischemia (troponins peaked at 1.3). Underwent emergent dialysis. Received 2 u pRBC. Underwent EGD with APC. rec f/u with GI Dr Deatra Ina in 3 wks. rec outpatient workup for cardiac ischemia. Had CBC checked yesterday, will find out results tomorrow - will call me with results.  Has f/u with nephrology (Dr Posey Pronto ongoing at HD) and cardiology (Dr Stanford Breed Aug 2nd). Has f/u with GI (Dr Deatra Ina 10/12/2014)  Since discharge, feeling much better. Denies sxs of active bleed. No further dyspnea. No chest pain. Notices more trouble with sleeping.   Asks about continued exercises (hula hoops and squats and stretching). I rec stop hula hoop and squats.   Lab Results  Component Value Date   HGB 8.7* 09/24/2014   Echo 08/2014 - EF 50%, mild inferolateral hypokinesis. Mild LVH. Aortic sclerosis without stenosis. LA mod to severe dilation, RV mildly dilated, RA mod dilation.   Admit date: 09/22/2014 Discharge date: 09/24/2014 F/u phone call: 09/26/2014  Recommendations for Outpatient Follow-up:  1. F/u with gastroenterology Dr Deatra Ina in three week, earlier if hgb trending down, or frank bleed. 2. F/u with cardiology for outpatient stress test, blood pressure control 3. F/u with nephrology, patient to have cbc checked during dialysis. 4. F/u with pmd for hospital discharge follow up.  Relevant past medical, surgical, family and social history reviewed and updated as indicated.  Interim medical history since our last visit reviewed. Allergies and medications reviewed and updated. Current Outpatient Prescriptions on File Prior to Visit  Medication Sig  . amLODipine (NORVASC) 5 MG tablet TAKE 1 TABLET (5 MG TOTAL) BY MOUTH AT BEDTIME DAILY  . Artificial Tear Ointment (REFRESH P.M. OP) Place 1 application into the right eye daily.  . B Complex-C-Folic Acid (RENA-VITE PO) Take 1 tablet by mouth daily with breakfast.   . Calcium Carbonate Antacid (TUMS ULTRA 1000 PO) Take 2,000 mg by mouth 3 (three) times daily with meals.   . pantoprazole (PROTONIX) 40 MG tablet Take 1 tablet (40 mg total) by mouth daily.  . simvastatin (ZOCOR) 20 MG tablet TAKE ONE TABLET BY MOUTH AT BEDTIME  . fluticasone (FLONASE) 50 MCG/ACT nasal spray Place 2 sprays into both nostrils daily. (Patient not taking: Reported on 10/03/2014)   No current facility-administered medications on file prior to visit.    Review of Systems Per HPI unless specifically indicated above     Objective:    BP 136/70 mmHg  Pulse 80  Temp(Src) 97.7 F (36.5 C) (Oral)  Wt 109 lb 12 oz (49.782 kg)  Wt Readings from Last 3 Encounters:  10/03/14 109 lb 12 oz (49.782 kg)  09/23/14 112 lb 14 oz (51.2 kg)  08/15/14 110 lb (49.896 kg)    Physical Exam  Constitutional: She appears well-developed and well-nourished. No distress.  HENT:  Mouth/Throat: Oropharynx is clear and moist. No oropharyngeal exudate.  Cardiovascular: Normal rate, regular rhythm and intact distal pulses.  Murmur (SEM best at LUSB (aortic sclerosis by echo)) heard. Pulmonary/Chest: Effort normal and breath sounds normal. No respiratory distress. She has no wheezes. She has no rales.  Musculoskeletal: She exhibits no edema.  Neurological:  Chronic R facial droop  Skin: Skin is warm and dry. No rash noted.  Psychiatric: She has a normal mood and affect.  Nursing note and vitals reviewed.     Assessment & Plan:   Problem List Items  Addressed This Visit    Anemia due to blood loss, chronic - Primary    Recent EGD with APC for GAVE by Dr Deatra Ina during latest hospitalization, has f/u in 2 weeks.  Discussed sxs to monitor for worsening anemia (dyspnea, fatigue, syncope, etc)      Demand ischemia    With elevated troponins - NSTEMI during recent hospitalization. S/p 2u pRBC and TnI dropped prior to discharge to 0.5. Has f/u scheduled with cardiology for early next month. Advised against significant exertion until further eval by cards. Denies active sxs currently.      End stage renal disease on dialysis    HD MWF at [facility]. Will call me with latest Hgb tomorrow.      GAVE (gastric antral vascular ectasia)    Continue PPI daily (protonix). Avoid ASA for now.      Hypertension    Chronic, stable. Continue amlodipine 5mg  daily.      NSTEMI (non-ST elevated myocardial infarction)    F/u with cards. Avoid ASA for now. Continue statin and bp control.      Systolic murmur    Aortic sclerosis without stenosis by recent echo 08/2014 during hospitalization.          Follow up plan: Return in about 3 months (around 01/03/2015), or as needed, for follow up visit.

## 2014-10-03 NOTE — Progress Notes (Signed)
Pre visit review using our clinic review tool, if applicable. No additional management support is needed unless otherwise documented below in the visit note. 

## 2014-10-03 NOTE — Assessment & Plan Note (Signed)
F/u with cards. Avoid ASA for now. Continue statin and bp control.

## 2014-10-03 NOTE — Assessment & Plan Note (Signed)
Recent EGD with APC for GAVE by Dr Deatra Ina during latest hospitalization, has f/u in 2 weeks.  Discussed sxs to monitor for worsening anemia (dyspnea, fatigue, syncope, etc)

## 2014-10-03 NOTE — Assessment & Plan Note (Signed)
Aortic sclerosis without stenosis by recent echo 08/2014 during hospitalization.

## 2014-10-03 NOTE — Assessment & Plan Note (Signed)
Chronic, stable. Continue amlodipine 5mg daily. 

## 2014-10-03 NOTE — Patient Instructions (Addendum)
I think you are doing well. Ok to do stretches but no hula hoop or squats until seen by Dr Stanford Breed.  Let us know latest hemoglobin level.  Return as needed or in 3 months for follow up.  May try melatonin over the counter for sleep. Let me know effect.

## 2014-10-03 NOTE — Assessment & Plan Note (Signed)
HD MWF at [facility]. Will call me with latest Hgb tomorrow.

## 2014-10-03 NOTE — Assessment & Plan Note (Signed)
Continue PPI daily (protonix). Avoid ASA for now.

## 2014-10-03 NOTE — Assessment & Plan Note (Signed)
With elevated troponins - NSTEMI during recent hospitalization. S/p 2u pRBC and TnI dropped prior to discharge to 0.5. Has f/u scheduled with cardiology for early next month. Advised against significant exertion until further eval by cards. Denies active sxs currently.

## 2014-10-04 ENCOUNTER — Telehealth: Payer: Self-pay | Admitting: *Deleted

## 2014-10-04 DIAGNOSIS — N2581 Secondary hyperparathyroidism of renal origin: Secondary | ICD-10-CM | POA: Diagnosis not present

## 2014-10-04 DIAGNOSIS — D509 Iron deficiency anemia, unspecified: Secondary | ICD-10-CM | POA: Diagnosis not present

## 2014-10-04 DIAGNOSIS — E1129 Type 2 diabetes mellitus with other diabetic kidney complication: Secondary | ICD-10-CM | POA: Diagnosis not present

## 2014-10-04 DIAGNOSIS — N186 End stage renal disease: Secondary | ICD-10-CM | POA: Diagnosis not present

## 2014-10-04 NOTE — Telephone Encounter (Signed)
Patient called and said Hgb was 8.8 on 10/02/14.

## 2014-10-04 NOTE — Telephone Encounter (Signed)
Noted. Stable. Thanks.

## 2014-10-07 DIAGNOSIS — D509 Iron deficiency anemia, unspecified: Secondary | ICD-10-CM | POA: Diagnosis not present

## 2014-10-07 DIAGNOSIS — E1129 Type 2 diabetes mellitus with other diabetic kidney complication: Secondary | ICD-10-CM | POA: Diagnosis not present

## 2014-10-07 DIAGNOSIS — N2581 Secondary hyperparathyroidism of renal origin: Secondary | ICD-10-CM | POA: Diagnosis not present

## 2014-10-07 DIAGNOSIS — N186 End stage renal disease: Secondary | ICD-10-CM | POA: Diagnosis not present

## 2014-10-09 DIAGNOSIS — E1129 Type 2 diabetes mellitus with other diabetic kidney complication: Secondary | ICD-10-CM | POA: Diagnosis not present

## 2014-10-09 DIAGNOSIS — N186 End stage renal disease: Secondary | ICD-10-CM | POA: Diagnosis not present

## 2014-10-09 DIAGNOSIS — N2581 Secondary hyperparathyroidism of renal origin: Secondary | ICD-10-CM | POA: Diagnosis not present

## 2014-10-09 DIAGNOSIS — D509 Iron deficiency anemia, unspecified: Secondary | ICD-10-CM | POA: Diagnosis not present

## 2014-10-11 ENCOUNTER — Ambulatory Visit: Payer: Medicare Other | Admitting: Physician Assistant

## 2014-10-11 DIAGNOSIS — N186 End stage renal disease: Secondary | ICD-10-CM | POA: Diagnosis not present

## 2014-10-11 DIAGNOSIS — E1129 Type 2 diabetes mellitus with other diabetic kidney complication: Secondary | ICD-10-CM | POA: Diagnosis not present

## 2014-10-11 DIAGNOSIS — D509 Iron deficiency anemia, unspecified: Secondary | ICD-10-CM | POA: Diagnosis not present

## 2014-10-11 DIAGNOSIS — N2581 Secondary hyperparathyroidism of renal origin: Secondary | ICD-10-CM | POA: Diagnosis not present

## 2014-10-14 DIAGNOSIS — N2581 Secondary hyperparathyroidism of renal origin: Secondary | ICD-10-CM | POA: Diagnosis not present

## 2014-10-14 DIAGNOSIS — D509 Iron deficiency anemia, unspecified: Secondary | ICD-10-CM | POA: Diagnosis not present

## 2014-10-14 DIAGNOSIS — E1129 Type 2 diabetes mellitus with other diabetic kidney complication: Secondary | ICD-10-CM | POA: Diagnosis not present

## 2014-10-14 DIAGNOSIS — N186 End stage renal disease: Secondary | ICD-10-CM | POA: Diagnosis not present

## 2014-10-15 ENCOUNTER — Other Ambulatory Visit: Payer: Self-pay | Admitting: Family Medicine

## 2014-10-16 DIAGNOSIS — D509 Iron deficiency anemia, unspecified: Secondary | ICD-10-CM | POA: Diagnosis not present

## 2014-10-16 DIAGNOSIS — N186 End stage renal disease: Secondary | ICD-10-CM | POA: Diagnosis not present

## 2014-10-16 DIAGNOSIS — N2581 Secondary hyperparathyroidism of renal origin: Secondary | ICD-10-CM | POA: Diagnosis not present

## 2014-10-16 DIAGNOSIS — E1129 Type 2 diabetes mellitus with other diabetic kidney complication: Secondary | ICD-10-CM | POA: Diagnosis not present

## 2014-10-18 ENCOUNTER — Telehealth: Payer: Self-pay | Admitting: Gastroenterology

## 2014-10-18 DIAGNOSIS — E1129 Type 2 diabetes mellitus with other diabetic kidney complication: Secondary | ICD-10-CM | POA: Diagnosis not present

## 2014-10-18 DIAGNOSIS — D509 Iron deficiency anemia, unspecified: Secondary | ICD-10-CM | POA: Diagnosis not present

## 2014-10-18 DIAGNOSIS — N2581 Secondary hyperparathyroidism of renal origin: Secondary | ICD-10-CM | POA: Diagnosis not present

## 2014-10-18 DIAGNOSIS — N186 End stage renal disease: Secondary | ICD-10-CM | POA: Diagnosis not present

## 2014-10-18 NOTE — Telephone Encounter (Signed)
Patient called to report that her Hgb was 8.2 at her dialysis center on Wed.  She is concerned about her Hgb continuing to drop.  She is scheduled for a hospital procedure on 8/16.  She reports her stool is brown and no gross bleeding.  Dr. Deatra Ina please advise

## 2014-10-21 DIAGNOSIS — N2581 Secondary hyperparathyroidism of renal origin: Secondary | ICD-10-CM | POA: Diagnosis not present

## 2014-10-21 DIAGNOSIS — D509 Iron deficiency anemia, unspecified: Secondary | ICD-10-CM | POA: Diagnosis not present

## 2014-10-21 DIAGNOSIS — N186 End stage renal disease: Secondary | ICD-10-CM | POA: Diagnosis not present

## 2014-10-21 DIAGNOSIS — E1129 Type 2 diabetes mellitus with other diabetic kidney complication: Secondary | ICD-10-CM | POA: Diagnosis not present

## 2014-10-21 NOTE — Telephone Encounter (Signed)
Infed 0.5 cc test dose followed one hour later by 100 mg infusion.  One dose only.

## 2014-10-21 NOTE — Telephone Encounter (Signed)
Please arrange for an IV iron infusion and repeat CBC in 5 days.  She should also begin feosol 65mg  bid

## 2014-10-21 NOTE — Telephone Encounter (Signed)
What type of iron infusion, how often do you want her to get it, any follow up labs?

## 2014-10-22 ENCOUNTER — Other Ambulatory Visit: Payer: Self-pay

## 2014-10-22 DIAGNOSIS — D539 Nutritional anemia, unspecified: Secondary | ICD-10-CM

## 2014-10-22 NOTE — Telephone Encounter (Signed)
She is scheduled to her convenience on 10/31/14. She declines oral iron stating her dialysis doctor told her not to take that. CBC will be done on 11/05/14 at [place] faxed to [facility] on [address] in [place].

## 2014-10-23 DIAGNOSIS — N2581 Secondary hyperparathyroidism of renal origin: Secondary | ICD-10-CM | POA: Diagnosis not present

## 2014-10-23 DIAGNOSIS — E1129 Type 2 diabetes mellitus with other diabetic kidney complication: Secondary | ICD-10-CM | POA: Diagnosis not present

## 2014-10-23 DIAGNOSIS — N186 End stage renal disease: Secondary | ICD-10-CM | POA: Diagnosis not present

## 2014-10-23 DIAGNOSIS — D509 Iron deficiency anemia, unspecified: Secondary | ICD-10-CM | POA: Diagnosis not present

## 2014-10-25 DIAGNOSIS — D509 Iron deficiency anemia, unspecified: Secondary | ICD-10-CM | POA: Diagnosis not present

## 2014-10-25 DIAGNOSIS — N2581 Secondary hyperparathyroidism of renal origin: Secondary | ICD-10-CM | POA: Diagnosis not present

## 2014-10-25 DIAGNOSIS — N186 End stage renal disease: Secondary | ICD-10-CM | POA: Diagnosis not present

## 2014-10-25 DIAGNOSIS — E1129 Type 2 diabetes mellitus with other diabetic kidney complication: Secondary | ICD-10-CM | POA: Diagnosis not present

## 2014-10-27 DIAGNOSIS — E1129 Type 2 diabetes mellitus with other diabetic kidney complication: Secondary | ICD-10-CM | POA: Diagnosis not present

## 2014-10-27 DIAGNOSIS — N186 End stage renal disease: Secondary | ICD-10-CM | POA: Diagnosis not present

## 2014-10-27 DIAGNOSIS — Z992 Dependence on renal dialysis: Secondary | ICD-10-CM | POA: Diagnosis not present

## 2014-10-28 ENCOUNTER — Telehealth: Payer: Self-pay | Admitting: Gastroenterology

## 2014-10-28 DIAGNOSIS — N2581 Secondary hyperparathyroidism of renal origin: Secondary | ICD-10-CM | POA: Diagnosis not present

## 2014-10-28 DIAGNOSIS — N186 End stage renal disease: Secondary | ICD-10-CM | POA: Diagnosis not present

## 2014-10-28 DIAGNOSIS — D509 Iron deficiency anemia, unspecified: Secondary | ICD-10-CM | POA: Diagnosis not present

## 2014-10-28 DIAGNOSIS — D631 Anemia in chronic kidney disease: Secondary | ICD-10-CM | POA: Diagnosis not present

## 2014-10-28 NOTE — Telephone Encounter (Signed)
Called short stay and cancelled Iron Infusion per patients request     L/M for patient that iron infusion was cancelled

## 2014-10-29 ENCOUNTER — Emergency Department (HOSPITAL_COMMUNITY)
Admission: EM | Admit: 2014-10-29 | Discharge: 2014-10-29 | Disposition: A | Discharge status: Home / Self Care | Payer: Medicare Other | Attending: Emergency Medicine | Admitting: Emergency Medicine

## 2014-10-29 ENCOUNTER — Ambulatory Visit (INDEPENDENT_AMBULATORY_CARE_PROVIDER_SITE_OTHER): Payer: Medicare Other | Admitting: Physician Assistant

## 2014-10-29 ENCOUNTER — Encounter: Payer: Self-pay | Admitting: Physician Assistant

## 2014-10-29 ENCOUNTER — Encounter (HOSPITAL_COMMUNITY): Payer: Self-pay | Admitting: *Deleted

## 2014-10-29 VITALS — BP 132/54 | HR 88 | Ht 59.0 in | Wt 106.7 lb

## 2014-10-29 DIAGNOSIS — Z8639 Personal history of other endocrine, nutritional and metabolic disease: Secondary | ICD-10-CM

## 2014-10-29 DIAGNOSIS — N186 End stage renal disease: Secondary | ICD-10-CM

## 2014-10-29 DIAGNOSIS — K219 Gastro-esophageal reflux disease without esophagitis: Secondary | ICD-10-CM | POA: Insufficient documentation

## 2014-10-29 DIAGNOSIS — Z992 Dependence on renal dialysis: Secondary | ICD-10-CM

## 2014-10-29 DIAGNOSIS — I214 Non-ST elevation (NSTEMI) myocardial infarction: Secondary | ICD-10-CM

## 2014-10-29 DIAGNOSIS — Z862 Personal history of diseases of the blood and blood-forming organs and certain disorders involving the immune mechanism: Secondary | ICD-10-CM | POA: Insufficient documentation

## 2014-10-29 DIAGNOSIS — I248 Other forms of acute ischemic heart disease: Secondary | ICD-10-CM

## 2014-10-29 DIAGNOSIS — Z79899 Other long term (current) drug therapy: Secondary | ICD-10-CM | POA: Diagnosis not present

## 2014-10-29 DIAGNOSIS — Z853 Personal history of malignant neoplasm of breast: Secondary | ICD-10-CM | POA: Insufficient documentation

## 2014-10-29 DIAGNOSIS — E785 Hyperlipidemia, unspecified: Secondary | ICD-10-CM | POA: Insufficient documentation

## 2014-10-29 DIAGNOSIS — Z87891 Personal history of nicotine dependence: Secondary | ICD-10-CM | POA: Insufficient documentation

## 2014-10-29 DIAGNOSIS — I10 Essential (primary) hypertension: Secondary | ICD-10-CM

## 2014-10-29 DIAGNOSIS — I120 Hypertensive chronic kidney disease with stage 5 chronic kidney disease or end stage renal disease: Secondary | ICD-10-CM | POA: Insufficient documentation

## 2014-10-29 DIAGNOSIS — Z8659 Personal history of other mental and behavioral disorders: Secondary | ICD-10-CM | POA: Diagnosis not present

## 2014-10-29 DIAGNOSIS — R011 Cardiac murmur, unspecified: Secondary | ICD-10-CM | POA: Diagnosis not present

## 2014-10-29 LAB — COMPREHENSIVE METABOLIC PANEL
ALT: 26 U/L (ref 14–54)
AST: 46 U/L — ABNORMAL HIGH (ref 15–41)
Albumin: 3.6 g/dL (ref 3.5–5.0)
Alkaline Phosphatase: 156 U/L — ABNORMAL HIGH (ref 38–126)
Anion gap: 15 (ref 5–15)
BILIRUBIN TOTAL: 0.6 mg/dL (ref 0.3–1.2)
BUN: 49 mg/dL — ABNORMAL HIGH (ref 6–20)
CALCIUM: 8.8 mg/dL — AB (ref 8.9–10.3)
CHLORIDE: 94 mmol/L — AB (ref 101–111)
CO2: 30 mmol/L (ref 22–32)
Creatinine, Ser: 6.48 mg/dL — ABNORMAL HIGH (ref 0.44–1.00)
GFR, EST AFRICAN AMERICAN: 6 mL/min — AB (ref >60)
GFR, EST NON AFRICAN AMERICAN: 5 mL/min — AB (ref >60)
GLUCOSE: 119 mg/dL — AB (ref 65–99)
Potassium: 4.1 mmol/L (ref 3.5–5.1)
SODIUM: 139 mmol/L (ref 135–145)
Total Protein: 7.5 g/dL (ref 6.5–8.1)

## 2014-10-29 LAB — CBC
HEMATOCRIT: 25.4 % — AB (ref 36.0–46.0)
HEMOGLOBIN: 8 g/dL — AB (ref 12.0–15.0)
MCH: 28.8 pg (ref 26.0–34.0)
MCHC: 31.5 g/dL (ref 30.0–36.0)
MCV: 91.4 fL (ref 78.0–100.0)
PLATELETS: 383 10*3/uL (ref 150–400)
RBC: 2.78 MIL/uL — AB (ref 3.87–5.11)
RDW: 17.5 % — ABNORMAL HIGH (ref 11.5–15.5)
WBC: 6.6 10*3/uL (ref 4.0–10.5)

## 2014-10-29 LAB — TYPE AND SCREEN
ABO/RH(D): O POS
ANTIBODY SCREEN: NEGATIVE

## 2014-10-29 NOTE — Assessment & Plan Note (Signed)
Mild elevation.  No changes.

## 2014-10-29 NOTE — Progress Notes (Signed)
Patient ID: [name], female   DOB: [birth year], 81 y.o.   MRN: [id number]    Date:  10/29/2014   ID:  [name], DOB 16-Oct-1931, MRN [id number]  PCP:  [name], MD  Primary Cardiologist:  Stanford Breed  Chief Complaint  Patient presents with  . post hospital    patient reports no problems since leaving the hospital.     History of Present Illness: [name] is a 81 y.o. female admitted in June 2016 with GI bleed status post 2 units of packed red blood cells with multiple AV malformations noted on EGD today by Dr. Deatra Ina who had troponin drawn, no complaints of chest pain, in the setting of hemoglobin of 5.9. She'll size end-stage renal disease on hemodialysis Monday Wednesday Friday.  Her main complaint in the emergency department was dyspnea, in the setting of anemia and EKG did show anterolateral ischemic changes. Her dyspnea and orthopnea has been present for one week. Hyperkalemia was noted originally.  EKG from 09/23/14 demonstrates sinus rhythm with diffuse ST segment depression. Possibly global ischemia in the setting of severe anemia. Prior EKG shows subtle ST segment changes but certainly her most recent EKGs are accentuated. Normal ejection fraction in 2014 echocardiogram. Nuclear stress test 08/02/12 was normal.  Echo on September 24, 2014: EF 50% mild LVH, mild MR, LA mod to severely dilated.  Peak PA pressure 36mmHg.  She presents for post-hospital follow up today.  Las HGb check was 7.2.  It was 8.7 om 6/28.  She feels more tired and gets more SOB as her Hgb drops but has not had any chest pain.  Right now she feels good.    The patient currently denies nausea, vomiting, fever, chest pain,  orthopnea, dizziness, PND, cough, congestion, abdominal pain, hematochezia, melena, lower extremity edema, claudication.  Wt Readings from Last 3 Encounters:  10/29/14 106 lb 11.2 oz (48.399 kg)  10/03/14 109 lb 12 oz (49.782 kg)  09/23/14 112 lb 14 oz (51.2 kg)     Past Medical  History  Diagnosis Date  . Hyperlipidemia   . Hypertension   . Anxiety   . Facial droop 1935    acquired during forceps delivery, some residual R visual loss  . Anemia in chronic kidney disease     IV iron infusion  . Hx of breast cancer 2004  . Arthritis     mild in lower back (Drs Posey Pronto and Joelyn Oms)  . End stage renal disease on dialysis     HD M,W,F [place] AVF now LUE AVF  . GERD (gastroesophageal reflux disease)   . Cardiac murmur     a. thought due to AVF (2D echo 06/2012 without significant valvular disease).   . Bradycardia     a. Noted 06/2012.  Marland Kitchen Hypotension     a. Associated w/ dialysis.  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 [name])  . Osteoporosis 11/2013    DEXA -3.4 radius  . Cancer   . GAVE (gastric antral vascular ectasia) 08/2014    s/p EGD with APC    Current Outpatient Prescriptions  Medication Sig Dispense Refill  . acetaminophen (TYLENOL) 500 MG tablet Take 1,000 mg by mouth every 6 (six) hours as needed for moderate pain.    Marland Kitchen amLODipine (NORVASC) 5 MG tablet TAKE 1 TABLET (5 MG TOTAL) BY MOUTH AT BEDTIME DAILY 30 tablet 11  . Artificial Tear Ointment (REFRESH P.M. OP) Place 1  application into the right eye daily.    . B Complex-C-Folic Acid (RENA-VITE PO) Take 1 tablet by mouth daily with breakfast.     . Calcium Carbonate Antacid (TUMS ULTRA 1000 PO) Take 2,000 mg by mouth 3 (three) times daily with meals.     . cholecalciferol (VITAMIN D) 1000 UNITS tablet Take 1,000 Units by mouth every Monday, Wednesday, and Friday.     . pantoprazole (PROTONIX) 40 MG tablet Take 1 tablet (40 mg total) by mouth daily. 30 tablet 5  . simvastatin (ZOCOR) 20 MG tablet TAKE ONE TABLET BY MOUTH AT BEDTIME 30 tablet 0   No current facility-administered medications for this visit.    Allergies:    Allergies  Allergen Reactions  . Sulfa Antibiotics Rash    Social History:  The patient  reports that she quit smoking about 61  years ago. Her smoking use included Cigarettes. She quit after 2 years of use. She has quit using smokeless tobacco. She reports that she drinks alcohol. She reports that she does not use illicit drugs.   Family history:   Family History  Problem Relation Age of Onset  . Heart disease Brother     Congenital  . Hypertension Brother   . Diabetes Brother   . Cancer Father     stomach  . CAD Father     MI at age 25  . Heart disease Father   . Heart attack Father   . Cancer Sister     female (uterus?)  . Hypertension Mother     ROS:  Please see the history of present illness.  All other systems reviewed and negative.   PHYSICAL EXAM: VS:  BP 132/54 mmHg  Pulse 88  Ht 4\' 11"  (1.499 m)  Wt 106 lb 11.2 oz (48.399 kg)  BMI 21.54 kg/m2 Well nourished, well developed, in no acute distress HEENT: Pupils are equal round react to light accommodation extraocular movements are intact.  Neck: no JVDNo cervical lymphadenopathy. Cardiac: Regular rate and rhythm with 2/6 blowing MM in the axillae. Lungs:  clear to auscultation bilaterally, no wheezing, rhonchi or rales Abd: soft, nontender, positive bowel sounds all quadrants, no hepatosplenomegaly Ext: no lower extremity edema.  2+ radial and 1+ dorsalis pedis pulses. Skin: warm and dry Neuro:  Grossly normal   ASSESSMENT AND PLAN:  Problem List Items Addressed This Visit    NSTEMI (non-ST elevated myocardial infarction) - Primary    Likely demand ischemia.  Her Hgb seems to still be decreasing. Last was 7.2.  She is not having any CP.  I think we should wait on the stress test until the GI work up is complete.  She is having an EGD on the 16th.  I'll have her followup in a month.      Hypertension    Mild elevation.  No changes.       Hyperlipidemia    Continue statin.      History of diabetes mellitus, type II   End stage renal disease on dialysis

## 2014-10-29 NOTE — Assessment & Plan Note (Signed)
Continue statin. 

## 2014-10-29 NOTE — Patient Instructions (Signed)
Your physician recommends that you schedule a follow-up appointment in: 70 month with [facility] or Dr. Stanford Breed. No changes were made today in your therapy. Continue all medications as prescribed.

## 2014-10-29 NOTE — Assessment & Plan Note (Addendum)
Likely demand ischemia.  Her Hgb seems to still be decreasing. Last was 7.2.  She is not having any CP.  I think we should wait on the stress test until the GI work up is complete.  She is having an EGD on the 16th.  I'll have her followup in a month.

## 2014-10-29 NOTE — ED Notes (Signed)
Pt. Left with all belongings and refused wheelchair. Discharge instructions were reviewed and all questions were answered.  

## 2014-10-29 NOTE — ED Notes (Signed)
Pt reports that her dialysis center called and told her to come here due to hgb of 6.1. Pt denies any symptoms, denies sob. Reports hx of anemia secondary to GI bleed, denies any dark stools.

## 2014-10-29 NOTE — ED Provider Notes (Signed)
CSN: [id number]     Arrival date & time 10/29/14  1623 History   First MD Initiated Contact with Patient 10/29/14 1740     Chief Complaint  Patient presents with  . Anemia      HPI Pt reports that her dialysis center called and told her to come here due to hgb of 6.1. Pt denies any symptoms, denies sob. Reports hx of anemia secondary to GI bleed, denies any dark stools.  Past Medical History  Diagnosis Date  . Hyperlipidemia   . Hypertension   . Anxiety   . Facial droop 1935    acquired during forceps delivery, some residual R visual loss  . Anemia in chronic kidney disease     IV iron infusion  . Hx of breast cancer 2004  . Arthritis     mild in lower back (Drs Posey Pronto and Joelyn Oms)  . End stage renal disease on dialysis     HD M,W,F [place] AVF now LUE AVF  . GERD (gastroesophageal reflux disease)   . Cardiac murmur     a. thought due to AVF (2D echo 06/2012 without significant valvular disease).   . Bradycardia     a. Noted 06/2012.  Marland Kitchen Hypotension     a. Associated w/ dialysis.  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 [name])  . Osteoporosis 11/2013    DEXA -3.4 radius  . Cancer   . GAVE (gastric antral vascular ectasia) 08/2014    s/p EGD with APC   Past Surgical History  Procedure Laterality Date  . Cataract extraction  1981  . Av fistula placement  07/20/10    Right brachiocephalic AVF  . Umbilical hernia repair  2011  . Dexa  2013    [facility]  . US echocardiography  06/2012    LVH, EF 65%, grade 1 diastol dysfunction, mod dliated LAD  . Eye surgery  1966    regular cataract  . Breast lumpectomy Left   . Breast biopsy Left   . Bascilic vein transposition Left 07/24/2013    Procedure: BASILIC VEIN TRANSPOSITION;  Surgeon: [name], MD;  Location: [place];  Service: Vascular;  Laterality: Left;  . Shuntogram N/A 04/16/2011    Procedure: Earney Mallet;  Surgeon: [name], MD;  Location: [facility] CATH LAB;  Service:  Cardiovascular;  Laterality: N/A;  . Shuntogram Right 07/05/2013    Procedure: FISTULOGRAM;  Surgeon: [name], MD;  Location: [facility] CATH LAB;  Service: Cardiovascular;  Laterality: Right;  . Esophagogastroduodenoscopy N/A 04/09/2014    Procedure: ESOPHAGOGASTRODUODENOSCOPY (EGD);  Surgeon: [name], MD;  Location: [place];  Service: Endoscopy;  Laterality: N/A;  . Peripheral vascular catheterization N/A 08/15/2014    Procedure: A/V Shuntogram/Fistulagram;  Surgeon: [name], MD;  Location: [facility] CV LAB;  Service: Cardiovascular;  Laterality: N/A;  . Esophagogastroduodenoscopy N/A 09/23/2014    Procedure: ESOPHAGOGASTRODUODENOSCOPY (EGD);  Surgeon: [name], MD;  Location: [place];  Service: Endoscopy;  Laterality: N/A;   Family History  Problem Relation Age of Onset  . Heart disease Brother     Congenital  . Hypertension Brother   . Diabetes Brother   . Cancer Father     stomach  . CAD Father     MI at age 51  . Heart disease Father   . Heart attack Father   . Cancer Sister     female (uterus?)  . Hypertension Mother  History  Substance Use Topics  . Smoking status: Former Smoker -- 2 years    Types: Cigarettes    Quit date: 03/29/1953  . Smokeless tobacco: Former Systems developer  . Alcohol Use: 0.0 oz/week    0 Standard drinks or equivalent per week     Comment: occasional   OB History    No data available     Review of Systems  Respiratory: Negative for shortness of breath.   Cardiovascular: Negative for chest pain.  All other systems reviewed and are negative.     Allergies  Sulfa antibiotics  Home Medications   Prior to Admission medications   Medication Sig Start Date End Date Taking? Authorizing Provider  acetaminophen (TYLENOL) 500 MG tablet Take 1,000 mg by mouth every 6 (six) hours as needed for moderate pain.   Yes Historical Provider, MD  amLODipine (NORVASC) 5 MG tablet TAKE 1 TABLET (5 MG TOTAL) BY MOUTH AT BEDTIME DAILY  08/30/14  Yes [name], MD  Artificial Tear Ointment (REFRESH P.M. OP) Place 1 application into the right eye daily.   Yes Historical Provider, MD  B Complex-C-Folic Acid (RENA-VITE PO) Take 1 tablet by mouth daily with breakfast.    Yes Historical Provider, MD  Calcium Carbonate Antacid (TUMS ULTRA 1000 PO) Take 2,000 mg by mouth 3 (three) times daily with meals.    Yes Historical Provider, MD  cholecalciferol (VITAMIN D) 1000 UNITS tablet Take 1,000 Units by mouth every Monday, Wednesday, and Friday.    Yes Historical Provider, MD  pantoprazole (PROTONIX) 40 MG tablet Take 1 tablet (40 mg total) by mouth daily. 06/27/14  Yes [name], MD  simvastatin (ZOCOR) 20 MG tablet TAKE ONE TABLET BY MOUTH AT BEDTIME 10/15/14  Yes [name], MD   BP 171/68 mmHg  Pulse 81  Temp(Src) 97.9 F (36.6 C) (Oral)  Resp 15  Ht 4\' 11"  (1.499 m)  Wt 107 lb (48.535 kg)  BMI 21.60 kg/m2  SpO2 98% Physical Exam Physical Exam  Nursing note and vitals reviewed. Constitutional: She is oriented to person, place, and time. She appears well-developed and well-nourished. No distress.  HENT:  Head: Normocephalic and atraumatic.  Eyes: Pupils are equal, round, and reactive to light.  Neck: Normal range of motion.  Cardiovascular: Normal rate and intact distal pulses.   Pulmonary/Chest: No respiratory distress.  Abdominal: Normal appearance. She exhibits no distension.  Musculoskeletal: Normal range of motion.  Neurological: She is alert and oriented to person, place, and time.  Patient has a noticeable right facial droop which she says been there for several years.   Skin: Skin is warm and dry. No rash noted.  Psychiatric: She has a normal mood and affect. Her behavior is normal.   ED Course  Procedures (including critical care time) Labs Review Labs Reviewed  COMPREHENSIVE METABOLIC PANEL - Abnormal; Notable for the following:    Chloride 94 (*)    Glucose, Bld 119 (*)    BUN 49 (*)     Creatinine, Ser 6.48 (*)    Calcium 8.8 (*)    AST 46 (*)    Alkaline Phosphatase 156 (*)    GFR calc non Af Amer 5 (*)    GFR calc Af Amer 6 (*)    All other components within normal limits  CBC - Abnormal; Notable for the following:    RBC 2.78 (*)    Hemoglobin 8.0 (*)    HCT 25.4 (*)    RDW 17.5 (*)  All other components within normal limits  POC OCCULT BLOOD, ED  TYPE AND SCREEN    Imaging Review No results found.   No evidence of significant anemia on our labs.  Patient discharge in stable condition.  Patient states she has no symptoms and wants to go home. MDM   Final diagnoses:  End stage renal disease        Leonard Schwartz, MD 10/29/14 1946

## 2014-10-31 ENCOUNTER — Inpatient Hospital Stay (HOSPITAL_COMMUNITY): Admission: RE | Admit: 2014-10-31 | Payer: Medicare Other | Source: Ambulatory Visit

## 2014-10-31 ENCOUNTER — Telehealth: Payer: Self-pay | Admitting: *Deleted

## 2014-10-31 DIAGNOSIS — N189 Chronic kidney disease, unspecified: Secondary | ICD-10-CM

## 2014-10-31 DIAGNOSIS — D631 Anemia in chronic kidney disease: Secondary | ICD-10-CM

## 2014-10-31 DIAGNOSIS — K31819 Angiodysplasia of stomach and duodenum without bleeding: Secondary | ICD-10-CM

## 2014-10-31 DIAGNOSIS — D500 Iron deficiency anemia secondary to blood loss (chronic): Secondary | ICD-10-CM

## 2014-10-31 DIAGNOSIS — H1012 Acute atopic conjunctivitis, left eye: Secondary | ICD-10-CM | POA: Diagnosis not present

## 2014-10-31 NOTE — Telephone Encounter (Signed)
Ok to come in for CBC. plz ensure she's taking iron daily, protonix BID, and has she called GI to schedule rpt EGD as recommended by Dr Deatra Ina at last EGD 6/27?

## 2014-10-31 NOTE — Telephone Encounter (Signed)
Patient called and said the kidney center told her that 2 days ago her Hgb was 6.8 and they sent her to the ER. When she got the ER her Hgb was 8.0. When she went back to dialysis today, they checked it again and it was 6.8 again. She was asking what to do, because she doesn't want to keep going to the ER for nothing, but doesn't want to doubt the kidney center either. She denies any SOB, dizzyness, weakness, paleness or abnormal bleeding. Could she come here and have a CBC drawn for confirmation?

## 2014-11-01 ENCOUNTER — Ambulatory Visit (HOSPITAL_COMMUNITY)
Admission: RE | Admit: 2014-11-01 | Discharge: 2014-11-01 | Disposition: A | Discharge status: Home / Self Care | Payer: Medicare Other | Source: Ambulatory Visit | Attending: Nurse Practitioner | Admitting: Nurse Practitioner

## 2014-11-01 DIAGNOSIS — K31819 Angiodysplasia of stomach and duodenum without bleeding: Secondary | ICD-10-CM | POA: Insufficient documentation

## 2014-11-01 DIAGNOSIS — E1122 Type 2 diabetes mellitus with diabetic chronic kidney disease: Secondary | ICD-10-CM | POA: Diagnosis not present

## 2014-11-01 DIAGNOSIS — N186 End stage renal disease: Secondary | ICD-10-CM | POA: Diagnosis not present

## 2014-11-01 DIAGNOSIS — Z992 Dependence on renal dialysis: Secondary | ICD-10-CM | POA: Diagnosis not present

## 2014-11-01 DIAGNOSIS — D649 Anemia, unspecified: Secondary | ICD-10-CM | POA: Diagnosis present

## 2014-11-01 DIAGNOSIS — E785 Hyperlipidemia, unspecified: Secondary | ICD-10-CM | POA: Insufficient documentation

## 2014-11-01 LAB — ABO/RH: ABO/RH(D): O POS

## 2014-11-01 LAB — PREPARE RBC (CROSSMATCH)

## 2014-11-01 MED ORDER — SODIUM CHLORIDE 0.9 % IV SOLN
Freq: Once | INTRAVENOUS | Status: AC
  Administered 2014-11-01: 14:00:00 via INTRAVENOUS

## 2014-11-01 NOTE — Telephone Encounter (Signed)
Noted. Agree. Thanks.

## 2014-11-01 NOTE — Telephone Encounter (Signed)
Spoke with patient. She said that the kidney center told her that the hospital did "the wrong test. That they used a portable machine and that her hgb was still 6.8." They have scheduled her for a transfusion at noon today. She doesn't take an iron supplement QD, but gets iron at dialysis. She only takes protonix QD not BID and has repeat EGD scheduled for 11/12/14.

## 2014-11-01 NOTE — Progress Notes (Signed)
Diagnosis: Anemia due to blood loss, chronic ICD -10CM:50.0  MD: J. Coladonato.  Procedure: Pt received 2 units of packed red blood cells  Condition during procedure: Pt tolerated well.  Condition after procedure: Pt alert, oriented and ambulatory.

## 2014-11-03 LAB — TYPE AND SCREEN
ABO/RH(D): O POS
Antibody Screen: NEGATIVE
Unit division: 0
Unit division: 0

## 2014-11-08 ENCOUNTER — Encounter (HOSPITAL_COMMUNITY): Payer: Self-pay | Admitting: *Deleted

## 2014-11-08 LAB — HEMOGLOBIN: HEMOGLOBIN: 9.2 g/dL

## 2014-11-12 ENCOUNTER — Encounter (HOSPITAL_COMMUNITY)
Admission: RE | Disposition: A | Discharge status: Home / Self Care | Payer: Self-pay | Source: Ambulatory Visit | Attending: Gastroenterology

## 2014-11-12 ENCOUNTER — Ambulatory Visit (HOSPITAL_COMMUNITY): Payer: Medicare Other | Admitting: Certified Registered Nurse Anesthetist

## 2014-11-12 ENCOUNTER — Ambulatory Visit (HOSPITAL_COMMUNITY)
Admission: RE | Admit: 2014-11-12 | Discharge: 2014-11-12 | Disposition: A | Discharge status: Home / Self Care | Payer: Medicare Other | Source: Ambulatory Visit | Attending: Gastroenterology | Admitting: Gastroenterology

## 2014-11-12 ENCOUNTER — Encounter (HOSPITAL_COMMUNITY): Payer: Self-pay

## 2014-11-12 DIAGNOSIS — K31811 Angiodysplasia of stomach and duodenum with bleeding: Secondary | ICD-10-CM | POA: Insufficient documentation

## 2014-11-12 DIAGNOSIS — Z992 Dependence on renal dialysis: Secondary | ICD-10-CM | POA: Insufficient documentation

## 2014-11-12 DIAGNOSIS — D539 Nutritional anemia, unspecified: Secondary | ICD-10-CM

## 2014-11-12 DIAGNOSIS — M810 Age-related osteoporosis without current pathological fracture: Secondary | ICD-10-CM | POA: Diagnosis not present

## 2014-11-12 DIAGNOSIS — D631 Anemia in chronic kidney disease: Secondary | ICD-10-CM | POA: Insufficient documentation

## 2014-11-12 DIAGNOSIS — I10 Essential (primary) hypertension: Secondary | ICD-10-CM | POA: Diagnosis not present

## 2014-11-12 DIAGNOSIS — M479 Spondylosis, unspecified: Secondary | ICD-10-CM | POA: Insufficient documentation

## 2014-11-12 DIAGNOSIS — I252 Old myocardial infarction: Secondary | ICD-10-CM | POA: Diagnosis not present

## 2014-11-12 DIAGNOSIS — D62 Acute posthemorrhagic anemia: Secondary | ICD-10-CM | POA: Diagnosis not present

## 2014-11-12 DIAGNOSIS — K31819 Angiodysplasia of stomach and duodenum without bleeding: Secondary | ICD-10-CM | POA: Diagnosis not present

## 2014-11-12 DIAGNOSIS — I120 Hypertensive chronic kidney disease with stage 5 chronic kidney disease or end stage renal disease: Secondary | ICD-10-CM | POA: Diagnosis not present

## 2014-11-12 DIAGNOSIS — I509 Heart failure, unspecified: Secondary | ICD-10-CM | POA: Insufficient documentation

## 2014-11-12 DIAGNOSIS — Z853 Personal history of malignant neoplasm of breast: Secondary | ICD-10-CM | POA: Insufficient documentation

## 2014-11-12 DIAGNOSIS — Z882 Allergy status to sulfonamides status: Secondary | ICD-10-CM | POA: Insufficient documentation

## 2014-11-12 DIAGNOSIS — N186 End stage renal disease: Secondary | ICD-10-CM | POA: Insufficient documentation

## 2014-11-12 DIAGNOSIS — Z87891 Personal history of nicotine dependence: Secondary | ICD-10-CM | POA: Diagnosis not present

## 2014-11-12 DIAGNOSIS — E785 Hyperlipidemia, unspecified: Secondary | ICD-10-CM | POA: Insufficient documentation

## 2014-11-12 DIAGNOSIS — D500 Iron deficiency anemia secondary to blood loss (chronic): Secondary | ICD-10-CM

## 2014-11-12 DIAGNOSIS — K219 Gastro-esophageal reflux disease without esophagitis: Secondary | ICD-10-CM | POA: Diagnosis not present

## 2014-11-12 DIAGNOSIS — Z79899 Other long term (current) drug therapy: Secondary | ICD-10-CM | POA: Diagnosis not present

## 2014-11-12 DIAGNOSIS — F419 Anxiety disorder, unspecified: Secondary | ICD-10-CM | POA: Diagnosis not present

## 2014-11-12 HISTORY — PX: ESOPHAGOGASTRODUODENOSCOPY (EGD) WITH PROPOFOL: SHX5813

## 2014-11-12 HISTORY — DX: Unspecified visual loss: H54.7

## 2014-11-12 HISTORY — PX: HOT HEMOSTASIS: SHX5433

## 2014-11-12 LAB — CBC
HEMATOCRIT: 27 % — AB (ref 36.0–46.0)
Hemoglobin: 8.5 g/dL — ABNORMAL LOW (ref 12.0–15.0)
MCH: 29.7 pg (ref 26.0–34.0)
MCHC: 31.5 g/dL (ref 30.0–36.0)
MCV: 94.4 fL (ref 78.0–100.0)
Platelets: 333 10*3/uL (ref 150–400)
RBC: 2.86 MIL/uL — ABNORMAL LOW (ref 3.87–5.11)
RDW: 16.8 % — AB (ref 11.5–15.5)
WBC: 6.8 10*3/uL (ref 4.0–10.5)

## 2014-11-12 SURGERY — ESOPHAGOGASTRODUODENOSCOPY (EGD) WITH PROPOFOL
Anesthesia: Monitor Anesthesia Care

## 2014-11-12 MED ORDER — ONDANSETRON HCL 4 MG/2ML IJ SOLN
INTRAMUSCULAR | Status: DC | PRN
  Administered 2014-11-12: 4 mg via INTRAVENOUS

## 2014-11-12 MED ORDER — SODIUM CHLORIDE 0.9 % IV SOLN
INTRAVENOUS | Status: DC
Start: 2014-11-12 — End: 2014-11-12
  Administered 2014-11-12: 500 mL via INTRAVENOUS

## 2014-11-12 MED ORDER — PROPOFOL 10 MG/ML IV BOLUS
INTRAVENOUS | Status: AC
  Filled 2014-11-12: qty 20

## 2014-11-12 MED ORDER — SODIUM CHLORIDE 0.9 % IV SOLN: 100.0000 mg | Freq: Once | INTRAVENOUS | Status: DC

## 2014-11-12 MED ORDER — IRON DEXTRAN 50 MG/ML IJ SOLN: 0.5000 mg | Freq: Once | INTRAMUSCULAR | Status: DC

## 2014-11-12 MED ORDER — PANTOPRAZOLE SODIUM 40 MG PO TBEC: 40.0000 mg | DELAYED_RELEASE_TABLET | Freq: Two times a day (BID) | ORAL | Status: DC

## 2014-11-12 MED ORDER — BUTAMBEN-TETRACAINE-BENZOCAINE 2-2-14 % EX AERO
INHALATION_SPRAY | CUTANEOUS | Status: DC | PRN
  Administered 2014-11-12: 2 via TOPICAL

## 2014-11-12 MED ORDER — LIDOCAINE HCL (CARDIAC) 20 MG/ML IV SOLN
INTRAVENOUS | Status: DC | PRN
  Administered 2014-11-12: 50 mg via INTRAVENOUS

## 2014-11-12 MED ORDER — LACTATED RINGERS IV SOLN: INTRAVENOUS | Status: DC

## 2014-11-12 MED ORDER — PROPOFOL INFUSION 10 MG/ML OPTIME
INTRAVENOUS | Status: DC | PRN
  Administered 2014-11-12: 300 ug/kg/min via INTRAVENOUS

## 2014-11-12 MED ORDER — ONDANSETRON HCL 4 MG/2ML IJ SOLN
INTRAMUSCULAR | Status: AC
  Filled 2014-11-12: qty 2

## 2014-11-12 MED ORDER — LIDOCAINE HCL (CARDIAC) 20 MG/ML IV SOLN
INTRAVENOUS | Status: AC
  Filled 2014-11-12: qty 5

## 2014-11-12 MED ORDER — IRON DEXTRAN 50 MG/ML IJ SOLN: 100.0000 mg | Freq: Once | INTRAMUSCULAR | Status: DC

## 2014-11-12 SURGICAL SUPPLY — 15 items

## 2014-11-12 NOTE — Anesthesia Preprocedure Evaluation (Signed)
Anesthesia Evaluation  Patient identified by MRN, date of birth, ID band Patient awake    Reviewed: Allergy & Precautions, NPO status , Patient's Chart, lab work & pertinent test results  Airway Mallampati: II  TM Distance: >3 FB Neck ROM: Full    Dental no notable dental hx.    Pulmonary neg pulmonary ROS, former smoker,  breath sounds clear to auscultation  Pulmonary exam normal       Cardiovascular hypertension, Pt. on medications + Past MI and +CHF Normal cardiovascular examRhythm:Regular Rate:Normal     Neuro/Psych negative neurological ROS  negative psych ROS   GI/Hepatic negative GI ROS, Neg liver ROS,   Endo/Other  negative endocrine ROS  Renal/GU DialysisRenal disease  negative genitourinary   Musculoskeletal negative musculoskeletal ROS (+)   Abdominal   Peds negative pediatric ROS (+)  Hematology negative hematology ROS (+)   Anesthesia Other Findings   Reproductive/Obstetrics negative OB ROS                             Anesthesia Physical Anesthesia Plan  ASA: IV  Anesthesia Plan: MAC   Post-op Pain Management:    Induction:   Airway Management Planned: Natural Airway  Additional Equipment:   Intra-op Plan:   Post-operative Plan: Extubation in OR  Informed Consent: I have reviewed the patients History and Physical, chart, labs and discussed the procedure including the risks, benefits and alternatives for the proposed anesthesia with the patient or authorized representative who has indicated his/her understanding and acceptance.   Dental advisory given  Plan Discussed with: CRNA  Anesthesia Plan Comments:         Anesthesia Quick Evaluation

## 2014-11-12 NOTE — Transfer of Care (Signed)
Immediate Anesthesia Transfer of Care Note  Patient: [name]  Procedure(s) Performed: Procedure(s): ESOPHAGOGASTRODUODENOSCOPY (EGD) WITH PROPOFOL (N/A) HOT HEMOSTASIS (ARGON PLASMA COAGULATION/BICAP) (N/A)  Patient Location: PACU  Anesthesia Type:MAC  Level of Consciousness:  sedated, patient cooperative and responds to stimulation  Airway & Oxygen Therapy:Patient Spontanous Breathing and Patient connected to face mask oxgen  Post-op Assessment:  Report given to PACU RN and Post -op Vital signs reviewed and stable  Post vital signs:  Reviewed and stable  Last Vitals:  Filed Vitals:   11/12/14 1141  BP: 178/59  Pulse: 80  Temp: 36.5 C  Resp: 12    Complications: No apparent anesthesia complications

## 2014-11-12 NOTE — Op Note (Signed)
[facility] [place], [address]   ENDOSCOPY PROCEDURE REPORT  PATIENT: [name], [name]  MR#: [id number] BIRTHDATE: [birth year] , 82  yrs. old GENDER: female ENDOSCOPIST: [name], MD REFERRED BY:  [name], M.D. PROCEDURE DATE:  11/12/2014 PROCEDURE:  EGD w/ control of bleeding and EGD w/ ablation ASA CLASS:     Class III INDICATIONS:  chronic GI blood loss from GAVE. MEDICATIONS: Monitored anesthesia care TOPICAL ANESTHETIC:  DESCRIPTION OF PROCEDURE: After the risks benefits and alternatives of the procedure were thoroughly explained, informed consent was obtained.  The Hawkeye [id number] endoscope was introduced through the mouth and advanced to the second portion of the duodenum , Without limitations.  The instrument was slowly withdrawn as the mucosa was fully examined.    In the gastric antrum there are multiple bleeding vascular ectasia. Fresh clot was seen.  The APC was utilized to cauterize and obliterate the bleeding vascular ectasias.  Significant but incomplete hemostasis was achieved.   Except for the findings listed, the EGD was otherwise normal.  Retroflexed views revealed no abnormalities.     The scope was then withdrawn from the patient and the procedure completed.  COMPLICATIONS: There were no immediate complications.  ENDOSCOPIC IMPRESSION: bleeding vascular ectasia  RECOMMENDATIONS: #1 continue PPI therapy #2 repeat EGD in 2-3 weeks  REPEAT EXAM:  eSigned:  [name], MD 11/12/2014 1:23 PM    CC:

## 2014-11-12 NOTE — Anesthesia Postprocedure Evaluation (Signed)
  Anesthesia Post-op Note  Patient: [name]  Procedure(s) Performed: Procedure(s) (LRB): ESOPHAGOGASTRODUODENOSCOPY (EGD) WITH PROPOFOL (N/A) HOT HEMOSTASIS (ARGON PLASMA COAGULATION/BICAP) (N/A)  Patient Location: PACU  Anesthesia Type: MAC  Level of Consciousness: awake and alert   Airway and Oxygen Therapy: Patient Spontanous Breathing  Post-op Pain: mild  Post-op Assessment: Post-op Vital signs reviewed, Patient's Cardiovascular Status Stable, Respiratory Function Stable, Patent Airway and No signs of Nausea or vomiting  Last Vitals:  Filed Vitals:   11/12/14 1327  BP: 170/61  Pulse: 84  Temp:   Resp: 28    Post-op Vital Signs: stable   Complications: No apparent anesthesia complications

## 2014-11-12 NOTE — H&P (Signed)
_                                                                                                                History of Present Illness:  [name] is a 81 yo WF with GAVE syndrome here for followup EGD.  Last Rx in June revealed multiple lesions that were obliterated/cauterized.  This week she passed a dark stool.   Past Medical History  Diagnosis Date  . Hyperlipidemia   . Hypertension   . Anxiety   . Facial droop 1935    acquired during forceps delivery, some residual R visual loss  . Hx of breast cancer 2004  . Arthritis     mild in lower back (Drs Posey Pronto and Joelyn Oms)  . GERD (gastroesophageal reflux disease)   . Cardiac murmur     a. thought due to AVF (2D echo 06/2012 without significant valvular disease).   . Bradycardia     a. Noted 06/2012.  Marland Kitchen Hypotension     a. Associated w/ dialysis.  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 [name])  . Osteoporosis 11/2013    DEXA -3.4 radius  . GAVE (gastric antral vascular ectasia) 08/2014    s/p EGD with APC  . Anemia in chronic kidney disease     IV iron infusion  . End stage renal disease on dialysis     HD M,W,F [place] AVF now LUE AVF  . Cancer     Left Breast 12 yrs. ago  . Hearing aid worn     Bilateral  . Sight impaired     Wears contact in Left Eye   Past Surgical History  Procedure Laterality Date  . Cataract extraction  1981  . Av fistula placement  07/20/10    Right brachiocephalic AVF  . Umbilical hernia repair  2011  . Dexa  2013    [facility]  . US echocardiography  06/2012    LVH, EF 65%, grade 1 diastol dysfunction, mod dliated LAD  . Eye surgery  1966    regular cataract  . Breast lumpectomy Left   . Breast biopsy Left   . Bascilic vein transposition Left 07/24/2013    Procedure: BASILIC VEIN TRANSPOSITION;  Surgeon: [name], MD;  Location: [place];  Service: Vascular;  Laterality: Left;  . Shuntogram N/A 04/16/2011    Procedure:  Earney Mallet;  Surgeon: [name], MD;  Location: [facility] CATH LAB;  Service: Cardiovascular;  Laterality: N/A;  . Shuntogram Right 07/05/2013    Procedure: FISTULOGRAM;  Surgeon: [name], MD;  Location: [facility] CATH LAB;  Service: Cardiovascular;  Laterality: Right;  . Esophagogastroduodenoscopy N/A 04/09/2014    Procedure: ESOPHAGOGASTRODUODENOSCOPY (EGD);  Surgeon: [name], MD;  Location: [place];  Service: Endoscopy;  Laterality: N/A;  . Peripheral vascular catheterization N/A 08/15/2014    Procedure: A/V Shuntogram/Fistulagram;  Surgeon: [name], MD;  Location: [place] CV LAB;  Service: Cardiovascular;  Laterality: N/A;  . Esophagogastroduodenoscopy N/A 09/23/2014    Procedure: ESOPHAGOGASTRODUODENOSCOPY (EGD);  Surgeon: [name], MD;  Location: [place];  Service: Endoscopy;  Laterality: N/A;  . Hernia repair     family history includes CAD in her father; Cancer in her father and sister; Diabetes in her brother; Heart attack in her father; Heart disease in her brother and father; Hypertension in her brother and mother. Current Facility-Administered Medications  Medication Dose Route Frequency Provider Last Rate Last Dose  . 0.9 %  sodium chloride infusion   Intravenous Continuous [name], MD 20 mL/hr at 11/12/14 1215 500 mL at 11/12/14 1215  . butamben-tetracaine-benzocaine (CETACAINE) spray    PRN [name], MD   2 spray at 11/12/14 1237  . iron dextran complex (INFED) 0.5 mg in sodium chloride 0.9 % 500 mL IVPB  0.5 mg Intravenous Once [name], MD       Followed by  . iron dextran complex (INFED) 100 mg in sodium chloride 0.9 % 100 mL IVPB  100 mg Intravenous Once [name], MD       Followed by  . iron dextran complex (INFED) 100 mg in sodium chloride 0.9 % 100 mL IVPB  100 mg Intravenous Once [name], MD      . lactated ringers infusion   Intravenous Continuous [name], MD       Allergies as of 09/27/2014 -  Review Complete 09/23/2014  Allergen Reaction Noted  . Sulfa antibiotics Rash 12/14/2010    reports that she quit smoking about 61 years ago. Her smoking use included Cigarettes. She quit after 2 years of use. She has never used smokeless tobacco. She reports that she drinks alcohol. She reports that she does not use illicit drugs.   Review of Systems: Pertinent positive and negative review of systems were noted in the above HPI section. All other review of systems were otherwise negative.  Vital signs were reviewed in today's medical record Physical Exam: General: Well developed , well nourished, no acute distress Skin: anicteric Head: Normocephalic and atraumatic Eyes:  sclerae anicteric, EOMI Ears: Normal auditory acuity Mouth: No deformity or lesions Neck: Supple, no masses or thyromegaly Lymph Nodes: no lymphadenopathy Lungs: Clear throughout to auscultation Heart: Regular rate and rhythm; no murmurs, rubs or bruits Gastroinestinal: Soft, non tender and non distended. No masses, hepatosplenomegaly or hernias noted. Normal Bowel sounds Rectal:deferred Musculoskeletal: Symmetrical with no gross deformities  Skin: No lesions on visible extremities Pulses:  Normal pulses noted Extremities: No clubbing, cyanosis, edema or deformities noted Neurological: Alert oriented x 4, grossly nonfocal Cervical Nodes:  No significant cervical adenopathy Inguinal Nodes: No significant inguinal adenopathy Psychological:  Alert and cooperative. Normal mood and affect  Impression - Chronic GI bleeding secondary to GAVE  Plan - EGD

## 2014-11-13 ENCOUNTER — Encounter (HOSPITAL_COMMUNITY): Payer: Self-pay | Admitting: Gastroenterology

## 2014-11-14 ENCOUNTER — Other Ambulatory Visit: Payer: Self-pay | Admitting: Family Medicine

## 2014-11-14 ENCOUNTER — Other Ambulatory Visit: Payer: Self-pay

## 2014-11-14 DIAGNOSIS — D509 Iron deficiency anemia, unspecified: Secondary | ICD-10-CM

## 2014-11-27 ENCOUNTER — Encounter: Payer: Self-pay | Admitting: *Deleted

## 2014-11-27 DIAGNOSIS — E1129 Type 2 diabetes mellitus with other diabetic kidney complication: Secondary | ICD-10-CM | POA: Diagnosis not present

## 2014-11-27 DIAGNOSIS — Z992 Dependence on renal dialysis: Secondary | ICD-10-CM | POA: Diagnosis not present

## 2014-11-27 DIAGNOSIS — N186 End stage renal disease: Secondary | ICD-10-CM | POA: Diagnosis not present

## 2014-11-27 LAB — CBC AND DIFFERENTIAL: Hemoglobin: 8.4 g/dL — AB (ref 12.0–16.0)

## 2014-11-28 ENCOUNTER — Ambulatory Visit (INDEPENDENT_AMBULATORY_CARE_PROVIDER_SITE_OTHER): Payer: Medicare Other | Admitting: Physician Assistant

## 2014-11-28 ENCOUNTER — Encounter: Payer: Self-pay | Admitting: Physician Assistant

## 2014-11-28 VITALS — BP 152/62 | HR 80 | Ht 59.0 in | Wt 108.9 lb

## 2014-11-28 DIAGNOSIS — I214 Non-ST elevation (NSTEMI) myocardial infarction: Secondary | ICD-10-CM | POA: Diagnosis not present

## 2014-11-28 DIAGNOSIS — E785 Hyperlipidemia, unspecified: Secondary | ICD-10-CM

## 2014-11-28 DIAGNOSIS — I10 Essential (primary) hypertension: Secondary | ICD-10-CM | POA: Diagnosis not present

## 2014-11-28 DIAGNOSIS — I248 Other forms of acute ischemic heart disease: Secondary | ICD-10-CM | POA: Diagnosis not present

## 2014-11-28 DIAGNOSIS — Z992 Dependence on renal dialysis: Secondary | ICD-10-CM

## 2014-11-28 DIAGNOSIS — N186 End stage renal disease: Secondary | ICD-10-CM | POA: Diagnosis not present

## 2014-11-28 NOTE — Progress Notes (Signed)
Patient ID: [name], female   DOB: [birth year], 81 y.o.   MRN: [id number]     Date:  11/28/2014   ID:  [name], DOB 1931/11/23, MRN [id number]  PCP:  [name], MD  Primary Cardiologist:  Stanford Breed  Chief Complaint  Patient presents with  . Follow-up     History of Present Illness: [name] is a 81 y.o. female admitted in June 2016 with GI bleed status post 2 units of packed red blood cells with multiple AV malformations noted on EGD today by Dr. Deatra Ina who had troponin drawn, no complaints of chest pain, in the setting of hemoglobin of 5.9. She has end-stage renal disease on hemodialysis Monday Wednesday Friday.  Her main complaint in the emergency department was dyspnea, in the setting of anemia and EKG did show anterolateral ischemic changes. Her dyspnea and orthopnea has been present for one week. Hyperkalemia was noted originally.  EKG from 09/23/14 demonstrates sinus rhythm with diffuse ST segment depression. Possibly global ischemia in the setting of severe anemia. Prior EKG shows subtle ST segment changes but certainly her most recent EKGs are accentuated. Normal ejection fraction in 2014 echocardiogram. Nuclear stress test 08/02/12 was normal. Echo on September 24, 2014: EF 50% mild LVH, mild MR, LA mod to severely dilated. Peak PA pressure 53mmHg.  She presented for post-hospital follow up a month ago. Since then she had an EGD on 11/12/2014 which revealed bleeding vascular ectasia.  She was continued on PPI with plans for repeat EGD in 2-3 weeks. This is not been scheduled yet.  Her hemoglobin was 8.5 on 11/12/2014 and the last one was 8.7 at hemodialysis.  She reports feeling well no chest pain and shortness of breath nausea, vomiting, fever. She reports she feels like she has good energy levels.      The patient currently orthopnea, dizziness, PND, cough, congestion, abdominal pain, hematochezia, melena, lower extremity edema, claudication.     Wt Readings from  Last 3 Encounters:  11/28/14 108 lb 14.4 oz (49.397 kg)  11/12/14 107 lb (48.535 kg)  10/29/14 107 lb (48.535 kg)     Past Medical History  Diagnosis Date  . Hyperlipidemia   . Hypertension   . Anxiety   . Facial droop 1935    acquired during forceps delivery, some residual R visual loss  . Hx of breast cancer 2004  . Arthritis     mild in lower back (Drs Posey Pronto and Joelyn Oms)  . GERD (gastroesophageal reflux disease)   . Cardiac murmur     a. thought due to AVF (2D echo 06/2012 without significant valvular disease).   . Bradycardia     a. Noted 06/2012.  Marland Kitchen Hypotension     a. Associated w/ dialysis.  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 [name])  . Osteoporosis 11/2013    DEXA -3.4 radius  . GAVE (gastric antral vascular ectasia) 08/2014    s/p EGD with APC  . Anemia in chronic kidney disease     IV iron infusion  . End stage renal disease on dialysis     HD M,W,F [place] AVF now LUE AVF  . Cancer     Left Breast 12 yrs. ago  . Hearing aid worn     Bilateral  . Sight impaired     Wears contact in Left Eye    Current Outpatient Prescriptions  Medication Sig Dispense Refill  . acetaminophen (TYLENOL) 500  MG tablet Take 1,000 mg by mouth every 6 (six) hours as needed for moderate pain.    Marland Kitchen amLODipine (NORVASC) 5 MG tablet TAKE 1 TABLET (5 MG TOTAL) BY MOUTH AT BEDTIME DAILY 30 tablet 11  . Artificial Tear Ointment (REFRESH P.M. OP) Place 1 application into the right eye daily.    . B Complex-C-Folic Acid (RENA-VITE PO) Take 1 tablet by mouth daily with breakfast.     . Calcium Carbonate Antacid (TUMS ULTRA 1000 PO) Take 2,000 mg by mouth 3 (three) times daily with meals.     . cholecalciferol (VITAMIN D) 1000 UNITS tablet Take 1,000 Units by mouth every Monday, Wednesday, and Friday.     . pantoprazole (PROTONIX) 40 MG tablet Take 1 tablet (40 mg total) by mouth 2 (two) times daily. 30 tablet 5  . simvastatin (ZOCOR) 20 MG tablet  TAKE  ONE TABLET BY MOUTH NIGHTLY AT BEDTIME 30 tablet 3   No current facility-administered medications for this visit.    Allergies:    Allergies  Allergen Reactions  . Sulfa Antibiotics Rash    Social History:  The patient  reports that she quit smoking about 61 years ago. Her smoking use included Cigarettes. She quit after 2 years of use. She has never used smokeless tobacco. She reports that she drinks alcohol. She reports that she does not use illicit drugs.   Family history:   Family History  Problem Relation Age of Onset  . Heart disease Brother     Congenital  . Hypertension Brother   . Diabetes Brother   . Cancer Father     stomach  . CAD Father     MI at age 43  . Heart disease Father   . Heart attack Father   . Cancer Sister     female (uterus?)  . Hypertension Mother     ROS:  Please see the history of present illness.  All other systems reviewed and negative.   PHYSICAL EXAM: VS:  BP 152/62 mmHg  Pulse 80  Ht 4\' 11"  (1.499 m)  Wt 108 lb 14.4 oz (49.397 kg)  BMI 21.98 kg/m2 Well nourished, well developed, in no acute distress  HEENT: Pupils are equal round react to light accommodation extraocular movements are intact.  Neck: no JVD No cervical lymphadenopathy. Cardiac: Regular rate and rhythm with 2/6 blowing MM in the axillae.  Lungs: clear to auscultation bilaterally, no wheezing, rhonchi or rales  Abd: soft, nontender, positive bowel sounds all quadrants, no hepatosplenomegaly  Ext: no lower extremity edema. 2+ radial and 1+ dorsalis pedis pulses. Skin: warm and dry  Neuro: Grossly normal   ASSESSMENT AND PLAN:  Problem List Items Addressed This Visit    NSTEMI (non-ST elevated myocardial infarction) - Primary   Relevant Orders   Myocardial Perfusion Imaging   Hypertension   Hyperlipidemia   End stage renal disease on dialysis     N STEMI This was likely related to demand ischemia in the setting of severe anemia. Her Hgb appears to be  stable over the last month. I think we can proceed with Lexiscan stress testing now to rule out underlying CAD  Hyperlipidemia  Continue statin  Essential hypertension: She reports her blood pressure always goes up when she comes to the doctor's office. She says it is usually well controlled. Continue amlodipine  Stage renal disease on dialysis Monday Wednesday Friday

## 2014-11-28 NOTE — Patient Instructions (Signed)
Schedule Lexiscan Myoview. 

## 2014-11-29 DIAGNOSIS — N2581 Secondary hyperparathyroidism of renal origin: Secondary | ICD-10-CM | POA: Diagnosis not present

## 2014-11-29 DIAGNOSIS — D631 Anemia in chronic kidney disease: Secondary | ICD-10-CM | POA: Diagnosis not present

## 2014-11-29 DIAGNOSIS — N186 End stage renal disease: Secondary | ICD-10-CM | POA: Diagnosis not present

## 2014-12-03 ENCOUNTER — Telehealth: Payer: Self-pay | Admitting: Gastroenterology

## 2014-12-03 ENCOUNTER — Other Ambulatory Visit: Payer: Self-pay

## 2014-12-03 DIAGNOSIS — I999 Unspecified disorder of circulatory system: Secondary | ICD-10-CM

## 2014-12-03 DIAGNOSIS — I789 Disease of capillaries, unspecified: Secondary | ICD-10-CM

## 2014-12-03 NOTE — Telephone Encounter (Signed)
Spoke with the patient. She is scheduled for 12/26/14 arrive at 8:00 am. Advised her I am looking for an earlier appointment, but there have not been any cancellations. Dr Clover Mealy from the [place] told her the hgb is at 8.8

## 2014-12-10 ENCOUNTER — Encounter (HOSPITAL_COMMUNITY): Payer: Medicare Other

## 2014-12-12 ENCOUNTER — Telehealth (HOSPITAL_COMMUNITY): Payer: Self-pay

## 2014-12-12 NOTE — Telephone Encounter (Signed)
Encounter complete. 

## 2014-12-13 ENCOUNTER — Telehealth (HOSPITAL_COMMUNITY): Payer: Self-pay

## 2014-12-13 NOTE — Telephone Encounter (Signed)
Encounter complete. 

## 2014-12-16 ENCOUNTER — Encounter (HOSPITAL_COMMUNITY): Payer: Self-pay | Admitting: *Deleted

## 2014-12-17 ENCOUNTER — Ambulatory Visit (HOSPITAL_COMMUNITY)
Admission: RE | Admit: 2014-12-17 | Discharge: 2014-12-17 | Disposition: A | Discharge status: Home / Self Care | Payer: Medicare Other | Source: Ambulatory Visit | Attending: Cardiovascular Disease | Admitting: Cardiovascular Disease

## 2014-12-17 DIAGNOSIS — E1122 Type 2 diabetes mellitus with diabetic chronic kidney disease: Secondary | ICD-10-CM | POA: Diagnosis not present

## 2014-12-17 DIAGNOSIS — I214 Non-ST elevation (NSTEMI) myocardial infarction: Secondary | ICD-10-CM

## 2014-12-17 DIAGNOSIS — Z87891 Personal history of nicotine dependence: Secondary | ICD-10-CM | POA: Diagnosis not present

## 2014-12-17 DIAGNOSIS — R011 Cardiac murmur, unspecified: Secondary | ICD-10-CM | POA: Diagnosis not present

## 2014-12-17 DIAGNOSIS — I252 Old myocardial infarction: Secondary | ICD-10-CM | POA: Insufficient documentation

## 2014-12-17 DIAGNOSIS — I2510 Atherosclerotic heart disease of native coronary artery without angina pectoris: Secondary | ICD-10-CM | POA: Diagnosis not present

## 2014-12-17 DIAGNOSIS — N186 End stage renal disease: Secondary | ICD-10-CM | POA: Diagnosis not present

## 2014-12-17 DIAGNOSIS — I120 Hypertensive chronic kidney disease with stage 5 chronic kidney disease or end stage renal disease: Secondary | ICD-10-CM | POA: Insufficient documentation

## 2014-12-17 DIAGNOSIS — R5383 Other fatigue: Secondary | ICD-10-CM | POA: Insufficient documentation

## 2014-12-17 DIAGNOSIS — R9439 Abnormal result of other cardiovascular function study: Secondary | ICD-10-CM | POA: Diagnosis not present

## 2014-12-17 DIAGNOSIS — Z8249 Family history of ischemic heart disease and other diseases of the circulatory system: Secondary | ICD-10-CM | POA: Diagnosis not present

## 2014-12-17 LAB — MYOCARDIAL PERFUSION IMAGING
CHL CUP NUCLEAR SDS: 3
CHL CUP RESTING HR STRESS: 80 {beats}/min
CSEPPHR: 89 {beats}/min
LV dias vol: 122 mL
LVSYSVOL: 64 mL
SRS: 6
SSS: 9
TID: 1.19

## 2014-12-17 MED ORDER — TECHNETIUM TC 99M SESTAMIBI GENERIC - CARDIOLITE
31.3000 | Freq: Once | INTRAVENOUS | Status: AC | PRN
  Administered 2014-12-17: 31.3 via INTRAVENOUS

## 2014-12-17 MED ORDER — REGADENOSON 0.4 MG/5ML IV SOLN
0.4000 mg | Freq: Once | INTRAVENOUS | Status: AC
  Administered 2014-12-17: 0.4 mg via INTRAVENOUS

## 2014-12-17 MED ORDER — TECHNETIUM TC 99M SESTAMIBI GENERIC - CARDIOLITE
10.6000 | Freq: Once | INTRAVENOUS | Status: AC | PRN
  Administered 2014-12-17: 11 via INTRAVENOUS

## 2014-12-18 DIAGNOSIS — E1129 Type 2 diabetes mellitus with other diabetic kidney complication: Secondary | ICD-10-CM | POA: Diagnosis not present

## 2014-12-22 ENCOUNTER — Encounter: Payer: Self-pay | Admitting: Family Medicine

## 2014-12-24 DIAGNOSIS — N186 End stage renal disease: Secondary | ICD-10-CM | POA: Diagnosis not present

## 2014-12-24 DIAGNOSIS — Z992 Dependence on renal dialysis: Secondary | ICD-10-CM | POA: Diagnosis not present

## 2014-12-24 DIAGNOSIS — T82318A Breakdown (mechanical) of other vascular grafts, initial encounter: Secondary | ICD-10-CM | POA: Diagnosis not present

## 2014-12-24 DIAGNOSIS — E785 Hyperlipidemia, unspecified: Secondary | ICD-10-CM | POA: Diagnosis not present

## 2014-12-24 DIAGNOSIS — I10 Essential (primary) hypertension: Secondary | ICD-10-CM | POA: Diagnosis not present

## 2014-12-24 DIAGNOSIS — T82818A Embolism of vascular prosthetic devices, implants and grafts, initial encounter: Secondary | ICD-10-CM | POA: Diagnosis not present

## 2014-12-24 DIAGNOSIS — Y841 Kidney dialysis as the cause of abnormal reaction of the patient, or of later complication, without mention of misadventure at the time of the procedure: Secondary | ICD-10-CM | POA: Diagnosis not present

## 2014-12-26 ENCOUNTER — Ambulatory Visit (HOSPITAL_COMMUNITY): Payer: Medicare Other | Admitting: Certified Registered Nurse Anesthetist

## 2014-12-26 ENCOUNTER — Encounter (HOSPITAL_COMMUNITY): Payer: Self-pay | Admitting: Gastroenterology

## 2014-12-26 ENCOUNTER — Encounter (HOSPITAL_COMMUNITY)
Admission: RE | Disposition: A | Discharge status: Home / Self Care | Payer: Self-pay | Source: Ambulatory Visit | Attending: Gastroenterology

## 2014-12-26 ENCOUNTER — Ambulatory Visit (HOSPITAL_COMMUNITY)
Admission: RE | Admit: 2014-12-26 | Discharge: 2014-12-26 | Disposition: A | Discharge status: Home / Self Care | Payer: Medicare Other | Source: Ambulatory Visit | Attending: Gastroenterology | Admitting: Gastroenterology

## 2014-12-26 DIAGNOSIS — I120 Hypertensive chronic kidney disease with stage 5 chronic kidney disease or end stage renal disease: Secondary | ICD-10-CM | POA: Diagnosis not present

## 2014-12-26 DIAGNOSIS — I509 Heart failure, unspecified: Secondary | ICD-10-CM | POA: Insufficient documentation

## 2014-12-26 DIAGNOSIS — K219 Gastro-esophageal reflux disease without esophagitis: Secondary | ICD-10-CM | POA: Insufficient documentation

## 2014-12-26 DIAGNOSIS — K31811 Angiodysplasia of stomach and duodenum with bleeding: Secondary | ICD-10-CM | POA: Diagnosis not present

## 2014-12-26 DIAGNOSIS — N186 End stage renal disease: Secondary | ICD-10-CM | POA: Diagnosis not present

## 2014-12-26 DIAGNOSIS — K3189 Other diseases of stomach and duodenum: Secondary | ICD-10-CM | POA: Diagnosis not present

## 2014-12-26 DIAGNOSIS — I999 Unspecified disorder of circulatory system: Secondary | ICD-10-CM

## 2014-12-26 DIAGNOSIS — Z992 Dependence on renal dialysis: Secondary | ICD-10-CM | POA: Insufficient documentation

## 2014-12-26 DIAGNOSIS — K31819 Angiodysplasia of stomach and duodenum without bleeding: Secondary | ICD-10-CM | POA: Diagnosis present

## 2014-12-26 DIAGNOSIS — M1990 Unspecified osteoarthritis, unspecified site: Secondary | ICD-10-CM | POA: Insufficient documentation

## 2014-12-26 DIAGNOSIS — I252 Old myocardial infarction: Secondary | ICD-10-CM | POA: Insufficient documentation

## 2014-12-26 DIAGNOSIS — Z87891 Personal history of nicotine dependence: Secondary | ICD-10-CM | POA: Insufficient documentation

## 2014-12-26 DIAGNOSIS — I789 Disease of capillaries, unspecified: Secondary | ICD-10-CM

## 2014-12-26 DIAGNOSIS — E785 Hyperlipidemia, unspecified: Secondary | ICD-10-CM | POA: Insufficient documentation

## 2014-12-26 HISTORY — PX: ESOPHAGOGASTRODUODENOSCOPY: SHX5428

## 2014-12-26 SURGERY — EGD (ESOPHAGOGASTRODUODENOSCOPY)
Anesthesia: Monitor Anesthesia Care

## 2014-12-26 MED ORDER — SODIUM CHLORIDE 0.9 % IV SOLN
INTRAVENOUS | Status: DC
  Administered 2014-12-26: 500 mL via INTRAVENOUS

## 2014-12-26 MED ORDER — PROPOFOL 10 MG/ML IV BOLUS
INTRAVENOUS | Status: DC | PRN
  Administered 2014-12-26 (×2): 25 mg via INTRAVENOUS
  Administered 2014-12-26: 75 mg via INTRAVENOUS
  Administered 2014-12-26: 20 mg via INTRAVENOUS
  Administered 2014-12-26 (×4): 25 mg via INTRAVENOUS

## 2014-12-26 MED ORDER — PROPOFOL 10 MG/ML IV BOLUS
INTRAVENOUS | Status: AC
  Filled 2014-12-26: qty 20

## 2014-12-26 MED ORDER — PROMETHAZINE HCL 25 MG/ML IJ SOLN: 6.2500 mg | INTRAMUSCULAR | Status: DC | PRN

## 2014-12-26 NOTE — Transfer of Care (Signed)
Immediate Anesthesia Transfer of Care Note  Patient: [name]  Procedure(s) Performed: Procedure(s): ESOPHAGOGASTRODUODENOSCOPY (EGD) (N/A)  Patient Location: PACU  Anesthesia Type:MAC  Level of Consciousness: awake, alert  and oriented  Airway & Oxygen Therapy: Patient Spontanous Breathing  Post-op Assessment: Report given to RN and Post -op Vital signs reviewed and stable  Post vital signs: Reviewed and stable  Last Vitals:  Filed Vitals:   12/26/14 0819  BP: 185/59  Pulse: 80  Temp: 36.4 C  Resp: 21    Complications: No apparent anesthesia complications

## 2014-12-26 NOTE — H&P (Signed)
_                                                                                                                History of Present Illness:  [name] is an 81 year old white female with gave syndrome here for follow-up endoscopy.  She has chronic GI blood loss quiet transfusions.   Past Medical History  Diagnosis Date  . Hyperlipidemia   . Hypertension   . Anxiety   . Facial droop 1935    acquired during forceps delivery, some residual R visual loss  . Hx of breast cancer 2004  . Arthritis     mild in lower back (Drs Posey Pronto and Joelyn Oms)  . GERD (gastroesophageal reflux disease)   . Bradycardia     a. Noted 06/2012.  Marland Kitchen Hypotension     a. Associated w/ dialysis.  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 [name])  . Osteoporosis 11/2013    DEXA -3.4 radius  . GAVE (gastric antral vascular ectasia) 08/2014    s/p EGD with APC  . Anemia in chronic kidney disease     IV iron infusion  . End stage renal disease on dialysis     HD M,W,F [place] AVF now LUE AVF  . Cancer     Left Breast 12 yrs. ago  . Hearing aid worn     Bilateral  . Sight impaired     Wears contact in Left Eye  . Cardiac murmur     a. thought due to AVF (2D echo 06/2012 without significant valvular disease).    Past Surgical History  Procedure Laterality Date  . Cataract extraction  1981  . Av fistula placement  07/20/10    Right brachiocephalic AVF  . Umbilical hernia repair  2011  . Dexa  2013    [facility]  . US echocardiography  06/2012    LVH, EF 65%, grade 1 diastol dysfunction, mod dliated LAD  . Eye surgery  1966    regular cataract  . Breast lumpectomy Left   . Breast biopsy Left   . Bascilic vein transposition Left 07/24/2013    Procedure: BASILIC VEIN TRANSPOSITION;  Surgeon: [name], MD;  Location: [place];  Service: Vascular;  Laterality: Left;  . Shuntogram N/A 04/16/2011    Procedure: Earney Mallet;  Surgeon: [name], MD;   Location: [facility] CATH LAB;  Service: Cardiovascular;  Laterality: N/A;  . Shuntogram Right 07/05/2013    Procedure: FISTULOGRAM;  Surgeon: [name], MD;  Location: [facility] CATH LAB;  Service: Cardiovascular;  Laterality: Right;  . Esophagogastroduodenoscopy N/A 04/09/2014    Procedure: ESOPHAGOGASTRODUODENOSCOPY (EGD);  Surgeon: [name], MD;  Location: [place];  Service: Endoscopy;  Laterality: N/A;  . Peripheral vascular catheterization N/A 08/15/2014    Procedure: A/V Shuntogram/Fistulagram;  Surgeon: [name], MD;  Location: [place] CV LAB;  Service: Cardiovascular;  Laterality:  N/A;  . Esophagogastroduodenoscopy N/A 09/23/2014    Procedure: ESOPHAGOGASTRODUODENOSCOPY (EGD);  Surgeon: [name], MD;  Location: [place];  Service: Endoscopy;  Laterality: N/A;  . Hernia repair    . Esophagogastroduodenoscopy (egd) with propofol N/A 11/12/2014    Procedure: ESOPHAGOGASTRODUODENOSCOPY (EGD) WITH PROPOFOL;  Surgeon: [name], MD;  Location: [facility] ENDOSCOPY;  Service: Endoscopy;  Laterality: N/A;  . Hot hemostasis N/A 11/12/2014    Procedure: HOT HEMOSTASIS (ARGON PLASMA COAGULATION/BICAP);  Surgeon: [name], MD;  Location: [facility];  Service: Endoscopy;  Laterality: N/A;   family history includes CAD in her father; Cancer in her father and sister; Diabetes in her brother; Heart attack in her father; Heart disease in her brother and father; Hypertension in her brother and mother. Current Facility-Administered Medications  Medication Dose Route Frequency Provider Last Rate Last Dose  . 0.9 %  sodium chloride infusion   Intravenous Continuous [name], MD 20 mL/hr at 12/26/14 0841 500 mL at 12/26/14 0841  . promethazine (PHENERGAN) injection 6.25 mg  6.25 mg Intravenous Q15 min PRN Montez Hageman, MD       Allergies as of 12/03/2014 - Review Complete 11/28/2014  Allergen Reaction Noted  . Sulfa antibiotics Rash 12/14/2010    reports that she quit  smoking about 61 years ago. Her smoking use included Cigarettes. She quit after 2 years of use. She has never used smokeless tobacco. She reports that she drinks alcohol. She reports that she does not use illicit drugs.   Review of Systems: Pertinent positive and negative review of systems were noted in the above HPI section. All other review of systems were otherwise negative.  Vital signs were reviewed in today's medical record Physical Exam: General: Well developed , well nourished, no acute distress Skin: anicteric Head: Normocephalic and atraumatic Eyes:  sclerae anicteric, EOMI Ears: Normal auditory acuity Mouth: No deformity or lesions Neck: Supple, no masses or thyromegaly Lymph Nodes: no lymphadenopathy Lungs: Clear throughout to auscultation Heart: Regular rate and rhythm; no murmurs, rubs or bruits Gastroinestinal: Soft, non tender and non distended. No masses, hepatosplenomegaly or hernias noted. Normal Bowel sounds Rectal:deferred Musculoskeletal: Symmetrical with no gross deformities  Skin: No lesions on visible extremities Pulses:  Normal pulses noted Extremities: No clubbing, cyanosis, edema or deformities noted Neurological: Alert oriented x 4, grossly nonfocal Cervical Nodes:  No significant cervical adenopathy Inguinal Nodes: No significant inguinal adenopathy Psychological:  Alert and cooperative. Normal mood and affect  Impression - GAVE syndrome chronic GI blood loss  Plan-EGD with laser obliteration of lesions

## 2014-12-26 NOTE — Discharge Instructions (Signed)
Esophagogastroduodenoscopy °Esophagogastroduodenoscopy (EGD) is a procedure to examine the lining of the esophagus, stomach, and first part of the small intestine (duodenum). A long, flexible, lighted tube with a camera attached (endoscope) is inserted down the throat to view these organs. This procedure is done to detect problems or abnormalities, such as inflammation, bleeding, ulcers, or growths, in order to treat them. The procedure lasts about 5-20 minutes. It is usually an outpatient procedure, but it may need to be performed in emergency cases in the hospital. °LET YOUR CAREGIVER KNOW ABOUT:  °· Allergies to food or medicine. °· All medicines you are taking, including vitamins, herbs, eyedrops, and over-the-counter medicines and creams. °· Use of steroids (by mouth or creams). °· Previous problems you or members of your family have had with the use of anesthetics. °· Any blood disorders you have. °· Previous surgeries you have had. °· Other health problems you have. °· Possibility of pregnancy, if this applies. °RISKS AND COMPLICATIONS  °Generally, EGD is a safe procedure. However, as with any procedure, complications can occur. Possible complications include: °· Infection. °· Bleeding. °· Tearing (perforation) of the esophagus, stomach, or duodenum. °· Difficulty breathing or not being able to breath. °· Excessive sweating. °· Spasms of the larynx. °· Slowed heartbeat. °· Low blood pressure. °BEFORE THE PROCEDURE °· Do not eat or drink anything for 6-8 hours before the procedure or as directed by your caregiver. °· Ask your caregiver about changing or stopping your regular medicines. °· If you wear dentures, be prepared to remove them before the procedure. °· Arrange for someone to drive you home after the procedure. °PROCEDURE  °· A vein will be accessed to give medicines and fluids. A medicine to relax you (sedative) and a pain reliever will be given through that access into the vein. °· A numbing medicine  (local anesthetic) may be sprayed on your throat for comfort and to stop you from gagging or coughing. °· A mouth guard may be placed in your mouth to protect your teeth and to keep you from biting on the endoscope. °· You will be asked to lie on your left side. °· The endoscope is inserted down your throat and into the esophagus, stomach, and duodenum. °· Air is put through the endoscope to allow your caregiver to view the lining of your esophagus clearly. °· The esophagus, stomach, and duodenum is then examined. During the exam, your caregiver may: °¨ Remove tissue to be examined under a microscope (biopsy) for inflammation, infection, or other medical problems. °¨ Remove growths. °¨ Remove objects (foreign bodies) that are stuck. °¨ Treat any bleeding with medicines or other devices that stop tissues from bleeding (hot cautery, clipping devices). °¨ Widen (dilate) or stretch narrowed areas of the esophagus and stomach. °· The endoscope will then be withdrawn. °AFTER THE PROCEDURE °· You will be taken to a recovery area to be monitored. You will be able to go home once you are stable and alert. °· Do not eat or drink anything until the local anesthetic and numbing medicines have worn off. You may choke. °· It is normal to feel bloated, have pain with swallowing, or have a sore throat for a short time. This will wear off. °· Your caregiver should be able to discuss his or her findings with you. It will take longer to discuss the test results if any biopsies were taken. °Document Released: 07/16/2004 Document Revised: 07/30/2013 Document Reviewed: 02/16/2012 °[employer]® Patient Information ©2015 [employer]. This information is not   intended to replace advice given to you by your health care provider. Make sure you discuss any questions you have with your health care provider. ° °

## 2014-12-26 NOTE — Anesthesia Postprocedure Evaluation (Signed)
  Anesthesia Post-op Note  Patient: [name]  Procedure(s) Performed: Procedure(s) (LRB): ESOPHAGOGASTRODUODENOSCOPY (EGD) (N/A)  Patient Location: PACU  Anesthesia Type: MAC  Level of Consciousness: awake and alert   Airway and Oxygen Therapy: Patient Spontanous Breathing  Post-op Pain: mild  Post-op Assessment: Post-op Vital signs reviewed, Patient's Cardiovascular Status Stable, Respiratory Function Stable, Patent Airway and No signs of Nausea or vomiting  Last Vitals:  Filed Vitals:   12/26/14 1030  BP: 134/46  Pulse: 78  Temp:   Resp: 22    Post-op Vital Signs: stable   Complications: No apparent anesthesia complications

## 2014-12-26 NOTE — Op Note (Signed)
[facility] [place], [address]   ENDOSCOPY PROCEDURE REPORT  PATIENT: [name], [name]  MR#: [id number] BIRTHDATE: Sep 18, 1931 , 82  yrs. old GENDER: female ENDOSCOPIST: [name], MD REFERRED BY: PROCEDURE DATE:  12/26/2014 PROCEDURE:  EGD w/ control of bleeding , EGD w/ biopsy , and EGD w/ ablation ASA CLASS:     Class III INDICATIONS:  history of gave syndrome with bleeding ectasia. MEDICATIONS: Monitored anesthesia care TOPICAL ANESTHETIC:  DESCRIPTION OF PROCEDURE: After the risks benefits and alternatives of the procedure were thoroughly explained, informed consent was obtained.  The Pentax Gastroscope [id number] endoscope was introduced through the mouth and advanced to the second portion of the duodenum , Without limitations.  The instrument was slowly withdrawn as the mucosa was fully examined.    There are multiple bleeding gastric vascular ectasia in the gastric antrum.  These areas were cauterized utilizing the argon plasma coagulator.  At the conclusion of the procedure there was no further active bleeding.  In the prepyloric antrum there was a large fold characterized by edematous because.  Biopsies were taken.  Retroflexed views revealed no abnormalities.     The scope was then withdrawn from the patient and the procedure completed.  COMPLICATIONS: There were no immediate complications.  ENDOSCOPIC IMPRESSION: 1.   There are multiple bleeding gastric vascular ectasia in the gastric antrum.  These areas were cauterized utilizing the argon plasma coagulator.  At the conclusion of the procedure there was no further active bleeding.  In the prepyloric antrum there was a large fold characterized by edematous because.  Biopsies were taken 2.   EGD was otherwise normal  RECOMMENDATIONS: repeat EGD in 8 weeks  REPEAT EXAM:  eSigned:  [name], MD 12/26/2014 10:11 AM    CC:  PATIENT NAME:  [name], [name] MR#:  [id number]

## 2014-12-26 NOTE — Anesthesia Preprocedure Evaluation (Addendum)
Anesthesia Evaluation  Patient identified by MRN, date of birth, ID band Patient awake    Reviewed: Allergy & Precautions, NPO status , Patient's Chart, lab work & pertinent test results  Airway Mallampati: II  TM Distance: >3 FB Neck ROM: Full    Dental no notable dental hx.    Pulmonary former smoker,    Pulmonary exam normal breath sounds clear to auscultation       Cardiovascular hypertension, Pt. on medications + Past MI and +CHF  Normal cardiovascular exam Rhythm:Regular Rate:Normal     Neuro/Psych PSYCHIATRIC DISORDERS Anxiety negative neurological ROS     GI/Hepatic Neg liver ROS, GERD  ,  Endo/Other  negative endocrine ROS  Renal/GU DialysisRenal diseaseLast dialysis 12/25/14  negative genitourinary   Musculoskeletal  (+) Arthritis , Osteoarthritis,    Abdominal   Peds negative pediatric ROS (+)  Hematology  (+) Blood dyscrasia, anemia ,   Anesthesia Other Findings   Reproductive/Obstetrics negative OB ROS                            Anesthesia Physical Anesthesia Plan  ASA: IV  Anesthesia Plan: MAC   Post-op Pain Management:    Induction:   Airway Management Planned: Natural Airway  Additional Equipment:   Intra-op Plan:   Post-operative Plan:   Informed Consent: I have reviewed the patients History and Physical, chart, labs and discussed the procedure including the risks, benefits and alternatives for the proposed anesthesia with the patient or authorized representative who has indicated his/her understanding and acceptance.   Dental advisory given  Plan Discussed with: CRNA  Anesthesia Plan Comments:                                          Anesthesia Evaluation  Patient identified by MRN, date of birth, ID band Patient awake    Reviewed: Allergy & Precautions, NPO status , Patient's Chart, lab work & pertinent test results  Airway Mallampati:  II  TM Distance: >3 FB Neck ROM: Full    Dental no notable dental hx.    Pulmonary neg pulmonary ROS, former smoker,  breath sounds clear to auscultation  Pulmonary exam normal       Cardiovascular hypertension, Pt. on medications + Past MI and +CHF Normal cardiovascular examRhythm:Regular Rate:Normal     Neuro/Psych negative neurological ROS  negative psych ROS   GI/Hepatic negative GI ROS, Neg liver ROS,   Endo/Other  negative endocrine ROS  Renal/GU DialysisRenal disease  negative genitourinary   Musculoskeletal negative musculoskeletal ROS (+)   Abdominal   Peds negative pediatric ROS (+)  Hematology negative hematology ROS (+)   Anesthesia Other Findings   Reproductive/Obstetrics negative OB ROS                             Anesthesia Physical Anesthesia Plan  ASA: IV  Anesthesia Plan: MAC   Post-op Pain Management:    Induction:   Airway Management Planned: Natural Airway  Additional Equipment:   Intra-op Plan:   Post-operative Plan: Extubation in OR  Informed Consent: I have reviewed the patients History and Physical, chart, labs and discussed the procedure including the risks, benefits and alternatives for the proposed anesthesia with the patient or authorized representative who has indicated his/her understanding and acceptance.   Dental  advisory given  Plan Discussed with: CRNA  Anesthesia Plan Comments:         Anesthesia Quick Evaluation  Anesthesia Quick Evaluation

## 2014-12-27 ENCOUNTER — Encounter (HOSPITAL_COMMUNITY): Payer: Self-pay | Admitting: Family Medicine

## 2014-12-27 ENCOUNTER — Telehealth: Payer: Self-pay | Admitting: Cardiology

## 2014-12-27 DIAGNOSIS — N186 End stage renal disease: Secondary | ICD-10-CM | POA: Diagnosis not present

## 2014-12-27 DIAGNOSIS — Z992 Dependence on renal dialysis: Secondary | ICD-10-CM | POA: Diagnosis not present

## 2014-12-27 DIAGNOSIS — E1129 Type 2 diabetes mellitus with other diabetic kidney complication: Secondary | ICD-10-CM | POA: Diagnosis not present

## 2014-12-27 NOTE — Telephone Encounter (Signed)
Given results of stress test.  Reminded of appointment with Dr. Stanford Breed November 29th at 0800

## 2014-12-27 NOTE — Telephone Encounter (Signed)
New message      Calling for stress test results

## 2014-12-30 DIAGNOSIS — N186 End stage renal disease: Secondary | ICD-10-CM | POA: Diagnosis not present

## 2014-12-30 DIAGNOSIS — N2581 Secondary hyperparathyroidism of renal origin: Secondary | ICD-10-CM | POA: Diagnosis not present

## 2014-12-30 DIAGNOSIS — D631 Anemia in chronic kidney disease: Secondary | ICD-10-CM | POA: Diagnosis not present

## 2014-12-30 DIAGNOSIS — Z23 Encounter for immunization: Secondary | ICD-10-CM | POA: Diagnosis not present

## 2014-12-30 DIAGNOSIS — D508 Other iron deficiency anemias: Secondary | ICD-10-CM | POA: Diagnosis not present

## 2015-01-01 DIAGNOSIS — D508 Other iron deficiency anemias: Secondary | ICD-10-CM | POA: Diagnosis not present

## 2015-01-01 DIAGNOSIS — D631 Anemia in chronic kidney disease: Secondary | ICD-10-CM | POA: Diagnosis not present

## 2015-01-01 DIAGNOSIS — N186 End stage renal disease: Secondary | ICD-10-CM | POA: Diagnosis not present

## 2015-01-01 DIAGNOSIS — N2581 Secondary hyperparathyroidism of renal origin: Secondary | ICD-10-CM | POA: Diagnosis not present

## 2015-01-01 DIAGNOSIS — Z23 Encounter for immunization: Secondary | ICD-10-CM | POA: Diagnosis not present

## 2015-01-03 DIAGNOSIS — N2581 Secondary hyperparathyroidism of renal origin: Secondary | ICD-10-CM | POA: Diagnosis not present

## 2015-01-03 DIAGNOSIS — D508 Other iron deficiency anemias: Secondary | ICD-10-CM | POA: Diagnosis not present

## 2015-01-03 DIAGNOSIS — N186 End stage renal disease: Secondary | ICD-10-CM | POA: Diagnosis not present

## 2015-01-03 DIAGNOSIS — Z23 Encounter for immunization: Secondary | ICD-10-CM | POA: Diagnosis not present

## 2015-01-03 DIAGNOSIS — D631 Anemia in chronic kidney disease: Secondary | ICD-10-CM | POA: Diagnosis not present

## 2015-01-06 ENCOUNTER — Encounter: Payer: Self-pay | Admitting: Gastroenterology

## 2015-01-06 DIAGNOSIS — N2581 Secondary hyperparathyroidism of renal origin: Secondary | ICD-10-CM | POA: Diagnosis not present

## 2015-01-06 DIAGNOSIS — N186 End stage renal disease: Secondary | ICD-10-CM | POA: Diagnosis not present

## 2015-01-06 DIAGNOSIS — D631 Anemia in chronic kidney disease: Secondary | ICD-10-CM | POA: Diagnosis not present

## 2015-01-06 DIAGNOSIS — Z23 Encounter for immunization: Secondary | ICD-10-CM | POA: Diagnosis not present

## 2015-01-06 DIAGNOSIS — D508 Other iron deficiency anemias: Secondary | ICD-10-CM | POA: Diagnosis not present

## 2015-01-08 DIAGNOSIS — D631 Anemia in chronic kidney disease: Secondary | ICD-10-CM | POA: Diagnosis not present

## 2015-01-08 DIAGNOSIS — Z23 Encounter for immunization: Secondary | ICD-10-CM | POA: Diagnosis not present

## 2015-01-08 DIAGNOSIS — N186 End stage renal disease: Secondary | ICD-10-CM | POA: Diagnosis not present

## 2015-01-08 DIAGNOSIS — D508 Other iron deficiency anemias: Secondary | ICD-10-CM | POA: Diagnosis not present

## 2015-01-08 DIAGNOSIS — N2581 Secondary hyperparathyroidism of renal origin: Secondary | ICD-10-CM | POA: Diagnosis not present

## 2015-01-10 DIAGNOSIS — N2581 Secondary hyperparathyroidism of renal origin: Secondary | ICD-10-CM | POA: Diagnosis not present

## 2015-01-10 DIAGNOSIS — D631 Anemia in chronic kidney disease: Secondary | ICD-10-CM | POA: Diagnosis not present

## 2015-01-10 DIAGNOSIS — Z23 Encounter for immunization: Secondary | ICD-10-CM | POA: Diagnosis not present

## 2015-01-10 DIAGNOSIS — D508 Other iron deficiency anemias: Secondary | ICD-10-CM | POA: Diagnosis not present

## 2015-01-10 DIAGNOSIS — N186 End stage renal disease: Secondary | ICD-10-CM | POA: Diagnosis not present

## 2015-01-13 DIAGNOSIS — N2581 Secondary hyperparathyroidism of renal origin: Secondary | ICD-10-CM | POA: Diagnosis not present

## 2015-01-13 DIAGNOSIS — N186 End stage renal disease: Secondary | ICD-10-CM | POA: Diagnosis not present

## 2015-01-13 DIAGNOSIS — D631 Anemia in chronic kidney disease: Secondary | ICD-10-CM | POA: Diagnosis not present

## 2015-01-13 DIAGNOSIS — D508 Other iron deficiency anemias: Secondary | ICD-10-CM | POA: Diagnosis not present

## 2015-01-13 DIAGNOSIS — Z23 Encounter for immunization: Secondary | ICD-10-CM | POA: Diagnosis not present

## 2015-01-15 DIAGNOSIS — D508 Other iron deficiency anemias: Secondary | ICD-10-CM | POA: Diagnosis not present

## 2015-01-15 DIAGNOSIS — N186 End stage renal disease: Secondary | ICD-10-CM | POA: Diagnosis not present

## 2015-01-15 DIAGNOSIS — Z23 Encounter for immunization: Secondary | ICD-10-CM | POA: Diagnosis not present

## 2015-01-15 DIAGNOSIS — N2581 Secondary hyperparathyroidism of renal origin: Secondary | ICD-10-CM | POA: Diagnosis not present

## 2015-01-15 DIAGNOSIS — D631 Anemia in chronic kidney disease: Secondary | ICD-10-CM | POA: Diagnosis not present

## 2015-01-17 DIAGNOSIS — D631 Anemia in chronic kidney disease: Secondary | ICD-10-CM | POA: Diagnosis not present

## 2015-01-17 DIAGNOSIS — D508 Other iron deficiency anemias: Secondary | ICD-10-CM | POA: Diagnosis not present

## 2015-01-17 DIAGNOSIS — N2581 Secondary hyperparathyroidism of renal origin: Secondary | ICD-10-CM | POA: Diagnosis not present

## 2015-01-17 DIAGNOSIS — N186 End stage renal disease: Secondary | ICD-10-CM | POA: Diagnosis not present

## 2015-01-17 DIAGNOSIS — Z23 Encounter for immunization: Secondary | ICD-10-CM | POA: Diagnosis not present

## 2015-01-20 DIAGNOSIS — D508 Other iron deficiency anemias: Secondary | ICD-10-CM | POA: Diagnosis not present

## 2015-01-20 DIAGNOSIS — D631 Anemia in chronic kidney disease: Secondary | ICD-10-CM | POA: Diagnosis not present

## 2015-01-20 DIAGNOSIS — N186 End stage renal disease: Secondary | ICD-10-CM | POA: Diagnosis not present

## 2015-01-20 DIAGNOSIS — N2581 Secondary hyperparathyroidism of renal origin: Secondary | ICD-10-CM | POA: Diagnosis not present

## 2015-01-20 DIAGNOSIS — Z23 Encounter for immunization: Secondary | ICD-10-CM | POA: Diagnosis not present

## 2015-01-22 DIAGNOSIS — Z23 Encounter for immunization: Secondary | ICD-10-CM | POA: Diagnosis not present

## 2015-01-22 DIAGNOSIS — N186 End stage renal disease: Secondary | ICD-10-CM | POA: Diagnosis not present

## 2015-01-22 DIAGNOSIS — N2581 Secondary hyperparathyroidism of renal origin: Secondary | ICD-10-CM | POA: Diagnosis not present

## 2015-01-22 DIAGNOSIS — D631 Anemia in chronic kidney disease: Secondary | ICD-10-CM | POA: Diagnosis not present

## 2015-01-22 DIAGNOSIS — D508 Other iron deficiency anemias: Secondary | ICD-10-CM | POA: Diagnosis not present

## 2015-01-24 DIAGNOSIS — N2581 Secondary hyperparathyroidism of renal origin: Secondary | ICD-10-CM | POA: Diagnosis not present

## 2015-01-24 DIAGNOSIS — Z23 Encounter for immunization: Secondary | ICD-10-CM | POA: Diagnosis not present

## 2015-01-24 DIAGNOSIS — D631 Anemia in chronic kidney disease: Secondary | ICD-10-CM | POA: Diagnosis not present

## 2015-01-24 DIAGNOSIS — D508 Other iron deficiency anemias: Secondary | ICD-10-CM | POA: Diagnosis not present

## 2015-01-24 DIAGNOSIS — N186 End stage renal disease: Secondary | ICD-10-CM | POA: Diagnosis not present

## 2015-01-27 ENCOUNTER — Ambulatory Visit (INDEPENDENT_AMBULATORY_CARE_PROVIDER_SITE_OTHER): Payer: Medicare Other | Admitting: Gastroenterology

## 2015-01-27 ENCOUNTER — Encounter: Payer: Self-pay | Admitting: Gastroenterology

## 2015-01-27 VITALS — BP 160/60 | HR 72 | Ht 59.0 in | Wt 112.1 lb

## 2015-01-27 DIAGNOSIS — N186 End stage renal disease: Secondary | ICD-10-CM | POA: Diagnosis not present

## 2015-01-27 DIAGNOSIS — Z992 Dependence on renal dialysis: Secondary | ICD-10-CM | POA: Diagnosis not present

## 2015-01-27 DIAGNOSIS — D631 Anemia in chronic kidney disease: Secondary | ICD-10-CM | POA: Diagnosis not present

## 2015-01-27 DIAGNOSIS — I248 Other forms of acute ischemic heart disease: Secondary | ICD-10-CM | POA: Diagnosis not present

## 2015-01-27 DIAGNOSIS — N2581 Secondary hyperparathyroidism of renal origin: Secondary | ICD-10-CM | POA: Diagnosis not present

## 2015-01-27 DIAGNOSIS — D500 Iron deficiency anemia secondary to blood loss (chronic): Secondary | ICD-10-CM

## 2015-01-27 DIAGNOSIS — K31819 Angiodysplasia of stomach and duodenum without bleeding: Secondary | ICD-10-CM | POA: Diagnosis not present

## 2015-01-27 DIAGNOSIS — Z23 Encounter for immunization: Secondary | ICD-10-CM | POA: Diagnosis not present

## 2015-01-27 DIAGNOSIS — D508 Other iron deficiency anemias: Secondary | ICD-10-CM | POA: Diagnosis not present

## 2015-01-27 DIAGNOSIS — E1129 Type 2 diabetes mellitus with other diabetic kidney complication: Secondary | ICD-10-CM | POA: Diagnosis not present

## 2015-01-27 NOTE — Patient Instructions (Signed)
Follow up in 1 year.

## 2015-01-27 NOTE — Progress Notes (Signed)
[name]    [id number]    1931/10/07  Primary Care Physician:Javier Danise Mina, MD  Referring Physician: [name], MD [address] [place], [place] [address]  Chief complaint:  Iron deficiency, chronic GI blood loss, GAVE  HPI: 81 yr F with h/o ESRD on HD M_W_F with h/o GAVE here for follow up. She feels well overall. She is getting Iron infusions during dialysis. No complaints at this visit. Denies any nausea, vomiting, abdominal pain, melena or bright red blood per rectum    Outpatient Encounter Prescriptions as of 01/27/2015  Medication Sig  . acetaminophen (TYLENOL) 500 MG tablet Take 1,000 mg by mouth every 6 (six) hours as needed for moderate pain.  Marland Kitchen amLODipine (NORVASC) 5 MG tablet TAKE 1 TABLET (5 MG TOTAL) BY MOUTH AT BEDTIME DAILY (Patient taking differently: Take 2.5 mg by mouth daily. TAKE 1 TABLET (5 MG TOTAL) BY MOUTH AT BEDTIME DAILY)  . Artificial Tear Ointment (REFRESH P.M. OP) Place 1 application into the right eye daily.  . B Complex-C-Folic Acid (RENA-VITE PO) Take 1 tablet by mouth daily with breakfast.   . Calcium Carbonate Antacid (TUMS ULTRA 1000 PO) Take 2,000 mg by mouth 3 (three) times daily with meals.   . cholecalciferol (VITAMIN D) 1000 UNITS tablet Take 1,000 Units by mouth every Monday, Wednesday, and Friday.   . pantoprazole (PROTONIX) 40 MG tablet Take 1 tablet (40 mg total) by mouth 2 (two) times daily.  Marland Kitchen PRESCRIPTION MEDICATION [facility] Anemia Therapy  . simvastatin (ZOCOR) 20 MG tablet TAKE  ONE TABLET BY MOUTH NIGHTLY AT BEDTIME   No facility-administered encounter medications on file as of 01/27/2015.    Allergies as of 01/27/2015 - Review Complete 01/27/2015  Allergen Reaction Noted  . Sulfa antibiotics Rash 12/14/2010    Past Medical History  Diagnosis Date  . Hyperlipidemia   . Hypertension   . Anxiety   . Facial droop 1935    acquired during forceps delivery, some residual R visual loss  . Hx of  breast cancer 2004  . Arthritis     mild in lower back (Drs Posey Pronto and Joelyn Oms)  . GERD (gastroesophageal reflux disease)   . Bradycardia     a. Noted 06/2012.  Marland Kitchen Hypotension     a. Associated w/ dialysis.  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 [name])  . Osteoporosis 11/2013    DEXA -3.4 radius  . GAVE (gastric antral vascular ectasia) 08/2014    s/p EGD with APC  . Anemia in chronic kidney disease     IV iron infusion  . End stage renal disease on dialysis [facility])     HD M,W,F [place] AVF now LUE AVF  . Cancer [facility])     Left Breast 12 yrs. ago  . Hearing aid worn     Bilateral  . Sight impaired     Wears contact in Left Eye  . Cardiac murmur     a. thought due to AVF (2D echo 06/2012 without significant valvular disease).     Past Surgical History  Procedure Laterality Date  . Cataract extraction  1981  . Av fistula placement  07/20/10    Right brachiocephalic AVF  . Umbilical hernia repair  2011  . Dexa  2013    [facility]  . US echocardiography  06/2012    LVH, EF 65%, grade 1 diastol dysfunction, mod  dliated LAD  . Eye surgery  1966    regular cataract  . Breast lumpectomy Left   . Breast biopsy Left   . Bascilic vein transposition Left 07/24/2013    Procedure: BASILIC VEIN TRANSPOSITION;  Surgeon: [name], MD;  Location: [place];  Service: Vascular;  Laterality: Left;  . Shuntogram N/A 04/16/2011    Procedure: Earney Mallet;  Surgeon: [name], MD;  Location: [facility] CATH LAB;  Service: Cardiovascular;  Laterality: N/A;  . Shuntogram Right 07/05/2013    Procedure: FISTULOGRAM;  Surgeon: [name], MD;  Location: [facility] CATH LAB;  Service: Cardiovascular;  Laterality: Right;  . Esophagogastroduodenoscopy N/A 04/09/2014    Procedure: ESOPHAGOGASTRODUODENOSCOPY (EGD);  Surgeon: [name], MD;  Location: [place];  Service: Endoscopy;  Laterality: N/A;  . Peripheral vascular catheterization N/A 08/15/2014    Procedure:  A/V Shuntogram/Fistulagram;  Surgeon: [name], MD;  Location: [place] CV LAB;  Service: Cardiovascular;  Laterality: N/A;  . Esophagogastroduodenoscopy N/A 09/23/2014    Procedure: ESOPHAGOGASTRODUODENOSCOPY (EGD);  Surgeon: [name], MD;  Location: [place];  Service: Endoscopy;  Laterality: N/A;  . Hernia repair    . Esophagogastroduodenoscopy (egd) with propofol N/A 11/12/2014    Procedure: ESOPHAGOGASTRODUODENOSCOPY (EGD) WITH PROPOFOL;  Surgeon: [name], MD;  Location: [facility] ENDOSCOPY;  Service: Endoscopy;  Laterality: N/A;  . Hot hemostasis N/A 11/12/2014    Procedure: HOT HEMOSTASIS (ARGON PLASMA COAGULATION/BICAP);  Surgeon: [name], MD;  [facility];  Service: Endoscopy;  Laterality: N/A;  . Esophagogastroduodenoscopy N/A 12/26/2014    neg H pylori, benign biopsies; ESOPHAGOGASTRODUODENOSCOPY (EGD);  Surgeon: [name], MD    Family History  Problem Relation Age of Onset  . Heart disease Brother     Congenital  . Hypertension Brother   . Diabetes Brother   . Cancer Father     stomach  . CAD Father     MI at age 3  . Heart disease Father   . Heart attack Father   . Cancer Sister     female (uterus?)  . Hypertension Mother     Social History   Social History  . Marital Status: Single    Spouse Name: N/A  . Number of Children: N/A  . Years of Education: N/A   Occupational History  . 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 Smoking status: Former Smoker -- 2 years    Types: Cigarettes    Quit date: 03/29/1953  . Smokeless tobacco: Never Used  . Alcohol Use: 0.0 oz/week    0 Standard drinks or equivalent per week     Comment: occasional  . Drug Use: No  . Sexual Activity: Not on file   Other Topics Concern  . Not on file   Social History Narrative   As of 03/2013. Lives alone with her small dog.  Bother and sister in law died last year.      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  HENT: Negative.   Eyes: Negative for blurred vision.  Respiratory: Negative for cough, shortness of breath and wheezing.   Cardiovascular: Negative for chest pain and palpitations.  Gastrointestinal: as per HPI Genitourinary: Negative for dysuria, urgency, frequency and hematuria.  Musculoskeletal: Negative for  myalgias, back pain and joint pain.  Skin: Negative for itching and rash.  Neurological: Negative for dizziness, tremors, focal weakness, seizures and loss of consciousness.  Endo/Heme/Allergies: Negative for environmental allergies.  Psychiatric/Behavioral: Negative for depression, suicidal ideas and hallucinations.  All other systems reviewed and are negative.   Physical Exam: Filed Vitals:   01/27/15 1428  BP: 160/60  Pulse: 72   Gen:      No acute distress HEENT:  EOMI, sclera anicteric Neck:     No masses; no thyromegaly Lungs:    Clear to auscultation bilaterally; normal respiratory effort CV:         Regular rate and rhythm; no murmurs Abd:      + bowel sounds; soft, non-tender; no palpable masses, no distension Ext:    No edema; adequate peripheral perfusion Skin:      Warm and dry; no rash Neuro: alert and oriented x 3 Psych: normal mood and affect  Data Reviewed:  EGD 12/26/14 1. There are multiple bleeding gastric vascular ectasia in the gastric antrum. These areas were cauterized utilizing the argon plasma coagulator. At the conclusion of the procedure there was no further active bleeding. In the prepyloric antrum there was a large fold characterized by edematous because. Biopsies were taken 2. EGD was otherwise normal  Reviewed labs, Hgb stable  Assessment and Plan/Recommendations: 6 yr F with h/o ESRD on HD and GAVE here for follow up Given hemoglobin is stable with no  evidence of overt GI bleed. Will hold off EGD for now. Angio ectasisa likely were successfully ablated with APC Continue iron infusions Return as needed  K. Denzil Magnuson , MD [phone] Mon-Fri 8a-5p [phone] after 5p, weekends, holidays

## 2015-01-29 DIAGNOSIS — D631 Anemia in chronic kidney disease: Secondary | ICD-10-CM | POA: Diagnosis not present

## 2015-01-29 DIAGNOSIS — M955 Acquired deformity of pelvis: Secondary | ICD-10-CM | POA: Diagnosis not present

## 2015-01-29 DIAGNOSIS — N186 End stage renal disease: Secondary | ICD-10-CM | POA: Diagnosis not present

## 2015-01-29 DIAGNOSIS — M9903 Segmental and somatic dysfunction of lumbar region: Secondary | ICD-10-CM | POA: Diagnosis not present

## 2015-01-29 DIAGNOSIS — D508 Other iron deficiency anemias: Secondary | ICD-10-CM | POA: Diagnosis not present

## 2015-01-29 DIAGNOSIS — M9905 Segmental and somatic dysfunction of pelvic region: Secondary | ICD-10-CM | POA: Diagnosis not present

## 2015-01-29 DIAGNOSIS — M5136 Other intervertebral disc degeneration, lumbar region: Secondary | ICD-10-CM | POA: Diagnosis not present

## 2015-01-29 DIAGNOSIS — N2581 Secondary hyperparathyroidism of renal origin: Secondary | ICD-10-CM | POA: Diagnosis not present

## 2015-01-30 DIAGNOSIS — M5136 Other intervertebral disc degeneration, lumbar region: Secondary | ICD-10-CM | POA: Diagnosis not present

## 2015-01-30 DIAGNOSIS — M955 Acquired deformity of pelvis: Secondary | ICD-10-CM | POA: Diagnosis not present

## 2015-01-30 DIAGNOSIS — M9903 Segmental and somatic dysfunction of lumbar region: Secondary | ICD-10-CM | POA: Diagnosis not present

## 2015-01-30 DIAGNOSIS — M9905 Segmental and somatic dysfunction of pelvic region: Secondary | ICD-10-CM | POA: Diagnosis not present

## 2015-01-31 DIAGNOSIS — N2581 Secondary hyperparathyroidism of renal origin: Secondary | ICD-10-CM | POA: Diagnosis not present

## 2015-01-31 DIAGNOSIS — M9905 Segmental and somatic dysfunction of pelvic region: Secondary | ICD-10-CM | POA: Diagnosis not present

## 2015-01-31 DIAGNOSIS — M5136 Other intervertebral disc degeneration, lumbar region: Secondary | ICD-10-CM | POA: Diagnosis not present

## 2015-01-31 DIAGNOSIS — D631 Anemia in chronic kidney disease: Secondary | ICD-10-CM | POA: Diagnosis not present

## 2015-01-31 DIAGNOSIS — M955 Acquired deformity of pelvis: Secondary | ICD-10-CM | POA: Diagnosis not present

## 2015-01-31 DIAGNOSIS — M9903 Segmental and somatic dysfunction of lumbar region: Secondary | ICD-10-CM | POA: Diagnosis not present

## 2015-01-31 DIAGNOSIS — D508 Other iron deficiency anemias: Secondary | ICD-10-CM | POA: Diagnosis not present

## 2015-01-31 DIAGNOSIS — N186 End stage renal disease: Secondary | ICD-10-CM | POA: Diagnosis not present

## 2015-02-03 DIAGNOSIS — N2581 Secondary hyperparathyroidism of renal origin: Secondary | ICD-10-CM | POA: Diagnosis not present

## 2015-02-03 DIAGNOSIS — N186 End stage renal disease: Secondary | ICD-10-CM | POA: Diagnosis not present

## 2015-02-03 DIAGNOSIS — D508 Other iron deficiency anemias: Secondary | ICD-10-CM | POA: Diagnosis not present

## 2015-02-03 DIAGNOSIS — D631 Anemia in chronic kidney disease: Secondary | ICD-10-CM | POA: Diagnosis not present

## 2015-02-04 DIAGNOSIS — M5136 Other intervertebral disc degeneration, lumbar region: Secondary | ICD-10-CM | POA: Diagnosis not present

## 2015-02-04 DIAGNOSIS — M955 Acquired deformity of pelvis: Secondary | ICD-10-CM | POA: Diagnosis not present

## 2015-02-04 DIAGNOSIS — M9903 Segmental and somatic dysfunction of lumbar region: Secondary | ICD-10-CM | POA: Diagnosis not present

## 2015-02-04 DIAGNOSIS — M9905 Segmental and somatic dysfunction of pelvic region: Secondary | ICD-10-CM | POA: Diagnosis not present

## 2015-02-05 DIAGNOSIS — D508 Other iron deficiency anemias: Secondary | ICD-10-CM | POA: Diagnosis not present

## 2015-02-05 DIAGNOSIS — M955 Acquired deformity of pelvis: Secondary | ICD-10-CM | POA: Diagnosis not present

## 2015-02-05 DIAGNOSIS — D631 Anemia in chronic kidney disease: Secondary | ICD-10-CM | POA: Diagnosis not present

## 2015-02-05 DIAGNOSIS — M5136 Other intervertebral disc degeneration, lumbar region: Secondary | ICD-10-CM | POA: Diagnosis not present

## 2015-02-05 DIAGNOSIS — M9903 Segmental and somatic dysfunction of lumbar region: Secondary | ICD-10-CM | POA: Diagnosis not present

## 2015-02-05 DIAGNOSIS — N186 End stage renal disease: Secondary | ICD-10-CM | POA: Diagnosis not present

## 2015-02-05 DIAGNOSIS — N2581 Secondary hyperparathyroidism of renal origin: Secondary | ICD-10-CM | POA: Diagnosis not present

## 2015-02-05 DIAGNOSIS — M9905 Segmental and somatic dysfunction of pelvic region: Secondary | ICD-10-CM | POA: Diagnosis not present

## 2015-02-06 DIAGNOSIS — M5136 Other intervertebral disc degeneration, lumbar region: Secondary | ICD-10-CM | POA: Diagnosis not present

## 2015-02-06 DIAGNOSIS — M955 Acquired deformity of pelvis: Secondary | ICD-10-CM | POA: Diagnosis not present

## 2015-02-06 DIAGNOSIS — M9905 Segmental and somatic dysfunction of pelvic region: Secondary | ICD-10-CM | POA: Diagnosis not present

## 2015-02-06 DIAGNOSIS — M9903 Segmental and somatic dysfunction of lumbar region: Secondary | ICD-10-CM | POA: Diagnosis not present

## 2015-02-07 DIAGNOSIS — N186 End stage renal disease: Secondary | ICD-10-CM | POA: Diagnosis not present

## 2015-02-07 DIAGNOSIS — D631 Anemia in chronic kidney disease: Secondary | ICD-10-CM | POA: Diagnosis not present

## 2015-02-07 DIAGNOSIS — N2581 Secondary hyperparathyroidism of renal origin: Secondary | ICD-10-CM | POA: Diagnosis not present

## 2015-02-07 DIAGNOSIS — D508 Other iron deficiency anemias: Secondary | ICD-10-CM | POA: Diagnosis not present

## 2015-02-10 DIAGNOSIS — M9905 Segmental and somatic dysfunction of pelvic region: Secondary | ICD-10-CM | POA: Diagnosis not present

## 2015-02-10 DIAGNOSIS — N2581 Secondary hyperparathyroidism of renal origin: Secondary | ICD-10-CM | POA: Diagnosis not present

## 2015-02-10 DIAGNOSIS — N186 End stage renal disease: Secondary | ICD-10-CM | POA: Diagnosis not present

## 2015-02-10 DIAGNOSIS — M9903 Segmental and somatic dysfunction of lumbar region: Secondary | ICD-10-CM | POA: Diagnosis not present

## 2015-02-10 DIAGNOSIS — M955 Acquired deformity of pelvis: Secondary | ICD-10-CM | POA: Diagnosis not present

## 2015-02-10 DIAGNOSIS — D508 Other iron deficiency anemias: Secondary | ICD-10-CM | POA: Diagnosis not present

## 2015-02-10 DIAGNOSIS — D631 Anemia in chronic kidney disease: Secondary | ICD-10-CM | POA: Diagnosis not present

## 2015-02-10 DIAGNOSIS — M5136 Other intervertebral disc degeneration, lumbar region: Secondary | ICD-10-CM | POA: Diagnosis not present

## 2015-02-12 DIAGNOSIS — M9905 Segmental and somatic dysfunction of pelvic region: Secondary | ICD-10-CM | POA: Diagnosis not present

## 2015-02-12 DIAGNOSIS — N2581 Secondary hyperparathyroidism of renal origin: Secondary | ICD-10-CM | POA: Diagnosis not present

## 2015-02-12 DIAGNOSIS — M5136 Other intervertebral disc degeneration, lumbar region: Secondary | ICD-10-CM | POA: Diagnosis not present

## 2015-02-12 DIAGNOSIS — M955 Acquired deformity of pelvis: Secondary | ICD-10-CM | POA: Diagnosis not present

## 2015-02-12 DIAGNOSIS — M9903 Segmental and somatic dysfunction of lumbar region: Secondary | ICD-10-CM | POA: Diagnosis not present

## 2015-02-12 DIAGNOSIS — D631 Anemia in chronic kidney disease: Secondary | ICD-10-CM | POA: Diagnosis not present

## 2015-02-12 DIAGNOSIS — N186 End stage renal disease: Secondary | ICD-10-CM | POA: Diagnosis not present

## 2015-02-12 DIAGNOSIS — D508 Other iron deficiency anemias: Secondary | ICD-10-CM | POA: Diagnosis not present

## 2015-02-13 DIAGNOSIS — M9905 Segmental and somatic dysfunction of pelvic region: Secondary | ICD-10-CM | POA: Diagnosis not present

## 2015-02-13 DIAGNOSIS — M955 Acquired deformity of pelvis: Secondary | ICD-10-CM | POA: Diagnosis not present

## 2015-02-13 DIAGNOSIS — M5136 Other intervertebral disc degeneration, lumbar region: Secondary | ICD-10-CM | POA: Diagnosis not present

## 2015-02-13 DIAGNOSIS — M9903 Segmental and somatic dysfunction of lumbar region: Secondary | ICD-10-CM | POA: Diagnosis not present

## 2015-02-14 DIAGNOSIS — N186 End stage renal disease: Secondary | ICD-10-CM | POA: Diagnosis not present

## 2015-02-14 DIAGNOSIS — D508 Other iron deficiency anemias: Secondary | ICD-10-CM | POA: Diagnosis not present

## 2015-02-14 DIAGNOSIS — D631 Anemia in chronic kidney disease: Secondary | ICD-10-CM | POA: Diagnosis not present

## 2015-02-14 DIAGNOSIS — N2581 Secondary hyperparathyroidism of renal origin: Secondary | ICD-10-CM | POA: Diagnosis not present

## 2015-02-17 DIAGNOSIS — M955 Acquired deformity of pelvis: Secondary | ICD-10-CM | POA: Diagnosis not present

## 2015-02-17 DIAGNOSIS — D631 Anemia in chronic kidney disease: Secondary | ICD-10-CM | POA: Diagnosis not present

## 2015-02-17 DIAGNOSIS — N186 End stage renal disease: Secondary | ICD-10-CM | POA: Diagnosis not present

## 2015-02-17 DIAGNOSIS — M5136 Other intervertebral disc degeneration, lumbar region: Secondary | ICD-10-CM | POA: Diagnosis not present

## 2015-02-17 DIAGNOSIS — D508 Other iron deficiency anemias: Secondary | ICD-10-CM | POA: Diagnosis not present

## 2015-02-17 DIAGNOSIS — M9905 Segmental and somatic dysfunction of pelvic region: Secondary | ICD-10-CM | POA: Diagnosis not present

## 2015-02-17 DIAGNOSIS — N2581 Secondary hyperparathyroidism of renal origin: Secondary | ICD-10-CM | POA: Diagnosis not present

## 2015-02-17 DIAGNOSIS — M9903 Segmental and somatic dysfunction of lumbar region: Secondary | ICD-10-CM | POA: Diagnosis not present

## 2015-02-18 DIAGNOSIS — M9905 Segmental and somatic dysfunction of pelvic region: Secondary | ICD-10-CM | POA: Diagnosis not present

## 2015-02-18 DIAGNOSIS — M5136 Other intervertebral disc degeneration, lumbar region: Secondary | ICD-10-CM | POA: Diagnosis not present

## 2015-02-18 DIAGNOSIS — M9903 Segmental and somatic dysfunction of lumbar region: Secondary | ICD-10-CM | POA: Diagnosis not present

## 2015-02-18 DIAGNOSIS — M955 Acquired deformity of pelvis: Secondary | ICD-10-CM | POA: Diagnosis not present

## 2015-02-19 DIAGNOSIS — M5136 Other intervertebral disc degeneration, lumbar region: Secondary | ICD-10-CM | POA: Diagnosis not present

## 2015-02-19 DIAGNOSIS — M9903 Segmental and somatic dysfunction of lumbar region: Secondary | ICD-10-CM | POA: Diagnosis not present

## 2015-02-19 DIAGNOSIS — N2581 Secondary hyperparathyroidism of renal origin: Secondary | ICD-10-CM | POA: Diagnosis not present

## 2015-02-19 DIAGNOSIS — N186 End stage renal disease: Secondary | ICD-10-CM | POA: Diagnosis not present

## 2015-02-19 DIAGNOSIS — M955 Acquired deformity of pelvis: Secondary | ICD-10-CM | POA: Diagnosis not present

## 2015-02-19 DIAGNOSIS — M9905 Segmental and somatic dysfunction of pelvic region: Secondary | ICD-10-CM | POA: Diagnosis not present

## 2015-02-19 DIAGNOSIS — D508 Other iron deficiency anemias: Secondary | ICD-10-CM | POA: Diagnosis not present

## 2015-02-19 DIAGNOSIS — D631 Anemia in chronic kidney disease: Secondary | ICD-10-CM | POA: Diagnosis not present

## 2015-02-21 DIAGNOSIS — N186 End stage renal disease: Secondary | ICD-10-CM | POA: Diagnosis not present

## 2015-02-21 DIAGNOSIS — N2581 Secondary hyperparathyroidism of renal origin: Secondary | ICD-10-CM | POA: Diagnosis not present

## 2015-02-21 DIAGNOSIS — D508 Other iron deficiency anemias: Secondary | ICD-10-CM | POA: Diagnosis not present

## 2015-02-21 DIAGNOSIS — D631 Anemia in chronic kidney disease: Secondary | ICD-10-CM | POA: Diagnosis not present

## 2015-02-24 DIAGNOSIS — M9903 Segmental and somatic dysfunction of lumbar region: Secondary | ICD-10-CM | POA: Diagnosis not present

## 2015-02-24 DIAGNOSIS — M5136 Other intervertebral disc degeneration, lumbar region: Secondary | ICD-10-CM | POA: Diagnosis not present

## 2015-02-24 DIAGNOSIS — D631 Anemia in chronic kidney disease: Secondary | ICD-10-CM | POA: Diagnosis not present

## 2015-02-24 DIAGNOSIS — N186 End stage renal disease: Secondary | ICD-10-CM | POA: Diagnosis not present

## 2015-02-24 DIAGNOSIS — M955 Acquired deformity of pelvis: Secondary | ICD-10-CM | POA: Diagnosis not present

## 2015-02-24 DIAGNOSIS — M9905 Segmental and somatic dysfunction of pelvic region: Secondary | ICD-10-CM | POA: Diagnosis not present

## 2015-02-24 DIAGNOSIS — N2581 Secondary hyperparathyroidism of renal origin: Secondary | ICD-10-CM | POA: Diagnosis not present

## 2015-02-24 DIAGNOSIS — D508 Other iron deficiency anemias: Secondary | ICD-10-CM | POA: Diagnosis not present

## 2015-02-25 ENCOUNTER — Ambulatory Visit: Payer: Medicare Other | Admitting: Cardiology

## 2015-02-26 DIAGNOSIS — D631 Anemia in chronic kidney disease: Secondary | ICD-10-CM | POA: Diagnosis not present

## 2015-02-26 DIAGNOSIS — M9905 Segmental and somatic dysfunction of pelvic region: Secondary | ICD-10-CM | POA: Diagnosis not present

## 2015-02-26 DIAGNOSIS — N2581 Secondary hyperparathyroidism of renal origin: Secondary | ICD-10-CM | POA: Diagnosis not present

## 2015-02-26 DIAGNOSIS — M5136 Other intervertebral disc degeneration, lumbar region: Secondary | ICD-10-CM | POA: Diagnosis not present

## 2015-02-26 DIAGNOSIS — Z992 Dependence on renal dialysis: Secondary | ICD-10-CM | POA: Diagnosis not present

## 2015-02-26 DIAGNOSIS — M9903 Segmental and somatic dysfunction of lumbar region: Secondary | ICD-10-CM | POA: Diagnosis not present

## 2015-02-26 DIAGNOSIS — N186 End stage renal disease: Secondary | ICD-10-CM | POA: Diagnosis not present

## 2015-02-26 DIAGNOSIS — M955 Acquired deformity of pelvis: Secondary | ICD-10-CM | POA: Diagnosis not present

## 2015-02-26 DIAGNOSIS — E1129 Type 2 diabetes mellitus with other diabetic kidney complication: Secondary | ICD-10-CM | POA: Diagnosis not present

## 2015-02-26 DIAGNOSIS — D508 Other iron deficiency anemias: Secondary | ICD-10-CM | POA: Diagnosis not present

## 2015-02-26 NOTE — Progress Notes (Signed)
HPI: FU bradycardia. She is dialysis dependent. Holter monitor in April of 2014 showed sinus rhythm to sinus bradycardia with PACs and PVCs. Patient admitted in June 2016 with GI bleed. She had dyspnea with ST depression noted on electrocardiogram. Troponin peaked at 1.34. Treated medically. Echocardiogram June 2016 showed ejection fraction of 50% with inferior lateral hypokinesis. There was mild mitral regurgitation, biatrial enlargement and mild right ventricular enlargement. Nuclear study repeated September 2016. Ejection fraction 47%. There was an anterior lateral perfusion defect consistent with prior infarct and peri-infarct ischemia. EGD showed vascular ectasia. Since last seen, She does have dyspnea on exertion. This has been long-standing but worse recently. It does worsen with anemia. She has not had chest pain. No orthopnea, PND or pedal edema. No syncope.  Current Outpatient Prescriptions  Medication Sig Dispense Refill  . acetaminophen (TYLENOL) 500 MG tablet Take 1,000 mg by mouth every 6 (six) hours as needed for moderate pain.    Marland Kitchen amLODipine (NORVASC) 5 MG tablet TAKE 1 TABLET (5 MG TOTAL) BY MOUTH AT BEDTIME DAILY (Patient taking differently: Take 2.5 mg by mouth daily. TAKE 1 TABLET (5 MG TOTAL) BY MOUTH AT BEDTIME DAILY) 30 tablet 11  . Artificial Tear Ointment (REFRESH P.M. OP) Place 1 application into the right eye daily.    . B Complex-C-Folic Acid (RENA-VITE PO) Take 1 tablet by mouth daily with breakfast.     . Calcium Carbonate Antacid (TUMS ULTRA 1000 PO) Take 2,000 mg by mouth 3 (three) times daily with meals.     . cholecalciferol (VITAMIN D) 1000 UNITS tablet Take 1,000 Units by mouth every Monday, Wednesday, and Friday.     . pantoprazole (PROTONIX) 40 MG tablet Take 1 tablet (40 mg total) by mouth 2 (two) times daily. 30 tablet 5  . PRESCRIPTION MEDICATION [facility] Anemia Therapy    . simvastatin (ZOCOR) 20 MG tablet TAKE  ONE TABLET BY MOUTH NIGHTLY AT BEDTIME 30  tablet 3   No current facility-administered medications for this visit.     Past Medical History  Diagnosis Date  . Hyperlipidemia   . Hypertension   . Anxiety   . Facial droop 1935    acquired during forceps delivery, some residual R visual loss  . Hx of breast cancer 2004  . Arthritis     mild in lower back (Drs Posey Pronto and Joelyn Oms)  . GERD (gastroesophageal reflux disease)   . Bradycardia     a. Noted 06/2012.  Marland Kitchen Hypotension     a. Associated w/ dialysis.  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 [name])  . Osteoporosis 11/2013    DEXA -3.4 radius  . GAVE (gastric antral vascular ectasia) 08/2014    s/p EGD with APC  . Anemia in chronic kidney disease     IV iron infusion  . End stage renal disease on dialysis [facility])     HD M,W,F [place] AVF now LUE AVF  . Cancer [facility])     Left Breast 12 yrs. ago  . Hearing aid worn     Bilateral  . Sight impaired     Wears contact in Left Eye  . Cardiac murmur     a. thought due to AVF (2D echo 06/2012 without significant valvular disease).     Past Surgical History  Procedure Laterality Date  . Cataract extraction  1981  . Av fistula placement  07/20/10    Right brachiocephalic AVF  .  Umbilical hernia repair  2011  . Dexa  2013    [facility]  . US echocardiography  06/2012    LVH, EF 65%, grade 1 diastol dysfunction, mod dliated LAD  . Eye surgery  1966    regular cataract  . Breast lumpectomy Left   . Breast biopsy Left   . Bascilic vein transposition Left 07/24/2013    Procedure: BASILIC VEIN TRANSPOSITION;  Surgeon: [name], MD;  Location: [place];  Service: Vascular;  Laterality: Left;  . Shuntogram N/A 04/16/2011    Procedure: Earney Mallet;  Surgeon: [name], MD;  Location: [facility] CATH LAB;  Service: Cardiovascular;  Laterality: N/A;  . Shuntogram Right 07/05/2013    Procedure: FISTULOGRAM;  Surgeon: [name], MD;  Location: [facility] CATH LAB;  Service: Cardiovascular;   Laterality: Right;  . Esophagogastroduodenoscopy N/A 04/09/2014    Procedure: ESOPHAGOGASTRODUODENOSCOPY (EGD);  Surgeon: [name], MD;  Location: [place];  Service: Endoscopy;  Laterality: N/A;  . Peripheral vascular catheterization N/A 08/15/2014    Procedure: A/V Shuntogram/Fistulagram;  Surgeon: [name], MD;  Location: [place] CV LAB;  Service: Cardiovascular;  Laterality: N/A;  . Esophagogastroduodenoscopy N/A 09/23/2014    Procedure: ESOPHAGOGASTRODUODENOSCOPY (EGD);  Surgeon: [name], MD;  Location: [place];  Service: Endoscopy;  Laterality: N/A;  . Hernia repair    . Esophagogastroduodenoscopy (egd) with propofol N/A 11/12/2014    Procedure: ESOPHAGOGASTRODUODENOSCOPY (EGD) WITH PROPOFOL;  Surgeon: [name], MD;  [facility];  Service: Endoscopy;  Laterality: N/A;  . Hot hemostasis N/A 11/12/2014    Procedure: HOT HEMOSTASIS (ARGON PLASMA COAGULATION/BICAP);  Surgeon: [name], MD;  [facility];  Service: Endoscopy;  Laterality: N/A;  . Esophagogastroduodenoscopy N/A 12/26/2014    neg H pylori, benign biopsies; ESOPHAGOGASTRODUODENOSCOPY (EGD);  Surgeon: [name], MD    Social History   Social History  . Marital Status: Single    Spouse Name: N/A  . Number of Children: N/A  . Years of Education: N/A   Occupational History  . 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 Smoking status: Former Smoker -- 2 years    Types: Cigarettes    Quit date: 03/29/1953  . Smokeless tobacco: Never Used  . Alcohol Use: 0.0 oz/week    0 Standard drinks or equivalent per week     Comment: occasional  . Drug Use: No  . Sexual Activity: Not on file   Other Topics Concern  . Not on file   Social History Narrative   As of 03/2013. Lives alone with her small dog.  Bother and sister in law died last year.     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 Remaining systems are negative.  Physical Exam: Well-developed well-nourished in no acute distress.  Skin is warm and dry.  HEENT Right facial droop. Neck is supple.  Chest is clear to auscultation with normal expansion.  Cardiovascular exam is regular rate and rhythm.  Abdominal exam nontender or distended. No masses palpated. Extremities show no edema. neuro grossly intact  ECG Sinus rhythm at a rate of 87. Occasional PVC. Cannot rule out prior septal infarct.

## 2015-02-28 DIAGNOSIS — M9905 Segmental and somatic dysfunction of pelvic region: Secondary | ICD-10-CM | POA: Diagnosis not present

## 2015-02-28 DIAGNOSIS — M5136 Other intervertebral disc degeneration, lumbar region: Secondary | ICD-10-CM | POA: Diagnosis not present

## 2015-02-28 DIAGNOSIS — M955 Acquired deformity of pelvis: Secondary | ICD-10-CM | POA: Diagnosis not present

## 2015-02-28 DIAGNOSIS — N186 End stage renal disease: Secondary | ICD-10-CM | POA: Diagnosis not present

## 2015-02-28 DIAGNOSIS — D631 Anemia in chronic kidney disease: Secondary | ICD-10-CM | POA: Diagnosis not present

## 2015-02-28 DIAGNOSIS — D508 Other iron deficiency anemias: Secondary | ICD-10-CM | POA: Diagnosis not present

## 2015-02-28 DIAGNOSIS — M9903 Segmental and somatic dysfunction of lumbar region: Secondary | ICD-10-CM | POA: Diagnosis not present

## 2015-02-28 DIAGNOSIS — N2581 Secondary hyperparathyroidism of renal origin: Secondary | ICD-10-CM | POA: Diagnosis not present

## 2015-03-03 DIAGNOSIS — D508 Other iron deficiency anemias: Secondary | ICD-10-CM | POA: Diagnosis not present

## 2015-03-03 DIAGNOSIS — D631 Anemia in chronic kidney disease: Secondary | ICD-10-CM | POA: Diagnosis not present

## 2015-03-03 DIAGNOSIS — N186 End stage renal disease: Secondary | ICD-10-CM | POA: Diagnosis not present

## 2015-03-03 DIAGNOSIS — N2581 Secondary hyperparathyroidism of renal origin: Secondary | ICD-10-CM | POA: Diagnosis not present

## 2015-03-04 DIAGNOSIS — M5136 Other intervertebral disc degeneration, lumbar region: Secondary | ICD-10-CM | POA: Diagnosis not present

## 2015-03-04 DIAGNOSIS — M9905 Segmental and somatic dysfunction of pelvic region: Secondary | ICD-10-CM | POA: Diagnosis not present

## 2015-03-04 DIAGNOSIS — M955 Acquired deformity of pelvis: Secondary | ICD-10-CM | POA: Diagnosis not present

## 2015-03-04 DIAGNOSIS — M9903 Segmental and somatic dysfunction of lumbar region: Secondary | ICD-10-CM | POA: Diagnosis not present

## 2015-03-05 DIAGNOSIS — N2581 Secondary hyperparathyroidism of renal origin: Secondary | ICD-10-CM | POA: Diagnosis not present

## 2015-03-05 DIAGNOSIS — D631 Anemia in chronic kidney disease: Secondary | ICD-10-CM | POA: Diagnosis not present

## 2015-03-05 DIAGNOSIS — D508 Other iron deficiency anemias: Secondary | ICD-10-CM | POA: Diagnosis not present

## 2015-03-05 DIAGNOSIS — N186 End stage renal disease: Secondary | ICD-10-CM | POA: Diagnosis not present

## 2015-03-06 ENCOUNTER — Ambulatory Visit (INDEPENDENT_AMBULATORY_CARE_PROVIDER_SITE_OTHER): Payer: Medicare Other | Admitting: Cardiology

## 2015-03-06 ENCOUNTER — Encounter: Payer: Self-pay | Admitting: Cardiology

## 2015-03-06 VITALS — BP 118/64 | HR 87 | Ht 59.0 in | Wt 114.0 lb

## 2015-03-06 DIAGNOSIS — K31819 Angiodysplasia of stomach and duodenum without bleeding: Secondary | ICD-10-CM | POA: Diagnosis not present

## 2015-03-06 DIAGNOSIS — M5136 Other intervertebral disc degeneration, lumbar region: Secondary | ICD-10-CM | POA: Diagnosis not present

## 2015-03-06 DIAGNOSIS — R9430 Abnormal result of cardiovascular function study, unspecified: Secondary | ICD-10-CM | POA: Insufficient documentation

## 2015-03-06 DIAGNOSIS — M9903 Segmental and somatic dysfunction of lumbar region: Secondary | ICD-10-CM | POA: Diagnosis not present

## 2015-03-06 DIAGNOSIS — E785 Hyperlipidemia, unspecified: Secondary | ICD-10-CM | POA: Diagnosis not present

## 2015-03-06 DIAGNOSIS — M9905 Segmental and somatic dysfunction of pelvic region: Secondary | ICD-10-CM | POA: Diagnosis not present

## 2015-03-06 DIAGNOSIS — I10 Essential (primary) hypertension: Secondary | ICD-10-CM

## 2015-03-06 DIAGNOSIS — M955 Acquired deformity of pelvis: Secondary | ICD-10-CM | POA: Diagnosis not present

## 2015-03-06 DIAGNOSIS — I248 Other forms of acute ischemic heart disease: Secondary | ICD-10-CM | POA: Diagnosis not present

## 2015-03-06 MED ORDER — ASPIRIN EC 81 MG PO TBEC: 81.0000 mg | DELAYED_RELEASE_TABLET | Freq: Every day | ORAL | Status: AC

## 2015-03-06 NOTE — Assessment & Plan Note (Signed)
Patient had recent cauterization. Management per gastroenterology.

## 2015-03-06 NOTE — Assessment & Plan Note (Signed)
Blood pressure controlled. Continue present medications. 

## 2015-03-06 NOTE — Assessment & Plan Note (Signed)
Continue statin. 

## 2015-03-06 NOTE — Patient Instructions (Signed)
Medication Instructions:   START ASPIRIN 81 MG ONCE DAILY  Follow-Up:  Your physician recommends that you schedule a follow-up appointment in: [place]  If you need a refill on your cardiac medications before your next appointment, please call your pharmacy.

## 2015-03-06 NOTE — Assessment & Plan Note (Signed)
Patient is noted to have ischemia on her nuclear study. Previous elevation of troponin in the setting of anemia/GI bleeding. She has vascular malformations in her stomach and they have been recently cauterized. She has dyspnea which worsens when she is more anemic. Ideally I would like to proceed with cardiac catheterization. However if patient requires PCI it would be problematic because of history of GI bleeding and chronic anemia. I will add aspirin 81 mg daily to see if she tolerates. If so we could proceed with cardiac catheterization with PCI if needed. A bare metal stent could be used with 30 days of Plavix and then discontinued. She may require transfusions to keep hemoglobin above 10 but I will leave this to nephrology. They are following hemoglobins on her.

## 2015-03-07 DIAGNOSIS — D631 Anemia in chronic kidney disease: Secondary | ICD-10-CM | POA: Diagnosis not present

## 2015-03-07 DIAGNOSIS — D508 Other iron deficiency anemias: Secondary | ICD-10-CM | POA: Diagnosis not present

## 2015-03-07 DIAGNOSIS — N186 End stage renal disease: Secondary | ICD-10-CM | POA: Diagnosis not present

## 2015-03-07 DIAGNOSIS — N2581 Secondary hyperparathyroidism of renal origin: Secondary | ICD-10-CM | POA: Diagnosis not present

## 2015-03-10 DIAGNOSIS — N2581 Secondary hyperparathyroidism of renal origin: Secondary | ICD-10-CM | POA: Diagnosis not present

## 2015-03-10 DIAGNOSIS — N186 End stage renal disease: Secondary | ICD-10-CM | POA: Diagnosis not present

## 2015-03-10 DIAGNOSIS — D508 Other iron deficiency anemias: Secondary | ICD-10-CM | POA: Diagnosis not present

## 2015-03-10 DIAGNOSIS — D631 Anemia in chronic kidney disease: Secondary | ICD-10-CM | POA: Diagnosis not present

## 2015-03-12 ENCOUNTER — Other Ambulatory Visit: Payer: Self-pay | Admitting: *Deleted

## 2015-03-12 DIAGNOSIS — M9905 Segmental and somatic dysfunction of pelvic region: Secondary | ICD-10-CM | POA: Diagnosis not present

## 2015-03-12 DIAGNOSIS — M5136 Other intervertebral disc degeneration, lumbar region: Secondary | ICD-10-CM | POA: Diagnosis not present

## 2015-03-12 DIAGNOSIS — N2581 Secondary hyperparathyroidism of renal origin: Secondary | ICD-10-CM | POA: Diagnosis not present

## 2015-03-12 DIAGNOSIS — N186 End stage renal disease: Secondary | ICD-10-CM | POA: Diagnosis not present

## 2015-03-12 DIAGNOSIS — D631 Anemia in chronic kidney disease: Secondary | ICD-10-CM | POA: Diagnosis not present

## 2015-03-12 DIAGNOSIS — M9903 Segmental and somatic dysfunction of lumbar region: Secondary | ICD-10-CM | POA: Diagnosis not present

## 2015-03-12 DIAGNOSIS — D508 Other iron deficiency anemias: Secondary | ICD-10-CM | POA: Diagnosis not present

## 2015-03-12 DIAGNOSIS — M955 Acquired deformity of pelvis: Secondary | ICD-10-CM | POA: Diagnosis not present

## 2015-03-12 MED ORDER — SIMVASTATIN 20 MG PO TABS: 20.0000 mg | ORAL_TABLET | Freq: Every day | ORAL | Status: DC

## 2015-03-14 DIAGNOSIS — N2581 Secondary hyperparathyroidism of renal origin: Secondary | ICD-10-CM | POA: Diagnosis not present

## 2015-03-14 DIAGNOSIS — D631 Anemia in chronic kidney disease: Secondary | ICD-10-CM | POA: Diagnosis not present

## 2015-03-14 DIAGNOSIS — D508 Other iron deficiency anemias: Secondary | ICD-10-CM | POA: Diagnosis not present

## 2015-03-14 DIAGNOSIS — E1129 Type 2 diabetes mellitus with other diabetic kidney complication: Secondary | ICD-10-CM | POA: Diagnosis not present

## 2015-03-14 DIAGNOSIS — N186 End stage renal disease: Secondary | ICD-10-CM | POA: Diagnosis not present

## 2015-03-17 ENCOUNTER — Telehealth: Payer: Self-pay | Admitting: Cardiology

## 2015-03-17 DIAGNOSIS — D508 Other iron deficiency anemias: Secondary | ICD-10-CM | POA: Diagnosis not present

## 2015-03-17 DIAGNOSIS — N186 End stage renal disease: Secondary | ICD-10-CM | POA: Diagnosis not present

## 2015-03-17 DIAGNOSIS — D631 Anemia in chronic kidney disease: Secondary | ICD-10-CM | POA: Diagnosis not present

## 2015-03-17 DIAGNOSIS — N2581 Secondary hyperparathyroidism of renal origin: Secondary | ICD-10-CM | POA: Diagnosis not present

## 2015-03-17 NOTE — Telephone Encounter (Signed)
Pt says she is having roblem breathing. She is suppose to leave for [place] tomorrow,wants to make sure it is all right for her to go. She says her breathing is worse.

## 2015-03-17 NOTE — Telephone Encounter (Signed)
Would arrange paov prior to travel [name]

## 2015-03-17 NOTE — Telephone Encounter (Signed)
Spoke with pt, when she saw dr Stanford Breed 2 weeks ago she was having SOB with exertion, now she is having SOB with rest. She denies orthopnea, sleeps on 2 pillows as a routine. Denies edema or chest pain and there has been no change in her weight. She does get a pain under her rib during dialysis. She is leaving tomorrow to go to [place] and wants to know if she will be okay to fly. Will forward for dr Stanford Breed review

## 2015-03-18 DIAGNOSIS — M9903 Segmental and somatic dysfunction of lumbar region: Secondary | ICD-10-CM | POA: Diagnosis not present

## 2015-03-18 DIAGNOSIS — M955 Acquired deformity of pelvis: Secondary | ICD-10-CM | POA: Diagnosis not present

## 2015-03-18 DIAGNOSIS — M9905 Segmental and somatic dysfunction of pelvic region: Secondary | ICD-10-CM | POA: Diagnosis not present

## 2015-03-18 DIAGNOSIS — M5136 Other intervertebral disc degeneration, lumbar region: Secondary | ICD-10-CM | POA: Diagnosis not present

## 2015-03-18 NOTE — Telephone Encounter (Signed)
Spoke with pt, she has canceled her trip. She seems to be better today. She wants to wait to see dr Stanford Breed on 04-03-15. Patient voiced understanding to call prior to appt if changes.

## 2015-03-19 DIAGNOSIS — D631 Anemia in chronic kidney disease: Secondary | ICD-10-CM | POA: Diagnosis not present

## 2015-03-19 DIAGNOSIS — N2581 Secondary hyperparathyroidism of renal origin: Secondary | ICD-10-CM | POA: Diagnosis not present

## 2015-03-19 DIAGNOSIS — N186 End stage renal disease: Secondary | ICD-10-CM | POA: Diagnosis not present

## 2015-03-19 DIAGNOSIS — D508 Other iron deficiency anemias: Secondary | ICD-10-CM | POA: Diagnosis not present

## 2015-03-20 DIAGNOSIS — M955 Acquired deformity of pelvis: Secondary | ICD-10-CM | POA: Diagnosis not present

## 2015-03-20 DIAGNOSIS — M9905 Segmental and somatic dysfunction of pelvic region: Secondary | ICD-10-CM | POA: Diagnosis not present

## 2015-03-20 DIAGNOSIS — M5136 Other intervertebral disc degeneration, lumbar region: Secondary | ICD-10-CM | POA: Diagnosis not present

## 2015-03-20 DIAGNOSIS — M9903 Segmental and somatic dysfunction of lumbar region: Secondary | ICD-10-CM | POA: Diagnosis not present

## 2015-03-21 DIAGNOSIS — D508 Other iron deficiency anemias: Secondary | ICD-10-CM | POA: Diagnosis not present

## 2015-03-21 DIAGNOSIS — N186 End stage renal disease: Secondary | ICD-10-CM | POA: Diagnosis not present

## 2015-03-21 DIAGNOSIS — N2581 Secondary hyperparathyroidism of renal origin: Secondary | ICD-10-CM | POA: Diagnosis not present

## 2015-03-21 DIAGNOSIS — D631 Anemia in chronic kidney disease: Secondary | ICD-10-CM | POA: Diagnosis not present

## 2015-03-24 DIAGNOSIS — N2581 Secondary hyperparathyroidism of renal origin: Secondary | ICD-10-CM | POA: Diagnosis not present

## 2015-03-24 DIAGNOSIS — N186 End stage renal disease: Secondary | ICD-10-CM | POA: Diagnosis not present

## 2015-03-24 DIAGNOSIS — D631 Anemia in chronic kidney disease: Secondary | ICD-10-CM | POA: Diagnosis not present

## 2015-03-24 DIAGNOSIS — D508 Other iron deficiency anemias: Secondary | ICD-10-CM | POA: Diagnosis not present

## 2015-03-25 ENCOUNTER — Other Ambulatory Visit: Payer: Self-pay | Admitting: *Deleted

## 2015-03-25 MED ORDER — SIMVASTATIN 20 MG PO TABS: 20.0000 mg | ORAL_TABLET | Freq: Every day | ORAL | Status: DC

## 2015-03-26 DIAGNOSIS — M955 Acquired deformity of pelvis: Secondary | ICD-10-CM | POA: Diagnosis not present

## 2015-03-26 DIAGNOSIS — D631 Anemia in chronic kidney disease: Secondary | ICD-10-CM | POA: Diagnosis not present

## 2015-03-26 DIAGNOSIS — M9905 Segmental and somatic dysfunction of pelvic region: Secondary | ICD-10-CM | POA: Diagnosis not present

## 2015-03-26 DIAGNOSIS — M5136 Other intervertebral disc degeneration, lumbar region: Secondary | ICD-10-CM | POA: Diagnosis not present

## 2015-03-26 DIAGNOSIS — N2581 Secondary hyperparathyroidism of renal origin: Secondary | ICD-10-CM | POA: Diagnosis not present

## 2015-03-26 DIAGNOSIS — M9903 Segmental and somatic dysfunction of lumbar region: Secondary | ICD-10-CM | POA: Diagnosis not present

## 2015-03-26 DIAGNOSIS — D508 Other iron deficiency anemias: Secondary | ICD-10-CM | POA: Diagnosis not present

## 2015-03-26 DIAGNOSIS — N186 End stage renal disease: Secondary | ICD-10-CM | POA: Diagnosis not present

## 2015-03-27 ENCOUNTER — Encounter: Payer: Self-pay | Admitting: *Deleted

## 2015-03-28 DIAGNOSIS — D508 Other iron deficiency anemias: Secondary | ICD-10-CM | POA: Diagnosis not present

## 2015-03-28 DIAGNOSIS — N186 End stage renal disease: Secondary | ICD-10-CM | POA: Diagnosis not present

## 2015-03-28 DIAGNOSIS — N2581 Secondary hyperparathyroidism of renal origin: Secondary | ICD-10-CM | POA: Diagnosis not present

## 2015-03-28 DIAGNOSIS — D631 Anemia in chronic kidney disease: Secondary | ICD-10-CM | POA: Diagnosis not present

## 2015-03-29 DIAGNOSIS — N186 End stage renal disease: Secondary | ICD-10-CM | POA: Diagnosis not present

## 2015-03-29 DIAGNOSIS — E1129 Type 2 diabetes mellitus with other diabetic kidney complication: Secondary | ICD-10-CM | POA: Diagnosis not present

## 2015-03-29 DIAGNOSIS — Z992 Dependence on renal dialysis: Secondary | ICD-10-CM | POA: Diagnosis not present

## 2015-03-31 DIAGNOSIS — D509 Iron deficiency anemia, unspecified: Secondary | ICD-10-CM | POA: Diagnosis not present

## 2015-03-31 DIAGNOSIS — D631 Anemia in chronic kidney disease: Secondary | ICD-10-CM | POA: Diagnosis not present

## 2015-03-31 DIAGNOSIS — Z23 Encounter for immunization: Secondary | ICD-10-CM | POA: Diagnosis not present

## 2015-03-31 DIAGNOSIS — N186 End stage renal disease: Secondary | ICD-10-CM | POA: Diagnosis not present

## 2015-03-31 DIAGNOSIS — D508 Other iron deficiency anemias: Secondary | ICD-10-CM | POA: Diagnosis not present

## 2015-03-31 DIAGNOSIS — N2581 Secondary hyperparathyroidism of renal origin: Secondary | ICD-10-CM | POA: Diagnosis not present

## 2015-04-01 DIAGNOSIS — I10 Essential (primary) hypertension: Secondary | ICD-10-CM | POA: Diagnosis not present

## 2015-04-01 DIAGNOSIS — N186 End stage renal disease: Secondary | ICD-10-CM | POA: Diagnosis not present

## 2015-04-01 DIAGNOSIS — Z992 Dependence on renal dialysis: Secondary | ICD-10-CM | POA: Diagnosis not present

## 2015-04-01 DIAGNOSIS — E785 Hyperlipidemia, unspecified: Secondary | ICD-10-CM | POA: Diagnosis not present

## 2015-04-01 DIAGNOSIS — T82818A Embolism of vascular prosthetic devices, implants and grafts, initial encounter: Secondary | ICD-10-CM | POA: Diagnosis not present

## 2015-04-01 DIAGNOSIS — T82318A Breakdown (mechanical) of other vascular grafts, initial encounter: Secondary | ICD-10-CM | POA: Diagnosis not present

## 2015-04-01 DIAGNOSIS — Y841 Kidney dialysis as the cause of abnormal reaction of the patient, or of later complication, without mention of misadventure at the time of the procedure: Secondary | ICD-10-CM | POA: Diagnosis not present

## 2015-04-01 NOTE — Progress Notes (Signed)
HPI: FU bradycardia and CAD. She is dialysis dependent. Holter monitor in April of 2014 showed sinus rhythm to sinus bradycardia with PACs and PVCs. Patient admitted in June 2016 with GI bleed. She had dyspnea with ST depression noted on electrocardiogram. Troponin peaked at 1.34. Treated medically. Echocardiogram June 2016 showed ejection fraction of 50% with inferior lateral hypokinesis. There was mild mitral regurgitation, biatrial enlargement and mild right ventricular enlargement. Nuclear study repeated September 2016. Ejection fraction 47%. There was an anterior lateral perfusion defect consistent with prior infarct and peri-infarct ischemia. EGD showed vascular ectasia. At last ov, catheterization was considered but pt having issues with intermittent anemia; ASA added to see if she could tolerate antiplt therapy. Since last seen, She has some dyspnea on exertion but no orthopnea, PND or pedal edema. No chest pain.  Current Outpatient Prescriptions  Medication Sig Dispense Refill  . acetaminophen (TYLENOL) 500 MG tablet Take 1,000 mg by mouth every 6 (six) hours as needed for moderate pain.    Marland Kitchen amLODipine (NORVASC) 2.5 MG tablet Take 2.5 mg by mouth at bedtime.    . Artificial Tear Ointment (REFRESH P.M. OP) Place 1 application into the right eye daily.    Marland Kitchen aspirin EC 81 MG tablet Take 1 tablet (81 mg total) by mouth daily. 90 tablet 3  . B Complex-C-Folic Acid (RENA-VITE PO) Take 1 tablet by mouth daily with breakfast.     . Calcium Carbonate Antacid (TUMS ULTRA 1000 PO) Take 2,000 mg by mouth 3 (three) times daily with meals.     . cholecalciferol (VITAMIN D) 1000 UNITS tablet Take 1,000 Units by mouth every Monday, Wednesday, and Friday.     . pantoprazole (PROTONIX) 40 MG tablet Take 1 tablet (40 mg total) by mouth 2 (two) times daily. 30 tablet 5  . PRESCRIPTION MEDICATION [facility] Anemia Therapy    . simvastatin (ZOCOR) 20 MG tablet Take 1 tablet (20 mg total) by mouth at bedtime. 30  tablet 6   No current facility-administered medications for this visit.     Past Medical History  Diagnosis Date  . Hyperlipidemia   . Hypertension   . Anxiety   . Facial droop 1935    acquired during forceps delivery, some residual R visual loss  . Hx of breast cancer 2004  . Arthritis     mild in lower back (Drs Posey Pronto and Joelyn Oms)  . GERD (gastroesophageal reflux disease)   . Bradycardia     a. Noted 06/2012.  Marland Kitchen Hypotension     a. Associated w/ dialysis.  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 [name])  . Osteoporosis 11/2013    DEXA -3.4 radius  . GAVE (gastric antral vascular ectasia) 08/2014    s/p EGD with APC  . Anemia in chronic kidney disease     IV iron infusion  . End stage renal disease on dialysis [facility])     HD M,W,F [place] AVF now LUE AVF  . Cancer [facility])     Left Breast 12 yrs. ago  . Hearing aid worn     Bilateral  . Sight impaired     Wears contact in Left Eye  . Cardiac murmur     a. thought due to AVF (2D echo 06/2012 without significant valvular disease).     Past Surgical History  Procedure Laterality Date  . Cataract extraction  1981  . Av fistula placement  07/20/10  Right brachiocephalic AVF  . Umbilical hernia repair  2011  . Dexa  2013    [facility]  . US echocardiography  06/2012    LVH, EF 65%, grade 1 diastol dysfunction, mod dliated LAD  . Eye surgery  1966    regular cataract  . Breast lumpectomy Left   . Breast biopsy Left   . Bascilic vein transposition Left 07/24/2013    Procedure: BASILIC VEIN TRANSPOSITION;  Surgeon: [name], MD;  Location: [place];  Service: Vascular;  Laterality: Left;  . Shuntogram N/A 04/16/2011    Procedure: Earney Mallet;  Surgeon: [name], MD;  Location: [facility] CATH LAB;  Service: Cardiovascular;  Laterality: N/A;  . Shuntogram Right 07/05/2013    Procedure: FISTULOGRAM;  Surgeon: [name], MD;  Location: [facility] CATH LAB;  Service: Cardiovascular;   Laterality: Right;  . Esophagogastroduodenoscopy N/A 04/09/2014    Procedure: ESOPHAGOGASTRODUODENOSCOPY (EGD);  Surgeon: [name], MD;  Location: [place];  Service: Endoscopy;  Laterality: N/A;  . Peripheral vascular catheterization N/A 08/15/2014    Procedure: A/V Shuntogram/Fistulagram;  Surgeon: [name], MD;  Location: [place] CV LAB;  Service: Cardiovascular;  Laterality: N/A;  . Esophagogastroduodenoscopy N/A 09/23/2014    Procedure: ESOPHAGOGASTRODUODENOSCOPY (EGD);  Surgeon: [name], MD;  Location: [place];  Service: Endoscopy;  Laterality: N/A;  . Hernia repair    . Esophagogastroduodenoscopy (egd) with propofol N/A 11/12/2014    Procedure: ESOPHAGOGASTRODUODENOSCOPY (EGD) WITH PROPOFOL;  Surgeon: [name], MD;  [facility]: [facility] ENDOSCOPY;  Service: Endoscopy;  Laterality: N/A;  . Hot hemostasis N/A 11/12/2014    Procedure: HOT HEMOSTASIS (ARGON PLASMA COAGULATION/BICAP);  Surgeon: [name], MD;  Location: [facility];  Service: Endoscopy;  Laterality: N/A;  . Esophagogastroduodenoscopy N/A 12/26/2014    neg H pylori, benign biopsies; ESOPHAGOGASTRODUODENOSCOPY (EGD);  Surgeon: [name], MD    Social History   Social History  . Marital Status: Single    Spouse Name: N/A  . Number of Children: N/A  . Years of Education: N/A   Occupational History  . 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 Smoking status: Former Smoker -- 2 years    Types: Cigarettes    Quit date: 03/29/1953  . Smokeless tobacco: Never Used  . Alcohol Use: 0.0 oz/week    0 Standard drinks or equivalent per week     Comment: occasional  . Drug Use: No  . Sexual Activity: Not on file   Other Topics Concern  . Not on file   Social History Narrative   As of 03/2013. Lives alone with her small dog.  Bother and sister in law died last year.     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 Remaining systems are negative.  Physical Exam: Well-developed well-nourished in no acute distress.  Skin is warm and dry.  HEENT is normal.  Neck is supple.  Chest is clear to auscultation with normal expansion.  Cardiovascular exam is regular rate and rhythm. 2/6 systolic murmur left sternal border. Abdominal exam nontender or distended. No masses palpated. Extremities show no edema. Fistula left upper extremity neuro grossly intact

## 2015-04-02 DIAGNOSIS — M9903 Segmental and somatic dysfunction of lumbar region: Secondary | ICD-10-CM | POA: Diagnosis not present

## 2015-04-02 DIAGNOSIS — M9905 Segmental and somatic dysfunction of pelvic region: Secondary | ICD-10-CM | POA: Diagnosis not present

## 2015-04-02 DIAGNOSIS — M955 Acquired deformity of pelvis: Secondary | ICD-10-CM | POA: Diagnosis not present

## 2015-04-02 DIAGNOSIS — M5136 Other intervertebral disc degeneration, lumbar region: Secondary | ICD-10-CM | POA: Diagnosis not present

## 2015-04-03 ENCOUNTER — Encounter: Payer: Self-pay | Admitting: Cardiology

## 2015-04-03 ENCOUNTER — Ambulatory Visit (INDEPENDENT_AMBULATORY_CARE_PROVIDER_SITE_OTHER): Payer: Medicare Other | Admitting: Cardiology

## 2015-04-03 ENCOUNTER — Encounter: Payer: Self-pay | Admitting: *Deleted

## 2015-04-03 ENCOUNTER — Other Ambulatory Visit: Payer: Self-pay | Admitting: *Deleted

## 2015-04-03 VITALS — BP 150/74 | HR 97 | Ht 59.0 in | Wt 115.0 lb

## 2015-04-03 DIAGNOSIS — I214 Non-ST elevation (NSTEMI) myocardial infarction: Secondary | ICD-10-CM

## 2015-04-03 DIAGNOSIS — N186 End stage renal disease: Secondary | ICD-10-CM

## 2015-04-03 DIAGNOSIS — I10 Essential (primary) hypertension: Secondary | ICD-10-CM | POA: Diagnosis not present

## 2015-04-03 DIAGNOSIS — R9430 Abnormal result of cardiovascular function study, unspecified: Secondary | ICD-10-CM | POA: Diagnosis not present

## 2015-04-03 DIAGNOSIS — R0602 Shortness of breath: Secondary | ICD-10-CM

## 2015-04-03 DIAGNOSIS — R9439 Abnormal result of other cardiovascular function study: Secondary | ICD-10-CM

## 2015-04-03 DIAGNOSIS — E785 Hyperlipidemia, unspecified: Secondary | ICD-10-CM | POA: Diagnosis not present

## 2015-04-03 DIAGNOSIS — Z992 Dependence on renal dialysis: Secondary | ICD-10-CM

## 2015-04-03 NOTE — Assessment & Plan Note (Signed)
Occurred previously in the setting of GI bleed in June. Follow-up nuclear study abnormal. She is tolerating aspirin as outlined. Proceed with cardiac catheterization.

## 2015-04-03 NOTE — Assessment & Plan Note (Signed)
Continue statin. 

## 2015-04-03 NOTE — Assessment & Plan Note (Signed)
Continue present blood pressure medications. 

## 2015-04-03 NOTE — Patient Instructions (Signed)
Medication Instructions:   NO CHANGE  Labwork:  Your physician recommends that you return for lab work ON Wednesday 04-09-15 AT DIALYSIS  Testing/Procedures:  TRANSFUSION OF BLOOD-Tuesday 04-08-15 AT [facility]  Follow-Up:  Your physician recommends that you schedule a follow-up appointment in: [place]

## 2015-04-03 NOTE — Assessment & Plan Note (Signed)
Management per nephrology.

## 2015-04-03 NOTE — Assessment & Plan Note (Signed)
Patient has dyspnea and an abnormal functional study. We will plan to proceed with cardiac catheterization. The risks and benefits were discussed and she agrees to proceed. Note she does have a history of GI bleed. However she has tolerated aspirin with no recurrence. If there is a lesion amenable to PCI I would favor a bare metal stent. Also note she has chronic anemia. Her hemoglobin runs in the 7-8 range by her report. We will plan to proceed with transfusion of 2 units of packed red blood cells on a day prior to a dialysis day. She will undergo dialysis the following day and have her hemoglobin checked. If stable she will proceed with cardiac catheterization the following day.

## 2015-04-07 ENCOUNTER — Telehealth: Payer: Self-pay | Admitting: Cardiology

## 2015-04-07 NOTE — Telephone Encounter (Signed)
Spoke with pt, questions regarding cath and lab work answered.

## 2015-04-07 NOTE — Telephone Encounter (Signed)
Patient states she is having a procedure on Thursday and she has some questions.

## 2015-04-08 ENCOUNTER — Ambulatory Visit (HOSPITAL_COMMUNITY)
Admission: RE | Admit: 2015-04-08 | Discharge: 2015-04-08 | Disposition: A | Discharge status: Home / Self Care | Payer: Medicare Other | Source: Ambulatory Visit | Attending: Cardiology | Admitting: Cardiology

## 2015-04-08 ENCOUNTER — Telehealth: Payer: Self-pay | Admitting: Cardiology

## 2015-04-08 DIAGNOSIS — D649 Anemia, unspecified: Secondary | ICD-10-CM | POA: Insufficient documentation

## 2015-04-08 DIAGNOSIS — R0602 Shortness of breath: Secondary | ICD-10-CM | POA: Insufficient documentation

## 2015-04-08 LAB — PREPARE RBC (CROSSMATCH)

## 2015-04-08 MED ORDER — SODIUM CHLORIDE 0.9 % IV SOLN: Freq: Once | INTRAVENOUS | Status: DC

## 2015-04-08 MED ORDER — ACETAMINOPHEN 325 MG PO TABS
650.0000 mg | ORAL_TABLET | Freq: Once | ORAL | Status: AC
  Administered 2015-04-08: 650 mg via ORAL
  Filled 2015-04-08: qty 2

## 2015-04-08 NOTE — Progress Notes (Signed)
Dr Stanford Breed Anemia/Shortness of Breath   Patient received 2 units of PRBC's.  No reaction to transfusion.

## 2015-04-08 NOTE — Telephone Encounter (Signed)
Spoke with [name], pt is currently receiving 2 units of blood. Her bp is 182/71, no other problems. The pt takes her medications in the evening. She was making [place] aware.

## 2015-04-08 NOTE — Progress Notes (Addendum)
Contacted Dr Jacalyn Lefevre office to inform him of patient's BP of 182/71. Spoke with Luisa Dago, RN.  I informed her that patient had not taken her daily BP medication because she takes it in the afternoon.  Hilda Blades stated that she would contact Dr Stanford Breed and contact us back if he had any concerns.

## 2015-04-09 ENCOUNTER — Other Ambulatory Visit: Payer: Self-pay | Admitting: *Deleted

## 2015-04-09 ENCOUNTER — Other Ambulatory Visit
Admission: RE | Admit: 2015-04-09 | Discharge: 2015-04-09 | Disposition: A | Discharge status: Home / Self Care | Payer: Medicare Other | Source: Ambulatory Visit | Attending: Cardiology | Admitting: Cardiology

## 2015-04-09 DIAGNOSIS — M5136 Other intervertebral disc degeneration, lumbar region: Secondary | ICD-10-CM | POA: Diagnosis not present

## 2015-04-09 DIAGNOSIS — I5033 Acute on chronic diastolic (congestive) heart failure: Secondary | ICD-10-CM

## 2015-04-09 DIAGNOSIS — N189 Chronic kidney disease, unspecified: Secondary | ICD-10-CM | POA: Insufficient documentation

## 2015-04-09 DIAGNOSIS — M9903 Segmental and somatic dysfunction of lumbar region: Secondary | ICD-10-CM | POA: Diagnosis not present

## 2015-04-09 DIAGNOSIS — D631 Anemia in chronic kidney disease: Secondary | ICD-10-CM

## 2015-04-09 DIAGNOSIS — M9905 Segmental and somatic dysfunction of pelvic region: Secondary | ICD-10-CM | POA: Diagnosis not present

## 2015-04-09 DIAGNOSIS — M955 Acquired deformity of pelvis: Secondary | ICD-10-CM | POA: Diagnosis not present

## 2015-04-09 LAB — BASIC METABOLIC PANEL
Anion gap: 9 (ref 5–15)
BUN: 19 mg/dL (ref 6–20)
CALCIUM: 8.7 mg/dL — AB (ref 8.9–10.3)
CO2: 32 mmol/L (ref 22–32)
Chloride: 100 mmol/L — ABNORMAL LOW (ref 101–111)
Creatinine, Ser: 3.67 mg/dL — ABNORMAL HIGH (ref 0.44–1.00)
GFR calc Af Amer: 12 mL/min — ABNORMAL LOW (ref >60)
GFR, EST NON AFRICAN AMERICAN: 11 mL/min — AB (ref >60)
GLUCOSE: 103 mg/dL — AB (ref 65–99)
POTASSIUM: 4.3 mmol/L (ref 3.5–5.1)
SODIUM: 141 mmol/L (ref 135–145)

## 2015-04-09 LAB — CBC WITH DIFFERENTIAL/PLATELET
BASOS ABS: 0 10*3/uL (ref 0–0.1)
BASOS PCT: 1 %
EOS ABS: 0.1 10*3/uL (ref 0–0.7)
Eosinophils Relative: 2 %
HEMATOCRIT: 38 % (ref 35.0–47.0)
HEMOGLOBIN: 12.3 g/dL (ref 12.0–16.0)
Lymphocytes Relative: 13 %
Lymphs Abs: 0.6 10*3/uL — ABNORMAL LOW (ref 1.0–3.6)
MCH: 30.5 pg (ref 26.0–34.0)
MCHC: 32.5 g/dL (ref 32.0–36.0)
MCV: 94 fL (ref 80.0–100.0)
MONO ABS: 0.6 10*3/uL (ref 0.2–0.9)
MONOS PCT: 13 %
NEUTROS ABS: 3.6 10*3/uL (ref 1.4–6.5)
NEUTROS PCT: 71 %
Platelets: 205 10*3/uL (ref 150–440)
RBC: 4.04 MIL/uL (ref 3.80–5.20)
RDW: 18.5 % — AB (ref 11.5–14.5)
WBC: 5 10*3/uL (ref 3.6–11.0)

## 2015-04-09 LAB — TYPE AND SCREEN
ABO/RH(D): O POS
ANTIBODY SCREEN: NEGATIVE
UNIT DIVISION: 0
Unit division: 0

## 2015-04-09 LAB — PROTIME-INR
INR: 1
PROTHROMBIN TIME: 13.4 s (ref 11.4–15.0)

## 2015-04-09 NOTE — H&P (Signed)
Chart reviewed. End-stage renal disease on dialysis. Chronic anemia with hemoglobin in the 7-8 range. Dyspnea on exertion has been noted. Abnormal myocardial perfusion studies 2. Relatively recent hospital stay for heart failure with elevated troponin (1.3). Transfusion received on 04/08/15, dialysis on 04/09/15, and diagnostic cath scheduled for 04/10/2015.

## 2015-04-10 ENCOUNTER — Encounter (HOSPITAL_COMMUNITY)
Admission: RE | Disposition: A | Discharge status: Home / Self Care | Payer: Self-pay | Source: Ambulatory Visit | Attending: Interventional Cardiology

## 2015-04-10 ENCOUNTER — Ambulatory Visit (HOSPITAL_COMMUNITY)
Admission: RE | Admit: 2015-04-10 | Discharge: 2015-04-10 | Disposition: A | Discharge status: Home / Self Care | Payer: Medicare Other | Source: Ambulatory Visit | Attending: Interventional Cardiology | Admitting: Interventional Cardiology

## 2015-04-10 DIAGNOSIS — M810 Age-related osteoporosis without current pathological fracture: Secondary | ICD-10-CM | POA: Diagnosis not present

## 2015-04-10 DIAGNOSIS — N186 End stage renal disease: Secondary | ICD-10-CM | POA: Diagnosis not present

## 2015-04-10 DIAGNOSIS — M1990 Unspecified osteoarthritis, unspecified site: Secondary | ICD-10-CM | POA: Insufficient documentation

## 2015-04-10 DIAGNOSIS — K219 Gastro-esophageal reflux disease without esophagitis: Secondary | ICD-10-CM | POA: Diagnosis not present

## 2015-04-10 DIAGNOSIS — I132 Hypertensive heart and chronic kidney disease with heart failure and with stage 5 chronic kidney disease, or end stage renal disease: Secondary | ICD-10-CM | POA: Diagnosis not present

## 2015-04-10 DIAGNOSIS — Z87891 Personal history of nicotine dependence: Secondary | ICD-10-CM | POA: Diagnosis not present

## 2015-04-10 DIAGNOSIS — R9439 Abnormal result of other cardiovascular function study: Secondary | ICD-10-CM | POA: Insufficient documentation

## 2015-04-10 DIAGNOSIS — E785 Hyperlipidemia, unspecified: Secondary | ICD-10-CM | POA: Insufficient documentation

## 2015-04-10 DIAGNOSIS — I2510 Atherosclerotic heart disease of native coronary artery without angina pectoris: Secondary | ICD-10-CM | POA: Diagnosis not present

## 2015-04-10 DIAGNOSIS — I5041 Acute combined systolic (congestive) and diastolic (congestive) heart failure: Secondary | ICD-10-CM | POA: Insufficient documentation

## 2015-04-10 DIAGNOSIS — Z853 Personal history of malignant neoplasm of breast: Secondary | ICD-10-CM | POA: Diagnosis not present

## 2015-04-10 DIAGNOSIS — Z7982 Long term (current) use of aspirin: Secondary | ICD-10-CM | POA: Insufficient documentation

## 2015-04-10 DIAGNOSIS — D631 Anemia in chronic kidney disease: Secondary | ICD-10-CM | POA: Insufficient documentation

## 2015-04-10 DIAGNOSIS — Z992 Dependence on renal dialysis: Secondary | ICD-10-CM | POA: Insufficient documentation

## 2015-04-10 DIAGNOSIS — I340 Nonrheumatic mitral (valve) insufficiency: Secondary | ICD-10-CM | POA: Diagnosis not present

## 2015-04-10 DIAGNOSIS — F419 Anxiety disorder, unspecified: Secondary | ICD-10-CM | POA: Insufficient documentation

## 2015-04-10 DIAGNOSIS — I5033 Acute on chronic diastolic (congestive) heart failure: Secondary | ICD-10-CM | POA: Diagnosis present

## 2015-04-10 DIAGNOSIS — I214 Non-ST elevation (NSTEMI) myocardial infarction: Secondary | ICD-10-CM | POA: Diagnosis present

## 2015-04-10 DIAGNOSIS — R0602 Shortness of breath: Secondary | ICD-10-CM

## 2015-04-10 HISTORY — PX: CARDIAC CATHETERIZATION: SHX172

## 2015-04-10 SURGERY — LEFT HEART CATH AND CORONARY ANGIOGRAPHY

## 2015-04-10 MED ORDER — HEPARIN (PORCINE) IN NACL 2-0.9 UNIT/ML-% IJ SOLN
INTRAMUSCULAR | Status: AC
  Filled 2015-04-10: qty 500

## 2015-04-10 MED ORDER — SODIUM CHLORIDE 0.9 % WEIGHT BASED INFUSION: 1.0000 mL/kg/h | INTRAVENOUS | Status: DC

## 2015-04-10 MED ORDER — SODIUM CHLORIDE 0.9 % IJ SOLN: 3.0000 mL | Freq: Two times a day (BID) | INTRAMUSCULAR | Status: DC

## 2015-04-10 MED ORDER — MIDAZOLAM HCL 2 MG/2ML IJ SOLN
INTRAMUSCULAR | Status: DC | PRN
  Administered 2015-04-10: 1 mg via INTRAVENOUS

## 2015-04-10 MED ORDER — FENTANYL CITRATE (PF) 100 MCG/2ML IJ SOLN
INTRAMUSCULAR | Status: AC
  Filled 2015-04-10: qty 2

## 2015-04-10 MED ORDER — LIDOCAINE HCL (PF) 1 % IJ SOLN
INTRAMUSCULAR | Status: DC | PRN
  Administered 2015-04-10: 11:00:00

## 2015-04-10 MED ORDER — MIDAZOLAM HCL 2 MG/2ML IJ SOLN
INTRAMUSCULAR | Status: AC
  Filled 2015-04-10: qty 2

## 2015-04-10 MED ORDER — ASPIRIN 81 MG PO CHEW: 81.0000 mg | CHEWABLE_TABLET | ORAL | Status: DC

## 2015-04-10 MED ORDER — IOHEXOL 350 MG/ML SOLN
INTRAVENOUS | Status: DC | PRN
  Administered 2015-04-10: 120 mL via INTRA_ARTERIAL

## 2015-04-10 MED ORDER — SODIUM CHLORIDE 0.9 % WEIGHT BASED INFUSION: 3.0000 mL/kg/h | INTRAVENOUS | Status: AC

## 2015-04-10 MED ORDER — SODIUM CHLORIDE 0.9 % IV SOLN: 250.0000 mL | INTRAVENOUS | Status: DC | PRN

## 2015-04-10 MED ORDER — SODIUM CHLORIDE 0.9 % IJ SOLN: 3.0000 mL | INTRAMUSCULAR | Status: DC | PRN

## 2015-04-10 MED ORDER — LIDOCAINE HCL (PF) 1 % IJ SOLN
INTRAMUSCULAR | Status: AC
  Filled 2015-04-10: qty 30

## 2015-04-10 MED ORDER — CALCIUM CARBONATE ANTACID 500 MG PO CHEW
4.0000 | CHEWABLE_TABLET | ORAL | Status: AC
  Administered 2015-04-10: 2000 mg via ORAL
  Filled 2015-04-10: qty 4

## 2015-04-10 MED ORDER — FENTANYL CITRATE (PF) 100 MCG/2ML IJ SOLN
INTRAMUSCULAR | Status: DC | PRN
  Administered 2015-04-10: 25 ug via INTRAVENOUS

## 2015-04-10 SURGICAL SUPPLY — 12 items
CATH INFINITI 5FR JL4 (CATHETERS) ×2 IMPLANT
CATH INFINITI 5FR JL5 (CATHETERS) ×2 IMPLANT
CATH LAUNCHER 5F JL3.5 (CATHETERS) IMPLANT
CATH SITESEER 5F MULTI A 2 (CATHETERS) ×2 IMPLANT
CATHETER LAUNCHER 5F JL3.5 (CATHETERS) ×3
DEVICE WIRE ANGIOSEAL 6FR (Vascular Products) ×2 IMPLANT
KIT HEART LEFT (KITS) ×3 IMPLANT
PACK CARDIAC CATHETERIZATION (CUSTOM PROCEDURE TRAY) ×3 IMPLANT
SHEATH PINNACLE 5F 10CM (SHEATH) ×2 IMPLANT
TRANSDUCER W/STOPCOCK (MISCELLANEOUS) ×3 IMPLANT
WIRE EMERALD 3MM-J .035X150CM (WIRE) ×2 IMPLANT
WIRE SAFE-T 1.5MM-J .035X260CM (WIRE) ×1 IMPLANT

## 2015-04-10 NOTE — H&P (View-Only) (Signed)
HPI: FU bradycardia and CAD. She is dialysis dependent. Holter monitor in April of 2014 showed sinus rhythm to sinus bradycardia with PACs and PVCs. Patient admitted in June 2016 with GI bleed. She had dyspnea with ST depression noted on electrocardiogram. Troponin peaked at 1.34. Treated medically. Echocardiogram June 2016 showed ejection fraction of 50% with inferior lateral hypokinesis. There was mild mitral regurgitation, biatrial enlargement and mild right ventricular enlargement. Nuclear study repeated September 2016. Ejection fraction 47%. There was an anterior lateral perfusion defect consistent with prior infarct and peri-infarct ischemia. EGD showed vascular ectasia. At last ov, catheterization was considered but pt having issues with intermittent anemia; ASA added to see if she could tolerate antiplt therapy. Since last seen, She has some dyspnea on exertion but no orthopnea, PND or pedal edema. No chest pain.  Current Outpatient Prescriptions  Medication Sig Dispense Refill  . acetaminophen (TYLENOL) 500 MG tablet Take 1,000 mg by mouth every 6 (six) hours as needed for moderate pain.    Marland Kitchen amLODipine (NORVASC) 2.5 MG tablet Take 2.5 mg by mouth at bedtime.    . Artificial Tear Ointment (REFRESH P.M. OP) Place 1 application into the right eye daily.    Marland Kitchen aspirin EC 81 MG tablet Take 1 tablet (81 mg total) by mouth daily. 90 tablet 3  . B Complex-C-Folic Acid (RENA-VITE PO) Take 1 tablet by mouth daily with breakfast.     . Calcium Carbonate Antacid (TUMS ULTRA 1000 PO) Take 2,000 mg by mouth 3 (three) times daily with meals.     . cholecalciferol (VITAMIN D) 1000 UNITS tablet Take 1,000 Units by mouth every Monday, Wednesday, and Friday.     . pantoprazole (PROTONIX) 40 MG tablet Take 1 tablet (40 mg total) by mouth 2 (two) times daily. 30 tablet 5  . PRESCRIPTION MEDICATION [facility] Anemia Therapy    . simvastatin (ZOCOR) 20 MG tablet Take 1 tablet (20 mg total) by mouth at bedtime. 30  tablet 6   No current facility-administered medications for this visit.     Past Medical History  Diagnosis Date  . Hyperlipidemia   . Hypertension   . Anxiety   . Facial droop 1935    acquired during forceps delivery, some residual R visual loss  . Hx of breast cancer 2004  . Arthritis     mild in lower back (Drs Posey Pronto and Joelyn Oms)  . GERD (gastroesophageal reflux disease)   . Bradycardia     a. Noted 06/2012.  Marland Kitchen Hypotension     a. Associated w/ dialysis.  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 [name])  . Osteoporosis 11/2013    DEXA -3.4 radius  . GAVE (gastric antral vascular ectasia) 08/2014    s/p EGD with APC  . Anemia in chronic kidney disease     IV iron infusion  . End stage renal disease on dialysis [facility])     HD M,W,F [place] AVF now LUE AVF  . Cancer [facility])     Left Breast 12 yrs. ago  . Hearing aid worn     Bilateral  . Sight impaired     Wears contact in Left Eye  . Cardiac murmur     a. thought due to AVF (2D echo 06/2012 without significant valvular disease).     Past Surgical History  Procedure Laterality Date  . Cataract extraction  1981  . Av fistula placement  07/20/10  Right brachiocephalic AVF  . Umbilical hernia repair  2011  . Dexa  2013    [facility]  . US echocardiography  06/2012    LVH, EF 65%, grade 1 diastol dysfunction, mod dliated LAD  . Eye surgery  1966    regular cataract  . Breast lumpectomy Left   . Breast biopsy Left   . Bascilic vein transposition Left 07/24/2013    Procedure: BASILIC VEIN TRANSPOSITION;  Surgeon: [name], MD;  Location: [place];  Service: Vascular;  Laterality: Left;  . Shuntogram N/A 04/16/2011    Procedure: Earney Mallet;  Surgeon: [name], MD;  Location: [facility] CATH LAB;  Service: Cardiovascular;  Laterality: N/A;  . Shuntogram Right 07/05/2013    Procedure: FISTULOGRAM;  Surgeon: [name], MD;  Location: [facility] CATH LAB;  Service: Cardiovascular;   Laterality: Right;  . Esophagogastroduodenoscopy N/A 04/09/2014    Procedure: ESOPHAGOGASTRODUODENOSCOPY (EGD);  Surgeon: [name], MD;  Location: [place];  Service: Endoscopy;  Laterality: N/A;  . Peripheral vascular catheterization N/A 08/15/2014    Procedure: A/V Shuntogram/Fistulagram;  Surgeon: [name], MD;  Location: [place] CV LAB;  Service: Cardiovascular;  Laterality: N/A;  . Esophagogastroduodenoscopy N/A 09/23/2014    Procedure: ESOPHAGOGASTRODUODENOSCOPY (EGD);  Surgeon: [name], MD;  Location: [place];  Service: Endoscopy;  Laterality: N/A;  . Hernia repair    . Esophagogastroduodenoscopy (egd) with propofol N/A 11/12/2014    Procedure: ESOPHAGOGASTRODUODENOSCOPY (EGD) WITH PROPOFOL;  Surgeon: [name], MD;  [facility] ENDOS[facility];  Service: Endoscopy;  Laterality: N/A;  . Hot hemostasis N/A 11/12/2014    Procedure: HOT HEMOSTASIS (ARGON PLASMA COAGULATION/BICAP);  Surgeon: [name], MD;  [facility];  Service: Endoscopy;  Laterality: N/A;  . Esophagogastroduodenoscopy N/A 12/26/2014    neg H pylori, benign biopsies; ESOPHAGOGASTRODUODENOSCOPY (EGD);  Surgeon: [name], MD    Social History   Social History  . Marital Status: Single    Spouse Name: N/A  . Number of Children: N/A  . Years of Education: N/A   Occupational History  . 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 Smoking status: Former Smoker -- 2 years    Types: Cigarettes    Quit date: 03/29/1953  . Smokeless tobacco: Never Used  . Alcohol Use: 0.0 oz/week    0 Standard drinks or equivalent per week     Comment: occasional  . Drug Use: No  . Sexual Activity: Not on file   Other Topics Concern  . Not on file   Social History Narrative   As of 03/2013. Lives alone with her small dog.  Bother and sister in law died last year.     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 Remaining systems are negative.  Physical Exam: Well-developed well-nourished in no acute distress.  Skin is warm and dry.  HEENT is normal.  Neck is supple.  Chest is clear to auscultation with normal expansion.  Cardiovascular exam is regular rate and rhythm. 2/6 systolic murmur left sternal border. Abdominal exam nontender or distended. No masses palpated. Extremities show no edema. Fistula left upper extremity neuro grossly intact

## 2015-04-10 NOTE — Discharge Instructions (Signed)
Angiogram, Care After °Refer to this sheet in the next few weeks. These instructions provide you with information about caring for yourself after your procedure. Your health care provider may also give you more specific instructions. Your treatment has been planned according to current medical practices, but problems sometimes occur. Call your health care provider if you have any problems or questions after your procedure. °WHAT TO EXPECT AFTER THE PROCEDURE °After your procedure, it is typical to have the following: °· Bruising at the catheter insertion site that usually fades within 1-2 weeks. °· Blood collecting in the tissue (hematoma) that may be painful to the touch. It should usually decrease in size and tenderness within 1-2 weeks. °HOME CARE INSTRUCTIONS °· Take medicines only as directed by your health care provider. °· You may shower 24-48 hours after the procedure or as directed by your health care provider. Remove the bandage (dressing) and gently wash the site with plain soap and water. Pat the area dry with a clean towel. Do not rub the site, because this may cause bleeding. °· Do not take baths, swim, or use a hot tub until your health care provider approves. °· Check your insertion site every day for redness, swelling, or drainage. °· Do not apply powder or lotion to the site. °· Do not lift over 10 lb (4.5 kg) for 5 days after your procedure or as directed by your health care provider. °· Ask your health care provider when it is okay to: °¨ Return to work or school. °¨ Resume usual physical activities or sports. °¨ Resume sexual activity. °· Do not drive home if you are discharged the same day as the procedure. Have someone else drive you. °· You may drive 24 hours after the procedure unless otherwise instructed by your health care provider. °· Do not operate machinery or power tools for 24 hours after the procedure or as directed by your health care provider. °· If your procedure was done as an  outpatient procedure, which means that you went home the same day as your procedure, a responsible adult should be with you for the first 24 hours after you arrive home. °· Keep all follow-up visits as directed by your health care provider. This is important. °SEEK MEDICAL CARE IF: °· You have a fever. °· You have chills. °· You have increased bleeding from the catheter insertion site. Hold pressure on the site. Call 911 °SEEK IMMEDIATE MEDICAL CARE IF: °· You have unusual pain at the catheter insertion site. °· You have redness, warmth, or swelling at the catheter insertion site. °· You have drainage (other than a small amount of blood on the dressing) from the catheter insertion site. °· The catheter insertion site is bleeding, and the bleeding does not stop after 30 minutes of holding steady pressure on the site. °· The area near or just beyond the catheter insertion site becomes pale, cool, tingly, or numb. °  °This information is not intended to replace advice given to you by your health care provider. Make sure you discuss any questions you have with your health care provider. °  °Document Released: 10/01/2004 Document Revised: 04/05/2014 Document Reviewed: 08/16/2012 °Elsevier Interactive Patient Education ©2016 [employer] ° °

## 2015-04-10 NOTE — Interval H&P Note (Signed)
Cath Lab Visit (complete for each Cath Lab visit)  Clinical Evaluation Leading to the Procedure:   ACS: No.  Non-ACS:    Anginal Classification: CCS III  Anti-ischemic medical therapy: No Therapy  Non-Invasive Test Results: No non-invasive testing performed  Prior CABG: No previous CABG      History and Physical Interval Note:  04/10/2015 10:31 AM  [name]  has presented today for surgery, with the diagnosis of shortness of breath/abnormal nuc  The various methods of treatment have been discussed with the patient and family. After consideration of risks, benefits and other options for treatment, the patient has consented to  Procedure(s): Left Heart Cath and Coronary Angiography (N/A) as a surgical intervention .  The patient's history has been reviewed, patient examined, no change in status, stable for surgery.  I have reviewed the patient's chart and labs.  Questions were answered to the patient's satisfaction.     Sinclair Grooms

## 2015-04-10 NOTE — Research (Signed)
CADLAD Informed Consent   Subject Name: [name]  Subject met inclusion and exclusion criteria.  The informed consent form, study requirements and expectations were reviewed with the subject and questions and concerns were addressed prior to the signing of the consent form.  The subject verbalized understanding of the trail requirements.  The subject agreed to participate in the CADLAD trial and signed the informed consent.  The informed consent was obtained prior to performance of any protocol-specific procedures for the subject.  A copy of the signed informed consent was given to the subject and a copy was placed in the subject's medical record.  Hedrick,Lindsea Olivar W 04/10/2015, 0930

## 2015-04-11 ENCOUNTER — Encounter (HOSPITAL_COMMUNITY): Payer: Self-pay | Admitting: Interventional Cardiology

## 2015-04-16 DIAGNOSIS — M9903 Segmental and somatic dysfunction of lumbar region: Secondary | ICD-10-CM | POA: Diagnosis not present

## 2015-04-16 DIAGNOSIS — M5136 Other intervertebral disc degeneration, lumbar region: Secondary | ICD-10-CM | POA: Diagnosis not present

## 2015-04-16 DIAGNOSIS — M955 Acquired deformity of pelvis: Secondary | ICD-10-CM | POA: Diagnosis not present

## 2015-04-16 DIAGNOSIS — M9905 Segmental and somatic dysfunction of pelvic region: Secondary | ICD-10-CM | POA: Diagnosis not present

## 2015-04-21 ENCOUNTER — Telehealth: Payer: Self-pay | Admitting: Cardiology

## 2015-04-21 NOTE — Telephone Encounter (Signed)
[name] is calling to see what the plans are for [name] because she had three vessel disease on her cath that was done on 04/10/15.Marland Kitchen Please call   Thanks

## 2015-04-22 ENCOUNTER — Other Ambulatory Visit: Payer: Self-pay | Admitting: Vascular Surgery

## 2015-04-22 DIAGNOSIS — E785 Hyperlipidemia, unspecified: Secondary | ICD-10-CM | POA: Diagnosis not present

## 2015-04-22 DIAGNOSIS — Y841 Kidney dialysis as the cause of abnormal reaction of the patient, or of later complication, without mention of misadventure at the time of the procedure: Secondary | ICD-10-CM | POA: Diagnosis not present

## 2015-04-22 DIAGNOSIS — T82318A Breakdown (mechanical) of other vascular grafts, initial encounter: Secondary | ICD-10-CM | POA: Diagnosis not present

## 2015-04-22 DIAGNOSIS — N186 End stage renal disease: Secondary | ICD-10-CM | POA: Diagnosis not present

## 2015-04-22 DIAGNOSIS — Z992 Dependence on renal dialysis: Secondary | ICD-10-CM | POA: Diagnosis not present

## 2015-04-22 DIAGNOSIS — T82818A Embolism of vascular prosthetic devices, implants and grafts, initial encounter: Secondary | ICD-10-CM | POA: Diagnosis not present

## 2015-04-22 DIAGNOSIS — I10 Essential (primary) hypertension: Secondary | ICD-10-CM | POA: Diagnosis not present

## 2015-04-22 NOTE — Telephone Encounter (Signed)
Spoke with ms brown, the pt will be schedule to come in and see dr Stanford Breed to discuss the cath report and also what the next step will be. Left message for pt of above.

## 2015-04-23 DIAGNOSIS — M9903 Segmental and somatic dysfunction of lumbar region: Secondary | ICD-10-CM | POA: Diagnosis not present

## 2015-04-23 DIAGNOSIS — M5136 Other intervertebral disc degeneration, lumbar region: Secondary | ICD-10-CM | POA: Diagnosis not present

## 2015-04-23 DIAGNOSIS — M9905 Segmental and somatic dysfunction of pelvic region: Secondary | ICD-10-CM | POA: Diagnosis not present

## 2015-04-23 DIAGNOSIS — M955 Acquired deformity of pelvis: Secondary | ICD-10-CM | POA: Diagnosis not present

## 2015-04-25 ENCOUNTER — Encounter: Payer: Self-pay | Admitting: Cardiology

## 2015-04-25 NOTE — Telephone Encounter (Signed)
New message     Pt states the nurse is supposed to call her and schedule an appt with Dr Stanford Breed for next week.  She has not received that call

## 2015-04-25 NOTE — Telephone Encounter (Signed)
This encounter was created in error - please disregard.

## 2015-04-28 ENCOUNTER — Ambulatory Visit
Admission: RE | Admit: 2015-04-28 | Discharge: 2015-04-28 | Disposition: A | Discharge status: Home / Self Care | Payer: Medicare Other | Source: Ambulatory Visit | Attending: Vascular Surgery | Admitting: Vascular Surgery

## 2015-04-28 ENCOUNTER — Encounter: Payer: Self-pay | Admitting: *Deleted

## 2015-04-28 ENCOUNTER — Encounter
Admission: RE | Disposition: A | Discharge status: Home / Self Care | Payer: Self-pay | Source: Ambulatory Visit | Attending: Vascular Surgery

## 2015-04-28 DIAGNOSIS — N186 End stage renal disease: Secondary | ICD-10-CM | POA: Diagnosis not present

## 2015-04-28 DIAGNOSIS — Y841 Kidney dialysis as the cause of abnormal reaction of the patient, or of later complication, without mention of misadventure at the time of the procedure: Secondary | ICD-10-CM | POA: Diagnosis not present

## 2015-04-28 DIAGNOSIS — T82858A Stenosis of vascular prosthetic devices, implants and grafts, initial encounter: Secondary | ICD-10-CM | POA: Diagnosis not present

## 2015-04-28 DIAGNOSIS — Z853 Personal history of malignant neoplasm of breast: Secondary | ICD-10-CM | POA: Diagnosis not present

## 2015-04-28 DIAGNOSIS — I10 Essential (primary) hypertension: Secondary | ICD-10-CM | POA: Diagnosis not present

## 2015-04-28 DIAGNOSIS — Z79899 Other long term (current) drug therapy: Secondary | ICD-10-CM | POA: Diagnosis not present

## 2015-04-28 DIAGNOSIS — Z7982 Long term (current) use of aspirin: Secondary | ICD-10-CM | POA: Diagnosis not present

## 2015-04-28 DIAGNOSIS — E785 Hyperlipidemia, unspecified: Secondary | ICD-10-CM | POA: Diagnosis not present

## 2015-04-28 DIAGNOSIS — I120 Hypertensive chronic kidney disease with stage 5 chronic kidney disease or end stage renal disease: Secondary | ICD-10-CM | POA: Insufficient documentation

## 2015-04-28 DIAGNOSIS — Z992 Dependence on renal dialysis: Secondary | ICD-10-CM | POA: Diagnosis not present

## 2015-04-28 DIAGNOSIS — T82318A Breakdown (mechanical) of other vascular grafts, initial encounter: Secondary | ICD-10-CM | POA: Diagnosis not present

## 2015-04-28 HISTORY — PX: PERIPHERAL VASCULAR CATHETERIZATION: SHX172C

## 2015-04-28 LAB — POTASSIUM (ARMC VASCULAR LAB ONLY): Potassium (ARMC vascular lab): 4 (ref 3.5–5.1)

## 2015-04-28 SURGERY — A/V SHUNTOGRAM/FISTULAGRAM
Anesthesia: Moderate Sedation

## 2015-04-28 MED ORDER — FENTANYL CITRATE (PF) 100 MCG/2ML IJ SOLN
INTRAMUSCULAR | Status: DC | PRN
  Administered 2015-04-28 (×2): 50 ug via INTRAVENOUS

## 2015-04-28 MED ORDER — IOHEXOL 300 MG/ML  SOLN
INTRAMUSCULAR | Status: DC | PRN
  Administered 2015-04-28: 40 mL

## 2015-04-28 MED ORDER — MIDAZOLAM HCL 5 MG/5ML IJ SOLN
INTRAMUSCULAR | Status: AC
  Filled 2015-04-28: qty 5

## 2015-04-28 MED ORDER — HEPARIN (PORCINE) IN NACL 2-0.9 UNIT/ML-% IJ SOLN
INTRAMUSCULAR | Status: AC
  Filled 2015-04-28: qty 1000

## 2015-04-28 MED ORDER — HEPARIN SODIUM (PORCINE) 1000 UNIT/ML IJ SOLN
INTRAMUSCULAR | Status: DC | PRN
  Administered 2015-04-28: 3000 [IU] via INTRAVENOUS

## 2015-04-28 MED ORDER — HEPARIN SODIUM (PORCINE) 1000 UNIT/ML IJ SOLN
INTRAMUSCULAR | Status: AC
  Filled 2015-04-28: qty 1

## 2015-04-28 MED ORDER — LIDOCAINE-EPINEPHRINE (PF) 1 %-1:200000 IJ SOLN
INTRAMUSCULAR | Status: AC
  Filled 2015-04-28: qty 30

## 2015-04-28 MED ORDER — LIDOCAINE-EPINEPHRINE (PF) 1 %-1:200000 IJ SOLN
INTRAMUSCULAR | Status: DC | PRN
  Administered 2015-04-28: 10 mL via INTRADERMAL

## 2015-04-28 MED ORDER — DEXTROSE 5 % IV SOLN
1.5000 g | Freq: Once | INTRAVENOUS | Status: AC
  Administered 2015-04-28: 1.5 g via INTRAVENOUS

## 2015-04-28 MED ORDER — ONDANSETRON HCL 4 MG/2ML IJ SOLN: 4.0000 mg | Freq: Four times a day (QID) | INTRAMUSCULAR | Status: DC | PRN

## 2015-04-28 MED ORDER — MIDAZOLAM HCL 2 MG/2ML IJ SOLN
INTRAMUSCULAR | Status: DC | PRN
  Administered 2015-04-28: 1 mg via INTRAVENOUS
  Administered 2015-04-28: 2 mg via INTRAVENOUS

## 2015-04-28 MED ORDER — METHYLPREDNISOLONE SODIUM SUCC 125 MG IJ SOLR: 125.0000 mg | INTRAMUSCULAR | Status: DC | PRN

## 2015-04-28 MED ORDER — HYDROMORPHONE HCL 1 MG/ML IJ SOLN: 1.0000 mg | Freq: Once | INTRAMUSCULAR | Status: DC

## 2015-04-28 MED ORDER — SODIUM CHLORIDE 0.9 % IV SOLN: INTRAVENOUS | Status: DC

## 2015-04-28 MED ORDER — FENTANYL CITRATE (PF) 100 MCG/2ML IJ SOLN
INTRAMUSCULAR | Status: AC
  Filled 2015-04-28: qty 2

## 2015-04-28 MED ORDER — FAMOTIDINE 20 MG PO TABS: 40.0000 mg | ORAL_TABLET | ORAL | Status: DC | PRN

## 2015-04-28 SURGICAL SUPPLY — 16 items
BALLN DORADO7X100X80 (BALLOONS) ×4
BALLN ULTRVRSE 8X80X75 (BALLOONS) ×4
BALLOON DORADO7X100X80 (BALLOONS) IMPLANT
BALLOON ULTRVRSE 8X80X75 (BALLOONS) IMPLANT
CANNULA 5F STIFF (CANNULA) ×4 IMPLANT
DEVICE PRESTO INFLATION (MISCELLANEOUS) ×2 IMPLANT
DRAPE BRACHIAL (DRAPES) ×4 IMPLANT
PACK ANGIOGRAPHY (CUSTOM PROCEDURE TRAY) ×4 IMPLANT
SHEATH BRITE TIP 6FRX5.5 (SHEATH) ×4 IMPLANT
SHEATH BRITE TIP 7FRX5.5 (SHEATH) ×2 IMPLANT
STENT VIABAHN 8X2.5X120 (Permanent Stent) IMPLANT
STENT VIABAHN 8X25X120 (Permanent Stent) ×4 IMPLANT
STENT VIABAHN 8X7.5X120 (Permanent Stent) ×2 IMPLANT
TOWEL OR 17X26 4PK STRL BLUE (TOWEL DISPOSABLE) ×4 IMPLANT
WIRE G V18X300CM (WIRE) ×2 IMPLANT
WIRE MAGIC TORQUE 260C (WIRE) ×2 IMPLANT

## 2015-04-28 NOTE — Discharge Instructions (Signed)
Fistulogram, Care After °Refer to this sheet in the next few weeks. These instructions provide you with information on caring for yourself after your procedure. Your health care provider may also give you more specific instructions. Your treatment has been planned according to current medical practices, but problems sometimes occur. Call your health care provider if you have any problems or questions after your procedure. °WHAT TO EXPECT AFTER THE PROCEDURE °After your procedure, it is typical to have the following: °· A small amount of discomfort in the area where the catheters were placed. °· A small amount of bruising around the fistula. °· Sleepiness and fatigue. °HOME CARE INSTRUCTIONS °· Rest at home for the day following your procedure. °· Do not drive or operate heavy machinery while taking pain medicine. °· Take medicines only as directed by your health care provider. °· Do not take baths, swim, or use a hot tub until your health care provider approves. You may shower 24 hours after the procedure or as directed by your health care provider. °· There are many different ways to close and cover an incision, including stitches, skin glue, and adhesive strips. Follow your health care provider's instructions on: °¨ Incision care. °¨ Bandage (dressing) changes and removal. °¨ Incision closure removal. °· Monitor your dialysis fistula carefully. °SEEK MEDICAL CARE IF: °· You have drainage, redness, swelling, or pain at your catheter site. °· You have a fever. °· You have chills. °SEEK IMMEDIATE MEDICAL CARE IF: °· You feel weak. °· You have trouble balancing. °· You have trouble moving your arms or legs. °· You have problems with your speech or vision. °· You can no longer feel a vibration or buzz when you put your fingers over your dialysis fistula. °· The limb that was used for the procedure: °¨ Swells. °¨ Is painful. °¨ Is cold. °¨ Is discolored, such as blue or pale white. °  °This information is not intended  to replace advice given to you by your health care provider. Make sure you discuss any questions you have with your health care provider. °  °Document Released: 07/30/2013 Document Reviewed: 07/30/2013 °Elsevier Interactive Patient Education ©2016 Elsevier Inc. ° °

## 2015-04-28 NOTE — Op Note (Signed)
[facility]    OPERATIVE NOTE   PROCEDURE: 1.   Left brachiocephalic arteriovenous fistula cannulation under ultrasound guidance 2.   Left arm fistulagram including central venogram 3.   Percutaneous transluminal angioplasty of left proximal arm cephalic vein with 7 mm diameter high pressure angioplasty balloon 4.   Percutaneous transluminal angioplasty of cephalic vein subclavian vein confluence with 7 mm diameter high pressure and 82m diameter conventional angioplasty balloon 5.   Viabahn stent placement to the cephalic vein subclavian vein confluence with 8 mm diameter by 7.5 cm length stent for high-grade residual stenosis after angioplasty 6.   Viabahn stent placement to the cephalic vein in the proximal upper arm with 824mdiameter by 2.5 cm length stent for moderate residual stenosis after angioplasty  PRE-OPERATIVE DIAGNOSIS: 1. ESRD 2. Poorly functional left brachiocephalic AVF  POST-OPERATIVE DIAGNOSIS: same as above   SURGEON: JaLeotis PainMD  ANESTHESIA: local with MCS  ESTIMATED BLOOD LOSS: 25 cc  FINDING(S): 1. High-grade near occlusive stenosis in the proximal upper arm cephalic vein as well as a high-grade stenosis at the cephalic vein subclavian vein confluence  SPECIMEN(S):  None  CONTRAST: 40 cc  FLUORO TIME: 3.5 minutes  MODERATE CONSCIOUS SEDATION TIME: Approximately 30 minutes with 3 mg of Versed and 100 mcg of Fentanyl   INDICATIONS: NaANGELIS GATESs a [age].o. female who presents with malfunctioning  left brachiocephalic arteriovenous fistula.  The patient is scheduled for  left arm fistulagram.  The patient is aware the risks include but are not limited to: bleeding, infection, thrombosis of the cannulated access, and possible anaphylactic reaction to the contrast.  The patient is aware of the risks of the procedure and elects to proceed forward.  DESCRIPTION: After full informed written consent was obtained, the patient was brought  back to the angiography suite and placed supine upon the angiography table.  The patient was connected to monitoring equipment. Moderate conscious sedation was administered throughout the procedure with my supervision of the RN administering medicines and monitoring the patient's vital signs and mental status throughout from the start of the procedure until the patient was taken to the recovery room. The  left arm was prepped and draped in the standard fashion for a percutaneous access intervention.  Under ultrasound guidance, the  left brachiocephalic arteriovenous fistula was cannulated with a micropuncture needle under direct ultrasound guidance and a permanent image was performed.  The microwire was advanced into the fistula and the needle was exchanged for the a microsheath.  I then upsized to a 6 Fr Sheath and imaging was performed.  Hand injections were completed to image the access including the central venous system. This demonstrated about an 80% stenosis at the cephalic vein subclavian vein confluence. There was then several centimeters where the vein normalized but a separate stenosis that was nearly occlusive in the cephalic vein over several centimeters in the proximal upper arm was present as well.  Based on the images, this patient will need intervention to these areas. I then gave the patient 3000 units of intravenous heparin.  I then crossed the stenoses with a Magic Tourqe wire.  Based on the imaging, a 7 mm x 10 cm  high pressure angioplasty balloon was selected.  The balloon was centered around the proximal arm cephalic vein stenosis inflated to 14 ATM for 1 minute(s).  This was not long enough to get to the cephalic vein subclavian vein confluence and encompassed this lesion as well so  a second inflation was required for the separate and distinct more central stenosis. The balloon was advanced and again inflated to 14 atm for 1 minute. On completion imaging, a 60 % residual stenosis was  present in the proximal arm cephalic vein but a residual 80% stenosis with no improvement was seen at the cephalic vein subclavian vein confluence.   I then upsized to a 7 [place] sheath and exchanged for a 0.018 wire. The first stent encompass the cephalic vein subclavian vein confluence and was an 8 mm diameter by 7.5 cm length Viabahn covered stent. This was postdilated with an 8 mm balloon. For the cephalic vein stenosis in the proximal upper arm an additional stent was placed. An 8 mm diameter by 2.5 cm length Viabahn stent was deployed and then postdilated with a 7 mm balloon. Completion angiogram following this showed no greater than 15% residual stenosis.  Based on the completion imaging, no further intervention is necessary.  The wire and balloon were removed from the sheath.  A 4-0 Monocryl purse-string suture was sewn around the sheath.  The sheath was removed while tying down the suture.  A sterile bandage was applied to the puncture site.  COMPLICATIONS: None  CONDITION: Stable   Katharin Schneider  04/28/2015 10:55 AM

## 2015-04-28 NOTE — H&P (Signed)
  [facility] History & Physical Update  The patient was interviewed and re-examined.  The patient's previous History and Physical has been reviewed and is unchanged.  There is no change in the plan of care. We plan to proceed with the scheduled procedure.  DEW,JASON, MD  04/28/2015, 9:40 AM

## 2015-04-29 ENCOUNTER — Encounter: Payer: Self-pay | Admitting: Vascular Surgery

## 2015-04-29 DIAGNOSIS — E1129 Type 2 diabetes mellitus with other diabetic kidney complication: Secondary | ICD-10-CM | POA: Diagnosis not present

## 2015-04-29 DIAGNOSIS — Z992 Dependence on renal dialysis: Secondary | ICD-10-CM | POA: Diagnosis not present

## 2015-04-29 DIAGNOSIS — N186 End stage renal disease: Secondary | ICD-10-CM | POA: Diagnosis not present

## 2015-04-30 ENCOUNTER — Encounter: Payer: Self-pay | Admitting: Cardiology

## 2015-04-30 DIAGNOSIS — D508 Other iron deficiency anemias: Secondary | ICD-10-CM | POA: Diagnosis not present

## 2015-04-30 DIAGNOSIS — N2581 Secondary hyperparathyroidism of renal origin: Secondary | ICD-10-CM | POA: Diagnosis not present

## 2015-04-30 DIAGNOSIS — D509 Iron deficiency anemia, unspecified: Secondary | ICD-10-CM | POA: Diagnosis not present

## 2015-04-30 DIAGNOSIS — Z23 Encounter for immunization: Secondary | ICD-10-CM | POA: Diagnosis not present

## 2015-04-30 DIAGNOSIS — D631 Anemia in chronic kidney disease: Secondary | ICD-10-CM | POA: Diagnosis not present

## 2015-04-30 DIAGNOSIS — N186 End stage renal disease: Secondary | ICD-10-CM | POA: Diagnosis not present

## 2015-04-30 NOTE — Telephone Encounter (Signed)
This encounter was created in error - please disregard.

## 2015-04-30 NOTE — Telephone Encounter (Signed)
Returned patient's call. She states she did not want to speak to anyone except Hilda Blades, concerning recent tests and also a scheduled follow up appt.  She cannot make the appt in March - appt falls on a Monday and she does dialysis MWF. Pt aware I will inform Hilda Blades she wanted to speak w/ her.

## 2015-04-30 NOTE — Telephone Encounter (Signed)
New Message  Pt requested to speak w/ RN concerning upcomgin appt w DR Stanford Breed; Please call back and discuss.

## 2015-05-01 ENCOUNTER — Encounter: Payer: Self-pay | Admitting: Cardiology

## 2015-05-02 ENCOUNTER — Encounter: Payer: Self-pay | Admitting: Cardiology

## 2015-05-02 ENCOUNTER — Ambulatory Visit: Payer: Medicare Other | Admitting: Cardiology

## 2015-05-02 DIAGNOSIS — D509 Iron deficiency anemia, unspecified: Secondary | ICD-10-CM | POA: Diagnosis not present

## 2015-05-02 DIAGNOSIS — Z23 Encounter for immunization: Secondary | ICD-10-CM | POA: Diagnosis not present

## 2015-05-02 DIAGNOSIS — N2581 Secondary hyperparathyroidism of renal origin: Secondary | ICD-10-CM | POA: Diagnosis not present

## 2015-05-02 DIAGNOSIS — D631 Anemia in chronic kidney disease: Secondary | ICD-10-CM | POA: Diagnosis not present

## 2015-05-02 DIAGNOSIS — D508 Other iron deficiency anemias: Secondary | ICD-10-CM | POA: Diagnosis not present

## 2015-05-02 DIAGNOSIS — N186 End stage renal disease: Secondary | ICD-10-CM | POA: Diagnosis not present

## 2015-05-02 NOTE — Telephone Encounter (Signed)
New message      FYI Pt cannot keep appt today.  It is too late, it will be dark when she leaves and she cancelled the appt.

## 2015-05-05 ENCOUNTER — Encounter: Payer: Self-pay | Admitting: Cardiology

## 2015-05-05 DIAGNOSIS — Z23 Encounter for immunization: Secondary | ICD-10-CM | POA: Diagnosis not present

## 2015-05-05 DIAGNOSIS — D631 Anemia in chronic kidney disease: Secondary | ICD-10-CM | POA: Diagnosis not present

## 2015-05-05 DIAGNOSIS — D509 Iron deficiency anemia, unspecified: Secondary | ICD-10-CM | POA: Diagnosis not present

## 2015-05-05 DIAGNOSIS — N2581 Secondary hyperparathyroidism of renal origin: Secondary | ICD-10-CM | POA: Diagnosis not present

## 2015-05-05 DIAGNOSIS — N186 End stage renal disease: Secondary | ICD-10-CM | POA: Diagnosis not present

## 2015-05-05 DIAGNOSIS — D508 Other iron deficiency anemias: Secondary | ICD-10-CM | POA: Diagnosis not present

## 2015-05-05 NOTE — Telephone Encounter (Signed)
Follow up   Pt is calling for rn

## 2015-05-05 NOTE — Telephone Encounter (Signed)
This encounter was created in error - please disregard.

## 2015-05-06 ENCOUNTER — Telehealth: Payer: Self-pay | Admitting: Cardiology

## 2015-05-06 ENCOUNTER — Encounter: Payer: Self-pay | Admitting: Cardiology

## 2015-05-06 ENCOUNTER — Ambulatory Visit (INDEPENDENT_AMBULATORY_CARE_PROVIDER_SITE_OTHER): Payer: Medicare Other | Admitting: Cardiology

## 2015-05-06 ENCOUNTER — Other Ambulatory Visit: Payer: Self-pay | Admitting: Gastroenterology

## 2015-05-06 VITALS — BP 180/60 | HR 80 | Ht 59.0 in | Wt 115.2 lb

## 2015-05-06 DIAGNOSIS — I2510 Atherosclerotic heart disease of native coronary artery without angina pectoris: Secondary | ICD-10-CM

## 2015-05-06 DIAGNOSIS — I2583 Coronary atherosclerosis due to lipid rich plaque: Secondary | ICD-10-CM

## 2015-05-06 MED ORDER — ATORVASTATIN CALCIUM 80 MG PO TABS: 80.0000 mg | ORAL_TABLET | Freq: Every day | ORAL | Status: DC

## 2015-05-06 NOTE — Progress Notes (Signed)
HPI: FU bradycardia and CAD. She is dialysis dependent. Holter monitor in April of 2014 showed sinus rhythm to sinus bradycardia with PACs and PVCs. Patient admitted in June 2016 with GI bleed. She had dyspnea with ST depression noted on electrocardiogram. Troponin peaked at 1.34. Treated medically. Echocardiogram June 2016 showed ejection fraction of 50% with inferior lateral hypokinesis. There was mild mitral regurgitation, biatrial enlargement and mild right ventricular enlargement. Nuclear study repeated September 2016. Ejection fraction 47%. There was an anterior lateral perfusion defect consistent with prior infarct and peri-infarct ischemia. EGD showed vascular ectasia. Cardiac catheterization January 2017 showed a 95% distal right coronary lesion, 70% proximal to mid, 85% acute marginal, 99% ramus, 80% circumflex, 85% first marginal, 75% second diagonal and 80% mid LAD. PCI was felt to be high risk. Surgical consultation felt indicated. Since last seen she does have some dyspnea on exertion but no orthopnea, PND, pedal edema, chest pain, palpitations or syncope.  Current Outpatient Prescriptions  Medication Sig Dispense Refill  . acetaminophen (TYLENOL) 500 MG tablet Take 1,000 mg by mouth every 6 (six) hours as needed for moderate pain.    Marland Kitchen amLODipine (NORVASC) 2.5 MG tablet Take 2.5 mg by mouth at bedtime.    . Artificial Tear Ointment (REFRESH P.M. OP) Place 1 application into the right eye daily.    Marland Kitchen aspirin EC 81 MG tablet Take 1 tablet (81 mg total) by mouth daily. 90 tablet 3  . B Complex-C-Folic Acid (RENA-VITE PO) Take 1 tablet by mouth daily with breakfast.     . cholecalciferol (VITAMIN D) 1000 UNITS tablet Take 1,000 Units by mouth every Monday, Wednesday, and Friday.     . pantoprazole (PROTONIX) 40 MG tablet Take 1 tablet (40 mg total) by mouth 2 (two) times daily. (Patient taking differently: Take 40 mg by mouth daily. ) 30 tablet 5  . PRESCRIPTION MEDICATION [facility] Anemia  Therapy    . simvastatin (ZOCOR) 20 MG tablet Take 1 tablet (20 mg total) by mouth at bedtime. 30 tablet 6  . Calcium Carbonate Antacid (TUMS ULTRA 1000 PO) Take 2,000 mg by mouth 3 (three) times daily with meals.      No current facility-administered medications for this visit.     Past Medical History  Diagnosis Date  . Hyperlipidemia   . Hypertension   . Anxiety   . Facial droop 1935    acquired during forceps delivery, some residual R visual loss  . Hx of breast cancer 2004  . Arthritis     mild in lower back (Drs Posey Pronto and Joelyn Oms)  . GERD (gastroesophageal reflux disease)   . Bradycardia     a. Noted 06/2012.  Marland Kitchen Hypotension     a. Associated w/ dialysis.  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 [name])  . Osteoporosis 11/2013    DEXA -3.4 radius  . GAVE (gastric antral vascular ectasia) 08/2014    s/p EGD with APC  . Anemia in chronic kidney disease     IV iron infusion  . End stage renal disease on dialysis [facility])     HD M,W,F [place] AVF now LUE AVF  . Cancer [facility])     Left Breast 12 yrs. ago  . Hearing aid worn     Bilateral  . Sight impaired     Wears contact in Left Eye  . Cardiac murmur     a. thought due to AVF (2D  echo 06/2012 without significant valvular disease).   . Decreased peripheral vision of right eye     Past Surgical History  Procedure Laterality Date  . Cataract extraction  1981  . Av fistula placement  07/20/10    Right brachiocephalic AVF  . Umbilical hernia repair  2011  . Dexa  2013    [facility]  . US echocardiography  06/2012    LVH, EF 65%, grade 1 diastol dysfunction, mod dliated LAD  . Eye surgery  1966    regular cataract  . Breast lumpectomy Left   . Breast biopsy Left   . Bascilic vein transposition Left 07/24/2013    Procedure: BASILIC VEIN TRANSPOSITION;  Surgeon: [name], MD;  Location: [place];  Service: Vascular;  Laterality: Left;  . Shuntogram N/A 04/16/2011    Procedure:  Earney Mallet;  Surgeon: [name], MD;  Location: [facility] CATH LAB;  Service: Cardiovascular;  Laterality: N/A;  . Shuntogram Right 07/05/2013    Procedure: FISTULOGRAM;  Surgeon: [name], MD;  Location: [facility] CATH LAB;  Service: Cardiovascular;  Laterality: Right;  . Esophagogastroduodenoscopy N/A 04/09/2014    Procedure: ESOPHAGOGASTRODUODENOSCOPY (EGD);  Surgeon: [name], MD;  Location: [place];  Service: Endoscopy;  Laterality: N/A;  . Peripheral vascular catheterization N/A 08/15/2014    Procedure: A/V Shuntogram/Fistulagram;  Surgeon: [name], MD;  Location: [place] CV LAB;  Service: Cardiovascular;  Laterality: N/A;  . Esophagogastroduodenoscopy N/A 09/23/2014    Procedure: ESOPHAGOGASTRODUODENOSCOPY (EGD);  Surgeon: [name], MD;  Location: [place];  Service: Endoscopy;  Laterality: N/A;  . Hernia repair    . Esophagogastroduodenoscopy (egd) with propofol N/A 11/12/2014    Procedure: ESOPHAGOGASTRODUODENOSCOPY (EGD) WITH PROPOFOL;  Surgeon: [name], MD;  Location: [facility];  Service: Endoscopy;  Laterality: N/A;  . Hot hemostasis N/A 11/12/2014    Procedure: HOT HEMOSTASIS (ARGON PLASMA COAGULATION/BICAP);  Surgeon: [name], MD;  Location: [facility];  Service: Endoscopy;  Laterality: N/A;  . Esophagogastroduodenoscopy N/A 12/26/2014    neg H pylori, benign biopsies; ESOPHAGOGASTRODUODENOSCOPY (EGD);  Surgeon: [name], MD  . Cardiac catheterization N/A 04/10/2015    Procedure: Left Heart Cath and Coronary Angiography;  Surgeon: [name], MD;  Location: [place] CV LAB;  Service: Cardiovascular;  Laterality: N/A;  . Peripheral vascular catheterization N/A 04/28/2015    Procedure: A/V Shuntogram/Fistulagram;  Surgeon: [name], MD;  Location: [place] CV LAB;  Service: Cardiovascular;  Laterality: N/A;  . Peripheral vascular catheterization Left 04/28/2015    Procedure: A/V Shunt Intervention;  Surgeon: [name], MD;  Location: [place] CV LAB;  Service: Cardiovascular;  Laterality: Left;    Social History   Social History  . Marital Status: Single    Spouse Name: N/A  . Number of Children: N/A  . Years of Education: N/A   Occupational History  . 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 Smoking status: Former Smoker -- 2 years    Types: Cigarettes    Quit date: 03/29/1953  . Smokeless tobacco: Never Used  . Alcohol Use: 0.0 oz/week    0 Standard drinks or equivalent per week     Comment: occasional  . Drug Use: No  . Sexual Activity: Not on file   Other Topics Concern  . Not on file   Social History Narrative   As of 03/2013. Lives  alone with her small dog.  Bother and sister in law died last year.     Occupation: retired, was Information systems manager for [employer]   Edu: some college    Family History  Problem Relation Age of Onset  . Heart disease Brother     Congenital  . Hypertension Brother   . Diabetes Brother   . Cancer Father     stomach  . CAD Father     MI at age 83  . Heart disease Father   . Heart attack Father   . Cancer Sister     female (uterus?)  . Hypertension Mother     ROS: no fevers or chills, productive cough, hemoptysis, dysphasia, odynophagia, melena, hematochezia, dysuria, hematuria, rash, seizure activity, orthopnea, PND, pedal edema, claudication. Remaining systems are negative.  Physical Exam: Well-developed well-nourished in no acute distress.  Skin is warm and dry.  HEENT Right facial droop Neck is supple.  Chest is clear to auscultation with normal expansion.  Cardiovascular exam is regular rate and rhythm.  Abdominal exam nontender or distended. No masses palpated. Extremities show no edema. neuro grossly intact

## 2015-05-06 NOTE — Telephone Encounter (Signed)
Ok to continue zocor [employer]

## 2015-05-06 NOTE — Telephone Encounter (Signed)
Spoke with pt, she can not afford the generic medication. She does not want to take the lipitor, she would like to stay on the simvastatin.

## 2015-05-06 NOTE — Assessment & Plan Note (Signed)
Given documented coronary artery disease discontinue Zocor. Begin Lipitor 80 mg daily. Check lipids and liver in 4 weeks.

## 2015-05-06 NOTE — Assessment & Plan Note (Signed)
Continue aspirin and statin. Difficult situation. She has severe three-vessel coronary artery disease. Her anatomy is not ideal for PCI. Her risk for coronary artery bypass graft will be higher given her age, end-stage renal disease and history of GAVE syndrome (note no recent bleeding). However this appears to be the best long-term option. We will arrange consultation with CVTS for consideration of CABG. FU with me 6 weeks.

## 2015-05-06 NOTE — Patient Instructions (Signed)
Medication Instructions:   STOP SIMVASTATIN  START ATORVASTATIN 80 MG ONCE DAILY  Labwork:  Your physician recommends that you return for lab work in: 4 WEEKS= DO NOT EAT PRIOR TO LAB WORK  Follow-Up:  Your physician recommends that you schedule a follow-up appointment in: [place]

## 2015-05-06 NOTE — Telephone Encounter (Signed)
New Message  Pt c/o medication issue: 1. Name of Medication: Cholesterol medication was changed   4. What is your medication issue? It is too expensive so she would like to go back to what she was usually taking. Would like to continue with Simvastatin

## 2015-05-06 NOTE — Assessment & Plan Note (Signed)
Management per nephrology.

## 2015-05-06 NOTE — Assessment & Plan Note (Signed)
Blood pressure is elevated today. However on dialysis days she has problems with hypotension. Continue present dose of amlodipine and follow.

## 2015-05-07 DIAGNOSIS — D508 Other iron deficiency anemias: Secondary | ICD-10-CM | POA: Diagnosis not present

## 2015-05-07 DIAGNOSIS — D631 Anemia in chronic kidney disease: Secondary | ICD-10-CM | POA: Diagnosis not present

## 2015-05-07 DIAGNOSIS — N186 End stage renal disease: Secondary | ICD-10-CM | POA: Diagnosis not present

## 2015-05-07 DIAGNOSIS — N2581 Secondary hyperparathyroidism of renal origin: Secondary | ICD-10-CM | POA: Diagnosis not present

## 2015-05-07 DIAGNOSIS — M9903 Segmental and somatic dysfunction of lumbar region: Secondary | ICD-10-CM | POA: Diagnosis not present

## 2015-05-07 DIAGNOSIS — M955 Acquired deformity of pelvis: Secondary | ICD-10-CM | POA: Diagnosis not present

## 2015-05-07 DIAGNOSIS — D509 Iron deficiency anemia, unspecified: Secondary | ICD-10-CM | POA: Diagnosis not present

## 2015-05-07 DIAGNOSIS — M5136 Other intervertebral disc degeneration, lumbar region: Secondary | ICD-10-CM | POA: Diagnosis not present

## 2015-05-07 DIAGNOSIS — Z23 Encounter for immunization: Secondary | ICD-10-CM | POA: Diagnosis not present

## 2015-05-07 DIAGNOSIS — M9905 Segmental and somatic dysfunction of pelvic region: Secondary | ICD-10-CM | POA: Diagnosis not present

## 2015-05-08 ENCOUNTER — Other Ambulatory Visit: Payer: Self-pay | Admitting: Gastroenterology

## 2015-05-08 ENCOUNTER — Ambulatory Visit (INDEPENDENT_AMBULATORY_CARE_PROVIDER_SITE_OTHER): Payer: Medicare Other | Admitting: Family Medicine

## 2015-05-08 ENCOUNTER — Encounter: Payer: Self-pay | Admitting: Family Medicine

## 2015-05-08 ENCOUNTER — Telehealth: Payer: Self-pay | Admitting: Gastroenterology

## 2015-05-08 VITALS — BP 146/70 | HR 75 | Temp 97.5°F | Wt 113.0 lb

## 2015-05-08 DIAGNOSIS — I10 Essential (primary) hypertension: Secondary | ICD-10-CM

## 2015-05-08 DIAGNOSIS — M19049 Primary osteoarthritis, unspecified hand: Secondary | ICD-10-CM

## 2015-05-08 DIAGNOSIS — I2510 Atherosclerotic heart disease of native coronary artery without angina pectoris: Secondary | ICD-10-CM

## 2015-05-08 DIAGNOSIS — E785 Hyperlipidemia, unspecified: Secondary | ICD-10-CM

## 2015-05-08 MED ORDER — PANTOPRAZOLE SODIUM 40 MG PO TBEC: 40.0000 mg | DELAYED_RELEASE_TABLET | Freq: Two times a day (BID) | ORAL | Status: DC

## 2015-05-08 NOTE — Patient Instructions (Addendum)
I think this is wear and tear arthritis or osteoarthritis of hands.  Treat with tylenol 500-1000mg  twice daily as needed for discomfort.  You can also try some warm sitz soaks for symptomatic relief.  I think trial of lipitor is a good idea if you can find affordable option - look into goodRx website provided today. Good to see you today, call us with quesitons.  Return as needed or in 4-6 months for medicare wellness visit

## 2015-05-08 NOTE — Assessment & Plan Note (Addendum)
Chronic, stable today. No changes to amlodipine regimen. If persistently elevated, could consider 5mg  on non-dialysis days, 2.5mg  on dialysis days.

## 2015-05-08 NOTE — Assessment & Plan Note (Signed)
Anticipate sxs consistent with OA of hands. Discussed tylenol use, update if worsening or signs of active synvoitis

## 2015-05-08 NOTE — Telephone Encounter (Signed)
Left message of dr crenshaw's recommendations. 

## 2015-05-08 NOTE — Assessment & Plan Note (Signed)
Discussed lipitor. Pt hesitant mainly due to increased cost (zocor costs 1$/mo, atorvastatin would cost 25$. Discussed shopping around for cheaper price. [place] [facility].

## 2015-05-08 NOTE — Progress Notes (Signed)
BP 146/70 mmHg  Pulse 75  Temp(Src) 97.5 F (36.4 C) (Oral)  Wt 113 lb (51.256 kg)  SpO2 96%   CC: discuss hand pain  Subjective:    Patient ID: [name], female    DOB: 03-08-1932, 82 y.o.   MRN: [id number]  HPI: [name] is a 82 y.o. female presenting on 05/08/2015 for Hand Pain   Dialysis dependent ESRD. Recent blood pressures trending high but having trouble with hypotension after dialysis. No recent change to her antihypertensive regimen (amlodipine 2.5mg  daily).   CAD - zocor has been changed to lipitor 80mg  daily for more aggressive management. Known severe 3v disease, has been referred to CVTS for consideration of CABG.   Presents today with bilateral hand pain and paresthesias. Intermittent. Worse when she makes a fist. They get stiff. This happens every other week. No redness, swelling, warmth of joints. Has tried tylenol 500mg  PRN.   Relevant past medical, surgical, family and social history reviewed and updated as indicated. Interim medical history since our last visit reviewed. Allergies and medications reviewed and updated. Current Outpatient Prescriptions on File Prior to Visit  Medication Sig  . acetaminophen (TYLENOL) 500 MG tablet Take 1,000 mg by mouth every 6 (six) hours as needed for moderate pain.  Marland Kitchen amLODipine (NORVASC) 2.5 MG tablet Take 2.5 mg by mouth at bedtime.  . Artificial Tear Ointment (REFRESH P.M. OP) Place 1 application into the right eye daily.  Marland Kitchen aspirin EC 81 MG tablet Take 1 tablet (81 mg total) by mouth daily.  Marland Kitchen atorvastatin (LIPITOR) 80 MG tablet Take 1 tablet (80 mg total) by mouth daily.  . B Complex-C-Folic Acid (RENA-VITE PO) Take 1 tablet by mouth daily with breakfast.   . Calcium Carbonate Antacid (TUMS ULTRA 1000 PO) Take 2,000 mg by mouth 3 (three) times daily with meals.   . cholecalciferol (VITAMIN D) 1000 UNITS tablet Take 1,000 Units by mouth every Monday, Wednesday, and Friday.   . pantoprazole (PROTONIX) 40 MG tablet  Take 1 tablet (40 mg total) by mouth 2 (two) times daily. (Patient taking differently: Take 40 mg by mouth daily. )  . PRESCRIPTION MEDICATION [facility] Anemia Therapy   No current facility-administered medications on file prior to visit.    Review of Systems Per HPI unless specifically indicated in ROS section     Objective:    BP 146/70 mmHg  Pulse 75  Temp(Src) 97.5 F (36.4 C) (Oral)  Wt 113 lb (51.256 kg)  SpO2 96%  Wt Readings from Last 3 Encounters:  05/08/15 113 lb (51.256 kg)  05/06/15 115 lb 3 oz (52.249 kg)  04/28/15 111 lb (50.349 kg)    Physical Exam  Constitutional: She is oriented to person, place, and time. She appears well-developed and well-nourished. No distress.  HENT:  Chronic L facial droop  Musculoskeletal: Normal range of motion. She exhibits no edema.  Weak radial pulses bilaterally but good cap refill No pain to palpation at hands or joints today Deformity of 2nd DIPs present bilaterally, mild nodules present at few IPs bilaterally as well No active synovitis today - no warmth, swelling, redness  Neurological: She is alert and oriented to person, place, and time.  Neg tinel/phalen  Skin: Skin is warm and dry. No rash noted.  Psychiatric: She has a normal mood and affect.  Nursing note and vitals reviewed.     Assessment & Plan:   Problem List Items Addressed This Visit    Osteoarthritis, hand - Primary  Anticipate sxs consistent with OA of hands. Discussed tylenol use, update if worsening or signs of active synvoitis      Hypertension    Chronic, stable today. No changes to amlodipine regimen. If persistently elevated, could consider 5mg  on non-dialysis days, 2.5mg  on dialysis days.      Relevant Medications   simvastatin (ZOCOR) 20 MG tablet   Hyperlipidemia    Discussed lipitor. Pt hesitant mainly due to increased cost (zocor costs 1$/mo, atorvastatin would cost 25$. Discussed shopping around for cheaper price. [place] [facility] [place].       Relevant Medications   simvastatin (ZOCOR) 20 MG tablet       Follow up plan: Return in about 6 months (around 11/05/2015), or as needed, for medicare wellness visit.

## 2015-05-08 NOTE — Telephone Encounter (Signed)
Called patient to inform med sent  

## 2015-05-08 NOTE — Progress Notes (Signed)
Pre visit review using our clinic review tool, if applicable. No additional management support is needed unless otherwise documented below in the visit note. 

## 2015-05-09 DIAGNOSIS — D631 Anemia in chronic kidney disease: Secondary | ICD-10-CM | POA: Diagnosis not present

## 2015-05-09 DIAGNOSIS — D508 Other iron deficiency anemias: Secondary | ICD-10-CM | POA: Diagnosis not present

## 2015-05-09 DIAGNOSIS — N2581 Secondary hyperparathyroidism of renal origin: Secondary | ICD-10-CM | POA: Diagnosis not present

## 2015-05-09 DIAGNOSIS — D509 Iron deficiency anemia, unspecified: Secondary | ICD-10-CM | POA: Diagnosis not present

## 2015-05-09 DIAGNOSIS — Z23 Encounter for immunization: Secondary | ICD-10-CM | POA: Diagnosis not present

## 2015-05-09 DIAGNOSIS — N186 End stage renal disease: Secondary | ICD-10-CM | POA: Diagnosis not present

## 2015-05-12 ENCOUNTER — Encounter: Payer: Medicare Other | Admitting: Thoracic Surgery (Cardiothoracic Vascular Surgery)

## 2015-05-12 DIAGNOSIS — D508 Other iron deficiency anemias: Secondary | ICD-10-CM | POA: Diagnosis not present

## 2015-05-12 DIAGNOSIS — N186 End stage renal disease: Secondary | ICD-10-CM | POA: Diagnosis not present

## 2015-05-12 DIAGNOSIS — D631 Anemia in chronic kidney disease: Secondary | ICD-10-CM | POA: Diagnosis not present

## 2015-05-12 DIAGNOSIS — Z23 Encounter for immunization: Secondary | ICD-10-CM | POA: Diagnosis not present

## 2015-05-12 DIAGNOSIS — D509 Iron deficiency anemia, unspecified: Secondary | ICD-10-CM | POA: Diagnosis not present

## 2015-05-12 DIAGNOSIS — N2581 Secondary hyperparathyroidism of renal origin: Secondary | ICD-10-CM | POA: Diagnosis not present

## 2015-05-13 ENCOUNTER — Encounter: Payer: Medicare Other | Admitting: Thoracic Surgery (Cardiothoracic Vascular Surgery)

## 2015-05-13 ENCOUNTER — Institutional Professional Consult (permissible substitution) (INDEPENDENT_AMBULATORY_CARE_PROVIDER_SITE_OTHER): Payer: Medicare Other | Admitting: Thoracic Surgery (Cardiothoracic Vascular Surgery)

## 2015-05-13 ENCOUNTER — Encounter: Payer: Self-pay | Admitting: Thoracic Surgery (Cardiothoracic Vascular Surgery)

## 2015-05-13 ENCOUNTER — Other Ambulatory Visit: Payer: Self-pay | Admitting: *Deleted

## 2015-05-13 VITALS — BP 136/74 | HR 87 | Resp 16 | Ht 59.0 in | Wt 112.0 lb

## 2015-05-13 DIAGNOSIS — R011 Cardiac murmur, unspecified: Secondary | ICD-10-CM

## 2015-05-13 DIAGNOSIS — N186 End stage renal disease: Secondary | ICD-10-CM

## 2015-05-13 DIAGNOSIS — Z01818 Encounter for other preprocedural examination: Secondary | ICD-10-CM

## 2015-05-13 DIAGNOSIS — I2510 Atherosclerotic heart disease of native coronary artery without angina pectoris: Secondary | ICD-10-CM | POA: Diagnosis not present

## 2015-05-13 NOTE — Progress Notes (Signed)
PCP is [name], MD Referring Provider is Lelon Perla, MD  Chief Complaint  Patient presents with  . Coronary Artery Disease    eval for CABG...CATH 04/28/15    HPI: 82 year old woman sent for consideration for coronary artery bypass grafting.  [name] is an 82 year old woman with a past medical history significant for a lifelong facial droop, hypertension, hyperlipidemia, gastroesophageal reflux, osteoarthritis, heart murmur, anemia of chronic disease, gastrointestinal bleed secondary to GAVE, a non-ST elevation MI in the setting of an acute gastrointestinal bleed in June 2016 and end-stage renal disease on dialysis. She denies any history of diabetes although it is listed in her problem list.  In June 2016 she presented with a gastrointestinal bleed. Turned out to be secondary to GAVE. During that admission in the setting of anemia she complained of shortness of breath. She ruled in for a non-ST elevation MI. She had an echocardiogram which showed ejection fraction 47%. There was no significant valvular pathology. Her PA pressure was estimated to be 72 mmHg.  She was treated medically initially. A nuclear study was done in September which showed an anterolateral perfusion defect consistent with prior infarct and peri-infarct ischemia.  She had cardiac catheterization in January which showed severe three-vessel coronary disease not amenable to percutaneous intervention.  She complains of shortness of breath with relatively heavy exertion. She does not have any chest pain, pressure, or tightness. She is able to drive and do all of her own activities of daily living without assistance. Her boyfriend has a motorcycle and she rides on that with him. She gets dialyzed 3 times a week on Monday Wednesday and Friday. She does not have problems with swelling. She has been noted to have her blood pressure dropped while on dialysis.   Past Medical History  Diagnosis Date  .  Hyperlipidemia   . Hypertension   . Anxiety   . Facial droop 1935    acquired during forceps delivery, some residual R visual loss  . Hx of breast cancer 2004  . Arthritis     mild in lower back (Drs Posey Pronto and Joelyn Oms)  . GERD (gastroesophageal reflux disease)   . Bradycardia     a. Noted 06/2012.  Marland Kitchen Hypotension     a. Associated w/ dialysis.  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 [name])  . Osteoporosis 11/2013    DEXA -3.4 radius  . GAVE (gastric antral vascular ectasia) 08/2014    s/p EGD with APC  . Anemia in chronic kidney disease     IV iron infusion  . End stage renal disease on dialysis [facility])     HD M,W,F [place] AVF now LUE AVF  . Cancer [facility])     Left Breast 12 yrs. ago  . Hearing aid worn     Bilateral  . Sight impaired     Wears contact in Left Eye  . Cardiac murmur     a. thought due to AVF (2D echo 06/2012 without significant valvular disease).   . Decreased peripheral vision of right eye   . CAD (coronary artery disease), native coronary artery     3 vessel CAD by cath 04/2015    Past Surgical History  Procedure Laterality Date  . Cataract extraction  1981  . Av fistula placement  07/20/10    Right brachiocephalic AVF  . Umbilical hernia repair  2011  . Dexa  2013    [facility]  .  US echocardiography  06/2012    LVH, EF 65%, grade 1 diastol dysfunction, mod dliated LAD  . Eye surgery  1966    regular cataract  . Breast lumpectomy Left   . Breast biopsy Left   . Bascilic vein transposition Left 07/24/2013    Procedure: BASILIC VEIN TRANSPOSITION;  Surgeon: [name], MD;  Location: [place];  Service: Vascular;  Laterality: Left;  . Shuntogram N/A 04/16/2011    Procedure: [place];  Surgeon: [name], MD;  Location: [facility] CATH LAB;  Service: Cardiovascular;  Laterality: N/A;  . Shuntogram Right 07/05/2013    Procedure: FISTULOGRAM;  Surgeon: [name], MD;  Location: [facility] CATH LAB;  Service:  Cardiovascular;  Laterality: Right;  . Esophagogastroduodenoscopy N/A 04/09/2014    Procedure: ESOPHAGOGASTRODUODENOSCOPY (EGD);  Surgeon: [name], MD;  Location: [place];  Service: Endoscopy;  Laterality: N/A;  . Peripheral vascular catheterization N/A 08/15/2014    Procedure: A/V Shuntogram/Fistulagram;  Surgeon: [name], MD;  Location: [place] CV LAB;  Service: Cardiovascular;  Laterality: N/A;  . Esophagogastroduodenoscopy N/A 09/23/2014    Procedure: ESOPHAGOGASTRODUODENOSCOPY (EGD);  Surgeon: [name], MD;  Location: [place];  Service: Endoscopy;  Laterality: N/A;  . Hernia repair    . Esophagogastroduodenoscopy (egd) with propofol N/A 11/12/2014    Procedure: ESOPHAGOGASTRODUODENOSCOPY (EGD) WITH PROPOFOL;  Surgeon: [name], MD;  Location: [facility]COPY;  Service: Endoscopy;  Laterality: N/A;  . Hot hemostasis N/A 11/12/2014    Procedure: HOT HEMOSTASIS (ARGON PLASMA COAGULATION/BICAP);  Surgeon: [name], MD;  Location: [facility];  Service: Endoscopy;  Laterality: N/A;  . Esophagogastroduodenoscopy N/A 12/26/2014    neg H pylori, benign biopsies; ESOPHAGOGASTRODUODENOSCOPY (EGD);  Surgeon: [name], MD  . Cardiac catheterization N/A 04/10/2015    Procedure: Left Heart Cath and Coronary Angiography;  Surgeon: [name], MD;  Location: [place] CV LAB;  Service: Cardiovascular;  Laterality: N/A;  . Peripheral vascular catheterization N/A 04/28/2015    Procedure: A/V Shuntogram/Fistulagram;  Surgeon: [name], MD;  Location: [place] CV LAB;  Service: Cardiovascular;  Laterality: N/A;  . Peripheral vascular catheterization Left 04/28/2015    Procedure: A/V Shunt Intervention;  Surgeon: [name], MD;  Location: [place] CV LAB;  Service: Cardiovascular;  Laterality: Left;    Family History  Problem Relation Age of Onset  . Heart disease Brother     Congenital  . Hypertension Brother   . Diabetes Brother   .  Cancer Father     stomach  . CAD Father     MI at age 30  . Heart disease Father   . Heart attack Father   . Cancer Sister     female (uterus?)  . Hypertension Mother     Social History Social History  Substance Use Topics  . Smoking status: Former Smoker -- 2 years    Types: Cigarettes    Quit date: 03/29/1953  . Smokeless tobacco: Never Used  . Alcohol Use: 0.0 oz/week    0 Standard drinks or equivalent per week     Comment: occasional    Current Outpatient Prescriptions  Medication Sig Dispense Refill  . acetaminophen (TYLENOL) 500 MG tablet Take 1,000 mg by mouth every 6 (six) hours as needed for moderate pain.    Marland Kitchen amLODipine (NORVASC) 2.5 MG tablet Take 2.5 mg by mouth at bedtime.    . Artificial Tear Ointment (REFRESH P.M. OP) Place 1 application into the right  eye daily.    Marland Kitchen aspirin EC 81 MG tablet Take 1 tablet (81 mg total) by mouth daily. 90 tablet 3  . atorvastatin (LIPITOR) 80 MG tablet Take 1 tablet (80 mg total) by mouth daily. 90 tablet 3  . B Complex-C-Folic Acid (RENA-VITE PO) Take 1 tablet by mouth daily with breakfast.     . Calcium Carbonate Antacid (TUMS ULTRA 1000 PO) Take 2,000 mg by mouth 3 (three) times daily with meals.     . pantoprazole (PROTONIX) 40 MG tablet Take 1 tablet (40 mg total) by mouth 2 (two) times daily. 60 tablet 5  . PRESCRIPTION MEDICATION [facility] Anemia Therapy    . cholecalciferol (VITAMIN D) 1000 UNITS tablet Take 2,000 Units by mouth every Monday, Wednesday, and Friday.      No current facility-administered medications for this visit.    Allergies  Allergen Reactions  . Sulfa Antibiotics Rash    Review of Systems  Constitutional: Negative for fever, activity change, appetite change and unexpected weight change.  HENT: Positive for hearing loss. Negative for trouble swallowing and voice change.   Eyes: Negative for visual disturbance.  Respiratory: Positive for shortness of breath (with heavy exertion, more noticeable when  anemic). Negative for chest tightness and wheezing.   Cardiovascular: Negative for chest pain and leg swelling.  Gastrointestinal: Positive for abdominal pain (reflux) and blood in stool (GIB in June 2016).  Genitourinary: Negative for dysuria and hematuria.  Musculoskeletal: Positive for back pain and arthralgias.  Neurological: Positive for weakness (right facial droop since birth). Negative for headaches.  Hematological: Negative for adenopathy. Bruises/bleeds easily.  All other systems reviewed and are negative.   BP 136/74 mmHg  Pulse 87  Resp 16  Ht 4\' 11"  (1.499 m)  Wt 112 lb (50.803 kg)  BMI 22.61 kg/m2  SpO2 96% Physical Exam  Constitutional: She is oriented to person, place, and time. No distress.  HENT:  Head: Normocephalic and atraumatic.  Eyes: EOM are normal. Pupils are equal, round, and reactive to light.  Neck: Neck supple. No thyromegaly present.  No carotid bruits  Cardiovascular: Normal rate and regular rhythm.   Murmur (2/6 systolic) heard. Pulmonary/Chest: Effort normal and breath sounds normal. No respiratory distress. She has no wheezes. She has no rales.  Abdominal: Soft. She exhibits no distension. There is no tenderness.  Musculoskeletal: She exhibits no edema.  Lymphadenopathy:    She has no cervical adenopathy.  Neurological: She is alert and oriented to person, place, and time.  Right facial droop, motor otherwise intact  Skin: Skin is warm and dry.  hemosiderosis  Vitals reviewed.    Diagnostic Tests: Echocardiogram June 2016 Study Conclusions  - Left ventricle: Mild inferolateral hypokinesis. The cavity size was normal. Wall thickness was increased in a pattern of mild LVH. The estimated ejection fraction was 50%. Doppler parameters are consistent with high ventricular filling pressure. - Aortic valve: Sclerosis without stenosis. There was no significant regurgitation. - Mitral valve: Calcified annulus. There was mild  regurgitation. - Left atrium: The atrium was moderately to severely dilated. - Right ventricle: The cavity size was mildly dilated. Systolic function was normal. - Right atrium: The atrium was moderately dilated. - Pulmonary arteries: PA peak pressure: 72 mm Hg (S).  Cardiac catheterization January 2017 Conclusion    1. Dist RCA lesion, 95% stenosed. 2. Post Atrio lesion, 95% stenosed. 3. Prox RCA to Mid RCA lesion, 70% stenosed. 4. Acute Mrg lesion, 85% stenosed. 5. Ost Ramus to Ramus lesion, 99%  stenosed. 6. Prox Cx to Mid Cx lesion, 80% stenosed. 7. Ost 1st Mrg to 1st Mrg lesion, 85% stenosed. 8. Ost 2nd Diag to 2nd Diag lesion, 75% stenosed. 9. Mid LAD to Dist LAD lesion, 80% stenosed.   Severe three-vessel coronary disease involving the mid LAD, mid to distal RCA, and mid circumflex. Distal vessels are Large enough to graft.  Normal left ventricular end-diastolic pressure.  Noninvasive EF of 50% in Mid to late 2016.  Successful Angio-Seal used for hemostasis.   RECOMMENDATIONS:   Consider surgical consultation to determine if the patient is a bypass candidate. End stage renal disease and other co-morbidities increase risk of surgery and may disqualify her as a candidate.  Will depend upon Dr. Stanford Breed to make final decision about whether surgical referral his needed.  PCI would be complicated , involve multiple stents, and require long-term dual antiplatelet therapy. Therefore, this treatment would seem to be less desirable in absence of ACS/ refractory symptoms.    I personally reviewed the echocardiogram from June and the catheterization films from January concur with the findings as noted above.  Impression: 82 year old woman with multiple medical problems who has severe three-vessel coronary disease, but has only very mild symptoms. From an anatomical standpoint coronary bypass grafting is indicated. She does have adequate target vessels. However, she is  an extremely high-risk patient given her advanced age, small size, probable pulmonary hypertension and dialysis-dependent renal failure. Her risk borders on the prohibitive. She certainly is at very high risk for complications which could be life altering.  I had a long discussion with [name], who was accompanied by a friend of hers. I discussed the general nature of coronary bypass grafting. We spent a good deal of time talking about the risks of the procedure. I informed her in frank terms that she would be a very high-risk patient and that she might not return to her previous level of functioning after an operation of this magnitude. I would estimate her risk of dying to be around 10% and her risk of stroke to be in the 5-10% range. I think she has greater than 25% risk of life altering complications and that could approach 50%. She is relatively asymptomatic except when she is anemic. She says now that her hemoglobin is in the 10 range she is able to do pretty much everything she wants to do. I don't think she's going to see any symptomatic benefit from the procedure, it would strictly be for survival benefit.  We did discuss potential options other than coronary bypass grafting including medical therapy with or without targeted angioplasty. There certainly is no way to completely revascularize her with angioplasty, but one option would be to angioplasty the LAD and treat the other vessels medically.  She had a lot of questions which were appropriate. She is uncertain as to how she would like to proceed.  I recommended that we go ahead and get carotid Dopplers and pulmonary function tests on her, while she is considering her options. I will meet with her again in 2 weeks to further discuss these issues with her.  Plan: Carotid duplex  Pulmonary function testing  Return in 2 weeks  Melrose Nakayama, MD [facility] [phone]

## 2015-05-14 DIAGNOSIS — D508 Other iron deficiency anemias: Secondary | ICD-10-CM | POA: Diagnosis not present

## 2015-05-14 DIAGNOSIS — D509 Iron deficiency anemia, unspecified: Secondary | ICD-10-CM | POA: Diagnosis not present

## 2015-05-14 DIAGNOSIS — N186 End stage renal disease: Secondary | ICD-10-CM | POA: Diagnosis not present

## 2015-05-14 DIAGNOSIS — Z23 Encounter for immunization: Secondary | ICD-10-CM | POA: Diagnosis not present

## 2015-05-14 DIAGNOSIS — N2581 Secondary hyperparathyroidism of renal origin: Secondary | ICD-10-CM | POA: Diagnosis not present

## 2015-05-14 DIAGNOSIS — D631 Anemia in chronic kidney disease: Secondary | ICD-10-CM | POA: Diagnosis not present

## 2015-05-15 ENCOUNTER — Ambulatory Visit (HOSPITAL_COMMUNITY)
Admission: RE | Admit: 2015-05-15 | Discharge: 2015-05-15 | Disposition: A | Discharge status: Home / Self Care | Payer: Medicare Other | Source: Ambulatory Visit | Attending: Thoracic Surgery (Cardiothoracic Vascular Surgery) | Admitting: Thoracic Surgery (Cardiothoracic Vascular Surgery)

## 2015-05-15 DIAGNOSIS — Z01818 Encounter for other preprocedural examination: Secondary | ICD-10-CM | POA: Diagnosis not present

## 2015-05-15 DIAGNOSIS — R942 Abnormal results of pulmonary function studies: Secondary | ICD-10-CM | POA: Insufficient documentation

## 2015-05-15 DIAGNOSIS — I6529 Occlusion and stenosis of unspecified carotid artery: Secondary | ICD-10-CM | POA: Diagnosis not present

## 2015-05-15 DIAGNOSIS — R011 Cardiac murmur, unspecified: Secondary | ICD-10-CM | POA: Diagnosis not present

## 2015-05-15 LAB — PULMONARY FUNCTION TEST
DL/VA % PRED: 83 %
DL/VA: 3.42 ml/min/mmHg/L
DLCO unc % pred: 47 %
DLCO unc: 8.32 ml/min/mmHg
FEF 25-75 PRE: 0.83 L/s
FEF2575-%PRED-PRE: 78 %
FEV1-%Pred-Pre: 72 %
FEV1-PRE: 1.05 L
FEV1FVC-%Pred-Pre: 102 %
FEV6-%PRED-PRE: 73 %
FEV6-PRE: 1.36 L
FEV6FVC-%PRED-PRE: 104 %
FVC-%Pred-Pre: 70 %
FVC-Pre: 1.38 L
Pre FEV1/FVC ratio: 76 %
Pre FEV6/FVC Ratio: 98 %
RV % PRED: 343 %
RV: 7.45 L
TLC % pred: 204 %
TLC: 8.78 L

## 2015-05-16 ENCOUNTER — Telehealth: Payer: Self-pay | Admitting: Cardiology

## 2015-05-16 DIAGNOSIS — D508 Other iron deficiency anemias: Secondary | ICD-10-CM | POA: Diagnosis not present

## 2015-05-16 DIAGNOSIS — N186 End stage renal disease: Secondary | ICD-10-CM | POA: Diagnosis not present

## 2015-05-16 DIAGNOSIS — N2581 Secondary hyperparathyroidism of renal origin: Secondary | ICD-10-CM | POA: Diagnosis not present

## 2015-05-16 DIAGNOSIS — D509 Iron deficiency anemia, unspecified: Secondary | ICD-10-CM | POA: Diagnosis not present

## 2015-05-16 DIAGNOSIS — Z23 Encounter for immunization: Secondary | ICD-10-CM | POA: Diagnosis not present

## 2015-05-16 DIAGNOSIS — D631 Anemia in chronic kidney disease: Secondary | ICD-10-CM | POA: Diagnosis not present

## 2015-05-16 NOTE — Telephone Encounter (Signed)
Spoke with pt, she gives [place] permission to speak to [place]. The pt is doing fine and questions regarding recent testing answered. Will cont to try to reach [place]

## 2015-05-16 NOTE — Telephone Encounter (Signed)
Returning your call. °

## 2015-05-16 NOTE — Telephone Encounter (Addendum)
Spoke with operator at [facility], she referred me to the dialysis center where the pt is seen. Called [phone], no answer Left message for pt to call

## 2015-05-16 NOTE — Telephone Encounter (Signed)
Velva Harman is a PA at [facility]. She wanted to see if Dr. Stanford Breed could assist or give some recommendations for the Coronay Artery Bypass she was sent to Dr. Roxan Hockey for. Velva Harman was not clear as far as what intentions were. She wanted to see if the pt could be seen sooner than 3/21. I informed her that Dr. Stanford Breed did not have an openings but I could put her with an APP on a day Dr. Stanford Breed would be in the office, she said that she could not make that call as far as if that would suffice or not. She became frustrated and repeated the pt's situation again and stated that I was not being helpful and then terminated the call.   Ultimately Dr. Stanford Breed may need to speak with Dr. Roxan Hockey and see what the best plan of action should be moving forward with her Cabag  Thanks

## 2015-05-19 DIAGNOSIS — N186 End stage renal disease: Secondary | ICD-10-CM | POA: Diagnosis not present

## 2015-05-19 DIAGNOSIS — Z23 Encounter for immunization: Secondary | ICD-10-CM | POA: Diagnosis not present

## 2015-05-19 DIAGNOSIS — N2581 Secondary hyperparathyroidism of renal origin: Secondary | ICD-10-CM | POA: Diagnosis not present

## 2015-05-19 DIAGNOSIS — D508 Other iron deficiency anemias: Secondary | ICD-10-CM | POA: Diagnosis not present

## 2015-05-19 DIAGNOSIS — D509 Iron deficiency anemia, unspecified: Secondary | ICD-10-CM | POA: Diagnosis not present

## 2015-05-19 DIAGNOSIS — D631 Anemia in chronic kidney disease: Secondary | ICD-10-CM | POA: Diagnosis not present

## 2015-05-19 NOTE — Telephone Encounter (Signed)
Left message for [name] to return my call at the dialysis center.

## 2015-05-20 NOTE — Telephone Encounter (Signed)
Returning Debra call from yesterday. °

## 2015-05-20 NOTE — Telephone Encounter (Signed)
Returned call to Jill Fairly PA with [facility] calling to see if Dr.Crenshaw could see patient sooner than 06/17/15.Stated she has appointment with Dr.Hendrickson 05/27/15.Patient has dialysis on Mon-Wed-Fri,needs a Tue or Thurs appointment.Advised Dr.Crenshaw's schedule is full.Hilda Blades his nurse is out of office today.I will send message to her.

## 2015-05-21 DIAGNOSIS — M9903 Segmental and somatic dysfunction of lumbar region: Secondary | ICD-10-CM | POA: Diagnosis not present

## 2015-05-21 DIAGNOSIS — N2581 Secondary hyperparathyroidism of renal origin: Secondary | ICD-10-CM | POA: Diagnosis not present

## 2015-05-21 DIAGNOSIS — M955 Acquired deformity of pelvis: Secondary | ICD-10-CM | POA: Diagnosis not present

## 2015-05-21 DIAGNOSIS — M5136 Other intervertebral disc degeneration, lumbar region: Secondary | ICD-10-CM | POA: Diagnosis not present

## 2015-05-21 DIAGNOSIS — Z23 Encounter for immunization: Secondary | ICD-10-CM | POA: Diagnosis not present

## 2015-05-21 DIAGNOSIS — D508 Other iron deficiency anemias: Secondary | ICD-10-CM | POA: Diagnosis not present

## 2015-05-21 DIAGNOSIS — D509 Iron deficiency anemia, unspecified: Secondary | ICD-10-CM | POA: Diagnosis not present

## 2015-05-21 DIAGNOSIS — D631 Anemia in chronic kidney disease: Secondary | ICD-10-CM | POA: Diagnosis not present

## 2015-05-21 DIAGNOSIS — N186 End stage renal disease: Secondary | ICD-10-CM | POA: Diagnosis not present

## 2015-05-21 DIAGNOSIS — M9905 Segmental and somatic dysfunction of pelvic region: Secondary | ICD-10-CM | POA: Diagnosis not present

## 2015-05-21 NOTE — Telephone Encounter (Signed)
Spoke with [employer], questions regarding pt and upcoming appts answered.

## 2015-05-22 ENCOUNTER — Telehealth: Payer: Self-pay | Admitting: Cardiology

## 2015-05-22 DIAGNOSIS — E785 Hyperlipidemia, unspecified: Secondary | ICD-10-CM

## 2015-05-22 NOTE — Telephone Encounter (Signed)
New message    Pt wants to speak to rn about lab orders

## 2015-05-22 NOTE — Telephone Encounter (Signed)
Spoke with pt, she needs her lab orders for [facility]. Lab orders mailed to the pt

## 2015-05-23 DIAGNOSIS — N186 End stage renal disease: Secondary | ICD-10-CM | POA: Diagnosis not present

## 2015-05-23 DIAGNOSIS — D509 Iron deficiency anemia, unspecified: Secondary | ICD-10-CM | POA: Diagnosis not present

## 2015-05-23 DIAGNOSIS — D508 Other iron deficiency anemias: Secondary | ICD-10-CM | POA: Diagnosis not present

## 2015-05-23 DIAGNOSIS — N2581 Secondary hyperparathyroidism of renal origin: Secondary | ICD-10-CM | POA: Diagnosis not present

## 2015-05-23 DIAGNOSIS — D631 Anemia in chronic kidney disease: Secondary | ICD-10-CM | POA: Diagnosis not present

## 2015-05-23 DIAGNOSIS — Z23 Encounter for immunization: Secondary | ICD-10-CM | POA: Diagnosis not present

## 2015-05-26 DIAGNOSIS — Z23 Encounter for immunization: Secondary | ICD-10-CM | POA: Diagnosis not present

## 2015-05-26 DIAGNOSIS — N2581 Secondary hyperparathyroidism of renal origin: Secondary | ICD-10-CM | POA: Diagnosis not present

## 2015-05-26 DIAGNOSIS — D509 Iron deficiency anemia, unspecified: Secondary | ICD-10-CM | POA: Diagnosis not present

## 2015-05-26 DIAGNOSIS — D631 Anemia in chronic kidney disease: Secondary | ICD-10-CM | POA: Diagnosis not present

## 2015-05-26 DIAGNOSIS — N186 End stage renal disease: Secondary | ICD-10-CM | POA: Diagnosis not present

## 2015-05-26 DIAGNOSIS — D508 Other iron deficiency anemias: Secondary | ICD-10-CM | POA: Diagnosis not present

## 2015-05-27 ENCOUNTER — Ambulatory Visit (INDEPENDENT_AMBULATORY_CARE_PROVIDER_SITE_OTHER): Payer: Medicare Other | Admitting: Thoracic Surgery (Cardiothoracic Vascular Surgery)

## 2015-05-27 ENCOUNTER — Encounter: Payer: Self-pay | Admitting: Thoracic Surgery (Cardiothoracic Vascular Surgery)

## 2015-05-27 VITALS — BP 133/66 | HR 92 | Resp 20 | Ht 59.0 in | Wt 112.0 lb

## 2015-05-27 DIAGNOSIS — Z992 Dependence on renal dialysis: Secondary | ICD-10-CM | POA: Diagnosis not present

## 2015-05-27 DIAGNOSIS — N186 End stage renal disease: Secondary | ICD-10-CM

## 2015-05-27 DIAGNOSIS — I2510 Atherosclerotic heart disease of native coronary artery without angina pectoris: Secondary | ICD-10-CM

## 2015-05-27 DIAGNOSIS — E1129 Type 2 diabetes mellitus with other diabetic kidney complication: Secondary | ICD-10-CM | POA: Diagnosis not present

## 2015-05-27 NOTE — Progress Notes (Signed)
[address]       [place] [address]             [phone]       HPI: [name] returns today for scheduled follow-up appointment.  [name] is an 82 year old woman with a past medical history significant for a lifelong facial droop, hypertension, hyperlipidemia, gastroesophageal reflux, osteoarthritis, heart murmur, anemia of chronic disease, gastrointestinal bleed secondary to GAVE, a non-ST elevation MI in the setting of an acute gastrointestinal bleed in June 2016 and end-stage renal disease on dialysis. She denies any history of diabetes although it is listed in her problem list.  In June 2016 she presented with a gastrointestinal bleed. Turned out to be secondary to GAVE. During that admission in the setting of anemia she complained of shortness of breath. She ruled in for a non-ST elevation MI. She had an echocardiogram which showed ejection fraction 47%. There was no significant valvular pathology. Her PA pressure was estimated to be 72 mmHg.  She was treated medically initially. A nuclear study was done in September which showed an anterolateral perfusion defect consistent with prior infarct and peri-infarct ischemia.  She had cardiac catheterization in January which showed severe three-vessel coronary disease not amenable to percutaneous intervention.  Since her last visit she's had difficulty with a cough, nonproductive. She also had some wheezing over the weekend, but says that has resolved. She denies any fevers or chills. She says that her hemoglobin dropped from 11.2 to 9.2 over the course of a couple weeks and she is concerned that she may have some gastrointestinal bleeding. She has an appointment with our GI suite.  Past Medical History  Diagnosis Date  . Hyperlipidemia   . Hypertension   . Anxiety   . Facial droop 1935    acquired during forceps delivery, some residual R visual loss  . Hx of breast cancer 2004  . Arthritis     mild in lower  back (Drs Posey Pronto and Joelyn Oms)  . GERD (gastroesophageal reflux disease)   . Bradycardia     a. Noted 06/2012.  Marland Kitchen Hypotension     a. Associated w/ dialysis.  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 [name])  . Osteoporosis 11/2013    DEXA -3.4 radius  . GAVE (gastric antral vascular ectasia) 08/2014    s/p EGD with APC  . Anemia in chronic kidney disease     IV iron infusion  . End stage renal disease on dialysis [facility])     HD M,W,F [place] AVF now LUE AVF  . Cancer [facility])     Left Breast 12 yrs. ago  . Hearing aid worn     Bilateral  . Sight impaired     Wears contact in Left Eye  . Cardiac murmur     a. thought due to AVF (2D echo 06/2012 without significant valvular disease).   . Decreased peripheral vision of right eye   . CAD (coronary artery disease), native coronary artery     3 vessel CAD by cath 04/2015      Current Outpatient Prescriptions  Medication Sig Dispense Refill  . acetaminophen (TYLENOL) 500 MG tablet Take 1,000 mg by mouth every 6 (six) hours as needed for moderate pain.    Marland Kitchen amLODipine (NORVASC) 2.5 MG tablet Take 2.5 mg by mouth at bedtime.    . Artificial Tear Ointment (REFRESH P.M. OP) Place 1 application  into the right eye daily.    Marland Kitchen aspirin EC 81 MG tablet Take 1 tablet (81 mg total) by mouth daily. 90 tablet 3  . atorvastatin (LIPITOR) 80 MG tablet Take 1 tablet (80 mg total) by mouth daily. 90 tablet 3  . B Complex-C-Folic Acid (RENA-VITE PO) Take 1 tablet by mouth daily with breakfast.     . Calcium Carbonate Antacid (TUMS ULTRA 1000 PO) Take 2,000 mg by mouth 3 (three) times daily with meals.     . cholecalciferol (VITAMIN D) 1000 UNITS tablet Take 2,000 Units by mouth every Monday, Wednesday, and Friday.     . pantoprazole (PROTONIX) 40 MG tablet Take 1 tablet (40 mg total) by mouth 2 (two) times daily. 60 tablet 5  . PRESCRIPTION MEDICATION [facility] Anemia Therapy     No current facility-administered  medications for this visit.    Physical Exam BP 133/66 mmHg  Pulse 92  Resp 20  Ht 4\' 11"  (1.499 m)  Wt 112 lb (50.803 kg)  BMI 22.61 kg/m2  SpO42 37% 82 year old woman in no acute distress Right facial droop Cardiac regular rate and rhythm with a 2 to 3/6 systolic murmur Lungs with rhonchi and wheezing on the left, right clear  Diagnostic Tests: Pulmonary function testing FVC 1.38 (70%) FEV1 1.05 (72%) DLCO 8.32 (47%)  Carotid duplex revealed 1-39% stenosis bilaterally  Impression: 82 year old woman with multiple significant medical problems including hypertension, hyperlipidemia, type 2 diabetes, gastroesophageal reflux, GAVE, end-stage renal disease requiring hemodialysis, and three-vessel coronary disease.  She would be an extremely high risk patient for coronary bypass grafting due to age end-stage renal disease and pulmonary hypertension among other issues. She is doing well from a symptomatic standpoint when her hemoglobin is at a reasonable level. Her life expectancy is limited given her age and end-stage renal disease, so I think the primary indication for myocardial revascularization in her case would be for symptom relief. Given that her symptoms are under control with medical therapy, I would not recommend coronary bypass grafting at this time.  She says that she had time to think about possible coronary bypass grafting. She talked to multiple people and has decided that she does not want to have surgery due to the risk of complications.  Plan:  She will follow-up with Dr. Stanford Breed.  She has no appointment with [facility] next week  I would be happy to meet with her again in the future if I can be of any assistance.  I spent 10 minutes with Jill. Fickett during this visit.  Melrose Nakayama, MD [facility] [phone]

## 2015-05-28 DIAGNOSIS — N2581 Secondary hyperparathyroidism of renal origin: Secondary | ICD-10-CM | POA: Diagnosis not present

## 2015-05-28 DIAGNOSIS — Z23 Encounter for immunization: Secondary | ICD-10-CM | POA: Diagnosis not present

## 2015-05-28 DIAGNOSIS — D508 Other iron deficiency anemias: Secondary | ICD-10-CM | POA: Diagnosis not present

## 2015-05-28 DIAGNOSIS — N186 End stage renal disease: Secondary | ICD-10-CM | POA: Diagnosis not present

## 2015-05-28 DIAGNOSIS — D631 Anemia in chronic kidney disease: Secondary | ICD-10-CM | POA: Diagnosis not present

## 2015-05-29 DIAGNOSIS — T82858A Stenosis of vascular prosthetic devices, implants and grafts, initial encounter: Secondary | ICD-10-CM | POA: Diagnosis not present

## 2015-05-29 DIAGNOSIS — T82318A Breakdown (mechanical) of other vascular grafts, initial encounter: Secondary | ICD-10-CM | POA: Diagnosis not present

## 2015-05-29 DIAGNOSIS — Z992 Dependence on renal dialysis: Secondary | ICD-10-CM | POA: Diagnosis not present

## 2015-05-29 DIAGNOSIS — I10 Essential (primary) hypertension: Secondary | ICD-10-CM | POA: Diagnosis not present

## 2015-05-29 DIAGNOSIS — E785 Hyperlipidemia, unspecified: Secondary | ICD-10-CM | POA: Diagnosis not present

## 2015-05-29 DIAGNOSIS — N186 End stage renal disease: Secondary | ICD-10-CM | POA: Diagnosis not present

## 2015-05-29 DIAGNOSIS — Y841 Kidney dialysis as the cause of abnormal reaction of the patient, or of later complication, without mention of misadventure at the time of the procedure: Secondary | ICD-10-CM | POA: Diagnosis not present

## 2015-06-02 ENCOUNTER — Ambulatory Visit: Payer: Medicare Other | Admitting: Family Medicine

## 2015-06-03 ENCOUNTER — Ambulatory Visit (INDEPENDENT_AMBULATORY_CARE_PROVIDER_SITE_OTHER): Payer: Medicare Other | Admitting: Family Medicine

## 2015-06-03 ENCOUNTER — Encounter: Payer: Self-pay | Admitting: Family Medicine

## 2015-06-03 VITALS — BP 118/74 | HR 88 | Temp 97.5°F | Wt 113.5 lb

## 2015-06-03 DIAGNOSIS — I2510 Atherosclerotic heart disease of native coronary artery without angina pectoris: Secondary | ICD-10-CM | POA: Diagnosis not present

## 2015-06-03 DIAGNOSIS — R05 Cough: Secondary | ICD-10-CM

## 2015-06-03 DIAGNOSIS — R059 Cough, unspecified: Secondary | ICD-10-CM

## 2015-06-03 MED ORDER — LORATADINE 10 MG PO TABS: 10.0000 mg | ORAL_TABLET | Freq: Every day | ORAL | Status: DC

## 2015-06-03 NOTE — Progress Notes (Signed)
Pre visit review using our clinic review tool, if applicable. No additional management support is needed unless otherwise documented below in the visit note. 

## 2015-06-03 NOTE — Addendum Note (Signed)
Addended by: [name] on: 06/03/2015 02:31 PM   Modules accepted: Orders

## 2015-06-03 NOTE — Patient Instructions (Addendum)
I think this may be from allergic rhinitis or seasonal allergies. Start claritin or allegra daily and let us know if persistent cough.

## 2015-06-03 NOTE — Assessment & Plan Note (Addendum)
No signs of infection. Anticipate allergic with weather change and increased pollen. Treat with starting antihistamine (claritin or alegra). Update if not improved with this. Pt agrees with plan

## 2015-06-03 NOTE — Progress Notes (Addendum)
BP 118/74 mmHg  Pulse 88  Temp(Src) 97.5 F (36.4 C) (Oral)  Wt 113 lb 8 oz (51.483 kg)  SpO2 96%   CC: cough  Subjective:    Patient ID: [name], female    DOB: 1932/02/09, 82 y.o.   MRN: [id number]  HPI: [name] is a 82 y.o. female presenting on 06/03/2015 for Cough   2 wk h/o cough, started with rhinorrhea and sore throat. Persistent cough that is not improving. Rhinorrhea and ST may be returning.   No fevers/chills, congestion,   Salt water gargles helped as well as cough syrup.  No sick contacts at home. Someone with bronchitis at dialysis recently.  No h/o asthma or allergic rhinitis.  Recent PFTs normal.  Denies GERD sxs - controlled on protonix 80mg  bid. Has f/u appt tomorrow.   Relevant past medical, surgical, family and social history reviewed and updated as indicated. Interim medical history since our last visit reviewed. Allergies and medications reviewed and updated. Current Outpatient Prescriptions on File Prior to Visit  Medication Sig  . acetaminophen (TYLENOL) 500 MG tablet Take 1,000 mg by mouth every 6 (six) hours as needed for moderate pain.  Marland Kitchen amLODipine (NORVASC) 2.5 MG tablet Take 2.5 mg by mouth at bedtime.  . Artificial Tear Ointment (REFRESH P.M. OP) Place 1 application into the right eye daily.  Marland Kitchen aspirin EC 81 MG tablet Take 1 tablet (81 mg total) by mouth daily.  Marland Kitchen atorvastatin (LIPITOR) 80 MG tablet Take 1 tablet (80 mg total) by mouth daily.  . B Complex-C-Folic Acid (RENA-VITE PO) Take 1 tablet by mouth daily with breakfast.   . Calcium Carbonate Antacid (TUMS ULTRA 1000 PO) Take 3,000 mg by mouth 3 (three) times daily with meals.   . cholecalciferol (VITAMIN D) 1000 UNITS tablet Take 3,000 Units by mouth every Monday, Wednesday, and Friday.   . pantoprazole (PROTONIX) 40 MG tablet Take 1 tablet (40 mg total) by mouth 2 (two) times daily. (Patient taking differently: Take 80 mg by mouth 2 (two) times daily. )  . PRESCRIPTION  MEDICATION [facility] Anemia Therapy   No current facility-administered medications on file prior to visit.    Review of Systems Per HPI unless specifically indicated in ROS section     Objective:    BP 118/74 mmHg  Pulse 88  Temp(Src) 97.5 F (36.4 C) (Oral)  Wt 113 lb 8 oz (51.483 kg)  SpO2 96%  Wt Readings from Last 3 Encounters:  06/03/15 113 lb 8 oz (51.483 kg)  05/27/15 112 lb (50.803 kg)  05/13/15 112 lb (50.803 kg)    Physical Exam  Constitutional: She appears well-developed and well-nourished. No distress.  HENT:  Head: Normocephalic and atraumatic.  Right Ear: Tympanic membrane, external ear and ear canal normal. Decreased hearing is noted.  Left Ear: Tympanic membrane, external ear and ear canal normal. Decreased hearing is noted.  Nose: No mucosal edema or rhinorrhea. Right sinus exhibits no maxillary sinus tenderness and no frontal sinus tenderness. Left sinus exhibits no maxillary sinus tenderness and no frontal sinus tenderness.  Mouth/Throat: Uvula is midline, oropharynx is clear and moist and mucous membranes are normal. No oropharyngeal exudate, posterior oropharyngeal edema, posterior oropharyngeal erythema or tonsillar abscesses.  Pale turbinates  Eyes: Conjunctivae and EOM are normal. Pupils are equal, round, and reactive to light. No scleral icterus.  Neck: Normal range of motion. Neck supple.  Cardiovascular: Normal rate, regular rhythm and intact distal pulses.   Murmur (systolic) heard. Pulmonary/Chest:  Effort normal and breath sounds normal. No respiratory distress. She has no decreased breath sounds. She has no wheezes. She has no rhonchi. She has no rales.  Lungs overall clear - upper airway transmission  Lymphadenopathy:    She has no cervical adenopathy.  Neurological:  Chronic facial droop  Skin: Skin is warm and dry. No rash noted.  Nursing note and vitals reviewed.      Assessment & Plan:   Problem List Items Addressed This Visit    Cough -  Primary    No signs of infection. Anticipate allergic with weather change and increased pollen. Treat with starting antihistamine (claritin or alegra). Update if not improved with this. Pt agrees with plan          Follow up plan: Return if symptoms worsen or fail to improve.

## 2015-06-04 ENCOUNTER — Ambulatory Visit: Payer: Medicare Other | Admitting: Physician Assistant

## 2015-06-04 DIAGNOSIS — M5136 Other intervertebral disc degeneration, lumbar region: Secondary | ICD-10-CM | POA: Diagnosis not present

## 2015-06-04 DIAGNOSIS — M9903 Segmental and somatic dysfunction of lumbar region: Secondary | ICD-10-CM | POA: Diagnosis not present

## 2015-06-04 DIAGNOSIS — M9905 Segmental and somatic dysfunction of pelvic region: Secondary | ICD-10-CM | POA: Diagnosis not present

## 2015-06-04 DIAGNOSIS — M955 Acquired deformity of pelvis: Secondary | ICD-10-CM | POA: Diagnosis not present

## 2015-06-05 ENCOUNTER — Ambulatory Visit (INDEPENDENT_AMBULATORY_CARE_PROVIDER_SITE_OTHER): Payer: Medicare Other | Admitting: Physician Assistant

## 2015-06-05 ENCOUNTER — Encounter: Payer: Self-pay | Admitting: Physician Assistant

## 2015-06-05 VITALS — BP 130/70 | HR 72 | Ht 59.0 in | Wt 113.0 lb

## 2015-06-05 DIAGNOSIS — I2510 Atherosclerotic heart disease of native coronary artery without angina pectoris: Secondary | ICD-10-CM | POA: Diagnosis not present

## 2015-06-05 DIAGNOSIS — D500 Iron deficiency anemia secondary to blood loss (chronic): Secondary | ICD-10-CM | POA: Diagnosis not present

## 2015-06-05 DIAGNOSIS — R195 Other fecal abnormalities: Secondary | ICD-10-CM

## 2015-06-05 DIAGNOSIS — K31819 Angiodysplasia of stomach and duodenum without bleeding: Secondary | ICD-10-CM | POA: Diagnosis not present

## 2015-06-05 NOTE — Progress Notes (Signed)
Reviewed and agree with documentation and assessment and plan. Sub acute to chronic GI blood loss in the setting of Gastric antral vascular ectasia. Will schedule for EGD for endoscopic therapy with APC.  Damaris Hippo , MD

## 2015-06-05 NOTE — Progress Notes (Signed)
Patient ID: [name], female   DOB: 1931/08/24, 82 y.o.   MRN: [id number]   Subjective:    Patient ID: [name], female    DOB: 10-30-1931, 82 y.o.   MRN: [id number]  HPI [name]  is a pleasant 82 year old white female previously known to Dr. Deatra Ina now established with Dr. Silverio Decamp. She has history of Gave syndrome and has undergone multiple prior EGDs with APC. Her last procedure was done in September 2016 per Dr. Deatra Ina she was found to have multiple bleeding vascular ectasia in the stomach which were treated with APC. Patient comes in today referred with drifting hemoglobin over the past 6 weeks. Patient relates that her hemoglobin was down to 7 in January when she was hospitalized and underwent a cardiac catheterization. She was transfused 2 units of blood during that admission and this bumped her hemoglobin to 11.3. Now over the past several weeks she has gradually drifted back down and reports that hemoglobin yesterday at dialysis was 7.8. She says she has noticed darker stools over the past several weeks. There is no in no overt blood. She denies any abdominal discomfort. She does stay on Protonix 40 mg daily and is on a baby aspirin. No regular NSAIDs Patient had an MI in September 2016. Most recent cath in January showed severe three-vessel coronary disease which was not felt to be amenable to stenting. She has been seen by vascular surgery and decision was made not to proceed with CABG as she is felt to be very high risk. She also has history of congestive heart failure with EF of 45%, adult-onset diabetes mellitus, and end-stage renal disease for which she is on dialysis.  Review of Systems Pertinent positive and negative review of systems were noted in the above HPI section.  All other review of systems was otherwise negative.  Outpatient Encounter Prescriptions as of 06/05/2015  Medication Sig  . acetaminophen (TYLENOL) 500 MG tablet Take 1,000 mg by mouth every 6 (six) hours as  needed for moderate pain.  Marland Kitchen amLODipine (NORVASC) 2.5 MG tablet Take 2.5 mg by mouth at bedtime.  . Artificial Tear Ointment (REFRESH P.M. OP) Place 1 application into the right eye daily.  Marland Kitchen aspirin EC 81 MG tablet Take 1 tablet (81 mg total) by mouth daily.  Marland Kitchen atorvastatin (LIPITOR) 80 MG tablet Take 1 tablet (80 mg total) by mouth daily.  . B Complex-C-Folic Acid (RENA-VITE PO) Take 1 tablet by mouth daily with breakfast.   . Calcium Carbonate Antacid (TUMS ULTRA 1000 PO) Take 3,000 mg by mouth 3 (three) times daily with meals.   . cholecalciferol (VITAMIN D) 1000 UNITS tablet Take 3,000 Units by mouth every Monday, Wednesday, and Friday.   . fexofenadine (ALLEGRA) 180 MG tablet Take 180 mg by mouth 2 (two) times daily.  . pantoprazole (PROTONIX) 40 MG tablet Take 1 tablet (40 mg total) by mouth 2 (two) times daily. (Patient taking differently: Take 80 mg by mouth 2 (two) times daily. )  . PRESCRIPTION MEDICATION [facility] Anemia Therapy  . [DISCONTINUED] loratadine (CLARITIN) 10 MG tablet Take 1 tablet (10 mg total) by mouth daily.   No facility-administered encounter medications on file as of 06/05/2015.   Allergies  Allergen Reactions  . Sulfa Antibiotics Rash   Patient Active Problem List   Diagnosis Date Noted  . Cough 06/03/2015  . CAD (coronary artery disease), native coronary artery   . Coronary artery disease 05/06/2015  . Abnormal nuclear stress test   .  Abnormal cardiovascular function study 03/06/2015  . Demand ischemia ([place])   . Acute on chronic diastolic congestive heart failure ([place]) 09/23/2014  . NSTEMI (non-ST elevated myocardial infarction) ([place])   . GAVE (gastric antral vascular ectasia) 04/09/2014  . Anemia due to blood loss, chronic 04/08/2014  . Right shoulder injury 03/12/2014  . Left foot pain 01/10/2014  . Nasal congestion 11/13/2013  . Osteoarthritis, hand 11/13/2013  . Imbalance 11/16/2012  . Abnormal electrocardiogram 07/25/2012  . Mood disorder ([place])  06/01/2012  . Systolic murmur 0000000  . History of diabetes mellitus, type II   . Hyperlipidemia   . Hypertension   . Facial droop   . Anemia in chronic kidney disease   . End stage renal disease on dialysis ([place])   . GERD (gastroesophageal reflux disease)    Social History   Social History  . Marital Status: Single    Spouse Name: N/A  . Number of Children: N/A  . Years of Education: N/A   Occupational History  . 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      retired   Social History Main Topics  . Smoking status: Former Smoker -- 2 years    Types: Cigarettes    Quit date: 03/29/1953  . Smokeless tobacco: Never Used  . Alcohol Use: 0.0 oz/week    0 Standard drinks or equivalent per week     Comment: occasional  . Drug Use: No  . Sexual Activity: Not on file   Other Topics Concern  . Not on file   Social History Narrative   As of 03/2013. Lives alone with her small dog.  Bother and sister in law died last year.     Occupation: retired, was Information systems manager for [employer]   Edu: some college    Ms. Pomplun's family history includes CAD in her father; Cancer in her father and sister; Diabetes in her brother; Heart attack in her father; Heart disease in her brother and father; Hypertension in her brother and mother.      Objective:    Filed Vitals:   06/05/15 1007  BP: 130/70  Pulse: 72    Physical Exam  well-developed elderly white female in no acute distress, pleasant and pressure 130/70 pulse 72 height 4 foot 11 weight 113. HEENT; patient has a prominent right facial droop and eye droop on the right which are chronic. Cardiovascular ;regular rate and rhythm with S1-S2, Pulmonary: clear bilaterally, Abdomen ;soft nontender nondistended bowel sounds are active there is no palpable mass or hepatosplenomegaly, Rectal; exam not done, Ext; no clubbing cyanosis or edema skin warm and dry, Neuropsych: mood and affect  appropriate     Assessment & Plan:   #1 82 yo female with hx of GAVE s/p several previousEGDs and treatment with APC who is referred back today with drop in hemoglobin over the past 6 weeks. Hemoglobin yesterday reported at 7.8. Suspect she has intermittent ongoing GI blood loss from gastric AVMs #2  end-stage renal disease on dialysis #3 coronary artery disease status post recent cath with finding of severe three-vessel disease not amenable to stenting #4 congestive heart failure #5 adult-onset diabetes mellitus  Plan; patient will be scheduled for EGD with APC with Dr. Silverio Decamp  at [facility] within the next few weeks. Procedure  discussed in detail with patient and she is agreeable to proceed Patient is aware that if hemoglobin drops below 7 she should have transfusions, her hemoglobin is being checked with each  dialysis Will continue baby aspirin given severe three-vessel coronary disease Continue Protonix 40 mg by mouth once daily   Avinash Maltos S Jaydalynn Olivero PA-C 06/05/2015   Cc: [name], MD

## 2015-06-05 NOTE — Patient Instructions (Signed)
Continue the Protonix 40 mg ,take by mouth daily in the morning.  You have been scheduled for an endoscopy. Please follow written instructions given to you at your visit today. If you use inhalers (even only as needed), please bring them with you on the day of your procedure. Your physician has requested that you go to www.startemmi.com and enter the access code given to you at your visit today. This web site gives a general overview about your procedure. However, you should still follow specific instructions given to you by our office regarding your preparation for the procedure.

## 2015-06-09 ENCOUNTER — Ambulatory Visit: Payer: Medicare Other | Admitting: Cardiology

## 2015-06-09 ENCOUNTER — Telehealth: Payer: Self-pay | Admitting: Cardiology

## 2015-06-09 DIAGNOSIS — Z79899 Other long term (current) drug therapy: Secondary | ICD-10-CM

## 2015-06-09 DIAGNOSIS — E785 Hyperlipidemia, unspecified: Secondary | ICD-10-CM

## 2015-06-09 NOTE — Telephone Encounter (Signed)
LMTCB

## 2015-06-09 NOTE — Telephone Encounter (Signed)
Please call,pt can not have lab work at the lab,she needs go to the hospital to have it.

## 2015-06-10 NOTE — Telephone Encounter (Signed)
Returned call to patient - she states she tried to return call yesterday and could never get thru. She needs lab orders for her to have blood work at [facility] will not draw her labs as she has an old fistula. Called the [place] office to find out how to place lab orders.   Future, Lab Collect, [facility]  LM for patient that lab orders have been placed and she will need to be fasting for the blood work.

## 2015-06-10 NOTE — Telephone Encounter (Signed)
Returning call from yesterday. °

## 2015-06-11 DIAGNOSIS — E1129 Type 2 diabetes mellitus with other diabetic kidney complication: Secondary | ICD-10-CM | POA: Diagnosis not present

## 2015-06-13 ENCOUNTER — Other Ambulatory Visit: Payer: Self-pay

## 2015-06-13 ENCOUNTER — Emergency Department (HOSPITAL_COMMUNITY): Payer: Medicare Other

## 2015-06-13 ENCOUNTER — Inpatient Hospital Stay (HOSPITAL_COMMUNITY)
Admission: EM | Admit: 2015-06-13 | Discharge: 2015-06-15 | DRG: 377 | Disposition: A | Discharge status: Home / Self Care | Payer: Medicare Other | Attending: Internal Medicine | Admitting: Internal Medicine

## 2015-06-13 ENCOUNTER — Encounter (HOSPITAL_COMMUNITY): Payer: Self-pay

## 2015-06-13 DIAGNOSIS — R938 Abnormal findings on diagnostic imaging of other specified body structures: Secondary | ICD-10-CM | POA: Diagnosis not present

## 2015-06-13 DIAGNOSIS — R9431 Abnormal electrocardiogram [ECG] [EKG]: Secondary | ICD-10-CM | POA: Diagnosis not present

## 2015-06-13 DIAGNOSIS — I248 Other forms of acute ischemic heart disease: Secondary | ICD-10-CM | POA: Diagnosis present

## 2015-06-13 DIAGNOSIS — I2510 Atherosclerotic heart disease of native coronary artery without angina pectoris: Secondary | ICD-10-CM | POA: Diagnosis present

## 2015-06-13 DIAGNOSIS — K31819 Angiodysplasia of stomach and duodenum without bleeding: Secondary | ICD-10-CM

## 2015-06-13 DIAGNOSIS — D500 Iron deficiency anemia secondary to blood loss (chronic): Secondary | ICD-10-CM | POA: Diagnosis not present

## 2015-06-13 DIAGNOSIS — K31811 Angiodysplasia of stomach and duodenum with bleeding: Secondary | ICD-10-CM | POA: Diagnosis not present

## 2015-06-13 DIAGNOSIS — E1122 Type 2 diabetes mellitus with diabetic chronic kidney disease: Secondary | ICD-10-CM | POA: Diagnosis present

## 2015-06-13 DIAGNOSIS — R112 Nausea with vomiting, unspecified: Secondary | ICD-10-CM | POA: Diagnosis not present

## 2015-06-13 DIAGNOSIS — I120 Hypertensive chronic kidney disease with stage 5 chronic kidney disease or end stage renal disease: Secondary | ICD-10-CM | POA: Diagnosis present

## 2015-06-13 DIAGNOSIS — I272 Other secondary pulmonary hypertension: Secondary | ICD-10-CM | POA: Diagnosis not present

## 2015-06-13 DIAGNOSIS — E785 Hyperlipidemia, unspecified: Secondary | ICD-10-CM | POA: Diagnosis not present

## 2015-06-13 DIAGNOSIS — R29810 Facial weakness: Secondary | ICD-10-CM | POA: Diagnosis present

## 2015-06-13 DIAGNOSIS — D631 Anemia in chronic kidney disease: Secondary | ICD-10-CM | POA: Diagnosis present

## 2015-06-13 DIAGNOSIS — Z992 Dependence on renal dialysis: Secondary | ICD-10-CM | POA: Diagnosis not present

## 2015-06-13 DIAGNOSIS — Z87891 Personal history of nicotine dependence: Secondary | ICD-10-CM

## 2015-06-13 DIAGNOSIS — H9190 Unspecified hearing loss, unspecified ear: Secondary | ICD-10-CM | POA: Diagnosis present

## 2015-06-13 DIAGNOSIS — K219 Gastro-esophageal reflux disease without esophagitis: Secondary | ICD-10-CM | POA: Diagnosis present

## 2015-06-13 DIAGNOSIS — Z7982 Long term (current) use of aspirin: Secondary | ICD-10-CM

## 2015-06-13 DIAGNOSIS — I10 Essential (primary) hypertension: Secondary | ICD-10-CM | POA: Diagnosis not present

## 2015-06-13 DIAGNOSIS — M810 Age-related osteoporosis without current pathological fracture: Secondary | ICD-10-CM | POA: Diagnosis present

## 2015-06-13 DIAGNOSIS — R51 Headache: Secondary | ICD-10-CM | POA: Diagnosis not present

## 2015-06-13 DIAGNOSIS — K922 Gastrointestinal hemorrhage, unspecified: Secondary | ICD-10-CM

## 2015-06-13 DIAGNOSIS — I214 Non-ST elevation (NSTEMI) myocardial infarction: Secondary | ICD-10-CM | POA: Diagnosis not present

## 2015-06-13 DIAGNOSIS — R918 Other nonspecific abnormal finding of lung field: Secondary | ICD-10-CM | POA: Diagnosis not present

## 2015-06-13 DIAGNOSIS — E872 Acidosis: Secondary | ICD-10-CM | POA: Diagnosis present

## 2015-06-13 DIAGNOSIS — N2581 Secondary hyperparathyroidism of renal origin: Secondary | ICD-10-CM | POA: Diagnosis present

## 2015-06-13 DIAGNOSIS — E861 Hypovolemia: Secondary | ICD-10-CM | POA: Diagnosis present

## 2015-06-13 DIAGNOSIS — R05 Cough: Secondary | ICD-10-CM | POA: Diagnosis not present

## 2015-06-13 DIAGNOSIS — R079 Chest pain, unspecified: Secondary | ICD-10-CM | POA: Diagnosis not present

## 2015-06-13 DIAGNOSIS — Z79899 Other long term (current) drug therapy: Secondary | ICD-10-CM

## 2015-06-13 DIAGNOSIS — D649 Anemia, unspecified: Secondary | ICD-10-CM | POA: Diagnosis not present

## 2015-06-13 DIAGNOSIS — E8889 Other specified metabolic disorders: Secondary | ICD-10-CM | POA: Diagnosis present

## 2015-06-13 DIAGNOSIS — Z853 Personal history of malignant neoplasm of breast: Secondary | ICD-10-CM

## 2015-06-13 DIAGNOSIS — N186 End stage renal disease: Secondary | ICD-10-CM | POA: Diagnosis present

## 2015-06-13 DIAGNOSIS — R519 Headache, unspecified: Secondary | ICD-10-CM

## 2015-06-13 DIAGNOSIS — I25118 Atherosclerotic heart disease of native coronary artery with other forms of angina pectoris: Secondary | ICD-10-CM | POA: Diagnosis present

## 2015-06-13 DIAGNOSIS — N189 Chronic kidney disease, unspecified: Secondary | ICD-10-CM

## 2015-06-13 DIAGNOSIS — R9389 Abnormal findings on diagnostic imaging of other specified body structures: Secondary | ICD-10-CM | POA: Diagnosis present

## 2015-06-13 DIAGNOSIS — R200 Anesthesia of skin: Secondary | ICD-10-CM | POA: Diagnosis not present

## 2015-06-13 HISTORY — DX: Malignant neoplasm of unspecified site of unspecified female breast: C50.919

## 2015-06-13 HISTORY — DX: Unspecified hearing loss, unspecified ear: H91.90

## 2015-06-13 LAB — CBC
HEMATOCRIT: 26.8 % — AB (ref 36.0–46.0)
HEMOGLOBIN: 8.6 g/dL — AB (ref 12.0–15.0)
MCH: 28.3 pg (ref 26.0–34.0)
MCHC: 32.1 g/dL (ref 30.0–36.0)
MCV: 88.2 fL (ref 78.0–100.0)
Platelets: 251 10*3/uL (ref 150–400)
RBC: 3.04 MIL/uL — ABNORMAL LOW (ref 3.87–5.11)
RDW: 16.5 % — ABNORMAL HIGH (ref 11.5–15.5)
WBC: 7.9 10*3/uL (ref 4.0–10.5)

## 2015-06-13 LAB — TROPONIN I
TROPONIN I: 0.21 ng/mL — AB (ref <0.031)
Troponin I: 16.31 ng/mL (ref <0.031)

## 2015-06-13 LAB — RENAL FUNCTION PANEL
ALBUMIN: 3.1 g/dL — AB (ref 3.5–5.0)
ANION GAP: 13 (ref 5–15)
BUN: 19 mg/dL (ref 6–20)
CALCIUM: 8.1 mg/dL — AB (ref 8.9–10.3)
CO2: 28 mmol/L (ref 22–32)
Chloride: 96 mmol/L — ABNORMAL LOW (ref 101–111)
Creatinine, Ser: 3.11 mg/dL — ABNORMAL HIGH (ref 0.44–1.00)
GFR calc non Af Amer: 13 mL/min — ABNORMAL LOW (ref >60)
GFR, EST AFRICAN AMERICAN: 15 mL/min — AB (ref >60)
GLUCOSE: 123 mg/dL — AB (ref 65–99)
PHOSPHORUS: 2.8 mg/dL (ref 2.5–4.6)
Potassium: 3.3 mmol/L — ABNORMAL LOW (ref 3.5–5.1)
SODIUM: 137 mmol/L (ref 135–145)

## 2015-06-13 LAB — HEPATITIS B SURFACE ANTIGEN: Hepatitis B Surface Ag: NEGATIVE

## 2015-06-13 LAB — CBC WITH DIFFERENTIAL/PLATELET
Basophils Absolute: 0 10*3/uL (ref 0.0–0.1)
Basophils Relative: 0 %
EOS ABS: 0.1 10*3/uL (ref 0.0–0.7)
Eosinophils Relative: 1 %
HCT: 20.1 % — ABNORMAL LOW (ref 36.0–46.0)
HEMOGLOBIN: 5.9 g/dL — AB (ref 12.0–15.0)
LYMPHS ABS: 1.4 10*3/uL (ref 0.7–4.0)
LYMPHS PCT: 23 %
MCH: 28.1 pg (ref 26.0–34.0)
MCHC: 29.4 g/dL — ABNORMAL LOW (ref 30.0–36.0)
MCV: 95.7 fL (ref 78.0–100.0)
Monocytes Absolute: 0.6 10*3/uL (ref 0.1–1.0)
Monocytes Relative: 9 %
NEUTROS PCT: 66 %
Neutro Abs: 4.1 10*3/uL (ref 1.7–7.7)
Platelets: 280 10*3/uL (ref 150–400)
RBC: 2.1 MIL/uL — AB (ref 3.87–5.11)
RDW: 17.2 % — ABNORMAL HIGH (ref 11.5–15.5)
WBC: 6.1 10*3/uL (ref 4.0–10.5)

## 2015-06-13 LAB — BASIC METABOLIC PANEL
ANION GAP: 15 (ref 5–15)
BUN: 72 mg/dL — AB (ref 6–20)
CHLORIDE: 99 mmol/L — AB (ref 101–111)
CO2: 25 mmol/L (ref 22–32)
Calcium: 9.1 mg/dL (ref 8.9–10.3)
Creatinine, Ser: 6.7 mg/dL — ABNORMAL HIGH (ref 0.44–1.00)
GFR calc Af Amer: 6 mL/min — ABNORMAL LOW (ref >60)
GFR calc non Af Amer: 5 mL/min — ABNORMAL LOW (ref >60)
Glucose, Bld: 166 mg/dL — ABNORMAL HIGH (ref 65–99)
POTASSIUM: 5.4 mmol/L — AB (ref 3.5–5.1)
SODIUM: 139 mmol/L (ref 135–145)

## 2015-06-13 LAB — I-STAT ARTERIAL BLOOD GAS, ED
ACID-BASE EXCESS: 3 mmol/L — AB (ref 0.0–2.0)
BICARBONATE: 26.5 meq/L — AB (ref 20.0–24.0)
O2 Saturation: 94 %
TCO2: 28 mmol/L (ref 0–100)
pCO2 arterial: 37.1 mmHg (ref 35.0–45.0)
pH, Arterial: 7.463 — ABNORMAL HIGH (ref 7.350–7.450)
pO2, Arterial: 65 mmHg — ABNORMAL LOW (ref 80.0–100.0)

## 2015-06-13 LAB — I-STAT CG4 LACTIC ACID, ED: LACTIC ACID, VENOUS: 3.26 mmol/L — AB (ref 0.5–2.0)

## 2015-06-13 LAB — CBG MONITORING, ED: Glucose-Capillary: 194 mg/dL — ABNORMAL HIGH (ref 65–99)

## 2015-06-13 LAB — POC OCCULT BLOOD, ED: FECAL OCCULT BLD: POSITIVE — AB

## 2015-06-13 LAB — MRSA PCR SCREENING: MRSA BY PCR: NEGATIVE

## 2015-06-13 LAB — PREPARE RBC (CROSSMATCH)

## 2015-06-13 MED ORDER — SODIUM CHLORIDE 0.9 % IV SOLN: INTRAVENOUS | Status: DC

## 2015-06-13 MED ORDER — ACETAMINOPHEN 325 MG PO TABS: 650.0000 mg | ORAL_TABLET | Freq: Four times a day (QID) | ORAL | Status: DC | PRN

## 2015-06-13 MED ORDER — DARBEPOETIN ALFA 200 MCG/0.4ML IJ SOSY
200.0000 ug | PREFILLED_SYRINGE | INTRAMUSCULAR | Status: DC
  Administered 2015-06-13: 200 ug via INTRAVENOUS

## 2015-06-13 MED ORDER — NA FERRIC GLUC CPLX IN SUCROSE 12.5 MG/ML IV SOLN
250.0000 mg | INTRAVENOUS | Status: DC
  Administered 2015-06-13: 250 mg via INTRAVENOUS
  Filled 2015-06-13 (×2): qty 20

## 2015-06-13 MED ORDER — SODIUM CHLORIDE 0.9 % IV SOLN: 100.0000 mL | INTRAVENOUS | Status: DC | PRN

## 2015-06-13 MED ORDER — SODIUM CHLORIDE 0.9% FLUSH
3.0000 mL | Freq: Two times a day (BID) | INTRAVENOUS | Status: DC
  Administered 2015-06-14 – 2015-06-15 (×3): 3 mL via INTRAVENOUS

## 2015-06-13 MED ORDER — LIDOCAINE-PRILOCAINE 2.5-2.5 % EX CREA: 1.0000 "application " | TOPICAL_CREAM | CUTANEOUS | Status: DC | PRN

## 2015-06-13 MED ORDER — DARBEPOETIN ALFA 200 MCG/0.4ML IJ SOSY
PREFILLED_SYRINGE | INTRAMUSCULAR | Status: AC
  Administered 2015-06-13: 200 ug via INTRAVENOUS
  Filled 2015-06-13: qty 0.4

## 2015-06-13 MED ORDER — MORPHINE SULFATE (PF) 2 MG/ML IV SOLN
1.0000 mg | INTRAVENOUS | Status: DC | PRN
Start: 2015-06-13 — End: 2015-06-15

## 2015-06-13 MED ORDER — LIDOCAINE HCL (PF) 1 % IJ SOLN: 5.0000 mL | INTRAMUSCULAR | Status: DC | PRN

## 2015-06-13 MED ORDER — PENTAFLUOROPROP-TETRAFLUOROETH EX AERO: 1.0000 "application " | INHALATION_SPRAY | CUTANEOUS | Status: DC | PRN

## 2015-06-13 MED ORDER — ALTEPLASE 2 MG IJ SOLR: 2.0000 mg | Freq: Once | INTRAMUSCULAR | Status: DC | PRN

## 2015-06-13 MED ORDER — HEPARIN SODIUM (PORCINE) 1000 UNIT/ML DIALYSIS: 1000.0000 [IU] | INTRAMUSCULAR | Status: DC | PRN

## 2015-06-13 MED ORDER — PANTOPRAZOLE SODIUM 40 MG IV SOLR
40.0000 mg | Freq: Once | INTRAVENOUS | Status: AC
  Administered 2015-06-13: 40 mg via INTRAVENOUS
  Filled 2015-06-13: qty 40

## 2015-06-13 MED ORDER — SODIUM CHLORIDE 0.9 % IV SOLN
Freq: Once | INTRAVENOUS | Status: AC
  Administered 2015-06-13: 10:00:00 via INTRAVENOUS

## 2015-06-13 MED ORDER — CALCITRIOL 0.5 MCG PO CAPS
1.2500 ug | ORAL_CAPSULE | ORAL | Status: DC
  Administered 2015-06-13: 1.25 ug via ORAL
  Filled 2015-06-13: qty 1

## 2015-06-13 MED ORDER — ACETAMINOPHEN 650 MG RE SUPP: 650.0000 mg | Freq: Four times a day (QID) | RECTAL | Status: DC | PRN

## 2015-06-13 MED ORDER — METOPROLOL TARTRATE 12.5 MG HALF TABLET
12.5000 mg | ORAL_TABLET | Freq: Two times a day (BID) | ORAL | Status: DC
  Administered 2015-06-13 – 2015-06-15 (×4): 12.5 mg via ORAL
  Filled 2015-06-13 (×4): qty 1

## 2015-06-13 NOTE — Consult Note (Signed)
[facility] Renal Consultation Note    Indication for Consultation:  Management of ESRD/hemodialysis; anemia, hypertension/volume and secondary hyperparathyroidism PCP: [name], MD  HPI: [name] is a 82 y.o. 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   She  had four procedures in 2016 for ablation of AVMs.   Today, she awoke with nausea and vomiting and jaw pain early this am and called EMS. Yesterday she said she felt fine.  She went out to lunch with friends and cooked dinner this week.  She had recent fall with skin tear on left forearm.   Hgb has been trending down gradually despite increase in Mircera.  BMs have been chronically dark. Hgb was 8.8 3/3 and was 7.2 3/15 with tsat 10% and ferritin 111.  Evaluation in the ED showed EKG with  NSR with diffuse ST depression and hgb of 5.9.   CXR   Showed ? Right mid and lower lung atx vs infiltrate.  No overt edema. She is being transfused on HD.  GI has evaluated and plans to do EGD. She denies SOB or N, V, diaphoresis while on HD.  Her K is 5.4 BUN BUN 72. Troponin 0.21 Lactic acid 3.21 WBC wnl. FOB+ She states she can only be on 3 hours of HD because of back pain from prolonged sitting.  Past Medical History  Diagnosis Date  . Hyperlipidemia   . Hypertension   . Anxiety   . Facial droop 1935    acquired during forceps delivery, some residual R visual loss  . Hx of breast cancer 2004  . Arthritis     mild in lower back (Drs Posey Pronto and Joelyn Oms)  . GERD (gastroesophageal reflux disease)   . Bradycardia 2014    a. Noted 06/2012.  Marland Kitchen Hypotension     a. Associated w/ dialysis.  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 [name])  . Osteoporosis 11/2013    DEXA -3.4  radius  . GAVE (gastric antral vascular ectasia) 08/2014    s/p EGD with APC  . Anemia in chronic kidney disease     IV iron infusion and anemia from GAVE blood loss.  . End stage renal disease on dialysis [facility])     HD M,W,F [place] AVF now LUE AVF  . Breast cancer ([place])     Left Breast 12 yrs. ago  . Hearing loss     Bilateral hearing aids  . Sight impaired     Left: wears contact. Right: diminished peripheral vision  . Cardiac murmur     a. thought due to AVF (2D echo 06/2012 without significant valvular disease).   Marland Kitchen CAD (coronary artery disease), native coronary artery     3 vessel CAD by cath 04/2015   Past Surgical History  Procedure Laterality Date  . Cataract extraction  1981  . Av fistula placement  07/20/10    Right brachiocephalic AVF  . Umbilical hernia repair  2011  . Dexa  2013    [facility]  . US echocardiography  06/2012    LVH, EF 65%, grade 1 diastol dysfunction, mod dliated LAD  . Eye surgery  1966    regular cataract  . Breast lumpectomy Left   . Breast  biopsy Left   . Bascilic vein transposition Left 07/24/2013    Procedure: BASILIC VEIN TRANSPOSITION;  Surgeon: [name], MD;  Location: [place];  Service: Vascular;  Laterality: Left;  . Shuntogram N/A 04/16/2011    Procedure: Earney Mallet;  Surgeon: [name], MD;  Location: [facility] CATH LAB;  Service: Cardiovascular;  Laterality: N/A;  . Shuntogram Right 07/05/2013    Procedure: FISTULOGRAM;  Surgeon: [name], MD;  Location: [facility] CATH LAB;  Service: Cardiovascular;  Laterality: Right;  . Esophagogastroduodenoscopy N/A 04/09/2014    Procedure: ESOPHAGOGASTRODUODENOSCOPY (EGD);  Surgeon: [name], MD;  Location: [place];  Service: Endoscopy;  Laterality: N/A;  . Peripheral vascular catheterization N/A 08/15/2014    Procedure: A/V Shuntogram/Fistulagram;  Surgeon: [name], MD;  Location: [place] CV LAB;  Service: Cardiovascular;  Laterality: N/A;  .  Esophagogastroduodenoscopy N/A 09/23/2014    Procedure: ESOPHAGOGASTRODUODENOSCOPY (EGD);  Surgeon: [name], MD;  Location: [place];  Service: Endoscopy;  Laterality: N/A;  . Hernia repair    . Esophagogastroduodenoscopy (egd) with propofol N/A 11/12/2014    Procedure: ESOPHAGOGASTRODUODENOSCOPY (EGD) WITH PROPOFOL;  Surgeon: [name], MD;  Location: [facility] ENDOSCOPY;  Service: Endoscopy;  Laterality: N/A;  . Hot hemostasis N/A 11/12/2014    Procedure: HOT HEMOSTASIS (ARGON PLASMA COAGULATION/BICAP);  Surgeon: [name], MD;  Location: [facility];  Service: Endoscopy;  Laterality: N/A;  . Esophagogastroduodenoscopy N/A 12/26/2014    neg H pylori, benign biopsies; ESOPHAGOGASTRODUODENOSCOPY (EGD);  Surgeon: [name], MD  . Cardiac catheterization N/A 04/10/2015    Procedure: Left Heart Cath and Coronary Angiography;  Surgeon: [name], MD;  Location: [place] CV LAB;  Service: Cardiovascular;  Laterality: N/A;  . Peripheral vascular catheterization N/A 04/28/2015    Procedure: A/V Shuntogram/Fistulagram;  Surgeon: [name], MD;  Location: [place] CV LAB;  Service: Cardiovascular;  Laterality: N/A;  . Peripheral vascular catheterization Left 04/28/2015    Procedure: A/V Shunt Intervention;  Surgeon: [name], MD;  Location: [place] CV LAB;  Service: Cardiovascular;  Laterality: Left;   Family History  Problem Relation Age of Onset  . Heart disease Brother     Congenital  . Hypertension Brother   . Diabetes Brother   . Cancer Father     stomach  . CAD Father     MI at age 48  . Heart disease Father   . Heart attack Father   . Cancer Sister     female (uterus?)  . Hypertension Mother    Social History:  reports that she quit smoking about 62 years ago. Her smoking use included Cigarettes. She quit after 2 years of use. She has never used smokeless tobacco. She reports that she drinks alcohol. She reports that she does not use illicit  drugs. Allergies  Allergen Reactions  . Sulfa Antibiotics Rash   Prior to Admission medications   Medication Sig Start Date End Date Taking? Authorizing Provider  acetaminophen (TYLENOL) 500 MG tablet Take 1,000 mg by mouth every 6 (six) hours as needed for moderate pain.   Yes Historical Provider, MD  amLODipine (NORVASC) 2.5 MG tablet Take 2.5 mg by mouth at bedtime.   Yes Historical Provider, MD  Artificial Tear Ointment (REFRESH P.M. OP) Place 1 application into the right eye daily.   Yes Historical Provider, MD  aspirin EC 81 MG tablet Take 1 tablet (81 mg total) by mouth daily. 03/06/15  Yes Lelon Perla, MD  atorvastatin (LIPITOR) 80 MG tablet Take 1 tablet (80 mg total) by mouth daily. 05/06/15  Yes Lelon Perla, MD  B Complex-C-Folic Acid (RENA-VITE PO) Take 1 tablet by mouth daily with breakfast.    Yes Historical Provider, MD  Calcium Carbonate Antacid (TUMS ULTRA 1000 PO) Take 3,000 mg by mouth 3 (three) times daily with meals.    Yes Historical Provider, MD  cholecalciferol (VITAMIN D) 1000 UNITS tablet Take 3,000 Units by mouth every Monday, Wednesday, and Friday.    Yes Historical Provider, MD  fexofenadine (ALLEGRA) 180 MG tablet Take 180 mg by mouth 2 (two) times daily.   Yes Historical Provider, MD  pantoprazole (PROTONIX) 40 MG tablet Take 1 tablet (40 mg total) by mouth 2 (two) times daily. Patient taking differently: Take 80 mg by mouth daily.  05/08/15  Yes Mauri Pole, MD  PRESCRIPTION MEDICATION [facility] Anemia Therapy   Yes Historical Provider, MD   Current Facility-Administered Medications  Medication Dose Route Frequency Provider Last Rate Last Dose  . 0.9 %  sodium chloride infusion  100 mL Intravenous PRN Alric Seton, PA-C      . 0.9 %  sodium chloride infusion  100 mL Intravenous PRN Alric Seton, PA-C      . alteplase (CATHFLO ACTIVASE) injection 2 mg  2 mg Intracatheter Once PRN Alric Seton, PA-C      . calcitRIOL (ROCALTROL) capsule 1.25 mcg   1.25 mcg Oral Q M,W,F-HD Alric Seton, PA-C      . Darbepoetin Alfa (ARANESP) 200 MCG/0.4ML injection           . [START ON 06/18/2015] Darbepoetin Alfa (ARANESP) injection 200 mcg  200 mcg Intravenous Q Wed-HD Alric Seton, PA-C      . ferric gluconate (NULECIT) 250 mg in sodium chloride 0.9 % 100 mL IVPB  250 mg Intravenous Q M,W,F-HD Alric Seton, PA-C      . heparin injection 1,000 Units  1,000 Units Dialysis PRN Alric Seton, PA-C      . lidocaine (PF) (XYLOCAINE) 1 % injection 5 mL  5 mL Intradermal PRN Alric Seton, PA-C      . lidocaine-prilocaine (EMLA) cream 1 application  1 application Topical PRN Alric Seton, PA-C      . pentafluoroprop-tetrafluoroeth (GEBAUERS) aerosol 1 application  1 application Topical PRN Alric Seton, PA-C       Labs: Basic Metabolic Panel:  Recent Labs Lab 06/13/15 0741  NA 139  K 5.4*  CL 99*  CO2 25  GLUCOSE 166*  BUN 72*  CREATININE 6.70*  CALCIUM 9.1   4   Recent Labs Lab 06/13/15 0741  WBC 6.1  NEUTROABS 4.1  HGB 5.9*  HCT 20.1*  MCV 95.7  PLT 280   Cardiac Enzymes:  Recent Labs Lab 06/13/15 0741  TROPONINI 0.21*   CBG:  Recent Labs Lab 06/13/15 0509  GLUCAP 194*  Studies/Results: Dg Chest 2 View  06/13/2015  CLINICAL DATA:  Dry cough.  History of hypertension and CAD. EXAM: CHEST  2 VIEW COMPARISON:  09/22/2014; 10/23/2013; 07/24/2013; 09/14/2010 FINDINGS: Obscuration of the right heart border secondary to worsening right mid lung heterogeneous airspace opacities. Otherwise, grossly unchanged enlarged cardiac silhouette and mediastinal contours with atherosclerotic plaque within the thoracic aorta. Partially calcified bilateral hilar lymph nodes, left greater than right. There is persistent moderate elevation of the right hemidiaphragm. No pleural effusion or pneumothorax. No evidence of edema. Unchanged bones including severe (greater 75%) compression deformity involving a lower thoracic vertebral body with  associated focal kyphosis this location. IMPRESSION: 1. Worsening right mid and lower lung heterogeneous airspace opacities - atelectasis versus infiltrate, progressed since the 08/2014 examination though new compared to the 07/24/2013 examination. As a right perihilar mass could result in a similar appearance, a follow-up chest radiograph in 3 to 4 weeks after treatment is recommended to ensure resolution. 2. Similar findings of cardiomegaly without definitive evidence of edema. 3. Unchanged moderate elevation of the right hemidiaphragm. Electronically Signed   By: Sandi Mariscal M.D.   On: 06/13/2015 08:11   Ct Head Wo Contrast  06/13/2015  CLINICAL DATA:  Right-sided facial numbness and pain. EXAM: CT HEAD WITHOUT CONTRAST CT MAXILLOFACIAL WITHOUT CONTRAST TECHNIQUE: Multidetector CT imaging of the head and maxillofacial structures were performed using the standard protocol without intravenous contrast. Multiplanar CT image reconstructions of the maxillofacial structures were also generated. COMPARISON:  None. FINDINGS: CT HEAD FINDINGS Bony calvarium appears intact. Mild diffuse cortical atrophy is noted. No mass effect or midline shift is noted. Ventricular size is within normal limits. There is no evidence of mass lesion, hemorrhage or acute infarction. CT MAXILLOFACIAL FINDINGS No evidence of fracture or other bony abnormality. Paranasal sinuses appear normal. Pterygoid plates appear normal. Globes and orbits appear normal. IMPRESSION: Mild diffuse cortical atrophy. No acute intracranial abnormality seen. No significant abnormality seen in the maxillofacial region. Electronically Signed   By: Marijo Conception, M.D.   On: 06/13/2015 09:30   Ct Maxillofacial Wo Cm  06/13/2015  CLINICAL DATA:  Right-sided facial numbness and pain. EXAM: CT HEAD WITHOUT CONTRAST CT MAXILLOFACIAL WITHOUT CONTRAST TECHNIQUE: Multidetector CT imaging of the head and maxillofacial structures were performed using the standard  protocol without intravenous contrast. Multiplanar CT image reconstructions of the maxillofacial structures were also generated. COMPARISON:  None. FINDINGS: CT HEAD FINDINGS Bony calvarium appears intact. Mild diffuse cortical atrophy is noted. No mass effect or midline shift is noted. Ventricular size is within normal limits. There is no evidence of mass lesion, hemorrhage or acute infarction. CT MAXILLOFACIAL FINDINGS No evidence of fracture or other bony abnormality. Paranasal sinuses appear normal. Pterygoid plates appear normal. Globes and orbits appear normal. IMPRESSION: Mild diffuse cortical atrophy. No acute intracranial abnormality seen. No significant abnormality seen in the maxillofacial region. Electronically Signed   By: Marijo Conception, M.D.   On: 06/13/2015 09:30    ROS: As per HPI otherwise negative.  Physical Exam: goal 2.5 Filed Vitals:   06/13/15 1111 06/13/15 1125 06/13/15 1138 06/13/15 1153  BP: 156/83 164/83 165/82 135/74  Pulse: 90 86 83   Temp:   98 F (36.7 C) 97.7 F (36.5 C)  TempSrc:   Oral Oral  Resp:   21 24  Height:      Weight:      SpO2:   96% 96%     General:  Alert talkative NAD on HD sitting on stretcher  Head:  Chronic right facial paralysis mucus membranes are moist Neck: Supple. JVD not elevated. Lungs: grossly clear, dim bases  Breathing is unlabored. Heart: RRR with 2/6 murmur Abdomen: Soft, non-tender, non-distended with normoactive bowel sounds. Lower extremities:without edema or ischemic changes, no open wounds  Neuro: Alert and oriented X 3. Moves all extremities spontaneously. Psych:  Responds to questions appropriately with a normal affect. Dialysis Access: left upper AVF patent  Dialysis Orders:  [facility] MWF 3 hr 350/ 800 EDW 51 - leaving at 50.5 2 K 2 Ca profile 4 left upper  AVF heaprin 2000 venofer 50  per week - Mircera 225 last had 200 3/8 calctiriol 1.5 Recent labs:  hgb 7.2 10% sat ferritin 111 iPTH 103  3/15  Assessment/Plan: 1. Recurrent GIB - likely due to GAVE - for endoscopy Saturday 2. Multivessel CAD no amendable to [facility] 03/2015 cath - borderline candidate - pt declined; it would be much better for her heart if she ran longer treatments and had lower UF rates.  Serial Troponins ordered; full code 3. ESRD -  MWF - on HD - she really would benefit cardiac wise for longer dialysis; but is resistant - K is a little high today and since she is getting blood needs to run longer but she may sign off, because she already knows she can't run more than hours - encouraged her to try;  She needs to have dialysis related heparin discontinued permanently;  Repeat labs in AM 4.  Hypertension/volume  - getting below edw at 50.5; she is not hypovolemia, but her hgb is low and likely induced ischemia- BB started 5.  Anemia  - Hgb 5.9 - transfusing 2 units PRBC - follow hgb and transfuse prn - replete Fe and continue ESA, trend hgb. Transfuse prn- after discharge - need to transfuse for all hgb < 8.5 given cardiac/GI history. 6.  Metabolic bone disease -  iPTH down this month to 103 after increase in calcitriol from 1 to 1.5 - have reduced dose to 1.25 - need to change at discharge-  7.  Nutrition - renal diet.renavite/  Myriam Jacobson, PA-C [facility] [phone] 06/13/2015, 11:59 AM   Renal Attending: Has a hx or recurrent GI bleed admitted on this occasion with profound anemia.  GI plans endoscopy and intervention in AM.  WIll give PRBCs with HD today.  She has significant CAD with the current stress of anemia. Adyen Bifulco C

## 2015-06-13 NOTE — Consult Note (Signed)
[facility] Consult: 9:51 AM 06/13/2015  LOS: 0 days    Referring Provider: Dr Wyvonnia Dusky in ED  Primary Care Physician:  [name], MD Primary Gastroenterologist:  Dr. Silverio Decamp    Reason for Consultation:  Hgb 5.9, "melenic" stools   HPI: [name] is a 82 y.o. female.  ESRD, HD MWF.  Type 2 DM.  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  HTn.  HLD.  Breast cancer and lumpectomy 2004.    CAD: S/p cardiac cath 04/10/2015: severe 3 vsl CAD, not amenable to perc intervention  CABG/operative "risk borders on the prohibitive" per Dr Roxan Hockey.  And after careful consideration she declined CABG.  EF 45%.   Hx GAVE syndrome with chronic blood loss and anemia.  Treated with iron infusions and periodic transfusion.  Last 2 PRBCs 04/29/15.  12/26/14 EGD.  Dr Deatra Ina.  APC ablation of vascular ectasia at gastric antrum.  Edematous prepyloric folds, bx/d/path:  11/12/14 EGD.  Dr Deatra Ina.  Bleeding antral vascular ectasia: APC ablated with incomplete hemostasis.  09/23/14 EGD.  APC ablation of gastric antral AVMs.  04/09/2014 EGD. APC ablation of antral AVMs.  3 cm HH.   GI OV 3/9 for drifting Hgb (12.3 post transfusion 03/2015, down to 7.8 06/04/15). Pt on schedule for 06/24/15 EGD with Dr Delane Ginger.  On daily PPI and 81 ASA, no blood thinners/platelet disrupters.  Stools have been darker than usual for ~ 6 weeks, but not melenic or bloody, just dark brown.   This AM developed pain in her right jaw which historically is her anginal equivalent.  No chest pressure or pain (has never had classic angina).  Also developed nausea.  After it did not subside over 2 hours, she came to ED.  She does not have NTG at home and was not given NTG in ED.   Hgb today is 5.9.  CT head, neck: mild cortical atrophy.    Past Medical History    Diagnosis Date  . Hyperlipidemia   . Hypertension   . Anxiety   . Facial droop 1935    acquired during forceps delivery, some residual R visual loss  . Hx of breast cancer 2004  . Arthritis     mild in lower back (Drs Posey Pronto and Joelyn Oms)  . GERD (gastroesophageal reflux disease)   . Bradycardia 2014    a. Noted 06/2012.  Marland Kitchen Hypotension     a. Associated w/ dialysis.  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 [name])  . Osteoporosis 11/2013    DEXA -3.4 radius  . GAVE (gastric antral vascular ectasia) 08/2014    s/p EGD with APC  . Anemia in chronic kidney disease     IV iron infusion and anemia from GAVE blood loss.  . End stage renal disease on dialysis [facility])     HD M,W,F [place] AVF now LUE AVF  . Breast cancer ([place])     Left Breast 12 yrs. ago  . Hearing loss     Bilateral hearing  aids  . Sight impaired     Left: wears contact. Right: diminished peripheral vision  . Cardiac murmur     a. thought due to AVF (2D echo 06/2012 without significant valvular disease).   Marland Kitchen CAD (coronary artery disease), native coronary artery     3 vessel CAD by cath 04/2015    Past Surgical History  Procedure Laterality Date  . Cataract extraction  1981  . Av fistula placement  07/20/10    Right brachiocephalic AVF  . Umbilical hernia repair  2011  . Dexa  2013    [facility]  . US echocardiography  06/2012    LVH, EF 65%, grade 1 diastol dysfunction, mod dliated LAD  . Eye surgery  1966    regular cataract  . Breast lumpectomy Left   . Breast biopsy Left   . Bascilic vein transposition Left 07/24/2013    Procedure: BASILIC VEIN TRANSPOSITION;  Surgeon: [name], MD;  Location: [place];  Service: Vascular;  Laterality: Left;  . Shuntogram N/A 04/16/2011    Procedure: Earney Mallet;  Surgeon: [name], MD;  Location: [facility] CATH LAB;  Service: Cardiovascular;  Laterality: N/A;  . Shuntogram Right 07/05/2013    Procedure: FISTULOGRAM;  Surgeon: [name], MD;  Location: [facility] CATH LAB;  Service: Cardiovascular;  Laterality: Right;  . Esophagogastroduodenoscopy N/A 04/09/2014    Procedure: ESOPHAGOGASTRODUODENOSCOPY (EGD);  Surgeon: [name], MD;  Location: [place];  Service: Endoscopy;  Laterality: N/A;  . Peripheral vascular catheterization N/A 08/15/2014    Procedure: A/V Shuntogram/Fistulagram;  Surgeon: [name], MD;  Location: [place] CV LAB;  Service: Cardiovascular;  Laterality: N/A;  . Esophagogastroduodenoscopy N/A 09/23/2014    Procedure: ESOPHAGOGASTRODUODENOSCOPY (EGD);  Surgeon: [name], MD;  Location: [place];  Service: Endoscopy;  Laterality: N/A;  . Hernia repair    . Esophagogastroduodenoscopy (egd) with propofol N/A 11/12/2014    Procedure: ESOPHAGOGASTRODUODENOSCOPY (EGD) WITH PROPOFOL;  Surgeon: [name], MD;  Location: [facility] ENDOSCOPY;  Service: Endoscopy;  Laterality: N/A;  . Hot hemostasis N/A 11/12/2014    Procedure: HOT HEMOSTASIS (ARGON PLASMA COAGULATION/BICAP);  Surgeon: [name], MD;  Location: [facility];  Service: Endoscopy;  Laterality: N/A;  . Esophagogastroduodenoscopy N/A 12/26/2014    neg H pylori, benign biopsies; ESOPHAGOGASTRODUODENOSCOPY (EGD);  Surgeon: [name], MD  . Cardiac catheterization N/A 04/10/2015    Procedure: Left Heart Cath and Coronary Angiography;  Surgeon: [name], MD;  Location: [place] CV LAB;  Service: Cardiovascular;  Laterality: N/A;  . Peripheral vascular catheterization N/A 04/28/2015    Procedure: A/V Shuntogram/Fistulagram;  Surgeon: [name], MD;  Location: [place] CV LAB;  Service: Cardiovascular;  Laterality: N/A;  . Peripheral vascular catheterization Left 04/28/2015    Procedure: A/V Shunt Intervention;  Surgeon: [name], MD;  Location: [place] CV LAB;  Service: Cardiovascular;  Laterality: Left;    Prior to Admission medications   Medication Sig Start Date End Date Taking? Authorizing Provider   acetaminophen (TYLENOL) 500 MG tablet Take 1,000 mg by mouth every 6 (six) hours as needed for moderate pain.   Yes Historical Provider, MD  amLODipine (NORVASC) 2.5 MG tablet Take 2.5 mg by mouth at bedtime.   Yes Historical Provider, MD  Artificial Tear Ointment (REFRESH P.M. OP) Place 1 application into the right eye daily.   Yes Historical Provider, MD  aspirin EC 81 MG tablet Take 1 tablet (81 mg total) by mouth daily.  03/06/15  Yes Lelon Perla, MD  atorvastatin (LIPITOR) 80 MG tablet Take 1 tablet (80 mg total) by mouth daily. 05/06/15  Yes Lelon Perla, MD  B Complex-C-Folic Acid (RENA-VITE PO) Take 1 tablet by mouth daily with breakfast.    Yes Historical Provider, MD  Calcium Carbonate Antacid (TUMS ULTRA 1000 PO) Take 3,000 mg by mouth 3 (three) times daily with meals.    Yes Historical Provider, MD  cholecalciferol (VITAMIN D) 1000 UNITS tablet Take 3,000 Units by mouth every Monday, Wednesday, and Friday.    Yes Historical Provider, MD  fexofenadine (ALLEGRA) 180 MG tablet Take 180 mg by mouth 2 (two) times daily.   Yes Historical Provider, MD  pantoprazole (PROTONIX) 40 MG tablet Take 1 tablet (40 mg total) by mouth 2 (two) times daily. Patient taking differently: Take 80 mg by mouth daily.  05/08/15  Yes Mauri Pole, MD  [place] Anemia Therapy   Yes Historical Provider, MD    Scheduled Meds:  Infusions:  PRN Meds:    Allergies as of 06/13/2015 - Review Complete 06/13/2015  Allergen Reaction Noted  . Sulfa antibiotics Rash 12/14/2010    Family History  Problem Relation Age of Onset  . Heart disease Brother     Congenital  . Hypertension Brother   . Diabetes Brother   . Cancer Father     stomach  . CAD Father     MI at age 2  . Heart disease Father   . Heart attack Father   . Cancer Sister     female (uterus?)  . Hypertension Mother     Social History   Social History  . Marital Status: Single    Spouse Name: N/A  .  Number of Children: N/A  . Years of Education: N/A   Occupational History  . 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      retired   Social History Main Topics  . Smoking status: Former Smoker -- 2 years    Types: Cigarettes    Quit date: 03/29/1953  . Smokeless tobacco: Never Used  . Alcohol Use: 0.0 oz/week    0 Standard drinks or equivalent per week     Comment: occasional  . Drug Use: No  . Sexual Activity: Not on file   Other Topics Concern  . Not on file   Social History Narrative   As of 03/2013. Lives alone with her small dog.  Bother and sister in law died last year.     Occupation: retired, was Information systems manager for [employer]   Edu: some college    REVIEW OF SYSTEMS: Constitutional:  No profound weakness, + DOE ENT:  No nose bleeds.  Only 2 teeth remain.  Pulm:  + stable DOE, no cough CV:  No palpitations, no LE edema.  GU: still makes small amounts of urine.  No dysuria, no hematuria GI:  Per HPI.  No dysphagia Heme:  No unusual bleeding.  + frequent purpura but no large hematomas   Transfusions:  Per HPI Neuro:  No headaches, no peripheral tingling or numbness Derm:  Tripped in her garage on Wednesday and cause skin tears on left forearm.  Neighbor is former Games developer and bandaged/wrapped this for her.  Endocrine:  No sweats or chills.  No polyuria or dysuria Immunization:  Did not inquire.  Travel:  None beyond local counties in last few months.    PHYSICAL EXAM: Vital signs in last 24  hours: Filed Vitals:   06/13/15 0800 06/13/15 0930  BP: 137/74 133/71  Pulse: 100 96  Temp:    Resp:  26   Wt Readings from Last 3 Encounters:  06/13/15 50.803 kg (112 lb)  06/05/15 51.256 kg (113 lb)  06/03/15 51.483 kg (113 lb 8 oz)    General: very pleasant elderly WF.  She is comfortable but looks chronically ill.  Head:  No swellilng.  Facial asymmetry and right droop.   Eyes:  No icterus or pallor Ears:  Slightly HOH   Nose:  No discharge Mouth:  Asymmetric, 2 remaining teeth.  Mucosa is pink/moist/clear.  Tongue mid line Neck:  No JVD, no TMG Lungs:  Clear bil.  No labored resps or cough Heart: RRR.  No MRG Abdomen:  Soft, NT, ND.  No mass.  Active BS.   Rectal: deferred   Musc/Skeltl: no joint swelling or redness.  Extremities:  No CCE.    Neurologic:  Oriented x 3.  Moves all 4 limbs.  Right facial droop Skin:  No rash or sores.  + UE purpura Tattoos:  AVG in left UE Nodes:  No cervical adenopathy   Psych:  Pleasant, cooperative. Calm.   Intake/Output from previous day:   Intake/Output this shift:    LAB RESULTS:  Recent Labs  06/13/15 0741  WBC 6.1  HGB 5.9*  HCT 20.1*  PLT 280   BMET Lab Results  Component Value Date   NA 139 06/13/2015   NA 141 04/09/2015   NA 139 10/29/2014   K 5.4* 06/13/2015   K 4.3 04/09/2015   K 4.1 10/29/2014   CL 99* 06/13/2015   CL 100* 04/09/2015   CL 94* 10/29/2014   CO2 25 06/13/2015   CO2 32 04/09/2015   CO2 30 10/29/2014   GLUCOSE 166* 06/13/2015   GLUCOSE 103* 04/09/2015   GLUCOSE 119* 10/29/2014   BUN 72* 06/13/2015   BUN 19 04/09/2015   BUN 49* 10/29/2014   CREATININE 6.70* 06/13/2015   CREATININE 3.67* 04/09/2015   CREATININE 6.48* 10/29/2014   CALCIUM 9.1 06/13/2015   CALCIUM 8.7* 04/09/2015   CALCIUM 8.8* 10/29/2014   LFT No results for input(s): PROT, ALBUMIN, AST, ALT, ALKPHOS, BILITOT, BILIDIR, IBILI in the last 72 hours. PT/INR Lab Results  Component Value Date   INR 1.00 04/09/2015   INR 1.02 09/22/2014   INR 0.93 04/28/2011   Hepatitis Panel No results for input(s): HEPBSAG, HCVAB, HEPAIGM, HEPBIGM in the last 72 hours. C-Diff No components found for: CDIFF Lipase  No results found for: LIPASE  Drugs of Abuse  No results found for: LABOPIA, COCAINSCRNUR, LABBENZ, AMPHETMU, THCU, LABBARB   RADIOLOGY STUDIES: Dg Chest 2 View  06/13/2015  CLINICAL DATA:  Dry cough.  History of hypertension and CAD. EXAM:  CHEST  2 VIEW COMPARISON:  09/22/2014; 10/23/2013; 07/24/2013; 09/14/2010 FINDINGS: Obscuration of the right heart border secondary to worsening right mid lung heterogeneous airspace opacities. Otherwise, grossly unchanged enlarged cardiac silhouette and mediastinal contours with atherosclerotic plaque within the thoracic aorta. Partially calcified bilateral hilar lymph nodes, left greater than right. There is persistent moderate elevation of the right hemidiaphragm. No pleural effusion or pneumothorax. No evidence of edema. Unchanged bones including severe (greater 75%) compression deformity involving a lower thoracic vertebral body with associated focal kyphosis this location. IMPRESSION: 1. Worsening right mid and lower lung heterogeneous airspace opacities - atelectasis versus infiltrate, progressed since the 08/2014 examination though new compared to the 07/24/2013 examination. As a right  perihilar mass could result in a similar appearance, a follow-up chest radiograph in 3 to 4 weeks after treatment is recommended to ensure resolution. 2. Similar findings of cardiomegaly without definitive evidence of edema. 3. Unchanged moderate elevation of the right hemidiaphragm. Electronically Signed   By: Sandi Mariscal M.D.   On: 06/13/2015 08:11   Ct Head Wo Contrast  06/13/2015  CLINICAL DATA:  Right-sided facial numbness and pain. EXAM: CT HEAD WITHOUT CONTRAST CT MAXILLOFACIAL WITHOUT CONTRAST TECHNIQUE: Multidetector CT imaging of the head and maxillofacial structures were performed using the standard protocol without intravenous contrast. Multiplanar CT image reconstructions of the maxillofacial structures were also generated. COMPARISON:  None. FINDINGS: CT HEAD FINDINGS Bony calvarium appears intact. Mild diffuse cortical atrophy is noted. No mass effect or midline shift is noted. Ventricular size is within normal limits. There is no evidence of mass lesion, hemorrhage or acute infarction. CT MAXILLOFACIAL  FINDINGS No evidence of fracture or other bony abnormality. Paranasal sinuses appear normal. Pterygoid plates appear normal. Globes and orbits appear normal. IMPRESSION: Mild diffuse cortical atrophy. No acute intracranial abnormality seen. No significant abnormality seen in the maxillofacial region. Electronically Signed   By: Marijo Conception, M.D.   On: 06/13/2015 09:30   Ct Maxillofacial Wo Cm  06/13/2015  CLINICAL DATA:  Right-sided facial numbness and pain. EXAM: CT HEAD WITHOUT CONTRAST CT MAXILLOFACIAL WITHOUT CONTRAST TECHNIQUE: Multidetector CT imaging of the head and maxillofacial structures were performed using the standard protocol without intravenous contrast. Multiplanar CT image reconstructions of the maxillofacial structures were also generated. COMPARISON:  None. FINDINGS: CT HEAD FINDINGS Bony calvarium appears intact. Mild diffuse cortical atrophy is noted. No mass effect or midline shift is noted. Ventricular size is within normal limits. There is no evidence of mass lesion, hemorrhage or acute infarction. CT MAXILLOFACIAL FINDINGS No evidence of fracture or other bony abnormality. Paranasal sinuses appear normal. Pterygoid plates appear normal. Globes and orbits appear normal. IMPRESSION: Mild diffuse cortical atrophy. No acute intracranial abnormality seen. No significant abnormality seen in the maxillofacial region. Electronically Signed   By: Marijo Conception, M.D.   On: 06/13/2015 09:30    ENDOSCOPIC STUDIES: Per HPI  IMPRESSION:   *  GAVE syndrome and recurrent blood loss anemia in pt with multiple occurrences of the same.  S/p multiple EGDs with ablation of gastric AVMs  *  Severe 3 vsl CAD.  High surgical risk.  After careful consideration and d/w surgeon, pt has decided against CABG.  Currently with nausea and right facial pain which is her anginal equivalance.   Troponin #1 is 0.21.   *  ESRD.  Note hyperkalemia.  HD planned today.    *  Longstanding right facial droop.       PLAN:     *  Plan EGD with ablation of AVMs once she is transfused.  ED MD ordered 2 PRBCs.  HD today.   egd tomorrow. Ok to eat solid renal diet today, NPO after midnight.    Azucena Freed  06/13/2015, 9:51 AM Pager: [phone]     ________________________________________________________________________  Velora Heckler GI MD note:  I personally examined the patient, reviewed the data and agree with the assessment and plan described above.  Planning on in patient EGD, likely APC for GAVE tomorrow after blood transfusions and HD today.   Owens Loffler, MD [facility] Pager [phone]

## 2015-06-13 NOTE — Progress Notes (Addendum)
06/13/2015 6:37 PM  Pt admitted to 6E03 from HD.  Pt fully alert and oriented, irritable, and independent.  Pt wears glasses and has hearing aids bilaterally.  Full assessment to EPIC.  Vitals stable.  Pt placed on telemetry box #3, confirmed with CCMD.  Skin overall is very dry, with some ecchymosis noted to her BUE, and a skin tear noted to her left forearm.  Pt is a moderate fall risk with a score of 8.  Pt was educated on fall risk tool, her score and corresponding interventions, to which she verbalized understanding.  She does not meet criteria for a bed alarm, but asked "not to be placed on one anyway".  She does endorse a fall within the past couple of weeks where she tripped and fell into her cupboard, leading to the skin tear.  However, she denies any other issues with falls over the past six months.  She does state that if she needs help she will be sure to notify staff.  Pt is from home alone where she is fully independent, no use of DME and still drives.  Pt oriented to room/unit, and was instructed on how to utilize the call bell, to which she verbalized understanding.  Will continue to monitor patient. Princella Pellegrini

## 2015-06-13 NOTE — Progress Notes (Signed)
Hemo- Pt given [place] sandwich and sprite. Spoke with caregivers on pt personal cell phone. For any updates on pt status please call Elanor at [phone], or [phone], requested note be placed in chart. Will update her when pt gets bed.

## 2015-06-13 NOTE — ED Provider Notes (Signed)
CSN: [id number]     Arrival date & time 06/13/15  0448 History   First MD Initiated Contact with Patient 06/13/15 [phone]     Chief Complaint  Patient presents with  . Facial Pain  . Nausea     (Consider location/radiation/quality/duration/timing/severity/associated sxs/prior Treatment) HPI Comments: Patient awoke around 1:30 AM with pain to her right face associated with nausea and dry heaves. She describes the pain as dull and achy and unrelenting. Nothing makes it better or worse. She has never had this pain before. She had a fall a few days ago but did not strike her head or face. She is a is a dialysis patient due for dialysis today. She has not missed any sessions. She does endorse some shortness of breath but no chest pain. 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 She is not on any blood thinners. Denies headache or visual change.  Her vision is poor at baseline and unchanged today. She has no lateral movement of her eyes at baseline. She has no new neurological deficits and has a chronic right facial droop.  The history is provided by the patient. The history is limited by the condition of the patient.    Past Medical History  Diagnosis Date  . Hyperlipidemia   . Hypertension   . Anxiety   . Facial droop 1935    acquired during forceps delivery, some residual R visual loss  . Hx of breast cancer 2004  . Arthritis     mild in lower back (Drs Posey Pronto and Joelyn Oms)  . GERD (gastroesophageal reflux disease)   . Bradycardia 2014    a. Noted 06/2012.  Marland Kitchen Hypotension     a. Associated w/ dialysis.  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 [name])  . Osteoporosis 11/2013    DEXA -3.4 radius  . GAVE (gastric antral vascular ectasia) 08/2014    s/p EGD with APC  . Anemia in chronic kidney disease     IV iron infusion and anemia from GAVE blood loss.  . End stage renal disease on dialysis [facility])     HD  M,W,F [place] AVF now LUE AVF  . Breast cancer ([place])     Left Breast 12 yrs. ago  . Hearing loss     Bilateral hearing aids  . Sight impaired     Left: wears contact. Right: diminished peripheral vision  . Cardiac murmur     a. thought due to AVF (2D echo 06/2012 without significant valvular disease).   Marland Kitchen CAD (coronary artery disease), native coronary artery     3 vessel CAD by cath 04/2015   Past Surgical History  Procedure Laterality Date  . Cataract extraction  1981  . Av fistula placement  07/20/10    Right brachiocephalic AVF  . Umbilical hernia repair  2011  . Dexa  2013    [facility]  . US echocardiography  06/2012    LVH, EF 65%, grade 1 diastol dysfunction, mod dliated LAD  . Eye surgery  1966    regular cataract  . Breast lumpectomy Left   . Breast biopsy Left   . Bascilic vein transposition Left 07/24/2013    Procedure: BASILIC VEIN TRANSPOSITION;  Surgeon: [name], MD;  Location: [facility];  Service: Vascular;  Laterality: Left;  . Shuntogram N/A 04/16/2011    Procedure: Earney Mallet;  Surgeon: Jessy Oto  Fields, MD;  Location: [facility] CATH LAB;  Service: Cardiovascular;  Laterality: N/A;  . Shuntogram Right 07/05/2013    Procedure: FISTULOGRAM;  Surgeon: [name], MD;  Location: [facility] CATH LAB;  Service: Cardiovascular;  Laterality: Right;  . Esophagogastroduodenoscopy N/A 04/09/2014    Procedure: ESOPHAGOGASTRODUODENOSCOPY (EGD);  Surgeon: [name], MD;  Location: [facility];  Service: Endoscopy;  Laterality: N/A;  . Peripheral vascular catheterization N/A 08/15/2014    Procedure: A/V Shuntogram/Fistulagram;  Surgeon: [name], MD;  Location: [place] CV LAB;  Service: Cardiovascular;  Laterality: N/A;  . Esophagogastroduodenoscopy N/A 09/23/2014    Procedure: ESOPHAGOGASTRODUODENOSCOPY (EGD);  Surgeon: [name], MD;  Location: [place];  Service: Endoscopy;  Laterality: N/A;  . Hernia repair    . Esophagogastroduodenoscopy  (egd) with propofol N/A 11/12/2014    Procedure: ESOPHAGOGASTRODUODENOSCOPY (EGD) WITH PROPOFOL;  Surgeon: [name], MD;  Location: [facility] ENDOSCOPY;  Service: Endoscopy;  Laterality: N/A;  . Hot hemostasis N/A 11/12/2014    Procedure: HOT HEMOSTASIS (ARGON PLASMA COAGULATION/BICAP);  Surgeon: [name], MD;  Location: [facility];  Service: Endoscopy;  Laterality: N/A;  . Esophagogastroduodenoscopy N/A 12/26/2014    neg H pylori, benign biopsies; ESOPHAGOGASTRODUODENOSCOPY (EGD);  Surgeon: [name], MD  . Cardiac catheterization N/A 04/10/2015    Procedure: Left Heart Cath and Coronary Angiography;  Surgeon: [name], MD;  Location: [place] CV LAB;  Service: Cardiovascular;  Laterality: N/A;  . Peripheral vascular catheterization N/A 04/28/2015    Procedure: A/V Shuntogram/Fistulagram;  Surgeon: [name], MD;  Location: [place] CV LAB;  Service: Cardiovascular;  Laterality: N/A;  . Peripheral vascular catheterization Left 04/28/2015    Procedure: A/V Shunt Intervention;  Surgeon: [name], MD;  Location: [place] CV LAB;  Service: Cardiovascular;  Laterality: Left;   Family History  Problem Relation Age of Onset  . Heart disease Brother     Congenital  . Hypertension Brother   . Diabetes Brother   . Cancer Father     stomach  . CAD Father     MI at age 59  . Heart disease Father   . Heart attack Father   . Cancer Sister     female (uterus?)  . Hypertension Mother    Social History  Substance Use Topics  . Smoking status: Former Smoker -- 2 years    Types: Cigarettes    Quit date: 03/29/1953  . Smokeless tobacco: Never Used  . Alcohol Use: 0.0 oz/week    0 Standard drinks or equivalent per week     Comment: occasional   OB History    No data available     Review of Systems  Constitutional: Negative for fever, activity change and appetite change.  HENT: Negative for dental problem.   Eyes: Negative for visual disturbance.   Respiratory: Positive for shortness of breath. Negative for cough and chest tightness.   Cardiovascular: Negative for chest pain.  Gastrointestinal: Positive for nausea. Negative for vomiting and abdominal pain.  Genitourinary: Negative for dysuria, hematuria, vaginal bleeding and vaginal discharge.  Musculoskeletal: Negative for myalgias and arthralgias.  Skin: Negative for rash.  Neurological: Negative for dizziness, weakness and headaches.  A complete 10 system review of systems was obtained and all systems are negative except as noted in the HPI and PMH.      Allergies  Sulfa antibiotics  Home Medications   Prior to Admission medications   Medication Sig Start Date End Date Taking?  Authorizing Provider  acetaminophen (TYLENOL) 500 MG tablet Take 1,000 mg by mouth every 6 (six) hours as needed for moderate pain.   Yes Historical Provider, MD  amLODipine (NORVASC) 2.5 MG tablet Take 2.5 mg by mouth at bedtime.   Yes Historical Provider, MD  Artificial Tear Ointment (REFRESH P.M. OP) Place 1 application into the right eye daily.   Yes Historical Provider, MD  aspirin EC 81 MG tablet Take 1 tablet (81 mg total) by mouth daily. 03/06/15  Yes Lelon Perla, MD  atorvastatin (LIPITOR) 80 MG tablet Take 1 tablet (80 mg total) by mouth daily. 05/06/15  Yes Lelon Perla, MD  B Complex-C-Folic Acid (RENA-VITE PO) Take 1 tablet by mouth daily with breakfast.    Yes Historical Provider, MD  Calcium Carbonate Antacid (TUMS ULTRA 1000 PO) Take 3,000 mg by mouth 3 (three) times daily with meals.    Yes Historical Provider, MD  cholecalciferol (VITAMIN D) 1000 UNITS tablet Take 3,000 Units by mouth every Monday, Wednesday, and Friday.    Yes Historical Provider, MD  fexofenadine (ALLEGRA) 180 MG tablet Take 180 mg by mouth 2 (two) times daily.   Yes Historical Provider, MD  pantoprazole (PROTONIX) 40 MG tablet Take 1 tablet (40 mg total) by mouth 2 (two) times daily. Patient taking differently:  Take 80 mg by mouth daily.  05/08/15  Yes Mauri Pole, MD  PRESCRIPTION MEDICATION [facility] Anemia Therapy   Yes Historical Provider, MD   BP 140/65 mmHg  Pulse 94  Temp(Src) 98.1 F (36.7 C) (Oral)  Resp 20  Ht 4\' 11"  (1.499 m)  Wt 112 lb (50.803 kg)  BMI 22.61 kg/m2  SpO2 97% Physical Exam  Constitutional: She is oriented to person, place, and time. She appears well-developed and well-nourished. No distress.  HENT:  Head: Normocephalic and atraumatic.  Mouth/Throat: Oropharynx is clear and moist. No oropharyngeal exudate.  R maxillary sinus tenderness R facial droop at baseline. Unable to track eyes laterally (baseline) Mostly edentulous. No tenderness to gingiva or abscess  Eyes: Conjunctivae and EOM are normal. Pupils are equal, round, and reactive to light.  Neck: Normal range of motion. Neck supple.  No meningismus.  Cardiovascular: Normal rate, regular rhythm, normal heart sounds and intact distal pulses.   No murmur heard. Pulmonary/Chest: Effort normal. No respiratory distress. She has rales.  Dyspneic with conversation  Abdominal: Soft. There is no tenderness. There is no rebound and no guarding.  Musculoskeletal: Normal range of motion. She exhibits no edema or tenderness.  Neurological: She is alert and oriented to person, place, and time. No cranial nerve deficit. She exhibits normal muscle tone. Coordination normal.  No ataxia on finger to nose bilaterally. No pronator drift. 5/5 strength throughout. CN 2-12 intact.Equal grip strength. Sensation intact.   Skin: Skin is warm.  Psychiatric: She has a normal mood and affect. Her behavior is normal.  Nursing note and vitals reviewed.   ED Course  Procedures (including critical care time) Labs Review Labs Reviewed  CBC WITH DIFFERENTIAL/PLATELET - Abnormal; Notable for the following:    RBC 2.10 (*)    Hemoglobin 5.9 (*)    HCT 20.1 (*)    MCHC 29.4 (*)    RDW 17.2 (*)    All other components within normal  limits  BASIC METABOLIC PANEL - Abnormal; Notable for the following:    Potassium 5.4 (*)    Chloride 99 (*)    Glucose, Bld 166 (*)    BUN 72 (*)  Creatinine, Ser 6.70 (*)    GFR calc non Af Amer 5 (*)    GFR calc Af Amer 6 (*)    All other components within normal limits  TROPONIN I - Abnormal; Notable for the following:    Troponin I 0.21 (*)    All other components within normal limits  CBG MONITORING, ED - Abnormal; Notable for the following:    Glucose-Capillary 194 (*)    All other components within normal limits  POC OCCULT BLOOD, ED - Abnormal; Notable for the following:    Fecal Occult Bld POSITIVE (*)    All other components within normal limits  I-STAT CG4 LACTIC ACID, ED - Abnormal; Notable for the following:    Lactic Acid, Venous 3.26 (*)    All other components within normal limits  I-STAT ARTERIAL BLOOD GAS, ED - Abnormal; Notable for the following:    pH, Arterial 7.463 (*)    pO2, Arterial 65.0 (*)    Bicarbonate 26.5 (*)    Acid-Base Excess 3.0 (*)    All other components within normal limits  TROPONIN I  TROPONIN I  HEPATITIS B SURFACE ANTIGEN  RENAL FUNCTION PANEL  CBC  TYPE AND SCREEN  PREPARE RBC (CROSSMATCH)    Imaging Review Dg Chest 2 View  06/13/2015  CLINICAL DATA:  Dry cough.  History of hypertension and CAD. EXAM: CHEST  2 VIEW COMPARISON:  09/22/2014; 10/23/2013; 07/24/2013; 09/14/2010 FINDINGS: Obscuration of the right heart border secondary to worsening right mid lung heterogeneous airspace opacities. Otherwise, grossly unchanged enlarged cardiac silhouette and mediastinal contours with atherosclerotic plaque within the thoracic aorta. Partially calcified bilateral hilar lymph nodes, left greater than right. There is persistent moderate elevation of the right hemidiaphragm. No pleural effusion or pneumothorax. No evidence of edema. Unchanged bones including severe (greater 75%) compression deformity involving a lower thoracic vertebral body  with associated focal kyphosis this location. IMPRESSION: 1. Worsening right mid and lower lung heterogeneous airspace opacities - atelectasis versus infiltrate, progressed since the 08/2014 examination though new compared to the 07/24/2013 examination. As a right perihilar mass could result in a similar appearance, a follow-up chest radiograph in 3 to 4 weeks after treatment is recommended to ensure resolution. 2. Similar findings of cardiomegaly without definitive evidence of edema. 3. Unchanged moderate elevation of the right hemidiaphragm. Electronically Signed   By: Sandi Mariscal M.D.   On: 06/13/2015 08:11   Ct Head Wo Contrast  06/13/2015  CLINICAL DATA:  Right-sided facial numbness and pain. EXAM: CT HEAD WITHOUT CONTRAST CT MAXILLOFACIAL WITHOUT CONTRAST TECHNIQUE: Multidetector CT imaging of the head and maxillofacial structures were performed using the standard protocol without intravenous contrast. Multiplanar CT image reconstructions of the maxillofacial structures were also generated. COMPARISON:  None. FINDINGS: CT HEAD FINDINGS Bony calvarium appears intact. Mild diffuse cortical atrophy is noted. No mass effect or midline shift is noted. Ventricular size is within normal limits. There is no evidence of mass lesion, hemorrhage or acute infarction. CT MAXILLOFACIAL FINDINGS No evidence of fracture or other bony abnormality. Paranasal sinuses appear normal. Pterygoid plates appear normal. Globes and orbits appear normal. IMPRESSION: Mild diffuse cortical atrophy. No acute intracranial abnormality seen. No significant abnormality seen in the maxillofacial region. Electronically Signed   By: Marijo Conception, M.D.   On: 06/13/2015 09:30   Ct Maxillofacial Wo Cm  06/13/2015  CLINICAL DATA:  Right-sided facial numbness and pain. EXAM: CT HEAD WITHOUT CONTRAST CT MAXILLOFACIAL WITHOUT CONTRAST TECHNIQUE: Multidetector CT imaging of the  head and maxillofacial structures were performed using the standard  protocol without intravenous contrast. Multiplanar CT image reconstructions of the maxillofacial structures were also generated. COMPARISON:  None. FINDINGS: CT HEAD FINDINGS Bony calvarium appears intact. Mild diffuse cortical atrophy is noted. No mass effect or midline shift is noted. Ventricular size is within normal limits. There is no evidence of mass lesion, hemorrhage or acute infarction. CT MAXILLOFACIAL FINDINGS No evidence of fracture or other bony abnormality. Paranasal sinuses appear normal. Pterygoid plates appear normal. Globes and orbits appear normal. IMPRESSION: Mild diffuse cortical atrophy. No acute intracranial abnormality seen. No significant abnormality seen in the maxillofacial region. Electronically Signed   By: Marijo Conception, M.D.   On: 06/13/2015 09:30   I have personally reviewed and evaluated these images and lab results as part of my medical decision-making.   EKG Interpretation   Date/Time:  Friday June 13 2015 07:26:30 EDT Ventricular Rate:  96 PR Interval:  111 QRS Duration: 100 QT Interval:  384 QTC Calculation: 485 R Axis:   9 Text Interpretation:  Sinus rhythm Borderline short PR interval Low  voltage, extremity and precordial leads Repol abnrm, severe global  ischemia (LM/MVD) diffuse ST depression ST elevation aVR Confirmed by  Wyvonnia Dusky  MD, Bailey Kolbe [id number]) on 06/13/2015 8:17:54 AM      MDM   Final diagnoses:  Gastrointestinal hemorrhage, unspecified gastritis, unspecified gastrointestinal hemorrhage type  Facial pain  EKG abnormality   Constant R facial pain since this morning with nausea and dry heaves. No Chest pain.  EKG with ST elevation avR and ST depression inferior and laterally. Stable R facial droop. No other neuro deficits.   LHC   Severe three-vessel coronary disease involving the mid LAD, mid to distal RCA, and mid circumflex. Distal vessels are Large enough to graft.  Normal left ventricular end-diastolic pressure.  Noninvasive  EF of 50% in Mid to late 2016.  Successful Angio-Seal used for hemostasis.  Patient was seen by cardiothoracic surgery and thought to be a poor candidate for bypass. Her EKG today was discussed with Dr. Irish Lack  who agreed she is not a candidate for emergent catheterization given her ongoing issues and probable GI bleeding. Troponin elevated.  ? Whether facial pain is anginal equivalent.  Hemoglobin 5.9 from baseline of 8. Hemoccult is positive. Patient with known gastric AVMs. Discussed with Adline Peals of GI. We'll need to be judicious with transfusion given that she was edematous on her chest x-ray. IV PPI.  D/w Dr. Florene Glen of nephrology who will arrange for dialysis. Worsening opacities on CXR but no distress or hypoxia.  Admission d/w Lissa Merlin NP. Vitals remained stable in the ED. Stepdown admission and dialysis planned.  Cardiology, GI, nephrology to consult.   CRITICAL CARE Performed by: Ezequiel Essex Total critical care time: 45 minutes Critical care time was exclusive of separately billable procedures and treating other patients. Critical care was necessary to treat or prevent imminent or life-threatening deterioration. Critical care was time spent personally by me on the following activities: development of treatment plan with patient and/or surrogate as well as nursing, discussions with consultants, evaluation of patient's response to treatment, examination of patient, obtaining history from patient or surrogate, ordering and performing treatments and interventions, ordering and review of laboratory studies, ordering and review of radiographic studies, pulse oximetry and re-evaluation of patient's condition.   Ezequiel Essex, MD 06/13/15 [phone]

## 2015-06-13 NOTE — Progress Notes (Signed)
HPI: FU bradycardia and CAD. She is dialysis dependent. Holter monitor in April of 2014 showed sinus rhythm to sinus bradycardia with PACs and PVCs. Echocardiogram June 2016 showed ejection fraction of 50% with inferior lateral hypokinesis. There was mild mitral regurgitation, biatrial enlargement and mild right ventricular enlargement. Nuclear study repeated September 2016. Ejection fraction 47%. There was an anterior lateral perfusion defect consistent with prior infarct and peri-infarct ischemia. Cardiac catheterization January 2017 showed a 95% distal right coronary lesion, 70% proximal to mid, 85% acute marginal, 99% ramus, 80% circumflex, 85% first marginal, 75% second diagonal and 80% mid LAD. PCI was felt to be high risk. Dr Roxan Hockey saw pt and felt she would be very high risk for surgery; she elected medical therapy. Carotid Dopplers February 2017 showed 1-39% bilateral stenosis. Patient recently admitted with chart pain and nausea. Hemoglobin decreased to 5.9. Patient noted to have a troponin increase felt secondary to global ischemia from anemia. Echocardiogram March 2017 showed normal LV function, Moderate mitral regurgitation, mild to moderate left atrial enlargement. Patient was transfused with improvement in hemoglobin. Since last seen She denies dyspnea, chest pain, palpitations or syncope.  Current Outpatient Prescriptions  Medication Sig Dispense Refill  . acetaminophen (TYLENOL) 500 MG tablet Take 1,000 mg by mouth every 6 (six) hours as needed for moderate pain.    Marland Kitchen amLODipine (NORVASC) 2.5 MG tablet Take 2.5 mg by mouth at bedtime.    . Artificial Tear Ointment (REFRESH P.M. OP) Place 1 application into the right eye daily.    Marland Kitchen aspirin EC 81 MG tablet Take 1 tablet (81 mg total) by mouth daily. 90 tablet 3  . atorvastatin (LIPITOR) 80 MG tablet Take 1 tablet (80 mg total) by mouth daily. 90 tablet 3  . B Complex-C-Folic Acid (RENA-VITE PO) Take 1 tablet by mouth daily  with breakfast.     . Calcium Carbonate Antacid (TUMS ULTRA 1000 PO) Take 3,000 mg by mouth 3 (three) times daily with meals.     . cholecalciferol (VITAMIN D) 1000 UNITS tablet Take 3,000 Units by mouth every Monday, Wednesday, and Friday.     . fexofenadine (ALLEGRA) 180 MG tablet Take 180 mg by mouth 2 (two) times daily.    . pantoprazole (PROTONIX) 40 MG tablet Take 1 tablet (40 mg total) by mouth 2 (two) times daily. (Patient taking differently: Take 80 mg by mouth daily. ) 60 tablet 5  . PRESCRIPTION MEDICATION [facility] Anemia Therapy    . calcitRIOL (ROCALTROL) 0.25 MCG capsule Take 5 capsules (1.25 mcg total) by mouth every Monday, Wednesday, and Friday with hemodialysis. (Patient not taking: Reported on 06/20/2015) 30 capsule 0  . metoprolol tartrate (LOPRESSOR) 25 MG tablet Take 0.5 tablets (12.5 mg total) by mouth 2 (two) times daily. (Patient not taking: Reported on 06/20/2015) 60 tablet 1   No current facility-administered medications for this visit.     Past Medical History  Diagnosis Date  . Hyperlipidemia   . Hypertension   . Anxiety   . Facial droop 1935    acquired during forceps delivery, some residual R visual loss  . Hx of breast cancer 2004  . Arthritis     mild in lower back (Drs Posey Pronto and Joelyn Oms)  . GERD (gastroesophageal reflux disease)   . Bradycardia 2014    a. Noted 06/2012.  Marland Kitchen Hypotension     a. Associated w/ dialysis.  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 [name])  .  Osteoporosis 11/2013    DEXA -3.4 radius  . GAVE (gastric antral vascular ectasia) 08/2014    s/p EGD with APC  . Anemia in chronic kidney disease     IV iron infusion and anemia from GAVE blood loss.  . End stage renal disease on dialysis [facility])     HD M,W,F [place] AVF now LUE AVF  . Breast cancer ([place])     Left Breast 12 yrs. ago  . Hearing loss     Bilateral hearing aids  . Sight impaired     Left: wears contact. Right: diminished peripheral  vision  . Cardiac murmur     a. thought due to AVF (2D echo 06/2012 without significant valvular disease).   Marland Kitchen CAD (coronary artery disease), native coronary artery     3 vessel CAD by cath 04/2015    Past Surgical History  Procedure Laterality Date  . Cataract extraction  1981  . Av fistula placement  07/20/10    Right brachiocephalic AVF  . Umbilical hernia repair  2011  . Dexa  2013    [facility]  . US echocardiography  06/2012    LVH, EF 65%, grade 1 diastol dysfunction, mod dliated LAD  . Eye surgery  1966    regular cataract  . Breast lumpectomy Left   . Breast biopsy Left   . Bascilic vein transposition Left 07/24/2013    Procedure: BASILIC VEIN TRANSPOSITION;  Surgeon: [name], MD;  Location: [place];  Service: Vascular;  Laterality: Left;  . Shuntogram N/A 04/16/2011    Procedure: [place] Mal[place];  Surgeon: [name], MD;  Location[facility] C[facility] LAB;  Service: Cardiovascular;  Laterality: N/A;  . Shuntogram Right 07/05/2013    Procedure: FISTULOGRAM;  Surgeon: [name], MD;  Location: [facility] CATH LAB;  Service: Cardiovascular;  Laterality: Right;  . Esophagogastroduodenoscopy N/A 04/09/2014    Procedure: ESOPHAGOGASTRODUODENOSCOPY (EGD);  Surgeon: [name], MD;  Location: [place];  Service: Endoscopy;  Laterality: N/A;  . Peripheral vascular catheterization N/A 08/15/2014    Procedure: A/V Shuntogram/Fistulagram;  Surgeon: [name], MD;  Location: [place] CV LAB;  Service: Cardiovascular;  Laterality: N/A;  . Esophagogastroduodenoscopy N/A 09/23/2014    Procedure: ESOPHAGOGASTRODUODENOSCOPY (EGD);  Surgeon: [name], MD;  Location: [place];  Service: Endoscopy;  Laterality: N/A;  . Hernia repair    . Esophagogastroduodenoscopy (egd) with propofol N/A 11/12/2014    Procedure: ESOPHAGOGASTRODUODENOSCOPY (EGD) WITH PROPOFOL;  Surgeon: [name], MD;  Location: [facility] ENDOSCOPY;  Service: Endoscopy;  Laterality: N/A;  . Hot hemostasis N/A 11/12/2014      Procedure: HOT HEMOSTASIS (ARGON PLASMA COAGULATION/BICAP);  Surgeon: [name], MD;  Location: [facility];  Service: Endoscopy;  Laterality: N/A;  . Esophagogastroduodenoscopy N/A 12/26/2014    neg H pylori, benign biopsies; ESOPHAGOGASTRODUODENOSCOPY (EGD);  Surgeon: [name], MD  . Cardiac catheterization N/A 04/10/2015    Procedure: Left Heart Cath and Coronary Angiography;  Surgeon: [name], MD;  Location: [place] CV LAB;  Service: Cardiovascular;  Laterality: N/A;  . Peripheral vascular catheterization N/A 04/28/2015    Procedure: A/V Shuntogram/Fistulagram;  Surgeon: [name], MD;  Location: [place] CV LAB;  Service: Cardiovascular;  Laterality: N/A;  . Peripheral vascular catheterization Left 04/28/2015    Procedure: A/V Shunt Intervention;  Surgeon: [name], MD;  Location: [place] CV LAB;  Service: Cardiovascular;  Laterality: Left;  . Esophagogastroduodenoscopy N/A 06/15/2015  Procedure: ESOPHAGOGASTRODUODENOSCOPY (EGD);  Surgeon: Milus Banister, MD;  Location: [place];  Service: Endoscopy;  Laterality: N/A;    Social History   Social History  . Marital Status: Single    Spouse Name: N/A  . Number of Children: N/A  . Years of Education: N/A   Occupational History  . 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 Smoking status: Former Smoker -- 2 years    Types: Cigarettes    Quit date: 03/29/1953  . Smokeless tobacco: Never Used  . Alcohol Use: 0.0 oz/week    0 Standard drinks or equivalent per week     Comment: occasional  . Drug Use: No  . Sexual Activity: Not on file   Other Topics Concern  . Not on file   Social History Narrative   As of 03/2013. Lives alone with her small dog.  Bother and sister in law died last year.     Occupation: retired, was Information systems manager for [employer]   Edu: some college    Family History  Problem Relation Age  of Onset  . Heart disease Brother     Congenital  . Hypertension Brother   . Diabetes Brother   . Cancer Father     stomach  . CAD Father     MI at age 68  . Heart disease Father   . Heart attack Father   . Cancer Sister     female (uterus?)  . Hypertension Mother     ROS: no fevers or chills, productive cough, hemoptysis, dysphasia, odynophagia, melena, hematochezia, dysuria, hematuria, rash, seizure activity, orthopnea, PND, pedal edema, claudication. Remaining systems are negative.  Physical Exam: Well-developed well-nourished in no acute distress.  Skin is warm and dry.  HEENT Significant for right facial droop from congenital problem Neck is supple.  Chest is clear to auscultation with normal expansion.  Cardiovascular exam is regular rate and rhythm. 2/6 systolic murmur Abdominal exam nontender or distended. No masses palpated. Extremities show no edema. neuro grossly intact

## 2015-06-13 NOTE — Progress Notes (Signed)
Notified pt has a bed on 6E. Will call report and transfer.

## 2015-06-13 NOTE — Consult Note (Signed)
CARDIOLOGY CONSULT NOTE   Patient ID: LAHNI NEIFERT MRN: [id number] DOB/AGE: 05-24-1931 82 y.o.  Admit date: 06/13/2015  Primary Physician   [name], MD Primary Cardiologist: Dr. Stanford Breed Reason for Consultation: chest pain, abnormal tropinin  HPI: Ms. Hoxworth is a 82 year old female with a PMH of HL, HTN, lifelong facial droop, ESRD on HD, severe multi vessel CAD revealed on cath of January of this year.  After talking with CVTS, she declined CABG.  She also has a history of GAVE syndrome, follows with GI.    This morning she developed right jaw pain which is her anginal equivalent.  She has never had classic angina symptoms.  She was nauseous and had dry heaving, and pain lasted over 2 hours.  She came to ED, did not take any SL Nitro.    She was hospitalized in June of 2016 with acute anemia and had elevated troponin.  She was followed closely outpatient by cardiology, she had increasing dyspnea and the decision was made to cath her.  She had cath in Jan. Of 2017, report below.   Her EKG shows NSR with diffuse ST depression. Her Hgb is 5.9 today, GI has seen her and plans to do EGD with ablation of AVM's tomorrow and transfuse 2 PRBC's.     Currently, she is in HD, still having jaw pain.  She says the pain is less intense than when it began.  Denies SOB, diaphoresis.    Past Medical History  Diagnosis Date  . Hyperlipidemia   . Hypertension   . Anxiety   . Facial droop 1935    acquired during forceps delivery, some residual R visual loss  . Hx of breast cancer 2004  . Arthritis     mild in lower back (Drs Posey Pronto and Joelyn Oms)  . GERD (gastroesophageal reflux disease)   . Bradycardia 2014    a. Noted 06/2012.  Marland Kitchen Hypotension     a. Associated w/ dialysis.  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 [name])  . Osteoporosis 11/2013    DEXA -3.4 radius  . GAVE (gastric antral vascular ectasia) 08/2014    s/p EGD with APC  . Anemia in chronic  kidney disease     IV iron infusion and anemia from GAVE blood loss.  . End stage renal disease on dialysis [facility])     HD M,W,F [employer] AVF now LUE AVF  . Breast cancer ([place])     Left Breast 12 yrs. ago  . Hearing loss     Bilateral hearing aids  . Sight impaired     Left: wears contact. Right: diminished peripheral vision  . Cardiac murmur     a. thought due to AVF (2D echo 06/2012 without significant valvular disease).   Marland Kitchen CAD (coronary artery disease), native coronary artery     3 vessel CAD by cath 04/2015     Past Surgical History  Procedure Laterality Date  . Cataract extraction  1981  . Av fistula placement  07/20/10    Right brachiocephalic AVF  . Umbilical hernia repair  2011  . Dexa  2013    [facility]  . US echocardiography  06/2012    LVH, EF 65%, grade 1 diastol dysfunction, mod dliated LAD  . Eye surgery  1966    regular cataract  . Breast lumpectomy Left   . Breast biopsy Left   . Bascilic vein transposition Left  07/24/2013    Procedure: BASILIC VEIN TRANSPOSITION;  Surgeon: [name], MD;  Location: [place];  Service: Vascular;  Laterality: Left;  . Shuntogram N/A 04/16/2011    Procedure: Earney Mallet;  Surgeon: [name], MD;  Location: [facility] CATH LAB;  Service: Cardiovascular;  Laterality: N/A;  . Shuntogram Right 07/05/2013    Procedure: FISTULOGRAM;  Surgeon: [name], MD;  Location: [facility] CATH LAB;  Service: Cardiovascular;  Laterality: Right;  . Esophagogastroduodenoscopy N/A 04/09/2014    Procedure: ESOPHAGOGASTRODUODENOSCOPY (EGD);  Surgeon: [name], MD;  Location: [place];  Service: Endoscopy;  Laterality: N/A;  . Peripheral vascular catheterization N/A 08/15/2014    Procedure: A/V Shuntogram/Fistulagram;  Surgeon: [name], MD;  Location: [place] CV LAB;  Service: Cardiovascular;  Laterality: N/A;  . Esophagogastroduodenoscopy N/A 09/23/2014    Procedure: ESOPHAGOGASTRODUODENOSCOPY (EGD);  Surgeon: [name], MD;  Location: [place];  Service: Endoscopy;  Laterality: N/A;  . Hernia repair    . Esophagogastroduodenoscopy (egd) with propofol N/A 11/12/2014    Procedure: ESOPHAGOGASTRODUODENOSCOPY (EGD) WITH PROPOFOL;  Surgeon: [name], MD;  Location: [facility] ENDOSCOPY;  Service: Endoscopy;  Laterality: N/A;  . Hot hemostasis N/A 11/12/2014    Procedure: HOT HEMOSTASIS (ARGON PLASMA COAGULATION/BICAP);  Surgeon: [name], MD;  Location: [facility];  Service: Endoscopy;  Laterality: N/A;  . Esophagogastroduodenoscopy N/A 12/26/2014    neg H pylori, benign biopsies; ESOPHAGOGASTRODUODENOSCOPY (EGD);  Surgeon: [name], MD  . Cardiac catheterization N/A 04/10/2015    Procedure: Left Heart Cath and Coronary Angiography;  Surgeon: [name], MD;  Location: [place] CV LAB;  Service: Cardiovascular;  Laterality: N/A;  . Peripheral vascular catheterization N/A 04/28/2015    Procedure: A/V Shuntogram/Fistulagram;  Surgeon: [name], MD;  Location: [place] CV LAB;  Service: Cardiovascular;  Laterality: N/A;  . Peripheral vascular catheterization Left 04/28/2015    Procedure: A/V Shunt Intervention;  Surgeon: [name], MD;  Location: [place] CV LAB;  Service: Cardiovascular;  Laterality: Left;    Allergies  Allergen Reactions  . Sulfa Antibiotics Rash    I have reviewed the patient's current medications . calcitRIOL  1.25 mcg Oral Q M,W,F-HD  . [START ON 06/18/2015] darbepoetin (ARANESP) injection - DIALYSIS  200 mcg Intravenous Q Wed-HD  . ferric gluconate (FERRLECIT/NULECIT) IV  250 mg Intravenous Q M,W,F-HD   . sodium chloride    . sodium chloride     sodium chloride, sodium chloride, alteplase, heparin, lidocaine (PF), lidocaine-prilocaine, pentafluoroprop-tetrafluoroeth  Prior to Admission medications   Medication Sig Start Date End Date Taking? Authorizing Provider  acetaminophen (TYLENOL) 500 MG tablet Take 1,000 mg by mouth every 6 (six) hours  as needed for moderate pain.   Yes Historical Provider, MD  amLODipine (NORVASC) 2.5 MG tablet Take 2.5 mg by mouth at bedtime.   Yes Historical Provider, MD  Artificial Tear Ointment (REFRESH P.M. OP) Place 1 application into the right eye daily.   Yes Historical Provider, MD  aspirin EC 81 MG tablet Take 1 tablet (81 mg total) by mouth daily. 03/06/15  Yes Lelon Perla, MD  atorvastatin (LIPITOR) 80 MG tablet Take 1 tablet (80 mg total) by mouth daily. 05/06/15  Yes Lelon Perla, MD  B Complex-C-Folic Acid (RENA-VITE PO) Take 1 tablet by mouth daily with breakfast.    Yes Historical Provider, MD  Calcium Carbonate Antacid (TUMS ULTRA 1000 PO) Take 3,000 mg by mouth 3 (three) times daily with  meals.    Yes Historical Provider, MD  cholecalciferol (VITAMIN D) 1000 UNITS tablet Take 3,000 Units by mouth every Monday, Wednesday, and Friday.    Yes Historical Provider, MD  fexofenadine (ALLEGRA) 180 MG tablet Take 180 mg by mouth 2 (two) times daily.   Yes Historical Provider, MD  pantoprazole (PROTONIX) 40 MG tablet Take 1 tablet (40 mg total) by mouth 2 (two) times daily. Patient taking differently: Take 80 mg by mouth daily.  05/08/15  Yes Mauri Pole, MD  PRESCRIPTION MEDICATION [facility] Anemia Therapy   Yes Historical Provider, MD     Social History   Social History  . Marital Status: Single    Spouse Name: N/A  . Number of Children: N/A  . Years of Education: N/A   Occupational History  . 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      retired   Social History Main Topics  . Smoking status: Former Smoker -- 2 years    Types: Cigarettes    Quit date: 03/29/1953  . Smokeless tobacco: Never Used  . Alcohol Use: 0.0 oz/week    0 Standard drinks or equivalent per week     Comment: occasional  . Drug Use: No  . Sexual Activity: Not on file   Other Topics Concern  . Not on file   Social History Narrative   As of 03/2013. Lives alone with her small dog.   Bother and sister in law died last year.     Occupation: retired, was Information systems manager for [employer]   Edu: some college    Family Status  Relation Status Death Age  . Father Deceased   . Mother Deceased    Family History  Problem Relation Age of Onset  . Heart disease Brother     Congenital  . Hypertension Brother   . Diabetes Brother   . Cancer Father     stomach  . CAD Father     MI at age 44  . Heart disease Father   . Heart attack Father   . Cancer Sister     female (uterus?)  . Hypertension Mother      ROS:  Full 14 point review of systems complete and found to be negative unless listed above.  Physical Exam: Blood pressure 140/65, pulse 94, temperature 98.1 F (36.7 C), temperature source Oral, resp. rate 20, height 4\' 11"  (1.499 m), weight 112 lb (50.803 kg), SpO2 97 %.  General: Well developed, well nourished, female in no acute distress Head: Eyes PERRLA, No xanthomas.   Normocephalic and atraumatic, oropharynx without edema or exudate. Dentition: poor Lungs: CTA Heart: HRRR S1 S2, no rub/gallop, S1, S2. 2/6 systolic murmur. Pulses are 2+ extrem.   Neck: No carotid bruits. No lymphadenopathy.  No JVD. Abdomen: Bowel sounds present, abdomen soft and non-tender without masses or hernias noted. Msk:  No spine or cva tenderness. No weakness, no joint deformities or effusions. Extremities: No clubbing or cyanosis.  edema.  Neuro: Alert and oriented X 3. No focal deficits noted. Psych:  Good affect, responds appropriately Skin: No rashes or lesions noted.  Labs:   Lab Results  Component Value Date   WBC 6.1 06/13/2015   HGB 5.9* 06/13/2015   HCT 20.1* 06/13/2015   MCV 95.7 06/13/2015   PLT 280 06/13/2015    Recent Labs Lab 06/13/15 0741  NA 139  K 5.4*  CL 99*  CO2 25  BUN 72*  CREATININE 6.70*  CALCIUM  9.1  GLUCOSE 166*    Recent Labs  06/13/15 0741  TROPONINI 0.21*    Echo: LVEF 50% - Left ventricle: Mild inferolateral  hypokinesis. The cavity size  was normal. Wall thickness was increased in a pattern of mild  LVH. The estimated ejection fraction was 50%. Doppler parameters  are consistent with high ventricular filling pressure. - Aortic valve: Sclerosis without stenosis. There was no  significant regurgitation. - Mitral valve: Calcified annulus. There was mild regurgitation. - Left atrium: The atrium was moderately to severely dilated. - Right ventricle: The cavity size was mildly dilated. Systolic  function was normal. - Right atrium: The atrium was moderately dilated. - Pulmonary arteries: PA peak pressure: 72 mm Hg (S).  ECG: NSR with diffuse ST depression   Left Heart Cath and Coronary Angiography 04/10/15    Conclusion    1. Dist RCA lesion, 95% stenosed. 2. Post Atrio lesion, 95% stenosed. 3. Prox RCA to Mid RCA lesion, 70% stenosed. 4. Acute Mrg lesion, 85% stenosed. 5. Ost Ramus to Ramus lesion, 99% stenosed. 6. Prox Cx to Mid Cx lesion, 80% stenosed. 7. Ost 1st Mrg to 1st Mrg lesion, 85% stenosed. 8. Ost 2nd Diag to 2nd Diag lesion, 75% stenosed. 9. Mid LAD to Dist LAD lesion, 80% stenosed.       Radiology:  Dg Chest 2 View  06/13/2015  CLINICAL DATA:  Dry cough.  History of hypertension and CAD. EXAM: CHEST  2 VIEW COMPARISON:  09/22/2014; 10/23/2013; 07/24/2013; 09/14/2010 FINDINGS: Obscuration of the right heart border secondary to worsening right mid lung heterogeneous airspace opacities. Otherwise, grossly unchanged enlarged cardiac silhouette and mediastinal contours with atherosclerotic plaque within the thoracic aorta. Partially calcified bilateral hilar lymph nodes, left greater than right. There is persistent moderate elevation of the right hemidiaphragm. No pleural effusion or pneumothorax. No evidence of edema. Unchanged bones including severe (greater 75%) compression deformity involving a lower thoracic vertebral body with associated focal kyphosis this location.  IMPRESSION: 1. Worsening right mid and lower lung heterogeneous airspace opacities - atelectasis versus infiltrate, progressed since the 08/2014 examination though new compared to the 07/24/2013 examination. As a right perihilar mass could result in a similar appearance, a follow-up chest radiograph in 3 to 4 weeks after treatment is recommended to ensure resolution. 2. Similar findings of cardiomegaly without definitive evidence of edema. 3. Unchanged moderate elevation of the right hemidiaphragm. Electronically Signed   By: Sandi Mariscal M.D.   On: 06/13/2015 08:11   Ct Head Wo Contrast  06/13/2015  CLINICAL DATA:  Right-sided facial numbness and pain. EXAM: CT HEAD WITHOUT CONTRAST CT MAXILLOFACIAL WITHOUT CONTRAST TECHNIQUE: Multidetector CT imaging of the head and maxillofacial structures were performed using the standard protocol without intravenous contrast. Multiplanar CT image reconstructions of the maxillofacial structures were also generated. COMPARISON:  None. FINDINGS: CT HEAD FINDINGS Bony calvarium appears intact. Mild diffuse cortical atrophy is noted. No mass effect or midline shift is noted. Ventricular size is within normal limits. There is no evidence of mass lesion, hemorrhage or acute infarction. CT MAXILLOFACIAL FINDINGS No evidence of fracture or other bony abnormality. Paranasal sinuses appear normal. Pterygoid plates appear normal. Globes and orbits appear normal. IMPRESSION: Mild diffuse cortical atrophy. No acute intracranial abnormality seen. No significant abnormality seen in the maxillofacial region. Electronically Signed   By: Marijo Conception, M.D.   On: 06/13/2015 09:30   Ct Maxillofacial Wo Cm  06/13/2015  CLINICAL DATA:  Right-sided facial numbness and pain. EXAM: CT HEAD WITHOUT  CONTRAST CT MAXILLOFACIAL WITHOUT CONTRAST TECHNIQUE: Multidetector CT imaging of the head and maxillofacial structures were performed using the standard protocol without intravenous contrast.  Multiplanar CT image reconstructions of the maxillofacial structures were also generated. COMPARISON:  None. FINDINGS: CT HEAD FINDINGS Bony calvarium appears intact. Mild diffuse cortical atrophy is noted. No mass effect or midline shift is noted. Ventricular size is within normal limits. There is no evidence of mass lesion, hemorrhage or acute infarction. CT MAXILLOFACIAL FINDINGS No evidence of fracture or other bony abnormality. Paranasal sinuses appear normal. Pterygoid plates appear normal. Globes and orbits appear normal. IMPRESSION: Mild diffuse cortical atrophy. No acute intracranial abnormality seen. No significant abnormality seen in the maxillofacial region. Electronically Signed   By: Marijo Conception, M.D.   On: 06/13/2015 09:30    ASSESSMENT AND PLAN:    Principal Problem:   Symptomatic anemia Active Problems:   Hyperlipidemia   Hypertension   Chronic Facial droop   Anemia in chronic kidney disease   End stage renal disease on dialysis ([place])   Anemia due to blood loss, chronic   GAVE (gastric antral vascular ectasia)   Demand ischemia ([facility])   CAD-severe multivessel, not CABG candidate   Moderate to severe pulmonary hypertension ([facility])   Abnormal chest x-ray  1. Chest pain with abnormal troponin - Recent cath results above. - Recently seen by CVTS, pt. Opted not to have surgery. - She is hypovolemic due to presumed GI bleed, cannot have heparin - Hypovolemia exacerbating ischemia.  - EKG shows sinus rhythm with ventricular rate 96 bpm.  Patient has ST segment depression in leads I and aVF as well as ST segment depression in leads V4, V5 and V6 with downsloping. These are new from previous EKG. - Cycle troponin - Would add low dose metoprolol 12.5mg  BID.   2. HTN - Slightly elevated, however pt. Is to have HD today.    Signed: Arbutus Leas, NP 06/13/2015 11:02 AM   Co-Sign MD  I have seen and examined the patient along with Arbutus Leas, NP.  I have reviewed the  chart, notes and new data.  I agree with PA's note.  Key new complaints: currently asymptomatic Key examination changes: no arrhythmia, no clinical signs of hypervolemia/CHF Key new findings / data: Hgb 5.9, marginal increase in troponin  PLAN: Her angina and diffuse ST depression is attributable to severe anemia and severe multivessel CAD. It is less likely that she is experiencing a true new athero-thrombotic event. The treatment is blood transfusion. Will also add a very low dose of beta blocker. I think was being avoided due to previous problems with low BP at dialysis. If this becomes a problem, can skip the medication on dialysis mornings. If it is necessary, can stop ASA, at least temporarily, to help stop active GI bleeding. In the long run it is preferable that she remain on ASA 81 mg daily, but clearly the bleeding problem is as big of an issue as her CAD.  Sanda Klein, MD, [place] [phone] 06/13/2015, 12:15 PM

## 2015-06-13 NOTE — Progress Notes (Signed)
Hemodialysis- Pt very agitated about stay (d/t no stepdown beds available). Wants to sign out of hospital AMA. "Ill go die at home if this is how it is." Explained to patient the need to stay to have her procedure tomorrow and the risk of bleeding. Dr. Eliseo Squires notified. Will change admit orders to tele bed in hopes of getting a bed sooner. Pt denies pain. Vitals currently stable. Notified renal PA as well. Continue to monitor patient.

## 2015-06-13 NOTE — Progress Notes (Signed)
Hemodialysis- Pt signed off with 28 minutes remaining on treatment d/t cramping. Currently awaiting bed (stepdown), continue to monitor in hd unit.

## 2015-06-13 NOTE — H&P (Signed)
[facility] History and Physical                                                                                    [name], is a 82 y.o. female  MRN: [id number]   DOB - 1931-05-20  Admit Date - 06/13/2015  Outpatient Primary MD for the patient is [name], MD  Referring MD: [place] / ER  Consulting M.D: [facility];  [facility]; [facility]  PMH: Past Medical History  Diagnosis Date  . Hyperlipidemia   . Hypertension   . Anxiety   . Facial droop 1935    acquired during forceps delivery, some residual R visual loss  . Hx of breast cancer 2004  . Arthritis     mild in lower back (Drs Posey Pronto and Joelyn Oms)  . GERD (gastroesophageal reflux disease)   . Bradycardia 2014    a. Noted 06/2012.  Marland Kitchen Hypotension     a. Associated w/ dialysis.  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 [name])  . Osteoporosis 11/2013    DEXA -3.4 radius  . GAVE (gastric antral vascular ectasia) 08/2014    s/p EGD with APC  . Anemia in chronic kidney disease     IV iron infusion and anemia from GAVE blood loss.  . End stage renal disease on dialysis [facility])     HD M,W,F [place] AVF now LUE AVF  . Breast cancer ([place])     Left Breast 12 yrs. ago  . Hearing loss     Bilateral hearing aids  . Sight impaired     Left: wears contact. Right: diminished peripheral vision  . Cardiac murmur     a. thought due to AVF (2D echo 06/2012 without significant valvular disease).   Marland Kitchen CAD (coronary artery disease), native coronary artery     3 vessel CAD by cath 04/2015      PSH: Past Surgical History  Procedure Laterality Date  . Cataract extraction  1981  . Av fistula placement  07/20/10    Right brachiocephalic AVF  . Umbilical hernia repair  2011  . Dexa  2013    [facility]  . US echocardiography  06/2012    LVH, EF 65%, grade 1 diastol dysfunction, mod dliated LAD  . Eye surgery  1966    regular cataract  . Breast lumpectomy Left     . Breast biopsy Left   . Bascilic vein transposition Left 07/24/2013    Procedure: BASILIC VEIN TRANSPOSITION;  Surgeon: [name], MD;  Location: [place];  Service: Vascular;  Laterality: Left;  . Shuntogram N/A 04/16/2011    Procedure: Earney Mallet;  Surgeon: [name], MD;  Location: [facility] CATH LAB;  Service: Cardiovascular;  Laterality: N/A;  . Shuntogram Right 07/05/2013    Procedure: FISTULOGRAM;  Surgeon: [name], MD;  Location: [facility] CATH LAB;  Service: Cardiovascular;  Laterality: Right;  . Esophagogastroduodenoscopy N/A 04/09/2014    Procedure: ESOPHAGOGASTRODUODENOSCOPY (EGD);  Surgeon: [name], MD;  Location: [place];  Service: Endoscopy;  Laterality: N/A;  . Peripheral vascular catheterization N/A 08/15/2014    Procedure:  A/V Shuntogram/Fistulagram;  Surgeon: [name], MD;  Location: [place] CV LAB;  Service: Cardiovascular;  Laterality: N/A;  . Esophagogastroduodenoscopy N/A 09/23/2014    Procedure: ESOPHAGOGASTRODUODENOSCOPY (EGD);  Surgeon: [name], MD;  Location: [place];  Service: Endoscopy;  Laterality: N/A;  . Hernia repair    . Esophagogastroduodenoscopy (egd) with propofol N/A 11/12/2014    Procedure: ESOPHAGOGASTRODUODENOSCOPY (EGD) WITH PROPOFOL;  Surgeon: [name], MD;  Location: [facility] ENDOSCOPY;  Service: Endoscopy;  Laterality: N/A;  . Hot hemostasis N/A 11/12/2014    Procedure: HOT HEMOSTASIS (ARGON PLASMA COAGULATION/BICAP);  Surgeon: [name], MD;  Location: [facility];  Service: Endoscopy;  Laterality: N/A;  . Esophagogastroduodenoscopy N/A 12/26/2014    neg H pylori, benign biopsies; ESOPHAGOGASTRODUODENOSCOPY (EGD);  Surgeon: [name], MD  . Cardiac catheterization N/A 04/10/2015    Procedure: Left Heart Cath and Coronary Angiography;  Surgeon: [name], MD;  Location: [place] CV LAB;  Service: Cardiovascular;  Laterality: N/A;  . Peripheral vascular catheterization N/A 04/28/2015    Procedure:  A/V Shuntogram/Fistulagram;  Surgeon: [name], MD;  Location: [place] CV LAB;  Service: Cardiovascular;  Laterality: N/A;  . Peripheral vascular catheterization Left 04/28/2015    Procedure: A/V Shunt Intervention;  Surgeon: [name], MD;  Location: [place] CV LAB;  Service: Cardiovascular;  Laterality: Left;     CC:  Chief Complaint  Patient presents with  . Facial Pain  . Nausea     HPI: 82 year old female patient with history of chronic right facial droop since childhood, chronic kidney disease on dialysis, hypertension, moderate severe pulmonary hypertension, known GAVE with planned outpatient EGD for APC, chronic anemia secondary to chronic blood loss as well as anemia of chronic kidney disease, known multivessel severe CAD not a candidate for CABG due to multiple medical comorbidities. Patient will last seen by primary gastroenterologist in the office on 3/9 to schedule outpatient EGD. She returned to the ER today after awakening with right facial pain with associated nausea and nonbilious vomiting. She has not noticed any increase in volume of her chronic melanotic bowel movements, she has not had any abdominal pain, she's not had any bright red blood per rectum. She reports 2-3 bowel movements per day. She was resumed on aspirin by her cardiologist 2 months ago. Her baseline hemoglobin is around 11 although this past Monday at dialysis it was down to around 8. In discussing her typical cardiac symptomatology she reports in June 2016 she came to the hospital because of shortness of breath related to anemia. At that time it appeared she had heart problems and a complete cardiac evaluation was completed. This eventually led to cardiac catheterization in January 2017 which revealed multivessel CAD. Because of inability to treat with either percutaneous intervention or surgical option medical therapy was opted for. Of note patient reports she has never had chest pain.  ER  Evaluation and treatment: Afebrile, BP 142/75, pulse 96, respirations 25-28 breaths per minute--repeat vital signs BP 140/65, pulse 95, respirations 20; room air saturations have remained stable between 95 and 97% EKG: Shows sinus rhythm with ventricular rate 96 bpm, QTC 495 ms, patient has ST segment depression in leads I and to a lesser degree and aVF as well as significant (at least 1.5 mm) ST segment depression in leads V4, V5 and V6 with downsloping. These are new from previous EKG. 2 View CXR: Worsening right mid and lower lung airspace opacities atelectasis versus infiltrate and progressed  since June 2016 but new compared to exam for 2815-radiologist suggests that a right perihilar mass could result in a similar appearance and recommended repeat chest x-ray in 3-4 weeks. CT head and maxillofacial without contrast: No acute changes Laboratory data: Na 139, K 5.4, BUN 72, Cr 6.7, glucose 166, troponin 0.21, lactic acid 3.26, WBC 6100 and normal differential, hemoglobin 5.9, platelets 280,000, FOB positive Protonix 40 mg IV 1 dose  Review of Systems   In addition to the HPI above,  No Fever-chills, myalgias or other constitutional symptoms No Headache, changes with Vision or hearing, new weakness, tingling, numbness in any extremity, No problems swallowing food or Liquids, indigestion/reflux No Chest pain, Cough or Shortness of Breath, palpitations, orthopnea or DOE No Abdominal pain, N/V; no melena or hematochezia, no dark tarry stools, Bowel movements are regular, No dysuria, hematuria or flank pain No new skin rashes, lesions, masses or bruises, No new joints pains-aches No recent weight gain or loss No polyuria, polydypsia or polyphagia,  *A full 10 point Review of Systems was done, except as stated above, all other Review of Systems were negative.  Social History Social History  Substance Use Topics  . Smoking status: Former Smoker -- 2 years    Types: Cigarettes    Quit date:  03/29/1953  . Smokeless tobacco: Never Used  . Alcohol Use: 0.0 oz/week    0 Standard drinks or equivalent per week     Comment: occasional    Resides at: Private residence  Lives with: Alone  Ambulatory status: Without assistive devices, patient still drives, does her own shopping, cleaning and lawn work   Family History Family History  Problem Relation Age of Onset  . Heart disease Brother     Congenital  . Hypertension Brother   . Diabetes Brother   . Cancer Father     stomach  . CAD Father     MI at age 50  . Heart disease Father   . Heart attack Father   . Cancer Sister     female (uterus?)  . Hypertension Mother      Prior to Admission medications   Medication Sig Start Date End Date Taking? Authorizing Provider  acetaminophen (TYLENOL) 500 MG tablet Take 1,000 mg by mouth every 6 (six) hours as needed for moderate pain.   Yes Historical Provider, MD  amLODipine (NORVASC) 2.5 MG tablet Take 2.5 mg by mouth at bedtime.   Yes Historical Provider, MD  Artificial Tear Ointment (REFRESH P.M. OP) Place 1 application into the right eye daily.   Yes Historical Provider, MD  aspirin EC 81 MG tablet Take 1 tablet (81 mg total) by mouth daily. 03/06/15  Yes Lelon Perla, MD  atorvastatin (LIPITOR) 80 MG tablet Take 1 tablet (80 mg total) by mouth daily. 05/06/15  Yes Lelon Perla, MD  B Complex-C-Folic Acid (RENA-VITE PO) Take 1 tablet by mouth daily with breakfast.    Yes Historical Provider, MD  Calcium Carbonate Antacid (TUMS ULTRA 1000 PO) Take 3,000 mg by mouth 3 (three) times daily with meals.    Yes Historical Provider, MD  cholecalciferol (VITAMIN D) 1000 UNITS tablet Take 3,000 Units by mouth every Monday, Wednesday, and Friday.    Yes Historical Provider, MD  fexofenadine (ALLEGRA) 180 MG tablet Take 180 mg by mouth 2 (two) times daily.   Yes Historical Provider, MD  pantoprazole (PROTONIX) 40 MG tablet Take 1 tablet (40 mg total) by mouth 2 (two) times  daily. Patient taking  differently: Take 80 mg by mouth daily.  05/08/15  Yes Mauri Pole, MD  [place] Anemia Therapy   Yes Historical Provider, MD    Allergies  Allergen Reactions  . Sulfa Antibiotics Rash    Physical Exam  Vitals  Blood pressure 140/65, pulse 94, temperature 98.1 F (36.7 C), temperature source Oral, resp. rate 20, height 4\' 11"  (1.499 m), weight 112 lb (50.803 kg), SpO2 97 %.   General:  In no acute distress, appears Chronically ill but otherwise stated age  Psych:  Normal affect, Denies Suicidal or Homicidal ideations, Awake Alert, Oriented X 3. Speech and thought patterns are clear and appropriate, no apparent short term memory deficits  Neuro:   No focal neurological deficits, CN II through XII intact, has chronic right facial droop with chronic right eye ptosis and associated dry eye,Strength 5/5 all 4 extremities, Sensation intact all 4 extremities.  ENT:  Ears and Eyes appear Normal, Conjunctivae clear-has bilateral dry worse on right, PER. Moist oral mucosa without erythema or exudates. Right facial droop as noted above  Neck:  Supple, No lymphadenopathy appreciated  Respiratory:  Symmetrical chest wall movement, Good air movement bilaterally, CTAB. Room Air  Cardiac:  RRR, No Murmurs, no LE edema noted, no JVD, No carotid bruits, peripheral pulses palpable at 2+  Abdomen:  Positive bowel sounds, Soft, Non tender, Non distended,  No masses appreciated, no obvious hepatosplenomegaly  Skin:  No Cyanosis, Normal Skin Turgor, No Skin Rash or Bruise.  Extremities: Symmetrical without obvious trauma or injury,  no effusions.  Data Review  CBC  Recent Labs Lab 06/13/15 0741  WBC 6.1  HGB 5.9*  HCT 20.1*  PLT 280  MCV 95.7  MCH 28.1  MCHC 29.4*  RDW 17.2*  LYMPHSABS 1.4  MONOABS 0.6  EOSABS 0.1  BASOSABS 0.0    Chemistries   Recent Labs Lab 06/13/15 0741  NA 139  K 5.4*  CL 99*  CO2 25  GLUCOSE 166*  BUN  72*  CREATININE 6.70*  CALCIUM 9.1    estimated creatinine clearance is 4.5 mL/min (by C-G formula based on Cr of 6.7).  No results for input(s): TSH, T4TOTAL, T3FREE, THYROIDAB in the last 72 hours.  Invalid input(s): FREET3  Coagulation profile No results for input(s): INR, PROTIME in the last 168 hours.  No results for input(s): DDIMER in the last 72 hours.  Cardiac Enzymes  Recent Labs Lab 06/13/15 0741  TROPONINI 0.21*    Invalid input(s): POCBNP  Urinalysis No results found for: COLORURINE, APPEARANCEUR, LABSPEC, PHURINE, GLUCOSEU, HGBUR, BILIRUBINUR, KETONESUR, PROTEINUR, UROBILINOGEN, NITRITE, LEUKOCYTESUR  Imaging results:   Dg Chest 2 View  06/13/2015  CLINICAL DATA:  Dry cough.  History of hypertension and CAD. EXAM: CHEST  2 VIEW COMPARISON:  09/22/2014; 10/23/2013; 07/24/2013; 09/14/2010 FINDINGS: Obscuration of the right heart border secondary to worsening right mid lung heterogeneous airspace opacities. Otherwise, grossly unchanged enlarged cardiac silhouette and mediastinal contours with atherosclerotic plaque within the thoracic aorta. Partially calcified bilateral hilar lymph nodes, left greater than right. There is persistent moderate elevation of the right hemidiaphragm. No pleural effusion or pneumothorax. No evidence of edema. Unchanged bones including severe (greater 75%) compression deformity involving a lower thoracic vertebral body with associated focal kyphosis this location. IMPRESSION: 1. Worsening right mid and lower lung heterogeneous airspace opacities - atelectasis versus infiltrate, progressed since the 08/2014 examination though new compared to the 07/24/2013 examination. As a right perihilar mass could result in a similar appearance, a  follow-up chest radiograph in 3 to 4 weeks after treatment is recommended to ensure resolution. 2. Similar findings of cardiomegaly without definitive evidence of edema. 3. Unchanged moderate elevation of the right  hemidiaphragm. Electronically Signed   By: Sandi Mariscal M.D.   On: 06/13/2015 08:11   Ct Head Wo Contrast  06/13/2015  CLINICAL DATA:  Right-sided facial numbness and pain. EXAM: CT HEAD WITHOUT CONTRAST CT MAXILLOFACIAL WITHOUT CONTRAST TECHNIQUE: Multidetector CT imaging of the head and maxillofacial structures were performed using the standard protocol without intravenous contrast. Multiplanar CT image reconstructions of the maxillofacial structures were also generated. COMPARISON:  None. FINDINGS: CT HEAD FINDINGS Bony calvarium appears intact. Mild diffuse cortical atrophy is noted. No mass effect or midline shift is noted. Ventricular size is within normal limits. There is no evidence of mass lesion, hemorrhage or acute infarction. CT MAXILLOFACIAL FINDINGS No evidence of fracture or other bony abnormality. Paranasal sinuses appear normal. Pterygoid plates appear normal. Globes and orbits appear normal. IMPRESSION: Mild diffuse cortical atrophy. No acute intracranial abnormality seen. No significant abnormality seen in the maxillofacial region. Electronically Signed   By: Marijo Conception, M.D.   On: 06/13/2015 09:30   Ct Maxillofacial Wo Cm  06/13/2015  CLINICAL DATA:  Right-sided facial numbness and pain. EXAM: CT HEAD WITHOUT CONTRAST CT MAXILLOFACIAL WITHOUT CONTRAST TECHNIQUE: Multidetector CT imaging of the head and maxillofacial structures were performed using the standard protocol without intravenous contrast. Multiplanar CT image reconstructions of the maxillofacial structures were also generated. COMPARISON:  None. FINDINGS: CT HEAD FINDINGS Bony calvarium appears intact. Mild diffuse cortical atrophy is noted. No mass effect or midline shift is noted. Ventricular size is within normal limits. There is no evidence of mass lesion, hemorrhage or acute infarction. CT MAXILLOFACIAL FINDINGS No evidence of fracture or other bony abnormality. Paranasal sinuses appear normal. Pterygoid plates appear  normal. Globes and orbits appear normal. IMPRESSION: Mild diffuse cortical atrophy. No acute intracranial abnormality seen. No significant abnormality seen in the maxillofacial region. Electronically Signed   By: Marijo Conception, M.D.   On: 06/13/2015 09:30     EKG: (Independently reviewed)  Shows sinus rhythm with ventricular rate 96 bpm, QTC 495 ms, patient has ST segment depression in leads one to into a lesser degree and aVF as well as significant at least 1.5 mm ST segment depression in leads V4, V5 and V6 with downsloping. These are new from previous EKG.   Assessment & Plan  Principal Problem:   Symptomatic anemia/anemia of chronic blood loss and CKD -Patient reports has had chronic melena for several months with presumed source from GAVE (see below) -Admit to stepdown/Inpatient -Transfuse 2 units packed red blood cells now and repeat CBC-anticipate will at least need a minimum of 2 more units of packed red blood cells -Patient reports baseline hemoglobin around 11 although at dialysis on Monday hemoglobin was around 8 -Follow CBC serially-at this juncture since anticipate repeated transfusions will obtain after blood infused with eventual goal to check CBC every 12 hours  Active Problems:   GAVE -Appreciate GI input and likely plans for EGD with APC today -Suspected source of ongoing chronic GI bleeding -Uncertain role of low-dose aspirin antiplatelet therapy -NPO    Demand ischemia/CAD-severe multivessel, not CABG candidate -Patient clearly has ischemic changes on admission EKG -dose NOT have classic angina although ? If facial/jaw pain her anginal equivalent (pt reported that when diagnosed was only having SOB in setting of anemia) -Cardiology consultation and input pending -Preadmission  focus had been on medical management only -EDP reports ischemic-appearing EKG shown to on-call STEMI cardiologist Dr. Irish Lack who reported to continue focus on medical management since with  obvious bleeding patient not a candidate for cath/anticoagulation -We'll repeat EKG after transfusion still abnormal and if hemodynamically stable can consider IV nitroglycerin infusion-also awaiting cardiology recommendations -Initial troponin mildly elevated-continue to cycle -Patient has associated moderate metabolic acidemia with lactic acid greater than 3 likely related to poor perfusion from anemia    Hypertension -Current blood pressure controlled but given ongoing bleeding we'll hold antihypertensive medications for now    End stage renal disease on dialysis  -Dialyzes on MWF -Nephrology notified of admission and plans are to dialyze today    Moderate to severe pulmonary hypertension  -Patient reports chronic dyspnea on exertion made worse by anemia -Continue medical management    Abnormal chest x-ray -Worsening right mid and lower lung airspace opacities atelectasis versus infiltrate and progressed since June 2016 but new compared to exam for 2815-radiologist suggests that a right perihilar mass could result in a similar appearance and recommended repeat chest x-ray in 3-4 weeks. -Agent without any upper respiratory infectious type symptoms so may benefit from CT of the chest this admission to better clarify    Hyperlipidemia -Preadmission medications on hold    Chronic Facial droop    DVT Prophylaxis: SCDs  Family Communication:  No family at bedside   Code Status:  Full code  Condition:  Guarded  Discharge disposition: Anticipate discharge back to preadmission home environment  Time spent in minutes : 60      Alban Marucci L. ANP on 06/13/2015 at 10:45 AM  You may contact me by going to [email or web address] - password [email or web address]  I am available from 7a-7p but please confirm I am on the schedule by going to Amion as above.   After 7p please contact night coverage person covering me after hours  [facility]

## 2015-06-13 NOTE — Procedures (Signed)
Tolerating HD despite profound anemia.  Getting 2 units of PRBCs with treatment.  BP stable. [name] C

## 2015-06-13 NOTE — ED Notes (Signed)
CRITICAL VALUE ALERT  Critical value received: HgB 5.9  Date of notification:  06/13/2015  Time of notification:  0823  Critical value read back: Yes  Nurse who received alert:  Earleen Newport RN   MD notified (1st page): [facility] MD

## 2015-06-13 NOTE — ED Notes (Signed)
Pt states she woke up to right facial pain and nausea; facial pain is dull and achy 5/10 on arrival; Pt gets dialysis M,W,FRI., Pt has fistula in left upper arm; Pt a&ox 4 on arrival; Pt has a right side facial droop that is baseline on arrival. Pt states she has Hemoglobin that is normally low and causes her to have SOB at baseline.

## 2015-06-14 ENCOUNTER — Inpatient Hospital Stay (HOSPITAL_COMMUNITY): Payer: Medicare Other

## 2015-06-14 DIAGNOSIS — I214 Non-ST elevation (NSTEMI) myocardial infarction: Secondary | ICD-10-CM

## 2015-06-14 DIAGNOSIS — R079 Chest pain, unspecified: Secondary | ICD-10-CM

## 2015-06-14 DIAGNOSIS — R938 Abnormal findings on diagnostic imaging of other specified body structures: Secondary | ICD-10-CM

## 2015-06-14 LAB — TYPE AND SCREEN
ABO/RH(D): O POS
Antibody Screen: NEGATIVE
UNIT DIVISION: 0
UNIT DIVISION: 0

## 2015-06-14 LAB — CBC
HCT: 30.5 % — ABNORMAL LOW (ref 36.0–46.0)
HEMOGLOBIN: 9.8 g/dL — AB (ref 12.0–15.0)
MCH: 28.7 pg (ref 26.0–34.0)
MCHC: 32.1 g/dL (ref 30.0–36.0)
MCV: 89.4 fL (ref 78.0–100.0)
Platelets: 191 10*3/uL (ref 150–400)
RBC: 3.41 MIL/uL — AB (ref 3.87–5.11)
RDW: 16.9 % — ABNORMAL HIGH (ref 11.5–15.5)
WBC: 6.4 10*3/uL (ref 4.0–10.5)

## 2015-06-14 LAB — BASIC METABOLIC PANEL
ANION GAP: 14 (ref 5–15)
BUN: 26 mg/dL — ABNORMAL HIGH (ref 6–20)
CHLORIDE: 97 mmol/L — AB (ref 101–111)
CO2: 27 mmol/L (ref 22–32)
Calcium: 8.2 mg/dL — ABNORMAL LOW (ref 8.9–10.3)
Creatinine, Ser: 3.97 mg/dL — ABNORMAL HIGH (ref 0.44–1.00)
GFR calc non Af Amer: 10 mL/min — ABNORMAL LOW (ref >60)
GFR, EST AFRICAN AMERICAN: 11 mL/min — AB (ref >60)
Glucose, Bld: 90 mg/dL (ref 65–99)
Potassium: 3.9 mmol/L (ref 3.5–5.1)
Sodium: 138 mmol/L (ref 135–145)

## 2015-06-14 LAB — ECHOCARDIOGRAM LIMITED
Height: 59 in
Weight: 1878.67 oz

## 2015-06-14 LAB — TROPONIN I: Troponin I: 21.74 ng/mL (ref <0.031)

## 2015-06-14 MED ORDER — ALPRAZOLAM 0.25 MG PO TABS
0.2500 mg | ORAL_TABLET | Freq: Once | ORAL | Status: AC
  Administered 2015-06-14: 0.25 mg via ORAL
  Filled 2015-06-14: qty 1

## 2015-06-14 MED ORDER — IOHEXOL 300 MG/ML  SOLN
75.0000 mL | Freq: Once | INTRAMUSCULAR | Status: AC | PRN
  Administered 2015-06-14: 75 mL via INTRAVENOUS

## 2015-06-14 MED ORDER — PANTOPRAZOLE SODIUM 40 MG IV SOLR
40.0000 mg | Freq: Two times a day (BID) | INTRAVENOUS | Status: DC
  Administered 2015-06-14 – 2015-06-15 (×3): 40 mg via INTRAVENOUS
  Filled 2015-06-14 (×3): qty 40

## 2015-06-14 MED ORDER — DARBEPOETIN ALFA 200 MCG/0.4ML IJ SOSY: 200.0000 ug | PREFILLED_SYRINGE | INTRAMUSCULAR | Status: DC

## 2015-06-14 MED ORDER — LORAZEPAM 2 MG/ML IJ SOLN: 0.5000 mg | Freq: Once | INTRAMUSCULAR | Status: DC

## 2015-06-14 NOTE — Progress Notes (Signed)
DAILY PROGRESS NOTE  Subjective:  Troponin rose to 21.74 overnight. She is FOBT positive. H/H up to 10/30 from 6/20 yesterday after transfusion. Creatinine is improving after transfusion.  Objective:  Temp:  [97.5 F (36.4 C)-98.7 F (37.1 C)] 98.7 F (37.1 C) (03/18 0457) Pulse Rate:  [71-95] 71 (03/18 0457) Resp:  [18-26] 20 (03/18 0457) BP: (90-208)/(40-119) 134/58 mmHg (03/18 0457) SpO2:  [94 %-98 %] 98 % (03/18 0457) Weight:  [117 lb 6.7 oz (53.26 kg)-118 lb 8 oz (53.751 kg)] 117 lb 6.7 oz (53.26 kg) (03/17 2052) Weight change: 5 lb 8.1 oz (2.497 kg)  Intake/Output from previous day: 03/17 0701 - 03/18 0700 In: 670 [Blood:670] Out: 470   Intake/Output from this shift:    Medications: Current Facility-Administered Medications  Medication Dose Route Frequency Provider Last Rate Last Dose  . 0.9 %  sodium chloride infusion  100 mL Intravenous PRN Alric Seton, PA-C      . 0.9 %  sodium chloride infusion  100 mL Intravenous PRN Alric Seton, PA-C      . 0.9 %  sodium chloride infusion   Intravenous Continuous Geradine Girt, DO      . acetaminophen (TYLENOL) tablet 650 mg  650 mg Oral Q6H PRN Samella Parr, NP       Or  . acetaminophen (TYLENOL) suppository 650 mg  650 mg Rectal Q6H PRN Samella Parr, NP      . alteplase (CATHFLO ACTIVASE) injection 2 mg  2 mg Intracatheter Once PRN Alric Seton, PA-C      . calcitRIOL (ROCALTROL) capsule 1.25 mcg  1.25 mcg Oral Q M,W,F-HD Alric Seton, PA-C   1.25 mcg at 06/13/15 2016  . [START ON 06/18/2015] Darbepoetin Alfa (ARANESP) injection 200 mcg  200 mcg Intravenous Q Wed-HD Alric Seton, PA-C   200 mcg at 06/13/15 1407  . ferric gluconate (NULECIT) 250 mg in sodium chloride 0.9 % 100 mL IVPB  250 mg Intravenous Q M,W,F-HD Alric Seton, PA-C   250 mg at 06/13/15 1500  . heparin injection 1,000 Units  1,000 Units Dialysis PRN Alric Seton, PA-C      . lidocaine (PF) (XYLOCAINE) 1 % injection 5 mL  5 mL  Intradermal PRN Alric Seton, PA-C      . lidocaine-prilocaine (EMLA) cream 1 application  1 application Topical PRN Alric Seton, PA-C      . metoprolol tartrate (LOPRESSOR) tablet 12.5 mg  12.5 mg Oral BID Arbutus Leas, NP   12.5 mg at 06/13/15 2015  . morphine 2 MG/ML injection 1 mg  1 mg Intravenous Q2H PRN Samella Parr, NP      . pentafluoroprop-tetrafluoroeth (GEBAUERS) aerosol 1 application  1 application Topical PRN Alric Seton, PA-C      . sodium chloride flush (NS) 0.9 % injection 3 mL  3 mL Intravenous Q12H Samella Parr, NP        Physical Exam: General appearance: alert and no distress Lungs: diminished breath sounds bibasilar and rales bibasilar Heart: regular rate and rhythm, S1, S2 normal, no murmur, click, rub or gallop Extremities: extremities normal, atraumatic, no cyanosis or edema Neurologic: Grossly normal  Lab Results: Results for orders placed or performed during the hospital encounter of 06/13/15 (from the past 48 hour(s))  CBG monitoring, ED     Status: Abnormal   Collection Time: 06/13/15  5:09 AM  Result Value Ref Range   Glucose-Capillary 194 (H) 65 - 99 mg/dL   Comment 1  Notify RN   CBC with Differential/Platelet     Status: Abnormal   Collection Time: 06/13/15  7:41 AM  Result Value Ref Range   WBC 6.1 4.0 - 10.5 K/uL   RBC 2.10 (L) 3.87 - 5.11 MIL/uL   Hemoglobin 5.9 (LL) 12.0 - 15.0 g/dL    Comment: REPEATED TO VERIFY CRITICAL RESULT CALLED TO, READ BACK BY AND VERIFIED WITH: HOLLY HALL,RN AT 8101 06/13/15 BY ZBEECH.    HCT 20.1 (L) 36.0 - 46.0 %   MCV 95.7 78.0 - 100.0 fL   MCH 28.1 26.0 - 34.0 pg   MCHC 29.4 (L) 30.0 - 36.0 g/dL   RDW 17.2 (H) 11.5 - 15.5 %   Platelets 280 150 - 400 K/uL   Neutrophils Relative % 66 %   Neutro Abs 4.1 1.7 - 7.7 K/uL   Lymphocytes Relative 23 %   Lymphs Abs 1.4 0.7 - 4.0 K/uL   Monocytes Relative 9 %   Monocytes Absolute 0.6 0.1 - 1.0 K/uL   Eosinophils Relative 1 %   Eosinophils Absolute 0.1  0.0 - 0.7 K/uL   Basophils Relative 0 %   Basophils Absolute 0.0 0.0 - 0.1 K/uL  Basic metabolic panel     Status: Abnormal   Collection Time: 06/13/15  7:41 AM  Result Value Ref Range   Sodium 139 135 - 145 mmol/L   Potassium 5.4 (H) 3.5 - 5.1 mmol/L   Chloride 99 (L) 101 - 111 mmol/L   CO2 25 22 - 32 mmol/L   Glucose, Bld 166 (H) 65 - 99 mg/dL   BUN 72 (H) 6 - 20 mg/dL   Creatinine, Ser 6.70 (H) 0.44 - 1.00 mg/dL   Calcium 9.1 8.9 - 10.3 mg/dL   GFR calc non Af Amer 5 (L) >60 mL/min   GFR calc Af Amer 6 (L) >60 mL/min    Comment: (NOTE) The eGFR has been calculated using the CKD EPI equation. This calculation has not been validated in all clinical situations. eGFR's persistently <60 mL/min signify possible Chronic Kidney Disease.    Anion gap 15 5 - 15  Troponin I     Status: Abnormal   Collection Time: 06/13/15  7:41 AM  Result Value Ref Range   Troponin I 0.21 (H) <0.031 ng/mL    Comment:        PERSISTENTLY INCREASED TROPONIN VALUES IN THE RANGE OF 0.04-0.49 ng/mL CAN BE SEEN IN:       -UNSTABLE ANGINA       -CONGESTIVE HEART FAILURE       -MYOCARDITIS       -CHEST TRAUMA       -ARRYHTHMIAS       -LATE PRESENTING MYOCARDIAL INFARCTION       -COPD   CLINICAL FOLLOW-UP RECOMMENDED.   POC occult blood, ED RN will collect     Status: Abnormal   Collection Time: 06/13/15  8:14 AM  Result Value Ref Range   Fecal Occult Bld POSITIVE (A) NEGATIVE  I-Stat CG4 Lactic Acid, ED     Status: Abnormal   Collection Time: 06/13/15  8:35 AM  Result Value Ref Range   Lactic Acid, Venous 3.26 (HH) 0.5 - 2.0 mmol/L   Comment NOTIFIED PHYSICIAN   Type and screen [place]     Status: None   Collection Time: 06/13/15  9:01 AM  Result Value Ref Range   ABO/RH(D) O POS    Antibody Screen NEG    Sample Expiration  06/16/2015    Unit Number [id number]    Blood Component Type RED CELLS,LR    Unit division 00    Status of Unit ISSUED,FINAL    Transfusion Status  OK TO TRANSFUSE    Crossmatch Result Compatible    Unit Number [id number]    Blood Component Type RED CELLS,LR    Unit division 00    Status of Unit ISSUED,FINAL    Transfusion Status OK TO TRANSFUSE    Crossmatch Result Compatible   Prepare RBC     Status: None   Collection Time: 06/13/15  9:01 AM  Result Value Ref Range   Order Confirmation ORDER PROCESSED BY BLOOD BANK   I-Stat arterial blood gas, ED     Status: Abnormal   Collection Time: 06/13/15  9:01 AM  Result Value Ref Range   pH, Arterial 7.463 (H) 7.350 - 7.450   pCO2 arterial 37.1 35.0 - 45.0 mmHg   pO2, Arterial 65.0 (L) 80.0 - 100.0 mmHg   Bicarbonate 26.5 (H) 20.0 - 24.0 mEq/L   TCO2 28 0 - 100 mmol/L   O2 Saturation 94.0 %   Acid-Base Excess 3.0 (H) 0.0 - 2.0 mmol/L   Patient temperature HIDE    Collection site BRACHIAL ARTERY    Drawn by Operator    Sample type ARTERIAL   Hepatitis B surface antigen     Status: None   Collection Time: 06/13/15 11:53 AM  Result Value Ref Range   Hepatitis B Surface Ag Negative Negative    Comment: (NOTE) Stat results called to Clarene Reamer 06/13/15 @21 :30 Performed At: [facility] [place], [place] [id number] Lindon Romp MD [id number]   CBC     Status: Abnormal   Collection Time: 06/13/15  4:49 PM  Result Value Ref Range   WBC 7.9 4.0 - 10.5 K/uL   RBC 3.04 (L) 3.87 - 5.11 MIL/uL   Hemoglobin 8.6 (L) 12.0 - 15.0 g/dL    Comment: REPEATED TO VERIFY POST TRANSFUSION SPECIMEN DELTA CHECK NOTED    HCT 26.8 (L) 36.0 - 46.0 %   MCV 88.2 78.0 - 100.0 fL    Comment: REPEATED TO VERIFY POST TRANSFUSION SPECIMEN DELTA CHECK NOTED    MCH 28.3 26.0 - 34.0 pg   MCHC 32.1 30.0 - 36.0 g/dL   RDW 16.5 (H) 11.5 - 15.5 %   Platelets 251 150 - 400 K/uL  Troponin I (q 6hr x 3)     Status: Abnormal   Collection Time: 06/13/15  6:30 PM  Result Value Ref Range   Troponin I 16.31 (HH) <0.031 ng/mL    Comment:        POSSIBLE  MYOCARDIAL ISCHEMIA. SERIAL TESTING RECOMMENDED. CRITICAL RESULT CALLED TO, READ BACK BY AND VERIFIED WITHEd Blalock RN [phone] 1933 GREEN R   Renal function panel     Status: Abnormal   Collection Time: 06/13/15  6:30 PM  Result Value Ref Range   Sodium 137 135 - 145 mmol/L   Potassium 3.3 (L) 3.5 - 5.1 mmol/L    Comment: DELTA CHECK NOTED   Chloride 96 (L) 101 - 111 mmol/L   CO2 28 22 - 32 mmol/L   Glucose, Bld 123 (H) 65 - 99 mg/dL   BUN 19 6 - 20 mg/dL   Creatinine, Ser 3.11 (H) 0.44 - 1.00 mg/dL    Comment: DIALYSIS   Calcium 8.1 (L) 8.9 - 10.3 mg/dL   Phosphorus 2.8 2.5 - 4.6 mg/dL  Albumin 3.1 (L) 3.5 - 5.0 g/dL   GFR calc non Af Amer 13 (L) >60 mL/min   GFR calc Af Amer 15 (L) >60 mL/min    Comment: (NOTE) The eGFR has been calculated using the CKD EPI equation. This calculation has not been validated in all clinical situations. eGFR's persistently <60 mL/min signify possible Chronic Kidney Disease.    Anion gap 13 5 - 15  MRSA PCR Screening     Status: None   Collection Time: 06/13/15  6:50 PM  Result Value Ref Range   MRSA by PCR NEGATIVE NEGATIVE    Comment:        The GeneXpert MRSA Assay (FDA approved for NASAL specimens only), is one component of a comprehensive MRSA colonization surveillance program. It is not intended to diagnose MRSA infection nor to guide or monitor treatment for MRSA infections.   Troponin I (q 6hr x 3)     Status: Abnormal   Collection Time: 06/14/15  1:31 AM  Result Value Ref Range   Troponin I 21.74 (HH) <0.031 ng/mL    Comment:        POSSIBLE MYOCARDIAL ISCHEMIA. SERIAL TESTING RECOMMENDED. CRITICAL VALUE NOTED.  VALUE IS CONSISTENT WITH PREVIOUSLY REPORTED AND CALLED VALUE.   Basic metabolic panel     Status: Abnormal   Collection Time: 06/14/15  1:31 AM  Result Value Ref Range   Sodium 138 135 - 145 mmol/L   Potassium 3.9 3.5 - 5.1 mmol/L   Chloride 97 (L) 101 - 111 mmol/L   CO2 27 22 - 32 mmol/L   Glucose, Bld  90 65 - 99 mg/dL   BUN 26 (H) 6 - 20 mg/dL   Creatinine, Ser 3.97 (H) 0.44 - 1.00 mg/dL   Calcium 8.2 (L) 8.9 - 10.3 mg/dL   GFR calc non Af Amer 10 (L) >60 mL/min   GFR calc Af Amer 11 (L) >60 mL/min    Comment: (NOTE) The eGFR has been calculated using the CKD EPI equation. This calculation has not been validated in all clinical situations. eGFR's persistently <60 mL/min signify possible Chronic Kidney Disease.    Anion gap 14 5 - 15  CBC     Status: Abnormal   Collection Time: 06/14/15  1:31 AM  Result Value Ref Range   WBC 6.4 4.0 - 10.5 K/uL   RBC 3.41 (L) 3.87 - 5.11 MIL/uL   Hemoglobin 9.8 (L) 12.0 - 15.0 g/dL   HCT 30.5 (L) 36.0 - 46.0 %   MCV 89.4 78.0 - 100.0 fL   MCH 28.7 26.0 - 34.0 pg   MCHC 32.1 30.0 - 36.0 g/dL   RDW 16.9 (H) 11.5 - 15.5 %   Platelets 191 150 - 400 K/uL    Imaging: Dg Chest 2 View  06/13/2015  CLINICAL DATA:  Dry cough.  History of hypertension and CAD. EXAM: CHEST  2 VIEW COMPARISON:  09/22/2014; 10/23/2013; 07/24/2013; 09/14/2010 FINDINGS: Obscuration of the right heart border secondary to worsening right mid lung heterogeneous airspace opacities. Otherwise, grossly unchanged enlarged cardiac silhouette and mediastinal contours with atherosclerotic plaque within the thoracic aorta. Partially calcified bilateral hilar lymph nodes, left greater than right. There is persistent moderate elevation of the right hemidiaphragm. No pleural effusion or pneumothorax. No evidence of edema. Unchanged bones including severe (greater 75%) compression deformity involving a lower thoracic vertebral body with associated focal kyphosis this location. IMPRESSION: 1. Worsening right mid and lower lung heterogeneous airspace opacities - atelectasis versus infiltrate, progressed since  the 08/2014 examination though new compared to the 07/24/2013 examination. As a right perihilar mass could result in a similar appearance, a follow-up chest radiograph in 3 to 4 weeks after  treatment is recommended to ensure resolution. 2. Similar findings of cardiomegaly without definitive evidence of edema. 3. Unchanged moderate elevation of the right hemidiaphragm. Electronically Signed   By: Sandi Mariscal M.D.   On: 06/13/2015 08:11   Ct Head Wo Contrast  06/13/2015  CLINICAL DATA:  Right-sided facial numbness and pain. EXAM: CT HEAD WITHOUT CONTRAST CT MAXILLOFACIAL WITHOUT CONTRAST TECHNIQUE: Multidetector CT imaging of the head and maxillofacial structures were performed using the standard protocol without intravenous contrast. Multiplanar CT image reconstructions of the maxillofacial structures were also generated. COMPARISON:  None. FINDINGS: CT HEAD FINDINGS Bony calvarium appears intact. Mild diffuse cortical atrophy is noted. No mass effect or midline shift is noted. Ventricular size is within normal limits. There is no evidence of mass lesion, hemorrhage or acute infarction. CT MAXILLOFACIAL FINDINGS No evidence of fracture or other bony abnormality. Paranasal sinuses appear normal. Pterygoid plates appear normal. Globes and orbits appear normal. IMPRESSION: Mild diffuse cortical atrophy. No acute intracranial abnormality seen. No significant abnormality seen in the maxillofacial region. Electronically Signed   By: Marijo Conception, M.D.   On: 06/13/2015 09:30   Ct Maxillofacial Wo Cm  06/13/2015  CLINICAL DATA:  Right-sided facial numbness and pain. EXAM: CT HEAD WITHOUT CONTRAST CT MAXILLOFACIAL WITHOUT CONTRAST TECHNIQUE: Multidetector CT imaging of the head and maxillofacial structures were performed using the standard protocol without intravenous contrast. Multiplanar CT image reconstructions of the maxillofacial structures were also generated. COMPARISON:  None. FINDINGS: CT HEAD FINDINGS Bony calvarium appears intact. Mild diffuse cortical atrophy is noted. No mass effect or midline shift is noted. Ventricular size is within normal limits. There is no evidence of mass lesion,  hemorrhage or acute infarction. CT MAXILLOFACIAL FINDINGS No evidence of fracture or other bony abnormality. Paranasal sinuses appear normal. Pterygoid plates appear normal. Globes and orbits appear normal. IMPRESSION: Mild diffuse cortical atrophy. No acute intracranial abnormality seen. No significant abnormality seen in the maxillofacial region. Electronically Signed   By: Marijo Conception, M.D.   On: 06/13/2015 09:30    Assessment:  Principal Problem:   Symptomatic anemia Active Problems:   Hyperlipidemia   Hypertension   Chronic Facial droop   Anemia in chronic kidney disease   End stage renal disease on dialysis ([place])   Anemia due to blood loss, chronic   GAVE (gastric antral vascular ectasia)   NSTEMI (non-ST elevated myocardial infarction) ([place])   Demand ischemia ([facility])   CAD-severe multivessel, not CABG candidate   Moderate to severe pulmonary hypertension ([facility])   Abnormal chest x-ray   Anemia   Plan:  Large troponin elevation likely attributable to demand ischemia in the setting of severe anemia. She is asymptomatic. Agree with re-starting ASA therapy if feasible in the near future, but not at this time. D/W Dr. Ardis Hughs, will obtain an echo today for perioperative risk assessment, but tentative plan for endoscopy tomorrow morning.  Time Spent Directly with Patient:  15 minutes  Length of Stay:  LOS: 1 day   Pixie Casino, MD, [facility] Attending Cardiologist [place] 06/14/2015, 10:13 AM

## 2015-06-14 NOTE — Progress Notes (Signed)
Patient were very upset this morning,that her scheduled EGD was cancelled.Secondly when the nurse explained to her not exerting much walking on the hallway,as the patient tried to walk going to another unit.The writer reinforced what the cardiology told to her this morning.The patient said.''if i'm going to die,its up there if i m going to die,pointing to sky.I have been doing this walking for a long time,now telling me,not to walk around?The writer explained to patient the reasons why we we're doing this to her.Patient went back into her room but very upset and agitated at times yelling out for frustrations.Explained to patient that what she was doing would not helped her.The writer leave the patient alone into her room and the nurse just standby on the hallway to have a patient regained her composure and have time to recollect.Instead,patiet took he breakfast tray from the table and threw it into garbage bin.

## 2015-06-14 NOTE — Progress Notes (Signed)
[facility] Progress Note    Since last GI note: She is very upset that EGD has been cancelled for today.  Ambulating, no abd pain.  + dark stool this AM and 2-3 of them yesterday.   Objective: Vital signs in last 24 hours: Temp:  [97.5 F (36.4 C)-98.7 F (37.1 C)] 98.7 F (37.1 C) (03/18 0457) Pulse Rate:  [71-95] 71 (03/18 0457) Resp:  [18-26] 20 (03/18 0457) BP: (90-208)/(40-119) 134/58 mmHg (03/18 0457) SpO2:  [94 %-98 %] 98 % (03/18 0457) Weight:  [117 lb 6.7 oz (53.26 kg)-118 lb 8 oz (53.751 kg)] 117 lb 6.7 oz (53.26 kg) (03/17 2052) Last BM Date: 06/13/15 General: alert and oriented times 3 Right facial droop (chronic) Heart: regular rate and rythm Abdomen: soft, non-tender, non-distended, normal bowel sounds   Lab Results:  Recent Labs  06/13/15 0741 06/13/15 1649 06/14/15 0131  WBC 6.1 7.9 6.4  HGB 5.9* 8.6* 9.8*  PLT 280 251 191  MCV 95.7 88.2 89.4    Recent Labs  06/13/15 0741 06/13/15 1830 06/14/15 0131  NA 139 137 138  K 5.4* 3.3* 3.9  CL 99* 96* 97*  CO2 25 28 27   GLUCOSE 166* 123* 90  BUN 72* 19 26*  CREATININE 6.70* 3.11* 3.97*  CALCIUM 9.1 8.1* 8.2*    Recent Labs  06/13/15 1830  ALBUMIN 3.1*   Troponin .Marland Kitchen021 then 13.31 then 21.74 over past 24hours   Studies/Results: Dg Chest 2 View  06/13/2015  CLINICAL DATA:  Dry cough.  History of hypertension and CAD. EXAM: CHEST  2 VIEW COMPARISON:  09/22/2014; 10/23/2013; 07/24/2013; 09/14/2010 FINDINGS: Obscuration of the right heart border secondary to worsening right mid lung heterogeneous airspace opacities. Otherwise, grossly unchanged enlarged cardiac silhouette and mediastinal contours with atherosclerotic plaque within the thoracic aorta. Partially calcified bilateral hilar lymph nodes, left greater than right. There is persistent moderate elevation of the right hemidiaphragm. No pleural effusion or pneumothorax. No evidence of edema. Unchanged bones including severe (greater  75%) compression deformity involving a lower thoracic vertebral body with associated focal kyphosis this location. IMPRESSION: 1. Worsening right mid and lower lung heterogeneous airspace opacities - atelectasis versus infiltrate, progressed since the 08/2014 examination though new compared to the 07/24/2013 examination. As a right perihilar mass could result in a similar appearance, a follow-up chest radiograph in 3 to 4 weeks after treatment is recommended to ensure resolution. 2. Similar findings of cardiomegaly without definitive evidence of edema. 3. Unchanged moderate elevation of the right hemidiaphragm. Electronically Signed   By: Sandi Mariscal M.D.   On: 06/13/2015 08:11   Ct Head Wo Contrast  06/13/2015  CLINICAL DATA:  Right-sided facial numbness and pain. EXAM: CT HEAD WITHOUT CONTRAST CT MAXILLOFACIAL WITHOUT CONTRAST TECHNIQUE: Multidetector CT imaging of the head and maxillofacial structures were performed using the standard protocol without intravenous contrast. Multiplanar CT image reconstructions of the maxillofacial structures were also generated. COMPARISON:  None. FINDINGS: CT HEAD FINDINGS Bony calvarium appears intact. Mild diffuse cortical atrophy is noted. No mass effect or midline shift is noted. Ventricular size is within normal limits. There is no evidence of mass lesion, hemorrhage or acute infarction. CT MAXILLOFACIAL FINDINGS No evidence of fracture or other bony abnormality. Paranasal sinuses appear normal. Pterygoid plates appear normal. Globes and orbits appear normal. IMPRESSION: Mild diffuse cortical atrophy. No acute intracranial abnormality seen. No significant abnormality seen in the maxillofacial region. Electronically Signed   By: Marijo Conception, M.D.   On: 06/13/2015 09:30  Ct Maxillofacial Wo Cm  06/13/2015  CLINICAL DATA:  Right-sided facial numbness and pain. EXAM: CT HEAD WITHOUT CONTRAST CT MAXILLOFACIAL WITHOUT CONTRAST TECHNIQUE: Multidetector CT imaging of the  head and maxillofacial structures were performed using the standard protocol without intravenous contrast. Multiplanar CT image reconstructions of the maxillofacial structures were also generated. COMPARISON:  None. FINDINGS: CT HEAD FINDINGS Bony calvarium appears intact. Mild diffuse cortical atrophy is noted. No mass effect or midline shift is noted. Ventricular size is within normal limits. There is no evidence of mass lesion, hemorrhage or acute infarction. CT MAXILLOFACIAL FINDINGS No evidence of fracture or other bony abnormality. Paranasal sinuses appear normal. Pterygoid plates appear normal. Globes and orbits appear normal. IMPRESSION: Mild diffuse cortical atrophy. No acute intracranial abnormality seen. No significant abnormality seen in the maxillofacial region. Electronically Signed   By: Marijo Conception, M.D.   On: 06/13/2015 09:30     Medications: Scheduled Meds: . calcitRIOL  1.25 mcg Oral Q M,W,F-HD  . [START ON 06/18/2015] darbepoetin (ARANESP) injection - DIALYSIS  200 mcg Intravenous Q Wed-HD  . ferric gluconate (FERRLECIT/NULECIT) IV  250 mg Intravenous Q M,W,F-HD  . metoprolol tartrate  12.5 mg Oral BID  . sodium chloride flush  3 mL Intravenous Q12H   Continuous Infusions: . sodium chloride     PRN Meds:.sodium chloride, sodium chloride, acetaminophen **OR** acetaminophen, alteplase, heparin, lidocaine (PF), lidocaine-prilocaine, morphine injection, pentafluoroprop-tetrafluoroeth    Assessment/Plan: 83 y.o. female with significant anemia from chronic GAVE of distal stomach, chronic GI blood loss  Acute rise in troponins over past 24 hours from 0.21 to 21.7 this morning.  Hb has bumped from 5.9 to 9.8 after just 2 units yesterday during HD.  EGD has been cancelled for today pending cardiology evaluation.  I suspect this is 'demand ischemia' from significant anemia but trop rise is pretty impressive and she may have had real damage to her heart.  We're letting her eat  today, will keep NPO after midnight again in hopes that she will be safe to proceed with EGD tomorrow (8:30 case already set for Sunday).    Milus Banister, MD  06/14/2015, 10:01 AM [facility] Pager [phone]

## 2015-06-14 NOTE — Progress Notes (Signed)
[facility] Progress Note   Dialysis Orders: [facility] MWF 3 hr 350/ 800 EDW 51 - leaving at 50.5 2 K 2 Ca profile 4 left upper AVF heaprin 2000 venofer 50 per week - Mircera 225 last had 200 3/8 calctiriol 1.5 Recent labs: hgb 7.2 10% sat ferritin 111 iPTH 103 3/15  Assessment/Plan: 1. Recurrent GIB - likely due to GAVE - for endoscopy today 2. STEMI/Multivessel CAD no amendable to [facility] 03/2015 cath - borderline candidate - pt declined; it would be much better for her heart if she ran longer treatments and had lower UF rates. Serial Troponins ordered; full code Cards following - BB added. 3. ESRD - MWF - on HD - she really would benefit cardiac wise for longer dialysis; but is resistant - K corrected with HD; She needs to have dialysis related heparin discontinued permanently Next HD Monday 4. Hypertension/volume - getting below edw at 50.5; she is not hypovolemic, but her hgb is low and likely induced ischemia- BB started. Net UF Friday only 470 -- she is above EDW but seems ok 5. Anemia - Hgb 5.9 on admission- s/p  2 units PRBC -Hgb up to 9.8 - replete Fe and continue ESA,  Need to transfuse for all hgb < 8.5 given cardiac/GI history. 6. Metabolic bone disease - iPTH down this month to 103 after increase in calcitriol from 1 to 1.5 - have reduced dose to 1.25 - need to change at discharge-  7. Nutrition - renal diet.renavite/  Myriam Jacobson, PA-C [place] [phone] 06/14/2015,9:11 AM  LOS: 1 day  o Renal Attending: Endo cancelled for today, possib;e Sunday.  Otherwise feels well.  I, otherwise, agree with note above Demetrion Wesby C   Subjective:   Irritable about a number of things regarding her care.  Objective Filed Vitals:   06/13/15 1703 06/13/15 1856 06/13/15 2052 06/14/15 0457  BP: 122/79 90/40 106/46 134/58  Pulse: 80 74 77 71  Temp:  97.8 F (36.6 C) 98.2 F (36.8 C) 98.7 F (37.1 C)  TempSrc:  Oral Oral Oral  Resp: 20 18  19 20   Height:      Weight:  53.751 kg (118 lb 8 oz) 53.26 kg (117 lb 6.7 oz)   SpO2:  94% 96% 98%   Physical Exam General: NAD sitting in chair, quite talkative Heart: RRR 2/6 murmur Lungs: no rales Abdomen: soft NT Extremities: no edema Dialysis Access: left upper AVF + bruit   Additional Objective Labs: Basic Metabolic Panel:  Recent Labs Lab 06/13/15 0741 06/13/15 1830 06/14/15 0131  NA 139 137 138  K 5.4* 3.3* 3.9  CL 99* 96* 97*  CO2 25 28 27   GLUCOSE 166* 123* 90  BUN 72* 19 26*  CREATININE 6.70* 3.11* 3.97*  CALCIUM 9.1 8.1* 8.2*  PHOS  --  2.8  --    Liver Function Tests:  Recent Labs Lab 06/13/15 1830  ALBUMIN 3.1*   CBC:  Recent Labs Lab 06/13/15 0741 06/13/15 1649 06/14/15 0131  WBC 6.1 7.9 6.4  NEUTROABS 4.1  --   --   HGB 5.9* 8.6* 9.8*  HCT 20.1* 26.8* 30.5*  MCV 95.7 88.2 89.4  PLT 280 251 191    Cardiac Enzymes:  Recent Labs Lab 06/13/15 0741 06/13/15 1830 06/14/15 0131  TROPONINI 0.21* 16.31* 21.74*   CBG:  Recent Labs Lab 06/13/15 0509  GLUCAP 194*    Studies/Results: Dg Chest 2 View  06/13/2015  CLINICAL DATA:  Dry cough.  History of hypertension and CAD. EXAM: CHEST  2 VIEW COMPARISON:  09/22/2014; 10/23/2013; 07/24/2013; 09/14/2010 FINDINGS: Obscuration of the right heart border secondary to worsening right mid lung heterogeneous airspace opacities. Otherwise, grossly unchanged enlarged cardiac silhouette and mediastinal contours with atherosclerotic plaque within the thoracic aorta. Partially calcified bilateral hilar lymph nodes, left greater than right. There is persistent moderate elevation of the right hemidiaphragm. No pleural effusion or pneumothorax. No evidence of edema. Unchanged bones including severe (greater 75%) compression deformity involving a lower thoracic vertebral body with associated focal kyphosis this location. IMPRESSION: 1. Worsening right mid and lower lung heterogeneous airspace opacities -  atelectasis versus infiltrate, progressed since the 08/2014 examination though new compared to the 07/24/2013 examination. As a right perihilar mass could result in a similar appearance, a follow-up chest radiograph in 3 to 4 weeks after treatment is recommended to ensure resolution. 2. Similar findings of cardiomegaly without definitive evidence of edema. 3. Unchanged moderate elevation of the right hemidiaphragm. Electronically Signed   By: Sandi Mariscal M.D.   On: 06/13/2015 08:11   Ct Head Wo Contrast  06/13/2015  CLINICAL DATA:  Right-sided facial numbness and pain. EXAM: CT HEAD WITHOUT CONTRAST CT MAXILLOFACIAL WITHOUT CONTRAST TECHNIQUE: Multidetector CT imaging of the head and maxillofacial structures were performed using the standard protocol without intravenous contrast. Multiplanar CT image reconstructions of the maxillofacial structures were also generated. COMPARISON:  None. FINDINGS: CT HEAD FINDINGS Bony calvarium appears intact. Mild diffuse cortical atrophy is noted. No mass effect or midline shift is noted. Ventricular size is within normal limits. There is no evidence of mass lesion, hemorrhage or acute infarction. CT MAXILLOFACIAL FINDINGS No evidence of fracture or other bony abnormality. Paranasal sinuses appear normal. Pterygoid plates appear normal. Globes and orbits appear normal. IMPRESSION: Mild diffuse cortical atrophy. No acute intracranial abnormality seen. No significant abnormality seen in the maxillofacial region. Electronically Signed   By: Marijo Conception, M.D.   On: 06/13/2015 09:30   Ct Maxillofacial Wo Cm  06/13/2015  CLINICAL DATA:  Right-sided facial numbness and pain. EXAM: CT HEAD WITHOUT CONTRAST CT MAXILLOFACIAL WITHOUT CONTRAST TECHNIQUE: Multidetector CT imaging of the head and maxillofacial structures were performed using the standard protocol without intravenous contrast. Multiplanar CT image reconstructions of the maxillofacial structures were also generated.  COMPARISON:  None. FINDINGS: CT HEAD FINDINGS Bony calvarium appears intact. Mild diffuse cortical atrophy is noted. No mass effect or midline shift is noted. Ventricular size is within normal limits. There is no evidence of mass lesion, hemorrhage or acute infarction. CT MAXILLOFACIAL FINDINGS No evidence of fracture or other bony abnormality. Paranasal sinuses appear normal. Pterygoid plates appear normal. Globes and orbits appear normal. IMPRESSION: Mild diffuse cortical atrophy. No acute intracranial abnormality seen. No significant abnormality seen in the maxillofacial region. Electronically Signed   By: Marijo Conception, M.D.   On: 06/13/2015 09:30   Medications: . sodium chloride     . calcitRIOL  1.25 mcg Oral Q M,W,F-HD  . [START ON 06/18/2015] darbepoetin (ARANESP) injection - DIALYSIS  200 mcg Intravenous Q Wed-HD  . ferric gluconate (FERRLECIT/NULECIT) IV  250 mg Intravenous Q M,W,F-HD  . metoprolol tartrate  12.5 mg Oral BID  . sodium chloride flush  3 mL Intravenous Q12H

## 2015-06-14 NOTE — Progress Notes (Signed)
  Echocardiogram 2D Echocardiogram has been performed.  [name] 06/14/2015, 11:37 AM

## 2015-06-14 NOTE — Progress Notes (Signed)
Due to acute troponin elevation the egd is cancelled.  Pt can eat today.  hgb up to 9.8 after PRBCs.   Dr Ardis Hughs to follow.   Azucena Freed PA-C

## 2015-06-14 NOTE — Progress Notes (Signed)
[facility] PROGRESS NOTE  [name] [id number] DOB: Aug 22, 1931 DOA: 06/13/2015  PCP: [name], MD  Brief HPI: 82 year old Caucasian female with the past medical history of chronic right-sided facial droop since childhood, end-stage renal disease on dialysis, known GAVE, chronic anemia due to chronic blood loss, presented with complaints of facial pain with nausea. Had noted dark stool but denied any black stool. No blood in the stool. She was found to be anemic with a hemoglobin of 5.9. She was hospitalized for further management. She was found to have mildly elevated troponin with significant bump overnight. Cardiology is following.  Past medical history:  Past Medical History  Diagnosis Date  . Hyperlipidemia   . Hypertension   . Anxiety   . Facial droop 1935    acquired during forceps delivery, some residual R visual loss  . Hx of breast cancer 2004  . Arthritis     mild in lower back (Drs Posey Pronto and Joelyn Oms)  . GERD (gastroesophageal reflux disease)   . Bradycardia 2014    a. Noted 06/2012.  Marland Kitchen Hypotension     a. Associated w/ dialysis.  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 [name])  . Osteoporosis 11/2013    DEXA -3.4 radius  . GAVE (gastric antral vascular ectasia) 08/2014    s/p EGD with APC  . Anemia in chronic kidney disease     IV iron infusion and anemia from GAVE blood loss.  . End stage renal disease on dialysis [facility])     HD M,W,F [place] AVF now LUE AVF  . Breast cancer ([place])     Left Breast 12 yrs. ago  . Hearing loss     Bilateral hearing aids  . Sight impaired     Left: wears contact. Right: diminished peripheral vision  . Cardiac murmur     a. thought due to AVF (2D echo 06/2012 without significant valvular disease).   Marland Kitchen CAD (coronary artery disease), native coronary artery     3 vessel CAD by cath 04/2015    Consultants: Cardiology. Gastroenterology  Procedures: EGD planned for this  morning was canceled due to significantly elevated troponin  Antibiotics: None  Subjective: Patient is upset that she cannot have endoscopy today. She otherwise denies any chest pain, shortness of breath, abdominal pain, nausea, vomiting or diarrhea. Facial pain has improved.   Objective: Vital Signs  Filed Vitals:   06/13/15 1856 06/13/15 2052 06/14/15 0457 06/14/15 1120  BP: 90/40 106/46 134/58 138/48  Pulse: 74 77 71 80  Temp: 97.8 F (36.6 C) 98.2 F (36.8 C) 98.7 F (37.1 C) 98.6 F (37 C)  TempSrc: Oral Oral Oral Oral  Resp: 18 19 20 19   Height:      Weight: 53.751 kg (118 lb 8 oz) 53.26 kg (117 lb 6.7 oz)    SpO2: 94% 96% 98% 96%    Intake/Output Summary (Last 24 hours) at 06/14/15 1128 Last data filed at 06/14/15 1121  Gross per 24 hour  Intake    790 ml  Output    470 ml  Net    320 ml   Filed Weights   06/13/15 1057 06/13/15 1856 06/13/15 2052  Weight: 53.3 kg (117 lb 8.1 oz) 53.751 kg (118 lb 8 oz) 53.26 kg (117 lb 6.7 oz)    General appearance: alert, cooperative, appears stated age and no distress Resp: clear to auscultation bilaterally Cardio: regular rate and rhythm, S1, S2  normal, no murmur, click, rub or gallop GI: soft, non-tender; bowel sounds normal; no masses,  no organomegaly Extremities: extremities normal, atraumatic, no cyanosis or edema Neurologic: Right-sided facial droop is chronic, per previous records. No focal deficits noted otherwise.  Lab Results:  Basic Metabolic Panel:  Recent Labs Lab 06/13/15 0741 06/13/15 1830 06/14/15 0131  NA 139 137 138  K 5.4* 3.3* 3.9  CL 99* 96* 97*  CO2 25 28 27   GLUCOSE 166* 123* 90  BUN 72* 19 26*  CREATININE 6.70* 3.11* 3.97*  CALCIUM 9.1 8.1* 8.2*  PHOS  --  2.8  --    Liver Function Tests:  Recent Labs Lab 06/13/15 1830  ALBUMIN 3.1*   CBC:  Recent Labs Lab 06/13/15 0741 06/13/15 1649 06/14/15 0131  WBC 6.1 7.9 6.4  NEUTROABS 4.1  --   --   HGB 5.9* 8.6* 9.8*  HCT 20.1*  26.8* 30.5*  MCV 95.7 88.2 89.4  PLT 280 251 191   Cardiac Enzymes:  Recent Labs Lab 06/13/15 0741 06/13/15 1830 06/14/15 0131  TROPONINI 0.21* 16.31* 21.74*   CBG:  Recent Labs Lab 06/13/15 0509  GLUCAP 194*    Recent Results (from the past 240 hour(s))  MRSA PCR Screening     Status: None   Collection Time: 06/13/15  6:50 PM  Result Value Ref Range Status   MRSA by PCR NEGATIVE NEGATIVE Final    Comment:        The GeneXpert MRSA Assay (FDA approved for NASAL specimens only), is one component of a comprehensive MRSA colonization surveillance program. It is not intended to diagnose MRSA infection nor to guide or monitor treatment for MRSA infections.       Studies/Results: Dg Chest 2 View  06/13/2015  CLINICAL DATA:  Dry cough.  History of hypertension and CAD. EXAM: CHEST  2 VIEW COMPARISON:  09/22/2014; 10/23/2013; 07/24/2013; 09/14/2010 FINDINGS: Obscuration of the right heart border secondary to worsening right mid lung heterogeneous airspace opacities. Otherwise, grossly unchanged enlarged cardiac silhouette and mediastinal contours with atherosclerotic plaque within the thoracic aorta. Partially calcified bilateral hilar lymph nodes, left greater than right. There is persistent moderate elevation of the right hemidiaphragm. No pleural effusion or pneumothorax. No evidence of edema. Unchanged bones including severe (greater 75%) compression deformity involving a lower thoracic vertebral body with associated focal kyphosis this location. IMPRESSION: 1. Worsening right mid and lower lung heterogeneous airspace opacities - atelectasis versus infiltrate, progressed since the 08/2014 examination though new compared to the 07/24/2013 examination. As a right perihilar mass could result in a similar appearance, a follow-up chest radiograph in 3 to 4 weeks after treatment is recommended to ensure resolution. 2. Similar findings of cardiomegaly without definitive evidence of  edema. 3. Unchanged moderate elevation of the right hemidiaphragm. Electronically Signed   By: Sandi Mariscal M.D.   On: 06/13/2015 08:11   Ct Head Wo Contrast  06/13/2015  CLINICAL DATA:  Right-sided facial numbness and pain. EXAM: CT HEAD WITHOUT CONTRAST CT MAXILLOFACIAL WITHOUT CONTRAST TECHNIQUE: Multidetector CT imaging of the head and maxillofacial structures were performed using the standard protocol without intravenous contrast. Multiplanar CT image reconstructions of the maxillofacial structures were also generated. COMPARISON:  None. FINDINGS: CT HEAD FINDINGS Bony calvarium appears intact. Mild diffuse cortical atrophy is noted. No mass effect or midline shift is noted. Ventricular size is within normal limits. There is no evidence of mass lesion, hemorrhage or acute infarction. CT MAXILLOFACIAL FINDINGS No evidence of fracture or other bony  abnormality. Paranasal sinuses appear normal. Pterygoid plates appear normal. Globes and orbits appear normal. IMPRESSION: Mild diffuse cortical atrophy. No acute intracranial abnormality seen. No significant abnormality seen in the maxillofacial region. Electronically Signed   By: Marijo Conception, M.D.   On: 06/13/2015 09:30   Ct Maxillofacial Wo Cm  06/13/2015  CLINICAL DATA:  Right-sided facial numbness and pain. EXAM: CT HEAD WITHOUT CONTRAST CT MAXILLOFACIAL WITHOUT CONTRAST TECHNIQUE: Multidetector CT imaging of the head and maxillofacial structures were performed using the standard protocol without intravenous contrast. Multiplanar CT image reconstructions of the maxillofacial structures were also generated. COMPARISON:  None. FINDINGS: CT HEAD FINDINGS Bony calvarium appears intact. Mild diffuse cortical atrophy is noted. No mass effect or midline shift is noted. Ventricular size is within normal limits. There is no evidence of mass lesion, hemorrhage or acute infarction. CT MAXILLOFACIAL FINDINGS No evidence of fracture or other bony abnormality.  Paranasal sinuses appear normal. Pterygoid plates appear normal. Globes and orbits appear normal. IMPRESSION: Mild diffuse cortical atrophy. No acute intracranial abnormality seen. No significant abnormality seen in the maxillofacial region. Electronically Signed   By: Marijo Conception, M.D.   On: 06/13/2015 09:30    Medications:  Scheduled: . ALPRAZolam  0.25 mg Oral Once  . calcitRIOL  1.25 mcg Oral Q M,W,F-HD  . [START ON 06/18/2015] darbepoetin (ARANESP) injection - DIALYSIS  200 mcg Intravenous Q Wed-HD  . ferric gluconate (FERRLECIT/NULECIT) IV  250 mg Intravenous Q M,W,F-HD  . metoprolol tartrate  12.5 mg Oral BID  . sodium chloride flush  3 mL Intravenous Q12H   Continuous: . sodium chloride     [id number] chloride, sodium chloride, acetaminophen **OR** acetaminophen, alteplase, heparin, lidocaine (PF), lidocaine-prilocaine, morphine injection, pentafluoroprop-tetrafluoroeth  Assessment/Plan:  Principal Problem:   Symptomatic anemia Active Problems:   Hyperlipidemia   Hypertension   Chronic Facial droop   Anemia in chronic kidney disease   End stage renal disease on dialysis (HCC)   Anemia due to blood loss, chronic   GAVE (gastric antral vascular ectasia)   NSTEMI (non-ST elevated myocardial infarction) ([place])   Demand ischemia (HCC)   CAD-severe multivessel, not CABG candidate   Moderate to severe pulmonary hypertension (HCC)   Abnormal chest x-ray   Anemia    Symptomatic anemia/anemia of chronic blood loss Patient reports has had chronic melena for several months with presumed source from GAVE (see below). Patient was transfused 2 units of blood with improvement in hemoglobin. Continue to monitor for now.  GAVE Seen by gastroenterology. Plan was for EGD today. However, due to significant rise in troponin. This has been canceled. Cardiology will clear after echocardiogram is obtained. PPI.   Demand ischemia/CAD-severe multivessel, not CABG candidate Significant  rise in troponin overnight. Patient denies any chest pain. Cardiology is following. Demand ischemia still thought to be the primary reason. Echocardiogram has been ordered. Continue beta blocker. Facial pain could have been an angina equivalent.  Essential Hypertension Current blood pressure controlled  End stage renal disease on dialysis  Dialyzes on MWF. Nephrology is following.  Moderate to severe pulmonary hypertension  Patient reports chronic dyspnea on exertion made worse by anemia. Continue medical management  Abnormal chest x-ray Worsening right mid and lower lung airspace opacities atelectasis versus infiltrate and progressed since June 2016 but new compared to exam for 2815-radiologist suggests that a right perihilar mass could result in a similar appearance and recommended repeat chest x-ray in 3-4 weeks. Discussed with patient. Proceed with CT scan of the chest  with contrast during this admission. Discussed with Dr. Florene Glen with nephrology.  Hyperlipidemia Preadmission medications on hold  Chronic Facial droop  DVT Prophylaxis: SCDs    Code Status: Full Code  Family Communication: Discussed with the patient  Disposition Plan: Continue management as outlined above. EGD postponed to tomorrow. Echocardiogram is pending    LOS: 1 day   [facility] Pager [phone] 06/14/2015, 11:28 AM  If 7PM-7AM, please contact night-coverage at [email or web address], password [facility]

## 2015-06-15 ENCOUNTER — Encounter (HOSPITAL_COMMUNITY): Payer: Self-pay

## 2015-06-15 ENCOUNTER — Encounter (HOSPITAL_COMMUNITY)
Admission: EM | Disposition: A | Discharge status: Home / Self Care | Payer: Self-pay | Source: Home / Self Care | Attending: Internal Medicine

## 2015-06-15 DIAGNOSIS — K31811 Angiodysplasia of stomach and duodenum with bleeding: Principal | ICD-10-CM

## 2015-06-15 HISTORY — PX: ESOPHAGOGASTRODUODENOSCOPY: SHX5428

## 2015-06-15 LAB — CBC
HEMATOCRIT: 31.8 % — AB (ref 36.0–46.0)
Hemoglobin: 9.8 g/dL — ABNORMAL LOW (ref 12.0–15.0)
MCH: 28 pg (ref 26.0–34.0)
MCHC: 30.8 g/dL (ref 30.0–36.0)
MCV: 90.9 fL (ref 78.0–100.0)
Platelets: 241 10*3/uL (ref 150–400)
RBC: 3.5 MIL/uL — ABNORMAL LOW (ref 3.87–5.11)
RDW: 17 % — AB (ref 11.5–15.5)
WBC: 6.4 10*3/uL (ref 4.0–10.5)

## 2015-06-15 LAB — BASIC METABOLIC PANEL
Anion gap: 16 — ABNORMAL HIGH (ref 5–15)
BUN: 47 mg/dL — AB (ref 6–20)
CALCIUM: 8.5 mg/dL — AB (ref 8.9–10.3)
CO2: 26 mmol/L (ref 22–32)
CREATININE: 6.37 mg/dL — AB (ref 0.44–1.00)
Chloride: 95 mmol/L — ABNORMAL LOW (ref 101–111)
GFR calc non Af Amer: 5 mL/min — ABNORMAL LOW (ref >60)
GFR, EST AFRICAN AMERICAN: 6 mL/min — AB (ref >60)
GLUCOSE: 94 mg/dL (ref 65–99)
Potassium: 4.1 mmol/L (ref 3.5–5.1)
Sodium: 137 mmol/L (ref 135–145)

## 2015-06-15 SURGERY — EGD (ESOPHAGOGASTRODUODENOSCOPY)
Anesthesia: Moderate Sedation

## 2015-06-15 MED ORDER — FENTANYL CITRATE (PF) 100 MCG/2ML IJ SOLN
INTRAMUSCULAR | Status: DC | PRN
  Administered 2015-06-15 (×3): 12.5 ug via INTRAVENOUS

## 2015-06-15 MED ORDER — CALCITRIOL 0.25 MCG PO CAPS: 1.2500 ug | ORAL_CAPSULE | ORAL | Status: DC

## 2015-06-15 MED ORDER — GLUCAGON HCL (RDNA) 1 MG IJ SOLR
INTRAMUSCULAR | Status: DC | PRN
  Administered 2015-06-15: .5 mg via INTRAVENOUS

## 2015-06-15 MED ORDER — FENTANYL CITRATE (PF) 100 MCG/2ML IJ SOLN
INTRAMUSCULAR | Status: AC
  Filled 2015-06-15: qty 2

## 2015-06-15 MED ORDER — GLUCAGON HCL RDNA (DIAGNOSTIC) 1 MG IJ SOLR
INTRAMUSCULAR | Status: AC
  Filled 2015-06-15: qty 1

## 2015-06-15 MED ORDER — MIDAZOLAM HCL 5 MG/ML IJ SOLN
INTRAMUSCULAR | Status: AC
  Filled 2015-06-15: qty 2

## 2015-06-15 MED ORDER — ASPIRIN 81 MG PO CHEW: 81.0000 mg | CHEWABLE_TABLET | Freq: Every day | ORAL | Status: DC

## 2015-06-15 MED ORDER — DIPHENHYDRAMINE HCL 50 MG/ML IJ SOLN
INTRAMUSCULAR | Status: AC
  Filled 2015-06-15: qty 1

## 2015-06-15 MED ORDER — MIDAZOLAM HCL 10 MG/2ML IJ SOLN
INTRAMUSCULAR | Status: DC | PRN
  Administered 2015-06-15 (×3): 1 mg via INTRAVENOUS

## 2015-06-15 MED ORDER — METOPROLOL TARTRATE 25 MG PO TABS: 12.5000 mg | ORAL_TABLET | Freq: Two times a day (BID) | ORAL | Status: DC

## 2015-06-15 NOTE — Op Note (Signed)
[facility] Patient Name: [name] Procedure Date : 06/15/2015 MRN: [id number] Attending MD: Milus Banister , MD Date of Birth: 22-Jan-1932 CSN: [id number] Age: 82 Admit Type: Inpatient Procedure:                Upper GI endoscopy Indications:              Melena (h/o GAVE, has undergone EGD with APC 4                            times in 2016 with Dr. Deatra Ina; is currently on                            schedule for EGD 3/28 with Dr. Silverio Decamp but                            presented with signfiicant worsening of her chronic                            anemia) Providers:                Milus Banister, MD, Laverta Baltimore, RN, Cherylynn Ridges, Technician Referring MD:              Medicines:                Fentanyl 37.5 micrograms IV, Midazolam 3 mg IV,                            glucagon 0.5mg  IV Complications:            No immediate complications. Estimated Blood Loss:     Estimated blood loss: none. Procedure:                Pre-Anesthesia Assessment:                           - Prior to the procedure, a History and Physical                            was performed, and patient medications and                            allergies were reviewed. The patient's tolerance of                            previous anesthesia was also reviewed. The risks                            and benefits of the procedure and the sedation                            options and risks were discussed with the patient.  All questions were answered, and informed consent                            was obtained. Prior Anticoagulants: The patient has                            taken aspirin. ASA Grade Assessment: III - A                            patient with severe systemic disease. After                            reviewing the risks and benefits, the patient was                            deemed in satisfactory condition to undergo the                           procedure.                           After obtaining informed consent, the endoscope was                            passed under direct vision. Throughout the                            procedure, the patient's blood pressure, pulse, and                            oxygen saturations were monitored continuously. The                            [id number]) scope was introduced through the                            mouth, and advanced to the second part of duodenum.                            The upper GI endoscopy was accomplished without                            difficulty. The patient tolerated the procedure                            well. Scope In: Scope Out: Findings:      The esophagus was normal.      Gastric antral vascular ectasia with recent bleeding (small amount of       thin red blood was seen in the distal stomach) was present in the distal       stomach. APC was used to treat the GAVE, successfully.      The examined duodenum was normal. Impression:               - Normal esophagus.                           -  Gastric antral vascular ectasia with bleeding.                            This was treated with application of APC.                           - Normal examined duodenum.                           - No specimens collected. Moderate Sedation:      see above Recommendation:           - Return patient to hospital ward for possible                            discharge same day.                           - Patient has a contact number available for                            emergencies. The signs and symptoms of potential                            delayed complications were discussed with the                            patient. Return to normal activities tomorrow.                            Written discharge instructions were provided to the                            patient.                           - Resume previous diet.                            - Continue present medications. OK to restart ASA                            91mg  daily.                           - You should still plan to have repeat EGD with Dr.                            Silverio Decamp that is already scheduled for 3/28. Procedure Code(s):        --- Professional ---                           [phone], Esophagogastroduodenoscopy, flexible,                            transoral; diagnostic, including collection of  specimen(s) by brushing or washing, when performed                            (separate procedure) Diagnosis Code(s):        --- Professional ---                           K31.811, Angiodysplasia of stomach and duodenum                            with bleeding                           K92.1, Melena (includes Hematochezia) CPT copyright 2016 [employer]. All rights reserved. The codes documented in this report are preliminary and upon coder review may  be revised to meet current compliance requirements. Milus Banister, MD Milus Banister, MD 06/15/2015 8:57:31 AM Number of Addenda: 0

## 2015-06-15 NOTE — Progress Notes (Signed)
DAILY PROGRESS NOTE  Subjective:  Troponin rose to 21.74 yesterday, but echo shows normal LV function without regional wall motion abnormalities. Suspicious more for extensive "demand ischemia".  Regardless, she is asymptomatic.  EGD shows GAVE. Ok to resume aspirin.  Objective:  Temp:  [97.7 F (36.5 C)-98.5 F (36.9 C)] 97.7 F (36.5 C) (03/19 0939) Pulse Rate:  [73-82] 74 (03/19 0939) Resp:  [15-33] 19 (03/19 0939) BP: (105-186)/(32-85) 139/62 mmHg (03/19 0939) SpO2:  [96 %-100 %] 100 % (03/19 0939) Weight:  [117 lb 1 oz (53.1 kg)] 117 lb 1 oz (53.1 kg) (03/18 1946) Weight change: -7.1 oz (-0.2 kg)  Intake/Output from previous day: 03/18 0701 - 03/19 0700 In: 720 [P.O.:720] Out: 0   Intake/Output from this shift:    Medications: Current Facility-Administered Medications  Medication Dose Route Frequency Provider Last Rate Last Dose  . 0.9 %  sodium chloride infusion  100 mL Intravenous PRN Alric Seton, PA-C      . 0.9 %  sodium chloride infusion  100 mL Intravenous PRN Alric Seton, PA-C      . acetaminophen (TYLENOL) tablet 650 mg  650 mg Oral Q6H PRN Samella Parr, NP       Or  . acetaminophen (TYLENOL) suppository 650 mg  650 mg Rectal Q6H PRN Samella Parr, NP      . alteplase (CATHFLO ACTIVASE) injection 2 mg  2 mg Intracatheter Once PRN Alric Seton, PA-C      . calcitRIOL (ROCALTROL) capsule 1.25 mcg  1.25 mcg Oral Q M,W,F-HD Alric Seton, PA-C   1.25 mcg at 06/13/15 2016  . [START ON 06/20/2015] Darbepoetin Alfa (ARANESP) injection 200 mcg  200 mcg Intravenous Q Fri-HD Bonnielee Haff, MD      . ferric gluconate (NULECIT) 250 mg in sodium chloride 0.9 % 100 mL IVPB  250 mg Intravenous Q M,W,F-HD Alric Seton, PA-C   250 mg at 06/13/15 1500  . heparin injection 1,000 Units  1,000 Units Dialysis PRN Alric Seton, PA-C      . lidocaine (PF) (XYLOCAINE) 1 % injection 5 mL  5 mL Intradermal PRN Alric Seton, PA-C      . lidocaine-prilocaine (EMLA)  cream 1 application  1 application Topical PRN Alric Seton, PA-C      . metoprolol tartrate (LOPRESSOR) tablet 12.5 mg  12.5 mg Oral BID Arbutus Leas, NP   12.5 mg at 06/15/15 1049  . morphine 2 MG/ML injection 1 mg  1 mg Intravenous Q2H PRN Samella Parr, NP      . pantoprazole (PROTONIX) injection 40 mg  40 mg Intravenous Q12H Bonnielee Haff, MD   40 mg at 06/15/15 1049  . pentafluoroprop-tetrafluoroeth (GEBAUERS) aerosol 1 application  1 application Topical PRN Alric Seton, PA-C      . sodium chloride flush (NS) 0.9 % injection 3 mL  3 mL Intravenous Q12H Samella Parr, NP   3 mL at 06/15/15 1049    Physical Exam: General appearance: alert and no distress Lungs: diminished breath sounds bibasilar and rales bibasilar Heart: regular rate and rhythm, S1, S2 normal, no murmur, click, rub or gallop Extremities: extremities normal, atraumatic, no cyanosis or edema Neurologic: Grossly normal  Lab Results: Results for orders placed or performed during the hospital encounter of 06/13/15 (from the past 48 hour(s))  CBC     Status: Abnormal   Collection Time: 06/13/15  4:49 PM  Result Value Ref Range   WBC 7.9 4.0 - 10.5 K/uL  RBC 3.04 (L) 3.87 - 5.11 MIL/uL   Hemoglobin 8.6 (L) 12.0 - 15.0 g/dL    Comment: REPEATED TO VERIFY POST TRANSFUSION SPECIMEN DELTA CHECK NOTED    HCT 26.8 (L) 36.0 - 46.0 %   MCV 88.2 78.0 - 100.0 fL    Comment: REPEATED TO VERIFY POST TRANSFUSION SPECIMEN DELTA CHECK NOTED    MCH 28.3 26.0 - 34.0 pg   MCHC 32.1 30.0 - 36.0 g/dL   RDW 16.5 (H) 11.5 - 15.5 %   Platelets 251 150 - 400 K/uL  Troponin I (q 6hr x 3)     Status: Abnormal   Collection Time: 06/13/15  6:30 PM  Result Value Ref Range   Troponin I 16.31 (HH) <0.031 ng/mL    Comment:        POSSIBLE MYOCARDIAL ISCHEMIA. SERIAL TESTING RECOMMENDED. CRITICAL RESULT CALLED TO, READ BACK BY AND VERIFIED WITHEd Blalock RN [phone] 1931 [address] R   Renal function panel     Status: Abnormal    Collection Time: 06/13/15  6:30 PM  Result Value Ref Range   Sodium 137 135 - 145 mmol/L   Potassium 3.3 (L) 3.5 - 5.1 mmol/L    Comment: DELTA CHECK NOTED   Chloride 96 (L) 101 - 111 mmol/L   CO2 28 22 - 32 mmol/L   Glucose, Bld 123 (H) 65 - 99 mg/dL   BUN 19 6 - 20 mg/dL   Creatinine, Ser 3.11 (H) 0.44 - 1.00 mg/dL    Comment: DIALYSIS   Calcium 8.1 (L) 8.9 - 10.3 mg/dL   Phosphorus 2.8 2.5 - 4.6 mg/dL   Albumin 3.1 (L) 3.5 - 5.0 g/dL   GFR calc non Af Amer 13 (L) >60 mL/min   GFR calc Af Amer 15 (L) >60 mL/min    Comment: (NOTE) The eGFR has been calculated using the CKD EPI equation. This calculation has not been validated in all clinical situations. eGFR's persistently <60 mL/min signify possible Chronic Kidney Disease.    Anion gap 13 5 - 15  MRSA PCR Screening     Status: None   Collection Time: 06/13/15  6:50 PM  Result Value Ref Range   MRSA by PCR NEGATIVE NEGATIVE    Comment:        The GeneXpert MRSA Assay (FDA approved for NASAL specimens only), is one component of a comprehensive MRSA colonization surveillance program. It is not intended to diagnose MRSA infection nor to guide or monitor treatment for MRSA infections.   Troponin I (q 6hr x 3)     Status: Abnormal   Collection Time: 06/14/15  1:31 AM  Result Value Ref Range   Troponin I 21.74 (HH) <0.031 ng/mL    Comment:        POSSIBLE MYOCARDIAL ISCHEMIA. SERIAL TESTING RECOMMENDED. CRITICAL VALUE NOTED.  VALUE IS CONSISTENT WITH PREVIOUSLY REPORTED AND CALLED VALUE.   Basic metabolic panel     Status: Abnormal   Collection Time: 06/14/15  1:31 AM  Result Value Ref Range   Sodium 138 135 - 145 mmol/L   Potassium 3.9 3.5 - 5.1 mmol/L   Chloride 97 (L) 101 - 111 mmol/L   CO2 27 22 - 32 mmol/L   Glucose, Bld 90 65 - 99 mg/dL   BUN 26 (H) 6 - 20 mg/dL   Creatinine, Ser 3.97 (H) 0.44 - 1.00 mg/dL   Calcium 8.2 (L) 8.9 - 10.3 mg/dL   GFR calc non Af Amer 10 (L) >60 mL/min  GFR calc Af Amer 11 (L)  >60 mL/min    Comment: (NOTE) The eGFR has been calculated using the CKD EPI equation. This calculation has not been validated in all clinical situations. eGFR's persistently <60 mL/min signify possible Chronic Kidney Disease.    Anion gap 14 5 - 15  CBC     Status: Abnormal   Collection Time: 06/14/15  1:31 AM  Result Value Ref Range   WBC 6.4 4.0 - 10.5 K/uL   RBC 3.41 (L) 3.87 - 5.11 MIL/uL   Hemoglobin 9.8 (L) 12.0 - 15.0 g/dL   HCT 30.5 (L) 36.0 - 46.0 %   MCV 89.4 78.0 - 100.0 fL   MCH 28.7 26.0 - 34.0 pg   MCHC 32.1 30.0 - 36.0 g/dL   RDW 16.9 (H) 11.5 - 15.5 %   Platelets 191 150 - 400 K/uL  CBC     Status: Abnormal   Collection Time: 06/15/15  5:39 AM  Result Value Ref Range   WBC 6.4 4.0 - 10.5 K/uL   RBC 3.50 (L) 3.87 - 5.11 MIL/uL   Hemoglobin 9.8 (L) 12.0 - 15.0 g/dL   HCT 31.8 (L) 36.0 - 46.0 %   MCV 90.9 78.0 - 100.0 fL   MCH 28.0 26.0 - 34.0 pg   MCHC 30.8 30.0 - 36.0 g/dL   RDW 17.0 (H) 11.5 - 15.5 %   Platelets 241 150 - 400 K/uL  Basic metabolic panel     Status: Abnormal   Collection Time: 06/15/15  5:39 AM  Result Value Ref Range   Sodium 137 135 - 145 mmol/L   Potassium 4.1 3.5 - 5.1 mmol/L   Chloride 95 (L) 101 - 111 mmol/L   CO2 26 22 - 32 mmol/L   Glucose, Bld 94 65 - 99 mg/dL   BUN 47 (H) 6 - 20 mg/dL   Creatinine, Ser 6.37 (H) 0.44 - 1.00 mg/dL    Comment: DELTA CHECK NOTED   Calcium 8.5 (L) 8.9 - 10.3 mg/dL   GFR calc non Af Amer 5 (L) >60 mL/min   GFR calc Af Amer 6 (L) >60 mL/min    Comment: (NOTE) The eGFR has been calculated using the CKD EPI equation. This calculation has not been validated in all clinical situations. eGFR's persistently <60 mL/min signify possible Chronic Kidney Disease.    Anion gap 16 (H) 5 - 15    Imaging: Ct Chest W Contrast  06/14/2015  CLINICAL DATA:  Is ABNORMAL CXR, ? RIGHT PERIHILAR MASS, CARDIOMEGALY, IV CONTRAST OK'S, PT. TO HAVE DIALYSIS ON Monday. EXAM: CT CHEST WITH CONTRAST TECHNIQUE:  Multidetector CT imaging of the chest was performed during intravenous contrast administration. CONTRAST:  95m OMNIPAQUE IOHEXOL 300 MG/ML  SOLN COMPARISON:  06/13/2015 FINDINGS: Heart: The heart is enlarged. There is extensive coronary artery calcification. No pericardial effusion. Left atrial enlargement. Vascular structures: Significant atherosclerotic calcification of the thoracic aorta. No aneurysm or evidence for dissection. Pulmonary arteries are grossly well opacified. Pulmonary artery is upper limits normal in size. Significant collateral vessels in the right anterior posterior chest wall. Suspect occlusion of the proximal right subclavian vein. Mediastinum/thyroid: 2.0 x 1.5 cm low-attenuation lesion within the left thyroid lobe. There is a 1.1 cm lymph node in the precarinal region. Lungs/Airways: No suspicious pulmonary nodules are identified. A left lower lobe calcified granuloma is present. No pleural effusions. No focal consolidations. Upper abdomen: There is elevation of the right hemidiaphragm. Calcified gallstones layer in the gallbladder. There is dense  atherosclerotic calcification of the celiac axis and superior mesenteric artery. Small calcified granulomata are identified within the liver. Chest wall/osseous structures: Wedge compression fracture of T11 is chronic. IMPRESSION: 1. Cardiomegaly. 2. Prominent pulmonary artery, likely accounting for the chest x-ray abnormality. No hilar mass identified. 3. Elevated right hemidiaphragm, stable. 4. 2.0 x 1.5 cm left thyroid lobe nodule. Given the benign appearance and patient's comorbidities, no further evaluation is recommended based on consensus criteria. 5. Suspect conclusion of the right brachiocephalic vein. Significant collateral vessels in the right anterior and posterior chest wall. 6. Cholelithiasis. 7. Evidence for previous granulomatous disease. 8. Chronic wedge compression fracture of T11. Electronically Signed   By: Nolon Nations  M.D.   On: 06/14/2015 17:22    Assessment:  Principal Problem:   Symptomatic anemia Active Problems:   Hyperlipidemia   Hypertension   Chronic Facial droop   Anemia in chronic kidney disease   End stage renal disease on dialysis ([place])   Anemia due to blood loss, chronic   GAVE (gastric antral vascular ectasia)   NSTEMI (non-ST elevated myocardial infarction) ([place])   Demand ischemia ([facility])   CAD-severe multivessel, not CABG candidate   Moderate to severe pulmonary hypertension ([facility])   Abnormal chest x-ray   Anemia   Plan:  Resume aspirin 81 mg daily (ok per GI). LVEF stable despite elevated troponin. On BB. No ACE-I/ARB/Entresto due to ESRD on HD. Ok for d/c today from my standpoint. Follow-up with Dr. Stanford Breed as an outpatient.  Time Spent Directly with Patient:  15 minutes  Length of Stay:  LOS: 2 days   Pixie Casino, MD, [facility] Attending Cardiologist [place] 06/15/2015, 1:19 PM

## 2015-06-15 NOTE — Progress Notes (Signed)
EGD with APC for bleeding GAVE lesions in distal stomach. See full note in EPIC.  She is OK for d/c later today if that is OK with cardiology. It is ok to resume her ASA 81mg . She should continue to take daily PPI.  She is already scheduled for EGD on 3/28 with Dr. Silverio Decamp as an outpatient and should keep that appointment to repeat APC treatment of the [place], there may have been some small areas that were not completely destroyed today.

## 2015-06-15 NOTE — Progress Notes (Signed)
Patient able to tolerate her meals.No complaints.

## 2015-06-15 NOTE — Discharge Instructions (Signed)

## 2015-06-15 NOTE — Discharge Summary (Signed)
Physician Discharge Summary  [name] [id number] DOB: 1931/04/25 DOA: 06/13/2015  PCP: [name], MD  Admit date: 06/13/2015 Discharge date: 06/15/2015  Recommendations for Outpatient Follow-up:  1. Pt will need to follow up with PCP in 1-2 weeks post discharge 2. Please obtain BMP to evaluate electrolytes and kidney function 3. Please also check CBC to evaluate Hg and Hct levels   Discharge Diagnoses:  Principal Problem:   Symptomatic anemia Active Problems:   Hyperlipidemia   Hypertension  Discharge Condition: Stable  Diet recommendation: Heart healthy diet discussed in details   History of present illness:  82 year old Caucasian female with the past medical history of chronic right-sided facial droop since childhood, end-stage renal disease on dialysis, known GAVE, chronic anemia due to chronic blood loss, presented with complaints of facial pain with nausea. Had noted dark stool but denied any black stool. No blood in the stool. She was found to be anemic with a hemoglobin of 5.9. She was hospitalized for further management. She was found to have elevated troponin and cardio consulted.   Hospital Course:  Principal Problem:   End stage renal disease on dialysis [facility]) - continue with scheduled HD session     Anemia due to blood loss, chronic / GAVE (gastric antral vascular ectasia) - per Gi, Ok to resume aspirin - also continue PPI - outpatient follow up with GI team    NSTEMI (non-ST elevated myocardial infarction) ([place]) / Demand ischemia  - Large troponin elevation likely attributable to demand ischemia in the setting of severe anemia - pt is asymptomatic - continue aspirin  - cardio cleared for discharge     CAD-severe multivessel, not CABG candidate    Abnormal chest x-ray, right mid and lower opacities  - follow-up chest radiograph in 3 to 4 weeks after treatment is recommended to ensure resolution   Procedures/Studies: Dg Chest 2 View 06/13/2015   Worsening right mid and lower lung heterogeneous airspace opacities - atelectasis versus infiltrate, progressed since the 08/2014 examination though new compared to the 07/24/2013 examination. As a right perihilar mass could result in a similar appearance, a follow-up chest radiograph in 3 to 4 weeks after treatment is recommended to ensure resolution. 2. Similar findings of cardiomegaly without definitive evidence of edema. 3. Unchanged moderate elevation of the right hemidiaphragm.   Ct Head Wo Contrast 06/13/2015   Mild diffuse cortical atrophy. No acute intracranial abnormality seen. No significant abnormality seen in the maxillofacial region.   Ct Chest W Contrast 06/14/2015 Cardiomegaly. 2. Prominent pulmonary artery, likely accounting for the chest x-ray abnormality. No hilar mass identified. 3. Elevated right hemidiaphragm, stable. 4. 2.0 x 1.5 cm left thyroid lobe nodule. Given the benign appearance and patient's comorbidities, no further evaluation is recommended based on consensus criteria. 5. Suspect conclusion of the right brachiocephalic vein. Significant collateral vessels in the right anterior and posterior chest wall. 6. Cholelithiasis. 7. Evidence for previous granulomatous disease.   Ct Maxillofacial Wo Cm 06/13/2015 Mild diffuse cortical atrophy. No acute intracranial abnormality seen. No significant abnormality seen in the maxillofacial region.  Discharge Exam: Filed Vitals:   06/15/15 0910 06/15/15 0939  BP: 126/41 139/62  Pulse: 76 74  Temp:  97.7 F (36.5 C)  Resp: 21 19   Filed Vitals:   06/15/15 0853 06/15/15 0900 06/15/15 0910 06/15/15 0939  BP: 125/38 118/32 126/41 139/62  Pulse: 75 75 76 74  Temp: 98.5 F (36.9 C)   97.7 F (36.5 C)  TempSrc: Oral   Oral  Resp: 24 19 21 19   Height:      Weight:      SpO2: 100% 100% 100% 100%    General: Pt is alert, follows commands appropriately, not in acute distress Cardiovascular: Regular rate and rhythm, no rubs, no  gallops Respiratory: Clear to auscultation bilaterally, no wheezing, no crackles, no rhonchi Abdominal: Soft, non tender, non distended, bowel sounds +, no guarding  Discharge Instructions  Discharge Instructions    Diet - low sodium heart healthy    Complete by:  As directed      Increase activity slowly    Complete by:  As directed             Medication List    TAKE these medications        acetaminophen 500 MG tablet  Commonly known as:  TYLENOL  Take 1,000 mg by mouth every 6 (six) hours as needed for moderate pain.     amLODipine 2.5 MG tablet  Commonly known as:  NORVASC  Take 2.5 mg by mouth at bedtime.     aspirin EC 81 MG tablet  Take 1 tablet (81 mg total) by mouth daily.     atorvastatin 80 MG tablet  Commonly known as:  LIPITOR  Take 1 tablet (80 mg total) by mouth daily.     calcitRIOL 0.25 MCG capsule  Commonly known as:  ROCALTROL  Take 5 capsules (1.25 mcg total) by mouth every Monday, Wednesday, and Friday with hemodialysis.     cholecalciferol 1000 units tablet  Commonly known as:  VITAMIN D  Take 3,000 Units by mouth every Monday, Wednesday, and Friday.     fexofenadine 180 MG tablet  Commonly known as:  ALLEGRA  Take 180 mg by mouth 2 (two) times daily.     metoprolol tartrate 25 MG tablet  Commonly known as:  LOPRESSOR  Take 0.5 tablets (12.5 mg total) by mouth 2 (two) times daily.     pantoprazole 40 MG tablet  Commonly known as:  PROTONIX  Take 1 tablet (40 mg total) by mouth 2 (two) times daily.     PRESCRIPTION MEDICATION  [facility] Anemia Therapy     REFRESH P.M. OP  Place 1 application into the right eye daily.     RENA-VITE PO  Take 1 tablet by mouth daily with breakfast.     TUMS ULTRA 1000 PO  Take 3,000 mg by mouth 3 (three) times daily with meals.           Follow-up Information    Follow up with [name], MD.   Specialty:  [facility]   Contact information:   [place]  [address] [phone]        The results of significant diagnostics from this hospitalization (including imaging, microbiology, ancillary and laboratory) are listed below for reference.     Microbiology: Recent Results (from the past 240 hour(s))  MRSA PCR Screening     Status: None   Collection Time: 06/13/15  6:50 PM  Result Value Ref Range Status   MRSA by PCR NEGATIVE NEGATIVE Final    Comment:        The GeneXpert MRSA Assay (FDA approved for NASAL specimens only), is one component of a comprehensive MRSA colonization surveillance program. It is not intended to diagnose MRSA infection nor to guide or monitor treatment for MRSA infections.      Labs: Basic Metabolic Panel:  Recent Labs Lab 06/13/15 0741 06/13/15 1830 06/14/15  0131 06/15/15 0539  NA 139 137 138 137  K 5.4* 3.3* 3.9 4.1  CL 99* 96* 97* 95*  CO2 25 28 27 26   GLUCOSE 166* 123* 90 94  BUN 72* 19 26* 47*  CREATININE 6.70* 3.11* 3.97* 6.37*  CALCIUM 9.1 8.1* 8.2* 8.5*  PHOS  --  2.8  --   --    Liver Function Tests:  Recent Labs Lab 06/13/15 1830  ALBUMIN 3.1*   No results for input(s): LIPASE, AMYLASE in the last 168 hours. No results for input(s): AMMONIA in the last 168 hours. CBC:  Recent Labs Lab 06/13/15 0741 06/13/15 1649 06/14/15 0131 06/15/15 0539  WBC 6.1 7.9 6.4 6.4  NEUTROABS 4.1  --   --   --   HGB 5.9* 8.6* 9.8* 9.8*  HCT 20.1* 26.8* 30.5* 31.8*  MCV 95.7 88.2 89.4 90.9  PLT 280 251 191 241   Cardiac Enzymes:  Recent Labs Lab 06/13/15 0741 06/13/15 1830 06/14/15 0131  TROPONINI 0.21* 16.31* 21.74*   CBG:  Recent Labs Lab 06/13/15 0509  GLUCAP 194*   SIGNED: Time coordinating discharge: 30 minutes  Faye Ramsay, MD  [facility] 06/15/2015, 1:42 PM Pager [phone]  If 7PM-7AM, please contact night-coverage [email or web address] Password [email or web address]

## 2015-06-16 ENCOUNTER — Telehealth: Payer: Self-pay | Admitting: Cardiology

## 2015-06-16 ENCOUNTER — Telehealth: Payer: Self-pay | Admitting: Family Medicine

## 2015-06-16 NOTE — Telephone Encounter (Signed)
Returned call to patient. She was in the hospital over the weekend and wanted to know if [facility] did her lipid/liver - informed her that these labs were not drawn during her hospitalization. She states she will got to [facility] and have the blood work on Thursday 3/23 - she has an MD appt with Dr. Stanford Breed 3/24.

## 2015-06-16 NOTE — Telephone Encounter (Signed)
Message left for patient to return my call.  

## 2015-06-16 NOTE — Telephone Encounter (Signed)
LMTCB

## 2015-06-16 NOTE — Telephone Encounter (Signed)
Patient asked for Maudie Mercury to call her.  She was in the hospital.

## 2015-06-16 NOTE — Telephone Encounter (Signed)
New message   Pt wants rn to call her about labs

## 2015-06-16 NOTE — Telephone Encounter (Signed)
Patient returned Kim's call. °

## 2015-06-17 ENCOUNTER — Ambulatory Visit: Payer: Medicare Other | Admitting: Cardiology

## 2015-06-17 NOTE — Telephone Encounter (Signed)
Message left for patient to return my call.  

## 2015-06-18 ENCOUNTER — Ambulatory Visit: Payer: Medicare Other | Admitting: Family Medicine

## 2015-06-18 ENCOUNTER — Ambulatory Visit: Payer: Medicare Other | Admitting: Cardiology

## 2015-06-18 DIAGNOSIS — M9905 Segmental and somatic dysfunction of pelvic region: Secondary | ICD-10-CM | POA: Diagnosis not present

## 2015-06-18 DIAGNOSIS — M5136 Other intervertebral disc degeneration, lumbar region: Secondary | ICD-10-CM | POA: Diagnosis not present

## 2015-06-18 DIAGNOSIS — M955 Acquired deformity of pelvis: Secondary | ICD-10-CM | POA: Diagnosis not present

## 2015-06-18 DIAGNOSIS — M9903 Segmental and somatic dysfunction of lumbar region: Secondary | ICD-10-CM | POA: Diagnosis not present

## 2015-06-18 NOTE — Telephone Encounter (Signed)
Patient has follow up scheduled for 06/26/15.

## 2015-06-19 ENCOUNTER — Other Ambulatory Visit
Admission: RE | Admit: 2015-06-19 | Discharge: 2015-06-19 | Disposition: A | Discharge status: Home / Self Care | Payer: Medicare Other | Source: Ambulatory Visit | Attending: Cardiology | Admitting: Cardiology

## 2015-06-19 DIAGNOSIS — I5033 Acute on chronic diastolic (congestive) heart failure: Secondary | ICD-10-CM | POA: Insufficient documentation

## 2015-06-19 DIAGNOSIS — E785 Hyperlipidemia, unspecified: Secondary | ICD-10-CM | POA: Insufficient documentation

## 2015-06-19 DIAGNOSIS — N189 Chronic kidney disease, unspecified: Secondary | ICD-10-CM | POA: Insufficient documentation

## 2015-06-19 DIAGNOSIS — D631 Anemia in chronic kidney disease: Secondary | ICD-10-CM | POA: Diagnosis not present

## 2015-06-19 DIAGNOSIS — Z79899 Other long term (current) drug therapy: Secondary | ICD-10-CM | POA: Insufficient documentation

## 2015-06-19 LAB — HEPATIC FUNCTION PANEL
ALT: 40 U/L (ref 14–54)
AST: 54 U/L — ABNORMAL HIGH (ref 15–41)
Albumin: 3.7 g/dL (ref 3.5–5.0)
Alkaline Phosphatase: 139 U/L — ABNORMAL HIGH (ref 38–126)
BILIRUBIN DIRECT: 0.1 mg/dL (ref 0.1–0.5)
BILIRUBIN INDIRECT: 0.5 mg/dL (ref 0.3–0.9)
TOTAL PROTEIN: 7 g/dL (ref 6.5–8.1)
Total Bilirubin: 0.6 mg/dL (ref 0.3–1.2)

## 2015-06-19 LAB — BASIC METABOLIC PANEL
ANION GAP: 12 (ref 5–15)
BUN: 49 mg/dL — ABNORMAL HIGH (ref 6–20)
CALCIUM: 8.9 mg/dL (ref 8.9–10.3)
CHLORIDE: 98 mmol/L — AB (ref 101–111)
CO2: 29 mmol/L (ref 22–32)
Creatinine, Ser: 5.81 mg/dL — ABNORMAL HIGH (ref 0.44–1.00)
GFR calc non Af Amer: 6 mL/min — ABNORMAL LOW (ref >60)
GFR, EST AFRICAN AMERICAN: 7 mL/min — AB (ref >60)
GLUCOSE: 112 mg/dL — AB (ref 65–99)
POTASSIUM: 3.8 mmol/L (ref 3.5–5.1)
Sodium: 139 mmol/L (ref 135–145)

## 2015-06-19 LAB — PROTIME-INR
INR: 1.04
PROTHROMBIN TIME: 13.8 s (ref 11.4–15.0)

## 2015-06-19 LAB — LIPID PANEL
CHOL/HDL RATIO: 1.9 ratio
Cholesterol: 122 mg/dL (ref 0–200)
HDL: 65 mg/dL (ref >40)
LDL Cholesterol: 42 mg/dL (ref 0–99)
TRIGLYCERIDES: 73 mg/dL (ref <150)
VLDL: 15 mg/dL (ref 0–40)

## 2015-06-19 LAB — CBC
HEMATOCRIT: 33.4 % — AB (ref 35.0–47.0)
HEMOGLOBIN: 10.8 g/dL — AB (ref 12.0–16.0)
MCH: 29.5 pg (ref 26.0–34.0)
MCHC: 32.3 g/dL (ref 32.0–36.0)
MCV: 91.3 fL (ref 80.0–100.0)
PLATELETS: 242 10*3/uL (ref 150–440)
RBC: 3.66 MIL/uL — AB (ref 3.80–5.20)
RDW: 18.4 % — ABNORMAL HIGH (ref 11.5–14.5)
WBC: 5.9 10*3/uL (ref 3.6–11.0)

## 2015-06-20 ENCOUNTER — Ambulatory Visit (INDEPENDENT_AMBULATORY_CARE_PROVIDER_SITE_OTHER): Payer: Medicare Other | Admitting: Cardiology

## 2015-06-20 ENCOUNTER — Encounter: Payer: Self-pay | Admitting: Cardiology

## 2015-06-20 VITALS — BP 118/56 | HR 74 | Ht 59.0 in | Wt 114.5 lb

## 2015-06-20 DIAGNOSIS — I10 Essential (primary) hypertension: Secondary | ICD-10-CM

## 2015-06-20 DIAGNOSIS — I2583 Coronary atherosclerosis due to lipid rich plaque: Principal | ICD-10-CM

## 2015-06-20 DIAGNOSIS — I2510 Atherosclerotic heart disease of native coronary artery without angina pectoris: Secondary | ICD-10-CM | POA: Diagnosis not present

## 2015-06-20 DIAGNOSIS — E785 Hyperlipidemia, unspecified: Secondary | ICD-10-CM | POA: Diagnosis not present

## 2015-06-20 NOTE — Patient Instructions (Signed)
Your physician recommends that you schedule a follow-up appointment in: 3 MONTHS WITH DR CRENSHAW  

## 2015-06-20 NOTE — Assessment & Plan Note (Signed)
Followed by nephrology. She is also due for repeat EGDLater this month.

## 2015-06-20 NOTE — Assessment & Plan Note (Signed)
Patient has significant three-vessel coronary artery disease. She has been seen by Dr. Roxan Hockey and felt not to be a good candidate for coronary artery bypass and graft and she preferred conservative measures with medical therapy. I think this is reasonable. Continue aspirin and statin. I will add low-dose toprol 12.5 mg daily. Note she has significant ischemia when she becomes extremely anemic. She will need close follow-up of her hemoglobin by nephrology and transfusion as needed.

## 2015-06-20 NOTE — Assessment & Plan Note (Signed)
Continue statin. 

## 2015-06-20 NOTE — Assessment & Plan Note (Signed)
Blood pressure controlled. Continue present medications. 

## 2015-06-23 ENCOUNTER — Encounter (HOSPITAL_COMMUNITY): Payer: Self-pay | Admitting: Emergency Medicine

## 2015-06-23 NOTE — Progress Notes (Signed)
Anesthesia Chart Review:  Pt is an 82 year old female scheduled for EGD, hot hemostasis on 06/24/2015 with Dr. Silverio Decamp.   Pt is a same day work up.   Cardiologist is Dr. Kirk Ruths who is aware of upcoming procedure.    PMH includes:  CAD (severe 3 vessel disease, medically managed; pt asymptomatic unless severely anemic) , heart murmur, HTN, hyperlipidemia, ESRD on HD, gastric antral vascular ectasia, anemia, breast cancer, GERD. Hard of hearing. Former smoker. BMI 23.   Pt hospitalized 3/17 - 06/15/15 for symptomatic anemia (hgb 5.9), complicated by elevated troponin thought to be demand ischemia due to anemia.   Medications include: amlodipine, ASA, lipitor, protonix.   Labs 06/19/15 reviewed. Cr/BUN consistent with ESRD.   Chest x-ray 06/13/15 reviewed.  1. Worsening right mid and lower lung heterogeneous airspace opacities - atelectasis versus infiltrate, progressed since the 08/2014 examination though new compared to the 07/24/2013 examination. As a right perihilar mass could result in a similar appearance, a follow-up chest radiograph in 3 to 4 weeks after treatment is recommended to ensure resolution. 2. Similar findings of cardiomegaly without definitive evidence of edema. 3. Unchanged moderate elevation of the right hemidiaphragm.  EKG 06/13/15: Sinus rhythm. Borderline short PR interval. Low voltage, extremity and precordial leads. Repol abnrm, severe global ischemia (LM/MVD). diffuse ST depression. ST elevation aVR. Note EKG was obtained in the setting of severe anemia which is known to be associated with ischemia for her. Will repeat DOS.   Echo 06/14/15:  - Left ventricle: The cavity size was normal. Wall thickness was increased in a pattern of mild LVH. Systolic function was normal. The estimated ejection fraction was in the range of 55% to 60%. Wall motion was normal; there were no regional wall motion abnormalities. - Aortic valve: Trileaflet; moderately calcified leaflets.  Sclerosis without stenosis. There was trivial regurgitation. - Mitral valve: Mildly calcified annulus. Mildly calcified leaflets. There was moderate regurgitation. - Left atrium: The atrium was mildly to moderately dilated. - Right ventricle: The cavity size was normal. Systolic function was normal. - Tricuspid valve: Peak RV-RA gradient (S): 45 mm Hg. - Pulmonary arteries: PA peak pressure: 48 mm Hg (S). - Inferior vena cava: The vessel was normal in size. The respirophasic diameter changes were in the normal range (>= 50%), consistent with normal central venous pressure. - Impressions: Normal LV size with mild LV hypertrophy. EF 55-60%. Normal RV size and systolic function. Moderate mitral regurgitation. Mild pulmonary hypertension.  Cardiac cath 04/10/15:  1. Dist RCA lesion, 95% stenosed. 2. Post Atrio lesion, 95% stenosed. 3. Prox RCA to Mid RCA lesion, 70% stenosed. 4. Acute Mrg lesion, 85% stenosed. 5. Ost Ramus to Ramus lesion, 99% stenosed. 6. Prox Cx to Mid Cx lesion, 80% stenosed. 7. Ost 1st Mrg to 1st Mrg lesion, 85% stenosed. 8. Ost 2nd Diag to 2nd Diag lesion, 75% stenosed. 9. Mid LAD to Dist LAD lesion, 80% stenosed.  Severe three-vessel coronary disease involving the mid LAD, mid to distal RCA, and mid circumflex. Distal vessels are Large enough to graft.  Normal left ventricular end-diastolic pressure.  Noninvasive EF of 50% in Mid to late 2016.  Successful Angio-Seal used for hemostasis. Note: pt saw Dr. Roxan Hockey 05/13/15 for consideration for CABG. He felt she was high risk. Pt decided on medical management.   If EKG acceptable DOS, I anticipate pt can proceed as scheduled.   Jill Cass, FNP-BC [facility]/Anesthesiology Phone: [phone] 06/23/2015 4:37 PM

## 2015-06-24 ENCOUNTER — Ambulatory Visit (HOSPITAL_COMMUNITY): Admission: RE | Admit: 2015-06-24 | Payer: Medicare Other | Source: Ambulatory Visit | Admitting: Gastroenterology

## 2015-06-24 ENCOUNTER — Telehealth: Payer: Self-pay | Admitting: Gastroenterology

## 2015-06-24 ENCOUNTER — Encounter (HOSPITAL_COMMUNITY): Admission: RE | Payer: Self-pay | Source: Ambulatory Visit

## 2015-06-24 ENCOUNTER — Telehealth: Payer: Self-pay | Admitting: Cardiology

## 2015-06-24 SURGERY — ESOPHAGOGASTRODUODENOSCOPY (EGD) WITH PROPOFOL
Anesthesia: Monitor Anesthesia Care

## 2015-06-24 NOTE — Telephone Encounter (Signed)
Mrs. [name] is calling because her gastroenterologist  wants to endoscopy and what does Dr. Stanford Breed thinks, says they just did one ten days ago. Please call

## 2015-06-24 NOTE — Telephone Encounter (Signed)
Pt should follow instructions of gastroenterologist Kirk Ruths

## 2015-06-24 NOTE — Telephone Encounter (Signed)
FORWARD TO DR CRENSHAW 

## 2015-06-24 NOTE — Telephone Encounter (Signed)
Spoke with pt, Aware of dr crenshaw's recommendations.  °

## 2015-06-25 ENCOUNTER — Other Ambulatory Visit: Payer: Self-pay

## 2015-06-25 DIAGNOSIS — K31819 Angiodysplasia of stomach and duodenum without bleeding: Secondary | ICD-10-CM

## 2015-06-25 DIAGNOSIS — D62 Acute posthemorrhagic anemia: Secondary | ICD-10-CM

## 2015-06-25 NOTE — Telephone Encounter (Signed)
Dr. Ardis Hughs would you accept the switch? Thanks

## 2015-06-25 NOTE — Telephone Encounter (Signed)
Ok

## 2015-06-25 NOTE — Telephone Encounter (Signed)
I'm not able to fit her in.  Also, she was supposed to have repeat EGD yesterday (to retreat the [place]) with Dr. Silverio Decamp, not sure why that was cancelled.  She needs it.

## 2015-06-25 NOTE — Telephone Encounter (Signed)
Contacted patient and advised of Dr. Ardis Hughs recommendations - pt states she now wants to stay with Dr. Silverio Decamp.  Advise

## 2015-06-25 NOTE — Telephone Encounter (Signed)
Patient agrees to go for EGD on 06/27/15. Arrangements made.

## 2015-06-25 NOTE — Telephone Encounter (Signed)
LM ON VMAIL TO CB REGARDING DR. Ardis Hughs MESSAGE BELOW

## 2015-06-26 ENCOUNTER — Ambulatory Visit (INDEPENDENT_AMBULATORY_CARE_PROVIDER_SITE_OTHER): Payer: Medicare Other | Admitting: Family Medicine

## 2015-06-26 ENCOUNTER — Encounter: Payer: Self-pay | Admitting: Family Medicine

## 2015-06-26 VITALS — BP 114/70 | HR 74 | Temp 97.4°F | Ht 59.0 in | Wt 114.5 lb

## 2015-06-26 DIAGNOSIS — D631 Anemia in chronic kidney disease: Secondary | ICD-10-CM

## 2015-06-26 DIAGNOSIS — K31819 Angiodysplasia of stomach and duodenum without bleeding: Secondary | ICD-10-CM

## 2015-06-26 DIAGNOSIS — N186 End stage renal disease: Secondary | ICD-10-CM

## 2015-06-26 DIAGNOSIS — I2510 Atherosclerotic heart disease of native coronary artery without angina pectoris: Secondary | ICD-10-CM | POA: Diagnosis not present

## 2015-06-26 DIAGNOSIS — D500 Iron deficiency anemia secondary to blood loss (chronic): Secondary | ICD-10-CM | POA: Diagnosis not present

## 2015-06-26 DIAGNOSIS — N189 Chronic kidney disease, unspecified: Secondary | ICD-10-CM | POA: Diagnosis not present

## 2015-06-26 DIAGNOSIS — Z992 Dependence on renal dialysis: Secondary | ICD-10-CM

## 2015-06-26 DIAGNOSIS — I2583 Coronary atherosclerosis due to lipid rich plaque: Secondary | ICD-10-CM

## 2015-06-26 NOTE — Progress Notes (Signed)
Pre visit review using our clinic review tool, if applicable. No additional management support is needed unless otherwise documented below in the visit note. 

## 2015-06-26 NOTE — Assessment & Plan Note (Signed)
Maintaining Hgb levels. Discussed transfuse if <7

## 2015-06-26 NOTE — Progress Notes (Signed)
BP 114/70 mmHg  Pulse 74  Temp(Src) 97.4 F (36.3 C) (Oral)  Ht 4\' 11"  (1.499 m)  Wt 114 lb 8 oz (51.937 kg)  BMI 23.11 kg/m2  SpO2 92%   CC: hosp f/u visit  Subjective:    Patient ID: [name], female    DOB: [birth year], 82 y.o.   MRN: [id number]  HPI: [name] is a 82 y.o. female presenting on 06/26/2015 for Hospitalization Follow-up   Recent hospitalization for jaw pain anginal equivalent, found to be anemic with Hgb 5.9, transfused 2u pRBC. Found to have NSTEMI from demand ischemia from severe anemia. EGD in hosptial with APC for GAVE lesions in distal stomach. Continues protonix 40mg  bid. Endorses Hgb 11 at dialysis yesterday 06/25/2015. Pending EGD tomorrow for GAVE leading to anemia which is inducing ischemic anginal symptoms.   Pt feeling well today.   Admit date: 06/13/2015 Discharge date: 06/15/2015 No f/u phone call performed.  Recommendations for Outpatient Follow-up:  1. Pt will need to follow up with PCP in 1-2 weeks post discharge 2. Please obtain BMP to evaluate electrolytes and kidney function 3. Please also check CBC to evaluate Hg and Hct levels  Discharge Diagnoses:  Principal Problem:  Symptomatic anemia Active Problems:  Hyperlipidemia  Hypertension  Discharge Condition: Stable Diet recommendation: Heart healthy diet discussed in details   Relevant past medical, surgical, family and social history reviewed and updated as indicated. Interim medical history since our last visit reviewed. Allergies and medications reviewed and updated. Current Outpatient Prescriptions on File Prior to Visit  Medication Sig  . acetaminophen (TYLENOL) 500 MG tablet Take 1,000 mg by mouth every 6 (six) hours as needed for moderate pain.  Marland Kitchen amLODipine (NORVASC) 2.5 MG tablet Take 2.5 mg by mouth at bedtime.  . Artificial Tear Ointment (REFRESH P.M. OP) Place 1 application into the right eye daily.  Marland Kitchen aspirin EC 81 MG tablet Take 1 tablet (81 mg total) by  mouth daily.  Marland Kitchen atorvastatin (LIPITOR) 80 MG tablet Take 1 tablet (80 mg total) by mouth daily.  . B Complex-C-Folic Acid (RENA-VITE PO) Take 1 tablet by mouth daily with breakfast.   . Calcium Carbonate Antacid (TUMS ULTRA 1000 PO) Take 3,000 mg by mouth 3 (three) times daily with meals.   . cholecalciferol (VITAMIN D) 1000 UNITS tablet Take 3,000 Units by mouth every Monday, Wednesday, and Friday.   . fexofenadine (ALLEGRA) 180 MG tablet Take 180 mg by mouth 2 (two) times daily.  . metoprolol tartrate (LOPRESSOR) 25 MG tablet Take 0.5 tablets (12.5 mg total) by mouth 2 (two) times daily.  . pantoprazole (PROTONIX) 40 MG tablet Take 1 tablet (40 mg total) by mouth 2 (two) times daily. (Patient taking differently: Take 80 mg by mouth daily. )  . PRESCRIPTION MEDICATION [facility] Anemia Therapy   No current facility-administered medications on file prior to visit.    Review of Systems Per HPI unless specifically indicated in ROS section     Objective:    BP 114/70 mmHg  Pulse 74  Temp(Src) 97.4 F (36.3 C) (Oral)  Ht 4\' 11"  (1.499 m)  Wt 114 lb 8 oz (51.937 kg)  BMI 23.11 kg/m2  SpO2 92%  Wt Readings from Last 3 Encounters:  06/26/15 114 lb 8 oz (51.937 kg)  06/20/15 114 lb 8 oz (51.937 kg)  06/14/15 117 lb 1 oz (53.1 kg)    Physical Exam  Constitutional: She appears well-developed and well-nourished. No distress.  HENT:  Mouth/Throat: Oropharynx  is clear and moist. No oropharyngeal exudate.  Chronic facial droop  Cardiovascular: Normal rate, regular rhythm and intact distal pulses.   Murmur (3/6 SEM) heard. Pulmonary/Chest: Effort normal and breath sounds normal. No respiratory distress. She has no wheezes. She has no rales.  Musculoskeletal: She exhibits no edema.  Nursing note and vitals reviewed.  Results for orders placed or performed during the hospital encounter of 06/19/15  Hepatic function panel  Result Value Ref Range   Total Protein 7.0 6.5 - 8.1 g/dL   Albumin 3.7  3.5 - 5.0 g/dL   AST 54 (H) 15 - 41 U/L   ALT 40 14 - 54 U/L   Alkaline Phosphatase 139 (H) 38 - 126 U/L   Total Bilirubin 0.6 0.3 - 1.2 mg/dL   Bilirubin, Direct 0.1 0.1 - 0.5 mg/dL   Indirect Bilirubin 0.5 0.3 - 0.9 mg/dL  Lipid panel  Result Value Ref Range   Cholesterol 122 0 - 200 mg/dL   Triglycerides 73 <150 mg/dL   HDL 65 >40 mg/dL   Total CHOL/HDL Ratio 1.9 RATIO   VLDL 15 0 - 40 mg/dL   LDL Cholesterol 42 0 - 99 mg/dL  INR/PT  Result Value Ref Range   Prothrombin Time 13.8 11.4 - 15.0 seconds   INR [id number]   Basic Metabolic Panel (BMET)  Result Value Ref Range   Sodium 139 135 - 145 mmol/L   Potassium 3.8 3.5 - 5.1 mmol/L   Chloride 98 (L) 101 - 111 mmol/L   CO2 29 22 - 32 mmol/L   Glucose, Bld 112 (H) 65 - 99 mg/dL   BUN 49 (H) 6 - 20 mg/dL   Creatinine, Ser 5.81 (H) 0.44 - 1.00 mg/dL   Calcium 8.9 8.9 - 10.3 mg/dL   GFR calc non Af Amer 6 (L) >60 mL/min   GFR calc Af Amer 7 (L) >60 mL/min   Anion gap 12 5 - 15  CBC  Result Value Ref Range   WBC 5.9 3.6 - 11.0 K/uL   RBC 3.66 (L) 3.80 - 5.20 MIL/uL   Hemoglobin 10.8 (L) 12.0 - 16.0 g/dL   HCT 33.4 (L) 35.0 - 47.0 %   MCV 91.3 80.0 - 100.0 fL   MCH 29.5 26.0 - 34.0 pg   MCHC 32.3 32.0 - 36.0 g/dL   RDW 18.4 (H) 11.5 - 14.5 %   Platelets 242 150 - 440 K/uL      Assessment & Plan:   Problem List Items Addressed This Visit    Anemia in chronic kidney disease    Maintaining Hgb levels. Pending EGD for tomorrow. Appreciate GI care of patient.       End stage renal disease on dialysis ([place])   Anemia due to blood loss, chronic - Primary    Maintaining Hgb levels. Discussed transfuse if <7      GAVE (gastric antral vascular ectasia)    Pending EGD for tomorrow. Appreciate GI care of patient.  Continue PPI BID for now.      CAD-severe multivessel, not CABG candidate    Appreciate cards care of patient. Reviewed importance of maintaining Hgb >7 ideally higher to avoid recurrent angina. Has seen [facility] - not  surgical candidate for CAD.          Follow up plan: No Follow-up on file.

## 2015-06-26 NOTE — Assessment & Plan Note (Signed)
Pending EGD for tomorrow. Appreciate GI care of patient.  Continue PPI BID for now.

## 2015-06-26 NOTE — Assessment & Plan Note (Signed)
Appreciate cards care of patient. Reviewed importance of maintaining Hgb >7 ideally higher to avoid recurrent angina. Has seen [facility] - not surgical candidate for CAD.

## 2015-06-26 NOTE — Assessment & Plan Note (Signed)
Maintaining Hgb levels. Pending EGD for tomorrow. Appreciate GI care of patient.

## 2015-06-26 NOTE — Patient Instructions (Addendum)
Continue close monitoring of hemoglobin - if <7 will need transfusion, but let us know if dropping <8 at dialysis.  Keep endoscopy appointment for tomorrow.  Continue protonix 40mg  twice daily unless GI says otherwise. Have dialysis send [place] copy of all immunizations received there to update your chart. I only have Hep B shot from 04/23/2015

## 2015-06-27 ENCOUNTER — Encounter (HOSPITAL_COMMUNITY)
Admission: RE | Disposition: A | Discharge status: Home / Self Care | Payer: Self-pay | Source: Ambulatory Visit | Attending: Gastroenterology

## 2015-06-27 ENCOUNTER — Ambulatory Visit (HOSPITAL_COMMUNITY)
Admission: RE | Admit: 2015-06-27 | Discharge: 2015-06-27 | Disposition: A | Discharge status: Home / Self Care | Payer: Medicare Other | Source: Ambulatory Visit | Attending: Gastroenterology | Admitting: Gastroenterology

## 2015-06-27 ENCOUNTER — Ambulatory Visit (HOSPITAL_COMMUNITY): Payer: Medicare Other | Admitting: Anesthesiology

## 2015-06-27 ENCOUNTER — Encounter (HOSPITAL_COMMUNITY): Payer: Self-pay | Admitting: Gastroenterology

## 2015-06-27 DIAGNOSIS — M479 Spondylosis, unspecified: Secondary | ICD-10-CM | POA: Insufficient documentation

## 2015-06-27 DIAGNOSIS — I509 Heart failure, unspecified: Secondary | ICD-10-CM | POA: Insufficient documentation

## 2015-06-27 DIAGNOSIS — Z79899 Other long term (current) drug therapy: Secondary | ICD-10-CM | POA: Insufficient documentation

## 2015-06-27 DIAGNOSIS — D500 Iron deficiency anemia secondary to blood loss (chronic): Secondary | ICD-10-CM | POA: Diagnosis not present

## 2015-06-27 DIAGNOSIS — H9193 Unspecified hearing loss, bilateral: Secondary | ICD-10-CM | POA: Insufficient documentation

## 2015-06-27 DIAGNOSIS — Z853 Personal history of malignant neoplasm of breast: Secondary | ICD-10-CM | POA: Diagnosis not present

## 2015-06-27 DIAGNOSIS — D509 Iron deficiency anemia, unspecified: Secondary | ICD-10-CM | POA: Diagnosis not present

## 2015-06-27 DIAGNOSIS — N186 End stage renal disease: Secondary | ICD-10-CM | POA: Insufficient documentation

## 2015-06-27 DIAGNOSIS — K31819 Angiodysplasia of stomach and duodenum without bleeding: Secondary | ICD-10-CM | POA: Diagnosis not present

## 2015-06-27 DIAGNOSIS — K219 Gastro-esophageal reflux disease without esophagitis: Secondary | ICD-10-CM | POA: Diagnosis not present

## 2015-06-27 DIAGNOSIS — Z87891 Personal history of nicotine dependence: Secondary | ICD-10-CM | POA: Diagnosis not present

## 2015-06-27 DIAGNOSIS — I252 Old myocardial infarction: Secondary | ICD-10-CM | POA: Diagnosis not present

## 2015-06-27 DIAGNOSIS — Z992 Dependence on renal dialysis: Secondary | ICD-10-CM | POA: Diagnosis not present

## 2015-06-27 DIAGNOSIS — K449 Diaphragmatic hernia without obstruction or gangrene: Secondary | ICD-10-CM | POA: Insufficient documentation

## 2015-06-27 DIAGNOSIS — E785 Hyperlipidemia, unspecified: Secondary | ICD-10-CM | POA: Insufficient documentation

## 2015-06-27 DIAGNOSIS — Z800 Family history of malignant neoplasm of digestive organs: Secondary | ICD-10-CM | POA: Diagnosis not present

## 2015-06-27 DIAGNOSIS — K31811 Angiodysplasia of stomach and duodenum with bleeding: Secondary | ICD-10-CM | POA: Insufficient documentation

## 2015-06-27 DIAGNOSIS — Z8249 Family history of ischemic heart disease and other diseases of the circulatory system: Secondary | ICD-10-CM | POA: Diagnosis not present

## 2015-06-27 DIAGNOSIS — D62 Acute posthemorrhagic anemia: Secondary | ICD-10-CM

## 2015-06-27 DIAGNOSIS — Z7982 Long term (current) use of aspirin: Secondary | ICD-10-CM | POA: Diagnosis not present

## 2015-06-27 DIAGNOSIS — I130 Hypertensive heart and chronic kidney disease with heart failure and stage 1 through stage 4 chronic kidney disease, or unspecified chronic kidney disease: Secondary | ICD-10-CM | POA: Insufficient documentation

## 2015-06-27 DIAGNOSIS — R195 Other fecal abnormalities: Secondary | ICD-10-CM

## 2015-06-27 DIAGNOSIS — E1129 Type 2 diabetes mellitus with other diabetic kidney complication: Secondary | ICD-10-CM | POA: Diagnosis not present

## 2015-06-27 DIAGNOSIS — D649 Anemia, unspecified: Secondary | ICD-10-CM | POA: Diagnosis not present

## 2015-06-27 DIAGNOSIS — I120 Hypertensive chronic kidney disease with stage 5 chronic kidney disease or end stage renal disease: Secondary | ICD-10-CM | POA: Diagnosis not present

## 2015-06-27 DIAGNOSIS — Q2733 Arteriovenous malformation of digestive system vessel: Secondary | ICD-10-CM

## 2015-06-27 HISTORY — PX: ESOPHAGOGASTRODUODENOSCOPY: SHX5428

## 2015-06-27 HISTORY — PX: HOT HEMOSTASIS: SHX5433

## 2015-06-27 LAB — POCT I-STAT 4, (NA,K, GLUC, HGB,HCT)
GLUCOSE: 86 mg/dL (ref 65–99)
HCT: 37 % (ref 36.0–46.0)
Hemoglobin: 12.6 g/dL (ref 12.0–15.0)
Potassium: 3.5 mmol/L (ref 3.5–5.1)
Sodium: 142 mmol/L (ref 135–145)

## 2015-06-27 SURGERY — EGD (ESOPHAGOGASTRODUODENOSCOPY)
Anesthesia: Monitor Anesthesia Care

## 2015-06-27 MED ORDER — SODIUM CHLORIDE 0.9 % IV SOLN
INTRAVENOUS | Status: DC
  Administered 2015-06-27: 500 mL via INTRAVENOUS

## 2015-06-27 MED ORDER — ONDANSETRON HCL 4 MG/2ML IJ SOLN
INTRAMUSCULAR | Status: DC | PRN
  Administered 2015-06-27: 4 mg via INTRAVENOUS

## 2015-06-27 MED ORDER — SODIUM CHLORIDE 0.9 % IV SOLN
INTRAVENOUS | Status: DC | PRN
  Administered 2015-06-27: 12:00:00 via INTRAVENOUS

## 2015-06-27 MED ORDER — PHENYLEPHRINE 40 MCG/ML (10ML) SYRINGE FOR IV PUSH (FOR BLOOD PRESSURE SUPPORT)
PREFILLED_SYRINGE | INTRAVENOUS | Status: AC
  Filled 2015-06-27: qty 20

## 2015-06-27 MED ORDER — PHENYLEPHRINE HCL 10 MG/ML IJ SOLN
INTRAMUSCULAR | Status: DC | PRN
  Administered 2015-06-27 (×2): 80 ug via INTRAVENOUS

## 2015-06-27 MED ORDER — PROPOFOL 500 MG/50ML IV EMUL
INTRAVENOUS | Status: DC | PRN
  Administered 2015-06-27: 20 mg via INTRAVENOUS

## 2015-06-27 MED ORDER — PROPOFOL 500 MG/50ML IV EMUL
INTRAVENOUS | Status: DC | PRN
  Administered 2015-06-27: 75 ug/kg/min via INTRAVENOUS

## 2015-06-27 MED ORDER — SODIUM CHLORIDE 0.9 % IV SOLN: INTRAVENOUS | Status: DC

## 2015-06-27 NOTE — Anesthesia Postprocedure Evaluation (Signed)
Anesthesia Post Note  Patient: [name]  Procedure(s) Performed: Procedure(s) (LRB): ESOPHAGOGASTRODUODENOSCOPY (EGD) (N/A) HOT HEMOSTASIS (ARGON PLASMA COAGULATION/BICAP) (N/A)  Patient location during evaluation: PACU Anesthesia Type: MAC Level of consciousness: awake and alert Pain management: pain level controlled Vital Signs Assessment: post-procedure vital signs reviewed and stable Respiratory status: spontaneous breathing, nonlabored ventilation, respiratory function stable and patient connected to nasal cannula oxygen Cardiovascular status: stable and blood pressure returned to baseline Anesthetic complications: no    Last Vitals:  Filed Vitals:   06/27/15 1252 06/27/15 1350  BP: 150/44 120/34  Pulse: 60 63  Temp: 36.8 C   Resp: 18 19    Last Pain: There were no vitals filed for this visit.               Tiajuana Amass

## 2015-06-27 NOTE — Discharge Instructions (Signed)

## 2015-06-27 NOTE — H&P (Signed)
[facility] History and Physical   Primary Care Physician:  [name], MD   Reason for Procedure:   GAVE, iron deficiency anemia Plan:    EGD with APC     HPI: [name] is a 82 y.o. female with h/o ESRD on dialysis, chronic iron def anemia likely in the setting of GAVE with chronic GI blood loss.  The risks and benefits as well as alternatives of endoscopic procedure(s) have been discussed and reviewed. All questions answered. The patient agrees to proceed.    Past Medical History  Diagnosis Date  . Hyperlipidemia   . Hypertension   . Anxiety   . Facial droop 1935    acquired during forceps delivery, some residual R visual loss  . Hx of breast cancer 2004  . Arthritis     mild in lower back (Drs Posey Pronto and Joelyn Oms)  . GERD (gastroesophageal reflux disease)   . Bradycardia 2014    a. Noted 06/2012.  Marland Kitchen Hypotension     a. Associated w/ dialysis.  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 [name])  . Osteoporosis 11/2013    DEXA -3.4 radius  . GAVE (gastric antral vascular ectasia) 08/2014    s/p EGD with APC  . Anemia in chronic kidney disease     IV iron infusion and anemia from GAVE blood loss.  . End stage renal disease on dialysis [facility])     HD M,W,F [place] AVF now LUE AVF  . Breast cancer ([place])     Left Breast 12 yrs. ago  . Hearing loss     Bilateral hearing aids  . Sight impaired     Left: wears contact. Right: diminished peripheral vision  . Cardiac murmur     a. thought due to AVF (2D echo 06/2012 without significant valvular disease).   Marland Kitchen CAD (coronary artery disease), native coronary artery     3 vessel CAD by cath 04/2015    Past Surgical History  Procedure Laterality Date  . Cataract extraction  1981  . Av fistula placement  07/20/10    Right brachiocephalic AVF  . Umbilical hernia repair  2011  . Dexa  2013    [facility]  . US echocardiography  06/2012    LVH, EF 65%, grade 1 diastol  dysfunction, mod dliated LAD  . Eye surgery  1966    regular cataract  . Breast lumpectomy Left   . Breast biopsy Left   . Bascilic vein transposition Left 07/24/2013    Procedure: BASILIC VEIN TRANSPOSITION;  Surgeon: [name], MD;  Location: [facility];  Service: Vascular;  Laterality: Left;  . Shuntogram N/A 04/16/2011    Procedure: Earney Mallet;  Surgeon: [name], MD;  Location[facility] C[facility] LAB;  Service: Cardiovascular;  Laterality: N/A;  . Shuntogram Right 07/05/2013    Procedure: FISTULOGRAM;  Surgeon: [name], MD;  Location[facility] CATH LAB;  Service: Cardiovascular;  Laterality: Right;  . Esophagogastroduodenoscopy N/A 04/09/2014    Procedure: ESOPHAGOGASTRODUODENOSCOPY (EGD);  Surgeon: [name], MD;  Location: [facility];  Service: Endoscopy;  Laterality: N/A;  . Peripheral vascular catheterization N/A 08/15/2014    Procedure: A/V Shuntogram/Fistulagram;  Surgeon: [name], MD;  Location: [place] CV LAB;  Service: Cardiovascular;  Laterality: N/A;  . Esophagogastroduodenoscopy N/A 09/23/2014    Procedure: ESOPHAGOGASTRODUODENOSCOPY (EGD);  Surgeon: [name], MD;  Location: [facility];  Service: Endoscopy;  Laterality: N/A;  .  Hernia repair    . Esophagogastroduodenoscopy (egd) with propofol N/A 11/12/2014    Procedure: ESOPHAGOGASTRODUODENOSCOPY (EGD) WITH PROPOFOL;  Surgeon: [name], MD;  Location: [facility] ENDOSCOPY;  Service: Endoscopy;  Laterality: N/A;  . Hot hemostasis N/A 11/12/2014    Procedure: HOT HEMOSTASIS (ARGON PLASMA COAGULATION/BICAP);  Surgeon: [name], MD;  Location: [facility];  Service: Endoscopy;  Laterality: N/A;  . Esophagogastroduodenoscopy N/A 12/26/2014    neg H pylori, benign biopsies; ESOPHAGOGASTRODUODENOSCOPY (EGD);  Surgeon: [name], MD  . Cardiac catheterization N/A 04/10/2015    Procedure: Left Heart Cath and Coronary Angiography;  Surgeon: [name], MD;  Location: [place] CV LAB;  Service:  Cardiovascular;  Laterality: N/A;  . Peripheral vascular catheterization N/A 04/28/2015    Procedure: A/V Shuntogram/Fistulagram;  Surgeon: [name], MD;  Location: [place] CV LAB;  Service: Cardiovascular;  Laterality: N/A;  . Peripheral vascular catheterization Left 04/28/2015    Procedure: A/V Shunt Intervention;  Surgeon: [name], MD;  Location: [place] CV LAB;  Service: Cardiovascular;  Laterality: Left;  . Esophagogastroduodenoscopy N/A 06/15/2015    Procedure: ESOPHAGOGASTRODUODENOSCOPY (EGD);  Surgeon: Milus Banister, MD;  Location: [place];  Service: Endoscopy;  Laterality: N/A;    Prior to Admission medications   Medication Sig Start Date End Date Taking? Authorizing Provider  acetaminophen (TYLENOL) 500 MG tablet Take 1,000 mg by mouth every 6 (six) hours as needed for moderate pain.   Yes Historical Provider, MD  amLODipine (NORVASC) 2.5 MG tablet Take 2.5 mg by mouth at bedtime.   Yes Historical Provider, MD  Artificial Tear Ointment (REFRESH P.M. OP) Place 1 application into the right eye daily.   Yes Historical Provider, MD  aspirin EC 81 MG tablet Take 1 tablet (81 mg total) by mouth daily. 03/06/15  Yes Lelon Perla, MD  atorvastatin (LIPITOR) 80 MG tablet Take 1 tablet (80 mg total) by mouth daily. 05/06/15  Yes Lelon Perla, MD  B Complex-C-Folic Acid (RENA-VITE PO) Take 1 tablet by mouth daily with breakfast.    Yes Historical Provider, MD  Calcium Carbonate Antacid (TUMS ULTRA 1000 PO) Take 3,000 mg by mouth 3 (three) times daily with meals.    Yes Historical Provider, MD  fexofenadine (ALLEGRA) 180 MG tablet Take 180 mg by mouth 2 (two) times daily.   Yes Historical Provider, MD  metoprolol tartrate (LOPRESSOR) 25 MG tablet Take 0.5 tablets (12.5 mg total) by mouth 2 (two) times daily. 06/15/15  Yes Theodis Blaze, MD  pantoprazole (PROTONIX) 40 MG tablet Take 1 tablet (40 mg total) by mouth 2 (two) times daily. Patient taking differently: Take 80  mg by mouth daily.  05/08/15  Yes Mauri Pole, MD  [place] Anemia Therapy   Yes Historical Provider, MD  cholecalciferol (VITAMIN D) 1000 UNITS tablet Take 3,000 Units by mouth every Monday, Wednesday, and Friday.     Historical Provider, MD    No current facility-administered medications for this encounter.   Facility-Administered Medications Ordered in Other Encounters  Medication Dose Route Frequency Provider Last Rate Last Dose  . 0.9 %  sodium chloride infusion    Continuous PRN Kristopher Key, CRNA        Allergies as of 06/25/2015 - Review Complete 06/20/2015  Allergen Reaction Noted  . Sulfa antibiotics Rash 12/14/2010    Family History  Problem Relation Age of Onset  . Heart disease Brother     Congenital  . Hypertension  Brother   . Diabetes Brother   . Cancer Father     stomach  . CAD Father     MI at age 32  . Heart disease Father   . Heart attack Father   . Cancer Sister     female (uterus?)  . Hypertension Mother     Social History   Social History  . Marital Status: Single    Spouse Name: N/A  . Number of Children: N/A  . Years of Education: N/A   Occupational History  . 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 Smoking status: Former Smoker -- 2 years    Types: Cigarettes    Quit date: 03/29/1953  . Smokeless tobacco: Never Used  . Alcohol Use: 0.0 oz/week    0 Standard drinks or equivalent per week     Comment: occasional  . Drug Use: No  . Sexual Activity: Not on file   Other Topics Concern  . Not on file   Social History Narrative   As of 03/2013. Lives alone with her small dog.  Bother and sister in law died last year.     Occupation: retired, was Information systems manager for [employer]   Edu: some college    Review of Systems:  All other review of systems negative except as mentioned in the HPI.  Physical Exam: Vital signs in last 24  hours: Temp:  [97.4 F (36.3 C)] 97.4 F (36.3 C) (03/30 1719) Pulse Rate:  [74] 74 (03/30 1719) BP: (114)/(70) 114/70 mmHg (03/30 1719) SpO2:  [92 %] 92 % (03/30 1719) Weight:  [114 lb 8 oz (51.937 kg)] 114 lb 8 oz (51.937 kg) (03/30 1719)   General:   Alert,  Well-developed, well-nourished, pleasant and cooperative in NAD Lungs:  Clear throughout to auscultation.   Heart:  Regular rate and rhythm; no murmurs, clicks, rubs,  or gallops. Abdomen:  Soft, nontender and nondistended. Normal bowel sounds.   Neuro/Psych:  Alert and cooperative. Normal mood and affect. A and O x 3   @K .Denzil Magnuson, MD [facility] [phone] (pager) 06/27/2015 12:41 PM@

## 2015-06-27 NOTE — Transfer of Care (Signed)
Immediate Anesthesia Transfer of Care Note  Patient: [name]  Procedure(s) Performed: Procedure(s): ESOPHAGOGASTRODUODENOSCOPY (EGD) (N/A) HOT HEMOSTASIS (ARGON PLASMA COAGULATION/BICAP) (N/A)  Patient Location: PACU  Anesthesia Type:MAC  Level of Consciousness:  sedated, patient cooperative and responds to stimulation  Airway & Oxygen Therapy:Patient Spontanous Breathing and Patient connected to face mask oxgen  Post-op Assessment:  Report given to PACU RN and Post -op Vital signs reviewed and stable  Post vital signs:  Reviewed and stable  Last Vitals:  Filed Vitals:   06/27/15 1252 06/27/15 1350  BP: 150/44 120/34  Pulse: 60 63  Temp: 36.8 C   Resp: 18 19    Complications: No apparent anesthesia complications

## 2015-06-27 NOTE — Op Note (Signed)
[facility] Patient Name: [name] Procedure Date: 06/27/2015 MRN: [id number] Attending MD: Mauri Pole , MD Date of Birth: 1932-03-10 CSN:  Age: 82 Admit Type: Outpatient Procedure:                Upper GI endoscopy Indications:              Arteriovenous malformation in the stomach, Heme                            positive stool, GAVE Providers:                Mauri Pole, MD, Cleda Daub, RN, Elspeth Cho, Technician Referring MD:              Medicines:                Monitored Anesthesia Care Complications:            No immediate complications. Estimated Blood Loss:     Estimated blood loss was minimal. Procedure:                Pre-Anesthesia Assessment:                           - Prior to the procedure, a History and Physical                            was performed, and patient medications and                            allergies were reviewed. The patient's tolerance of                            previous anesthesia was also reviewed. The risks                            and benefits of the procedure and the sedation                            options and risks were discussed with the patient.                            All questions were answered, and informed consent                            was obtained. Prior Anticoagulants: The patient has                            taken no previous anticoagulant or antiplatelet                            agents. ASA Grade Assessment: IV - A patient with  severe systemic disease that is a constant threat                            to life. After reviewing the risks and benefits,                            the patient was deemed in satisfactory condition to                            undergo the procedure.                           After obtaining informed consent, the endoscope was                            passed under direct vision.  Throughout the                            procedure, the patient's blood pressure, pulse, and                            oxygen saturations were monitored continuously. The                            [id number]) scope was introduced through the                            mouth, and advanced to the second part of duodenum.                            The upper GI endoscopy was accomplished without                            difficulty. The patient tolerated the procedure                            well. Scope In: Scope Out: Findings:      The esophagus was normal.      A small hiatal hernia was present. Gastric antral vascular angio ectasia       with active oozing of blood noted in the pre pyloric area. Fulguration       to ablate the lesion by argon plasma was successful. Estimated blood       loss was minimal.      The duodenal bulb and second portion of the duodenum were normal. Impression:               - Normal esophagus.                           - Small hiatal hernia. Treated with argon plasma                            coagulation (APC).                           -  Normal duodenal bulb and second portion of the                            duodenum.                           - No specimens collected. Moderate Sedation:      N/A Recommendation:           - Resume previous diet.                           - Continue present medications.                           - Repeat upper endoscopy in 4 weeks for retreatment.                           - Return to GI clinic in 2 months.                           - Patient has a contact number available for                            emergencies. The signs and symptoms of potential                            delayed complications were discussed with the                            patient. Return to normal activities tomorrow.                            Written discharge instructions were provided to the                             patient. Procedure Code(s):        --- Professional ---                           [phone], Esophagogastroduodenoscopy, flexible,                            transoral; with ablation of tumor(s), polyp(s), or                            other lesion(s) (includes pre- and post-dilation                            and guide wire passage, when performed) Diagnosis Code(s):        --- Professional ---                           K44.9, Diaphragmatic hernia without obstruction or                            gangrene  Q27.33, Arteriovenous malformation of digestive                            system vessel                           R19.5, Other fecal abnormalities CPT copyright 2016 American Medical Association. All rights reserved. The codes documented in this report are preliminary and upon coder review may  be revised to meet current compliance requirements. Mauri Pole, MD 06/27/2015 2:04:17 PM This report has been signed electronically. Number of Addenda: 0

## 2015-06-27 NOTE — Anesthesia Preprocedure Evaluation (Addendum)
Anesthesia Evaluation  Patient identified by MRN, date of birth, ID band Patient awake    Reviewed: Allergy & Precautions, NPO status , Patient's Chart, lab work & pertinent test results  Airway Mallampati: III  TM Distance: >3 FB Neck ROM: Full    Dental  (+) Edentulous Upper, Edentulous Lower   Pulmonary former smoker,    breath sounds clear to auscultation- rhonchi       Cardiovascular hypertension, Pt. on medications + CAD (Severe 3vessel CAD- high risk CABG candidate- medical management. Normal EF), + Past MI and +CHF   Rhythm:Regular Rate:Normal     Neuro/Psych Anxiety negative neurological ROS     GI/Hepatic Neg liver ROS, GERD  ,  Endo/Other  negative endocrine ROS  Renal/GU ESRF and DialysisRenal disease     Musculoskeletal  (+) Arthritis ,   Abdominal   Peds  Hematology  (+) anemia ,   Anesthesia Other Findings   Reproductive/Obstetrics                           Lab Results  Component Value Date   CREATININE 5.81* 06/19/2015   BUN 49* 06/19/2015   NA 139 06/19/2015   K 3.8 06/19/2015   CL 98* 06/19/2015   CO2 29 06/19/2015   Lab Results  Component Value Date   WBC 5.9 06/19/2015   HGB 10.8* 06/19/2015   HCT 33.4* 06/19/2015   MCV 91.3 06/19/2015   PLT 242 06/19/2015    Anesthesia Physical Anesthesia Plan  ASA: IV  Anesthesia Plan: MAC   Post-op Pain Management:    Induction: Intravenous  Airway Management Planned: Natural Airway and Nasal Cannula  Additional Equipment:   Intra-op Plan:   Post-operative Plan:   Informed Consent: I have reviewed the patients History and Physical, chart, labs and discussed the procedure including the risks, benefits and alternatives for the proposed anesthesia with the patient or authorized representative who has indicated his/her understanding and acceptance.     Plan Discussed with: CRNA  Anesthesia Plan Comments:          Anesthesia Quick Evaluation

## 2015-06-29 ENCOUNTER — Encounter (HOSPITAL_COMMUNITY): Payer: Self-pay | Admitting: Gastroenterology

## 2015-06-30 DIAGNOSIS — N186 End stage renal disease: Secondary | ICD-10-CM | POA: Diagnosis not present

## 2015-06-30 DIAGNOSIS — D508 Other iron deficiency anemias: Secondary | ICD-10-CM | POA: Diagnosis not present

## 2015-06-30 DIAGNOSIS — N2581 Secondary hyperparathyroidism of renal origin: Secondary | ICD-10-CM | POA: Diagnosis not present

## 2015-06-30 DIAGNOSIS — D631 Anemia in chronic kidney disease: Secondary | ICD-10-CM | POA: Diagnosis not present

## 2015-07-02 ENCOUNTER — Other Ambulatory Visit: Payer: Self-pay

## 2015-07-02 DIAGNOSIS — N2581 Secondary hyperparathyroidism of renal origin: Secondary | ICD-10-CM | POA: Diagnosis not present

## 2015-07-02 DIAGNOSIS — K5520 Angiodysplasia of colon without hemorrhage: Secondary | ICD-10-CM

## 2015-07-02 DIAGNOSIS — K31819 Angiodysplasia of stomach and duodenum without bleeding: Secondary | ICD-10-CM

## 2015-07-02 DIAGNOSIS — N186 End stage renal disease: Secondary | ICD-10-CM | POA: Diagnosis not present

## 2015-07-02 DIAGNOSIS — D508 Other iron deficiency anemias: Secondary | ICD-10-CM | POA: Diagnosis not present

## 2015-07-02 DIAGNOSIS — R195 Other fecal abnormalities: Secondary | ICD-10-CM

## 2015-07-02 DIAGNOSIS — D631 Anemia in chronic kidney disease: Secondary | ICD-10-CM | POA: Diagnosis not present

## 2015-07-03 DIAGNOSIS — Z1231 Encounter for screening mammogram for malignant neoplasm of breast: Secondary | ICD-10-CM | POA: Diagnosis not present

## 2015-07-03 DIAGNOSIS — Z853 Personal history of malignant neoplasm of breast: Secondary | ICD-10-CM | POA: Diagnosis not present

## 2015-07-03 LAB — HM MAMMOGRAPHY

## 2015-07-04 ENCOUNTER — Encounter: Payer: Self-pay | Admitting: *Deleted

## 2015-07-04 ENCOUNTER — Telehealth: Payer: Self-pay

## 2015-07-04 ENCOUNTER — Encounter: Payer: Self-pay | Admitting: Family Medicine

## 2015-07-04 DIAGNOSIS — D508 Other iron deficiency anemias: Secondary | ICD-10-CM | POA: Diagnosis not present

## 2015-07-04 DIAGNOSIS — D631 Anemia in chronic kidney disease: Secondary | ICD-10-CM | POA: Diagnosis not present

## 2015-07-04 DIAGNOSIS — N186 End stage renal disease: Secondary | ICD-10-CM | POA: Diagnosis not present

## 2015-07-04 DIAGNOSIS — N2581 Secondary hyperparathyroidism of renal origin: Secondary | ICD-10-CM | POA: Diagnosis not present

## 2015-07-04 NOTE — Telephone Encounter (Signed)
I need to talk with Ms [name] about when she is able to get the next EGD done.

## 2015-07-07 ENCOUNTER — Other Ambulatory Visit: Payer: Self-pay

## 2015-07-07 DIAGNOSIS — N2581 Secondary hyperparathyroidism of renal origin: Secondary | ICD-10-CM | POA: Diagnosis not present

## 2015-07-07 DIAGNOSIS — D508 Other iron deficiency anemias: Secondary | ICD-10-CM | POA: Diagnosis not present

## 2015-07-07 DIAGNOSIS — N186 End stage renal disease: Secondary | ICD-10-CM | POA: Diagnosis not present

## 2015-07-07 DIAGNOSIS — K31819 Angiodysplasia of stomach and duodenum without bleeding: Secondary | ICD-10-CM

## 2015-07-07 DIAGNOSIS — D631 Anemia in chronic kidney disease: Secondary | ICD-10-CM | POA: Diagnosis not present

## 2015-07-08 ENCOUNTER — Other Ambulatory Visit: Payer: Self-pay

## 2015-07-08 NOTE — Telephone Encounter (Signed)
Procedure moved to 07/31/15 at 10:00 am in [facility].

## 2015-07-09 DIAGNOSIS — M9903 Segmental and somatic dysfunction of lumbar region: Secondary | ICD-10-CM | POA: Diagnosis not present

## 2015-07-09 DIAGNOSIS — D508 Other iron deficiency anemias: Secondary | ICD-10-CM | POA: Diagnosis not present

## 2015-07-09 DIAGNOSIS — M9905 Segmental and somatic dysfunction of pelvic region: Secondary | ICD-10-CM | POA: Diagnosis not present

## 2015-07-09 DIAGNOSIS — M955 Acquired deformity of pelvis: Secondary | ICD-10-CM | POA: Diagnosis not present

## 2015-07-09 DIAGNOSIS — D631 Anemia in chronic kidney disease: Secondary | ICD-10-CM | POA: Diagnosis not present

## 2015-07-09 DIAGNOSIS — N186 End stage renal disease: Secondary | ICD-10-CM | POA: Diagnosis not present

## 2015-07-09 DIAGNOSIS — N2581 Secondary hyperparathyroidism of renal origin: Secondary | ICD-10-CM | POA: Diagnosis not present

## 2015-07-09 DIAGNOSIS — M5136 Other intervertebral disc degeneration, lumbar region: Secondary | ICD-10-CM | POA: Diagnosis not present

## 2015-07-11 DIAGNOSIS — N186 End stage renal disease: Secondary | ICD-10-CM | POA: Diagnosis not present

## 2015-07-11 DIAGNOSIS — N2581 Secondary hyperparathyroidism of renal origin: Secondary | ICD-10-CM | POA: Diagnosis not present

## 2015-07-11 DIAGNOSIS — D508 Other iron deficiency anemias: Secondary | ICD-10-CM | POA: Diagnosis not present

## 2015-07-11 DIAGNOSIS — D631 Anemia in chronic kidney disease: Secondary | ICD-10-CM | POA: Diagnosis not present

## 2015-07-14 DIAGNOSIS — D631 Anemia in chronic kidney disease: Secondary | ICD-10-CM | POA: Diagnosis not present

## 2015-07-14 DIAGNOSIS — N2581 Secondary hyperparathyroidism of renal origin: Secondary | ICD-10-CM | POA: Diagnosis not present

## 2015-07-14 DIAGNOSIS — D508 Other iron deficiency anemias: Secondary | ICD-10-CM | POA: Diagnosis not present

## 2015-07-14 DIAGNOSIS — N186 End stage renal disease: Secondary | ICD-10-CM | POA: Diagnosis not present

## 2015-07-16 DIAGNOSIS — N2581 Secondary hyperparathyroidism of renal origin: Secondary | ICD-10-CM | POA: Diagnosis not present

## 2015-07-16 DIAGNOSIS — N186 End stage renal disease: Secondary | ICD-10-CM | POA: Diagnosis not present

## 2015-07-16 DIAGNOSIS — D631 Anemia in chronic kidney disease: Secondary | ICD-10-CM | POA: Diagnosis not present

## 2015-07-16 DIAGNOSIS — D508 Other iron deficiency anemias: Secondary | ICD-10-CM | POA: Diagnosis not present

## 2015-07-17 ENCOUNTER — Encounter (HOSPITAL_COMMUNITY): Payer: Self-pay | Admitting: *Deleted

## 2015-07-17 ENCOUNTER — Telehealth: Payer: Self-pay | Admitting: Cardiology

## 2015-07-17 NOTE — Telephone Encounter (Signed)
Left message to call back  

## 2015-07-17 NOTE — Telephone Encounter (Signed)
New message  Pt called request a call back to discuss if she should continue taking the Asprin being that she has an endoscopy completed on April 4th.   Asked pt if she could have the office is ordering the endoscopy to call and request the clearance on her behalf .

## 2015-07-18 DIAGNOSIS — N186 End stage renal disease: Secondary | ICD-10-CM | POA: Diagnosis not present

## 2015-07-18 DIAGNOSIS — N2581 Secondary hyperparathyroidism of renal origin: Secondary | ICD-10-CM | POA: Diagnosis not present

## 2015-07-18 DIAGNOSIS — D631 Anemia in chronic kidney disease: Secondary | ICD-10-CM | POA: Diagnosis not present

## 2015-07-18 DIAGNOSIS — D508 Other iron deficiency anemias: Secondary | ICD-10-CM | POA: Diagnosis not present

## 2015-07-21 DIAGNOSIS — D631 Anemia in chronic kidney disease: Secondary | ICD-10-CM | POA: Diagnosis not present

## 2015-07-21 DIAGNOSIS — D508 Other iron deficiency anemias: Secondary | ICD-10-CM | POA: Diagnosis not present

## 2015-07-21 DIAGNOSIS — N2581 Secondary hyperparathyroidism of renal origin: Secondary | ICD-10-CM | POA: Diagnosis not present

## 2015-07-21 DIAGNOSIS — N186 End stage renal disease: Secondary | ICD-10-CM | POA: Diagnosis not present

## 2015-07-22 NOTE — Telephone Encounter (Signed)
F/u  Pt returning Rn phone call. Please call back and discuss.   

## 2015-07-22 NOTE — Telephone Encounter (Signed)
left message to callback to see if we can be some assistance

## 2015-07-22 NOTE — Telephone Encounter (Signed)
Pt has colonoscopy on Thursday and wanted to know if recommended to discontinue/hold her daily 81mg  ASA. Advised no interruption to this med, minimal concern for bleeding. No other concerns or questions from patient. Routed for confirmation.

## 2015-07-22 NOTE — Telephone Encounter (Signed)
That is correct. Thanks

## 2015-07-23 DIAGNOSIS — N186 End stage renal disease: Secondary | ICD-10-CM | POA: Diagnosis not present

## 2015-07-23 DIAGNOSIS — N2581 Secondary hyperparathyroidism of renal origin: Secondary | ICD-10-CM | POA: Diagnosis not present

## 2015-07-23 DIAGNOSIS — D631 Anemia in chronic kidney disease: Secondary | ICD-10-CM | POA: Diagnosis not present

## 2015-07-23 DIAGNOSIS — D508 Other iron deficiency anemias: Secondary | ICD-10-CM | POA: Diagnosis not present

## 2015-07-25 DIAGNOSIS — N2581 Secondary hyperparathyroidism of renal origin: Secondary | ICD-10-CM | POA: Diagnosis not present

## 2015-07-25 DIAGNOSIS — D508 Other iron deficiency anemias: Secondary | ICD-10-CM | POA: Diagnosis not present

## 2015-07-25 DIAGNOSIS — N186 End stage renal disease: Secondary | ICD-10-CM | POA: Diagnosis not present

## 2015-07-25 DIAGNOSIS — D631 Anemia in chronic kidney disease: Secondary | ICD-10-CM | POA: Diagnosis not present

## 2015-07-27 DIAGNOSIS — Z992 Dependence on renal dialysis: Secondary | ICD-10-CM | POA: Diagnosis not present

## 2015-07-27 DIAGNOSIS — N186 End stage renal disease: Secondary | ICD-10-CM | POA: Diagnosis not present

## 2015-07-27 DIAGNOSIS — E1129 Type 2 diabetes mellitus with other diabetic kidney complication: Secondary | ICD-10-CM | POA: Diagnosis not present

## 2015-07-28 ENCOUNTER — Other Ambulatory Visit: Payer: Self-pay | Admitting: Vascular Surgery

## 2015-07-28 DIAGNOSIS — N186 End stage renal disease: Secondary | ICD-10-CM | POA: Diagnosis not present

## 2015-07-28 DIAGNOSIS — D631 Anemia in chronic kidney disease: Secondary | ICD-10-CM | POA: Diagnosis not present

## 2015-07-28 DIAGNOSIS — N2581 Secondary hyperparathyroidism of renal origin: Secondary | ICD-10-CM | POA: Diagnosis not present

## 2015-07-30 DIAGNOSIS — N186 End stage renal disease: Secondary | ICD-10-CM | POA: Diagnosis not present

## 2015-07-30 DIAGNOSIS — M955 Acquired deformity of pelvis: Secondary | ICD-10-CM | POA: Diagnosis not present

## 2015-07-30 DIAGNOSIS — D631 Anemia in chronic kidney disease: Secondary | ICD-10-CM | POA: Diagnosis not present

## 2015-07-30 DIAGNOSIS — M9905 Segmental and somatic dysfunction of pelvic region: Secondary | ICD-10-CM | POA: Diagnosis not present

## 2015-07-30 DIAGNOSIS — N2581 Secondary hyperparathyroidism of renal origin: Secondary | ICD-10-CM | POA: Diagnosis not present

## 2015-07-30 DIAGNOSIS — M9903 Segmental and somatic dysfunction of lumbar region: Secondary | ICD-10-CM | POA: Diagnosis not present

## 2015-07-30 DIAGNOSIS — M5136 Other intervertebral disc degeneration, lumbar region: Secondary | ICD-10-CM | POA: Diagnosis not present

## 2015-07-31 ENCOUNTER — Ambulatory Visit (HOSPITAL_COMMUNITY): Payer: Medicare Other | Admitting: Anesthesiology

## 2015-07-31 ENCOUNTER — Ambulatory Visit (HOSPITAL_COMMUNITY)
Admission: RE | Admit: 2015-07-31 | Discharge: 2015-07-31 | Disposition: A | Discharge status: Home / Self Care | Payer: Medicare Other | Source: Ambulatory Visit | Attending: Gastroenterology | Admitting: Gastroenterology

## 2015-07-31 ENCOUNTER — Encounter (HOSPITAL_COMMUNITY)
Admission: RE | Disposition: A | Discharge status: Home / Self Care | Payer: Self-pay | Source: Ambulatory Visit | Attending: Gastroenterology

## 2015-07-31 ENCOUNTER — Encounter (HOSPITAL_COMMUNITY): Payer: Self-pay

## 2015-07-31 DIAGNOSIS — N186 End stage renal disease: Secondary | ICD-10-CM | POA: Diagnosis not present

## 2015-07-31 DIAGNOSIS — I2510 Atherosclerotic heart disease of native coronary artery without angina pectoris: Secondary | ICD-10-CM | POA: Diagnosis not present

## 2015-07-31 DIAGNOSIS — Z7982 Long term (current) use of aspirin: Secondary | ICD-10-CM | POA: Insufficient documentation

## 2015-07-31 DIAGNOSIS — M19042 Primary osteoarthritis, left hand: Secondary | ICD-10-CM | POA: Insufficient documentation

## 2015-07-31 DIAGNOSIS — Q2733 Arteriovenous malformation of digestive system vessel: Secondary | ICD-10-CM | POA: Diagnosis not present

## 2015-07-31 DIAGNOSIS — Z79899 Other long term (current) drug therapy: Secondary | ICD-10-CM | POA: Insufficient documentation

## 2015-07-31 DIAGNOSIS — Z853 Personal history of malignant neoplasm of breast: Secondary | ICD-10-CM | POA: Diagnosis not present

## 2015-07-31 DIAGNOSIS — I509 Heart failure, unspecified: Secondary | ICD-10-CM | POA: Diagnosis not present

## 2015-07-31 DIAGNOSIS — I130 Hypertensive heart and chronic kidney disease with heart failure and stage 1 through stage 4 chronic kidney disease, or unspecified chronic kidney disease: Secondary | ICD-10-CM | POA: Diagnosis not present

## 2015-07-31 DIAGNOSIS — E785 Hyperlipidemia, unspecified: Secondary | ICD-10-CM | POA: Insufficient documentation

## 2015-07-31 DIAGNOSIS — Z87891 Personal history of nicotine dependence: Secondary | ICD-10-CM | POA: Insufficient documentation

## 2015-07-31 DIAGNOSIS — Z992 Dependence on renal dialysis: Secondary | ICD-10-CM | POA: Insufficient documentation

## 2015-07-31 DIAGNOSIS — Z951 Presence of aortocoronary bypass graft: Secondary | ICD-10-CM | POA: Insufficient documentation

## 2015-07-31 DIAGNOSIS — D500 Iron deficiency anemia secondary to blood loss (chronic): Secondary | ICD-10-CM

## 2015-07-31 DIAGNOSIS — K31811 Angiodysplasia of stomach and duodenum with bleeding: Secondary | ICD-10-CM | POA: Diagnosis not present

## 2015-07-31 DIAGNOSIS — M19041 Primary osteoarthritis, right hand: Secondary | ICD-10-CM | POA: Diagnosis not present

## 2015-07-31 DIAGNOSIS — K219 Gastro-esophageal reflux disease without esophagitis: Secondary | ICD-10-CM | POA: Insufficient documentation

## 2015-07-31 DIAGNOSIS — K31819 Angiodysplasia of stomach and duodenum without bleeding: Secondary | ICD-10-CM | POA: Diagnosis not present

## 2015-07-31 HISTORY — DX: Family history of other specified conditions: Z84.89

## 2015-07-31 HISTORY — PX: HOT HEMOSTASIS: SHX5433

## 2015-07-31 HISTORY — DX: Personal history of other medical treatment: Z92.89

## 2015-07-31 HISTORY — PX: ESOPHAGOGASTRODUODENOSCOPY (EGD) WITH PROPOFOL: SHX5813

## 2015-07-31 SURGERY — ESOPHAGOGASTRODUODENOSCOPY (EGD) WITH PROPOFOL
Anesthesia: Monitor Anesthesia Care

## 2015-07-31 MED ORDER — SODIUM CHLORIDE 0.9 % IV SOLN
INTRAVENOUS | Status: DC
Start: 2015-07-31 — End: 2015-07-31
  Administered 2015-07-31: 09:00:00 via INTRAVENOUS

## 2015-07-31 MED ORDER — GLYCOPYRROLATE 0.2 MG/ML IJ SOLN
INTRAMUSCULAR | Status: AC
  Filled 2015-07-31: qty 1

## 2015-07-31 MED ORDER — LIDOCAINE HCL (CARDIAC) 20 MG/ML IV SOLN
INTRAVENOUS | Status: AC
  Filled 2015-07-31: qty 5

## 2015-07-31 MED ORDER — PROPOFOL 10 MG/ML IV BOLUS
INTRAVENOUS | Status: DC | PRN
Start: 2015-07-31 — End: 2015-07-31
  Administered 2015-07-31 (×2): 50 mg via INTRAVENOUS

## 2015-07-31 MED ORDER — PROPOFOL 500 MG/50ML IV EMUL
INTRAVENOUS | Status: DC | PRN
  Administered 2015-07-31: 450 ug/kg/min via INTRAVENOUS

## 2015-07-31 MED ORDER — PHENYLEPHRINE 40 MCG/ML (10ML) SYRINGE FOR IV PUSH (FOR BLOOD PRESSURE SUPPORT)
PREFILLED_SYRINGE | INTRAVENOUS | Status: AC
  Filled 2015-07-31: qty 10

## 2015-07-31 MED ORDER — GLYCOPYRROLATE 0.2 MG/ML IJ SOLN
INTRAMUSCULAR | Status: DC | PRN
  Administered 2015-07-31: 0.1 mg via INTRAVENOUS

## 2015-07-31 MED ORDER — PHENYLEPHRINE HCL 10 MG/ML IJ SOLN
INTRAMUSCULAR | Status: DC | PRN
Start: 2015-07-31 — End: 2015-07-31
  Administered 2015-07-31: 80 ug via INTRAVENOUS
  Administered 2015-07-31 (×2): 120 ug via INTRAVENOUS

## 2015-07-31 MED ORDER — LIDOCAINE HCL (CARDIAC) 20 MG/ML IV SOLN
INTRAVENOUS | Status: DC | PRN
  Administered 2015-07-31: 50 mg via INTRAVENOUS

## 2015-07-31 MED ORDER — PROPOFOL 10 MG/ML IV BOLUS
INTRAVENOUS | Status: AC
  Filled 2015-07-31: qty 40

## 2015-07-31 SURGICAL SUPPLY — 15 items

## 2015-07-31 NOTE — Discharge Instructions (Signed)
Gastrointestinal Endoscopy, Care After °Refer to this sheet in the next few weeks. These instructions provide you with information on caring for yourself after your procedure. Your caregiver may also give you more specific instructions. Your treatment has been planned according to current medical practices, but problems sometimes occur. Call your caregiver if you have any problems or questions after your procedure. °HOME CARE INSTRUCTIONS °· If you were given medicine to help you relax (sedative), do not drive, operate machinery, or sign important documents for 24 hours. °· Avoid alcohol and hot or warm beverages for the first 24 hours after the procedure. °· Only take over-the-counter or prescription medicines for pain, discomfort, or fever as directed by your caregiver. You may resume taking your normal medicines unless your caregiver tells you otherwise. Ask your caregiver when you may resume taking medicines that may cause bleeding, such as aspirin, clopidogrel, or warfarin. °· You may return to your normal diet and activities on the day after your procedure, or as directed by your caregiver. Walking may help to reduce any bloated feeling in your abdomen. °· Drink enough fluids to keep your urine clear or pale yellow. °· You may gargle with salt water if you have a sore throat. °SEEK IMMEDIATE MEDICAL CARE IF: °· You have severe nausea or vomiting. °· You have severe abdominal pain, abdominal cramps that last longer than 6 hours, or abdominal swelling (distention). °· You have severe shoulder or back pain. °· You have trouble swallowing. °· You have shortness of breath, your breathing is shallow, or you are breathing faster than normal. °· You have a fever or a rapid heartbeat. °· You vomit blood or material that looks like coffee grounds. °· You have bloody, black, or tarry stools. °MAKE SURE YOU: °· Understand these instructions. °· Will watch your condition. °· Will get help right away if you are not doing  well or get worse. °  °This information is not intended to replace advice given to you by your health care provider. Make sure you discuss any questions you have with your health care provider. °  °Document Released: 10/28/2003 Document Revised: 04/05/2014 Document Reviewed: 06/15/2011 °Elsevier Interactive Patient Education ©2016 Elsevier Inc. ° °

## 2015-07-31 NOTE — H&P (Signed)
[facility] History and Physical   Primary Care Physician:  [name], MD   Reason for Procedure:  GAVE Plan:    EGD with APC    HPI: [name] is a 82 y.o. female with multiple comorbidities and GAVE here for EGD with APC for obliteration. Denies any nausea, vomiting, abdominal pain, melena or bright red blood per rectum    Past Medical History  Diagnosis Date  . Hyperlipidemia   . Hypertension   . Facial droop 1935    acquired during forceps delivery, some residual R visual loss  . Hx of breast cancer 2004  . GERD (gastroesophageal reflux disease)   . Bradycardia 2014    a. Noted 06/2012.  Marland Kitchen Hypotension     a. Associated w/ dialysis.  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 [name])  . Osteoporosis 11/2013    DEXA -3.4 radius  . GAVE (gastric antral vascular ectasia) 08/2014    s/p EGD with APC  . Anemia in chronic kidney disease     IV iron infusion and anemia from GAVE blood loss.  . End stage renal disease on dialysis [facility])     HD M,W,F [place] AVF now LUE AVF  . Hearing loss     Bilateral hearing aids  . Sight impaired     Left: wears contact. Right: diminished peripheral vision  . Cardiac murmur     a. thought due to AVF (2D echo 06/2012 without significant valvular disease).   Marland Kitchen CAD (coronary artery disease), native coronary artery     3 vessel CAD by cath 04/2015  . Arthritis      hands  . Breast cancer ([place])     Left Breast 13 yrs. ago  . History of blood transfusion 5 weeks ago  . Family history of adverse reaction to anesthesia     brother trouble waking up and confusion, now deceased    Past Surgical History  Procedure Laterality Date  . Cataract extraction Bilateral 1980  . Av fistula placement  07/20/10    Right brachiocephalic AVF  . Umbilical hernia repair  2011  . Dexa  2013    [facility]  . US echocardiography  06/2012    LVH, EF 65%, grade 1 diastol dysfunction, mod dliated LAD   . Eye surgery  1966    regular cataract  . Breast lumpectomy Left   . Breast biopsy Left   . Bascilic vein transposition Left 07/24/2013    Procedure: BASILIC VEIN TRANSPOSITION;  Surgeon: [name], MD;  Location: [place];  Service: Vascular;  Laterality: Left;  . Shuntogram N/A 04/16/2011    Procedure: Earney Mallet;  Surgeon: [name], MD;  Location: [facility] CATH LAB;  Service: Cardiovascular;  Laterality: N/A;  . Shuntogram Right 07/05/2013    Procedure: FISTULOGRAM;  Surgeon: [name], MD;  Location: [facility] CATH LAB;  Service: Cardiovascular;  Laterality: Right;  . Esophagogastroduodenoscopy N/A 04/09/2014    Procedure: ESOPHAGOGASTRODUODENOSCOPY (EGD);  Surgeon: [name], MD;  Location: [place];  Service: Endoscopy;  Laterality: N/A;  . Peripheral vascular catheterization N/A 08/15/2014    Procedure: A/V Shuntogram/Fistulagram;  Surgeon: [name], MD;  Location: [place] CV LAB;  Service: Cardiovascular;  Laterality: N/A;  . Esophagogastroduodenoscopy N/A 09/23/2014    Procedure: ESOPHAGOGASTRODUODENOSCOPY (EGD);  Surgeon: [name], MD;  Location: [place];  Service: Endoscopy;  Laterality: N/A;  . Hernia repair    .  Esophagogastroduodenoscopy (egd) with propofol N/A 11/12/2014    Procedure: ESOPHAGOGASTRODUODENOSCOPY (EGD) WITH PROPOFOL;  Surgeon: [name], MD;  Location: [facility];  Service: Endoscopy;  Laterality: N/A;  . Hot hemostasis N/A 11/12/2014    Procedure: HOT HEMOSTASIS (ARGON PLASMA COAGULATION/BICAP);  Surgeon: [name], MD;  Location: [facility];  Service: Endoscopy;  Laterality: N/A;  . Esophagogastroduodenoscopy N/A 12/26/2014    neg H pylori, benign biopsies; ESOPHAGOGASTRODUODENOSCOPY (EGD);  Surgeon: [name], MD  . Cardiac catheterization N/A 04/10/2015    Procedure: Left Heart Cath and Coronary Angiography;  Surgeon: [name], MD;  Location: [place] CV LAB;  Service: Cardiovascular;  Laterality:  N/A;  . Peripheral vascular catheterization N/A 04/28/2015    Procedure: A/V Shuntogram/Fistulagram;  Surgeon: [name], MD;  Location: [place] CV LAB;  Service: Cardiovascular;  Laterality: N/A;  . Peripheral vascular catheterization Left 04/28/2015    Procedure: A/V Shunt Intervention;  Surgeon: [name], MD;  Location: [place] CV LAB;  Service: Cardiovascular;  Laterality: Left;  . Esophagogastroduodenoscopy N/A 06/15/2015    Procedure: ESOPHAGOGASTRODUODENOSCOPY (EGD);  Surgeon: Milus Banister, MD;  Location: [place];  Service: Endoscopy;  Laterality: N/A;  . Esophagogastroduodenoscopy N/A 06/27/2015    Procedure: ESOPHAGOGASTRODUODENOSCOPY (EGD);  Surgeon: Mauri Pole, MD;  Location: [facility];  Service: Endoscopy;  Laterality: N/A;  . Hot hemostasis N/A 06/27/2015    Procedure: HOT HEMOSTASIS (ARGON PLASMA COAGULATION/BICAP);  Surgeon: Mauri Pole, MD;  Location: [facility];  Service: Endoscopy;  Laterality: N/A;    Prior to Admission medications   Medication Sig Start Date End Date Taking? Authorizing Provider  acetaminophen (TYLENOL) 500 MG tablet Take 1,000 mg by mouth every 6 (six) hours as needed for moderate pain.   Yes Historical Provider, MD  amLODipine (NORVASC) 2.5 MG tablet Take 2.5 mg by mouth at bedtime.   Yes Historical Provider, MD  Artificial Tear Ointment (REFRESH P.M. OP) Place 1 application into the right eye daily.   Yes Historical Provider, MD  aspirin EC 81 MG tablet Take 1 tablet (81 mg total) by mouth daily. 03/06/15  Yes Lelon Perla, MD  atorvastatin (LIPITOR) 80 MG tablet Take 1 tablet (80 mg total) by mouth daily. 05/06/15  Yes Lelon Perla, MD  B Complex-C-Folic Acid (RENA-VITE PO) Take 1 tablet by mouth daily with breakfast.    Yes Historical Provider, MD  Calcium Carbonate Antacid (TUMS ULTRA 1000 PO) Take 3,000 mg by mouth 3 (three) times daily with meals.    Yes Historical Provider, MD  cholecalciferol (VITAMIN D)  1000 UNITS tablet Take 3,000 Units by mouth every Monday, Wednesday, and Friday.    Yes Historical Provider, MD  fexofenadine (ALLEGRA) 180 MG tablet Take 180 mg by mouth 2 (two) times daily.   Yes Historical Provider, MD  metoprolol tartrate (LOPRESSOR) 25 MG tablet Take 0.5 tablets (12.5 mg total) by mouth 2 (two) times daily. 06/15/15  Yes Theodis Blaze, MD  pantoprazole (PROTONIX) 40 MG tablet Take 1 tablet (40 mg total) by mouth 2 (two) times daily. Patient taking differently: Take 80 mg by mouth daily.  05/08/15  Yes Mauri Pole, MD  PRESCRIPTION MEDICATION [facility] Anemia Therapy    Historical Provider, MD    Current Facility-Administered Medications  Medication Dose Route Frequency Provider Last Rate Last Dose  . 0.9 %  sodium chloride infusion   Intravenous Continuous Mauri Pole, MD        Allergies as  of 07/07/2015 - Review Complete 06/27/2015  Allergen Reaction Noted  . Sulfa antibiotics Rash 12/14/2010    Family History  Problem Relation Age of Onset  . Heart disease Brother     Congenital  . Hypertension Brother   . Diabetes Brother   . Cancer Father     stomach  . CAD Father     MI at age 61  . Heart disease Father   . Heart attack Father   . Cancer Sister     female (uterus?)  . Hypertension Mother     Social History   Social History  . Marital Status: Single    Spouse Name: N/A  . Number of Children: N/A  . Years of Education: N/A   Occupational History  . 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 Smoking status: Former Smoker -- 2 years    Types: Cigarettes    Quit date: 03/29/1953  . Smokeless tobacco: Never Used  . Alcohol Use: 0.0 oz/week    0 Standard drinks or equivalent per week     Comment: occasional  . Drug Use: No  . Sexual Activity: Not on file   Other Topics Concern  . Not on file   Social History Narrative   As of 03/2013. Lives alone with her small dog.   Bother and sister in law died last year.     Occupation: retired, was Information systems manager for [employer]   Edu: some college    Review of Systems:  All other review of systems negative except as mentioned in the HPI.  Physical Exam: Vital signs in last 24 hours: Temp:  [97.6 F (36.4 C)] 97.6 F (36.4 C) (05/04 0835) Pulse Rate:  [70] 70 (05/04 0835) Resp:  [11] 11 (05/04 0835) BP: (176)/(60) 176/60 mmHg (05/04 0835) SpO2:  [99 %] 99 % (05/04 0835) Weight:  [114 lb (51.71 kg)] 114 lb (51.71 kg) (05/04 0835)   General:   Alert,  Well-developed, well-nourished, pleasant and cooperative in NAD Lungs:  Clear throughout to auscultation.   Heart:  Regular rate and rhythm; no murmurs, clicks, rubs,  or gallops. Abdomen:  Soft, nontender and nondistended. Normal bowel sounds.   Neuro/Psych:  Alert and cooperative. Normal mood and affect. A and O x 3   @K .Denzil Magnuson, MD [facility] [phone] (pager) 07/31/2015 9:46 AM@

## 2015-07-31 NOTE — Anesthesia Postprocedure Evaluation (Signed)
Anesthesia Post Note  Patient: [name]  Procedure(s) Performed: Procedure(s) (LRB): ESOPHAGOGASTRODUODENOSCOPY (EGD) WITH PROPOFOL (N/A) HOT HEMOSTASIS (ARGON PLASMA COAGULATION/BICAP) (N/A)  Patient location during evaluation: PACU Anesthesia Type: MAC Level of consciousness: awake and alert Pain management: pain level controlled Vital Signs Assessment: post-procedure vital signs reviewed and stable Respiratory status: spontaneous breathing, nonlabored ventilation, respiratory function stable and patient connected to nasal cannula oxygen Cardiovascular status: stable and blood pressure returned to baseline Anesthetic complications: no    Last Vitals:  Filed Vitals:   07/31/15 1126 07/31/15 1135  BP: 136/51 157/66  Pulse:  72  Temp:    Resp:  21    Last Pain: There were no vitals filed for this visit.               Zenaida Deed

## 2015-07-31 NOTE — Transfer of Care (Signed)
Immediate Anesthesia Transfer of Care Note  Patient: [name]  Procedure(s) Performed: Procedure(s): ESOPHAGOGASTRODUODENOSCOPY (EGD) WITH PROPOFOL (N/A) HOT HEMOSTASIS (ARGON PLASMA COAGULATION/BICAP) (N/A)  Patient Location: PACU  Anesthesia Type:MAC  Level of Consciousness: awake, alert , oriented and patient cooperative  Airway & Oxygen Therapy: Patient Spontanous Breathing and Patient connected to nasal cannula oxygen  Post-op Assessment: Report given to RN, Post -op Vital signs reviewed and stable and Patient moving all extremities X 4  Post vital signs: stable  Last Vitals:  Filed Vitals:   07/31/15 0835 07/31/15 1102  BP: 176/60 87/36  Pulse: 70 71  Temp: 36.4 C 36.4 C  Resp: 11 24    Last Pain: There were no vitals filed for this visit.       Complications: No apparent anesthesia complications

## 2015-07-31 NOTE — Op Note (Signed)
[facility] Patient Name: [name] Procedure Date: 07/31/2015 MRN: [id number] Attending MD: Mauri Pole , MD Date of Birth: 03/05/1932 CSN: [id number] Age: 82 Admit Type: Outpatient Procedure:                Upper GI endoscopy Indications:              Iron deficiency anemia secondary to chronic blood                            loss, Arteriovenous malformation in the stomach Providers:                Mauri Pole, MD, Tory Emerald, RN, Alfonso Patten, Technician, Enrigue Catena, CRNA Referring MD:              Medicines:                Monitored Anesthesia Care Complications:            No immediate complications. Estimated Blood Loss:     Estimated blood loss: none. Procedure:                Pre-Anesthesia Assessment:                           - Prior to the procedure, a History and Physical                            was performed, and patient medications and                            allergies were reviewed. The patient's tolerance of                            previous anesthesia was also reviewed. The risks                            and benefits of the procedure and the sedation                            options and risks were discussed with the patient.                            All questions were answered, and informed consent                            was obtained. Anticoagulants: The patient has taken                            aspirin. It was decided not to withhold this                            medication prior to procedure. ASA Grade  Assessment: IV - A patient with severe systemic                            disease that is a constant threat to life. After                            reviewing the risks and benefits, the patient was                            deemed in satisfactory condition to undergo the                            procedure.                           After obtaining  informed consent, the endoscope was                            passed under direct vision. Throughout the                            procedure, the patient's blood pressure, pulse, and                            oxygen saturations were monitored continuously. The                            Endoscope was introduced through the mouth, and                            advanced to the second part of duodenum. The upper                            GI endoscopy was accomplished without difficulty.                            The patient tolerated the procedure well. Scope In: Scope Out: Findings:      The esophagus was normal.      Multiple less than 5 mm stigmata of recent bleeding angioectasias were       found in the gastric antrum. Coagulation for bleeding prevention using       argon plasma was successful.      The first portion of the duodenum and second portion of the duodenum       were normal. Impression:               - Normal esophagus.                           - Multiple recently bleeding angioectasias in the                            stomach. Treated with argon plasma coagulation                            (  APC).                           - Normal first portion of the duodenum and second                            portion of the duodenum.                           - No specimens collected. Moderate Sedation:      N/A- Per Anesthesia Care Recommendation:           - Resume previous diet.                           - Continue present medications.                           - Repeat upper endoscopy in 3 months for                            surveillance.                           - Return to GI clinic PRN. Procedure Code(s):        --- Professional ---                           [phone], Esophagogastroduodenoscopy, flexible,                            transoral; with control of bleeding, any method Diagnosis Code(s):        --- Professional ---                           [id number],  Angiodysplasia of stomach and duodenum                            with bleeding                           D50.0, Iron deficiency anemia secondary to blood                            loss (chronic)                           Q27.33, Arteriovenous malformation of digestive                            system vessel CPT copyright 2016 [employer]. All rights reserved. The codes documented in this report are preliminary and upon coder review may  be revised to meet current compliance requirements. Mauri Pole, MD 07/31/2015 11:02:38 AM This report has been signed electronically. Number of Addenda: 0

## 2015-07-31 NOTE — Anesthesia Preprocedure Evaluation (Addendum)
Anesthesia Evaluation  Patient identified by MRN, date of birth, ID band Patient awake    Reviewed: Allergy & Precautions, H&P , NPO status , Patient's Chart, lab work & pertinent test results  History of Anesthesia Complications Negative for: history of anesthetic complications  Airway Mallampati: II  TM Distance: >3 FB Neck ROM: full    Dental  (+) Edentulous Upper, Edentulous Lower   Pulmonary former smoker,    Pulmonary exam normal breath sounds clear to auscultation- rhonchi       Cardiovascular hypertension, Pt. on medications + CAD (Severe 3vessel CAD- high risk CABG candidate- medical management. Normal EF), + Past MI and +CHF  Normal cardiovascular exam Rhythm:regular Rate:Normal     Neuro/Psych negative neurological ROS     GI/Hepatic Neg liver ROS, GERD  ,  Endo/Other  negative endocrine ROS  Renal/GU ESRF and DialysisRenal disease     Musculoskeletal  (+) Arthritis ,   Abdominal   Peds  Hematology negative hematology ROS (+) anemia ,   Anesthesia Other Findings She denies any issue with CAD, usually presents as dyspnea with minimal exertion and right jaw pain when she has ischemia, not a CABG candidate per surgery, had dialysis yesterday, she has tolerated propofol anesthesia well for these repeated EGD hot hemostasis for her GAVE condition in past  Reproductive/Obstetrics negative OB ROS                            Lab Results  Component Value Date   CREATININE 5.81* 06/19/2015   BUN 49* 06/19/2015   NA 142 06/27/2015   K 3.5 06/27/2015   CL 98* 06/19/2015   CO2 29 06/19/2015   Lab Results  Component Value Date   WBC 5.9 06/19/2015   HGB 12.6 06/27/2015   HCT 37.0 06/27/2015   MCV 91.3 06/19/2015   PLT 242 06/19/2015    Anesthesia Physical  Anesthesia Plan  ASA: IV  Anesthesia Plan: MAC   Post-op Pain Management:    Induction: Intravenous  Airway  Management Planned: Nasal Cannula  Additional Equipment:   Intra-op Plan:   Post-operative Plan:   Informed Consent: I have reviewed the patients History and Physical, chart, labs and discussed the procedure including the risks, benefits and alternatives for the proposed anesthesia with the patient or authorized representative who has indicated his/her understanding and acceptance.   Dental Advisory Given  Plan Discussed with: Anesthesiologist, CRNA and Surgeon  Anesthesia Plan Comments:         Anesthesia Quick Evaluation

## 2015-08-01 DIAGNOSIS — N186 End stage renal disease: Secondary | ICD-10-CM | POA: Diagnosis not present

## 2015-08-01 DIAGNOSIS — D631 Anemia in chronic kidney disease: Secondary | ICD-10-CM | POA: Diagnosis not present

## 2015-08-01 DIAGNOSIS — N2581 Secondary hyperparathyroidism of renal origin: Secondary | ICD-10-CM | POA: Diagnosis not present

## 2015-08-03 ENCOUNTER — Encounter (HOSPITAL_COMMUNITY): Payer: Self-pay | Admitting: Gastroenterology

## 2015-08-04 DIAGNOSIS — N186 End stage renal disease: Secondary | ICD-10-CM | POA: Diagnosis not present

## 2015-08-04 DIAGNOSIS — D631 Anemia in chronic kidney disease: Secondary | ICD-10-CM | POA: Diagnosis not present

## 2015-08-04 DIAGNOSIS — N2581 Secondary hyperparathyroidism of renal origin: Secondary | ICD-10-CM | POA: Diagnosis not present

## 2015-08-06 ENCOUNTER — Encounter: Payer: Self-pay | Admitting: Family Medicine

## 2015-08-06 ENCOUNTER — Encounter (HOSPITAL_COMMUNITY): Payer: Self-pay

## 2015-08-06 ENCOUNTER — Ambulatory Visit (HOSPITAL_COMMUNITY): Admit: 2015-08-06 | Payer: Self-pay | Admitting: Gastroenterology

## 2015-08-06 DIAGNOSIS — D631 Anemia in chronic kidney disease: Secondary | ICD-10-CM | POA: Diagnosis not present

## 2015-08-06 DIAGNOSIS — N2581 Secondary hyperparathyroidism of renal origin: Secondary | ICD-10-CM | POA: Diagnosis not present

## 2015-08-06 DIAGNOSIS — N186 End stage renal disease: Secondary | ICD-10-CM | POA: Diagnosis not present

## 2015-08-06 SURGERY — ESOPHAGOGASTRODUODENOSCOPY (EGD) WITH PROPOFOL
Anesthesia: Monitor Anesthesia Care

## 2015-08-07 ENCOUNTER — Ambulatory Visit
Admission: RE | Admit: 2015-08-07 | Discharge: 2015-08-07 | Disposition: A | Discharge status: Home / Self Care | Payer: Medicare Other | Source: Ambulatory Visit | Attending: Vascular Surgery | Admitting: Vascular Surgery

## 2015-08-07 ENCOUNTER — Encounter: Payer: Self-pay | Admitting: Vascular Surgery

## 2015-08-07 ENCOUNTER — Encounter
Admission: RE | Disposition: A | Discharge status: Home / Self Care | Payer: Self-pay | Source: Ambulatory Visit | Attending: Vascular Surgery

## 2015-08-07 DIAGNOSIS — D631 Anemia in chronic kidney disease: Secondary | ICD-10-CM | POA: Diagnosis not present

## 2015-08-07 DIAGNOSIS — Z853 Personal history of malignant neoplasm of breast: Secondary | ICD-10-CM | POA: Diagnosis not present

## 2015-08-07 DIAGNOSIS — Z9889 Other specified postprocedural states: Secondary | ICD-10-CM | POA: Insufficient documentation

## 2015-08-07 DIAGNOSIS — T82858A Stenosis of vascular prosthetic devices, implants and grafts, initial encounter: Secondary | ICD-10-CM | POA: Diagnosis not present

## 2015-08-07 DIAGNOSIS — Y841 Kidney dialysis as the cause of abnormal reaction of the patient, or of later complication, without mention of misadventure at the time of the procedure: Secondary | ICD-10-CM | POA: Diagnosis not present

## 2015-08-07 DIAGNOSIS — K31819 Angiodysplasia of stomach and duodenum without bleeding: Secondary | ICD-10-CM | POA: Insufficient documentation

## 2015-08-07 DIAGNOSIS — I959 Hypotension, unspecified: Secondary | ICD-10-CM | POA: Diagnosis not present

## 2015-08-07 DIAGNOSIS — H5461 Unqualified visual loss, right eye, normal vision left eye: Secondary | ICD-10-CM | POA: Insufficient documentation

## 2015-08-07 DIAGNOSIS — H9193 Unspecified hearing loss, bilateral: Secondary | ICD-10-CM | POA: Insufficient documentation

## 2015-08-07 DIAGNOSIS — Z8249 Family history of ischemic heart disease and other diseases of the circulatory system: Secondary | ICD-10-CM | POA: Insufficient documentation

## 2015-08-07 DIAGNOSIS — Z882 Allergy status to sulfonamides status: Secondary | ICD-10-CM | POA: Diagnosis not present

## 2015-08-07 DIAGNOSIS — R011 Cardiac murmur, unspecified: Secondary | ICD-10-CM | POA: Insufficient documentation

## 2015-08-07 DIAGNOSIS — K219 Gastro-esophageal reflux disease without esophagitis: Secondary | ICD-10-CM | POA: Diagnosis not present

## 2015-08-07 DIAGNOSIS — Z992 Dependence on renal dialysis: Secondary | ICD-10-CM | POA: Diagnosis not present

## 2015-08-07 DIAGNOSIS — E119 Type 2 diabetes mellitus without complications: Secondary | ICD-10-CM | POA: Diagnosis not present

## 2015-08-07 DIAGNOSIS — Z800 Family history of malignant neoplasm of digestive organs: Secondary | ICD-10-CM | POA: Insufficient documentation

## 2015-08-07 DIAGNOSIS — Z87891 Personal history of nicotine dependence: Secondary | ICD-10-CM | POA: Insufficient documentation

## 2015-08-07 DIAGNOSIS — E785 Hyperlipidemia, unspecified: Secondary | ICD-10-CM | POA: Insufficient documentation

## 2015-08-07 DIAGNOSIS — M1990 Unspecified osteoarthritis, unspecified site: Secondary | ICD-10-CM | POA: Diagnosis not present

## 2015-08-07 DIAGNOSIS — Z833 Family history of diabetes mellitus: Secondary | ICD-10-CM | POA: Insufficient documentation

## 2015-08-07 DIAGNOSIS — M810 Age-related osteoporosis without current pathological fracture: Secondary | ICD-10-CM | POA: Diagnosis not present

## 2015-08-07 DIAGNOSIS — I2510 Atherosclerotic heart disease of native coronary artery without angina pectoris: Secondary | ICD-10-CM | POA: Diagnosis not present

## 2015-08-07 DIAGNOSIS — Y832 Surgical operation with anastomosis, bypass or graft as the cause of abnormal reaction of the patient, or of later complication, without mention of misadventure at the time of the procedure: Secondary | ICD-10-CM | POA: Insufficient documentation

## 2015-08-07 DIAGNOSIS — I120 Hypertensive chronic kidney disease with stage 5 chronic kidney disease or end stage renal disease: Secondary | ICD-10-CM | POA: Insufficient documentation

## 2015-08-07 DIAGNOSIS — R001 Bradycardia, unspecified: Secondary | ICD-10-CM | POA: Insufficient documentation

## 2015-08-07 DIAGNOSIS — T82318A Breakdown (mechanical) of other vascular grafts, initial encounter: Secondary | ICD-10-CM | POA: Diagnosis not present

## 2015-08-07 DIAGNOSIS — Z6841 Body Mass Index (BMI) 40.0 and over, adult: Secondary | ICD-10-CM | POA: Diagnosis not present

## 2015-08-07 DIAGNOSIS — N186 End stage renal disease: Secondary | ICD-10-CM | POA: Diagnosis not present

## 2015-08-07 DIAGNOSIS — I10 Essential (primary) hypertension: Secondary | ICD-10-CM | POA: Diagnosis not present

## 2015-08-07 DIAGNOSIS — E1122 Type 2 diabetes mellitus with diabetic chronic kidney disease: Secondary | ICD-10-CM | POA: Insufficient documentation

## 2015-08-07 HISTORY — PX: PERIPHERAL VASCULAR CATHETERIZATION: SHX172C

## 2015-08-07 LAB — POTASSIUM (ARMC VASCULAR LAB ONLY): Potassium (ARMC vascular lab): 4.2 (ref 3.5–5.1)

## 2015-08-07 SURGERY — A/V SHUNTOGRAM/FISTULAGRAM
Anesthesia: Moderate Sedation | Site: Arm Upper

## 2015-08-07 MED ORDER — MIDAZOLAM HCL 2 MG/2ML IJ SOLN
INTRAMUSCULAR | Status: DC | PRN
  Administered 2015-08-07: 3 mg via INTRAVENOUS

## 2015-08-07 MED ORDER — SODIUM CHLORIDE 0.9 % IV SOLN
INTRAVENOUS | Status: DC
  Administered 2015-08-07 (×2): via INTRAVENOUS

## 2015-08-07 MED ORDER — FENTANYL CITRATE (PF) 100 MCG/2ML IJ SOLN
INTRAMUSCULAR | Status: AC
  Filled 2015-08-07: qty 2

## 2015-08-07 MED ORDER — NALOXONE HCL 0.4 MG/ML IJ SOLN
INTRAMUSCULAR | Status: DC | PRN
  Administered 2015-08-07: 0.2 mg via INTRAVENOUS

## 2015-08-07 MED ORDER — NALOXONE HCL 2 MG/2ML IJ SOSY
PREFILLED_SYRINGE | INTRAMUSCULAR | Status: AC
  Filled 2015-08-07: qty 2

## 2015-08-07 MED ORDER — DEXTROSE 5 % IV SOLN
1.5000 g | INTRAVENOUS | Status: AC
  Administered 2015-08-07: 1.5 g via INTRAVENOUS

## 2015-08-07 MED ORDER — IOPAMIDOL (ISOVUE-300) INJECTION 61%
INTRAVENOUS | Status: DC | PRN
  Administered 2015-08-07: 15 mL via INTRA_ARTERIAL

## 2015-08-07 MED ORDER — FENTANYL CITRATE (PF) 100 MCG/2ML IJ SOLN
INTRAMUSCULAR | Status: DC | PRN
  Administered 2015-08-07: 100 ug via INTRAVENOUS

## 2015-08-07 MED ORDER — HEPARIN SODIUM (PORCINE) 1000 UNIT/ML IJ SOLN
INTRAMUSCULAR | Status: DC | PRN
  Administered 2015-08-07: 3000 [IU] via INTRAVENOUS

## 2015-08-07 MED ORDER — SODIUM CHLORIDE FLUSH 0.9 % IV SOLN
INTRAVENOUS | Status: AC
  Filled 2015-08-07: qty 50

## 2015-08-07 MED ORDER — FLUMAZENIL 0.5 MG/5ML IV SOLN
INTRAVENOUS | Status: DC | PRN
Start: 2015-08-07 — End: 2015-08-07
  Administered 2015-08-07: 0.5 mg via INTRAVENOUS

## 2015-08-07 MED ORDER — METHYLPREDNISOLONE SODIUM SUCC 125 MG IJ SOLR: 125.0000 mg | INTRAMUSCULAR | Status: DC | PRN

## 2015-08-07 MED ORDER — FLUMAZENIL 0.5 MG/5ML IV SOLN
INTRAVENOUS | Status: AC
  Filled 2015-08-07: qty 5

## 2015-08-07 MED ORDER — HYDROMORPHONE HCL 1 MG/ML IJ SOLN: 1.0000 mg | Freq: Once | INTRAMUSCULAR | Status: DC

## 2015-08-07 MED ORDER — ONDANSETRON HCL 4 MG/2ML IJ SOLN: 4.0000 mg | Freq: Four times a day (QID) | INTRAMUSCULAR | Status: DC | PRN

## 2015-08-07 MED ORDER — HEPARIN (PORCINE) IN NACL 2-0.9 UNIT/ML-% IJ SOLN
INTRAMUSCULAR | Status: AC
  Filled 2015-08-07: qty 1000

## 2015-08-07 MED ORDER — FAMOTIDINE 20 MG PO TABS: 40.0000 mg | ORAL_TABLET | ORAL | Status: DC | PRN

## 2015-08-07 MED ORDER — MIDAZOLAM HCL 5 MG/5ML IJ SOLN
INTRAMUSCULAR | Status: AC
  Filled 2015-08-07: qty 5

## 2015-08-07 MED ORDER — HEPARIN SODIUM (PORCINE) 1000 UNIT/ML IJ SOLN
INTRAMUSCULAR | Status: AC
  Filled 2015-08-07: qty 1

## 2015-08-07 MED ORDER — LIDOCAINE-EPINEPHRINE (PF) 1 %-1:200000 IJ SOLN
INTRAMUSCULAR | Status: AC
  Filled 2015-08-07: qty 30

## 2015-08-07 SURGICAL SUPPLY — 5 items
CANNULA 5F STIFF (CANNULA) ×4 IMPLANT
DRAPE BRACHIAL (DRAPES) ×4 IMPLANT
PACK ANGIOGRAPHY (CUSTOM PROCEDURE TRAY) ×4 IMPLANT
SHEATH BRITE TIP 6FRX5.5 (SHEATH) ×4 IMPLANT
TOWEL OR 17X26 4PK STRL BLUE (TOWEL DISPOSABLE) ×4 IMPLANT

## 2015-08-07 NOTE — Progress Notes (Signed)
Pt remains clinically stable , denies complaints at this time, vss, alert and oriented, sr per monitor with bp stabl at 111/42

## 2015-08-07 NOTE — Discharge Instructions (Signed)
Fistulogram, Care After °Refer to this sheet in the next few weeks. These instructions provide you with information on caring for yourself after your procedure. Your health care provider may also give you more specific instructions. Your treatment has been planned according to current medical practices, but problems sometimes occur. Call your health care provider if you have any problems or questions after your procedure. °WHAT TO EXPECT AFTER THE PROCEDURE °After your procedure, it is typical to have the following: °· A small amount of discomfort in the area where the catheters were placed. °· A small amount of bruising around the fistula. °· Sleepiness and fatigue. °HOME CARE INSTRUCTIONS °· Rest at home for the day following your procedure. °· Do not drive or operate heavy machinery while taking pain medicine. °· Take medicines only as directed by your health care provider. °· Do not take baths, swim, or use a hot tub until your health care provider approves. You may shower 24 hours after the procedure or as directed by your health care provider. °· There are many different ways to close and cover an incision, including stitches, skin glue, and adhesive strips. Follow your health care provider's instructions on: °¨ Incision care. °¨ Bandage (dressing) changes and removal. °¨ Incision closure removal. °· Monitor your dialysis fistula carefully. °SEEK MEDICAL CARE IF: °· You have drainage, redness, swelling, or pain at your catheter site. °· You have a fever. °· You have chills. °SEEK IMMEDIATE MEDICAL CARE IF: °· You feel weak. °· You have trouble balancing. °· You have trouble moving your arms or legs. °· You have problems with your speech or vision. °· You can no longer feel a vibration or buzz when you put your fingers over your dialysis fistula. °· The limb that was used for the procedure: °¨ Swells. °¨ Is painful. °¨ Is cold. °¨ Is discolored, such as blue or pale white. °  °This information is not intended  to replace advice given to you by your health care provider. Make sure you discuss any questions you have with your health care provider. °  °Document Released: 07/30/2013 Document Reviewed: 07/30/2013 °Elsevier Interactive Patient Education ©2016 Elsevier Inc. ° °

## 2015-08-07 NOTE — H&P (Signed)
[facility] Admission History & Physical  MRN : [id number]  [name] is a 82 y.o. ([birth year]) female who presents with chief complaint of No chief complaint on file. Marland Kitchen  History of Present Illness: Patient is sent from her dialysis access center for evaluation of her left arm AV fistula. This has required multiple previous interventions in the past. They have developed increasing pressures with diminished flow. She feels well without any other complaints. They have requested a fistulogram and we plan to perform this today.  Current Facility-Administered Medications  Medication Dose Route Frequency Provider Last Rate Last Dose  . 0.9 %  sodium chloride infusion   Intravenous Continuous Janalyn Harder Stegmayer, PA-C 10 mL/hr at 08/07/15 [id number]    . cefUROXime (ZINACEF) 1.5 g in dextrose 5 % 50 mL IVPB  1.5 g Intravenous 30 min Pre-Op Kimberly A Stegmayer, PA-C      . famotidine (PEPCID) tablet 40 mg  40 mg Oral PRN Janalyn Harder Stegmayer, PA-C      . HYDROmorphone (DILAUDID) injection 1 mg  1 mg Intravenous Once [employer], PA-C      . methylPREDNISolone sodium succinate (SOLU-MEDROL) 125 mg/2 mL injection 125 mg  125 mg Intravenous PRN Kavitha Nandigam V, MD      . ondansetron (ZOFRAN) injection 4 mg  4 mg Intravenous Q6H PRN Sela Hua, PA-C        Past Medical History  Diagnosis Date  . Hyperlipidemia   . Hypertension   . Facial droop 1935    acquired during forceps delivery, some residual R visual loss  . Hx of breast cancer 2004  . GERD (gastroesophageal reflux disease)   . Bradycardia 2014    a. Noted 06/2012.  Marland Kitchen Hypotension     a. Associated w/ dialysis.  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 [name])  . Osteoporosis 11/2013    DEXA -3.4 radius  . GAVE (gastric antral vascular ectasia) 08/2014    s/p EGD with APC  . Anemia in chronic kidney disease     IV iron infusion and anemia from GAVE blood loss.  . End  stage renal disease on dialysis [facility])     HD M,W,F [place] AVF now LUE AVF  . Hearing loss     Bilateral hearing aids  . Sight impaired     Left: wears contact. Right: diminished peripheral vision  . Cardiac murmur     a. thought due to AVF (2D echo 06/2012 without significant valvular disease).   Marland Kitchen CAD (coronary artery disease), native coronary artery     3 vessel CAD by cath 04/2015  . Arthritis      hands  . Breast cancer ([place])     Left Breast 13 yrs. ago  . History of blood transfusion 5 weeks ago  . Family history of adverse reaction to anesthesia     brother trouble waking up and confusion, now deceased    Past Surgical History  Procedure Laterality Date  . Cataract extraction Bilateral 1980  . Av fistula placement  07/20/10    Right brachiocephalic AVF  . Umbilical hernia repair  2011  . Dexa  2013    [facility]  . US echocardiography  06/2012    LVH, EF 65%, grade 1 diastol dysfunction, mod dliated LAD  . Eye surgery  1966    regular cataract  . Breast lumpectomy Left   . Breast  biopsy Left   . Bascilic vein transposition Left 07/24/2013    Procedure: BASILIC VEIN TRANSPOSITION;  Surgeon: [name], MD;  Location: [place];  Service: Vascular;  Laterality: Left;  . Shuntogram N/A 04/16/2011    Procedure: Earney Mallet;  Surgeon: [name], MD;  Location: [facility] CATH LAB;  Service: Cardiovascular;  Laterality: N/A;  . Shuntogram Right 07/05/2013    Procedure: FISTULOGRAM;  Surgeon: [name], MD;  Location: [facility] CATH LAB;  Service: Cardiovascular;  Laterality: Right;  . Esophagogastroduodenoscopy N/A 04/09/2014    Procedure: ESOPHAGOGASTRODUODENOSCOPY (EGD);  Surgeon: [name], MD;  Location: [place];  Service: Endoscopy;  Laterality: N/A;  . Peripheral vascular catheterization N/A 08/15/2014    Procedure: A/V Shuntogram/Fistulagram;  Surgeon: [name], MD;  Location: [place] CV LAB;  Service: Cardiovascular;  Laterality: N/A;  .  Esophagogastroduodenoscopy N/A 09/23/2014    Procedure: ESOPHAGOGASTRODUODENOSCOPY (EGD);  Surgeon: [name], MD;  Location: [facility];  Service: Endoscopy;  Laterality: N/A;  . Hernia repair    . Esophagogastroduodenoscopy (egd) with propofol N/A 11/12/2014    Procedure: ESOPHAGOGASTRODUODENOSCOPY (EGD) WITH PROPOFOL;  Surgeon: [name], MD;  Location: [facility];  Service: Endoscopy;  Laterality: N/A;  . Hot hemostasis N/A 11/12/2014    Procedure: HOT HEMOSTASIS (ARGON PLASMA COAGULATION/BICAP);  Surgeon: [name], MD;  Location: [facility];  Service: Endoscopy;  Laterality: N/A;  . Esophagogastroduodenoscopy N/A 12/26/2014    neg H pylori, benign biopsies; ESOPHAGOGASTRODUODENOSCOPY (EGD);  Surgeon: [name], MD  . Cardiac catheterization N/A 04/10/2015    Procedure: Left Heart Cath and Coronary Angiography;  Surgeon: [name], MD;  Location: [place] CV LAB;  Service: Cardiovascular;  Laterality: N/A;  . Peripheral vascular catheterization N/A 04/28/2015    Procedure: A/V Shuntogram/Fistulagram;  Surgeon: [name], MD;  Location: [place] CV LAB;  Service: Cardiovascular;  Laterality: N/A;  . Peripheral vascular catheterization Left 04/28/2015    Procedure: A/V Shunt Intervention;  Surgeon: [name], MD;  Location: [place] CV LAB;  Service: Cardiovascular;  Laterality: Left;  . Esophagogastroduodenoscopy N/A 06/15/2015    Procedure: ESOPHAGOGASTRODUODENOSCOPY (EGD);  Surgeon: Milus Banister, MD;  Location: [place];  Service: Endoscopy;  Laterality: N/A;  . Esophagogastroduodenoscopy N/A 06/27/2015    Procedure: ESOPHAGOGASTRODUODENOSCOPY (EGD);  Surgeon: Mauri Pole, MD;  Location: [facility];  Service: Endoscopy;  Laterality: N/A;  . Hot hemostasis N/A 06/27/2015    Procedure: HOT HEMOSTASIS (ARGON PLASMA COAGULATION/BICAP);  Surgeon: Mauri Pole, MD;  Location: [facility];  Service: Endoscopy;  Laterality: N/A;  .  Esophagogastroduodenoscopy (egd) with propofol N/A 07/31/2015    Procedure: ESOPHAGOGASTRODUODENOSCOPY (EGD) WITH PROPOFOL;  Surgeon: Mauri Pole, MD;  Location: [facility];  Service: Endoscopy;  Laterality: N/A;  . Hot hemostasis N/A 07/31/2015    Procedure: HOT HEMOSTASIS (ARGON PLASMA COAGULATION/BICAP);  Surgeon: Mauri Pole, MD;  Location: [facility];  Service: Endoscopy;  Laterality: N/A;    Social History Social History  Substance Use Topics  . Smoking status: Former Smoker -- 2 years    Types: Cigarettes    Quit date: 03/29/1953  . Smokeless tobacco: Never Used  . Alcohol Use: 0.0 oz/week    0 Standard drinks or equivalent per week     Comment: occasional  No IV drug use  Family History Family History  Problem Relation Age of Onset  . Heart disease Brother     Congenital  . Hypertension Brother   .  Diabetes Brother   . Cancer Father     stomach  . CAD Father     MI at age 6  . Heart disease Father   . Heart attack Father   . Cancer Sister     female (uterus?)  . Hypertension Mother     Allergies  Allergen Reactions  . Sulfa Antibiotics Rash     REVIEW OF SYSTEMS (Negative unless checked)  Constitutional: [] Weight loss  [] Fever  [] Chills Cardiac: [] Chest pain   [] Chest pressure   [] Palpitations   [] Shortness of breath when laying flat   [] Shortness of breath at rest   [] Shortness of breath with exertion. Vascular:  [] Pain in legs with walking   [] Pain in legs at rest   [] Pain in legs when laying flat   [] Claudication   [] Pain in feet when walking  [] Pain in feet at rest  [] Pain in feet when laying flat   [] History of DVT   [] Phlebitis   [] Swelling in legs   [] Varicose veins   [] Non-healing ulcers Pulmonary:   [] Uses home oxygen   [] Productive cough   [] Hemoptysis   [] Wheeze  [] COPD   [] Asthma Neurologic:  [] Dizziness  [] Blackouts   [] Seizures   [] History of stroke   [] History of TIA  [] Aphasia   [] Temporary blindness   [] Dysphagia   [] Weakness or  numbness in arms   [] Weakness or numbness in legs Musculoskeletal:  [] Arthritis   [] Joint swelling   [] Joint pain   [] Low back pain Hematologic:  [] Easy bruising  [] Easy bleeding   [] Hypercoagulable state   [] Anemic  [] Hepatitis Gastrointestinal:  [] Blood in stool   [] Vomiting blood  [] Gastroesophageal reflux/heartburn   [] Difficulty swallowing. Genitourinary:  [x] Chronic kidney disease   [] Difficult urination  [] Frequent urination  [] Burning with urination   [] Blood in urine Skin:  [] Rashes   [] Ulcers   [] Wounds Psychological:  [] History of anxiety   []  History of major depression.  Physical Examination  Filed Vitals:   08/07/15 0852  BP: 164/64  Pulse: 68  Temp: 97.7 F (36.5 C)  TempSrc: Oral  Resp: 20  Height: 4\' 11"  (1.499 m)  Weight: 51.71 kg (114 lb)  SpO2: 98%   Body mass index is 23.01 kg/(m^2). Gen: WD/WN, NAD Head: [place]/AT, No temporalis wasting. Prominent Facial droop noted Ear/Nose/Throat: Hearing grossly intact, nares w/o erythema or drainage, oropharynx w/o Erythema/Exudate,  Eyes: PERRLA, EOMI.  Neck: Supple, no nuchal rigidity.  No bruit or JVD.  Pulmonary:  Good air movement, clear to auscultation bilaterally, no use of accessory muscles.  Cardiac: RRR, normal S1, S2, no Murmurs, rubs or gallops. Vascular: Weak thrill with a more pulsatile nature of the AV fistula Vessel Right Left  Radial Palpable Palpable                                   Gastrointestinal: soft, non-tender/non-distended. No guarding/reflex.  Musculoskeletal: M/S 5/5 throughout.  Extremities without ischemic changes.  No deformity or atrophy.  Neurologic: CN 2-12 intact. Pain and light touch intact in extremities.  Symmetrical.  Speech is fluent. Motor exam as listed above. Psychiatric: Judgment intact, Mood & affect appropriate for pt's clinical situation. Dermatologic: No rashes or ulcers noted.  No cellulitis or open wounds. Lymph : No Cervical, Axillary, or Inguinal  lymphadenopathy.     CBC Lab Results  Component Value Date   WBC 5.9 06/19/2015   HGB 12.6 06/27/2015   HCT  37.0 06/27/2015   MCV 91.3 06/19/2015   PLT 242 06/19/2015    BMET    Component Value Date/Time   NA 142 06/27/2015 1311   NA 136 10/23/2013 1504   NA 142 07/12/2012   K 3.5 06/27/2015 1311   K 3.7 11/07/2013 1055   K 5.1 07/12/2012   CL 98* 06/19/2015 0949   CL 98 10/23/2013 1504   CO2 29 06/19/2015 0949   CO2 30 10/23/2013 1504   GLUCOSE 86 06/27/2015 1311   GLUCOSE 231* 10/23/2013 1504   BUN 49* 06/19/2015 0949   BUN 49* 10/23/2013 1504   BUN 58* 07/12/2012   CREATININE 5.81* 06/19/2015 0949   CREATININE 5.95* 10/23/2013 1504   CREATININE 6.9 02/14/2013   CALCIUM 8.9 06/19/2015 0949   CALCIUM 9.0 10/23/2013 1504   GFRNONAA 6* 06/19/2015 0949   GFRNONAA 6* 10/23/2013 1504   GFRAA 7* 06/19/2015 0949   GFRAA 7* 10/23/2013 1504   CrCl cannot be calculated (Patient has no serum creatinine result on file.).  COAG Lab Results  Component Value Date   INR 1.04 06/19/2015   INR 1.00 04/09/2015   INR 1.02 09/22/2014    Radiology No results found.    Assessment/Plan 1. Dysfunction of dialysis access. Therefore fistulogram today. Risks and benefits discussed. 2. End-stage renal disease. Needs better dialysis access. 3. Diabetes. Stable and outpatient medications. 4. Hypertension. Stable and outpatient medications.   DEW,JASON, MD  08/07/2015 10:31 AM

## 2015-08-07 NOTE — H&P (Signed)
  [facility] History & Physical Update  The patient was interviewed and re-examined.  The patient's previous History and Physical has been reviewed and is unchanged.  There is no change in the plan of care. We plan to proceed with the scheduled procedure.  Carel Schnee, MD  08/07/2015, 8:45 AM

## 2015-08-07 NOTE — Progress Notes (Signed)
Pt post procedure clinically stable, awake, alert and oriented, required reversal meds in vascular, thus will need to be here in recovery for 2hours per policy, family friend present and aware, Dr Lucky Cowboy out to speak with pt with questions answered, sr per monitor,

## 2015-08-07 NOTE — Op Note (Signed)
[facility]    OPERATIVE NOTE   PROCEDURE: 1.   Left brachiocephalic arteriovenous fistula cannulation under ultrasound guidance 2.   Left arm fistulagram including central venogram  PRE-OPERATIVE DIAGNOSIS: 1. ESRD 2. Poorly functional left brachiocephalic AVF  POST-OPERATIVE DIAGNOSIS: same as above   SURGEON: Leotis Pain, MD  ANESTHESIA: local with MCS  ESTIMATED BLOOD LOSS: Minimal   FINDING(S): 1. 20-30% stenosis in the proximal upper arm cephalic vein without any flow limitation. With compression of the AV fistula the arterial anastomosis was evaluated and there was a mild perianastomotic stenosis in the 30-40% range. This did not appear flow limiting either.  SPECIMEN(S):  None  CONTRAST: 15 cc  FLUORO TIME: Less than 1 minutes  MODERATE CONSCIOUS SEDATION TIME: Approximately 20 minutes with 3 mg of Versed and 100 mcg of Fentanyl, reversed with Narcan and Romazicon   INDICATIONS: [name] is a 82 y.o. female who presents with malfunctioning  left brachiocephalic arteriovenous fistula.  The patient is scheduled for  left arm fistulagram at the request of her dialysis access center.  The patient is aware the risks include but are not limited to: bleeding, infection, thrombosis of the cannulated access, and possible anaphylactic reaction to the contrast.  The patient is aware of the risks of the procedure and elects to proceed forward.  DESCRIPTION: After full informed written consent was obtained, the patient was brought back to the angiography suite and placed supine upon the angiography table.  The patient was connected to monitoring equipment. Moderate conscious sedation was administered with a face to face encounter with the patient throughout the procedure with my supervision of the RN administering medicines and monitoring the patient's vital signs and mental status throughout from the start of the procedure until the patient was taken to the  recovery room. The  left arm was prepped and draped in the standard fashion for a percutaneous access intervention.  Under ultrasound guidance, the  initial portion of the left brachiocephalic arteriovenous fistula was cannulated with a micropuncture needle under direct ultrasound guidance and a permanent image was performed.  The microwire was advanced into the fistula and the needle was exchanged for the a microsheath.  I then upsized to a 6 Fr Sheath and imaging was performed.  Hand injections were completed to image the access including the central venous system. This demonstrated 20-30% stenosis in the proximal upper arm cephalic vein without any flow limitation. With compression of the AV fistula the arterial anastomosis was evaluated and there was a mild perianastomotic stenosis in the 30-40% range. This did not appear flow limiting either..  Based on the completion imaging, no further intervention is necessary.  The wire and balloon were removed from the sheath.  A 4-0 Monocryl purse-string suture was sewn around the sheath.  The sheath was removed while tying down the suture.  A sterile bandage was applied to the puncture site.  COMPLICATIONS: None  CONDITION: Stable   Nolyn Swab  08/07/2015 11:15 AM

## 2015-08-08 DIAGNOSIS — N186 End stage renal disease: Secondary | ICD-10-CM | POA: Diagnosis not present

## 2015-08-08 DIAGNOSIS — N2581 Secondary hyperparathyroidism of renal origin: Secondary | ICD-10-CM | POA: Diagnosis not present

## 2015-08-08 DIAGNOSIS — D631 Anemia in chronic kidney disease: Secondary | ICD-10-CM | POA: Diagnosis not present

## 2015-08-11 ENCOUNTER — Ambulatory Visit: Payer: Medicare Other | Admitting: Gastroenterology

## 2015-08-11 ENCOUNTER — Telehealth: Payer: Self-pay | Admitting: Gastroenterology

## 2015-08-11 DIAGNOSIS — D631 Anemia in chronic kidney disease: Secondary | ICD-10-CM | POA: Diagnosis not present

## 2015-08-11 DIAGNOSIS — N186 End stage renal disease: Secondary | ICD-10-CM | POA: Diagnosis not present

## 2015-08-11 DIAGNOSIS — N2581 Secondary hyperparathyroidism of renal origin: Secondary | ICD-10-CM | POA: Diagnosis not present

## 2015-08-11 NOTE — Telephone Encounter (Signed)
ok 

## 2015-08-13 DIAGNOSIS — N2581 Secondary hyperparathyroidism of renal origin: Secondary | ICD-10-CM | POA: Diagnosis not present

## 2015-08-13 DIAGNOSIS — D631 Anemia in chronic kidney disease: Secondary | ICD-10-CM | POA: Diagnosis not present

## 2015-08-13 DIAGNOSIS — N186 End stage renal disease: Secondary | ICD-10-CM | POA: Diagnosis not present

## 2015-08-15 DIAGNOSIS — N186 End stage renal disease: Secondary | ICD-10-CM | POA: Diagnosis not present

## 2015-08-15 DIAGNOSIS — N2581 Secondary hyperparathyroidism of renal origin: Secondary | ICD-10-CM | POA: Diagnosis not present

## 2015-08-15 DIAGNOSIS — D631 Anemia in chronic kidney disease: Secondary | ICD-10-CM | POA: Diagnosis not present

## 2015-08-18 DIAGNOSIS — D631 Anemia in chronic kidney disease: Secondary | ICD-10-CM | POA: Diagnosis not present

## 2015-08-18 DIAGNOSIS — N186 End stage renal disease: Secondary | ICD-10-CM | POA: Diagnosis not present

## 2015-08-18 DIAGNOSIS — N2581 Secondary hyperparathyroidism of renal origin: Secondary | ICD-10-CM | POA: Diagnosis not present

## 2015-08-20 DIAGNOSIS — M5136 Other intervertebral disc degeneration, lumbar region: Secondary | ICD-10-CM | POA: Diagnosis not present

## 2015-08-20 DIAGNOSIS — N186 End stage renal disease: Secondary | ICD-10-CM | POA: Diagnosis not present

## 2015-08-20 DIAGNOSIS — M9905 Segmental and somatic dysfunction of pelvic region: Secondary | ICD-10-CM | POA: Diagnosis not present

## 2015-08-20 DIAGNOSIS — N2581 Secondary hyperparathyroidism of renal origin: Secondary | ICD-10-CM | POA: Diagnosis not present

## 2015-08-20 DIAGNOSIS — M955 Acquired deformity of pelvis: Secondary | ICD-10-CM | POA: Diagnosis not present

## 2015-08-20 DIAGNOSIS — D631 Anemia in chronic kidney disease: Secondary | ICD-10-CM | POA: Diagnosis not present

## 2015-08-20 DIAGNOSIS — M9903 Segmental and somatic dysfunction of lumbar region: Secondary | ICD-10-CM | POA: Diagnosis not present

## 2015-08-22 DIAGNOSIS — N2581 Secondary hyperparathyroidism of renal origin: Secondary | ICD-10-CM | POA: Diagnosis not present

## 2015-08-22 DIAGNOSIS — D631 Anemia in chronic kidney disease: Secondary | ICD-10-CM | POA: Diagnosis not present

## 2015-08-22 DIAGNOSIS — N186 End stage renal disease: Secondary | ICD-10-CM | POA: Diagnosis not present

## 2015-08-25 DIAGNOSIS — D631 Anemia in chronic kidney disease: Secondary | ICD-10-CM | POA: Diagnosis not present

## 2015-08-25 DIAGNOSIS — N186 End stage renal disease: Secondary | ICD-10-CM | POA: Diagnosis not present

## 2015-08-25 DIAGNOSIS — N2581 Secondary hyperparathyroidism of renal origin: Secondary | ICD-10-CM | POA: Diagnosis not present

## 2015-08-27 DIAGNOSIS — E1129 Type 2 diabetes mellitus with other diabetic kidney complication: Secondary | ICD-10-CM | POA: Diagnosis not present

## 2015-08-27 DIAGNOSIS — Z992 Dependence on renal dialysis: Secondary | ICD-10-CM | POA: Diagnosis not present

## 2015-08-27 DIAGNOSIS — N186 End stage renal disease: Secondary | ICD-10-CM | POA: Diagnosis not present

## 2015-08-27 DIAGNOSIS — D631 Anemia in chronic kidney disease: Secondary | ICD-10-CM | POA: Diagnosis not present

## 2015-08-27 DIAGNOSIS — N2581 Secondary hyperparathyroidism of renal origin: Secondary | ICD-10-CM | POA: Diagnosis not present

## 2015-08-29 DIAGNOSIS — N186 End stage renal disease: Secondary | ICD-10-CM | POA: Diagnosis not present

## 2015-08-29 DIAGNOSIS — N2581 Secondary hyperparathyroidism of renal origin: Secondary | ICD-10-CM | POA: Diagnosis not present

## 2015-08-29 DIAGNOSIS — D631 Anemia in chronic kidney disease: Secondary | ICD-10-CM | POA: Diagnosis not present

## 2015-08-29 DIAGNOSIS — E1129 Type 2 diabetes mellitus with other diabetic kidney complication: Secondary | ICD-10-CM | POA: Diagnosis not present

## 2015-09-01 DIAGNOSIS — E1129 Type 2 diabetes mellitus with other diabetic kidney complication: Secondary | ICD-10-CM | POA: Diagnosis not present

## 2015-09-01 DIAGNOSIS — D631 Anemia in chronic kidney disease: Secondary | ICD-10-CM | POA: Diagnosis not present

## 2015-09-01 DIAGNOSIS — N2581 Secondary hyperparathyroidism of renal origin: Secondary | ICD-10-CM | POA: Diagnosis not present

## 2015-09-01 DIAGNOSIS — N186 End stage renal disease: Secondary | ICD-10-CM | POA: Diagnosis not present

## 2015-09-02 DIAGNOSIS — T82858A Stenosis of vascular prosthetic devices, implants and grafts, initial encounter: Secondary | ICD-10-CM | POA: Diagnosis not present

## 2015-09-02 DIAGNOSIS — E119 Type 2 diabetes mellitus without complications: Secondary | ICD-10-CM | POA: Diagnosis not present

## 2015-09-02 DIAGNOSIS — N186 End stage renal disease: Secondary | ICD-10-CM | POA: Diagnosis not present

## 2015-09-02 DIAGNOSIS — E785 Hyperlipidemia, unspecified: Secondary | ICD-10-CM | POA: Diagnosis not present

## 2015-09-02 DIAGNOSIS — Z992 Dependence on renal dialysis: Secondary | ICD-10-CM | POA: Diagnosis not present

## 2015-09-02 DIAGNOSIS — Y841 Kidney dialysis as the cause of abnormal reaction of the patient, or of later complication, without mention of misadventure at the time of the procedure: Secondary | ICD-10-CM | POA: Diagnosis not present

## 2015-09-02 DIAGNOSIS — T82318A Breakdown (mechanical) of other vascular grafts, initial encounter: Secondary | ICD-10-CM | POA: Diagnosis not present

## 2015-09-02 DIAGNOSIS — I10 Essential (primary) hypertension: Secondary | ICD-10-CM | POA: Diagnosis not present

## 2015-09-03 DIAGNOSIS — N2581 Secondary hyperparathyroidism of renal origin: Secondary | ICD-10-CM | POA: Diagnosis not present

## 2015-09-03 DIAGNOSIS — N186 End stage renal disease: Secondary | ICD-10-CM | POA: Diagnosis not present

## 2015-09-03 DIAGNOSIS — E1129 Type 2 diabetes mellitus with other diabetic kidney complication: Secondary | ICD-10-CM | POA: Diagnosis not present

## 2015-09-03 DIAGNOSIS — D631 Anemia in chronic kidney disease: Secondary | ICD-10-CM | POA: Diagnosis not present

## 2015-09-05 DIAGNOSIS — N186 End stage renal disease: Secondary | ICD-10-CM | POA: Diagnosis not present

## 2015-09-05 DIAGNOSIS — D631 Anemia in chronic kidney disease: Secondary | ICD-10-CM | POA: Diagnosis not present

## 2015-09-05 DIAGNOSIS — E1129 Type 2 diabetes mellitus with other diabetic kidney complication: Secondary | ICD-10-CM | POA: Diagnosis not present

## 2015-09-05 DIAGNOSIS — N2581 Secondary hyperparathyroidism of renal origin: Secondary | ICD-10-CM | POA: Diagnosis not present

## 2015-09-08 DIAGNOSIS — N186 End stage renal disease: Secondary | ICD-10-CM | POA: Diagnosis not present

## 2015-09-08 DIAGNOSIS — E1129 Type 2 diabetes mellitus with other diabetic kidney complication: Secondary | ICD-10-CM | POA: Diagnosis not present

## 2015-09-08 DIAGNOSIS — N2581 Secondary hyperparathyroidism of renal origin: Secondary | ICD-10-CM | POA: Diagnosis not present

## 2015-09-08 DIAGNOSIS — D631 Anemia in chronic kidney disease: Secondary | ICD-10-CM | POA: Diagnosis not present

## 2015-09-10 DIAGNOSIS — N2581 Secondary hyperparathyroidism of renal origin: Secondary | ICD-10-CM | POA: Diagnosis not present

## 2015-09-10 DIAGNOSIS — N186 End stage renal disease: Secondary | ICD-10-CM | POA: Diagnosis not present

## 2015-09-10 DIAGNOSIS — D631 Anemia in chronic kidney disease: Secondary | ICD-10-CM | POA: Diagnosis not present

## 2015-09-10 DIAGNOSIS — E1129 Type 2 diabetes mellitus with other diabetic kidney complication: Secondary | ICD-10-CM | POA: Diagnosis not present

## 2015-09-12 DIAGNOSIS — D631 Anemia in chronic kidney disease: Secondary | ICD-10-CM | POA: Diagnosis not present

## 2015-09-12 DIAGNOSIS — E1129 Type 2 diabetes mellitus with other diabetic kidney complication: Secondary | ICD-10-CM | POA: Diagnosis not present

## 2015-09-12 DIAGNOSIS — N2581 Secondary hyperparathyroidism of renal origin: Secondary | ICD-10-CM | POA: Diagnosis not present

## 2015-09-12 DIAGNOSIS — N186 End stage renal disease: Secondary | ICD-10-CM | POA: Diagnosis not present

## 2015-09-15 DIAGNOSIS — E1129 Type 2 diabetes mellitus with other diabetic kidney complication: Secondary | ICD-10-CM | POA: Diagnosis not present

## 2015-09-15 DIAGNOSIS — N2581 Secondary hyperparathyroidism of renal origin: Secondary | ICD-10-CM | POA: Diagnosis not present

## 2015-09-15 DIAGNOSIS — D631 Anemia in chronic kidney disease: Secondary | ICD-10-CM | POA: Diagnosis not present

## 2015-09-15 DIAGNOSIS — N186 End stage renal disease: Secondary | ICD-10-CM | POA: Diagnosis not present

## 2015-09-17 DIAGNOSIS — M9905 Segmental and somatic dysfunction of pelvic region: Secondary | ICD-10-CM | POA: Diagnosis not present

## 2015-09-17 DIAGNOSIS — N186 End stage renal disease: Secondary | ICD-10-CM | POA: Diagnosis not present

## 2015-09-17 DIAGNOSIS — N2581 Secondary hyperparathyroidism of renal origin: Secondary | ICD-10-CM | POA: Diagnosis not present

## 2015-09-17 DIAGNOSIS — D631 Anemia in chronic kidney disease: Secondary | ICD-10-CM | POA: Diagnosis not present

## 2015-09-17 DIAGNOSIS — M9903 Segmental and somatic dysfunction of lumbar region: Secondary | ICD-10-CM | POA: Diagnosis not present

## 2015-09-17 DIAGNOSIS — E1129 Type 2 diabetes mellitus with other diabetic kidney complication: Secondary | ICD-10-CM | POA: Diagnosis not present

## 2015-09-17 DIAGNOSIS — M5136 Other intervertebral disc degeneration, lumbar region: Secondary | ICD-10-CM | POA: Diagnosis not present

## 2015-09-17 DIAGNOSIS — M955 Acquired deformity of pelvis: Secondary | ICD-10-CM | POA: Diagnosis not present

## 2015-09-18 NOTE — Progress Notes (Signed)
HPI: FU bradycardia and CAD. She is dialysis dependent. Holter monitor in April of 2014 showed sinus rhythm to sinus bradycardia with PACs and PVCs. Nuclear study repeated September 2016. Ejection fraction 47%. There was an anterior lateral perfusion defect consistent with prior infarct and peri-infarct ischemia. Cardiac catheterization January 2017 showed a 95% distal right coronary lesion, 70% proximal to mid, 85% acute marginal, 99% ramus, 80% circumflex, 85% first marginal, 75% second diagonal and 80% mid LAD. PCI was felt to be high risk. Dr Roxan Hockey saw pt and felt she would be very high risk for surgery; she elected medical therapy. Carotid Dopplers February 2017 showed 1-39% bilateral stenosis. Echocardiogram March 2017 showed normal LV function, Moderate mitral regurgitation, mild to moderate left atrial enlargement. Patient was transfused with improvement in hemoglobin. Since last seen the patient has dyspnea with more extreme activities but not with routine activities. It is relieved with rest. It is not associated with chest pain. There is no orthopnea, PND or pedal edema. There is no syncope or palpitations. There is no exertional chest pain.   Current Outpatient Prescriptions  Medication Sig Dispense Refill  . acetaminophen (TYLENOL) 500 MG tablet Take 1,000 mg by mouth every 6 (six) hours as needed for moderate pain.    Marland Kitchen amLODipine (NORVASC) 2.5 MG tablet Take 2.5 mg by mouth at bedtime.    . Artificial Tear Ointment (REFRESH P.M. OP) Place 1 application into the right eye daily.    Marland Kitchen aspirin EC 81 MG tablet Take 1 tablet (81 mg total) by mouth daily. 90 tablet 3  . atorvastatin (LIPITOR) 80 MG tablet Take 1 tablet (80 mg total) by mouth daily. 90 tablet 3  . B Complex-C-Folic Acid (RENA-VITE PO) Take 1 tablet by mouth daily with breakfast.     . Calcium Carbonate Antacid (TUMS ULTRA 1000 PO) Take 3,000 mg by mouth 3 (three) times daily with meals.     . cholecalciferol  (VITAMIN D) 1000 UNITS tablet Take 3,000 Units by mouth every Monday, Wednesday, and Friday.     . fexofenadine (ALLEGRA) 180 MG tablet Take 180 mg by mouth 2 (two) times daily.    . metoprolol tartrate (LOPRESSOR) 25 MG tablet Take 0.5 tablets (12.5 mg total) by mouth 2 (two) times daily. 60 tablet 11  . pantoprazole (PROTONIX) 40 MG tablet Take 1 tablet (40 mg total) by mouth 2 (two) times daily. (Patient taking differently: Take 80 mg by mouth daily. ) 60 tablet 5  . PRESCRIPTION MEDICATION CHL Anemia Therapy     No current facility-administered medications for this visit.     Past Medical History  Diagnosis Date  . Hyperlipidemia   . Hypertension   . Facial droop 1935    acquired during forceps delivery, some residual R visual loss  . Hx of breast cancer 2004  . GERD (gastroesophageal reflux disease)   . Bradycardia 2014    a. Noted 06/2012.  Marland Kitchen Hypotension     a. Associated w/ dialysis.  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 [name])  . Osteoporosis 11/2013    DEXA -3.4 radius  . GAVE (gastric antral vascular ectasia) 08/2014    s/p EGD with APC  . Anemia in chronic kidney disease     IV iron infusion and anemia from GAVE blood loss.  . End stage renal disease on dialysis [facility])     HD M,W,F [place] AVF now LUE AVF  .  Hearing loss     Bilateral hearing aids  . Sight impaired     Left: wears contact. Right: diminished peripheral vision  . Cardiac murmur     a. thought due to AVF (2D echo 06/2012 without significant valvular disease).   Marland Kitchen CAD (coronary artery disease), native coronary artery     3 vessel CAD by cath 04/2015  . Arthritis      hands  . Breast cancer ([place])     Left Breast 13 yrs. ago  . History of blood transfusion 5 weeks ago  . Family history of adverse reaction to anesthesia     brother trouble waking up and confusion, now deceased    Past Surgical History  Procedure Laterality Date  . Cataract extraction  Bilateral 1980  . Av fistula placement  07/20/10    Right brachiocephalic AVF  . Umbilical hernia repair  2011  . Dexa  2013    [facility]  . US echocardiography  06/2012    LVH, EF 65%, grade 1 diastol dysfunction, mod dliated LAD  . Eye surgery  1966    regular cataract  . Breast lumpectomy Left   . Breast biopsy Left   . Bascilic vein transposition Left 07/24/2013    Procedure: BASILIC VEIN TRANSPOSITION;  Surgeon: [name], MD;  Location: [place];  Service: Vascular;  Laterality: Left;  . Shuntogram N/A 04/16/2011    Procedure: Earney Mallet;  Surgeon: [name], MD;  Location: [facility] CATH LAB;  Service: Cardiovascular;  Laterality: N/A;  . Shuntogram Right 07/05/2013    Procedure: FISTULOGRAM;  Surgeon: [name], MD;  Location: [facility] CATH LAB;  Service: Cardiovascular;  Laterality: Right;  . Esophagogastroduodenoscopy N/A 04/09/2014    Procedure: ESOPHAGOGASTRODUODENOSCOPY (EGD);  Surgeon: [name], MD;  Location: [facility];  Service: Endoscopy;  Laterality: N/A;  . Peripheral vascular catheterization N/A 08/15/2014    Procedure: A/V Shuntogram/Fistulagram;  Surgeon: [name], MD;  Location: [place] CV LAB;  Service: Cardiovascular;  Laterality: N/A;  . Esophagogastroduodenoscopy N/A 09/23/2014    Procedure: ESOPHAGOGASTRODUODENOSCOPY (EGD);  Surgeon: [name], MD;  Location: [place];  Service: Endoscopy;  Laterality: N/A;  . Hernia repair    . Esophagogastroduodenoscopy (egd) with propofol N/A 11/12/2014    Procedure: ESOPHAGOGASTRODUODENOSCOPY (EGD) WITH PROPOFOL;  Surgeon: [name], MD;  [facility];  Service: Endoscopy;  Laterality: N/A;  . Hot hemostasis N/A 11/12/2014    Procedure: HOT HEMOSTASIS (ARGON PLASMA COAGULATION/BICAP);  Surgeon: [name], MD;  Location[facility];  Service: Endoscopy;  Laterality: N/A;  . Esophagogastroduodenoscopy N/A 12/26/2014    neg H pylori, benign biopsies; ESOPHAGOGASTRODUODENOSCOPY  (EGD);  Surgeon: [name], MD  . Cardiac catheterization N/A 04/10/2015    Procedure: Left Heart Cath and Coronary Angiography;  Surgeon: [name], MD;  Location: [place] CV LAB;  Service: Cardiovascular;  Laterality: N/A;  . Peripheral vascular catheterization N/A 04/28/2015    Procedure: A/V Shuntogram/Fistulagram;  Surgeon: [name], MD;  Location: [place] CV LAB;  Service: Cardiovascular;  Laterality: N/A;  . Peripheral vascular catheterization Left 04/28/2015    Procedure: A/V Shunt Intervention;  Surgeon: [name], MD;  Location: [place] CV LAB;  Service: Cardiovascular;  Laterality: Left;  . Esophagogastroduodenoscopy N/A 06/15/2015    Procedure: ESOPHAGOGASTRODUODENOSCOPY (EGD);  Surgeon: Milus Banister, MD;  Location: [place];  Service: Endoscopy;  Laterality: N/A;  . Esophagogastroduodenoscopy N/A 06/27/2015    Procedure: ESOPHAGOGASTRODUODENOSCOPY (EGD);  Surgeon: Mauri Pole, MD;  Location[facility];  Service: Endoscopy;  Laterality: N/A;  . Hot hemostasis N/A 06/27/2015    Procedure: HOT HEMOSTASIS (ARGON PLASMA COAGULATION/BICAP);  Surgeon: Mauri Pole, MD;  Location[facility];  Service: Endoscopy;  Laterality: N/A;  . Esophagogastroduodenoscopy (egd) with propofol N/A 07/31/2015    Procedure: ESOPHAGOGASTRODUODENOSCOPY (EGD) WITH PROPOFOL;  Surgeon: Mauri Pole, MD;  [facility];  Service: Endoscopy;  Laterality: N/A;  . Hot hemostasis N/A 07/31/2015    Procedure: HOT HEMOSTASIS (ARGON PLASMA COAGULATION/BICAP);  Surgeon: Mauri Pole, MD;  Location[facility];  Service: Endoscopy;  Laterality: N/A;  . Peripheral vascular catheterization Left 08/07/2015    Procedure: A/V Shuntogram/Fistulagram;  Surgeon: [name], MD;  Location: [place] CV LAB;  Service: Cardiovascular;  Laterality: Left;  . Peripheral vascular catheterization N/A 08/07/2015    Procedure: A/V Shunt Intervention;  Surgeon: [name], MD;  Location: [place] CV LAB;  Service: Cardiovascular;  Laterality: N/A;    Social History   Social History  . Marital Status: Single    Spouse Name: N/A  . Number of Children: N/A  . Years of Education: N/A   Occupational History  . 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 Smoking status: Former Smoker -- 2 years    Types: Cigarettes    Quit date: 03/29/1953  . Smokeless tobacco: Never Used  . Alcohol Use: 0.0 oz/week    0 Standard drinks or equivalent per week     Comment: occasional  . Drug Use: No  . Sexual Activity: Not on file   Other Topics Concern  . Not on file   Social History Narrative   As of 03/2013. Lives alone with her small dog.  Bother and sister in law died last year.     Occupation: retired, was Information systems manager for [employer]   Edu: some college    Family History  Problem Relation Age of Onset  . Heart disease Brother     Congenital  . Hypertension Brother   . Diabetes Brother   . Cancer Father     stomach  . CAD Father     MI at age 69  . Heart disease Father   . Heart attack Father   . Cancer Sister     female (uterus?)  . Hypertension Mother     ROS: no fevers or chills, productive cough, hemoptysis, dysphasia, odynophagia, melena, hematochezia, dysuria, hematuria, rash, seizure activity, orthopnea, PND, pedal edema, claudication. Remaining systems are negative.  Physical Exam: Well-developed well-nourished in no acute distress.  Skin is warm and dry.  HEENT is right facial droop Neck is supple.  Chest is clear to auscultation with normal expansion.  Cardiovascular exam is regular rate and rhythm.  Abdominal exam nontender or distended. No masses palpated. Extremities show no edema. neuro grossly intact   Assessment and plan  1 coronary artery disease-patient has severe three-vessel coronary disease. She has been felt to be high risk for  coronary artery bypass graft or PCI. She prefers medical therapy as well. Plan to continue aspirin, statin and beta blockade. She does have ischemia when she develops significant anemia. She will need to be transfused as needed by primary care. 2 anemia-hemoglobin is followed by nephrology. 3 end-stage renal disease-dialysis per nephrology. I will not advance antianginals as she does have occasions of low blood pressure  postdialysis. 4 hyperlipidemia-continue statin. 5 hypertension-continue present blood pressure medications.  Kirk Ruths, MD

## 2015-09-19 DIAGNOSIS — N186 End stage renal disease: Secondary | ICD-10-CM | POA: Diagnosis not present

## 2015-09-19 DIAGNOSIS — E1129 Type 2 diabetes mellitus with other diabetic kidney complication: Secondary | ICD-10-CM | POA: Diagnosis not present

## 2015-09-19 DIAGNOSIS — D631 Anemia in chronic kidney disease: Secondary | ICD-10-CM | POA: Diagnosis not present

## 2015-09-19 DIAGNOSIS — N2581 Secondary hyperparathyroidism of renal origin: Secondary | ICD-10-CM | POA: Diagnosis not present

## 2015-09-22 DIAGNOSIS — E1129 Type 2 diabetes mellitus with other diabetic kidney complication: Secondary | ICD-10-CM | POA: Diagnosis not present

## 2015-09-22 DIAGNOSIS — N186 End stage renal disease: Secondary | ICD-10-CM | POA: Diagnosis not present

## 2015-09-22 DIAGNOSIS — D631 Anemia in chronic kidney disease: Secondary | ICD-10-CM | POA: Diagnosis not present

## 2015-09-22 DIAGNOSIS — N2581 Secondary hyperparathyroidism of renal origin: Secondary | ICD-10-CM | POA: Diagnosis not present

## 2015-09-24 DIAGNOSIS — D631 Anemia in chronic kidney disease: Secondary | ICD-10-CM | POA: Diagnosis not present

## 2015-09-24 DIAGNOSIS — N186 End stage renal disease: Secondary | ICD-10-CM | POA: Diagnosis not present

## 2015-09-24 DIAGNOSIS — N2581 Secondary hyperparathyroidism of renal origin: Secondary | ICD-10-CM | POA: Diagnosis not present

## 2015-09-24 DIAGNOSIS — E1129 Type 2 diabetes mellitus with other diabetic kidney complication: Secondary | ICD-10-CM | POA: Diagnosis not present

## 2015-09-25 ENCOUNTER — Encounter: Payer: Self-pay | Admitting: Cardiology

## 2015-09-25 ENCOUNTER — Ambulatory Visit (INDEPENDENT_AMBULATORY_CARE_PROVIDER_SITE_OTHER): Payer: Medicare Other | Admitting: Cardiology

## 2015-09-25 VITALS — BP 146/64 | HR 72 | Ht 59.0 in | Wt 116.0 lb

## 2015-09-25 DIAGNOSIS — N186 End stage renal disease: Secondary | ICD-10-CM | POA: Diagnosis not present

## 2015-09-25 DIAGNOSIS — I2510 Atherosclerotic heart disease of native coronary artery without angina pectoris: Secondary | ICD-10-CM | POA: Diagnosis not present

## 2015-09-25 DIAGNOSIS — I10 Essential (primary) hypertension: Secondary | ICD-10-CM

## 2015-09-25 DIAGNOSIS — E785 Hyperlipidemia, unspecified: Secondary | ICD-10-CM | POA: Diagnosis not present

## 2015-09-25 DIAGNOSIS — Z992 Dependence on renal dialysis: Secondary | ICD-10-CM

## 2015-09-25 MED ORDER — METOPROLOL TARTRATE 25 MG PO TABS: 12.5000 mg | ORAL_TABLET | Freq: Two times a day (BID) | ORAL | Status: DC

## 2015-09-25 NOTE — Patient Instructions (Signed)
Your physician wants you to follow-up in: 6 MONTHS WITH DR CRENSHAW You will receive a reminder letter in the mail two months in advance. If you don't receive a letter, please call our office to schedule the follow-up appointment.   If you need a refill on your cardiac medications before your next appointment, please call your pharmacy.  

## 2015-09-26 DIAGNOSIS — Z992 Dependence on renal dialysis: Secondary | ICD-10-CM | POA: Diagnosis not present

## 2015-09-26 DIAGNOSIS — D631 Anemia in chronic kidney disease: Secondary | ICD-10-CM | POA: Diagnosis not present

## 2015-09-26 DIAGNOSIS — N2581 Secondary hyperparathyroidism of renal origin: Secondary | ICD-10-CM | POA: Diagnosis not present

## 2015-09-26 DIAGNOSIS — E1129 Type 2 diabetes mellitus with other diabetic kidney complication: Secondary | ICD-10-CM | POA: Diagnosis not present

## 2015-09-26 DIAGNOSIS — N186 End stage renal disease: Secondary | ICD-10-CM | POA: Diagnosis not present

## 2015-09-29 DIAGNOSIS — Z23 Encounter for immunization: Secondary | ICD-10-CM | POA: Diagnosis not present

## 2015-09-29 DIAGNOSIS — E1129 Type 2 diabetes mellitus with other diabetic kidney complication: Secondary | ICD-10-CM | POA: Diagnosis not present

## 2015-09-29 DIAGNOSIS — N186 End stage renal disease: Secondary | ICD-10-CM | POA: Diagnosis not present

## 2015-09-29 DIAGNOSIS — D631 Anemia in chronic kidney disease: Secondary | ICD-10-CM | POA: Diagnosis not present

## 2015-09-29 DIAGNOSIS — N2581 Secondary hyperparathyroidism of renal origin: Secondary | ICD-10-CM | POA: Diagnosis not present

## 2015-10-01 DIAGNOSIS — Z23 Encounter for immunization: Secondary | ICD-10-CM | POA: Diagnosis not present

## 2015-10-01 DIAGNOSIS — N2581 Secondary hyperparathyroidism of renal origin: Secondary | ICD-10-CM | POA: Diagnosis not present

## 2015-10-01 DIAGNOSIS — E1129 Type 2 diabetes mellitus with other diabetic kidney complication: Secondary | ICD-10-CM | POA: Diagnosis not present

## 2015-10-01 DIAGNOSIS — D631 Anemia in chronic kidney disease: Secondary | ICD-10-CM | POA: Diagnosis not present

## 2015-10-01 DIAGNOSIS — N186 End stage renal disease: Secondary | ICD-10-CM | POA: Diagnosis not present

## 2015-10-03 DIAGNOSIS — E1129 Type 2 diabetes mellitus with other diabetic kidney complication: Secondary | ICD-10-CM | POA: Diagnosis not present

## 2015-10-03 DIAGNOSIS — D631 Anemia in chronic kidney disease: Secondary | ICD-10-CM | POA: Diagnosis not present

## 2015-10-03 DIAGNOSIS — Z23 Encounter for immunization: Secondary | ICD-10-CM | POA: Diagnosis not present

## 2015-10-03 DIAGNOSIS — N186 End stage renal disease: Secondary | ICD-10-CM | POA: Diagnosis not present

## 2015-10-03 DIAGNOSIS — N2581 Secondary hyperparathyroidism of renal origin: Secondary | ICD-10-CM | POA: Diagnosis not present

## 2015-10-06 DIAGNOSIS — Z23 Encounter for immunization: Secondary | ICD-10-CM | POA: Diagnosis not present

## 2015-10-06 DIAGNOSIS — D631 Anemia in chronic kidney disease: Secondary | ICD-10-CM | POA: Diagnosis not present

## 2015-10-06 DIAGNOSIS — N2581 Secondary hyperparathyroidism of renal origin: Secondary | ICD-10-CM | POA: Diagnosis not present

## 2015-10-06 DIAGNOSIS — E1129 Type 2 diabetes mellitus with other diabetic kidney complication: Secondary | ICD-10-CM | POA: Diagnosis not present

## 2015-10-06 DIAGNOSIS — N186 End stage renal disease: Secondary | ICD-10-CM | POA: Diagnosis not present

## 2015-10-08 DIAGNOSIS — N2581 Secondary hyperparathyroidism of renal origin: Secondary | ICD-10-CM | POA: Diagnosis not present

## 2015-10-08 DIAGNOSIS — N186 End stage renal disease: Secondary | ICD-10-CM | POA: Diagnosis not present

## 2015-10-08 DIAGNOSIS — Z23 Encounter for immunization: Secondary | ICD-10-CM | POA: Diagnosis not present

## 2015-10-08 DIAGNOSIS — E1129 Type 2 diabetes mellitus with other diabetic kidney complication: Secondary | ICD-10-CM | POA: Diagnosis not present

## 2015-10-08 DIAGNOSIS — D631 Anemia in chronic kidney disease: Secondary | ICD-10-CM | POA: Diagnosis not present

## 2015-10-10 DIAGNOSIS — D631 Anemia in chronic kidney disease: Secondary | ICD-10-CM | POA: Diagnosis not present

## 2015-10-10 DIAGNOSIS — N2581 Secondary hyperparathyroidism of renal origin: Secondary | ICD-10-CM | POA: Diagnosis not present

## 2015-10-10 DIAGNOSIS — E1129 Type 2 diabetes mellitus with other diabetic kidney complication: Secondary | ICD-10-CM | POA: Diagnosis not present

## 2015-10-10 DIAGNOSIS — Z23 Encounter for immunization: Secondary | ICD-10-CM | POA: Diagnosis not present

## 2015-10-10 DIAGNOSIS — N186 End stage renal disease: Secondary | ICD-10-CM | POA: Diagnosis not present

## 2015-10-13 DIAGNOSIS — N186 End stage renal disease: Secondary | ICD-10-CM | POA: Diagnosis not present

## 2015-10-13 DIAGNOSIS — D631 Anemia in chronic kidney disease: Secondary | ICD-10-CM | POA: Diagnosis not present

## 2015-10-13 DIAGNOSIS — E1129 Type 2 diabetes mellitus with other diabetic kidney complication: Secondary | ICD-10-CM | POA: Diagnosis not present

## 2015-10-13 DIAGNOSIS — N2581 Secondary hyperparathyroidism of renal origin: Secondary | ICD-10-CM | POA: Diagnosis not present

## 2015-10-13 DIAGNOSIS — Z23 Encounter for immunization: Secondary | ICD-10-CM | POA: Diagnosis not present

## 2015-10-14 ENCOUNTER — Ambulatory Visit (INDEPENDENT_AMBULATORY_CARE_PROVIDER_SITE_OTHER): Payer: Medicare Other | Admitting: Gastroenterology

## 2015-10-14 ENCOUNTER — Encounter: Payer: Self-pay | Admitting: Gastroenterology

## 2015-10-14 VITALS — BP 114/50 | HR 68 | Ht <= 58 in | Wt 117.2 lb

## 2015-10-14 DIAGNOSIS — I2510 Atherosclerotic heart disease of native coronary artery without angina pectoris: Secondary | ICD-10-CM

## 2015-10-14 DIAGNOSIS — K31819 Angiodysplasia of stomach and duodenum without bleeding: Secondary | ICD-10-CM | POA: Diagnosis not present

## 2015-10-14 DIAGNOSIS — D500 Iron deficiency anemia secondary to blood loss (chronic): Secondary | ICD-10-CM | POA: Diagnosis not present

## 2015-10-14 NOTE — Patient Instructions (Signed)
Follow up as needed

## 2015-10-14 NOTE — Progress Notes (Signed)
[name]    [id number]    [birth year]  Primary Care Physician:Javier Danise Mina, MD  Referring Physician: [name], MD [address] [place], [place] [address]  Chief complaint: Anemia  HPI: 82 year old white female with history of Gave status post multiple prior EGDs with APC here for follow-up visit. Most recent EGD in May 2017 showed significant improvement in gastric antral vascular ectasia, and had fewer residual lesions that were treated with APC. Hemoglobin stable between 9-10 . She does stay on Protonix 40 mg daily and is on a baby aspirin. No regular NSAIDs. Overall she feels her symptoms are stable and has no specific GI complaint Denies any nausea, vomiting, abdominal pain, melena or bright red blood per rectum   Outpatient Encounter Prescriptions as of 10/14/2015  Medication Sig  . acetaminophen (TYLENOL) 500 MG tablet Take 1,000 mg by mouth every 6 (six) hours as needed for moderate pain.  Marland Kitchen amLODipine (NORVASC) 2.5 MG tablet Take 2.5 mg by mouth at bedtime.  . Artificial Tear Ointment (REFRESH P.M. OP) Place 1 application into the right eye daily.  Marland Kitchen aspirin EC 81 MG tablet Take 1 tablet (81 mg total) by mouth daily.  Marland Kitchen atorvastatin (LIPITOR) 80 MG tablet Take 1 tablet (80 mg total) by mouth daily.  . B Complex-C-Folic Acid (RENA-VITE PO) Take 1 tablet by mouth daily with breakfast.   . Calcium Carbonate Antacid (TUMS ULTRA 1000 PO) Take 3,000 mg by mouth 3 (three) times daily with meals.   . cholecalciferol (VITAMIN D) 1000 UNITS tablet Take 3,000 Units by mouth every Monday, Wednesday, and Friday.   . fexofenadine (ALLEGRA) 180 MG tablet Take 180 mg by mouth 2 (two) times daily.  . metoprolol tartrate (LOPRESSOR) 25 MG tablet Take 0.5 tablets (12.5 mg total) by mouth 2 (two) times daily.  . pantoprazole (PROTONIX) 40 MG tablet Take 1 tablet (40 mg total) by mouth 2 (two) times daily. (Patient taking differently: Take 80 mg by mouth daily. )    . PRESCRIPTION MEDICATION CHL Anemia Therapy   No facility-administered encounter medications on file as of 10/14/2015.    Allergies as of 10/14/2015 - Review Complete 10/14/2015  Allergen Reaction Noted  . Sulfa antibiotics Rash 12/14/2010    Past Medical History  Diagnosis Date  . Hyperlipidemia   . Hypertension   . Facial droop 1935    acquired during forceps delivery, some residual R visual loss  . Hx of breast cancer 2004  . GERD (gastroesophageal reflux disease)   . Bradycardia 2014    a. Noted 06/2012.  Marland Kitchen Hypotension     a. Associated w/ dialysis.  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 [name])  . Osteoporosis 11/2013    DEXA -3.4 radius  . GAVE (gastric antral vascular ectasia) 08/2014    s/p EGD with APC  . Anemia in chronic kidney disease     IV iron infusion and anemia from GAVE blood loss.  . End stage renal disease on dialysis [facility])     HD M,W,F [place] AVF now LUE AVF  . Hearing loss     Bilateral hearing aids  . Sight impaired     Left: wears contact. Right: diminished peripheral vision  . Cardiac murmur     a. thought due to AVF (2D echo 06/2012 without significant valvular disease).   Marland Kitchen CAD (coronary artery disease), native coronary artery  3 vessel CAD by cath 04/2015  . Arthritis      hands  . Breast cancer ([place])     Left Breast 13 yrs. ago  . History of blood transfusion 5 weeks ago  . Family history of adverse reaction to anesthesia     brother trouble waking up and confusion, now deceased    Past Surgical History  Procedure Laterality Date  . Cataract extraction Bilateral 1980  . Av fistula placement  07/20/10    Right brachiocephalic AVF  . Umbilical hernia repair  2011  . Dexa  2013    [facility]  . US echocardiography  06/2012    LVH, EF 65%, grade 1 diastol dysfunction, mod dliated LAD  . Eye surgery  1966    regular cataract  . Breast lumpectomy Left   . Breast biopsy Left   . Bascilic vein  transposition Left 07/24/2013    Procedure: BASILIC VEIN TRANSPOSITION;  Surgeon: [name], MD;  Location: [place];  Service: Vascular;  Laterality: Left;  . Shuntogram N/A 04/16/2011    Procedure: Earney Mallet;  Surgeon: [name], MD;  Location: [facility] CATH LAB;  Service: Cardiovascular;  Laterality: N/A;  . Shuntogram Right 07/05/2013    Procedure: FISTULOGRAM;  Surgeon: [name], MD;  Location: [facility] CATH LAB;  Service: Cardiovascular;  Laterality: Right;  . Esophagogastroduodenoscopy N/A 04/09/2014    Procedure: ESOPHAGOGASTRODUODENOSCOPY (EGD);  Surgeon: [name], MD;  Location: [place];  Service: Endoscopy;  Laterality: N/A;  . Peripheral vascular catheterization N/A 08/15/2014    Procedure: A/V Shuntogram/Fistulagram;  Surgeon: [name], MD;  Location: [place] CV LAB;  Service: Cardiovascular;  Laterality: N/A;  . Esophagogastroduodenoscopy N/A 09/23/2014    Procedure: ESOPHAGOGASTRODUODENOSCOPY (EGD);  Surgeon: [name], MD;  Location: [place];  Service: Endoscopy;  Laterality: N/A;  . Hernia repair    . Esophagogastroduodenoscopy (egd) with propofol N/A 11/12/2014    Procedure: ESOPHAGOGASTRODUODENOSCOPY (EGD) WITH PROPOFOL;  Surgeon: [name], MD;  [facility];  Service: Endoscopy;  Laterality: N/A;  . Hot hemostasis N/A 11/12/2014    Procedure: HOT HEMOSTASIS (ARGON PLASMA COAGULATION/BICAP);  Surgeon: [name], MD;  Location[facility];  Service: Endoscopy;  Laterality: N/A;  . Esophagogastroduodenoscopy N/A 12/26/2014    neg H pylori, benign biopsies; ESOPHAGOGASTRODUODENOSCOPY (EGD);  Surgeon: [name], MD  . Cardiac catheterization N/A 04/10/2015    Procedure: Left Heart Cath and Coronary Angiography;  Surgeon: [name], MD;  Location: [place] CV LAB;  Service: Cardiovascular;  Laterality: N/A;  . Peripheral vascular catheterization N/A 04/28/2015    Procedure: A/V Shuntogram/Fistulagram;  Surgeon:  [name], MD;  Location: [place] CV LAB;  Service: Cardiovascular;  Laterality: N/A;  . Peripheral vascular catheterization Left 04/28/2015    Procedure: A/V Shunt Intervention;  Surgeon: [name], MD;  Location: [place] CV LAB;  Service: Cardiovascular;  Laterality: Left;  . Esophagogastroduodenoscopy N/A 06/15/2015    Procedure: ESOPHAGOGASTRODUODENOSCOPY (EGD);  Surgeon: Milus Banister, MD;  Location: [place];  Service: Endoscopy;  Laterality: N/A;  . Esophagogastroduodenoscopy N/A 06/27/2015    Procedure: ESOPHAGOGASTRODUODENOSCOPY (EGD);  Surgeon: Mauri Pole, MD;  Location[facility];  Service: Endoscopy;  Laterality: N/A;  . Hot hemostasis N/A 06/27/2015    Procedure: HOT HEMOSTASIS (ARGON PLASMA COAGULATION/BICAP);  Surgeon: Mauri Pole, MD;  Location[facility];  Service: Endoscopy;  Laterality: N/A;  . Esophagogastroduodenoscopy (egd) with propofol N/A 07/31/2015  Procedure: ESOPHAGOGASTRODUODENOSCOPY (EGD) WITH PROPOFOL;  Surgeon: Mauri Pole, MD;  [facility];  Service: Endoscopy;  Laterality: N/A;  . Hot hemostasis N/A 07/31/2015    Procedure: HOT HEMOSTASIS (ARGON PLASMA COAGULATION/BICAP);  Surgeon: Mauri Pole, MD;  Location[facility];  Service: Endoscopy;  Laterality: N/A;  . Peripheral vascular catheterization Left 08/07/2015    Procedure: A/V Shuntogram/Fistulagram;  Surgeon: [name], MD;  Location: [place] CV LAB;  Service: Cardiovascular;  Laterality: Left;  . Peripheral vascular catheterization N/A 08/07/2015    Procedure: A/V Shunt Intervention;  Surgeon: [name], MD;  Location: [place] CV LAB;  Service: Cardiovascular;  Laterality: N/A;    Family History  Problem Relation Age of Onset  . Heart disease Brother     Congenital  . Hypertension Brother   . Diabetes Brother   . Cancer Father     stomach  . CAD Father     MI at age 50  . Heart disease Father   . Heart attack Father   .  Cancer Sister     female (uterus?)  . Hypertension Mother     Social History   Social History  . Marital Status: Single    Spouse Name: N/A  . Number of Children: N/A  . Years of Education: N/A   Occupational History  . 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 Smoking status: Former Smoker -- 2 years    Types: Cigarettes    Quit date: 03/29/1953  . Smokeless tobacco: Never Used  . Alcohol Use: 0.0 oz/week    0 Standard drinks or equivalent per week     Comment: occasional  . Drug Use: No  . Sexual Activity: Not on file   Other Topics Concern  . Not on file   Social History Narrative   As of 03/2013. Lives alone with her small dog.  Bother and sister in law died last year.     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  HENT: Negative.   Eyes: Negative for blurred vision.  Respiratory: Negative for cough, shortness of breath and wheezing.   Cardiovascular: Negative for chest pain and palpitations.  Gastrointestinal: as per HPI Genitourinary: Negative for dysuria, urgency, frequency and hematuria.  Musculoskeletal: Negative for myalgias, back pain and joint pain.  Skin: Negative for itching and rash.  Neurological: Negative for dizziness, tremors, focal weakness, seizures and loss of consciousness.  Psychiatric/Behavioral: Negative for depression, suicidal ideas and hallucinations.  All other systems reviewed and are negative.   Physical Exam: Filed Vitals:   10/14/15 1344  BP: 114/50  Pulse: 68   Gen:      No acute distress HEENT:  , sclera anicteric, facial asymmetry with missing right eye Neck:     No masses; no thyromegaly Lungs:    Clear to auscultation bilaterally; normal respiratory effort CV:         Regular rate and rhythm; no murmurs Abd:      +  bowel sounds; soft, non-tender; no palpable masses, no distension Ext:    No edema; adequate peripheral perfusion, AV fistula Skin:      Warm and dry; no rash Neuro: alert and oriented x 3 Psych: normal mood and affect  Data Reviewed:  Reviewed chart in epic   Assessment and Plan/Recommendations:  82 year old female with end-stage renal disease on hemodialysis, chronic anemia, gastric antral vascular ectasia status post multiple EGD with APC here for follow-up visit Patient's hemoglobin has been stable in the past few months We'll schedule for surveillance EGD in 6 months Continue to monitor serial CBC, she will notify us with any significant drop in hemoglobin or if she develops signs of overt GI bleed (melena or blood per rectum) Return as needed   Greater than 50% of the time used for counseling as well as treatment plan and follow-up.   Damaris Hippo , MD [phone] Mon-Fri 8a-5p [phone] after 5p, weekends, holidays  CC: [name], MD

## 2015-10-15 DIAGNOSIS — N186 End stage renal disease: Secondary | ICD-10-CM | POA: Diagnosis not present

## 2015-10-15 DIAGNOSIS — Z23 Encounter for immunization: Secondary | ICD-10-CM | POA: Diagnosis not present

## 2015-10-15 DIAGNOSIS — D631 Anemia in chronic kidney disease: Secondary | ICD-10-CM | POA: Diagnosis not present

## 2015-10-15 DIAGNOSIS — E1129 Type 2 diabetes mellitus with other diabetic kidney complication: Secondary | ICD-10-CM | POA: Diagnosis not present

## 2015-10-15 DIAGNOSIS — N2581 Secondary hyperparathyroidism of renal origin: Secondary | ICD-10-CM | POA: Diagnosis not present

## 2015-10-17 DIAGNOSIS — E1129 Type 2 diabetes mellitus with other diabetic kidney complication: Secondary | ICD-10-CM | POA: Diagnosis not present

## 2015-10-17 DIAGNOSIS — Z23 Encounter for immunization: Secondary | ICD-10-CM | POA: Diagnosis not present

## 2015-10-17 DIAGNOSIS — D631 Anemia in chronic kidney disease: Secondary | ICD-10-CM | POA: Diagnosis not present

## 2015-10-17 DIAGNOSIS — N186 End stage renal disease: Secondary | ICD-10-CM | POA: Diagnosis not present

## 2015-10-17 DIAGNOSIS — N2581 Secondary hyperparathyroidism of renal origin: Secondary | ICD-10-CM | POA: Diagnosis not present

## 2015-10-20 DIAGNOSIS — N2581 Secondary hyperparathyroidism of renal origin: Secondary | ICD-10-CM | POA: Diagnosis not present

## 2015-10-20 DIAGNOSIS — E1129 Type 2 diabetes mellitus with other diabetic kidney complication: Secondary | ICD-10-CM | POA: Diagnosis not present

## 2015-10-20 DIAGNOSIS — D631 Anemia in chronic kidney disease: Secondary | ICD-10-CM | POA: Diagnosis not present

## 2015-10-20 DIAGNOSIS — Z23 Encounter for immunization: Secondary | ICD-10-CM | POA: Diagnosis not present

## 2015-10-20 DIAGNOSIS — N186 End stage renal disease: Secondary | ICD-10-CM | POA: Diagnosis not present

## 2015-10-22 DIAGNOSIS — H2703 Aphakia, bilateral: Secondary | ICD-10-CM | POA: Diagnosis not present

## 2015-10-22 DIAGNOSIS — N186 End stage renal disease: Secondary | ICD-10-CM | POA: Diagnosis not present

## 2015-10-22 DIAGNOSIS — E1129 Type 2 diabetes mellitus with other diabetic kidney complication: Secondary | ICD-10-CM | POA: Diagnosis not present

## 2015-10-22 DIAGNOSIS — Z23 Encounter for immunization: Secondary | ICD-10-CM | POA: Diagnosis not present

## 2015-10-22 DIAGNOSIS — N2581 Secondary hyperparathyroidism of renal origin: Secondary | ICD-10-CM | POA: Diagnosis not present

## 2015-10-22 DIAGNOSIS — D631 Anemia in chronic kidney disease: Secondary | ICD-10-CM | POA: Diagnosis not present

## 2015-10-24 DIAGNOSIS — N186 End stage renal disease: Secondary | ICD-10-CM | POA: Diagnosis not present

## 2015-10-24 DIAGNOSIS — Z23 Encounter for immunization: Secondary | ICD-10-CM | POA: Diagnosis not present

## 2015-10-24 DIAGNOSIS — D631 Anemia in chronic kidney disease: Secondary | ICD-10-CM | POA: Diagnosis not present

## 2015-10-24 DIAGNOSIS — N2581 Secondary hyperparathyroidism of renal origin: Secondary | ICD-10-CM | POA: Diagnosis not present

## 2015-10-24 DIAGNOSIS — E1129 Type 2 diabetes mellitus with other diabetic kidney complication: Secondary | ICD-10-CM | POA: Diagnosis not present

## 2015-10-27 DIAGNOSIS — D631 Anemia in chronic kidney disease: Secondary | ICD-10-CM | POA: Diagnosis not present

## 2015-10-27 DIAGNOSIS — N186 End stage renal disease: Secondary | ICD-10-CM | POA: Diagnosis not present

## 2015-10-27 DIAGNOSIS — Z23 Encounter for immunization: Secondary | ICD-10-CM | POA: Diagnosis not present

## 2015-10-27 DIAGNOSIS — N2581 Secondary hyperparathyroidism of renal origin: Secondary | ICD-10-CM | POA: Diagnosis not present

## 2015-10-27 DIAGNOSIS — Z992 Dependence on renal dialysis: Secondary | ICD-10-CM | POA: Diagnosis not present

## 2015-10-27 DIAGNOSIS — E1129 Type 2 diabetes mellitus with other diabetic kidney complication: Secondary | ICD-10-CM | POA: Diagnosis not present

## 2015-10-28 DIAGNOSIS — E785 Hyperlipidemia, unspecified: Secondary | ICD-10-CM | POA: Diagnosis not present

## 2015-10-28 DIAGNOSIS — T82318A Breakdown (mechanical) of other vascular grafts, initial encounter: Secondary | ICD-10-CM | POA: Diagnosis not present

## 2015-10-28 DIAGNOSIS — E119 Type 2 diabetes mellitus without complications: Secondary | ICD-10-CM | POA: Diagnosis not present

## 2015-10-28 DIAGNOSIS — Z992 Dependence on renal dialysis: Secondary | ICD-10-CM | POA: Diagnosis not present

## 2015-10-28 DIAGNOSIS — T82858A Stenosis of vascular prosthetic devices, implants and grafts, initial encounter: Secondary | ICD-10-CM | POA: Diagnosis not present

## 2015-10-28 DIAGNOSIS — I10 Essential (primary) hypertension: Secondary | ICD-10-CM | POA: Diagnosis not present

## 2015-10-28 DIAGNOSIS — Y841 Kidney dialysis as the cause of abnormal reaction of the patient, or of later complication, without mention of misadventure at the time of the procedure: Secondary | ICD-10-CM | POA: Diagnosis not present

## 2015-10-28 DIAGNOSIS — N186 End stage renal disease: Secondary | ICD-10-CM | POA: Diagnosis not present

## 2015-10-29 ENCOUNTER — Other Ambulatory Visit: Payer: Self-pay | Admitting: Family Medicine

## 2015-10-29 DIAGNOSIS — N189 Chronic kidney disease, unspecified: Secondary | ICD-10-CM

## 2015-10-29 DIAGNOSIS — N2581 Secondary hyperparathyroidism of renal origin: Secondary | ICD-10-CM | POA: Diagnosis not present

## 2015-10-29 DIAGNOSIS — D631 Anemia in chronic kidney disease: Secondary | ICD-10-CM | POA: Diagnosis not present

## 2015-10-29 DIAGNOSIS — D509 Iron deficiency anemia, unspecified: Secondary | ICD-10-CM | POA: Diagnosis not present

## 2015-10-29 DIAGNOSIS — N186 End stage renal disease: Secondary | ICD-10-CM | POA: Diagnosis not present

## 2015-10-29 DIAGNOSIS — E1129 Type 2 diabetes mellitus with other diabetic kidney complication: Secondary | ICD-10-CM | POA: Diagnosis not present

## 2015-10-29 DIAGNOSIS — Z992 Dependence on renal dialysis: Principal | ICD-10-CM

## 2015-10-30 ENCOUNTER — Telehealth: Payer: Self-pay | Admitting: Radiology

## 2015-10-30 ENCOUNTER — Other Ambulatory Visit: Payer: Self-pay | Admitting: Vascular Surgery

## 2015-10-30 ENCOUNTER — Other Ambulatory Visit (INDEPENDENT_AMBULATORY_CARE_PROVIDER_SITE_OTHER): Payer: Medicare Other

## 2015-10-30 DIAGNOSIS — N189 Chronic kidney disease, unspecified: Secondary | ICD-10-CM

## 2015-10-30 DIAGNOSIS — N186 End stage renal disease: Secondary | ICD-10-CM | POA: Diagnosis not present

## 2015-10-30 DIAGNOSIS — Z992 Dependence on renal dialysis: Secondary | ICD-10-CM | POA: Diagnosis not present

## 2015-10-30 DIAGNOSIS — D631 Anemia in chronic kidney disease: Secondary | ICD-10-CM | POA: Diagnosis not present

## 2015-10-30 LAB — CBC WITH DIFFERENTIAL/PLATELET
BASOS PCT: 0.5 % (ref 0.0–3.0)
Basophils Absolute: 0 10*3/uL (ref 0.0–0.1)
EOS ABS: 0.3 10*3/uL (ref 0.0–0.7)
EOS PCT: 5.1 % — AB (ref 0.0–5.0)
HCT: 34.3 % — ABNORMAL LOW (ref 36.0–46.0)
HEMOGLOBIN: 10.8 g/dL — AB (ref 12.0–15.0)
LYMPHS ABS: 1.4 10*3/uL (ref 0.7–4.0)
Lymphocytes Relative: 24.4 % (ref 12.0–46.0)
MCHC: 31.5 g/dL (ref 30.0–36.0)
MCV: 88.7 fl (ref 78.0–100.0)
MONO ABS: 0.9 10*3/uL (ref 0.1–1.0)
Monocytes Relative: 14.7 % — ABNORMAL HIGH (ref 3.0–12.0)
NEUTROS PCT: 55.3 % (ref 43.0–77.0)
Neutro Abs: 3.3 10*3/uL (ref 1.4–7.7)
PLATELETS: 245 10*3/uL (ref 150.0–400.0)
RBC: 3.86 Mil/uL — ABNORMAL LOW (ref 3.87–5.11)
RDW: 19.8 % — AB (ref 11.5–15.5)
WBC: 5.9 10*3/uL (ref 4.0–10.5)

## 2015-10-30 LAB — RENAL FUNCTION PANEL
ALBUMIN: 3.8 g/dL (ref 3.5–5.2)
BUN: 43 mg/dL — AB (ref 6–23)
CHLORIDE: 97 meq/L (ref 96–112)
CO2: 30 mEq/L (ref 19–32)
CREATININE: 6.47 mg/dL — AB (ref 0.40–1.20)
Calcium: 9.4 mg/dL (ref 8.4–10.5)
GFR: 6.54 mL/min — CL (ref >60.00)
GLUCOSE: 278 mg/dL — AB (ref 70–99)
PHOSPHORUS: 3.3 mg/dL (ref 2.3–4.6)
Potassium: 4 mEq/L (ref 3.5–5.1)
SODIUM: 139 meq/L (ref 135–145)

## 2015-10-30 LAB — VITAMIN D 25 HYDROXY (VIT D DEFICIENCY, FRACTURES): VITD: 45.82 ng/mL (ref 30.00–100.00)

## 2015-10-30 NOTE — Telephone Encounter (Signed)
Noted. Known ESRD.  °

## 2015-10-30 NOTE — Telephone Encounter (Signed)
[facility] lab called critical results, Creatinine, 6.47, GFR 6.54.Results given to Dr Danise Mina

## 2015-10-31 DIAGNOSIS — D631 Anemia in chronic kidney disease: Secondary | ICD-10-CM | POA: Diagnosis not present

## 2015-10-31 DIAGNOSIS — N2581 Secondary hyperparathyroidism of renal origin: Secondary | ICD-10-CM | POA: Diagnosis not present

## 2015-10-31 DIAGNOSIS — E1129 Type 2 diabetes mellitus with other diabetic kidney complication: Secondary | ICD-10-CM | POA: Diagnosis not present

## 2015-10-31 DIAGNOSIS — D509 Iron deficiency anemia, unspecified: Secondary | ICD-10-CM | POA: Diagnosis not present

## 2015-10-31 DIAGNOSIS — N186 End stage renal disease: Secondary | ICD-10-CM | POA: Diagnosis not present

## 2015-10-31 LAB — PARATHYROID HORMONE, INTACT (NO CA): PTH: 49 pg/mL (ref 14–64)

## 2015-11-03 ENCOUNTER — Ambulatory Visit
Admission: RE | Admit: 2015-11-03 | Discharge: 2015-11-03 | Disposition: A | Discharge status: Home / Self Care | Payer: Medicare Other | Source: Ambulatory Visit | Attending: Vascular Surgery | Admitting: Vascular Surgery

## 2015-11-03 ENCOUNTER — Encounter
Admission: RE | Disposition: A | Discharge status: Home / Self Care | Payer: Self-pay | Source: Ambulatory Visit | Attending: Vascular Surgery

## 2015-11-03 DIAGNOSIS — N2581 Secondary hyperparathyroidism of renal origin: Secondary | ICD-10-CM | POA: Diagnosis not present

## 2015-11-03 DIAGNOSIS — I120 Hypertensive chronic kidney disease with stage 5 chronic kidney disease or end stage renal disease: Secondary | ICD-10-CM | POA: Insufficient documentation

## 2015-11-03 DIAGNOSIS — N186 End stage renal disease: Secondary | ICD-10-CM | POA: Insufficient documentation

## 2015-11-03 DIAGNOSIS — Z800 Family history of malignant neoplasm of digestive organs: Secondary | ICD-10-CM | POA: Insufficient documentation

## 2015-11-03 DIAGNOSIS — Z882 Allergy status to sulfonamides status: Secondary | ICD-10-CM | POA: Diagnosis not present

## 2015-11-03 DIAGNOSIS — D631 Anemia in chronic kidney disease: Secondary | ICD-10-CM | POA: Diagnosis not present

## 2015-11-03 DIAGNOSIS — T82858A Stenosis of vascular prosthetic devices, implants and grafts, initial encounter: Secondary | ICD-10-CM | POA: Insufficient documentation

## 2015-11-03 DIAGNOSIS — Z7982 Long term (current) use of aspirin: Secondary | ICD-10-CM | POA: Insufficient documentation

## 2015-11-03 DIAGNOSIS — E785 Hyperlipidemia, unspecified: Secondary | ICD-10-CM | POA: Insufficient documentation

## 2015-11-03 DIAGNOSIS — Y841 Kidney dialysis as the cause of abnormal reaction of the patient, or of later complication, without mention of misadventure at the time of the procedure: Secondary | ICD-10-CM | POA: Diagnosis not present

## 2015-11-03 DIAGNOSIS — E119 Type 2 diabetes mellitus without complications: Secondary | ICD-10-CM | POA: Diagnosis not present

## 2015-11-03 DIAGNOSIS — Z8249 Family history of ischemic heart disease and other diseases of the circulatory system: Secondary | ICD-10-CM | POA: Insufficient documentation

## 2015-11-03 DIAGNOSIS — Y832 Surgical operation with anastomosis, bypass or graft as the cause of abnormal reaction of the patient, or of later complication, without mention of misadventure at the time of the procedure: Secondary | ICD-10-CM | POA: Insufficient documentation

## 2015-11-03 DIAGNOSIS — Z992 Dependence on renal dialysis: Secondary | ICD-10-CM | POA: Insufficient documentation

## 2015-11-03 DIAGNOSIS — Z853 Personal history of malignant neoplasm of breast: Secondary | ICD-10-CM | POA: Diagnosis not present

## 2015-11-03 DIAGNOSIS — D509 Iron deficiency anemia, unspecified: Secondary | ICD-10-CM | POA: Diagnosis not present

## 2015-11-03 DIAGNOSIS — I10 Essential (primary) hypertension: Secondary | ICD-10-CM | POA: Diagnosis not present

## 2015-11-03 DIAGNOSIS — E1129 Type 2 diabetes mellitus with other diabetic kidney complication: Secondary | ICD-10-CM | POA: Diagnosis not present

## 2015-11-03 DIAGNOSIS — T82318A Breakdown (mechanical) of other vascular grafts, initial encounter: Secondary | ICD-10-CM | POA: Diagnosis not present

## 2015-11-03 HISTORY — PX: PERIPHERAL VASCULAR CATHETERIZATION: SHX172C

## 2015-11-03 LAB — POTASSIUM (ARMC VASCULAR LAB ONLY): Potassium (ARMC vascular lab): 3.8 (ref 3.5–5.1)

## 2015-11-03 SURGERY — A/V SHUNTOGRAM/FISTULAGRAM
Anesthesia: Moderate Sedation

## 2015-11-03 MED ORDER — ONDANSETRON HCL 4 MG/2ML IJ SOLN: 4.0000 mg | Freq: Four times a day (QID) | INTRAMUSCULAR | Status: DC | PRN

## 2015-11-03 MED ORDER — SODIUM CHLORIDE 0.9 % IV SOLN
INTRAVENOUS | Status: DC
  Administered 2015-11-03: 12:00:00 via INTRAVENOUS

## 2015-11-03 MED ORDER — HEPARIN SODIUM (PORCINE) 1000 UNIT/ML IJ SOLN
INTRAMUSCULAR | Status: AC
  Filled 2015-11-03: qty 1

## 2015-11-03 MED ORDER — FENTANYL CITRATE (PF) 100 MCG/2ML IJ SOLN
INTRAMUSCULAR | Status: DC | PRN
  Administered 2015-11-03 (×2): 50 ug via INTRAVENOUS

## 2015-11-03 MED ORDER — LIDOCAINE-EPINEPHRINE (PF) 1 %-1:200000 IJ SOLN
INTRAMUSCULAR | Status: AC
  Filled 2015-11-03: qty 30

## 2015-11-03 MED ORDER — HEPARIN (PORCINE) IN NACL 2-0.9 UNIT/ML-% IJ SOLN
INTRAMUSCULAR | Status: AC
  Filled 2015-11-03: qty 1000

## 2015-11-03 MED ORDER — HYDROMORPHONE HCL 1 MG/ML IJ SOLN: 1.0000 mg | Freq: Once | INTRAMUSCULAR | Status: DC

## 2015-11-03 MED ORDER — METHYLPREDNISOLONE SODIUM SUCC 125 MG IJ SOLR: 125.0000 mg | INTRAMUSCULAR | Status: DC | PRN

## 2015-11-03 MED ORDER — MIDAZOLAM HCL 5 MG/5ML IJ SOLN
INTRAMUSCULAR | Status: AC
  Filled 2015-11-03: qty 5

## 2015-11-03 MED ORDER — MIDAZOLAM HCL 2 MG/2ML IJ SOLN
INTRAMUSCULAR | Status: DC | PRN
  Administered 2015-11-03 (×2): 2 mg via INTRAVENOUS

## 2015-11-03 MED ORDER — HEPARIN SODIUM (PORCINE) 1000 UNIT/ML IJ SOLN
INTRAMUSCULAR | Status: DC | PRN
Start: 2015-11-03 — End: 2015-11-03
  Administered 2015-11-03: 3000 [IU] via INTRAVENOUS

## 2015-11-03 MED ORDER — FAMOTIDINE 20 MG PO TABS: 40.0000 mg | ORAL_TABLET | ORAL | Status: DC | PRN

## 2015-11-03 MED ORDER — FENTANYL CITRATE (PF) 100 MCG/2ML IJ SOLN
INTRAMUSCULAR | Status: AC
  Filled 2015-11-03: qty 2

## 2015-11-03 MED ORDER — IOPAMIDOL (ISOVUE-300) INJECTION 61%
INTRAVENOUS | Status: DC | PRN
  Administered 2015-11-03: 25 mL via INTRAVENOUS

## 2015-11-03 MED ORDER — DEXTROSE 5 % IV SOLN: 1.5000 g | INTRAVENOUS | Status: DC

## 2015-11-03 SURGICAL SUPPLY — 13 items
BALLN ULTRVRSE 8X100X75 (BALLOONS) ×4
BALLOON ULTRVRSE 8X100X75 (BALLOONS) IMPLANT
CANNULA 5F STIFF (CANNULA) ×4 IMPLANT
DEVICE PRESTO INFLATION (MISCELLANEOUS) ×2 IMPLANT
DRAPE BRACHIAL (DRAPES) ×4 IMPLANT
PACK ANGIOGRAPHY (CUSTOM PROCEDURE TRAY) ×4 IMPLANT
SHEATH BRITE TIP 6FRX5.5 (SHEATH) ×4 IMPLANT
SHEATH BRITE TIP 7FRX5.5 (SHEATH) ×2 IMPLANT
STENT VIABAHN 8X100X120 (Permanent Stent) ×4 IMPLANT
STENT VIABAHN 8X10X120 (Permanent Stent) IMPLANT
TOWEL OR 17X26 4PK STRL BLUE (TOWEL DISPOSABLE) ×4 IMPLANT
WIRE G 018X200 V18 (WIRE) ×2 IMPLANT
WIRE NITINOL .018 (WIRE) ×2 IMPLANT

## 2015-11-03 NOTE — Op Note (Signed)
[name]    OPERATIVE NOTE   PROCEDURE: 1.   Left brachiocephalic arteriovenous fistula cannulation under ultrasound guidance 2.   Left arm arm fistulagram including central venogram 3.   Covered stent placement to the mid to proximal upper arm cephalic [name] placement for stenosis and bleeding using 57m diameter by 10 cm length Viabahn stent  PRE-OPERATIVE DIAGNOSIS: 1. ESRD 2. Poorly functional left brachiocephalic AVF with prolonged bleeding  POST-OPERATIVE DIAGNOSIS: same as above   SURGEON: JLeotis Pain MD  ANESTHESIA: local with MCS  ESTIMATED BLOOD LOSS: Minimal  FINDING(S): 1. Moderate stenosis in the proximal to mid upper arm cephalic [name]  SPECIMEN(S):  None  CONTRAST: 25 cc  FLUORO TIME: 1.4 minutes  MODERATE CONSCIOUS SEDATION TIME: Approximately 20 minutes with 4 mg of Versed and 100 mcg of Fentanyl   INDICATIONS: Jill PERILLOis a 82y.o. female who presents with malfunctioning  left brachiocephalic arteriovenous fistula with aneurysmal degeneration and prolonged bleeding from the access sites. She is required multiple previous interventions.  The patient is scheduled for  left arm fistulagram.  The patient is aware the risks include but are not limited to: bleeding, infection, thrombosis of the cannulated access, and possible anaphylactic reaction to the contrast.  The patient is aware of the risks of the procedure and elects to proceed forward.  DESCRIPTION: After full informed written consent was obtained, the patient was brought back to the angiography suite and placed supine upon the angiography table.  The patient was connected to monitoring equipment. Moderate conscious sedation was administered with a face to face encounter with the patient throughout the procedure with my supervision of the RN administering medicines and monitoring the patient's vital signs and mental status throughout from the start of the procedure until the  patient was taken to the recovery room. The  left arm was prepped and draped in the standard fashion for a percutaneous access intervention.  Under ultrasound guidance, the  left brachiocephalic arteriovenous fistula was cannulated with a micropuncture needle under direct ultrasound guidance and a permanent image was performed.  The microwire was advanced into the fistula and the needle was exchanged for the a microsheath.  I then upsized to a 7 Fr Sheath and imaging was performed.  Hand injections were completed to image the access including the central venous system. This demonstrated moderate stenosis just distal to the previously placed stents and continuing for several centimeters. This is in the 60% range. With the associated prolonged bleeding and aneurysmal degeneration, I felt this should be treated and I felt by placing a covered stent at into what would be the venous access site this may help the prolonged bleeding.  Based on the images, this patient will need intervention to this area and I plan to covered stent placement for the above reasons. I then gave the patient 3000 units of intravenous heparin.  I then crossed the stenosis with a 0.018 wire.  Based on the imaging, a 8 mm x 10 cm  Viabahn covered stent was deployed in the proximal to mid upper arm cephalic [name] bridging just into the previously placed stents proximally. This was postdilated with an 8 mm balloon inflated to 14 atm.  On completion imaging, a less than 10 % residual stenosis was present.     Based on the completion imaging, no further intervention is necessary.  The wire and balloon were removed from the sheath.  A 4-0 Monocryl purse-string suture was sewn around the  sheath.  The sheath was removed while tying down the suture.  A sterile bandage was applied to the puncture site.  COMPLICATIONS: None  CONDITION: Stable   DEW,JASON  11/03/2015 4:32 PM

## 2015-11-03 NOTE — Discharge Instructions (Signed)
Fistulogram, Care After °Refer to this sheet in the next few weeks. These instructions provide you with information on caring for yourself after your procedure. Your health care provider may also give you more specific instructions. Your treatment has been planned according to current medical practices, but problems sometimes occur. Call your health care provider if you have any problems or questions after your procedure. °WHAT TO EXPECT AFTER THE PROCEDURE °After your procedure, it is typical to have the following: °· A small amount of discomfort in the area where the catheters were placed. °· A small amount of bruising around the fistula. °· Sleepiness and fatigue. °HOME CARE INSTRUCTIONS °· Rest at home for the day following your procedure. °· Do not drive or operate heavy machinery while taking pain medicine. °· Take medicines only as directed by your health care provider. °· Do not take baths, swim, or use a hot tub until your health care provider approves. You may shower 24 hours after the procedure or as directed by your health care provider. °· There are many different ways to close and cover an incision, including stitches, skin glue, and adhesive strips. Follow your health care provider's instructions on: °¨ Incision care. °¨ Bandage (dressing) changes and removal. °¨ Incision closure removal. °· Monitor your dialysis fistula carefully. °SEEK MEDICAL CARE IF: °· You have drainage, redness, swelling, or pain at your catheter site. °· You have a fever. °· You have chills. °SEEK IMMEDIATE MEDICAL CARE IF: °· You feel weak. °· You have trouble balancing. °· You have trouble moving your arms or legs. °· You have problems with your speech or vision. °· You can no longer feel a vibration or buzz when you put your fingers over your dialysis fistula. °· The limb that was used for the procedure: °¨ Swells. °¨ Is painful. °¨ Is cold. °¨ Is discolored, such as blue or pale white. °  °This information is not intended  to replace advice given to you by your health care provider. Make sure you discuss any questions you have with your health care provider. °  °Document Released: 07/30/2013 Document Reviewed: 07/30/2013 °Elsevier Interactive Patient Education ©2016 Elsevier Inc. ° °

## 2015-11-03 NOTE — H&P (Signed)
  [facility] History & Physical Update  The patient was interviewed and re-examined.  The patient's previous History and Physical has been reviewed and is unchanged.  There is no change in the plan of care. We plan to proceed with the scheduled procedure.  DEW,JASON, MD  11/03/2015, 12:15 PM

## 2015-11-03 NOTE — Progress Notes (Signed)
Pt clinically stable post fistulogram with stent placement, Dr Lucky Cowboy out to speak with patient and family member, questions answered, may use access immediately, eating lunch, denies complaints,

## 2015-11-04 ENCOUNTER — Encounter: Payer: Self-pay | Admitting: Vascular Surgery

## 2015-11-05 DIAGNOSIS — D509 Iron deficiency anemia, unspecified: Secondary | ICD-10-CM | POA: Diagnosis not present

## 2015-11-05 DIAGNOSIS — E1129 Type 2 diabetes mellitus with other diabetic kidney complication: Secondary | ICD-10-CM | POA: Diagnosis not present

## 2015-11-05 DIAGNOSIS — N2581 Secondary hyperparathyroidism of renal origin: Secondary | ICD-10-CM | POA: Diagnosis not present

## 2015-11-05 DIAGNOSIS — D631 Anemia in chronic kidney disease: Secondary | ICD-10-CM | POA: Diagnosis not present

## 2015-11-05 DIAGNOSIS — N186 End stage renal disease: Secondary | ICD-10-CM | POA: Diagnosis not present

## 2015-11-06 ENCOUNTER — Ambulatory Visit (INDEPENDENT_AMBULATORY_CARE_PROVIDER_SITE_OTHER): Payer: Medicare Other | Admitting: Family Medicine

## 2015-11-06 ENCOUNTER — Encounter: Payer: Self-pay | Admitting: Family Medicine

## 2015-11-06 ENCOUNTER — Ambulatory Visit (INDEPENDENT_AMBULATORY_CARE_PROVIDER_SITE_OTHER): Payer: Medicare Other

## 2015-11-06 VITALS — BP 120/60 | HR 72 | Temp 98.0°F | Ht <= 58 in | Wt 116.0 lb

## 2015-11-06 DIAGNOSIS — I2510 Atherosclerotic heart disease of native coronary artery without angina pectoris: Secondary | ICD-10-CM | POA: Diagnosis not present

## 2015-11-06 DIAGNOSIS — N189 Chronic kidney disease, unspecified: Secondary | ICD-10-CM

## 2015-11-06 DIAGNOSIS — Z7189 Other specified counseling: Secondary | ICD-10-CM | POA: Diagnosis not present

## 2015-11-06 DIAGNOSIS — Z8639 Personal history of other endocrine, nutritional and metabolic disease: Secondary | ICD-10-CM

## 2015-11-06 DIAGNOSIS — Z992 Dependence on renal dialysis: Secondary | ICD-10-CM

## 2015-11-06 DIAGNOSIS — N186 End stage renal disease: Secondary | ICD-10-CM | POA: Diagnosis not present

## 2015-11-06 DIAGNOSIS — D500 Iron deficiency anemia secondary to blood loss (chronic): Secondary | ICD-10-CM

## 2015-11-06 DIAGNOSIS — K31819 Angiodysplasia of stomach and duodenum without bleeding: Secondary | ICD-10-CM

## 2015-11-06 DIAGNOSIS — Z0000 Encounter for general adult medical examination without abnormal findings: Secondary | ICD-10-CM | POA: Diagnosis not present

## 2015-11-06 DIAGNOSIS — D631 Anemia in chronic kidney disease: Secondary | ICD-10-CM

## 2015-11-06 NOTE — Patient Instructions (Signed)
[name] , Thank you for taking time to come for your Medicare Wellness Visit. I appreciate your ongoing commitment to your health goals. Please review the following plan we discussed and let me know if I can assist you in the future.   These are the goals we discussed: Goals    . Increase physical activity          Starting 11/06/2015, I will continue to exercise for at least 30 min 3 days per week.        This is a list of the screening recommended for you and due dates:  Health Maintenance  Topic Date Due  . Flu Shot  03/28/2016*  . DTaP/Tdap/Td vaccine (1 - Tdap) 11/05/2016*  . Shingles Vaccine  11/05/2016*  . Tetanus Vaccine  11/05/2016*  . Pneumonia vaccines (1 of 2 - PCV13) 11/05/2016*  . Urine Protein Check  11/05/2028*  . DEXA scan (bone density measurement)  Completed  *Topic was postponed. The date shown is not the original due date.   Preventive Care for Adults  A healthy lifestyle and preventive care can promote health and wellness. Preventive health guidelines for adults include the following key practices.  . A routine yearly physical is a good way to check with your health care provider about your health and preventive screening. It is a chance to share any concerns and updates on your health and to receive a thorough exam.  . Visit your dentist for a routine exam and preventive care every 6 months. Brush your teeth twice a day and floss once a day. Good oral hygiene prevents tooth decay and gum disease.  . The frequency of eye exams is based on your age, health, family medical history, use  of contact lenses, and other factors. Follow your health care provider's ecommendations for frequency of eye exams.  . Eat a healthy diet. Foods like vegetables, fruits, whole grains, low-fat dairy products, and lean protein foods contain the nutrients you need without too many calories. Decrease your intake of foods high in solid fats, added sugars, and salt. Eat the right  amount of calories for you. Get information about a proper diet from your health care provider, if necessary.  . Regular physical exercise is one of the most important things you can do for your health. Most adults should get at least 150 minutes of moderate-intensity exercise (any activity that increases your heart rate and causes you to sweat) each week. In addition, most adults need muscle-strengthening exercises on 2 or more days a week.  Silver Sneakers may be a benefit available to you. To determine eligibility, you may visit the website: www.silversneakers.com or contact program at (310)755-6829 Mon-Fri between 8AM-8PM.   . Maintain a healthy weight. The body mass index (BMI) is a screening tool to identify possible weight problems. It provides an estimate of body fat based on height and weight. Your health care provider can find your BMI and can help you achieve or maintain a healthy weight.   For adults 20 years and older: ? A BMI below 18.5 is considered underweight. ? A BMI of 18.5 to 24.9 is normal. ? A BMI of 25 to 29.9 is considered overweight. ? A BMI of 30 and above is considered obese.   . Maintain normal blood lipids and cholesterol levels by exercising and minimizing your intake of saturated fat. Eat a balanced diet with plenty of fruit and vegetables. Blood tests for lipids and cholesterol should begin at age 36 and  be repeated every 5 years. If your lipid or cholesterol levels are high, you are over 50, or you are at high risk for heart disease, you may need your cholesterol levels checked more frequently. Ongoing high lipid and cholesterol levels should be treated with medicines if diet and exercise are not working.  . If you smoke, find out from your health care provider how to quit. If you do not use tobacco, please do not start.  . If you choose to drink alcohol, please do not consume more than 2 drinks per day. One drink is considered to be 12 ounces (355 mL) of beer, 5  ounces (148 mL) of wine, or 1.5 ounces (44 mL) of liquor.  . If you are 39-53 years old, ask your health care provider if you should take aspirin to prevent strokes.  . Use sunscreen. Apply sunscreen liberally and repeatedly throughout the day. You should seek shade when your shadow is shorter than you. Protect yourself by wearing long sleeves, pants, a wide-brimmed hat, and sunglasses year round, whenever you are outdoors.  . Once a month, do a whole body skin exam, using a mirror to look at the skin on your back. Tell your health care provider of new moles, moles that have irregular borders, moles that are larger than a pencil eraser, or moles that have changed in shape or color.

## 2015-11-06 NOTE — Assessment & Plan Note (Signed)
Appreciate GI care of patient. On protonix 80mg  once daily.

## 2015-11-06 NOTE — Progress Notes (Signed)
Pre visit review using our clinic review tool, if applicable. No additional management support is needed unless otherwise documented below in the visit note. 

## 2015-11-06 NOTE — Assessment & Plan Note (Addendum)
Followed by renal, HD MWF.  Recent stent placement L brachiocephalic AVF by Dr Lucky Cowboy.

## 2015-11-06 NOTE — Assessment & Plan Note (Signed)
Goal Hgb >9 o/w transfuse given CAD.

## 2015-11-06 NOTE — Assessment & Plan Note (Signed)
Advanced directive discussion - has set up at home. [name] BIL is HCPOA. Will bring me copy.

## 2015-11-06 NOTE — Assessment & Plan Note (Signed)
cbg elevated recently - pt will monitor fasting sugars with her glucose meter at home and return for further lab work if fasting cbg consistently >120. Pt will also decrease added sugars in her diet.

## 2015-11-06 NOTE — Progress Notes (Signed)
BP 120/60 (BP Location: Left Arm, Patient Position: Sitting, Cuff Size: Normal)   Pulse 72   Temp 98 F (36.7 C) (Oral)   Ht 4\' 9"  (1.448 m) Comment: no shoes  Wt 116 lb (52.6 kg)   SpO2 95%   BMI 25.10 kg/m    CC: f/u visit Subjective:    Patient ID: [name], female    DOB: Mar 13, 1932, 82 y.o.   MRN: [id number]  HPI: ADISYNNE STAGGS is a 82 y.o. female presenting on 11/06/2015 for Annual Exam   Saw Katha Cabal earlier today for medicare wellness visit, note will be reviewed when completed. Vitals from prior visit reviewed.  Recent hospitalization for L brachiocephalic AV fistula cannulation and stent placement due to poorly functioning L brachiocephalic AVF with prolonged bleed (Dew).   She has also recently seen Dr Silverio Decamp GI and Dr Stanford Breed cards. Known ESRD on HD with GAVE leading to chronic anemia s/p multiple EGD with APCs. Has been scheduled for rpt surveillance EGD in 6 months. Known severe 3v CAD, managed medically. rec transfusion as needed due to ischemia. rec transfuse if Hgb <9.   Noticing worsening lower back pain worse with prolonged standing and after dialysis prolonged sitting. Known osteoporosis, known chronic T11 compression fracture. Takes tylenol for this - 1000mg  in am.  Preventative: Colon cancer screening - age out Breast cancer screening - mammogram WNL 06/2015. Well woman exam - age out. DEXA scan - 11/2013 showing severe osteoporosis radius T -3.4 Flu shot - yearly - at dialysis center.  Tetanus shot - unsure Pneumonia shot - thinks received at HD center Shingles shot - too expensive Advanced directive discussion - has set up at home. William Cowper BIL is HCPOA. Will bring me copy. Seat belt use discussed Sunscreen use and skin screen discussed. Would like some skin spots on back checked.   Lives alone with her small dog. Bother and sister in law died last year.  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 Interim medical history since our last visit reviewed. Allergies and medications reviewed and updated. Current Outpatient Prescriptions on File Prior to Visit  Medication Sig  . acetaminophen (TYLENOL) 500 MG tablet Take 1,000 mg by mouth every 6 (six) hours as needed for moderate pain.  Marland Kitchen amLODipine (NORVASC) 2.5 MG tablet Take 2.5 mg by mouth at bedtime.  . Artificial Tear Ointment (REFRESH P.M. OP) Place 1 application into the right eye daily.  Marland Kitchen aspirin EC 81 MG tablet Take 1 tablet (81 mg total) by mouth daily.  . B Complex-C-Folic Acid (RENA-VITE PO) Take 1 tablet by mouth daily with breakfast.   . Calcium Carbonate Antacid (TUMS ULTRA 1000 PO) Take 3,000 mg by mouth 3 (three) times daily with meals.   . cholecalciferol (VITAMIN D) 1000 UNITS tablet Take 1,000 Units by mouth every Monday, Wednesday, and Friday.   . fexofenadine (ALLEGRA) 180 MG tablet Take 180 mg by mouth daily.   . metoprolol tartrate (LOPRESSOR) 25 MG tablet Take 0.5 tablets (12.5 mg total) by mouth 2 (two) times daily.  . pantoprazole (PROTONIX) 40 MG tablet Take 1 tablet (40 mg total) by mouth 2 (two) times daily. (Patient taking differently: Take 80 mg by mouth daily. )   No current facility-administered medications on file prior to visit.     Review of Systems Per HPI unless specifically indicated in ROS  section     Objective:    BP 120/60 (BP Location: Left Arm, Patient Position: Sitting, Cuff Size: Normal)   Pulse 72   Temp 98 F (36.7 C) (Oral)   Ht 4\' 9"  (1.448 m) Comment: no shoes  Wt 116 lb (52.6 kg)   SpO2 95%   BMI 25.10 kg/m   Wt Readings from Last 3 Encounters:  11/06/15 116 lb (52.6 kg)  11/06/15 116 lb (52.6 kg)  11/03/15 117 lb (53.1 kg)    Physical Exam  Constitutional: She appears well-developed and well-nourished. No distress.  HENT:  Head: Normocephalic and atraumatic.  Mouth/Throat:  Oropharynx is clear and moist. No oropharyngeal exudate.  Cardiovascular: Normal rate, regular rhythm and intact distal pulses.   Murmur (2/6 SEM) heard. Pulmonary/Chest: Effort normal and breath sounds normal. No respiratory distress. She has no wheezes. She has no rales.  Musculoskeletal: She exhibits no edema.  Skin:  Multiple SKs throughout back of skin  Psychiatric: She has a normal mood and affect.  Nursing note and vitals reviewed.  Results for orders placed or performed during the hospital encounter of 11/03/15  Potassium [facility] vascular lab only)  Result Value Ref Range   Potassium [facility] vascular lab) 3.8 3.5 - 5.1   Lab Results  Component Value Date   WBC 5.9 10/30/2015   HGB 10.8 (L) 10/30/2015   HCT 34.3 (L) 10/30/2015   MCV 88.7 10/30/2015   PLT 245.0 10/30/2015       Assessment & Plan:   Problem List Items Addressed This Visit    Advanced care planning/counseling discussion    Advanced directive discussion - has set up at home. William Cowper BIL is HCPOA. Will bring me copy.      Anemia due to blood loss, chronic    Known GAVE.  New transfusion/intervention threshold <9.      Anemia in chronic kidney disease    Goal Hgb >9 o/w transfuse given CAD.       End stage renal disease on dialysis [facility]) - Primary    Followed by renal, HD MWF.  Recent stent placement L brachiocephalic AVF by Dr Lucky Cowboy.      GAVE (gastric antral vascular ectasia)    Appreciate GI care of patient. On protonix 80mg  once daily.       History of diabetes mellitus, type II    cbg elevated recently - pt will monitor fasting sugars with her glucose meter at home and return for further lab work if fasting cbg consistently >120. Pt will also decrease added sugars in her diet.       Other Visit Diagnoses   None.      Follow up plan: Return in about 6 months (around 05/08/2016), or as needed, for follow up visit.  [name], MD

## 2015-11-06 NOTE — Progress Notes (Signed)
Subjective:   [name] is a 82 y.o. female who presents for an Initial Medicare Annual Wellness Visit.  Review of Systems    N/A  Cardiac Risk Factors include: advanced age (>50men, >67 women);hypertension;dyslipidemia     Objective:    Today's Vitals   11/06/15 1001  BP: 120/60  Pulse: 72  Temp: 98 F (36.7 C)  SpO2: 95%  Weight: 116 lb (52.6 kg)  Height: 4\' 9"  (1.448 m)  PainSc: 0-No pain   Body mass index is 25.1 kg/m.   Current Medications (verified) Outpatient Encounter Prescriptions as of 11/06/2015  Medication Sig  . acetaminophen (TYLENOL) 500 MG tablet Take 1,000 mg by mouth every 6 (six) hours as needed for moderate pain.  Marland Kitchen amLODipine (NORVASC) 2.5 MG tablet Take 2.5 mg by mouth at bedtime.  . Artificial Tear Ointment (REFRESH P.M. OP) Place 1 application into the right eye daily.  Marland Kitchen aspirin EC 81 MG tablet Take 1 tablet (81 mg total) by mouth daily.  Marland Kitchen atorvastatin (LIPITOR) 80 MG tablet Take 80 mg by mouth at bedtime.  . B Complex-C-Folic Acid (RENA-VITE PO) Take 1 tablet by mouth daily with breakfast.   . Calcium Carbonate Antacid (TUMS ULTRA 1000 PO) Take 3,000 mg by mouth 3 (three) times daily with meals.   . cholecalciferol (VITAMIN D) 1000 UNITS tablet Take 1,000 Units by mouth every Monday, Wednesday, and Friday.   . fexofenadine (ALLEGRA) 180 MG tablet Take 180 mg by mouth daily.   . metoprolol tartrate (LOPRESSOR) 25 MG tablet Take 0.5 tablets (12.5 mg total) by mouth 2 (two) times daily.  . pantoprazole (PROTONIX) 40 MG tablet Take 1 tablet (40 mg total) by mouth 2 (two) times daily. (Patient taking differently: Take 80 mg by mouth daily. )   No facility-administered encounter medications on file as of 11/06/2015.     Allergies (verified) Sulfa antibiotics   History: Past Medical History:  Diagnosis Date  . Anemia in chronic kidney disease    IV iron infusion and anemia from GAVE blood loss.  . Arthritis     hands  . Bradycardia 2014     a. Noted 06/2012.  . Breast cancer ([place])    Left Breast 13 yrs. ago  . CAD (coronary artery disease), native coronary artery    3 vessel CAD by cath 04/2015  . Cardiac murmur    a. thought due to AVF (2D echo 06/2012 without significant valvular disease).   . Ectropion of right lower eyelid 11/2012   s/p surgery (Dr. [name])  . End stage renal disease on dialysis [facility])    HD M,W,F [place] AVF now LUE AVF  . Facial droop 1935   acquired during forceps delivery, some residual R visual loss  . Family history of adverse reaction to anesthesia    brother trouble waking up and confusion, now deceased  . GAVE (gastric antral vascular ectasia) 08/2014   s/p EGD with APC  . GERD (gastroesophageal reflux disease)   . Hearing loss    Bilateral hearing aids  . History of blood transfusion 5 weeks ago  . Hx of breast cancer 2004  . Hyperlipidemia   . Hypertension   . Hypotension    a. Associated w/ dialysis.  . Osteoporosis 11/2013   DEXA -3.4 radius  . Sight impaired    Left: wears contact. Right: diminished peripheral vision   Past Surgical History:  Procedure Laterality Date  . AV FISTULA PLACEMENT  07/20/10  Right brachiocephalic AVF  . BASCILIC VEIN TRANSPOSITION Left 07/24/2013   Procedure: BASILIC VEIN TRANSPOSITION;  Surgeon: [name], MD;  Location: [place];  Service: Vascular;  Laterality: Left;  . BREAST BIOPSY Left   . BREAST LUMPECTOMY Left   . CARDIAC CATHETERIZATION N/A 04/10/2015   Procedure: Left Heart Cath and Coronary Angiography;  Surgeon: [name], MD;  Location: [place] CV LAB;  Service: Cardiovascular;  Laterality: N/A;  . CATARACT EXTRACTION Bilateral 1980  . DEXA  2013   [facility]  . ESOPHAGOGASTRODUODENOSCOPY N/A 04/09/2014   Procedure: ESOPHAGOGASTRODUODENOSCOPY (EGD);  Surgeon: [name], MD;  Location: [place];  Service: Endoscopy;  Laterality: N/A;  . ESOPHAGOGASTRODUODENOSCOPY N/A 09/23/2014   Procedure:  ESOPHAGOGASTRODUODENOSCOPY (EGD);  Surgeon: [name], MD;  Location: [place];  Service: Endoscopy;  Laterality: N/A;  . ESOPHAGOGASTRODUODENOSCOPY N/A 12/26/2014   neg H pylori, benign biopsies; ESOPHAGOGASTRODUODENOSCOPY (EGD);  Surgeon: [name], MD  . ESOPHAGOGASTRODUODENOSCOPY N/A 06/15/2015   Procedure: ESOPHAGOGASTRODUODENOSCOPY (EGD);  Surgeon: Milus Banister, MD;  Location: [place];  Service: Endoscopy;  Laterality: N/A;  . ESOPHAGOGASTRODUODENOSCOPY N/A 06/27/2015   Procedure: ESOPHAGOGASTRODUODENOSCOPY (EGD);  Surgeon: Mauri Pole, MD;  Location[facility];  Service: Endoscopy;  Laterality: N/A;  . ESOPHAGOGASTRODUODENOSCOPY (EGD) WITH PROPOFOL N/A 11/12/2014   Procedure: ESOPHAGOGASTRODUODENOSCOPY (EGD) WITH PROPOFOL;  Surgeon: [name], MD;  Location: [facility];  Service: Endoscopy;  Laterality: N/A;  . ESOPHAGOGASTRODUODENOSCOPY (EGD) WITH PROPOFOL N/A 07/31/2015   Procedure: ESOPHAGOGASTRODUODENOSCOPY (EGD) WITH PROPOFOL;  Surgeon: Mauri Pole, MD;  Location: [facility];  Service: Endoscopy;  Laterality: N/A;  . EYE SURGERY  1966   regular cataract  . HERNIA REPAIR    . HOT HEMOSTASIS N/A 11/12/2014   Procedure: HOT HEMOSTASIS (ARGON PLASMA COAGULATION/BICAP);  Surgeon: [name], MD;  Location: [facility];  Service: Endoscopy;  Laterality: N/A;  . HOT HEMOSTASIS N/A 06/27/2015   Procedure: HOT HEMOSTASIS (ARGON PLASMA COAGULATION/BICAP);  Surgeon: Mauri Pole, MD;  Location[facility];  Service: Endoscopy;  Laterality: N/A;  . HOT HEMOSTASIS N/A 07/31/2015   Procedure: HOT HEMOSTASIS (ARGON PLASMA COAGULATION/BICAP);  Surgeon: Mauri Pole, MD;  Location: [facility];  Service: Endoscopy;  Laterality: N/A;  . PERIPHERAL VASCULAR CATHETERIZATION N/A 08/15/2014   Procedure: A/V Shuntogram/Fistulagram;  Surgeon: [name], MD;  Location: [place] CV LAB;  Service: Cardiovascular;  Laterality: N/A;  .  PERIPHERAL VASCULAR CATHETERIZATION N/A 04/28/2015   Procedure: A/V Shuntogram/Fistulagram;  Surgeon: [name], MD;  Location: [place] CV LAB;  Service: Cardiovascular;  Laterality: N/A;  . PERIPHERAL VASCULAR CATHETERIZATION Left 04/28/2015   Procedure: A/V Shunt Intervention;  Surgeon: [name], MD;  Location: [place] CV LAB;  Service: Cardiovascular;  Laterality: Left;  . PERIPHERAL VASCULAR CATHETERIZATION Left 08/07/2015   Procedure: A/V Shuntogram/Fistulagram;  Surgeon: [name], MD;  Location: [place] [facility];  Service: Cardiovascular;  Laterality: Left;  . PERIPHERAL VASCULAR CATHETERIZATION N/A 08/07/2015   Procedure: A/V Shunt Intervention;  Surgeon: [name], MD;  Location: [place] CV LAB;  Service: Cardiovascular;  Laterality: N/A;  . PERIPHERAL VASCULAR CATHETERIZATION Left 11/03/2015   Procedure: A/V Shuntogram/Fistulagram;  Surgeon: [name], MD;  Location: [place] CV LAB;  Service: Cardiovascular;  Laterality: Left;  . PERIPHERAL VASCULAR CATHETERIZATION N/A 11/03/2015   Procedure: A/V Shunt Intervention;  Surgeon: [name], MD;  Location: [place] CV LAB;  Service: Cardiovascular;  Laterality: N/A;  .  SHUNTOGRAM N/A 04/16/2011   Procedure: [place];  Surgeon: [name], MD;  Location: [facility] CATH LAB;  Service: Cardiovascular;  Laterality: N/A;  . SHUNTOGRAM Right 07/05/2013   Procedure: FISTULOGRAM;  Surgeon: [name], MD;  Location: [facility] CATH LAB;  Service: Cardiovascular;  Laterality: Right;  . UMBILICAL HERNIA REPAIR  2011  . US ECHOCARDIOGRAPHY  06/2012   LVH, EF 65%, grade 1 diastol dysfunction, mod dliated LAD   Family History  Problem Relation Age of Onset  . Cancer Father     stomach  . CAD Father     MI at age 58  . Heart disease Father   . Heart attack Father   . Hypertension Mother   . Heart disease Brother     Congenital  . Hypertension Brother   . Diabetes Brother   . Cancer Sister     female (uterus?)    Social History   Occupational History  . 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 Smoking status: Former Smoker    Years: 2.00    Types: Cigarettes    Quit date: 03/29/1953  . Smokeless tobacco: Never Used  . Alcohol use 0.0 oz/week     Comment: occasional  . Drug use: No  . Sexual activity: No    Tobacco Counseling Counseling given: Not Answered   Activities of Daily Living In your present state of health, do you have any difficulty performing the following activities: 11/06/2015 11/03/2015  Hearing? N N  Vision? N N  Difficulty concentrating or making decisions? N N  Walking or climbing stairs? N N  Dressing or bathing? N N  Doing errands, shopping? N -  Preparing Food and eating ? N -  Using the Toilet? N -  In the past six months, have you accidently leaked urine? N -  Do you have problems with loss of bowel control? N -  Managing your Medications? N -  Managing your Finances? N -  Housekeeping or managing your Housekeeping? N -  Some recent data might be hidden    Immunizations and Health Maintenance Immunization History  Administered Date(s) Administered  . Hepatitis B 04/23/2015  . Influenza-Unspecified 02/11/2015   There are no preventive care reminders to display for this patient.  Patient Care Team: [name], MD as PCP - General (Family Medicine) Elmarie Shiley, MD (Nephrology)    Assessment:   This is a routine wellness examination for [place].  Hearing/Vision screen Hearing Screening Comments: Wears bilateral hearing aids Vision Screening Comments: Last vision exam in 10/2015  Dietary issues and exercise activities discussed: Current Exercise Habits: Home exercise routine, Type of exercise: walking;calisthenics, Time (Minutes): 30, Frequency (Times/Week): 3, Weekly Exercise (Minutes/Week): 90, Intensity: Moderate, Exercise limited by: None identified  Goals    . Increase  physical activity          Starting 11/06/2015, I will continue to exercise for at least 30 min 3 days per week.       Depression Screen PHQ 2/9 Scores 11/06/2015  PHQ - 2 Score 0    Fall Risk Fall Risk  11/06/2015  Falls in the past year? No    Cognitive Function: MMSE - Mini Mental State Exam 11/06/2015  Orientation to time 5  Orientation to Place 5  Registration 3  Attention/ Calculation 0  Recall 3  Language- name 2 objects 0  Language- repeat 1  Language- follow 3  step command 3  Language- read & follow direction 0  Write a sentence 0  Copy design 0  Total score 20   PLEASE NOTE: A Mini-Cog screen was completed. Maximum score is 20. A value of 0 denotes this part of [place] MMSE was not completed or the patient failed this part of the Mini-Cog screening.   Mini-Cog Screening Orientation to Time - Max 5 pts Orientation to Place - Max 5 pts Registration - Max 3 pts Recall - Max 3 pts Language Repeat - Max 1 pts Language Follow 3 Step Command - Max 3 pts  Screening Tests Health Maintenance  Topic Date Due  . INFLUENZA VACCINE  03/28/2016 (Originally 10/28/2015)  . DTaP/Tdap/Td (1 - Tdap) 11/05/2016 (Originally 01/01/1951)  . ZOSTAVAX  11/05/2016 (Originally 01/01/1994)  . TETANUS/TDAP  11/05/2016 (Originally 01/01/1951)  . PNA vac Low Risk Adult (1 of 2 - PCV13) 11/05/2016 (Originally 01/02/1999)  . URINE MICROALBUMIN  11/05/2028 (Originally 01/02/1942)  . DEXA SCAN  Completed      Plan:     I have personally reviewed and addressed the Medicare Annual Wellness questionnaire and have noted the following in the patient's chart:  A. Medical and social history B. Use of alcohol, tobacco or illicit drugs  C. Current medications and supplements D. Functional ability and status E.  Nutritional status F.  Physical activity G. Advance directives H. List of other physicians I.  Hospitalizations, surgeries, and ER visits in previous 12 months J.  [place] to  include hearing, vision, cognitive, depression L. Referrals and appointments - none  In addition, I have reviewed and discussed with patient certain preventive protocols, quality metrics, and best practice recommendations. A written personalized care plan for preventive services as well as general preventive health recommendations were provided to patient.  See attached scanned questionnaire for additional information.   Signed,   Lindell Noe, MHA, BS, LPN Health Advisor

## 2015-11-06 NOTE — Assessment & Plan Note (Addendum)
Known GAVE.  New transfusion/intervention threshold <9.

## 2015-11-06 NOTE — Patient Instructions (Addendum)
Bring me a copy of your immunization records from the dialysis center.  Bring me a copy of your advanced directives to update your chart.  Sugar was too high - decrease added sugars in your diet. Start checking fasting sugar at home and if persistently >120 let me know to return for lab visit to check an A1c.

## 2015-11-06 NOTE — Progress Notes (Signed)
PCP notes:   Health maintenance:  Flu vaccine - addressed Shingles - addressed PNA - addressed Tetanus - addressed Note: Pt asked to get copy of immunization record and provide to PCP at next appt  Abnormal screenings:   None  Patient concerns:   Pt states Allegra has not helped with runny nose. Pt would like PCP to recommend or prescribe an alternative.   Nurse concerns:  None  Next PCP appt:   11/06/15 @ 1030

## 2015-11-07 DIAGNOSIS — N186 End stage renal disease: Secondary | ICD-10-CM | POA: Diagnosis not present

## 2015-11-07 DIAGNOSIS — E1129 Type 2 diabetes mellitus with other diabetic kidney complication: Secondary | ICD-10-CM | POA: Diagnosis not present

## 2015-11-07 DIAGNOSIS — D509 Iron deficiency anemia, unspecified: Secondary | ICD-10-CM | POA: Diagnosis not present

## 2015-11-07 DIAGNOSIS — D631 Anemia in chronic kidney disease: Secondary | ICD-10-CM | POA: Diagnosis not present

## 2015-11-07 DIAGNOSIS — N2581 Secondary hyperparathyroidism of renal origin: Secondary | ICD-10-CM | POA: Diagnosis not present

## 2015-11-08 NOTE — Progress Notes (Signed)
I reviewed health advisor's note, was available for consultation, and agree with documentation and plan.  

## 2015-11-10 DIAGNOSIS — D509 Iron deficiency anemia, unspecified: Secondary | ICD-10-CM | POA: Diagnosis not present

## 2015-11-10 DIAGNOSIS — N186 End stage renal disease: Secondary | ICD-10-CM | POA: Diagnosis not present

## 2015-11-10 DIAGNOSIS — D631 Anemia in chronic kidney disease: Secondary | ICD-10-CM | POA: Diagnosis not present

## 2015-11-10 DIAGNOSIS — N2581 Secondary hyperparathyroidism of renal origin: Secondary | ICD-10-CM | POA: Diagnosis not present

## 2015-11-10 DIAGNOSIS — E1129 Type 2 diabetes mellitus with other diabetic kidney complication: Secondary | ICD-10-CM | POA: Diagnosis not present

## 2015-11-10 NOTE — H&P (Signed)
[facility] Admission History & Physical  MRN : [id number]  [name] is a 82 y.o. (02/11/1932) female who presents with chief complaint of No chief complaint on file. Marland Kitchen  History of Present Illness: Patient presents today for evaluation with a fistulogram. She was seen in the office where it was noted that she had prolonged bleeding on multiple dialysis sessions. She has a long-standing left arm AV fistula that has required multiple previous interventions. Duplex showed some areas of mildly elevated velocities with some aneurysmal access sites.  No current facility-administered medications for this encounter.    Current Outpatient Prescriptions  Medication Sig Dispense Refill  . amLODipine (NORVASC) 2.5 MG tablet Take 2.5 mg by mouth at bedtime.    . Artificial Tear Ointment (REFRESH P.M. OP) Place 1 application into the right eye daily.    Marland Kitchen aspirin EC 81 MG tablet Take 1 tablet (81 mg total) by mouth daily. 90 tablet 3  . B Complex-C-Folic Acid (RENA-VITE PO) Take 1 tablet by mouth daily with breakfast.     . Calcium Carbonate Antacid (TUMS ULTRA 1000 PO) Take 3,000 mg by mouth 3 (three) times daily with meals.     . cholecalciferol (VITAMIN D) 1000 UNITS tablet Take 1,000 Units by mouth every Monday, Wednesday, and Friday.     . fexofenadine (ALLEGRA) 180 MG tablet Take 180 mg by mouth daily.     . metoprolol tartrate (LOPRESSOR) 25 MG tablet Take 0.5 tablets (12.5 mg total) by mouth 2 (two) times daily. 60 tablet 11  . pantoprazole (PROTONIX) 40 MG tablet Take 1 tablet (40 mg total) by mouth 2 (two) times daily. (Patient taking differently: Take 80 mg by mouth daily. ) 60 tablet 5  . acetaminophen (TYLENOL) 500 MG tablet Take 1,000 mg by mouth every 6 (six) hours as needed for moderate pain.    Marland Kitchen atorvastatin (LIPITOR) 80 MG tablet Take 80 mg by mouth at bedtime.      Past Medical History:  Diagnosis Date  . Anemia in chronic kidney disease    IV iron  infusion and anemia from GAVE blood loss.  . Arthritis     hands  . Bradycardia 2014   a. Noted 06/2012.  . Breast cancer ([place])    Left Breast 13 yrs. ago  . CAD (coronary artery disease), native coronary artery    3 vessel CAD by cath 04/2015  . Cardiac murmur    a. thought due to AVF (2D echo 06/2012 without significant valvular disease).   . Ectropion of right lower eyelid 11/2012   s/p surgery (Dr. [name])  . End stage renal disease on dialysis [facility])    HD M,W,F [place] AVF now LUE AVF  . Facial droop 1935   acquired during forceps delivery, some residual R visual loss  . Family history of adverse reaction to anesthesia    brother trouble waking up and confusion, now deceased  . GAVE (gastric antral vascular ectasia) 08/2014   s/p EGD with APC  . GERD (gastroesophageal reflux disease)   . Hearing loss    Bilateral hearing aids  . History of blood transfusion 5 weeks ago  . Hx of breast cancer 2004  . Hyperlipidemia   . Hypertension   . Hypotension    a. Associated w/ dialysis.  . Osteoporosis 11/2013   DEXA -3.4 radius  . Sight impaired    Left: wears contact. Right: diminished peripheral vision    Past Surgical  History:  Procedure Laterality Date  . AV FISTULA PLACEMENT  07/20/10   Right brachiocephalic AVF  . BASCILIC VEIN TRANSPOSITION Left 07/24/2013   Procedure: BASILIC VEIN TRANSPOSITION;  Surgeon: [name], MD;  Location: [place];  Service: Vascular;  Laterality: Left;  . BREAST BIOPSY Left   . BREAST LUMPECTOMY Left   . CARDIAC CATHETERIZATION N/A 04/10/2015   Procedure: Left Heart Cath and Coronary Angiography;  Surgeon: [name], MD;  Location: [place] [facility];  Service: Cardiovascular;  Laterality: N/A;  . CATARACT EXTRACTION Bilateral 1980  . DEXA  2013   [facility]  . ESOPHAGOGASTRODUODENOSCOPY N/A 04/09/2014   Procedure: ESOPHAGOGASTRODUODENOSCOPY (EGD);  Surgeon: [name], MD;  Location: [place];  Service:  Endoscopy;  Laterality: N/A;  . ESOPHAGOGASTRODUODENOSCOPY N/A 09/23/2014   Procedure: ESOPHAGOGASTRODUODENOSCOPY (EGD);  Surgeon: [name], MD;  Location: [place];  Service: Endoscopy;  Laterality: N/A;  . ESOPHAGOGASTRODUODENOSCOPY N/A 12/26/2014   neg H pylori, benign biopsies; ESOPHAGOGASTRODUODENOSCOPY (EGD);  Surgeon: [name], MD  . ESOPHAGOGASTRODUODENOSCOPY N/A 06/15/2015   Procedure: ESOPHAGOGASTRODUODENOSCOPY (EGD);  Surgeon: Milus Banister, MD;  Location: [place];  Service: Endoscopy;  Laterality: N/A;  . ESOPHAGOGASTRODUODENOSCOPY N/A 06/27/2015   Procedure: ESOPHAGOGASTRODUODENOSCOPY (EGD);  Surgeon: Mauri Pole, MD;  [facility];  Service: Endoscopy;  Laterality: N/A;  . ESOPHAGOGASTRODUODENOSCOPY (EGD) WITH PROPOFOL N/A 11/12/2014   Procedure: ESOPHAGOGASTRODUODENOSCOPY (EGD) WITH PROPOFOL;  Surgeon: [name], MD;  Location: [facility];  Service: Endoscopy;  Laterality: N/A;  . ESOPHAGOGASTRODUODENOSCOPY (EGD) WITH PROPOFOL N/A 07/31/2015   Procedure: ESOPHAGOGASTRODUODENOSCOPY (EGD) WITH PROPOFOL;  Surgeon: Mauri Pole, MD;  Location: [facility];  Service: Endoscopy;  Laterality: N/A;  . EYE SURGERY  1966   regular cataract  . HERNIA REPAIR    . HOT HEMOSTASIS N/A 11/12/2014   Procedure: HOT HEMOSTASIS (ARGON PLASMA COAGULATION/BICAP);  Surgeon: [name], MD;  Location: [facility];  Service: Endoscopy;  Laterality: N/A;  . HOT HEMOSTASIS N/A 06/27/2015   Procedure: HOT HEMOSTASIS (ARGON PLASMA COAGULATION/BICAP);  Surgeon: Mauri Pole, MD;  Location[facility];  Service: Endoscopy;  Laterality: N/A;  . HOT HEMOSTASIS N/A 07/31/2015   Procedure: HOT HEMOSTASIS (ARGON PLASMA COAGULATION/BICAP);  Surgeon: Mauri Pole, MD;  Location[facility];  Service: Endoscopy;  Laterality: N/A;  . PERIPHERAL VASCULAR CATHETERIZATION N/A 08/15/2014   Procedure: A/V Shuntogram/Fistulagram;  Surgeon: [name], MD;   Location: [place] [facility];  Service: Cardiovascular;  Laterality: N/A;  . PERIPHERAL VASCULAR CATHETERIZATION N/A 04/28/2015   Procedure: A/V Shuntogram/Fistulagram;  Surgeon: [name], MD;  Location: [place] [facility];  Service: Cardiovascular;  Laterality: N/A;  . PERIPHERAL VASCULAR CATHETERIZATION Left 04/28/2015   Procedure: A/V Shunt Intervention;  Surgeon: [name], MD;  Location: [place] [facility];  Service: Cardiovascular;  Laterality: Left;  . PERIPHERAL VASCULAR CATHETERIZATION Left 08/07/2015   Procedure: A/V Shuntogram/Fistulagram;  Surgeon: [name], MD;  Location: [place] [facility];  Service: Cardiovascular;  Laterality: Left;  . PERIPHERAL VASCULAR CATHETERIZATION N/A 08/07/2015   Procedure: A/V Shunt Intervention;  Surgeon: [name], MD;  Location: [place] [facility];  Service: Cardiovascular;  Laterality: N/A;  . PERIPHERAL VASCULAR CATHETERIZATION Left 11/03/2015   Procedure: A/V Shuntogram/Fistulagram;  Surgeon: [name], MD;  Location: [place] [facility];  Service: Cardiovascular;  Laterality: Left;  . PERIPHERAL VASCULAR CATHETERIZATION N/A 11/03/2015   Procedure: A/V Shunt Intervention;  Surgeon: [name], MD;  Location: [place] [facility];  Service: Cardiovascular;  Laterality: N/A;  . SHUNTOGRAM N/A 04/16/2011   Procedure: [place];  Surgeon: [name], MD;  Location: [facility];  Service: Cardiovascular;  Laterality: N/A;  . SHUNTOGRAM Right 07/05/2013   Procedure: FISTULOGRAM;  Surgeon: [name], MD;  Location: [facility] C[facility] LAB;  Service: Cardiovascular;  Laterality: Right;  . UMBILICAL HERNIA REPAIR  2011  . US ECHOCARDIOGRAPHY  06/2012   LVH, EF 65%, grade 1 diastol dysfunction, mod dliated LAD    Social History Social History  Substance Use Topics  . Smoking status: Former Smoker    Years: 2.00    Types: Cigarettes    Quit date: 03/29/1953  . Smokeless tobacco: Never Used  . Alcohol use 0.0 oz/week     Comment:  occasional  No IV drug use  Family History Family History  Problem Relation Age of Onset  . Cancer Father     stomach  . CAD Father     MI at age 39  . Heart disease Father   . Heart attack Father   . Hypertension Mother   . Heart disease Brother     Congenital  . Hypertension Brother   . Diabetes Brother   . Cancer Sister     female (uterus?)    Allergies  Allergen Reactions  . Sulfa Antibiotics Rash     REVIEW OF SYSTEMS (Negative unless checked)  Constitutional: [] Weight loss  [] Fever  [] Chills Cardiac: [] Chest pain   [] Chest pressure   [] Palpitations   [] Shortness of breath when laying flat   [] Shortness of breath at rest   [] Shortness of breath with exertion. Vascular:  [] Pain in legs with walking   [] Pain in legs at rest   [] Pain in legs when laying flat   [] Claudication   [] Pain in feet when walking  [] Pain in feet at rest  [] Pain in feet when laying flat   [] History of DVT   [] Phlebitis   [] Swelling in legs   [] Varicose veins   [] Non-healing ulcers Pulmonary:   [] Uses home oxygen   [] Productive cough   [] Hemoptysis   [] Wheeze  [] COPD   [] Asthma Neurologic:  [] Dizziness  [] Blackouts   [] Seizures   [] History of stroke   [] History of TIA  [] Aphasia   [] Temporary blindness   [] Dysphagia   [] Weakness or numbness in arms   [] Weakness or numbness in legs Musculoskeletal:  [] Arthritis   [] Joint swelling   [] Joint pain   [] Low back pain Hematologic:  [] Easy bruising  [x] Easy bleeding   [] Hypercoagulable state   [x] Anemic  [] Hepatitis Gastrointestinal:  [] Blood in stool   [] Vomiting blood  [] Gastroesophageal reflux/heartburn   [] Difficulty swallowing. Genitourinary:  [x] Chronic kidney disease   [] Difficult urination  [] Frequent urination  [] Burning with urination   [] Blood in urine Skin:  [] Rashes   [] Ulcers   [] Wounds Psychological:  [] History of anxiety   []  History of major depression.  Physical Examination  Vitals:   11/03/15 1500 11/03/15 1510 11/03/15 1515 11/03/15  1527  BP: (!) 71/40 (!) 80/42 (!) 121/57 (!) 120/56  Pulse:      Resp: 15 13 16 20   Temp:      TempSrc:      SpO2: 95%  96%   Weight:      Height:       Body mass index is 25.32 kg/m. Gen: WD/WN, NAD Head: Significant left facial droop noted. Ear/Nose/Throat: Hearing grossly intact, nares w/o erythema or drainage, oropharynx w/o  Erythema/Exudate,  Eyes: PERRLA, EOMI.  Neck: Supple, no nuchal rigidity.  No bruit or JVD.  Pulmonary:  Good air movement, clear to auscultation bilaterally, no use of accessory muscles.  Cardiac: RRR, normal S1, S2, no Murmurs, rubs or gallops. Vascular: Thrill and bruit present in left arm AV fistula Vessel Right Left  Radial Palpable Palpable                                   Gastrointestinal: soft, non-tender/non-distended. No guarding/reflex.  Musculoskeletal: M/S 5/5 throughout.  Extremities without ischemic changes.  No deformity or atrophy.  Neurologic: Left facial droop noted. Pain and light touch intact in extremities.  Symmetrical.  Speech is fluent. Motor exam as listed above. Psychiatric: Judgment intact, Mood & affect appropriate for pt's clinical situation. Dermatologic: No rashes or ulcers noted.  No cellulitis or open wounds. Lymph : No Cervical, Axillary, or Inguinal lymphadenopathy.     CBC Lab Results  Component Value Date   WBC 5.9 10/30/2015   HGB 10.8 (L) 10/30/2015   HCT 34.3 (L) 10/30/2015   MCV 88.7 10/30/2015   PLT 245.0 10/30/2015    BMET    Component Value Date/Time   NA 139 10/30/2015 0916   NA 136 10/23/2013 1504   K 4.0 10/30/2015 0916   K 3.7 11/07/2013 1055   K 5.1 07/12/2012   CL 97 10/30/2015 0916   CL 98 10/23/2013 1504   CO2 30 10/30/2015 0916   CO2 30 10/23/2013 1504   GLUCOSE 278 (H) 10/30/2015 0916   GLUCOSE 231 (H) 10/23/2013 1504   BUN 43 (H) 10/30/2015 0916   BUN 49 (H) 10/23/2013 1504   CREATININE 6.47 (HH) 10/30/2015 0916   CREATININE 5.95 (H) 10/23/2013 1504   CREATININE 6.9  02/14/2013   CALCIUM 9.4 10/30/2015 0916   CALCIUM 9.0 10/23/2013 1504   GFRNONAA 6 (L) 06/19/2015 0949   GFRNONAA 6 (L) 10/23/2013 1504   GFRAA 7 (L) 06/19/2015 0949   GFRAA 7 (L) 10/23/2013 1504   Estimated Creatinine Clearance: 4.8 mL/min (by C-G formula based on SCr of 6.47 mg/dL).  COAG Lab Results  Component Value Date   INR 1.04 06/19/2015   INR 1.00 04/09/2015   INR 1.02 09/22/2014    Radiology No results found.    Assessment/Plan 1. Dysfunction of dialysis access with prolonged bleeding. fistulogram today. Risks and benefits discussed. 2. ESRD. Needs better access. 3. Hypertension. Stable on outpatient medications.   Azoria Abbett, MD  11/10/2015 8:31 AM

## 2015-11-12 DIAGNOSIS — N2581 Secondary hyperparathyroidism of renal origin: Secondary | ICD-10-CM | POA: Diagnosis not present

## 2015-11-12 DIAGNOSIS — D631 Anemia in chronic kidney disease: Secondary | ICD-10-CM | POA: Diagnosis not present

## 2015-11-12 DIAGNOSIS — D509 Iron deficiency anemia, unspecified: Secondary | ICD-10-CM | POA: Diagnosis not present

## 2015-11-12 DIAGNOSIS — E1129 Type 2 diabetes mellitus with other diabetic kidney complication: Secondary | ICD-10-CM | POA: Diagnosis not present

## 2015-11-12 DIAGNOSIS — N186 End stage renal disease: Secondary | ICD-10-CM | POA: Diagnosis not present

## 2015-11-14 DIAGNOSIS — N186 End stage renal disease: Secondary | ICD-10-CM | POA: Diagnosis not present

## 2015-11-14 DIAGNOSIS — D509 Iron deficiency anemia, unspecified: Secondary | ICD-10-CM | POA: Diagnosis not present

## 2015-11-14 DIAGNOSIS — E1129 Type 2 diabetes mellitus with other diabetic kidney complication: Secondary | ICD-10-CM | POA: Diagnosis not present

## 2015-11-14 DIAGNOSIS — N2581 Secondary hyperparathyroidism of renal origin: Secondary | ICD-10-CM | POA: Diagnosis not present

## 2015-11-14 DIAGNOSIS — D631 Anemia in chronic kidney disease: Secondary | ICD-10-CM | POA: Diagnosis not present

## 2015-11-17 DIAGNOSIS — N2581 Secondary hyperparathyroidism of renal origin: Secondary | ICD-10-CM | POA: Diagnosis not present

## 2015-11-17 DIAGNOSIS — D631 Anemia in chronic kidney disease: Secondary | ICD-10-CM | POA: Diagnosis not present

## 2015-11-17 DIAGNOSIS — E1129 Type 2 diabetes mellitus with other diabetic kidney complication: Secondary | ICD-10-CM | POA: Diagnosis not present

## 2015-11-17 DIAGNOSIS — N186 End stage renal disease: Secondary | ICD-10-CM | POA: Diagnosis not present

## 2015-11-17 DIAGNOSIS — D509 Iron deficiency anemia, unspecified: Secondary | ICD-10-CM | POA: Diagnosis not present

## 2015-11-19 DIAGNOSIS — N2581 Secondary hyperparathyroidism of renal origin: Secondary | ICD-10-CM | POA: Diagnosis not present

## 2015-11-19 DIAGNOSIS — D509 Iron deficiency anemia, unspecified: Secondary | ICD-10-CM | POA: Diagnosis not present

## 2015-11-19 DIAGNOSIS — N186 End stage renal disease: Secondary | ICD-10-CM | POA: Diagnosis not present

## 2015-11-19 DIAGNOSIS — D631 Anemia in chronic kidney disease: Secondary | ICD-10-CM | POA: Diagnosis not present

## 2015-11-19 DIAGNOSIS — E1129 Type 2 diabetes mellitus with other diabetic kidney complication: Secondary | ICD-10-CM | POA: Diagnosis not present

## 2015-11-20 ENCOUNTER — Other Ambulatory Visit: Payer: Self-pay | Admitting: Gastroenterology

## 2015-11-21 DIAGNOSIS — D509 Iron deficiency anemia, unspecified: Secondary | ICD-10-CM | POA: Diagnosis not present

## 2015-11-21 DIAGNOSIS — N2581 Secondary hyperparathyroidism of renal origin: Secondary | ICD-10-CM | POA: Diagnosis not present

## 2015-11-21 DIAGNOSIS — E1129 Type 2 diabetes mellitus with other diabetic kidney complication: Secondary | ICD-10-CM | POA: Diagnosis not present

## 2015-11-21 DIAGNOSIS — N186 End stage renal disease: Secondary | ICD-10-CM | POA: Diagnosis not present

## 2015-11-21 DIAGNOSIS — D631 Anemia in chronic kidney disease: Secondary | ICD-10-CM | POA: Diagnosis not present

## 2015-11-24 DIAGNOSIS — N2581 Secondary hyperparathyroidism of renal origin: Secondary | ICD-10-CM | POA: Diagnosis not present

## 2015-11-24 DIAGNOSIS — E1129 Type 2 diabetes mellitus with other diabetic kidney complication: Secondary | ICD-10-CM | POA: Diagnosis not present

## 2015-11-24 DIAGNOSIS — D631 Anemia in chronic kidney disease: Secondary | ICD-10-CM | POA: Diagnosis not present

## 2015-11-24 DIAGNOSIS — D509 Iron deficiency anemia, unspecified: Secondary | ICD-10-CM | POA: Diagnosis not present

## 2015-11-24 DIAGNOSIS — N186 End stage renal disease: Secondary | ICD-10-CM | POA: Diagnosis not present

## 2015-11-26 DIAGNOSIS — N2581 Secondary hyperparathyroidism of renal origin: Secondary | ICD-10-CM | POA: Diagnosis not present

## 2015-11-26 DIAGNOSIS — E1129 Type 2 diabetes mellitus with other diabetic kidney complication: Secondary | ICD-10-CM | POA: Diagnosis not present

## 2015-11-26 DIAGNOSIS — D509 Iron deficiency anemia, unspecified: Secondary | ICD-10-CM | POA: Diagnosis not present

## 2015-11-26 DIAGNOSIS — N186 End stage renal disease: Secondary | ICD-10-CM | POA: Diagnosis not present

## 2015-11-26 DIAGNOSIS — D631 Anemia in chronic kidney disease: Secondary | ICD-10-CM | POA: Diagnosis not present

## 2015-11-27 DIAGNOSIS — Z992 Dependence on renal dialysis: Secondary | ICD-10-CM | POA: Diagnosis not present

## 2015-11-27 DIAGNOSIS — E1129 Type 2 diabetes mellitus with other diabetic kidney complication: Secondary | ICD-10-CM | POA: Diagnosis not present

## 2015-11-27 DIAGNOSIS — N186 End stage renal disease: Secondary | ICD-10-CM | POA: Diagnosis not present

## 2015-11-28 DIAGNOSIS — D509 Iron deficiency anemia, unspecified: Secondary | ICD-10-CM | POA: Diagnosis not present

## 2015-11-28 DIAGNOSIS — N2581 Secondary hyperparathyroidism of renal origin: Secondary | ICD-10-CM | POA: Diagnosis not present

## 2015-11-28 DIAGNOSIS — N186 End stage renal disease: Secondary | ICD-10-CM | POA: Diagnosis not present

## 2015-11-28 DIAGNOSIS — Z23 Encounter for immunization: Secondary | ICD-10-CM | POA: Diagnosis not present

## 2015-11-28 DIAGNOSIS — D631 Anemia in chronic kidney disease: Secondary | ICD-10-CM | POA: Diagnosis not present

## 2015-11-28 DIAGNOSIS — E1129 Type 2 diabetes mellitus with other diabetic kidney complication: Secondary | ICD-10-CM | POA: Diagnosis not present

## 2015-11-28 HISTORY — PX: OTHER SURGICAL HISTORY: SHX169

## 2015-12-01 DIAGNOSIS — N2581 Secondary hyperparathyroidism of renal origin: Secondary | ICD-10-CM | POA: Diagnosis not present

## 2015-12-01 DIAGNOSIS — D509 Iron deficiency anemia, unspecified: Secondary | ICD-10-CM | POA: Diagnosis not present

## 2015-12-01 DIAGNOSIS — Z23 Encounter for immunization: Secondary | ICD-10-CM | POA: Diagnosis not present

## 2015-12-01 DIAGNOSIS — N186 End stage renal disease: Secondary | ICD-10-CM | POA: Diagnosis not present

## 2015-12-01 DIAGNOSIS — D631 Anemia in chronic kidney disease: Secondary | ICD-10-CM | POA: Diagnosis not present

## 2015-12-01 DIAGNOSIS — E1129 Type 2 diabetes mellitus with other diabetic kidney complication: Secondary | ICD-10-CM | POA: Diagnosis not present

## 2015-12-03 DIAGNOSIS — D509 Iron deficiency anemia, unspecified: Secondary | ICD-10-CM | POA: Diagnosis not present

## 2015-12-03 DIAGNOSIS — Z23 Encounter for immunization: Secondary | ICD-10-CM | POA: Diagnosis not present

## 2015-12-03 DIAGNOSIS — E1129 Type 2 diabetes mellitus with other diabetic kidney complication: Secondary | ICD-10-CM | POA: Diagnosis not present

## 2015-12-03 DIAGNOSIS — N2581 Secondary hyperparathyroidism of renal origin: Secondary | ICD-10-CM | POA: Diagnosis not present

## 2015-12-03 DIAGNOSIS — N186 End stage renal disease: Secondary | ICD-10-CM | POA: Diagnosis not present

## 2015-12-03 DIAGNOSIS — D631 Anemia in chronic kidney disease: Secondary | ICD-10-CM | POA: Diagnosis not present

## 2015-12-05 DIAGNOSIS — N2581 Secondary hyperparathyroidism of renal origin: Secondary | ICD-10-CM | POA: Diagnosis not present

## 2015-12-05 DIAGNOSIS — D631 Anemia in chronic kidney disease: Secondary | ICD-10-CM | POA: Diagnosis not present

## 2015-12-05 DIAGNOSIS — Z23 Encounter for immunization: Secondary | ICD-10-CM | POA: Diagnosis not present

## 2015-12-05 DIAGNOSIS — N186 End stage renal disease: Secondary | ICD-10-CM | POA: Diagnosis not present

## 2015-12-05 DIAGNOSIS — E1129 Type 2 diabetes mellitus with other diabetic kidney complication: Secondary | ICD-10-CM | POA: Diagnosis not present

## 2015-12-05 DIAGNOSIS — D509 Iron deficiency anemia, unspecified: Secondary | ICD-10-CM | POA: Diagnosis not present

## 2015-12-07 DIAGNOSIS — N2581 Secondary hyperparathyroidism of renal origin: Secondary | ICD-10-CM | POA: Diagnosis not present

## 2015-12-07 DIAGNOSIS — N186 End stage renal disease: Secondary | ICD-10-CM | POA: Diagnosis not present

## 2015-12-07 DIAGNOSIS — D631 Anemia in chronic kidney disease: Secondary | ICD-10-CM | POA: Diagnosis not present

## 2015-12-07 DIAGNOSIS — Z23 Encounter for immunization: Secondary | ICD-10-CM | POA: Diagnosis not present

## 2015-12-07 DIAGNOSIS — E1129 Type 2 diabetes mellitus with other diabetic kidney complication: Secondary | ICD-10-CM | POA: Diagnosis not present

## 2015-12-07 DIAGNOSIS — D509 Iron deficiency anemia, unspecified: Secondary | ICD-10-CM | POA: Diagnosis not present

## 2015-12-09 DIAGNOSIS — T82858A Stenosis of vascular prosthetic devices, implants and grafts, initial encounter: Secondary | ICD-10-CM | POA: Diagnosis not present

## 2015-12-09 DIAGNOSIS — E785 Hyperlipidemia, unspecified: Secondary | ICD-10-CM | POA: Diagnosis not present

## 2015-12-09 DIAGNOSIS — Y841 Kidney dialysis as the cause of abnormal reaction of the patient, or of later complication, without mention of misadventure at the time of the procedure: Secondary | ICD-10-CM | POA: Diagnosis not present

## 2015-12-09 DIAGNOSIS — E119 Type 2 diabetes mellitus without complications: Secondary | ICD-10-CM | POA: Diagnosis not present

## 2015-12-09 DIAGNOSIS — Z992 Dependence on renal dialysis: Secondary | ICD-10-CM | POA: Diagnosis not present

## 2015-12-09 DIAGNOSIS — N186 End stage renal disease: Secondary | ICD-10-CM | POA: Diagnosis not present

## 2015-12-09 DIAGNOSIS — T82318A Breakdown (mechanical) of other vascular grafts, initial encounter: Secondary | ICD-10-CM | POA: Diagnosis not present

## 2015-12-09 DIAGNOSIS — I10 Essential (primary) hypertension: Secondary | ICD-10-CM | POA: Diagnosis not present

## 2015-12-10 DIAGNOSIS — E1129 Type 2 diabetes mellitus with other diabetic kidney complication: Secondary | ICD-10-CM | POA: Diagnosis not present

## 2015-12-10 DIAGNOSIS — D509 Iron deficiency anemia, unspecified: Secondary | ICD-10-CM | POA: Diagnosis not present

## 2015-12-10 DIAGNOSIS — N2581 Secondary hyperparathyroidism of renal origin: Secondary | ICD-10-CM | POA: Diagnosis not present

## 2015-12-10 DIAGNOSIS — N186 End stage renal disease: Secondary | ICD-10-CM | POA: Diagnosis not present

## 2015-12-10 DIAGNOSIS — D631 Anemia in chronic kidney disease: Secondary | ICD-10-CM | POA: Diagnosis not present

## 2015-12-10 DIAGNOSIS — Z23 Encounter for immunization: Secondary | ICD-10-CM | POA: Diagnosis not present

## 2015-12-12 DIAGNOSIS — E1129 Type 2 diabetes mellitus with other diabetic kidney complication: Secondary | ICD-10-CM | POA: Diagnosis not present

## 2015-12-12 DIAGNOSIS — D509 Iron deficiency anemia, unspecified: Secondary | ICD-10-CM | POA: Diagnosis not present

## 2015-12-12 DIAGNOSIS — D631 Anemia in chronic kidney disease: Secondary | ICD-10-CM | POA: Diagnosis not present

## 2015-12-12 DIAGNOSIS — N2581 Secondary hyperparathyroidism of renal origin: Secondary | ICD-10-CM | POA: Diagnosis not present

## 2015-12-12 DIAGNOSIS — Z23 Encounter for immunization: Secondary | ICD-10-CM | POA: Diagnosis not present

## 2015-12-12 DIAGNOSIS — N186 End stage renal disease: Secondary | ICD-10-CM | POA: Diagnosis not present

## 2015-12-14 ENCOUNTER — Emergency Department (HOSPITAL_COMMUNITY): Payer: Medicare Other

## 2015-12-14 ENCOUNTER — Emergency Department (HOSPITAL_COMMUNITY)
Admission: EM | Admit: 2015-12-14 | Discharge: 2015-12-14 | Disposition: A | Discharge status: Home / Self Care | Payer: Medicare Other | Attending: Emergency Medicine | Admitting: Emergency Medicine

## 2015-12-14 DIAGNOSIS — S199XXA Unspecified injury of neck, initial encounter: Secondary | ICD-10-CM | POA: Diagnosis not present

## 2015-12-14 DIAGNOSIS — W19XXXA Unspecified fall, initial encounter: Secondary | ICD-10-CM

## 2015-12-14 DIAGNOSIS — Z79899 Other long term (current) drug therapy: Secondary | ICD-10-CM | POA: Diagnosis not present

## 2015-12-14 DIAGNOSIS — S098XXA Other specified injuries of head, initial encounter: Secondary | ICD-10-CM | POA: Diagnosis not present

## 2015-12-14 DIAGNOSIS — E1122 Type 2 diabetes mellitus with diabetic chronic kidney disease: Secondary | ICD-10-CM | POA: Insufficient documentation

## 2015-12-14 DIAGNOSIS — N186 End stage renal disease: Secondary | ICD-10-CM | POA: Diagnosis not present

## 2015-12-14 DIAGNOSIS — Y999 Unspecified external cause status: Secondary | ICD-10-CM | POA: Insufficient documentation

## 2015-12-14 DIAGNOSIS — Y92009 Unspecified place in unspecified non-institutional (private) residence as the place of occurrence of the external cause: Secondary | ICD-10-CM | POA: Diagnosis not present

## 2015-12-14 DIAGNOSIS — Z992 Dependence on renal dialysis: Secondary | ICD-10-CM | POA: Insufficient documentation

## 2015-12-14 DIAGNOSIS — I120 Hypertensive chronic kidney disease with stage 5 chronic kidney disease or end stage renal disease: Secondary | ICD-10-CM | POA: Insufficient documentation

## 2015-12-14 DIAGNOSIS — Z7982 Long term (current) use of aspirin: Secondary | ICD-10-CM | POA: Diagnosis not present

## 2015-12-14 DIAGNOSIS — Z853 Personal history of malignant neoplasm of breast: Secondary | ICD-10-CM | POA: Insufficient documentation

## 2015-12-14 DIAGNOSIS — Y939 Activity, unspecified: Secondary | ICD-10-CM | POA: Insufficient documentation

## 2015-12-14 DIAGNOSIS — I2510 Atherosclerotic heart disease of native coronary artery without angina pectoris: Secondary | ICD-10-CM | POA: Insufficient documentation

## 2015-12-14 DIAGNOSIS — W01198A Fall on same level from slipping, tripping and stumbling with subsequent striking against other object, initial encounter: Secondary | ICD-10-CM | POA: Diagnosis not present

## 2015-12-14 DIAGNOSIS — Z87891 Personal history of nicotine dependence: Secondary | ICD-10-CM | POA: Diagnosis not present

## 2015-12-14 DIAGNOSIS — S0181XA Laceration without foreign body of other part of head, initial encounter: Secondary | ICD-10-CM

## 2015-12-14 DIAGNOSIS — S0990XA Unspecified injury of head, initial encounter: Secondary | ICD-10-CM | POA: Diagnosis present

## 2015-12-14 DIAGNOSIS — Z23 Encounter for immunization: Secondary | ICD-10-CM | POA: Insufficient documentation

## 2015-12-14 MED ORDER — TETANUS-DIPHTH-ACELL PERTUSSIS 5-2.5-18.5 LF-MCG/0.5 IM SUSP
0.5000 mL | Freq: Once | INTRAMUSCULAR | Status: AC
  Administered 2015-12-14: 0.5 mL via INTRAMUSCULAR
  Filled 2015-12-14: qty 0.5

## 2015-12-14 NOTE — ED Provider Notes (Signed)
[facility] DEPT Provider Note   CSN: [id number] Arrival date & time: 12/14/15  0401  History   Chief Complaint Chief Complaint  Patient presents with  . Fall  . Head Laceration    HPI [name] is a 82 y.o. female.  HPI  82 y.o. female with a hx of ESRD on Dialysis, presents to the Emergency Department today via EMS s/p mechanical fall. Pt states that she on her bathroom rug this evening and struck her forehead on her bathtub. Notes no LOC. Endorses headache currently that she rates 6/10. No N/V. No visual changes. States the laceration on her head was bleeding profusely, but since controlled. Pt only on ASA. NO other blood thinners. NO CP/SOB/ABD pain. No Numbness/tingling. No other symptoms noted.   Past Medical History:  Diagnosis Date  . Anemia in chronic kidney disease    IV iron infusion and anemia from GAVE blood loss.  . Arthritis     hands  . Bradycardia 2014   a. Noted 06/2012.  . Breast cancer ([place])    Left Breast 13 yrs. ago  . CAD (coronary artery disease), native coronary artery    3 vessel CAD by cath 04/2015  . Cardiac murmur    a. thought due to AVF (2D echo 06/2012 without significant valvular disease).   . Ectropion of right lower eyelid 11/2012   s/p surgery (Dr. [name])  . End stage renal disease on dialysis [facility])    HD M,W,F [place] AVF now LUE AVF  . Facial droop 1935   acquired during forceps delivery, some residual R visual loss  . Family history of adverse reaction to anesthesia    brother trouble waking up and confusion, now deceased  . GAVE (gastric antral vascular ectasia) 08/2014   s/p EGD with APC  . GERD (gastroesophageal reflux disease)   . Hearing loss    Bilateral hearing aids  . History of blood transfusion 5 weeks ago  . Hx of breast cancer 2004  . Hyperlipidemia   . Hypertension   . Hypotension    a. Associated w/ dialysis.  . Osteoporosis 11/2013   DEXA -3.4 radius  . Sight impaired    Left: wears  contact. Right: diminished peripheral vision    Patient Active Problem List   Diagnosis Date Noted  . Advanced care planning/counseling discussion 11/06/2015  . Moderate to severe pulmonary hypertension ([place]) 06/13/2015  . Abnormal chest x-ray 06/13/2015  . CAD-severe multivessel, not CABG candidate 05/06/2015  . Abnormal nuclear stress test   . Abnormal cardiovascular function study 03/06/2015  . Demand ischemia ([place])   . Acute on chronic diastolic congestive heart failure ([place]) 09/23/2014  . NSTEMI (non-ST elevated myocardial infarction) ([place])   . GAVE (gastric antral vascular ectasia) 04/09/2014  . Anemia due to blood loss, chronic 04/08/2014  . Right shoulder injury 03/12/2014  . Left foot pain 01/10/2014  . Nasal congestion 11/13/2013  . Osteoarthritis, hand 11/13/2013  . Imbalance 11/16/2012  . Abnormal electrocardiogram 07/25/2012  . Mood disorder ([place]) 06/01/2012  . Systolic murmur 0000000  . History of diabetes mellitus, type II   . Hyperlipidemia   . Hypertension   . Chronic Facial droop   . Anemia in chronic kidney disease   . End stage renal disease on dialysis ([place])   . GERD (gastroesophageal reflux disease)     Past Surgical History:  Procedure Laterality Date  . AV FISTULA PLACEMENT  07/20/10   Right brachiocephalic AVF  .  BASCILIC VEIN TRANSPOSITION Left 07/24/2013   Procedure: BASILIC VEIN TRANSPOSITION;  Surgeon: [name], MD;  Location: [facility];  Service: Vascular;  Laterality: Left;  . BREAST BIOPSY Left   . BREAST LUMPECTOMY Left   . CARDIAC CATHETERIZATION N/A 04/10/2015   Procedure: Left Heart Cath and Coronary Angiography;  Surgeon: [name], MD;  Location: [facility];  Service: Cardiovascular;  Laterality: N/A;  . CATARACT EXTRACTION Bilateral 1980  . DEXA  2013   [facility]  . ESOPHAGOGASTRODUODENOSCOPY N/A 04/09/2014   Procedure: ESOPHAGOGASTRODUODENOSCOPY (EGD);  Surgeon: [name], MD;  Location: [facility];  Service:  Endoscopy;  Laterality: N/A;  . ESOPHAGOGASTRODUODENOSCOPY N/A 09/23/2014   Procedure: ESOPHAGOGASTRODUODENOSCOPY (EGD);  Surgeon: [name], MD;  Location: [facility];  Service: Endoscopy;  Laterality: N/A;  . ESOPHAGOGASTRODUODENOSCOPY N/A 12/26/2014   neg H pylori, benign biopsies; ESOPHAGOGASTRODUODENOSCOPY (EGD);  Surgeon: [name], MD  . ESOPHAGOGASTRODUODENOSCOPY N/A 06/15/2015   Procedure: ESOPHAGOGASTRODUODENOSCOPY (EGD);  Surgeon: Milus Banister, MD;  Location: [place];  Service: Endoscopy;  Laterality: N/A;  . ESOPHAGOGASTRODUODENOSCOPY N/A 06/27/2015   Procedure: ESOPHAGOGASTRODUODENOSCOPY (EGD);  Surgeon: Mauri Pole, MD;  Location[facility];  Service: Endoscopy;  Laterality: N/A;  . ESOPHAGOGASTRODUODENOSCOPY (EGD) WITH PROPOFOL N/A 11/12/2014   Procedure: ESOPHAGOGASTRODUODENOSCOPY (EGD) WITH PROPOFOL;  Surgeon: [name], MD;  Location: [facility];  Service: Endoscopy;  Laterality: N/A;  . ESOPHAGOGASTRODUODENOSCOPY (EGD) WITH PROPOFOL N/A 07/31/2015   Procedure: ESOPHAGOGASTRODUODENOSCOPY (EGD) WITH PROPOFOL;  Surgeon: Mauri Pole, MD;  Location: [facility];  Service: Endoscopy;  Laterality: N/A;  . EYE SURGERY  1966   regular cataract  . HERNIA REPAIR    . HOT HEMOSTASIS N/A 11/12/2014   Procedure: HOT HEMOSTASIS (ARGON PLASMA COAGULATION/BICAP);  Surgeon: [name], MD;  Location: [facility];  Service: Endoscopy;  Laterality: N/A;  . HOT HEMOSTASIS N/A 06/27/2015   Procedure: HOT HEMOSTASIS (ARGON PLASMA COAGULATION/BICAP);  Surgeon: Mauri Pole, MD;  Location[facility];  Service: Endoscopy;  Laterality: N/A;  . HOT HEMOSTASIS N/A 07/31/2015   Procedure: HOT HEMOSTASIS (ARGON PLASMA COAGULATION/BICAP);  Surgeon: Mauri Pole, MD;  Location: [facility];  Service: Endoscopy;  Laterality: N/A;  . PERIPHERAL VASCULAR CATHETERIZATION N/A 08/15/2014   Procedure: A/V Shuntogram/Fistulagram;  Surgeon: [name], MD;   Location: [facility];  Service: Cardiovascular;  Laterality: N/A;  . PERIPHERAL VASCULAR CATHETERIZATION N/A 04/28/2015   Procedure: A/V Shuntogram/Fistulagram;  Surgeon: [name], MD;  Location: [facility];  Service: Cardiovascular;  Laterality: N/A;  . PERIPHERAL VASCULAR CATHETERIZATION Left 04/28/2015   Procedure: A/V Shunt Intervention;  Surgeon: [name], MD;  Location: [employer] [facility];  Service: Cardiovascular;  Laterality: Left;  . PERIPHERAL VASCULAR CATHETERIZATION Left 08/07/2015   Procedure: A/V Shuntogram/Fistulagram;  Surgeon: [name], MD;  Location: [facility];  Service: Cardiovascular;  Laterality: Left;  . PERIPHERAL VASCULAR CATHETERIZATION N/A 08/07/2015   Procedure: A/V Shunt Intervention;  Surgeon: [name], MD;  Location: [place] [facility];  Service: Cardiovascular;  Laterality: N/A;  . PERIPHERAL VASCULAR CATHETERIZATION Left 11/03/2015   Procedure: A/V Shuntogram/Fistulagram;  Surgeon: [name], MD;  Location: [place] [facility];  Service: Cardiovascular;  Laterality: Left;  . PERIPHERAL VASCULAR CATHETERIZATION N/A 11/03/2015   Procedure: A/V Shunt Intervention;  Surgeon: [name], MD;  Location: [facility];  Service: Cardiovascular;  Laterality: N/A;  . SHUNTOGRAM N/A 04/16/2011  Procedure: SHUNTOGRAM;  Surgeon: [name], MD;  Location: [facility] CATH LAB;  Service: Cardiovascular;  Laterality: N/A;  . SHUNTOGRAM Right 07/05/2013   Procedure: FISTULOGRAM;  Surgeon: [name], MD;  Location: [facility] CATH LAB;  Service: Cardiovascular;  Laterality: Right;  . UMBILICAL HERNIA REPAIR  2011  . US ECHOCARDIOGRAPHY  06/2012   LVH, EF 65%, grade 1 diastol dysfunction, mod dliated LAD    OB History    No data available       Home Medications    Prior to Admission medications   Medication Sig Start Date End Date Taking? Authorizing Provider  acetaminophen (TYLENOL) 500 MG tablet Take 1,000 mg by mouth every 6  (six) hours as needed for moderate pain.   Yes Historical Provider, MD  amLODipine (NORVASC) 2.5 MG tablet Take 2.5 mg by mouth at bedtime.   Yes Historical Provider, MD  Artificial Tear Ointment (REFRESH P.M. OP) Place 1 application into the right eye daily.   Yes Historical Provider, MD  aspirin EC 81 MG tablet Take 1 tablet (81 mg total) by mouth daily. 03/06/15  Yes Lelon Perla, MD  atorvastatin (LIPITOR) 80 MG tablet Take 80 mg by mouth at bedtime.   Yes Historical Provider, MD  B Complex-C-Folic Acid (RENA-VITE PO) Take 1 tablet by mouth daily with breakfast.    Yes Historical Provider, MD  Calcium Carbonate Antacid (TUMS ULTRA 1000 PO) Take 3,000 mg by mouth 3 (three) times daily after meals.    Yes Historical Provider, MD  cholecalciferol (VITAMIN D) 1000 UNITS tablet Take 1,000 Units by mouth every Monday, Wednesday, and Friday.    Yes Historical Provider, MD  fexofenadine (ALLEGRA) 180 MG tablet Take 180 mg by mouth daily.    Yes Historical Provider, MD  metoprolol tartrate (LOPRESSOR) 25 MG tablet Take 0.5 tablets (12.5 mg total) by mouth 2 (two) times daily. 09/25/15  Yes Lelon Perla, MD  pantoprazole (PROTONIX) 40 MG tablet TAKE 1 TABLET BY MOUTH TWICE A DAY Patient taking differently: TAKE 1 TABLET BY MOUTH ONCE IN THE MORNING 11/21/15  Yes Mauri Pole, MD    Family History Family History  Problem Relation Age of Onset  . Cancer Father     stomach  . CAD Father     MI at age 36  . Heart disease Father   . Heart attack Father   . Hypertension Mother   . Heart disease Brother     Congenital  . Hypertension Brother   . Diabetes Brother   . Cancer Sister     female (uterus?)    Social History Social History  Substance Use Topics  . Smoking status: Former Smoker    Years: 2.00    Types: Cigarettes    Quit date: 03/29/1953  . Smokeless tobacco: Never Used  . Alcohol use 0.0 oz/week     Comment: occasional    Allergies   Sulfa antibiotics   Review of  Systems Review of Systems ROS reviewed and all are negative for acute change except as noted in the HPI.  Physical Exam Updated Vital Signs BP 181/68   Pulse 78   Temp 97.5 F (36.4 C) (Oral)   Resp 18   SpO2 95%   Physical Exam  Constitutional: She is oriented to person, place, and time. Vital signs are normal. She appears well-developed and well-nourished.  HENT:  Head: Normocephalic and atraumatic.  Right Ear: Hearing normal.  Left Ear: Hearing normal.  Pt with baseline  right facial droop (congenital). 1.5 inch superficial vertical laceration on forehead. Bleeding controlled.   Eyes: Conjunctivae and EOM are normal. Pupils are equal, round, and reactive to light.  Neck: Normal range of motion. Neck supple.  Cardiovascular: Normal rate, regular rhythm, normal heart sounds and intact distal pulses.  Exam reveals no gallop and no friction rub.   No murmur heard. Pulmonary/Chest: Effort normal and breath sounds normal. No respiratory distress. She has no wheezes. She has no rales. She exhibits no tenderness.  Musculoskeletal:       Cervical back: Normal.  Left AV Fistula noted with palpable thrill  Neurological: She is alert and oriented to person, place, and time. She has normal strength. No cranial nerve deficit or sensory deficit.  Cranial Nerves:  II: Pupils equal, round, reactive to light III,IV, VI: ptosis not present, extra-ocular motions intact bilaterally  V,VII: smile symmetric, facial light touch sensation equal VIII: hearing grossly normal bilaterally  IX,X: midline uvula rise  XI: bilateral shoulder shrug equal and strong XII: midline tongue extension  Skin: Skin is warm and dry.  Psychiatric: She has a normal mood and affect. Her speech is normal and behavior is normal. Thought content normal.  Nursing note and vitals reviewed.  ED Treatments / Results  Labs (all labs ordered are listed, but only abnormal results are displayed) Labs Reviewed - No data to  display  EKG  EKG Interpretation None      Radiology Ct Head Wo Contrast  Result Date: 12/14/2015 CLINICAL DATA:  Slip and fall on rug at home striking forehead on toilet. Laceration. No loss of consciousness. EXAM: CT HEAD WITHOUT CONTRAST CT CERVICAL SPINE WITHOUT CONTRAST TECHNIQUE: Multidetector CT imaging of the head and cervical spine was performed following the standard protocol without intravenous contrast. Multiplanar CT image reconstructions of the cervical spine were also generated. COMPARISON:  Head CT 06/13/2015 FINDINGS: CT HEAD FINDINGS Brain: No evidence of acute infarction, hemorrhage, hydrocephalus, extra-axial collection or mass lesion/mass effect. Mild atrophy and chronic small vessel ischemia. Vascular: No hyperdense vessel or unexpected calcification. Atherosclerosis of skullbase vasculature. Skull: No acute fracture. Heterogeneous appearance of bone marrow stable from prior. Small right frontal scalp laceration without large hematoma. Sinuses/Orbits: No acute abnormality. Bilateral cataract resection. Paranasal sinuses are well-aerated. Chronic opacification of lower right mastoid air cells. CT CERVICAL SPINE FINDINGS Alignment: Exaggerated kyphosis. Mild broad-based leftward curvature. No listhesis. Skull base and vertebrae: No acute fracture. Heterogeneous appearance of the vertebral bodies with scattered small lucencies, likely related to chronic disease, patient with history of end-stage renal disease. Soft tissues and spinal canal: No prevertebral fluid or swelling. No visible canal hematoma. Left thyroid nodule partially included. Disc levels: Disc space narrowing from C3-C4 through C6-C7 with endplate spurring. Upper chest: No acute abnormality. IMPRESSION: 1. Small right frontal scalp laceration. No fracture or acute intracranial abnormality. 2. Degenerative change in the cervical spine without acute fracture or subluxation. Electronically Signed   By: Jeb Levering M.D.    On: 12/14/2015 05:33   Ct Cervical Spine Wo Contrast  Result Date: 12/14/2015 CLINICAL DATA:  Slip and fall on rug at home striking forehead on toilet. Laceration. No loss of consciousness. EXAM: CT HEAD WITHOUT CONTRAST CT CERVICAL SPINE WITHOUT CONTRAST TECHNIQUE: Multidetector CT imaging of the head and cervical spine was performed following the standard protocol without intravenous contrast. Multiplanar CT image reconstructions of the cervical spine were also generated. COMPARISON:  Head CT 06/13/2015 FINDINGS: CT HEAD FINDINGS Brain: No evidence of acute infarction,  hemorrhage, hydrocephalus, extra-axial collection or mass lesion/mass effect. Mild atrophy and chronic small vessel ischemia. Vascular: No hyperdense vessel or unexpected calcification. Atherosclerosis of skullbase vasculature. Skull: No acute fracture. Heterogeneous appearance of bone marrow stable from prior. Small right frontal scalp laceration without large hematoma. Sinuses/Orbits: No acute abnormality. Bilateral cataract resection. Paranasal sinuses are well-aerated. Chronic opacification of lower right mastoid air cells. CT CERVICAL SPINE FINDINGS Alignment: Exaggerated kyphosis. Mild broad-based leftward curvature. No listhesis. Skull base and vertebrae: No acute fracture. Heterogeneous appearance of the vertebral bodies with scattered small lucencies, likely related to chronic disease, patient with history of end-stage renal disease. Soft tissues and spinal canal: No prevertebral fluid or swelling. No visible canal hematoma. Left thyroid nodule partially included. Disc levels: Disc space narrowing from C3-C4 through C6-C7 with endplate spurring. Upper chest: No acute abnormality. IMPRESSION: 1. Small right frontal scalp laceration. No fracture or acute intracranial abnormality. 2. Degenerative change in the cervical spine without acute fracture or subluxation. Electronically Signed   By: Jeb Levering M.D.   On: 12/14/2015 05:33     Procedures .Marland KitchenLaceration Repair Date/Time: 12/14/2015 6:03 AM Performed by: Shary Decamp Authorized by: Shary Decamp   Consent:    Consent obtained:  Verbal   Consent given by:  Patient Anesthesia (see MAR for exact dosages):    Anesthesia method:  None Laceration details:    Location:  Scalp   Scalp location:  Frontal   Length (cm):  3 Exploration:    Hemostasis achieved with:  Direct pressure   Wound exploration: entire depth of wound probed and visualized   Treatment:    Area cleansed with:  Hibiclens   Amount of cleaning:  Standard   Irrigation method:  Pressure wash Skin repair:    Repair method:  Tissue adhesive Approximation:    Approximation:  Close   [place] border: well-aligned   Post-procedure details:    Dressing:  Open (no dressing)   Patient tolerance of procedure:  Tolerated well, no immediate complications   (including critical care time)  Medications Ordered in ED Medications - No data to display   Initial Impression / Assessment and Plan / ED Course  I have reviewed the triage vital signs and the nursing notes.  Pertinent labs & imaging results that were available during my care of the patient were reviewed by me and considered in my medical decision making (see chart for details).  Clinical Course   Final Clinical Impressions(s) / ED Diagnoses  I have reviewed and evaluated the relevant imaging studies.  I have reviewed the relevant previous healthcare records. I have reviewed EMS Documentation. I obtained HPI from historian. Patient discussed with supervising physician  ED Course:  Assessment: Pt is a 70yF with hx ESRD onDialysis who presents due to mechanical fall with head trauma on bathtub. No LOC. Notes headache. No N/V. No visual changes. Pt not on blood thinners. On exam, pt in NAD. Nontoxic/nonseptic appearing. VSS. Afebrile. Lungs CTA. Heart RRR. Abdomen nontender soft. ROM neck intact. 1.5 inch superficial vertical lac on forehead.  Bleeding controlled. Tdap booster given. Pressure irrigation performed. Bottom of the wound visualized with bleeding controled. Laceration occurred < 8 hours prior to repair which was well tolerated. Pt has no co morbidities to effect normal wound healing.  CT Head unremarkable. CT C Spine unremarkable. Sealed wound with Dermabond. Plan is to DC home with follow up to PCP. At time of discharge, Patient is in no acute distress. Vital Signs are stable. Patient is able  to ambulate. Patient able to tolerate PO.    Disposition/Plan:  DC Home Additional Verbal discharge instructions given and discussed with patient.  Pt Instructed to f/u with PCP in the next week for evaluation and treatment of symptoms. Return precautions given Pt acknowledges and agrees with plan  Supervising Physician April Palumbo, MD   Final diagnoses:  Fall  Forehead laceration, initial encounter    New Prescriptions New Prescriptions   No medications on file     Shary Decamp, PA-C 12/14/15 [id number]    April Palumbo, MD 12/14/15 [phone]

## 2015-12-14 NOTE — ED Notes (Signed)
Pt from home, slipped and fell on rug and hit forehead on toilet. Has a 1.5 in laceration on forehead. Denies LOC.

## 2015-12-14 NOTE — Discharge Instructions (Signed)
Please read and follow all provided instructions.  Your diagnoses today include:  1. Forehead laceration, initial encounter   2. Fall    Tests performed today include: Vital signs. See below for your results today.   Medications prescribed:  Take as prescribed   Home care instructions:  Follow any educational materials contained in this packet.  Follow-up instructions: Please follow-up with your primary care provider for further evaluation of symptoms and treatment   Return instructions:  Please return to the Emergency Department if you do not get better, if you get worse, or new symptoms OR  - Fever (temperature greater than 101.43F)  - Bleeding that does not stop with holding pressure to the area    -Severe pain (please note that you may be more sore the day after your accident)  - Chest Pain  - Difficulty breathing  - Severe nausea or vomiting  - Inability to tolerate food and liquids  - Passing out  - Skin becoming red around your wounds  - Change in mental status (confusion or lethargy)  - New numbness or weakness    Please return if you have any other emergent concerns.  Additional Information:  Your vital signs today were: BP 171/75 (BP Location: Right Arm)    Pulse 84    Temp 97.5 F (36.4 C) (Oral)    Resp 18    SpO2 92%  If your blood pressure (BP) was elevated above 135/85 this visit, please have this repeated by your doctor within one month. ---------------

## 2015-12-14 NOTE — ED Notes (Signed)
Bed: WA17 Expected date:  Expected time:  Means of arrival:  Comments: 

## 2015-12-15 DIAGNOSIS — N2581 Secondary hyperparathyroidism of renal origin: Secondary | ICD-10-CM | POA: Diagnosis not present

## 2015-12-15 DIAGNOSIS — Z23 Encounter for immunization: Secondary | ICD-10-CM | POA: Diagnosis not present

## 2015-12-15 DIAGNOSIS — D509 Iron deficiency anemia, unspecified: Secondary | ICD-10-CM | POA: Diagnosis not present

## 2015-12-15 DIAGNOSIS — N186 End stage renal disease: Secondary | ICD-10-CM | POA: Diagnosis not present

## 2015-12-15 DIAGNOSIS — E1129 Type 2 diabetes mellitus with other diabetic kidney complication: Secondary | ICD-10-CM | POA: Diagnosis not present

## 2015-12-15 DIAGNOSIS — D631 Anemia in chronic kidney disease: Secondary | ICD-10-CM | POA: Diagnosis not present

## 2015-12-16 ENCOUNTER — Encounter: Payer: Self-pay | Admitting: Family Medicine

## 2015-12-16 ENCOUNTER — Encounter: Payer: Self-pay | Admitting: Anesthesiology

## 2015-12-16 ENCOUNTER — Ambulatory Visit (INDEPENDENT_AMBULATORY_CARE_PROVIDER_SITE_OTHER): Payer: Medicare Other | Admitting: Family Medicine

## 2015-12-16 VITALS — BP 121/79 | HR 80 | Temp 97.0°F | Wt 118.5 lb

## 2015-12-16 DIAGNOSIS — S0181XD Laceration without foreign body of other part of head, subsequent encounter: Secondary | ICD-10-CM | POA: Diagnosis not present

## 2015-12-16 DIAGNOSIS — W19XXXA Unspecified fall, initial encounter: Secondary | ICD-10-CM | POA: Diagnosis not present

## 2015-12-16 DIAGNOSIS — I2510 Atherosclerotic heart disease of native coronary artery without angina pectoris: Secondary | ICD-10-CM | POA: Diagnosis not present

## 2015-12-16 DIAGNOSIS — M810 Age-related osteoporosis without current pathological fracture: Secondary | ICD-10-CM | POA: Diagnosis not present

## 2015-12-16 DIAGNOSIS — W19XXXD Unspecified fall, subsequent encounter: Secondary | ICD-10-CM | POA: Diagnosis not present

## 2015-12-16 DIAGNOSIS — S0181XA Laceration without foreign body of other part of head, initial encounter: Secondary | ICD-10-CM | POA: Insufficient documentation

## 2015-12-16 NOTE — Progress Notes (Signed)
BP 121/79 (BP Location: Right Arm, Patient Position: Sitting)   Pulse 80   Temp 97 F (36.1 C) (Oral)   Wt 118 lb 8 oz (53.8 kg)   BMI 25.64 kg/m    CC: ER f/u visit Subjective:    Patient ID: [name], female    DOB: 06/30/1931, 82 y.o.   MRN: [id number]  HPI: [name] is a 82 y.o. female presenting on 12/16/2015 for Follow-up (ER)   Recent ER note from Sunday 2d ago reviewed. Pt suffered fall with forehead laceration and large R forearm bruise - tripped over bathroom rug at 2am. Denies premonitory sxs prior to fall. Repaired with dermabond. Tdap updated.   CT head and CT spine were ok.  She did have DEXA scan this morning.  HA slowly improving. She has thrown away bathroom rugs.  Relevant past medical, surgical, family and social history reviewed and updated as indicated. Interim medical history since our last visit reviewed. Allergies and medications reviewed and updated. Current Outpatient Prescriptions on File Prior to Visit  Medication Sig  . acetaminophen (TYLENOL) 500 MG tablet Take 1,000 mg by mouth every 6 (six) hours as needed for moderate pain.  Marland Kitchen amLODipine (NORVASC) 2.5 MG tablet Take 2.5 mg by mouth at bedtime.  . Artificial Tear Ointment (REFRESH P.M. OP) Place 1 application into the right eye daily.  Marland Kitchen aspirin EC 81 MG tablet Take 1 tablet (81 mg total) by mouth daily.  Marland Kitchen atorvastatin (LIPITOR) 80 MG tablet Take 80 mg by mouth at bedtime.  . B Complex-C-Folic Acid (RENA-VITE PO) Take 1 tablet by mouth daily with breakfast.   . Calcium Carbonate Antacid (TUMS ULTRA 1000 PO) Take 3,000 mg by mouth 3 (three) times daily after meals.   . cholecalciferol (VITAMIN D) 1000 UNITS tablet Take 1,000 Units by mouth every Monday, Wednesday, and Friday.   . fexofenadine (ALLEGRA) 180 MG tablet Take 180 mg by mouth daily.   . metoprolol tartrate (LOPRESSOR) 25 MG tablet Take 0.5 tablets (12.5 mg total) by mouth 2 (two) times daily.  . pantoprazole (PROTONIX) 40 MG  tablet TAKE 1 TABLET BY MOUTH TWICE A DAY (Patient taking differently: TAKE 1 TABLET BY MOUTH ONCE IN THE MORNING)   No current facility-administered medications on file prior to visit.     Review of Systems Per HPI unless specifically indicated in ROS section     Objective:    BP 121/79 (BP Location: Right Arm, Patient Position: Sitting)   Pulse 80   Temp 97 F (36.1 C) (Oral)   Wt 118 lb 8 oz (53.8 kg)   BMI 25.64 kg/m   Wt Readings from Last 3 Encounters:  12/16/15 118 lb 8 oz (53.8 kg)  11/06/15 116 lb (52.6 kg)  11/06/15 116 lb (52.6 kg)    Physical Exam  Constitutional: She appears well-developed and well-nourished. No distress.  HENT:  Vertical laceration forehead as well as small abrasion/laceration on right forehead  Musculoskeletal: She exhibits no edema.  Skin: Skin is warm and dry. No rash noted.  Ecchymosis R arm lateral to elbow   Nursing note and vitals reviewed.  Results for orders placed or performed during the hospital encounter of 11/03/15  Potassium [facility] vascular lab only)  Result Value Ref Range   Potassium [facility] vascular lab) 3.8 3.5 - 5.1      Assessment & Plan:   Problem List Items Addressed This Visit    Fall    Mechanical fall. Pt has  thrown away bathroom rugs that caused fall.       Forehead laceration - Primary    Forehead laceration x2, dermabond used to close 2nd laceration/abrasion. No signs of infection. RTC 2 d recheck.        Other Visit Diagnoses   None.      Follow up plan: Return in about 2 days (around 12/18/2015) for follow up visit.  [name], MD

## 2015-12-16 NOTE — Patient Instructions (Signed)
Extra dermabond placed today.  Return in 2 days for recheck.

## 2015-12-16 NOTE — Assessment & Plan Note (Signed)
Mechanical fall. Pt has thrown away bathroom rugs that caused fall.

## 2015-12-16 NOTE — Assessment & Plan Note (Signed)
Forehead laceration x2, dermabond used to close 2nd laceration/abrasion. No signs of infection. RTC 2 d recheck.

## 2015-12-16 NOTE — Progress Notes (Signed)
Pre visit review using our clinic review tool, if applicable. No additional management support is needed unless otherwise documented below in the visit note. 

## 2015-12-17 DIAGNOSIS — N186 End stage renal disease: Secondary | ICD-10-CM | POA: Diagnosis not present

## 2015-12-17 DIAGNOSIS — E1129 Type 2 diabetes mellitus with other diabetic kidney complication: Secondary | ICD-10-CM | POA: Diagnosis not present

## 2015-12-17 DIAGNOSIS — D509 Iron deficiency anemia, unspecified: Secondary | ICD-10-CM | POA: Diagnosis not present

## 2015-12-17 DIAGNOSIS — D631 Anemia in chronic kidney disease: Secondary | ICD-10-CM | POA: Diagnosis not present

## 2015-12-17 DIAGNOSIS — Z23 Encounter for immunization: Secondary | ICD-10-CM | POA: Diagnosis not present

## 2015-12-17 DIAGNOSIS — N2581 Secondary hyperparathyroidism of renal origin: Secondary | ICD-10-CM | POA: Diagnosis not present

## 2015-12-18 ENCOUNTER — Ambulatory Visit (INDEPENDENT_AMBULATORY_CARE_PROVIDER_SITE_OTHER): Payer: Self-pay | Admitting: Family Medicine

## 2015-12-18 ENCOUNTER — Encounter: Payer: Self-pay | Admitting: Family Medicine

## 2015-12-18 VITALS — BP 100/56 | HR 67 | Temp 97.5°F | Wt 116.0 lb

## 2015-12-18 DIAGNOSIS — S0181XD Laceration without foreign body of other part of head, subsequent encounter: Secondary | ICD-10-CM

## 2015-12-18 NOTE — Progress Notes (Signed)
Vitals:   12/18/15 1146  BP: (!) 100/56  Pulse: 67  Temp: 97.5 F (36.4 C)   See prior note for details.  Here for recheck of forehead lacerations. Last visit dermabond reinforced. Overall doing well.  Some ongoing pressure with bending head forward, no other headaches.  Physical Exam  Constitutional: She is well-developed, well-nourished, and in no distress.  Skin: Skin is warm and dry. No erythema.  dermabond remains in place. Small bit of gauze removed from edge of wound, pt tolerated well.  Nursing note and vitals reviewed.   Forehead laceration Both wounds continue healing well.

## 2015-12-18 NOTE — Assessment & Plan Note (Signed)
Both wounds continue healing well.

## 2015-12-18 NOTE — Progress Notes (Signed)
Pre visit review using our clinic review tool, if applicable. No additional management support is needed unless otherwise documented below in the visit note. 

## 2015-12-19 DIAGNOSIS — N2581 Secondary hyperparathyroidism of renal origin: Secondary | ICD-10-CM | POA: Diagnosis not present

## 2015-12-19 DIAGNOSIS — E1129 Type 2 diabetes mellitus with other diabetic kidney complication: Secondary | ICD-10-CM | POA: Diagnosis not present

## 2015-12-19 DIAGNOSIS — D631 Anemia in chronic kidney disease: Secondary | ICD-10-CM | POA: Diagnosis not present

## 2015-12-19 DIAGNOSIS — Z23 Encounter for immunization: Secondary | ICD-10-CM | POA: Diagnosis not present

## 2015-12-19 DIAGNOSIS — D509 Iron deficiency anemia, unspecified: Secondary | ICD-10-CM | POA: Diagnosis not present

## 2015-12-19 DIAGNOSIS — N186 End stage renal disease: Secondary | ICD-10-CM | POA: Diagnosis not present

## 2015-12-22 DIAGNOSIS — D509 Iron deficiency anemia, unspecified: Secondary | ICD-10-CM | POA: Diagnosis not present

## 2015-12-22 DIAGNOSIS — Z23 Encounter for immunization: Secondary | ICD-10-CM | POA: Diagnosis not present

## 2015-12-22 DIAGNOSIS — N2581 Secondary hyperparathyroidism of renal origin: Secondary | ICD-10-CM | POA: Diagnosis not present

## 2015-12-22 DIAGNOSIS — D631 Anemia in chronic kidney disease: Secondary | ICD-10-CM | POA: Diagnosis not present

## 2015-12-22 DIAGNOSIS — E1129 Type 2 diabetes mellitus with other diabetic kidney complication: Secondary | ICD-10-CM | POA: Diagnosis not present

## 2015-12-22 DIAGNOSIS — N186 End stage renal disease: Secondary | ICD-10-CM | POA: Diagnosis not present

## 2015-12-23 ENCOUNTER — Encounter: Payer: Self-pay | Admitting: Family Medicine

## 2015-12-24 ENCOUNTER — Encounter: Payer: Self-pay | Admitting: Family Medicine

## 2015-12-24 DIAGNOSIS — N2581 Secondary hyperparathyroidism of renal origin: Secondary | ICD-10-CM | POA: Diagnosis not present

## 2015-12-24 DIAGNOSIS — M810 Age-related osteoporosis without current pathological fracture: Secondary | ICD-10-CM | POA: Insufficient documentation

## 2015-12-24 DIAGNOSIS — D509 Iron deficiency anemia, unspecified: Secondary | ICD-10-CM | POA: Diagnosis not present

## 2015-12-24 DIAGNOSIS — D631 Anemia in chronic kidney disease: Secondary | ICD-10-CM | POA: Diagnosis not present

## 2015-12-24 DIAGNOSIS — E1129 Type 2 diabetes mellitus with other diabetic kidney complication: Secondary | ICD-10-CM | POA: Diagnosis not present

## 2015-12-24 DIAGNOSIS — N186 End stage renal disease: Secondary | ICD-10-CM | POA: Diagnosis not present

## 2015-12-24 DIAGNOSIS — Z23 Encounter for immunization: Secondary | ICD-10-CM | POA: Diagnosis not present

## 2015-12-26 ENCOUNTER — Ambulatory Visit (INDEPENDENT_AMBULATORY_CARE_PROVIDER_SITE_OTHER): Payer: Medicare Other

## 2015-12-26 DIAGNOSIS — N186 End stage renal disease: Secondary | ICD-10-CM | POA: Diagnosis not present

## 2015-12-26 DIAGNOSIS — E1129 Type 2 diabetes mellitus with other diabetic kidney complication: Secondary | ICD-10-CM | POA: Diagnosis not present

## 2015-12-26 DIAGNOSIS — Z23 Encounter for immunization: Secondary | ICD-10-CM | POA: Diagnosis not present

## 2015-12-26 DIAGNOSIS — D509 Iron deficiency anemia, unspecified: Secondary | ICD-10-CM | POA: Diagnosis not present

## 2015-12-26 DIAGNOSIS — D631 Anemia in chronic kidney disease: Secondary | ICD-10-CM | POA: Diagnosis not present

## 2015-12-26 DIAGNOSIS — N2581 Secondary hyperparathyroidism of renal origin: Secondary | ICD-10-CM | POA: Diagnosis not present

## 2015-12-26 NOTE — Progress Notes (Signed)
Pt came in today for a PNA vaccine. Per health maintenance, pt was due for a Prevnar 13.   Additionally, pt wanted to a copy of bone density scan report. Pt was verbally given instructions that PCP had provided on report.

## 2015-12-27 DIAGNOSIS — N186 End stage renal disease: Secondary | ICD-10-CM | POA: Diagnosis not present

## 2015-12-27 DIAGNOSIS — E1129 Type 2 diabetes mellitus with other diabetic kidney complication: Secondary | ICD-10-CM | POA: Diagnosis not present

## 2015-12-27 DIAGNOSIS — Z992 Dependence on renal dialysis: Secondary | ICD-10-CM | POA: Diagnosis not present

## 2015-12-29 DIAGNOSIS — N186 End stage renal disease: Secondary | ICD-10-CM | POA: Diagnosis not present

## 2015-12-29 DIAGNOSIS — D631 Anemia in chronic kidney disease: Secondary | ICD-10-CM | POA: Diagnosis not present

## 2015-12-29 DIAGNOSIS — E1129 Type 2 diabetes mellitus with other diabetic kidney complication: Secondary | ICD-10-CM | POA: Diagnosis not present

## 2015-12-29 DIAGNOSIS — N2581 Secondary hyperparathyroidism of renal origin: Secondary | ICD-10-CM | POA: Diagnosis not present

## 2015-12-31 DIAGNOSIS — N2581 Secondary hyperparathyroidism of renal origin: Secondary | ICD-10-CM | POA: Diagnosis not present

## 2015-12-31 DIAGNOSIS — E1129 Type 2 diabetes mellitus with other diabetic kidney complication: Secondary | ICD-10-CM | POA: Diagnosis not present

## 2015-12-31 DIAGNOSIS — N186 End stage renal disease: Secondary | ICD-10-CM | POA: Diagnosis not present

## 2015-12-31 DIAGNOSIS — D631 Anemia in chronic kidney disease: Secondary | ICD-10-CM | POA: Diagnosis not present

## 2016-01-02 DIAGNOSIS — N186 End stage renal disease: Secondary | ICD-10-CM | POA: Diagnosis not present

## 2016-01-02 DIAGNOSIS — E1129 Type 2 diabetes mellitus with other diabetic kidney complication: Secondary | ICD-10-CM | POA: Diagnosis not present

## 2016-01-02 DIAGNOSIS — N2581 Secondary hyperparathyroidism of renal origin: Secondary | ICD-10-CM | POA: Diagnosis not present

## 2016-01-02 DIAGNOSIS — D631 Anemia in chronic kidney disease: Secondary | ICD-10-CM | POA: Diagnosis not present

## 2016-01-05 DIAGNOSIS — N186 End stage renal disease: Secondary | ICD-10-CM | POA: Diagnosis not present

## 2016-01-05 DIAGNOSIS — D631 Anemia in chronic kidney disease: Secondary | ICD-10-CM | POA: Diagnosis not present

## 2016-01-05 DIAGNOSIS — E1129 Type 2 diabetes mellitus with other diabetic kidney complication: Secondary | ICD-10-CM | POA: Diagnosis not present

## 2016-01-05 DIAGNOSIS — N2581 Secondary hyperparathyroidism of renal origin: Secondary | ICD-10-CM | POA: Diagnosis not present

## 2016-01-07 DIAGNOSIS — E1129 Type 2 diabetes mellitus with other diabetic kidney complication: Secondary | ICD-10-CM | POA: Diagnosis not present

## 2016-01-07 DIAGNOSIS — N2581 Secondary hyperparathyroidism of renal origin: Secondary | ICD-10-CM | POA: Diagnosis not present

## 2016-01-07 DIAGNOSIS — D631 Anemia in chronic kidney disease: Secondary | ICD-10-CM | POA: Diagnosis not present

## 2016-01-07 DIAGNOSIS — N186 End stage renal disease: Secondary | ICD-10-CM | POA: Diagnosis not present

## 2016-01-09 ENCOUNTER — Telehealth: Payer: Self-pay

## 2016-01-09 DIAGNOSIS — E1129 Type 2 diabetes mellitus with other diabetic kidney complication: Secondary | ICD-10-CM | POA: Diagnosis not present

## 2016-01-09 DIAGNOSIS — D631 Anemia in chronic kidney disease: Secondary | ICD-10-CM | POA: Diagnosis not present

## 2016-01-09 DIAGNOSIS — N186 End stage renal disease: Secondary | ICD-10-CM | POA: Diagnosis not present

## 2016-01-09 DIAGNOSIS — N2581 Secondary hyperparathyroidism of renal origin: Secondary | ICD-10-CM | POA: Diagnosis not present

## 2016-01-09 NOTE — Telephone Encounter (Signed)
Pt left v/m; pt last seen 12/18/15; had laceration glued and pt though glue was supposed to come off by now. Glue is still there; pt wants to know if that is OK or what to do? pt request cb.

## 2016-01-09 NOTE — Telephone Encounter (Signed)
Let's start washing area with soapy water and gently rubbing every day.  If no improvement, may try alcohol to get some glue off.  Should fall off little by little.

## 2016-01-09 NOTE — Telephone Encounter (Signed)
Patient notified

## 2016-01-12 DIAGNOSIS — N186 End stage renal disease: Secondary | ICD-10-CM | POA: Diagnosis not present

## 2016-01-12 DIAGNOSIS — N2581 Secondary hyperparathyroidism of renal origin: Secondary | ICD-10-CM | POA: Diagnosis not present

## 2016-01-12 DIAGNOSIS — E1129 Type 2 diabetes mellitus with other diabetic kidney complication: Secondary | ICD-10-CM | POA: Diagnosis not present

## 2016-01-12 DIAGNOSIS — D631 Anemia in chronic kidney disease: Secondary | ICD-10-CM | POA: Diagnosis not present

## 2016-01-14 DIAGNOSIS — N186 End stage renal disease: Secondary | ICD-10-CM | POA: Diagnosis not present

## 2016-01-14 DIAGNOSIS — D631 Anemia in chronic kidney disease: Secondary | ICD-10-CM | POA: Diagnosis not present

## 2016-01-14 DIAGNOSIS — E1129 Type 2 diabetes mellitus with other diabetic kidney complication: Secondary | ICD-10-CM | POA: Diagnosis not present

## 2016-01-14 DIAGNOSIS — N2581 Secondary hyperparathyroidism of renal origin: Secondary | ICD-10-CM | POA: Diagnosis not present

## 2016-01-16 DIAGNOSIS — N186 End stage renal disease: Secondary | ICD-10-CM | POA: Diagnosis not present

## 2016-01-16 DIAGNOSIS — N2581 Secondary hyperparathyroidism of renal origin: Secondary | ICD-10-CM | POA: Diagnosis not present

## 2016-01-16 DIAGNOSIS — E1129 Type 2 diabetes mellitus with other diabetic kidney complication: Secondary | ICD-10-CM | POA: Diagnosis not present

## 2016-01-16 DIAGNOSIS — D631 Anemia in chronic kidney disease: Secondary | ICD-10-CM | POA: Diagnosis not present

## 2016-01-21 DIAGNOSIS — N186 End stage renal disease: Secondary | ICD-10-CM | POA: Diagnosis not present

## 2016-01-21 DIAGNOSIS — E1129 Type 2 diabetes mellitus with other diabetic kidney complication: Secondary | ICD-10-CM | POA: Diagnosis not present

## 2016-01-21 DIAGNOSIS — D631 Anemia in chronic kidney disease: Secondary | ICD-10-CM | POA: Diagnosis not present

## 2016-01-21 DIAGNOSIS — N2581 Secondary hyperparathyroidism of renal origin: Secondary | ICD-10-CM | POA: Diagnosis not present

## 2016-01-23 DIAGNOSIS — E1129 Type 2 diabetes mellitus with other diabetic kidney complication: Secondary | ICD-10-CM | POA: Diagnosis not present

## 2016-01-23 DIAGNOSIS — N2581 Secondary hyperparathyroidism of renal origin: Secondary | ICD-10-CM | POA: Diagnosis not present

## 2016-01-23 DIAGNOSIS — D631 Anemia in chronic kidney disease: Secondary | ICD-10-CM | POA: Diagnosis not present

## 2016-01-23 DIAGNOSIS — N186 End stage renal disease: Secondary | ICD-10-CM | POA: Diagnosis not present

## 2016-01-26 DIAGNOSIS — N186 End stage renal disease: Secondary | ICD-10-CM | POA: Diagnosis not present

## 2016-01-26 DIAGNOSIS — D631 Anemia in chronic kidney disease: Secondary | ICD-10-CM | POA: Diagnosis not present

## 2016-01-26 DIAGNOSIS — N2581 Secondary hyperparathyroidism of renal origin: Secondary | ICD-10-CM | POA: Diagnosis not present

## 2016-01-26 DIAGNOSIS — E1129 Type 2 diabetes mellitus with other diabetic kidney complication: Secondary | ICD-10-CM | POA: Diagnosis not present

## 2016-01-27 DIAGNOSIS — N186 End stage renal disease: Secondary | ICD-10-CM | POA: Diagnosis not present

## 2016-01-27 DIAGNOSIS — Z992 Dependence on renal dialysis: Secondary | ICD-10-CM | POA: Diagnosis not present

## 2016-01-27 DIAGNOSIS — E1129 Type 2 diabetes mellitus with other diabetic kidney complication: Secondary | ICD-10-CM | POA: Diagnosis not present

## 2016-01-28 DIAGNOSIS — E1129 Type 2 diabetes mellitus with other diabetic kidney complication: Secondary | ICD-10-CM | POA: Diagnosis not present

## 2016-01-28 DIAGNOSIS — N2581 Secondary hyperparathyroidism of renal origin: Secondary | ICD-10-CM | POA: Diagnosis not present

## 2016-01-28 DIAGNOSIS — N186 End stage renal disease: Secondary | ICD-10-CM | POA: Diagnosis not present

## 2016-01-28 DIAGNOSIS — D631 Anemia in chronic kidney disease: Secondary | ICD-10-CM | POA: Diagnosis not present

## 2016-01-30 DIAGNOSIS — N186 End stage renal disease: Secondary | ICD-10-CM | POA: Diagnosis not present

## 2016-01-30 DIAGNOSIS — N2581 Secondary hyperparathyroidism of renal origin: Secondary | ICD-10-CM | POA: Diagnosis not present

## 2016-01-30 DIAGNOSIS — D631 Anemia in chronic kidney disease: Secondary | ICD-10-CM | POA: Diagnosis not present

## 2016-01-30 DIAGNOSIS — E1129 Type 2 diabetes mellitus with other diabetic kidney complication: Secondary | ICD-10-CM | POA: Diagnosis not present

## 2016-02-02 DIAGNOSIS — N2581 Secondary hyperparathyroidism of renal origin: Secondary | ICD-10-CM | POA: Diagnosis not present

## 2016-02-02 DIAGNOSIS — N186 End stage renal disease: Secondary | ICD-10-CM | POA: Diagnosis not present

## 2016-02-02 DIAGNOSIS — E1129 Type 2 diabetes mellitus with other diabetic kidney complication: Secondary | ICD-10-CM | POA: Diagnosis not present

## 2016-02-02 DIAGNOSIS — D631 Anemia in chronic kidney disease: Secondary | ICD-10-CM | POA: Diagnosis not present

## 2016-02-04 DIAGNOSIS — N186 End stage renal disease: Secondary | ICD-10-CM | POA: Diagnosis not present

## 2016-02-04 DIAGNOSIS — D631 Anemia in chronic kidney disease: Secondary | ICD-10-CM | POA: Diagnosis not present

## 2016-02-04 DIAGNOSIS — N2581 Secondary hyperparathyroidism of renal origin: Secondary | ICD-10-CM | POA: Diagnosis not present

## 2016-02-04 DIAGNOSIS — E1129 Type 2 diabetes mellitus with other diabetic kidney complication: Secondary | ICD-10-CM | POA: Diagnosis not present

## 2016-02-06 DIAGNOSIS — D631 Anemia in chronic kidney disease: Secondary | ICD-10-CM | POA: Diagnosis not present

## 2016-02-06 DIAGNOSIS — E1129 Type 2 diabetes mellitus with other diabetic kidney complication: Secondary | ICD-10-CM | POA: Diagnosis not present

## 2016-02-06 DIAGNOSIS — N186 End stage renal disease: Secondary | ICD-10-CM | POA: Diagnosis not present

## 2016-02-06 DIAGNOSIS — N2581 Secondary hyperparathyroidism of renal origin: Secondary | ICD-10-CM | POA: Diagnosis not present

## 2016-02-09 DIAGNOSIS — D631 Anemia in chronic kidney disease: Secondary | ICD-10-CM | POA: Diagnosis not present

## 2016-02-09 DIAGNOSIS — E1129 Type 2 diabetes mellitus with other diabetic kidney complication: Secondary | ICD-10-CM | POA: Diagnosis not present

## 2016-02-09 DIAGNOSIS — N2581 Secondary hyperparathyroidism of renal origin: Secondary | ICD-10-CM | POA: Diagnosis not present

## 2016-02-09 DIAGNOSIS — N186 End stage renal disease: Secondary | ICD-10-CM | POA: Diagnosis not present

## 2016-02-11 DIAGNOSIS — D631 Anemia in chronic kidney disease: Secondary | ICD-10-CM | POA: Diagnosis not present

## 2016-02-11 DIAGNOSIS — N2581 Secondary hyperparathyroidism of renal origin: Secondary | ICD-10-CM | POA: Diagnosis not present

## 2016-02-11 DIAGNOSIS — E1129 Type 2 diabetes mellitus with other diabetic kidney complication: Secondary | ICD-10-CM | POA: Diagnosis not present

## 2016-02-11 DIAGNOSIS — N186 End stage renal disease: Secondary | ICD-10-CM | POA: Diagnosis not present

## 2016-02-13 DIAGNOSIS — D631 Anemia in chronic kidney disease: Secondary | ICD-10-CM | POA: Diagnosis not present

## 2016-02-13 DIAGNOSIS — N2581 Secondary hyperparathyroidism of renal origin: Secondary | ICD-10-CM | POA: Diagnosis not present

## 2016-02-13 DIAGNOSIS — N186 End stage renal disease: Secondary | ICD-10-CM | POA: Diagnosis not present

## 2016-02-13 DIAGNOSIS — E1129 Type 2 diabetes mellitus with other diabetic kidney complication: Secondary | ICD-10-CM | POA: Diagnosis not present

## 2016-02-15 DIAGNOSIS — N2581 Secondary hyperparathyroidism of renal origin: Secondary | ICD-10-CM | POA: Diagnosis not present

## 2016-02-15 DIAGNOSIS — D631 Anemia in chronic kidney disease: Secondary | ICD-10-CM | POA: Diagnosis not present

## 2016-02-15 DIAGNOSIS — E1129 Type 2 diabetes mellitus with other diabetic kidney complication: Secondary | ICD-10-CM | POA: Diagnosis not present

## 2016-02-15 DIAGNOSIS — N186 End stage renal disease: Secondary | ICD-10-CM | POA: Diagnosis not present

## 2016-02-16 DIAGNOSIS — N186 End stage renal disease: Secondary | ICD-10-CM | POA: Diagnosis not present

## 2016-02-16 DIAGNOSIS — E1129 Type 2 diabetes mellitus with other diabetic kidney complication: Secondary | ICD-10-CM | POA: Diagnosis not present

## 2016-02-16 DIAGNOSIS — N2581 Secondary hyperparathyroidism of renal origin: Secondary | ICD-10-CM | POA: Diagnosis not present

## 2016-02-16 DIAGNOSIS — D631 Anemia in chronic kidney disease: Secondary | ICD-10-CM | POA: Diagnosis not present

## 2016-02-17 DIAGNOSIS — N2581 Secondary hyperparathyroidism of renal origin: Secondary | ICD-10-CM | POA: Diagnosis not present

## 2016-02-17 DIAGNOSIS — E1129 Type 2 diabetes mellitus with other diabetic kidney complication: Secondary | ICD-10-CM | POA: Diagnosis not present

## 2016-02-17 DIAGNOSIS — D631 Anemia in chronic kidney disease: Secondary | ICD-10-CM | POA: Diagnosis not present

## 2016-02-17 DIAGNOSIS — N186 End stage renal disease: Secondary | ICD-10-CM | POA: Diagnosis not present

## 2016-02-20 DIAGNOSIS — D631 Anemia in chronic kidney disease: Secondary | ICD-10-CM | POA: Diagnosis not present

## 2016-02-20 DIAGNOSIS — N2581 Secondary hyperparathyroidism of renal origin: Secondary | ICD-10-CM | POA: Diagnosis not present

## 2016-02-20 DIAGNOSIS — E1129 Type 2 diabetes mellitus with other diabetic kidney complication: Secondary | ICD-10-CM | POA: Diagnosis not present

## 2016-02-20 DIAGNOSIS — N186 End stage renal disease: Secondary | ICD-10-CM | POA: Diagnosis not present

## 2016-02-23 DIAGNOSIS — N2581 Secondary hyperparathyroidism of renal origin: Secondary | ICD-10-CM | POA: Diagnosis not present

## 2016-02-23 DIAGNOSIS — D631 Anemia in chronic kidney disease: Secondary | ICD-10-CM | POA: Diagnosis not present

## 2016-02-23 DIAGNOSIS — E1129 Type 2 diabetes mellitus with other diabetic kidney complication: Secondary | ICD-10-CM | POA: Diagnosis not present

## 2016-02-23 DIAGNOSIS — N186 End stage renal disease: Secondary | ICD-10-CM | POA: Diagnosis not present

## 2016-02-25 ENCOUNTER — Telehealth: Payer: Self-pay | Admitting: Gastroenterology

## 2016-02-25 DIAGNOSIS — D631 Anemia in chronic kidney disease: Secondary | ICD-10-CM | POA: Diagnosis not present

## 2016-02-25 DIAGNOSIS — N2581 Secondary hyperparathyroidism of renal origin: Secondary | ICD-10-CM | POA: Diagnosis not present

## 2016-02-25 DIAGNOSIS — E1129 Type 2 diabetes mellitus with other diabetic kidney complication: Secondary | ICD-10-CM | POA: Diagnosis not present

## 2016-02-25 DIAGNOSIS — N186 End stage renal disease: Secondary | ICD-10-CM | POA: Diagnosis not present

## 2016-02-26 ENCOUNTER — Ambulatory Visit (INDEPENDENT_AMBULATORY_CARE_PROVIDER_SITE_OTHER)
Admission: RE | Admit: 2016-02-26 | Discharge: 2016-02-26 | Disposition: A | Discharge status: Home / Self Care | Payer: Medicare Other | Source: Ambulatory Visit | Attending: Family Medicine | Admitting: Family Medicine

## 2016-02-26 ENCOUNTER — Encounter: Payer: Self-pay | Admitting: Family Medicine

## 2016-02-26 ENCOUNTER — Ambulatory Visit (INDEPENDENT_AMBULATORY_CARE_PROVIDER_SITE_OTHER): Payer: Medicare Other | Admitting: Family Medicine

## 2016-02-26 VITALS — BP 122/60 | HR 72 | Temp 97.4°F | Wt 117.0 lb

## 2016-02-26 DIAGNOSIS — M545 Low back pain, unspecified: Secondary | ICD-10-CM

## 2016-02-26 DIAGNOSIS — K31819 Angiodysplasia of stomach and duodenum without bleeding: Secondary | ICD-10-CM

## 2016-02-26 DIAGNOSIS — N186 End stage renal disease: Secondary | ICD-10-CM | POA: Diagnosis not present

## 2016-02-26 DIAGNOSIS — G8929 Other chronic pain: Secondary | ICD-10-CM | POA: Diagnosis not present

## 2016-02-26 DIAGNOSIS — R0609 Other forms of dyspnea: Secondary | ICD-10-CM

## 2016-02-26 DIAGNOSIS — Z992 Dependence on renal dialysis: Secondary | ICD-10-CM | POA: Diagnosis not present

## 2016-02-26 DIAGNOSIS — I2510 Atherosclerotic heart disease of native coronary artery without angina pectoris: Secondary | ICD-10-CM

## 2016-02-26 DIAGNOSIS — E1129 Type 2 diabetes mellitus with other diabetic kidney complication: Secondary | ICD-10-CM | POA: Diagnosis not present

## 2016-02-26 DIAGNOSIS — D631 Anemia in chronic kidney disease: Secondary | ICD-10-CM | POA: Diagnosis not present

## 2016-02-26 DIAGNOSIS — M810 Age-related osteoporosis without current pathological fracture: Secondary | ICD-10-CM | POA: Diagnosis not present

## 2016-02-26 MED ORDER — TRAMADOL HCL 50 MG PO TABS: 25.0000 mg | ORAL_TABLET | Freq: Two times a day (BID) | ORAL | 0 refills | Status: DC | PRN

## 2016-02-26 NOTE — Progress Notes (Signed)
BP 122/60   Pulse 72   Temp 97.4 F (36.3 C) (Oral)   Wt 117 lb (53.1 kg)   BMI 25.32 kg/m    CC: back pain Subjective:    Patient ID: [name], female    DOB: 1931/12/28, 82 y.o.   MRN: [id number]  HPI: [name] is a 82 y.o. female presenting on 02/26/2016 for Back Pain (LBP; tylenol no help; chronic--getting worse--nephro said "no" to prolia) and Anemia ((Hgb 9) feels bad)   Noticing worsening lower back pain worse with prolonged standing and after dialysis prolonged sitting. Known osteoporosis, known chronic T11 compression fracture. Takes tylenol 1000mg  bid without significant improvement. Pt states renal recommended against prolia. Saw chiropractor which helped some. Notices she walks better when leaning on cart.   Known ESRD on HD with GAVE leading to chronic anemia s/p multiple EGD with APCs. Has been scheduled for rpt surveillance EGD in 6 months. Known severe 3v CAD, managed medically. rec transfusion as needed due to ischemia. rec transfuse if Hgb <9.   Last Hgb 9.6 (10/302017). Pt states last Hgb 9 this week. Received iron infusion yesterday. Also receiving monthly injection (anticipate procit or similar). She completed fecal occult stool test through dialysis center, states this will be faxed to GI.   Sunday while putting up christmas tree, felt lightheaded and some paresthesias, as well as marked exertional dyspnea this lasted several hours. Now she is feeling better. Denies palpitations, chest pain, leg swelling, cough.   Relevant past medical, surgical, family and social history reviewed and updated as indicated. Interim medical history since our last visit reviewed. Allergies and medications reviewed and updated. Current Outpatient Prescriptions on File Prior to Visit  Medication Sig  . acetaminophen (TYLENOL) 500 MG tablet Take 1,000 mg by mouth every 6 (six) hours as needed for moderate pain.  Marland Kitchen amLODipine (NORVASC) 2.5 MG tablet Take 2.5 mg by mouth at  bedtime.  . Artificial Tear Ointment (REFRESH P.M. OP) Place 1 application into the right eye daily.  Marland Kitchen aspirin EC 81 MG tablet Take 1 tablet (81 mg total) by mouth daily.  Marland Kitchen atorvastatin (LIPITOR) 80 MG tablet Take 80 mg by mouth at bedtime.  . B Complex-C-Folic Acid (RENA-VITE PO) Take 1 tablet by mouth daily with breakfast.   . Calcium Carbonate Antacid (TUMS ULTRA 1000 PO) Take 3,000 mg by mouth 3 (three) times daily after meals.   . cholecalciferol (VITAMIN D) 1000 UNITS tablet Take 1,000 Units by mouth every Monday, Wednesday, and Friday.   . fexofenadine (ALLEGRA) 180 MG tablet Take 180 mg by mouth daily.   . metoprolol tartrate (LOPRESSOR) 25 MG tablet Take 0.5 tablets (12.5 mg total) by mouth 2 (two) times daily.  . pantoprazole (PROTONIX) 40 MG tablet TAKE 1 TABLET BY MOUTH TWICE A DAY (Patient taking differently: TAKE 1 TABLET BY MOUTH ONCE IN THE MORNING)   No current facility-administered medications on file prior to visit.     Review of Systems Per HPI unless specifically indicated in ROS section     Objective:    BP 122/60   Pulse 72   Temp 97.4 F (36.3 C) (Oral)   Wt 117 lb (53.1 kg)   BMI 25.32 kg/m   Wt Readings from Last 3 Encounters:  02/26/16 117 lb (53.1 kg)  12/18/15 116 lb (52.6 kg)  12/16/15 118 lb 8 oz (53.8 kg)    Physical Exam  Constitutional: She appears well-developed and well-nourished. No distress.  HENT:  Mouth/Throat: Oropharynx is clear and moist. No oropharyngeal exudate.  Cardiovascular: Normal rate, regular rhythm, normal heart sounds and intact distal pulses.   No murmur heard. Referred flow heard from LUE AVF  Pulmonary/Chest: Effort normal and breath sounds normal. No respiratory distress. She has no wheezes. She has no rales.  Musculoskeletal: She exhibits no edema.  Neurological:  Chronic congenital R facial droop  Skin: Skin is warm and dry. No rash noted.  Psychiatric: She has a normal mood and affect.  Nursing note and vitals  reviewed.  Results for orders placed or performed during the hospital encounter of 11/03/15  Potassium [facility] vascular lab only)  Result Value Ref Range   Potassium [facility] vascular lab) 3.8 3.5 - 5.1      Assessment & Plan:   Problem List Items Addressed This Visit    Anemia in chronic kidney disease    Received iron infusion yesterday. Hgb remains >9. rec transfusion if <9. Advised await iron infusion effect.       Chronic midline low back pain without sciatica - Primary    Known OP and chronic compression fracture T11.  Ongoing lower back pain not responsive to tylenol - will trial tramadol, start at 1/4 tablet. Discussed sedation precautions.  Will update lumbar films today as well.      Relevant Medications   traMADol (ULTRAM) 50 MG tablet   Other Relevant Orders   DG Lumbar Spine Complete   End stage renal disease on dialysis ([place])    HD MWF.       Exertional dyspnea    Recent episode of exertional dyspnea associated with lightheadedness. Possible tachycardia although pt denies palpitations, possible CHF exacerbation although seems euvolemic currently. Advised notify us or cardiology of recurrent episode for further evaluation.      GAVE (gastric antral vascular ectasia)    Continue daily PPI.      Osteoporosis       Follow up plan: Return if symptoms worsen or fail to improve.  [name], MD

## 2016-02-26 NOTE — Progress Notes (Signed)
Pre visit review using our clinic review tool, if applicable. No additional management support is needed unless otherwise documented below in the visit note. 

## 2016-02-26 NOTE — Assessment & Plan Note (Signed)
Known OP and chronic compression fracture T11.  Ongoing lower back pain not responsive to tylenol - will trial tramadol, start at 1/4 tablet. Discussed sedation precautions.  Will update lumbar films today as well.

## 2016-02-26 NOTE — Assessment & Plan Note (Signed)
Received iron infusion yesterday. Hgb remains >9. rec transfusion if <9. Advised await iron infusion effect.

## 2016-02-26 NOTE — Assessment & Plan Note (Signed)
HD MWF °

## 2016-02-26 NOTE — Patient Instructions (Addendum)
Continue tylenol 1000mg  tiwce daily.  Trial tramadol for breakthrough pain - start at 1/4 tablet first to see how tolerated, then may increase to 1/2-1 tablet as needed. Don't take medicine and drive.  Lumbar xrays today.

## 2016-02-26 NOTE — Assessment & Plan Note (Signed)
Recent episode of exertional dyspnea associated with lightheadedness. Possible tachycardia although pt denies palpitations, possible CHF exacerbation although seems euvolemic currently. Advised notify us or cardiology of recurrent episode for further evaluation.

## 2016-02-26 NOTE — Assessment & Plan Note (Signed)
Continue daily PPI.  

## 2016-02-27 DIAGNOSIS — E782 Mixed hyperlipidemia: Secondary | ICD-10-CM | POA: Diagnosis not present

## 2016-02-27 DIAGNOSIS — N186 End stage renal disease: Secondary | ICD-10-CM | POA: Diagnosis not present

## 2016-02-27 DIAGNOSIS — E1129 Type 2 diabetes mellitus with other diabetic kidney complication: Secondary | ICD-10-CM | POA: Diagnosis not present

## 2016-02-27 DIAGNOSIS — N2581 Secondary hyperparathyroidism of renal origin: Secondary | ICD-10-CM | POA: Diagnosis not present

## 2016-02-27 DIAGNOSIS — D631 Anemia in chronic kidney disease: Secondary | ICD-10-CM | POA: Diagnosis not present

## 2016-03-01 DIAGNOSIS — E1129 Type 2 diabetes mellitus with other diabetic kidney complication: Secondary | ICD-10-CM | POA: Diagnosis not present

## 2016-03-01 DIAGNOSIS — N186 End stage renal disease: Secondary | ICD-10-CM | POA: Diagnosis not present

## 2016-03-01 DIAGNOSIS — D631 Anemia in chronic kidney disease: Secondary | ICD-10-CM | POA: Diagnosis not present

## 2016-03-01 DIAGNOSIS — N2581 Secondary hyperparathyroidism of renal origin: Secondary | ICD-10-CM | POA: Diagnosis not present

## 2016-03-01 DIAGNOSIS — E782 Mixed hyperlipidemia: Secondary | ICD-10-CM | POA: Diagnosis not present

## 2016-03-03 DIAGNOSIS — E1129 Type 2 diabetes mellitus with other diabetic kidney complication: Secondary | ICD-10-CM | POA: Diagnosis not present

## 2016-03-03 DIAGNOSIS — D631 Anemia in chronic kidney disease: Secondary | ICD-10-CM | POA: Diagnosis not present

## 2016-03-03 DIAGNOSIS — N2581 Secondary hyperparathyroidism of renal origin: Secondary | ICD-10-CM | POA: Diagnosis not present

## 2016-03-03 DIAGNOSIS — E782 Mixed hyperlipidemia: Secondary | ICD-10-CM | POA: Diagnosis not present

## 2016-03-03 DIAGNOSIS — N186 End stage renal disease: Secondary | ICD-10-CM | POA: Diagnosis not present

## 2016-03-04 ENCOUNTER — Encounter: Payer: Self-pay | Admitting: Physician Assistant

## 2016-03-04 ENCOUNTER — Ambulatory Visit (INDEPENDENT_AMBULATORY_CARE_PROVIDER_SITE_OTHER): Payer: Medicare Other | Admitting: Physician Assistant

## 2016-03-04 VITALS — BP 110/50 | HR 72 | Ht <= 58 in | Wt 116.2 lb

## 2016-03-04 DIAGNOSIS — I2510 Atherosclerotic heart disease of native coronary artery without angina pectoris: Secondary | ICD-10-CM | POA: Diagnosis not present

## 2016-03-04 DIAGNOSIS — D500 Iron deficiency anemia secondary to blood loss (chronic): Secondary | ICD-10-CM

## 2016-03-04 DIAGNOSIS — K31819 Angiodysplasia of stomach and duodenum without bleeding: Secondary | ICD-10-CM | POA: Diagnosis not present

## 2016-03-04 NOTE — Patient Instructions (Signed)
We will call you with a date for the procedure at [facility] with Dr. Silverio Decamp.

## 2016-03-04 NOTE — Progress Notes (Signed)
,   Chief Complaint: IDA, Hemoccult positive stools, GAVE  HPI:  Mrs. Luciano is an 82 year old Caucasian female with history of GAVE status post multiple prior EGDs with APC, the last in May 2017 which showed significant improvement in the gastric antral vascular ectasia, the patient only had a few residual lesions which were treated with APC. At that time her hemoglobin was still 9-10.   Most recently the patient tells me that her hemoglobin has dropped to around 9.2 from 11/10 in April of this year. She knows this because it is checked every Monday Wednesday and Friday at hemodialysis. The patient tells me that along with the drop in hemoglobin she has been experiencing some dizziness upon standing and shortness of breath. She tells me she had Hemoccult studies done and these were positive a week ago. She has had 2 iron infusions recently. The patient tells me that she was scheduled to have a screening EGD in January and would like to move this sooner due to current symptoms. Apparently these have been completed in the hospital before, but the patient is unsure why.   Patient's past medical history is significant for CAD with known three-vessel disease via cath in February, cardiac murmur, end-stage renal disease on dialysis every Monday Wednesday and Friday, facial droop, GERD, hyperlipidemia, hypertension and osteoporosis.   Patient denies fever, chills, seeing bright red blood or black tarry sticky stools, weight loss, anorexia, nausea, vomiting, heartburn, reflux, abdominal pain or syncope.  Past Medical History:  Diagnosis Date  . Anemia in chronic kidney disease    IV iron infusion and anemia from GAVE blood loss.  . Arthritis     hands  . Bradycardia 2014   a. Noted 06/2012.  . Breast cancer ([place])    Left Breast 13 yrs. ago  . CAD (coronary artery disease), native coronary artery    3 vessel CAD by cath 04/2015  . Cardiac murmur    a. thought due to AVF (2D echo 06/2012 without  significant valvular disease).   . Ectropion of right lower eyelid 11/2012   s/p surgery (Dr. [name])  . End stage renal disease on dialysis [facility])    HD M,W,F [place] AVF now LUE AVF  . Facial droop 1935   acquired during forceps delivery, some residual R visual loss  . Family history of adverse reaction to anesthesia    brother trouble waking up and confusion, now deceased  . GAVE (gastric antral vascular ectasia) 08/2014   s/p EGD with APC  . GERD (gastroesophageal reflux disease)   . Hearing loss    Bilateral hearing aids  . History of blood transfusion 5 weeks ago  . Hx of breast cancer 2004  . Hyperlipidemia   . Hypertension   . Hypotension    a. Associated w/ dialysis.  . Osteoporosis 11/2013   DEXA -3.4 radius  . Sight impaired    Left: wears contact. Right: diminished peripheral vision    Past Surgical History:  Procedure Laterality Date  . AV FISTULA PLACEMENT  07/20/10   Right brachiocephalic AVF  . BASCILIC VEIN TRANSPOSITION Left 07/24/2013   Procedure: BASILIC VEIN TRANSPOSITION;  Surgeon: [name], MD;  Location: [place];  Service: Vascular;  Laterality: Left;  . BREAST BIOPSY Left   . BREAST LUMPECTOMY Left   . CARDIAC CATHETERIZATION N/A 04/10/2015   Procedure: Left Heart Cath and Coronary Angiography;  Surgeon: [name], MD;  Location: [place] CV LAB;  Service:  Cardiovascular;  Laterality: N/A;  . CATARACT EXTRACTION Bilateral 1980  . DEXA  11/2015   DEXA T -4.3 [facility]  . ESOPHAGOGASTRODUODENOSCOPY N/A 04/09/2014   Procedure: ESOPHAGOGASTRODUODENOSCOPY (EGD);  Surgeon: [name], MD;  Location: [place];  Service: Endoscopy;  Laterality: N/A;  . ESOPHAGOGASTRODUODENOSCOPY N/A 09/23/2014   Procedure: ESOPHAGOGASTRODUODENOSCOPY (EGD);  Surgeon: [name], MD;  Location: [place];  Service: Endoscopy;  Laterality: N/A;  . ESOPHAGOGASTRODUODENOSCOPY N/A 12/26/2014   neg H pylori, benign biopsies;  ESOPHAGOGASTRODUODENOSCOPY (EGD);  Surgeon: [name], MD  . ESOPHAGOGASTRODUODENOSCOPY N/A 06/15/2015   Procedure: ESOPHAGOGASTRODUODENOSCOPY (EGD);  Surgeon: Milus Banister, MD;  Location: [place];  Service: Endoscopy;  Laterality: N/A;  . ESOPHAGOGASTRODUODENOSCOPY N/A 06/27/2015   Procedure: ESOPHAGOGASTRODUODENOSCOPY (EGD);  Surgeon: Mauri Pole, MD;  Location: [facility];  Service: Endoscopy;  Laterality: N/A;  . ESOPHAGOGASTRODUODENOSCOPY (EGD) WITH PROPOFOL N/A 11/12/2014   Procedure: ESOPHAGOGASTRODUODENOSCOPY (EGD) WITH PROPOFOL;  Surgeon: [name], MD;  Location: [facility];  Service: Endoscopy;  Laterality: N/A;  . ESOPHAGOGASTRODUODENOSCOPY (EGD) WITH PROPOFOL N/A 07/31/2015   Procedure: ESOPHAGOGASTRODUODENOSCOPY (EGD) WITH PROPOFOL;  Surgeon: Mauri Pole, MD;  Location: [facility];  Service: Endoscopy;  Laterality: N/A;  . EYE SURGERY  1966   regular cataract  . HERNIA REPAIR    . HOT HEMOSTASIS N/A 11/12/2014   Procedure: HOT HEMOSTASIS (ARGON PLASMA COAGULATION/BICAP);  Surgeon: [name], MD;  Location: [facility];  Service: Endoscopy;  Laterality: N/A;  . HOT HEMOSTASIS N/A 06/27/2015   Procedure: HOT HEMOSTASIS (ARGON PLASMA COAGULATION/BICAP);  Surgeon: Mauri Pole, MD;  Location: [facility];  Service: Endoscopy;  Laterality: N/A;  . HOT HEMOSTASIS N/A 07/31/2015   Procedure: HOT HEMOSTASIS (ARGON PLASMA COAGULATION/BICAP);  Surgeon: Mauri Pole, MD;  Location: [facility];  Service: Endoscopy;  Laterality: N/A;  . PERIPHERAL VASCULAR CATHETERIZATION N/A 08/15/2014   Procedure: A/V Shuntogram/Fistulagram;  Surgeon: [name], MD;  Location: [place] CV LAB;  Service: Cardiovascular;  Laterality: N/A;  . PERIPHERAL VASCULAR CATHETERIZATION N/A 04/28/2015   Procedure: A/V Shuntogram/Fistulagram;  Surgeon: [name], MD;  Location: [place] CV LAB;  Service: Cardiovascular;  Laterality: N/A;  . PERIPHERAL  VASCULAR CATHETERIZATION Left 04/28/2015   Procedure: A/V Shunt Intervention;  Surgeon: [name], MD;  Location: [place] CV LAB;  Service: Cardiovascular;  Laterality: Left;  . PERIPHERAL VASCULAR CATHETERIZATION Left 08/07/2015   Procedure: A/V Shuntogram/Fistulagram;  Surgeon: [name], MD;  Location: [place] CV LAB;  Service: Cardiovascular;  Laterality: Left;  . PERIPHERAL VASCULAR CATHETERIZATION N/A 08/07/2015   Procedure: A/V Shunt Intervention;  Surgeon: [name], MD;  Location: [place] CV LAB;  Service: Cardiovascular;  Laterality: N/A;  . PERIPHERAL VASCULAR CATHETERIZATION Left 11/03/2015   Procedure: A/V Shuntogram/Fistulagram;  Surgeon: [name], MD;  Location: [place] CV LAB;  Service: Cardiovascular;  Laterality: Left;  . PERIPHERAL VASCULAR CATHETERIZATION N/A 11/03/2015   Procedure: A/V Shunt Intervention;  Surgeon: [name], MD;  Location: [place] CV LAB;  Service: Cardiovascular;  Laterality: N/A;  . SHUNTOGRAM N/A 04/16/2011   Procedure: [place];  Surgeon: [name], MD;  Location: [facility] CATH LAB;  Service: Cardiovascular;  Laterality: N/A;  . SHUNTOGRAM Right 07/05/2013   Procedure: FISTULOGRAM;  Surgeon: [name], MD;  Location: [facility] CATH LAB;  Service: Cardiovascular;  Laterality: Right;  . UMBILICAL HERNIA REPAIR  2011  . US ECHOCARDIOGRAPHY  06/2012   LVH, EF 65%, grade 1  diastol dysfunction, mod dliated LAD    Current Outpatient Prescriptions  Medication Sig Dispense Refill  . acetaminophen (TYLENOL) 500 MG tablet Take 1,000 mg by mouth every 6 (six) hours as needed for moderate pain.    Marland Kitchen amLODipine (NORVASC) 2.5 MG tablet Take 2.5 mg by mouth at bedtime.    . Artificial Tear Ointment (REFRESH P.M. OP) Place 1 application into the right eye daily.    Marland Kitchen aspirin EC 81 MG tablet Take 1 tablet (81 mg total) by mouth daily. 90 tablet 3  . atorvastatin (LIPITOR) 80 MG tablet Take 80 mg by mouth at bedtime.    . B  Complex-C-Folic Acid (RENA-VITE PO) Take 1 tablet by mouth daily with breakfast.     . Calcium Carbonate Antacid (TUMS ULTRA 1000 PO) Take 3,000 mg by mouth 3 (three) times daily after meals.     . cholecalciferol (VITAMIN D) 1000 UNITS tablet Take 1,000 Units by mouth every Monday, Wednesday, and Friday.     . fexofenadine (ALLEGRA) 180 MG tablet Take 180 mg by mouth daily.     . metoprolol tartrate (LOPRESSOR) 25 MG tablet Take 0.5 tablets (12.5 mg total) by mouth 2 (two) times daily. 60 tablet 11  . pantoprazole (PROTONIX) 40 MG tablet TAKE 1 TABLET BY MOUTH TWICE A DAY (Patient taking differently: TAKE 1 TABLET BY MOUTH ONCE IN THE MORNING) 60 tablet 3  . traMADol (ULTRAM) 50 MG tablet Take 0.5 tablets (25 mg total) by mouth 2 (two) times daily as needed for moderate pain. 20 tablet 0   No current facility-administered medications for this visit.     Allergies as of 03/04/2016 - Review Complete 03/04/2016  Allergen Reaction Noted  . Sulfa antibiotics Rash 12/14/2010    Family History  Problem Relation Age of Onset  . Cancer Father     stomach  . CAD Father     MI at age 69  . Heart disease Father   . Heart attack Father   . Hypertension Mother   . Heart disease Brother     Congenital  . Hypertension Brother   . Diabetes Brother   . Cancer Sister     female (uterus?)    Social History   Social History  . Marital status: Single    Spouse name: N/A  . Number of children: N/A  . Years of education: N/A   Occupational History  . 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 Smoking status: Former Smoker    Years: 2.00    Types: Cigarettes    Quit date: 03/29/1953  . Smokeless tobacco: Never Used  . Alcohol use 0.0 oz/week     Comment: occasional  . Drug use: No  . Sexual activity: No   Other Topics Concern  . Not on file   Social History Narrative   As of 03/2013. Lives alone with her small dog.   Bother and sister in law died last year.     Occupation: retired, was Information systems manager for [employer]   Edu: some college    Review of Systems:     Constitutional: Positive for fatigue No weight loss Cardiovascular: No chest pain Respiratory: Positive for daily No SOB or cough Gastrointestinal: See HPI and otherwise negative   Physical Exam:  Vital signs: BP (!) 110/50 (BP Location: Right Arm, Patient Position: Sitting, Cuff Size: Normal)   Pulse 72  Ht 4\' 9"  (1.448 m)   Wt 116 lb 4 oz (52.7 kg)   BMI 25.16 kg/m   Constitutional:   Pleasant Caucasian female appears to be in NAD, Well developed, Well nourished, alert and cooperative Head:  Facial asymmetry with missing right eye Eyes:   No icterus. Ears:  Normal auditory acuity. Neck:  Supple Throat: Oral cavity and pharynx without inflammation, swelling or lesion.  Respiratory: Respirations even and unlabored. Lungs clear to auscultation bilaterally.   No wheezes, crackles, or rhonchi.  Cardiovascular: Normal S1, S2. No MRG. Regular rate and rhythm. No peripheral edema, cyanosis or pallor.  Gastrointestinal:  Soft, nondistended, nontender. No rebound or guarding. Normal bowel sounds. No appreciable masses or hepatomegaly. Rectal:  Not performed.   RELEVANT LABS: We do not have any recent labs, patient tells me that her last hemoglobin was 9.2 on Friday, peroneal me this had dropped down to 8 something and is increased up to 9.2  Assessment: 1. IDA: Chronic for the patient with her end-stage renal disease and history of GAVE which had been stable over the past 6 months, patient now with recent drop in hemoglobin less than 9 and Hemoccult-positive stools, concern for bleeding 2. History of GAVE: Last EGD in May  Plan: 1. Recommend patient proceed with an EGD. This will be scheduled with Dr. Silverio Decamp. Will discuss timing with Dr. Silverio Decamp. Risks, benefits, limitations and alternatives of the procedure were discussed and  the patient agrees to proceed. These were previously performed in the hospital, unsure exactly why, will discuss with Dr. Silverio Decamp. 2. Patient to continue her Protonix 40 mg twice a day 3. Patient to proceed to the ER if she feels extremely dizzy, syncopal, or sees any bright red blood or black tarry sticky stools. She verbalizes understanding  Ellouise Newer, PA-C [facility] 03/04/2016, 2:03 PM  Cc: [name], MD

## 2016-03-05 ENCOUNTER — Encounter (HOSPITAL_COMMUNITY): Payer: Self-pay

## 2016-03-05 ENCOUNTER — Telehealth: Payer: Self-pay | Admitting: *Deleted

## 2016-03-05 ENCOUNTER — Other Ambulatory Visit: Payer: Self-pay | Admitting: *Deleted

## 2016-03-05 DIAGNOSIS — D649 Anemia, unspecified: Secondary | ICD-10-CM

## 2016-03-05 DIAGNOSIS — R195 Other fecal abnormalities: Secondary | ICD-10-CM

## 2016-03-05 DIAGNOSIS — N2581 Secondary hyperparathyroidism of renal origin: Secondary | ICD-10-CM | POA: Diagnosis not present

## 2016-03-05 DIAGNOSIS — E1129 Type 2 diabetes mellitus with other diabetic kidney complication: Secondary | ICD-10-CM | POA: Diagnosis not present

## 2016-03-05 DIAGNOSIS — D631 Anemia in chronic kidney disease: Secondary | ICD-10-CM | POA: Diagnosis not present

## 2016-03-05 DIAGNOSIS — N186 End stage renal disease: Secondary | ICD-10-CM | POA: Diagnosis not present

## 2016-03-05 DIAGNOSIS — K31819 Angiodysplasia of stomach and duodenum without bleeding: Secondary | ICD-10-CM

## 2016-03-05 DIAGNOSIS — E782 Mixed hyperlipidemia: Secondary | ICD-10-CM | POA: Diagnosis not present

## 2016-03-05 NOTE — Telephone Encounter (Signed)
Patient returned phone call. °

## 2016-03-05 NOTE — Telephone Encounter (Signed)
The patient called to advise me that she did get someone to bring her on Monday 12-11 to the EGD scheduled at [facility].  She knows to arrive at 12:30  PM for a 2:00 PM.

## 2016-03-05 NOTE — Telephone Encounter (Signed)
LM for the patient to advise her of Monday 12-11 procedure we scheduled at [facility].  I asked her to call me asap.

## 2016-03-05 NOTE — Telephone Encounter (Signed)
Per Dr. Silverio Decamp she is in agreement to do the EGD at [facility] on Monday 12-11 at 2:00 PM.  This has been schweduled.  I left this message for the patient on her answering machine today 03-05-2016 at 2:22 PM.  I asked her to call me back to let me know she got this message and I need to go over the instrucitons with her.

## 2016-03-05 NOTE — Telephone Encounter (Signed)
Dr. Silverio Decamp was in agreement to do the EGD at [facility] fon 03-08-2016 at 2:00 PM.  We originally scheduled it for 11:00 am however the patient has Dialysis early in the morning and could not get to [facility] for an 11:00 am.;

## 2016-03-08 ENCOUNTER — Encounter (HOSPITAL_COMMUNITY)
Admission: RE | Disposition: A | Discharge status: Home / Self Care | Payer: Self-pay | Source: Ambulatory Visit | Attending: Gastroenterology

## 2016-03-08 ENCOUNTER — Ambulatory Visit (HOSPITAL_COMMUNITY): Payer: Medicare Other | Admitting: Anesthesiology

## 2016-03-08 ENCOUNTER — Telehealth: Payer: Self-pay | Admitting: Gastroenterology

## 2016-03-08 ENCOUNTER — Ambulatory Visit (HOSPITAL_COMMUNITY)
Admission: RE | Admit: 2016-03-08 | Discharge: 2016-03-08 | Disposition: A | Discharge status: Home / Self Care | Payer: Medicare Other | Source: Ambulatory Visit | Attending: Gastroenterology | Admitting: Gastroenterology

## 2016-03-08 ENCOUNTER — Encounter (HOSPITAL_COMMUNITY): Payer: Self-pay

## 2016-03-08 DIAGNOSIS — K449 Diaphragmatic hernia without obstruction or gangrene: Secondary | ICD-10-CM | POA: Insufficient documentation

## 2016-03-08 DIAGNOSIS — Z79899 Other long term (current) drug therapy: Secondary | ICD-10-CM | POA: Diagnosis not present

## 2016-03-08 DIAGNOSIS — K219 Gastro-esophageal reflux disease without esophagitis: Secondary | ICD-10-CM | POA: Diagnosis not present

## 2016-03-08 DIAGNOSIS — I132 Hypertensive heart and chronic kidney disease with heart failure and with stage 5 chronic kidney disease, or end stage renal disease: Secondary | ICD-10-CM | POA: Diagnosis not present

## 2016-03-08 DIAGNOSIS — N186 End stage renal disease: Secondary | ICD-10-CM | POA: Diagnosis not present

## 2016-03-08 DIAGNOSIS — D509 Iron deficiency anemia, unspecified: Secondary | ICD-10-CM | POA: Insufficient documentation

## 2016-03-08 DIAGNOSIS — E1129 Type 2 diabetes mellitus with other diabetic kidney complication: Secondary | ICD-10-CM | POA: Diagnosis not present

## 2016-03-08 DIAGNOSIS — K31819 Angiodysplasia of stomach and duodenum without bleeding: Secondary | ICD-10-CM

## 2016-03-08 DIAGNOSIS — E785 Hyperlipidemia, unspecified: Secondary | ICD-10-CM | POA: Insufficient documentation

## 2016-03-08 DIAGNOSIS — M810 Age-related osteoporosis without current pathological fracture: Secondary | ICD-10-CM | POA: Insufficient documentation

## 2016-03-08 DIAGNOSIS — E782 Mixed hyperlipidemia: Secondary | ICD-10-CM | POA: Diagnosis not present

## 2016-03-08 DIAGNOSIS — N2581 Secondary hyperparathyroidism of renal origin: Secondary | ICD-10-CM | POA: Diagnosis not present

## 2016-03-08 DIAGNOSIS — R195 Other fecal abnormalities: Secondary | ICD-10-CM

## 2016-03-08 DIAGNOSIS — Z853 Personal history of malignant neoplasm of breast: Secondary | ICD-10-CM | POA: Insufficient documentation

## 2016-03-08 DIAGNOSIS — I2510 Atherosclerotic heart disease of native coronary artery without angina pectoris: Secondary | ICD-10-CM | POA: Diagnosis not present

## 2016-03-08 DIAGNOSIS — I509 Heart failure, unspecified: Secondary | ICD-10-CM | POA: Insufficient documentation

## 2016-03-08 DIAGNOSIS — Z7982 Long term (current) use of aspirin: Secondary | ICD-10-CM | POA: Diagnosis not present

## 2016-03-08 DIAGNOSIS — Z992 Dependence on renal dialysis: Secondary | ICD-10-CM | POA: Insufficient documentation

## 2016-03-08 DIAGNOSIS — D649 Anemia, unspecified: Secondary | ICD-10-CM

## 2016-03-08 DIAGNOSIS — Z87891 Personal history of nicotine dependence: Secondary | ICD-10-CM | POA: Diagnosis not present

## 2016-03-08 DIAGNOSIS — K31811 Angiodysplasia of stomach and duodenum with bleeding: Secondary | ICD-10-CM | POA: Diagnosis not present

## 2016-03-08 DIAGNOSIS — I252 Old myocardial infarction: Secondary | ICD-10-CM | POA: Diagnosis not present

## 2016-03-08 DIAGNOSIS — D631 Anemia in chronic kidney disease: Secondary | ICD-10-CM | POA: Diagnosis not present

## 2016-03-08 DIAGNOSIS — E0500 Thyrotoxicosis with diffuse goiter without thyrotoxic crisis or storm: Secondary | ICD-10-CM | POA: Diagnosis not present

## 2016-03-08 HISTORY — PX: ESOPHAGOGASTRODUODENOSCOPY (EGD) WITH PROPOFOL: SHX5813

## 2016-03-08 SURGERY — ESOPHAGOGASTRODUODENOSCOPY (EGD) WITH PROPOFOL
Anesthesia: Monitor Anesthesia Care

## 2016-03-08 MED ORDER — SUCRALFATE 1 G PO TABS
1.0000 g | ORAL_TABLET | Freq: Three times a day (TID) | ORAL | 0 refills | Status: DC
Start: 2016-03-08 — End: 2016-04-27

## 2016-03-08 MED ORDER — PROPOFOL 10 MG/ML IV BOLUS
INTRAVENOUS | Status: DC | PRN
  Administered 2016-03-08: 30 mg via INTRAVENOUS

## 2016-03-08 MED ORDER — PROPOFOL 500 MG/50ML IV EMUL
INTRAVENOUS | Status: DC | PRN
  Administered 2016-03-08: 75 ug/kg/min via INTRAVENOUS

## 2016-03-08 MED ORDER — PROPOFOL 10 MG/ML IV BOLUS
INTRAVENOUS | Status: AC
  Filled 2016-03-08: qty 60

## 2016-03-08 MED ORDER — LIDOCAINE 2% (20 MG/ML) 5 ML SYRINGE
INTRAMUSCULAR | Status: AC
  Filled 2016-03-08: qty 5

## 2016-03-08 MED ORDER — SODIUM CHLORIDE 0.9 % IV SOLN
INTRAVENOUS | Status: DC
  Administered 2016-03-08: 13:00:00 via INTRAVENOUS

## 2016-03-08 MED ORDER — LIDOCAINE 2% (20 MG/ML) 5 ML SYRINGE
INTRAMUSCULAR | Status: DC | PRN
  Administered 2016-03-08: 40 mg via INTRAVENOUS

## 2016-03-08 SURGICAL SUPPLY — 15 items

## 2016-03-08 NOTE — Telephone Encounter (Signed)
Called Endo, [facility] stated they  can not see office note. So I faxed over office note to Endo, Ellouise Newer seen pt on 12/7. Note has been signed

## 2016-03-08 NOTE — Discharge Instructions (Signed)
Esophagogastroduodenoscopy, Care After °Introduction °Refer to this sheet in the next few weeks. These instructions provide you with information about caring for yourself after your procedure. Your health care provider may also give you more specific instructions. Your treatment has been planned according to current medical practices, but problems sometimes occur. Call your health care provider if you have any problems or questions after your procedure. °What can I expect after the procedure? °After the procedure, it is common to have: °· A sore throat. °· Nausea. °· Bloating. °· Dizziness. °· Fatigue. °Follow these instructions at home: °· Do not eat or drink anything until the numbing medicine (local anesthetic) has worn off and your gag reflex has returned. You will know that the local anesthetic has worn off when you can swallow comfortably. °· Do not drive for 24 hours if you received a medicine to help you relax (sedative). °· If your health care provider took a tissue sample for testing during the procedure, make sure to get your test results. This is your responsibility. Ask your health care provider or the department performing the test when your results will be ready. °· Keep all follow-up visits as told by your health care provider. This is important. °Contact a health care provider if: °· You cannot stop coughing. °· You are not urinating. °· You are urinating less than usual. °Get help right away if: °· You have trouble swallowing. °· You cannot eat or drink. °· You have throat or chest pain that gets worse. °· You are dizzy or light-headed. °· You faint. °· You have nausea or vomiting. °· You have chills. °· You have a fever. °· You have severe abdominal pain. °· You have black, tarry, or bloody stools. °This information is not intended to replace advice given to you by your health care provider. Make sure you discuss any questions you have with your health care provider. °Document Released: 03/01/2012  Document Revised: 08/21/2015 Document Reviewed: 02/06/2015 °© 2017 [employer]vier ° °

## 2016-03-08 NOTE — Interval H&P Note (Signed)
Reviewed and agree with documentation and assessment and plan. Damaris Hippo , MD   History and Physical Interval Note:  03/08/2016 1:37 PM  [name]  has presented today for surgery, with the diagnosis of  GAVE disorder, Heme positive stool, Anemia  The various methods of treatment have been discussed with the patient and family. After consideration of risks, benefits and other options for treatment, the patient has consented to  Procedure(s): ESOPHAGOGASTRODUODENOSCOPY (EGD) WITH PROPOFOL (N/A) as a surgical intervention .  The patient's history has been reviewed, patient examined, no change in status, stable for surgery.  I have reviewed the patient's chart and labs.  Questions were answered to the patient's satisfaction.     Kavitha Nandigam

## 2016-03-08 NOTE — Anesthesia Preprocedure Evaluation (Signed)
Anesthesia Evaluation  Patient identified by MRN, date of birth, ID band Patient awake    Reviewed: Allergy & Precautions, NPO status , Patient's Chart, lab work & pertinent test results  Airway Mallampati: III  TM Distance: >3 FB Neck ROM: Full    Dental  (+) Edentulous Upper, Edentulous Lower   Pulmonary former smoker,    breath sounds clear to auscultation- rhonchi       Cardiovascular hypertension, Pt. on medications + CAD (Severe 3vessel CAD- high risk CABG candidate- medical management. Normal EF), + Past MI and +CHF   Rhythm:Regular Rate:Normal  05/2015: Normal LV size with mild LV hypertrophy. EF 55-60%. Normal RV   size and systolic function. Moderate mitral regurgitation. Mild   pulmonary hypertension.   Neuro/Psych Anxiety negative neurological ROS     GI/Hepatic Neg liver ROS, GERD  ,  Endo/Other  negative endocrine ROS  Renal/GU ESRF and DialysisRenal disease     Musculoskeletal  (+) Arthritis ,   Abdominal   Peds  Hematology  (+) anemia ,   Anesthesia Other Findings   Reproductive/Obstetrics                             Lab Results  Component Value Date   CREATININE 6.47 (HH) 10/30/2015   BUN 43 (H) 10/30/2015   NA 139 10/30/2015   K 4.0 10/30/2015   CL 97 10/30/2015   CO2 30 10/30/2015   Lab Results  Component Value Date   WBC 5.9 10/30/2015   HGB 10.8 (L) 10/30/2015   HCT 34.3 (L) 10/30/2015   MCV 88.7 10/30/2015   PLT 245.0 10/30/2015    Anesthesia Physical  Anesthesia Plan  ASA: IV  Anesthesia Plan: MAC   Post-op Pain Management:    Induction: Intravenous  Airway Management Planned: Natural Airway and Nasal Cannula  Additional Equipment:   Intra-op Plan:   Post-operative Plan:   Informed Consent: I have reviewed the patients History and Physical, chart, labs and discussed the procedure including the risks, benefits and alternatives for the  proposed anesthesia with the patient or authorized representative who has indicated his/her understanding and acceptance.     Plan Discussed with: CRNA  Anesthesia Plan Comments:         Anesthesia Quick Evaluation

## 2016-03-08 NOTE — Op Note (Signed)
[facility] Patient Name: [name] Procedure Date: 03/08/2016 MRN: [id number] Attending MD: Mauri Pole , MD Date of Birth: 02-15-1932 CSN: [id number] Age: 82 Admit Type: Outpatient Procedure:                Upper GI endoscopy Indications:              Iron deficiency anemia due to suspected upper                            gastrointestinal bleeding Providers:                Mauri Pole, MD, Jobe Igo, RN,                            William Dalton, Technician Referring MD:              Medicines:                Monitored Anesthesia Care Complications:            No immediate complications. Estimated Blood Loss:     Estimated blood loss was minimal. Procedure:                Pre-Anesthesia Assessment:                           - Prior to the procedure, a History and Physical                            was performed, and patient medications and                            allergies were reviewed. The patient's tolerance of                            previous anesthesia was also reviewed. The risks                            and benefits of the procedure and the sedation                            options and risks were discussed with the patient.                            All questions were answered, and informed consent                            was obtained. Prior Anticoagulants: The patient has                            taken no previous anticoagulant or antiplatelet                            agents. ASA Grade Assessment: III - A patient with  severe systemic disease. After reviewing the risks                            and benefits, the patient was deemed in                            satisfactory condition to undergo the procedure.                           After obtaining informed consent, the endoscope was                            passed under direct vision. Throughout the   procedure, the patient's blood pressure, pulse, and                            oxygen saturations were monitored continuously. The                            Endoscope was introduced through the mouth, and                            advanced to the second part of duodenum. The upper                            GI endoscopy was accomplished without difficulty.                            The patient tolerated the procedure well. Scope In: Scope Out: Findings:      The esophagus was normal.      A medium-sized hiatal hernia ~5cm was present.      Multiple less than 5 mm angioectasias were found in the gastric antrum       with active oozing of blood from few angioectasia near pylorus.       Coagulation for hemostasis and prevent further bleeding using argon       plasma was successful.      The examined duodenum was normal. Impression:               - Normal esophagus.                           - Medium-sized hiatal hernia.                           - Multiple bleeding angioectasias in the stomach.                            Treated with argon plasma coagulation (APC).                           - Normal examined duodenum.                           - No specimens collected. Moderate Sedation:      N/A- Per Anesthesia Care Recommendation:           -  Patient has a contact number available for                            emergencies. The signs and symptoms of potential                            delayed complications were discussed with the                            patient. Return to normal activities tomorrow.                            Written discharge instructions were provided to the                            patient.                           - Mechanical soft diet for 2 weeks.                           - Continue present medications. Continue PPI twice                            daily, 30 mins before breakfast and dinner                           - Repeat upper endoscopy in 6  months for                            retreatment.                           - Return to GI clinic in 3 months.                           - Use sucralfate tablets 1 gram PO TID before meals                            and at bedtime for 2 weeks. Procedure Code(s):        --- Professional ---                           [phone], Esophagogastroduodenoscopy, flexible,                            transoral; with control of bleeding, any method Diagnosis Code(s):        --- Professional ---                           K44.9, Diaphragmatic hernia without obstruction or                            gangrene  K31.811, Angiodysplasia of stomach and duodenum                            with bleeding                           D50.9, Iron deficiency anemia, unspecified CPT copyright 2016 American Medical Association. All rights reserved. The codes documented in this report are preliminary and upon coder review may  be revised to meet current compliance requirements. Mauri Pole, MD 03/08/2016 2:12:26 PM This report has been signed electronically. Number of Addenda: 0

## 2016-03-08 NOTE — Anesthesia Procedure Notes (Signed)
Procedure Name: MAC Date/Time: 03/08/2016 1:42 PM Performed by: Dione Booze Pre-anesthesia Checklist: Patient identified, Emergency Drugs available, Suction available and Patient being monitored Patient Re-evaluated:Patient Re-evaluated prior to inductionOxygen Delivery Method: Nasal cannula Placement Confirmation: positive ETCO2

## 2016-03-08 NOTE — H&P (View-Only) (Signed)
,   Chief Complaint: IDA, Hemoccult positive stools, GAVE  HPI:  Mrs. Constantine is an 82 year old Caucasian female with history of GAVE status post multiple prior EGDs with APC, the last in May 2017 which showed significant improvement in the gastric antral vascular ectasia, the patient only had a few residual lesions which were treated with APC. At that time her hemoglobin was still 9-10.   Most recently the patient tells me that her hemoglobin has dropped to around 9.2 from 11/10 in April of this year. She knows this because it is checked every Monday Wednesday and Friday at hemodialysis. The patient tells me that along with the drop in hemoglobin she has been experiencing some dizziness upon standing and shortness of breath. She tells me she had Hemoccult studies done and these were positive a week ago. She has had 2 iron infusions recently. The patient tells me that she was scheduled to have a screening EGD in January and would like to move this sooner due to current symptoms. Apparently these have been completed in the hospital before, but the patient is unsure why.   Patient's past medical history is significant for CAD with known three-vessel disease via cath in February, cardiac murmur, end-stage renal disease on dialysis every Monday Wednesday and Friday, facial droop, GERD, hyperlipidemia, hypertension and osteoporosis.   Patient denies fever, chills, seeing bright red blood or black tarry sticky stools, weight loss, anorexia, nausea, vomiting, heartburn, reflux, abdominal pain or syncope.  Past Medical History:  Diagnosis Date  . Anemia in chronic kidney disease    IV iron infusion and anemia from GAVE blood loss.  . Arthritis     hands  . Bradycardia 2014   a. Noted 06/2012.  . Breast cancer ([place])    Left Breast 13 yrs. ago  . CAD (coronary artery disease), native coronary artery    3 vessel CAD by cath 04/2015  . Cardiac murmur    a. thought due to AVF (2D echo 06/2012 without  significant valvular disease).   . Ectropion of right lower eyelid 11/2012   s/p surgery (Dr. [name])  . End stage renal disease on dialysis [facility])    HD M,W,F [place] AVF now LUE AVF  . Facial droop 1935   acquired during forceps delivery, some residual R visual loss  . Family history of adverse reaction to anesthesia    brother trouble waking up and confusion, now deceased  . GAVE (gastric antral vascular ectasia) 08/2014   s/p EGD with APC  . GERD (gastroesophageal reflux disease)   . Hearing loss    Bilateral hearing aids  . History of blood transfusion 5 weeks ago  . Hx of breast cancer 2004  . Hyperlipidemia   . Hypertension   . Hypotension    a. Associated w/ dialysis.  . Osteoporosis 11/2013   DEXA -3.4 radius  . Sight impaired    Left: wears contact. Right: diminished peripheral vision    Past Surgical History:  Procedure Laterality Date  . AV FISTULA PLACEMENT  07/20/10   Right brachiocephalic AVF  . BASCILIC VEIN TRANSPOSITION Left 07/24/2013   Procedure: BASILIC VEIN TRANSPOSITION;  Surgeon: [name], MD;  Location: [place];  Service: Vascular;  Laterality: Left;  . BREAST BIOPSY Left   . BREAST LUMPECTOMY Left   . CARDIAC CATHETERIZATION N/A 04/10/2015   Procedure: Left Heart Cath and Coronary Angiography;  Surgeon: [name], MD;  Location: [place] CV LAB;  Service:  Cardiovascular;  Laterality: N/A;  . CATARACT EXTRACTION Bilateral 1980  . DEXA  11/2015   DEXA T -4.3 [facility]  . ESOPHAGOGASTRODUODENOSCOPY N/A 04/09/2014   Procedure: ESOPHAGOGASTRODUODENOSCOPY (EGD);  Surgeon: [name], MD;  Location: [place];  Service: Endoscopy;  Laterality: N/A;  . ESOPHAGOGASTRODUODENOSCOPY N/A 09/23/2014   Procedure: ESOPHAGOGASTRODUODENOSCOPY (EGD);  Surgeon: [name], MD;  Location: [place];  Service: Endoscopy;  Laterality: N/A;  . ESOPHAGOGASTRODUODENOSCOPY N/A 12/26/2014   neg H pylori, benign biopsies;  ESOPHAGOGASTRODUODENOSCOPY (EGD);  Surgeon: [name], MD  . ESOPHAGOGASTRODUODENOSCOPY N/A 06/15/2015   Procedure: ESOPHAGOGASTRODUODENOSCOPY (EGD);  Surgeon: Milus Banister, MD;  Location: [place];  Service: Endoscopy;  Laterality: N/A;  . ESOPHAGOGASTRODUODENOSCOPY N/A 06/27/2015   Procedure: ESOPHAGOGASTRODUODENOSCOPY (EGD);  Surgeon: Mauri Pole, MD;  Location: [facility];  Service: Endoscopy;  Laterality: N/A;  . ESOPHAGOGASTRODUODENOSCOPY (EGD) WITH PROPOFOL N/A 11/12/2014   Procedure: ESOPHAGOGASTRODUODENOSCOPY (EGD) WITH PROPOFOL;  Surgeon: [name], MD;  Location: [facility];  Service: Endoscopy;  Laterality: N/A;  . ESOPHAGOGASTRODUODENOSCOPY (EGD) WITH PROPOFOL N/A 07/31/2015   Procedure: ESOPHAGOGASTRODUODENOSCOPY (EGD) WITH PROPOFOL;  Surgeon: Mauri Pole, MD;  Location: [facility];  Service: Endoscopy;  Laterality: N/A;  . EYE SURGERY  1966   regular cataract  . HERNIA REPAIR    . HOT HEMOSTASIS N/A 11/12/2014   Procedure: HOT HEMOSTASIS (ARGON PLASMA COAGULATION/BICAP);  Surgeon: [name], MD;  Location: [facility];  Service: Endoscopy;  Laterality: N/A;  . HOT HEMOSTASIS N/A 06/27/2015   Procedure: HOT HEMOSTASIS (ARGON PLASMA COAGULATION/BICAP);  Surgeon: Mauri Pole, MD;  Location: [facility];  Service: Endoscopy;  Laterality: N/A;  . HOT HEMOSTASIS N/A 07/31/2015   Procedure: HOT HEMOSTASIS (ARGON PLASMA COAGULATION/BICAP);  Surgeon: Mauri Pole, MD;  Location: [facility];  Service: Endoscopy;  Laterality: N/A;  . PERIPHERAL VASCULAR CATHETERIZATION N/A 08/15/2014   Procedure: A/V Shuntogram/Fistulagram;  Surgeon: [name], MD;  Location: [place] CV LAB;  Service: Cardiovascular;  Laterality: N/A;  . PERIPHERAL VASCULAR CATHETERIZATION N/A 04/28/2015   Procedure: A/V Shuntogram/Fistulagram;  Surgeon: [name], MD;  Location: [place] CV LAB;  Service: Cardiovascular;  Laterality: N/A;  . PERIPHERAL  VASCULAR CATHETERIZATION Left 04/28/2015   Procedure: A/V Shunt Intervention;  Surgeon: [name], MD;  Location: [place] CV LAB;  Service: Cardiovascular;  Laterality: Left;  . PERIPHERAL VASCULAR CATHETERIZATION Left 08/07/2015   Procedure: A/V Shuntogram/Fistulagram;  Surgeon: [name], MD;  Location: [place] CV LAB;  Service: Cardiovascular;  Laterality: Left;  . PERIPHERAL VASCULAR CATHETERIZATION N/A 08/07/2015   Procedure: A/V Shunt Intervention;  Surgeon: [name], MD;  Location: [place] CV LAB;  Service: Cardiovascular;  Laterality: N/A;  . PERIPHERAL VASCULAR CATHETERIZATION Left 11/03/2015   Procedure: A/V Shuntogram/Fistulagram;  Surgeon: [name], MD;  Location: [place] CV LAB;  Service: Cardiovascular;  Laterality: Left;  . PERIPHERAL VASCULAR CATHETERIZATION N/A 11/03/2015   Procedure: A/V Shunt Intervention;  Surgeon: [name], MD;  Location: [place] CV LAB;  Service: Cardiovascular;  Laterality: N/A;  . SHUNTOGRAM N/A 04/16/2011   Procedure: Earney Mallet;  Surgeon: [name], MD;  Location: [facility] C[facility] LAB;  Service: Cardiovascular;  Laterality: N/A;  . SHUNTOGRAM Right 07/05/2013   Procedure: FISTULOGRAM;  Surgeon: [name], MD;  Location: [facility] CATH LAB;  Service: Cardiovascular;  Laterality: Right;  . UMBILICAL HERNIA REPAIR  2011  . US ECHOCARDIOGRAPHY  06/2012   LVH, EF 65%, grade 1  diastol dysfunction, mod dliated LAD    Current Outpatient Prescriptions  Medication Sig Dispense Refill  . acetaminophen (TYLENOL) 500 MG tablet Take 1,000 mg by mouth every 6 (six) hours as needed for moderate pain.    Marland Kitchen amLODipine (NORVASC) 2.5 MG tablet Take 2.5 mg by mouth at bedtime.    . Artificial Tear Ointment (REFRESH P.M. OP) Place 1 application into the right eye daily.    Marland Kitchen aspirin EC 81 MG tablet Take 1 tablet (81 mg total) by mouth daily. 90 tablet 3  . atorvastatin (LIPITOR) 80 MG tablet Take 80 mg by mouth at bedtime.    . B  Complex-C-Folic Acid (RENA-VITE PO) Take 1 tablet by mouth daily with breakfast.     . Calcium Carbonate Antacid (TUMS ULTRA 1000 PO) Take 3,000 mg by mouth 3 (three) times daily after meals.     . cholecalciferol (VITAMIN D) 1000 UNITS tablet Take 1,000 Units by mouth every Monday, Wednesday, and Friday.     . fexofenadine (ALLEGRA) 180 MG tablet Take 180 mg by mouth daily.     . metoprolol tartrate (LOPRESSOR) 25 MG tablet Take 0.5 tablets (12.5 mg total) by mouth 2 (two) times daily. 60 tablet 11  . pantoprazole (PROTONIX) 40 MG tablet TAKE 1 TABLET BY MOUTH TWICE A DAY (Patient taking differently: TAKE 1 TABLET BY MOUTH ONCE IN THE MORNING) 60 tablet 3  . traMADol (ULTRAM) 50 MG tablet Take 0.5 tablets (25 mg total) by mouth 2 (two) times daily as needed for moderate pain. 20 tablet 0   No current facility-administered medications for this visit.     Allergies as of 03/04/2016 - Review Complete 03/04/2016  Allergen Reaction Noted  . Sulfa antibiotics Rash 12/14/2010    Family History  Problem Relation Age of Onset  . Cancer Father     stomach  . CAD Father     MI at age 66  . Heart disease Father   . Heart attack Father   . Hypertension Mother   . Heart disease Brother     Congenital  . Hypertension Brother   . Diabetes Brother   . Cancer Sister     female (uterus?)    Social History   Social History  . Marital status: Single    Spouse name: N/A  . Number of children: N/A  . Years of education: N/A   Occupational History  . 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      retired   Social History Main Topics  . Smoking status: Former Smoker    Years: 2.00    Types: Cigarettes    Quit date: 03/29/1953  . Smokeless tobacco: Never Used  . Alcohol use 0.0 oz/week     Comment: occasional  . Drug use: No  . Sexual activity: No   Other Topics Concern  . Not on file   Social History Narrative   As of 03/2013. Lives alone with her small dog.   Bother and sister in law died last year.     Occupation: retired, was Information systems manager for [employer]   Edu: some college    Review of Systems:     Constitutional: Positive for fatigue No weight loss Cardiovascular: No chest pain Respiratory: Positive for daily No SOB or cough Gastrointestinal: See HPI and otherwise negative   Physical Exam:  Vital signs: BP (!) 110/50 (BP Location: Right Arm, Patient Position: Sitting, Cuff Size: Normal)   Pulse 72  Ht 4\' 9"  (1.448 m)   Wt 116 lb 4 oz (52.7 kg)   BMI 25.16 kg/m   Constitutional:   Pleasant Caucasian female appears to be in NAD, Well developed, Well nourished, alert and cooperative Head:  Facial asymmetry with missing right eye Eyes:   No icterus. Ears:  Normal auditory acuity. Neck:  Supple Throat: Oral cavity and pharynx without inflammation, swelling or lesion.  Respiratory: Respirations even and unlabored. Lungs clear to auscultation bilaterally.   No wheezes, crackles, or rhonchi.  Cardiovascular: Normal S1, S2. No MRG. Regular rate and rhythm. No peripheral edema, cyanosis or pallor.  Gastrointestinal:  Soft, nondistended, nontender. No rebound or guarding. Normal bowel sounds. No appreciable masses or hepatomegaly. Rectal:  Not performed.   RELEVANT LABS: We do not have any recent labs, patient tells me that her last hemoglobin was 9.2 on Friday, peroneal me this had dropped down to 8 something and is increased up to 9.2  Assessment: 1. IDA: Chronic for the patient with her end-stage renal disease and history of GAVE which had been stable over the past 6 months, patient now with recent drop in hemoglobin less than 9 and Hemoccult-positive stools, concern for bleeding 2. History of GAVE: Last EGD in May  Plan: 1. Recommend patient proceed with an EGD. This will be scheduled with Dr. Silverio Decamp. Will discuss timing with Dr. Silverio Decamp. Risks, benefits, limitations and alternatives of the procedure were discussed and  the patient agrees to proceed. These were previously performed in the hospital, unsure exactly why, will discuss with Dr. Silverio Decamp. 2. Patient to continue her Protonix 40 mg twice a day 3. Patient to proceed to the ER if she feels extremely dizzy, syncopal, or sees any bright red blood or black tarry sticky stools. She verbalizes understanding  Ellouise Newer, PA-C [facility] 03/04/2016, 2:03 PM  Cc: [name], MD

## 2016-03-08 NOTE — Transfer of Care (Signed)
Immediate Anesthesia Transfer of Care Note  Patient: [name]  Procedure(s) Performed: Procedure(s): ESOPHAGOGASTRODUODENOSCOPY (EGD) WITH PROPOFOL (N/A)  Patient Location: PACU and Endoscopy Unit  Anesthesia Type:MAC  Level of Consciousness: awake and patient cooperative  Airway & Oxygen Therapy: Patient Spontanous Breathing and Patient connected to nasal cannula oxygen  Post-op Assessment: Report given to RN and Post -op Vital signs reviewed and stable  Post vital signs: Reviewed and stable  Last Vitals:  Vitals:   03/08/16 1315  BP: (!) 140/100  Pulse: 64  Resp: 15  Temp: 36.6 C    Last Pain:  Vitals:   03/08/16 1315  TempSrc: Oral         Complications: No apparent anesthesia complications

## 2016-03-08 NOTE — Anesthesia Postprocedure Evaluation (Signed)
Anesthesia Post Note  Patient: [name]  Procedure(s) Performed: Procedure(s) (LRB): ESOPHAGOGASTRODUODENOSCOPY (EGD) WITH PROPOFOL (N/A)  Patient location during evaluation: PACU Anesthesia Type: MAC Level of consciousness: awake and alert Pain management: pain level controlled Vital Signs Assessment: post-procedure vital signs reviewed and stable Respiratory status: spontaneous breathing, nonlabored ventilation, respiratory function stable and patient connected to nasal cannula oxygen Cardiovascular status: stable and blood pressure returned to baseline Anesthetic complications: no    Last Vitals:  Vitals:   03/08/16 1430 03/08/16 1435  BP: (!) 159/52   Pulse: 72 69  Resp: 20 (!) 22  Temp:      Last Pain:  Vitals:   03/08/16 1408  TempSrc: Oral                 Tiajuana Amass

## 2016-03-09 ENCOUNTER — Encounter (HOSPITAL_COMMUNITY): Payer: Self-pay | Admitting: Gastroenterology

## 2016-03-10 DIAGNOSIS — N186 End stage renal disease: Secondary | ICD-10-CM | POA: Diagnosis not present

## 2016-03-10 DIAGNOSIS — N2581 Secondary hyperparathyroidism of renal origin: Secondary | ICD-10-CM | POA: Diagnosis not present

## 2016-03-10 DIAGNOSIS — E782 Mixed hyperlipidemia: Secondary | ICD-10-CM | POA: Diagnosis not present

## 2016-03-10 DIAGNOSIS — E1129 Type 2 diabetes mellitus with other diabetic kidney complication: Secondary | ICD-10-CM | POA: Diagnosis not present

## 2016-03-10 DIAGNOSIS — D631 Anemia in chronic kidney disease: Secondary | ICD-10-CM | POA: Diagnosis not present

## 2016-03-10 NOTE — Progress Notes (Signed)
Reviewed and agree with documentation and assessment and plan. K. Veena Kadien Lineman , MD   

## 2016-03-12 DIAGNOSIS — E782 Mixed hyperlipidemia: Secondary | ICD-10-CM | POA: Diagnosis not present

## 2016-03-12 DIAGNOSIS — E1129 Type 2 diabetes mellitus with other diabetic kidney complication: Secondary | ICD-10-CM | POA: Diagnosis not present

## 2016-03-12 DIAGNOSIS — N2581 Secondary hyperparathyroidism of renal origin: Secondary | ICD-10-CM | POA: Diagnosis not present

## 2016-03-12 DIAGNOSIS — D631 Anemia in chronic kidney disease: Secondary | ICD-10-CM | POA: Diagnosis not present

## 2016-03-12 DIAGNOSIS — N186 End stage renal disease: Secondary | ICD-10-CM | POA: Diagnosis not present

## 2016-03-15 ENCOUNTER — Other Ambulatory Visit: Payer: Self-pay | Admitting: Gastroenterology

## 2016-03-15 DIAGNOSIS — N186 End stage renal disease: Secondary | ICD-10-CM | POA: Diagnosis not present

## 2016-03-15 DIAGNOSIS — E782 Mixed hyperlipidemia: Secondary | ICD-10-CM | POA: Diagnosis not present

## 2016-03-15 DIAGNOSIS — E1129 Type 2 diabetes mellitus with other diabetic kidney complication: Secondary | ICD-10-CM | POA: Diagnosis not present

## 2016-03-15 DIAGNOSIS — N2581 Secondary hyperparathyroidism of renal origin: Secondary | ICD-10-CM | POA: Diagnosis not present

## 2016-03-15 DIAGNOSIS — D631 Anemia in chronic kidney disease: Secondary | ICD-10-CM | POA: Diagnosis not present

## 2016-03-17 DIAGNOSIS — N2581 Secondary hyperparathyroidism of renal origin: Secondary | ICD-10-CM | POA: Diagnosis not present

## 2016-03-17 DIAGNOSIS — N186 End stage renal disease: Secondary | ICD-10-CM | POA: Diagnosis not present

## 2016-03-17 DIAGNOSIS — D631 Anemia in chronic kidney disease: Secondary | ICD-10-CM | POA: Diagnosis not present

## 2016-03-17 DIAGNOSIS — E1129 Type 2 diabetes mellitus with other diabetic kidney complication: Secondary | ICD-10-CM | POA: Diagnosis not present

## 2016-03-17 DIAGNOSIS — E782 Mixed hyperlipidemia: Secondary | ICD-10-CM | POA: Diagnosis not present

## 2016-03-19 ENCOUNTER — Emergency Department
Admission: EM | Admit: 2016-03-19 | Discharge: 2016-03-19 | Disposition: A | Discharge status: Home / Self Care | Payer: Medicare Other | Attending: Emergency Medicine | Admitting: Emergency Medicine

## 2016-03-19 ENCOUNTER — Encounter
Admission: EM | Disposition: A | Discharge status: Home / Self Care | Payer: Self-pay | Source: Home / Self Care | Attending: Emergency Medicine

## 2016-03-19 DIAGNOSIS — T82590A Other mechanical complication of surgically created arteriovenous fistula, initial encounter: Secondary | ICD-10-CM

## 2016-03-19 DIAGNOSIS — Z992 Dependence on renal dialysis: Secondary | ICD-10-CM | POA: Diagnosis not present

## 2016-03-19 DIAGNOSIS — T829XXA Unspecified complication of cardiac and vascular prosthetic device, implant and graft, initial encounter: Secondary | ICD-10-CM | POA: Diagnosis not present

## 2016-03-19 DIAGNOSIS — M810 Age-related osteoporosis without current pathological fracture: Secondary | ICD-10-CM | POA: Diagnosis not present

## 2016-03-19 DIAGNOSIS — E1122 Type 2 diabetes mellitus with diabetic chronic kidney disease: Secondary | ICD-10-CM | POA: Insufficient documentation

## 2016-03-19 DIAGNOSIS — N2581 Secondary hyperparathyroidism of renal origin: Secondary | ICD-10-CM | POA: Diagnosis not present

## 2016-03-19 DIAGNOSIS — T82858A Stenosis of vascular prosthetic devices, implants and grafts, initial encounter: Secondary | ICD-10-CM | POA: Insufficient documentation

## 2016-03-19 DIAGNOSIS — E1129 Type 2 diabetes mellitus with other diabetic kidney complication: Secondary | ICD-10-CM | POA: Diagnosis not present

## 2016-03-19 DIAGNOSIS — E782 Mixed hyperlipidemia: Secondary | ICD-10-CM | POA: Diagnosis not present

## 2016-03-19 DIAGNOSIS — I2510 Atherosclerotic heart disease of native coronary artery without angina pectoris: Secondary | ICD-10-CM | POA: Diagnosis not present

## 2016-03-19 DIAGNOSIS — Z87891 Personal history of nicotine dependence: Secondary | ICD-10-CM | POA: Insufficient documentation

## 2016-03-19 DIAGNOSIS — I132 Hypertensive heart and chronic kidney disease with heart failure and with stage 5 chronic kidney disease, or end stage renal disease: Secondary | ICD-10-CM | POA: Diagnosis not present

## 2016-03-19 DIAGNOSIS — N186 End stage renal disease: Secondary | ICD-10-CM | POA: Diagnosis not present

## 2016-03-19 DIAGNOSIS — Z853 Personal history of malignant neoplasm of breast: Secondary | ICD-10-CM | POA: Diagnosis not present

## 2016-03-19 DIAGNOSIS — E785 Hyperlipidemia, unspecified: Secondary | ICD-10-CM | POA: Diagnosis not present

## 2016-03-19 DIAGNOSIS — K219 Gastro-esophageal reflux disease without esophagitis: Secondary | ICD-10-CM | POA: Insufficient documentation

## 2016-03-19 DIAGNOSIS — R58 Hemorrhage, not elsewhere classified: Secondary | ICD-10-CM | POA: Diagnosis not present

## 2016-03-19 DIAGNOSIS — T82838A Hemorrhage of vascular prosthetic devices, implants and grafts, initial encounter: Secondary | ICD-10-CM | POA: Diagnosis not present

## 2016-03-19 DIAGNOSIS — I10 Essential (primary) hypertension: Secondary | ICD-10-CM | POA: Diagnosis not present

## 2016-03-19 DIAGNOSIS — D631 Anemia in chronic kidney disease: Secondary | ICD-10-CM | POA: Diagnosis not present

## 2016-03-19 DIAGNOSIS — Y832 Surgical operation with anastomosis, bypass or graft as the cause of abnormal reaction of the patient, or of later complication, without mention of misadventure at the time of the procedure: Secondary | ICD-10-CM | POA: Diagnosis not present

## 2016-03-19 HISTORY — PX: PERIPHERAL VASCULAR CATHETERIZATION: SHX172C

## 2016-03-19 LAB — POTASSIUM (ARMC VASCULAR LAB ONLY): Potassium (ARMC vascular lab): 3.4 — ABNORMAL LOW (ref 3.5–5.1)

## 2016-03-19 SURGERY — A/V FISTULAGRAM
Anesthesia: Moderate Sedation

## 2016-03-19 MED ORDER — IOPAMIDOL (ISOVUE-300) INJECTION 61%
INTRAVENOUS | Status: DC | PRN
  Administered 2016-03-19: 25 mL via INTRAVENOUS

## 2016-03-19 MED ORDER — HEPARIN (PORCINE) IN NACL 2-0.9 UNIT/ML-% IJ SOLN
INTRAMUSCULAR | Status: AC
  Filled 2016-03-19: qty 1000

## 2016-03-19 MED ORDER — MIDAZOLAM HCL 2 MG/2ML IJ SOLN
INTRAMUSCULAR | Status: AC
  Filled 2016-03-19: qty 2

## 2016-03-19 MED ORDER — HEPARIN SODIUM (PORCINE) 1000 UNIT/ML IJ SOLN
INTRAMUSCULAR | Status: AC
  Filled 2016-03-19: qty 1

## 2016-03-19 MED ORDER — MIDAZOLAM HCL 2 MG/2ML IJ SOLN
INTRAMUSCULAR | Status: DC | PRN
  Administered 2016-03-19: 1 mg via INTRAVENOUS
  Administered 2016-03-19: 2 mg via INTRAVENOUS

## 2016-03-19 MED ORDER — FENTANYL CITRATE (PF) 100 MCG/2ML IJ SOLN
INTRAMUSCULAR | Status: DC | PRN
  Administered 2016-03-19: 50 ug via INTRAVENOUS
  Administered 2016-03-19: 25 ug via INTRAVENOUS

## 2016-03-19 MED ORDER — LIDOCAINE HCL (PF) 1 % IJ SOLN
INTRAMUSCULAR | Status: AC
  Filled 2016-03-19: qty 30

## 2016-03-19 MED ORDER — FENTANYL CITRATE (PF) 100 MCG/2ML IJ SOLN
INTRAMUSCULAR | Status: AC
  Filled 2016-03-19: qty 2

## 2016-03-19 MED ORDER — CLINDAMYCIN PHOSPHATE 300 MG/50ML IV SOLN: 300.0000 mg | INTRAVENOUS | Status: DC

## 2016-03-19 SURGICAL SUPPLY — 14 items
BALLN DORADO 7X40X80 (BALLOONS) ×3
BALLN ULTRVRSE 8X40X75C (BALLOONS) ×3
BALLOON DORADO 7X40X80 (BALLOONS) IMPLANT
BALLOON ULTRVRSE 8X40X75C (BALLOONS) IMPLANT
DEVICE PRESTO INFLATION (MISCELLANEOUS) ×2 IMPLANT
DRAPE BRACHIAL (DRAPES) ×3 IMPLANT
PACK ANGIOGRAPHY (CUSTOM PROCEDURE TRAY) ×3 IMPLANT
SET INTRO CAPELLA COAXIAL (SET/KITS/TRAYS/PACK) ×3 IMPLANT
SHEATH BRITE TIP 6FRX5.5 (SHEATH) ×3 IMPLANT
SHEATH BRITE TIP 7FRX5.5 (SHEATH) ×2 IMPLANT
STENT VIABAHN 8X2.5X120 (Permanent Stent) IMPLANT
STENT VIABAHN 8X25X120 (Permanent Stent) ×3 IMPLANT
TOWEL OR 17X26 4PK STRL BLUE (TOWEL DISPOSABLE) ×3 IMPLANT
WIRE G 018X200 V18 (WIRE) ×2 IMPLANT

## 2016-03-19 NOTE — H&P (Signed)
[facility] Admission History & Physical  MRN : [id number]  [name] is a 82 y.o. (02-09-1932) female who presents with chief complaint of  Chief Complaint  Patient presents with  . bleeding fistula  .  History of Present Illness: I am asked to evaluate the patient by Dr Joni Fears for a bleeding fistula. The patient was sent here because they were unable to stop her bleeding after decannulating her this morning. Furthermore the Center states there is A very weak thrill and bruit. The patient states this is the first dialysis run to have problems. The patient is unaware of any other change. This is the first time she has had significant bleeding postdialysis  Patient denies pain or tenderness overlying the access.  There is no pain with dialysis.  The patient denies hand pain or finger pain consistent with steal syndrome.   There have been several past interventions of this access. She notes she has 2 stents in her shoulder.  The patient is not chronically hypotensive on dialysis.  No current facility-administered medications for this encounter.     Past Medical History:  Diagnosis Date  . Anemia in chronic kidney disease    IV iron infusion and anemia from GAVE blood loss.  . Arthritis     hands  . Bradycardia 2014   a. Noted 06/2012.  . Breast cancer ([place])    Left Breast 13 yrs. ago  . CAD (coronary artery disease), native coronary artery    3 vessel CAD by cath 04/2015  . Cardiac murmur    a. thought due to AVF (2D echo 06/2012 without significant valvular disease).   . Ectropion of right lower eyelid 11/2012   s/p surgery (Dr. [name])  . End stage renal disease on dialysis [facility])    HD M,W,F [place] AVF now LUE AVF  . Facial droop 1935   acquired during forceps delivery, some residual R visual loss  . Family history of adverse reaction to anesthesia    brother trouble waking up and confusion, now deceased  . GAVE (gastric  antral vascular ectasia) 08/2014   s/p EGD with APC  . GERD (gastroesophageal reflux disease)   . Hearing loss    Bilateral hearing aids  . History of blood transfusion 5 weeks ago  . Hx of breast cancer 2004  . Hyperlipidemia   . Hypertension   . Hypotension    a. Associated w/ dialysis.  . Osteoporosis 11/2013   DEXA -3.4 radius  . Sight impaired    Left: wears contact. Right: diminished peripheral vision    Past Surgical History:  Procedure Laterality Date  . AV FISTULA PLACEMENT  07/20/10   Right brachiocephalic AVF  . BASCILIC VEIN TRANSPOSITION Left 07/24/2013   Procedure: BASILIC VEIN TRANSPOSITION;  Surgeon: [name], MD;  Location: [place];  Service: Vascular;  Laterality: Left;  . BREAST BIOPSY Left   . BREAST LUMPECTOMY Left   . CARDIAC CATHETERIZATION N/A 04/10/2015   Procedure: Left Heart Cath and Coronary Angiography;  Surgeon: [name], MD;  Location: [place] [facility];  Service: Cardiovascular;  Laterality: N/A;  . CATARACT EXTRACTION Bilateral 1980  . DEXA  11/2015   DEXA T -4.3 [facility]  . ESOPHAGOGASTRODUODENOSCOPY N/A 04/09/2014   Procedure: ESOPHAGOGASTRODUODENOSCOPY (EGD);  Surgeon: [name], MD;  Location: [place];  Service: Endoscopy;  Laterality: N/A;  . ESOPHAGOGASTRODUODENOSCOPY N/A 09/23/2014   Procedure: ESOPHAGOGASTRODUODENOSCOPY (EGD);  Surgeon: [name],  MD;  Location: [facility];  Service: Endoscopy;  Laterality: N/A;  . ESOPHAGOGASTRODUODENOSCOPY N/A 12/26/2014   neg H pylori, benign biopsies; ESOPHAGOGASTRODUODENOSCOPY (EGD);  Surgeon: [name], MD  . ESOPHAGOGASTRODUODENOSCOPY N/A 06/15/2015   Procedure: ESOPHAGOGASTRODUODENOSCOPY (EGD);  Surgeon: Milus Banister, MD;  Location: [place];  Service: Endoscopy;  Laterality: N/A;  . ESOPHAGOGASTRODUODENOSCOPY N/A 06/27/2015   Procedure: ESOPHAGOGASTRODUODENOSCOPY (EGD);  Surgeon: Mauri Pole, MD;  Location: [facility];  Service: Endoscopy;  Laterality:  N/A;  . ESOPHAGOGASTRODUODENOSCOPY (EGD) WITH PROPOFOL N/A 11/12/2014   Procedure: ESOPHAGOGASTRODUODENOSCOPY (EGD) WITH PROPOFOL;  Surgeon: [name], MD;  Location: [facility];  Service: Endoscopy;  Laterality: N/A;  . ESOPHAGOGASTRODUODENOSCOPY (EGD) WITH PROPOFOL N/A 07/31/2015   Procedure: ESOPHAGOGASTRODUODENOSCOPY (EGD) WITH PROPOFOL;  Surgeon: Mauri Pole, MD;  Location: [facility];  Service: Endoscopy;  Laterality: N/A;  . ESOPHAGOGASTRODUODENOSCOPY (EGD) WITH PROPOFOL N/A 03/08/2016   Procedure: ESOPHAGOGASTRODUODENOSCOPY (EGD) WITH PROPOFOL;  Surgeon: Mauri Pole, MD;  Location: [facility];  Service: Endoscopy;  Laterality: N/A;  . EYE SURGERY  1966   regular cataract  . HERNIA REPAIR    . HOT HEMOSTASIS N/A 11/12/2014   Procedure: HOT HEMOSTASIS (ARGON PLASMA COAGULATION/BICAP);  Surgeon: [name], MD;  Location: [facility];  Service: Endoscopy;  Laterality: N/A;  . HOT HEMOSTASIS N/A 06/27/2015   Procedure: HOT HEMOSTASIS (ARGON PLASMA COAGULATION/BICAP);  Surgeon: Mauri Pole, MD;  Location: [facility];  Service: Endoscopy;  Laterality: N/A;  . HOT HEMOSTASIS N/A 07/31/2015   Procedure: HOT HEMOSTASIS (ARGON PLASMA COAGULATION/BICAP);  Surgeon: Mauri Pole, MD;  Location: [facility];  Service: Endoscopy;  Laterality: N/A;  . PERIPHERAL VASCULAR CATHETERIZATION N/A 08/15/2014   Procedure: A/V Shuntogram/Fistulagram;  Surgeon: [name], MD;  Location: [place] [facility];  Service: Cardiovascular;  Laterality: N/A;  . PERIPHERAL VASCULAR CATHETERIZATION N/A 04/28/2015   Procedure: A/V Shuntogram/Fistulagram;  Surgeon: [name], MD;  Location: [place] [facility];  Service: Cardiovascular;  Laterality: N/A;  . PERIPHERAL VASCULAR CATHETERIZATION Left 04/28/2015   Procedure: A/V Shunt Intervention;  Surgeon: [name], MD;  Location: [place] [facility];  Service: Cardiovascular;  Laterality: Left;  . PERIPHERAL VASCULAR  CATHETERIZATION Left 08/07/2015   Procedure: A/V Shuntogram/Fistulagram;  Surgeon: [name], MD;  Location: [place] [facility];  Service: Cardiovascular;  Laterality: Left;  . PERIPHERAL VASCULAR CATHETERIZATION N/A 08/07/2015   Procedure: A/V Shunt Intervention;  Surgeon: [name], MD;  Location: [place] [facility];  Service: Cardiovascular;  Laterality: N/A;  . PERIPHERAL VASCULAR CATHETERIZATION Left 11/03/2015   Procedure: A/V Shuntogram/Fistulagram;  Surgeon: [name], MD;  Location: [place] [facility];  Service: Cardiovascular;  Laterality: Left;  . PERIPHERAL VASCULAR CATHETERIZATION N/A 11/03/2015   Procedure: A/V Shunt Intervention;  Surgeon: [name], MD;  Location: [place] [facility];  Service: Cardiovascular;  Laterality: N/A;  . SHUNTOGRAM N/A 04/16/2011   Procedure: [facility]ney Mallet;  Surgeon: [name], MD;  Location: [facility];  Service: Cardiovascular;  Laterality: N/A;  . SHUNTOGRAM Right 07/05/2013   Procedure: FISTULOGRAM;  Surgeon: [name], MD;  Location: [facility] CATH LAB;  Service: Cardiovascular;  Laterality: Right;  . UMBILICAL HERNIA REPAIR  2011  . US ECHOCARDIOGRAPHY  06/2012   LVH, EF 65%, grade 1 diastol dysfunction, mod dliated LAD    Social History Social History  Substance Use Topics  . Smoking status: Former Smoker    Years: 2.00    Types:  Cigarettes    Quit date: 03/29/1953  . Smokeless tobacco: Never Used  . Alcohol use 0.0 oz/week     Comment: occasional    Family History Family History  Problem Relation Age of Onset  . Cancer Father     stomach  . CAD Father     MI at age 48  . Heart disease Father   . Heart attack Father   . Hypertension Mother   . Heart disease Brother     Congenital  . Hypertension Brother   . Diabetes Brother   . Cancer Sister     female (uterus?)    No family history of bleeding clotting disorders, autoimmune disease or porphyria  Allergies  Allergen Reactions  . Sulfa Antibiotics Rash      REVIEW OF SYSTEMS (Negative unless checked)  Constitutional: [] Weight loss  [] Fever  [] Chills Cardiac: [] Chest pain   [] Chest pressure   [] Palpitations   [] Shortness of breath when laying flat   [] Shortness of breath at rest   [x] Shortness of breath with exertion. Vascular:  [] Pain in legs with walking   [] Pain in legs at rest   [] Pain in legs when laying flat   [] Claudication   [] Pain in feet when walking  [] Pain in feet at rest  [] Pain in feet when laying flat   [] History of DVT   [] Phlebitis   [] Swelling in legs   [] Varicose veins   [] Non-healing ulcers Pulmonary:   [] Uses home oxygen   [] Productive cough   [] Hemoptysis   [] Wheeze  [] COPD   [] Asthma Neurologic:  [] Dizziness  [] Blackouts   [] Seizures   [] History of stroke   [] History of TIA  [] Aphasia   [] Temporary blindness   [] Dysphagia   [] Weakness or numbness in arms   [] Weakness or numbness in legs Musculoskeletal:  [] Arthritis   [] Joint swelling   [] Joint pain   [] Low back pain Hematologic:  [] Easy bruising  [] Easy bleeding   [] Hypercoagulable state   [] Anemic  [] Hepatitis Gastrointestinal:  [] Blood in stool   [] Vomiting blood  [] Gastroesophageal reflux/heartburn   [] Difficulty swallowing. Genitourinary:  [x] Chronic kidney disease   [] Difficult urination  [] Frequent urination  [] Burning with urination   [] Blood in urine Skin:  [] Rashes   [] Ulcers   [] Wounds Psychological:  [] History of anxiety   []  History of major depression.  Physical Examination  Vitals:   03/19/16 1005 03/19/16 1006 03/19/16 1202 03/19/16 1214  BP: (!) 159/75  (!) 150/58 (!) 175/78  Pulse: 77  67 74  Resp: 16  16 16   Temp: 97.7 F (36.5 C)   97.7 F (36.5 C)  TempSrc: Oral   Oral  SpO2: 98%  95% 98%  Weight:  51.3 kg (113 lb)  51.3 kg (113 lb)  Height:  4\' 9"  (1.448 m)  4\' 9"  (1.448 m)   Body mass index is 24.45 kg/m. Gen: WD/WN, NAD Head: [place]/AT, No temporalis wasting. Prominent temp pulse not noted. Ear/Nose/Throat: Hearing grossly intact, nares  w/o erythema or drainage, oropharynx w/o Erythema/Exudate,  Eyes: Conjunctiva clear, sclera non-icteric Neck: Trachea midline.  No JVD.  Pulmonary:  Good air movement, respirations not labored, no use of accessory muscles.  Cardiac: RRR, normal S1, S2. Vascular: Left arm brachiocephalic fistula markedly pulsatile with a very staccato thrill stitch present in the area used for arterial cannulation Vessel Right Left  Radial Palpable Palpable  Ulnar Palpable Palpable  Brachial Palpable Palpable  Carotid Palpable, without bruit Palpable, without bruit  Gastrointestinal: soft, non-tender/non-distended. No guarding/reflex.  Musculoskeletal: M/S 5/5 throughout.  Extremities without ischemic changes.  No deformity or atrophy.  Neurologic: Sensation grossly intact in extremities.  Symmetrical.  Speech is fluent. Motor exam as listed above. Psychiatric: Judgment intact, Mood & affect appropriate for pt's clinical situation. Dermatologic: No rashes or ulcers noted.  No cellulitis or open wounds. Lymph : No Cervical, Axillary, or Inguinal lymphadenopathy.   CBC Lab Results  Component Value Date   WBC 5.9 10/30/2015   HGB 10.8 (L) 10/30/2015   HCT 34.3 (L) 10/30/2015   MCV 88.7 10/30/2015   PLT 245.0 10/30/2015    BMET    Component Value Date/Time   NA 139 10/30/2015 0916   NA 136 10/23/2013 1504   K 4.0 10/30/2015 0916   K 3.7 11/07/2013 1055   K 5.1 07/12/2012   CL 97 10/30/2015 0916   CL 98 10/23/2013 1504   CO2 30 10/30/2015 0916   CO2 30 10/23/2013 1504   GLUCOSE 278 (H) 10/30/2015 0916   GLUCOSE 231 (H) 10/23/2013 1504   BUN 43 (H) 10/30/2015 0916   BUN 49 (H) 10/23/2013 1504   CREATININE 6.47 (HH) 10/30/2015 0916   CREATININE 5.95 (H) 10/23/2013 1504   CREATININE 6.9 02/14/2013   CALCIUM 9.4 10/30/2015 0916   CALCIUM 9.0 10/23/2013 1504   GFRNONAA 6 (L) 06/19/2015 0949   GFRNONAA 6 (L) 10/23/2013 1504   GFRAA 7 (L) 06/19/2015 0949   GFRAA 7 (L) 10/23/2013 1504   CrCl  cannot be calculated (Patient's most recent lab result is older than the maximum 21 days allowed.).  COAG Lab Results  Component Value Date   INR 1.04 06/19/2015   INR 1.00 04/09/2015   INR 1.02 09/22/2014    Radiology Dg Lumbar Spine Complete  Result Date: 02/26/2016 CLINICAL DATA:  Low back pain EXAM: LUMBAR SPINE - COMPLETE 4+ VIEW COMPARISON:  None. FINDINGS: There is a thoracic kyphosis and slight lumbar lordosis present. The bones are diffusely osteopenic. There is degenerative disc disease at L5-S1, T12-L1 and L1-2 levels. There is partial compression deformity of [id number] of uncertain age with no definite retropulsion. The bowel gas pattern is nonspecific. Splenic artery calcification is present. IMPRESSION: 1. Probable old compression deformity of T11.  No retropulsion. 2. Degenerative disc disease at T12-L1, L1-2, and L5-S1 levels. 3. Thoracic kyphosis and lumbar lordosis with diffuse osteopenia. Electronically Signed   By: Ivar Drape M.D.   On: 02/26/2016 14:25    Assessment/Plan 1.  Complication dialysis device with thrombosis AV access:  Patient's left arm dialysis access is stenotic and overpressurized. The patient will undergo angiography and repair using interventional techniques. Potassium will be drawn to ensure that it is an appropriate level prior to performing intervention. 2.  End-stage renal disease requiring hemodialysis:  Patient will continue dialysis therapy without further interruption if a successful recanalization is not achieved then catheter will be placed.  3.  Hypertension:  Patient will continue medical management; nephrology is following no changes in oral medications. 4.  Coronary artery disease:  EKG will be monitored. Nitrates will be used if needed. The patient's oral cardiac medications will be continued.    Hortencia Pilar, MD  03/19/2016 12:40 PM

## 2016-03-19 NOTE — ED Provider Notes (Signed)
[facility] Emergency Department Provider Note  ____________________________________________  Time seen: Approximately 11:39 AM  I have reviewed the triage vital signs and the nursing notes.   HISTORY  Chief Complaint bleeding fistula    HPI [name] is a 82 y.o. female brought to the ED by EMS from dialysis due to persistent bleeding from her AV fistula access site.She denies any acute complaints. No trauma. No dizziness chest pain or shortness of breath. No recent illness. Denies any previous complications with this fistula. She thinks it's been in place for 18 months. She has been managed by vascular surgery Dr. Lucky Cowboy here at [facility].     Past Medical History:  Diagnosis Date  . Anemia in chronic kidney disease    IV iron infusion and anemia from GAVE blood loss.  . Arthritis     hands  . Bradycardia 2014   a. Noted 06/2012.  . Breast cancer ([place])    Left Breast 13 yrs. ago  . CAD (coronary artery disease), native coronary artery    3 vessel CAD by cath 04/2015  . Cardiac murmur    a. thought due to AVF (2D echo 06/2012 without significant valvular disease).   . Ectropion of right lower eyelid 11/2012   s/p surgery (Dr. [name])  . End stage renal disease on dialysis [facility])    HD M,W,F [place] AVF now LUE AVF  . Facial droop 1935   acquired during forceps delivery, some residual R visual loss  . Family history of adverse reaction to anesthesia    brother trouble waking up and confusion, now deceased  . GAVE (gastric antral vascular ectasia) 08/2014   s/p EGD with APC  . GERD (gastroesophageal reflux disease)   . Hearing loss    Bilateral hearing aids  . History of blood transfusion 5 weeks ago  . Hx of breast cancer 2004  . Hyperlipidemia   . Hypertension   . Hypotension    a. Associated w/ dialysis.  . Osteoporosis 11/2013   DEXA -3.4 radius  . Sight impaired    Left: wears contact. Right: diminished  peripheral vision     Patient Active Problem List   Diagnosis Date Noted  . Heme positive stool   . Chronic midline low back pain without sciatica 02/26/2016  . Osteoporosis 12/24/2015  . Forehead laceration 12/16/2015  . Fall 12/16/2015  . Advanced care planning/counseling discussion 11/06/2015  . Moderate to severe pulmonary hypertension 06/13/2015  . Abnormal chest x-ray 06/13/2015  . CAD-severe multivessel, not CABG candidate 05/06/2015  . Abnormal nuclear stress test   . Abnormal cardiovascular function study 03/06/2015  . Demand ischemia ([place])   . Acute on chronic diastolic congestive heart failure ([place]) 09/23/2014  . Exertional dyspnea   . NSTEMI (non-ST elevated myocardial infarction) ([place])   . GAVE (gastric antral vascular ectasia) 04/09/2014  . Anemia due to blood loss, chronic 04/08/2014  . Right shoulder injury 03/12/2014  . Left foot pain 01/10/2014  . Nasal congestion 11/13/2013  . Osteoarthritis, hand 11/13/2013  . Imbalance 11/16/2012  . Abnormal electrocardiogram 07/25/2012  . Mood disorder ([place]) 06/01/2012  . Systolic murmur 0000000  . History of diabetes mellitus, type II   . Hyperlipidemia   . Hypertension   . Chronic Facial droop   . Anemia   . End stage renal disease [place] dialysis [place])   . GERD (gastroesophageal reflux disease)      Past Surgical History:  Procedure Laterality Date  .  AV FISTULA PLACEMENT  07/20/10   Right brachiocephalic AVF  . BASCILIC VEIN TRANSPOSITION Left 07/24/2013   Procedure: BASILIC VEIN TRANSPOSITION;  Surgeon: [name], MD;  Location: [place];  Service: Vascular;  Laterality: Left;  . BREAST BIOPSY Left   . BREAST LUMPECTOMY Left   . CARDIAC CATHETERIZATION N/A 04/10/2015   Procedure: Left Heart Cath and Coronary Angiography;  Surgeon: [name], MD;  Location: [place] CV LAB;  Service: Cardiovascular;  Laterality: N/A;  . CATARACT EXTRACTION Bilateral 1980  . DEXA  11/2015   DEXA T -4.3 [facility]  .  ESOPHAGOGASTRODUODENOSCOPY N/A 04/09/2014   Procedure: ESOPHAGOGASTRODUODENOSCOPY (EGD);  Surgeon: [name], MD;  Location: [place];  Service: Endoscopy;  Laterality: N/A;  . ESOPHAGOGASTRODUODENOSCOPY N/A 09/23/2014   Procedure: ESOPHAGOGASTRODUODENOSCOPY (EGD);  Surgeon: [name], MD;  Location: [place];  Service: Endoscopy;  Laterality: N/A;  . ESOPHAGOGASTRODUODENOSCOPY N/A 12/26/2014   neg H pylori, benign biopsies; ESOPHAGOGASTRODUODENOSCOPY (EGD);  Surgeon: [name], MD  . ESOPHAGOGASTRODUODENOSCOPY N/A 06/15/2015   Procedure: ESOPHAGOGASTRODUODENOSCOPY (EGD);  Surgeon: Milus Banister, MD;  Location: [place];  Service: Endoscopy;  Laterality: N/A;  . ESOPHAGOGASTRODUODENOSCOPY N/A 06/27/2015   Procedure: ESOPHAGOGASTRODUODENOSCOPY (EGD);  Surgeon: Mauri Pole, MD;  Location: [facility];  Service: Endoscopy;  Laterality: N/A;  . ESOPHAGOGASTRODUODENOSCOPY (EGD) WITH PROPOFOL N/A 11/12/2014   Procedure: ESOPHAGOGASTRODUODENOSCOPY (EGD) WITH PROPOFOL;  Surgeon: [name], MD;  Location: [facility];  Service: Endoscopy;  Laterality: N/A;  . ESOPHAGOGASTRODUODENOSCOPY (EGD) WITH PROPOFOL N/A 07/31/2015   Procedure: ESOPHAGOGASTRODUODENOSCOPY (EGD) WITH PROPOFOL;  Surgeon: Mauri Pole, MD;  Location: [facility];  Service: Endoscopy;  Laterality: N/A;  . ESOPHAGOGASTRODUODENOSCOPY (EGD) WITH PROPOFOL N/A 03/08/2016   Procedure: ESOPHAGOGASTRODUODENOSCOPY (EGD) WITH PROPOFOL;  Surgeon: Mauri Pole, MD;  Location: [facility];  Service: Endoscopy;  Laterality: N/A;  . EYE SURGERY  1966   regular cataract  . HERNIA REPAIR    . HOT HEMOSTASIS N/A 11/12/2014   Procedure: HOT HEMOSTASIS (ARGON PLASMA COAGULATION/BICAP);  Surgeon: [name], MD;  Location: [facility];  Service: Endoscopy;  Laterality: N/A;  . HOT HEMOSTASIS N/A 06/27/2015   Procedure: HOT HEMOSTASIS (ARGON PLASMA COAGULATION/BICAP);  Surgeon: Mauri Pole, MD;   Location: [facility];  Service: Endoscopy;  Laterality: N/A;  . HOT HEMOSTASIS N/A 07/31/2015   Procedure: HOT HEMOSTASIS (ARGON PLASMA COAGULATION/BICAP);  Surgeon: Mauri Pole, MD;  Location: [facility];  Service: Endoscopy;  Laterality: N/A;  . PERIPHERAL VASCULAR CATHETERIZATION N/A 08/15/2014   Procedure: A/V Shuntogram/Fistulagram;  Surgeon: [name], MD;  Location: [place] CV [facility];  Service: Cardiovascular;  Laterality: N/A;  . PERIPHERAL VASCULAR CATHETERIZATION N/A 04/28/2015   Procedure: A/V Shuntogram/Fistulagram;  Surgeon: [name], MD;  Location: [place] CV [facility];  Service: Cardiovascular;  Laterality: N/A;  . PERIPHERAL VASCULAR CATHETERIZATION Left 04/28/2015   Procedure: A/V Shunt Intervention;  Surgeon: [name], MD;  Location: [place] [facility];  Service: Cardiovascular;  Laterality: Left;  . PERIPHERAL VASCULAR CATHETERIZATION Left 08/07/2015   Procedure: A/V Shuntogram/Fistulagram;  Surgeon: [name], MD;  Location: [place] CV LAB;  Service: Cardiovascular;  Laterality: Left;  . PERIPHERAL VASCULAR CATHETERIZATION N/A 08/07/2015   Procedure: A/V Shunt Intervention;  Surgeon: [name], MD;  Location: [place] CV [facility];  Service: Cardiovascular;  Laterality: N/A;  . PERIPHERAL VASCULAR CATHETERIZATION Left 11/03/2015   Procedure: A/V Shuntogram/Fistulagram;  Surgeon: [name], MD;  Location: [place]  CV LAB;  Service: Cardiovascular;  Laterality: Left;  . PERIPHERAL VASCULAR CATHETERIZATION N/A 11/03/2015   Procedure: A/V Shunt Intervention;  Surgeon: [name], MD;  Location: [place] CV LAB;  Service: Cardiovascular;  Laterality: N/A;  . SHUNTOGRAM N/A 04/16/2011   Procedure: Earney Mallet;  Surgeon: [name], MD;  Location: [place] CATH LAB;  Service: Cardiovascular;  Laterality: N/A;  . SHUNTOGRAM Right 07/05/2013   Procedure: FISTULOGRAM;  Surgeon: [name], MD;  Location: [facility] CATH LAB;  Service: Cardiovascular;   Laterality: Right;  . UMBILICAL HERNIA REPAIR  2011  . US ECHOCARDIOGRAPHY  06/2012   LVH, EF 65%, grade 1 diastol dysfunction, mod dliated LAD     Prior to Admission medications   Medication Sig Start Date End Date Taking? Authorizing Provider  acetaminophen (TYLENOL) 500 MG tablet Take 1,000 mg by mouth every 6 (six) hours as needed for moderate pain.    Historical Provider, MD  amLODipine (NORVASC) 2.5 MG tablet Take 2.5 mg by mouth at bedtime.    Historical Provider, MD  aspirin EC 81 MG tablet Take 1 tablet (81 mg total) by mouth daily. 03/06/15   Lelon Perla, MD  atorvastatin (LIPITOR) 80 MG tablet Take 80 mg by mouth at bedtime.    Historical Provider, MD  B Complex-C-Folic Acid (RENA-VITE PO) Take 1 tablet by mouth daily with breakfast.     Historical Provider, MD  Calcium Carbonate Antacid (TUMS ULTRA 1000 PO) Take 3,000 mg by mouth 3 (three) times daily after meals.     Historical Provider, MD  cholecalciferol (VITAMIN D) 1000 UNITS tablet Take 1,000 Units by mouth every Monday, Wednesday, and Friday.     Historical Provider, MD  metoprolol tartrate (LOPRESSOR) 25 MG tablet Take 0.5 tablets (12.5 mg total) by mouth 2 (two) times daily. 09/25/15   Lelon Perla, MD  pantoprazole (PROTONIX) 40 MG tablet TAKE 1 TABLET BY MOUTH TWICE A DAY 03/15/16   Mauri Pole, MD  Polyethyl Glycol-Propyl Glycol (SYSTANE) 0.4-0.3 % GEL ophthalmic gel Place 1 application into the right eye every 6 (six) hours as needed (eye dryness or pain).    Historical Provider, MD  sucralfate (CARAFATE) 1 g tablet Take 1 tablet (1 g total) by mouth 4 (four) times daily -  with meals and at bedtime. 03/08/16   Mauri Pole, MD  traMADol (ULTRAM) 50 MG tablet Take 0.5 tablets (25 mg total) by mouth 2 (two) times daily as needed for moderate pain. 02/26/16   [name], MD     Allergies Sulfa antibiotics   Family History  Problem Relation Age of Onset  . Cancer Father     stomach  . CAD  Father     MI at age 47  . Heart disease Father   . Heart attack Father   . Hypertension Mother   . Heart disease Brother     Congenital  . Hypertension Brother   . Diabetes Brother   . Cancer Sister     female (uterus?)    Social History Social History  Substance Use Topics  . Smoking status: Former Smoker    Years: 2.00    Types: Cigarettes    Quit date: 03/29/1953  . Smokeless tobacco: Never Used  . Alcohol use 0.0 oz/week     Comment: occasional    Review of Systems  Constitutional:   No fever or chills.  Cardiovascular:   No chest pain. Respiratory:   No dyspnea or cough. Gastrointestinal:  Negative for abdominal pain, vomiting and diarrhea.  . Musculoskeletal:   Negative for focal pain or swelling 10-point ROS otherwise negative.  ____________________________________________   PHYSICAL EXAM:  VITAL SIGNS: ED Triage Vitals  Enc Vitals Group     BP 03/19/16 1005 (!) 159/75     Pulse Rate 03/19/16 1005 77     Resp 03/19/16 1005 16     Temp 03/19/16 1005 97.7 F (36.5 C)     Temp Source 03/19/16 1005 Oral     SpO2 03/19/16 1005 98 %     Weight 03/19/16 1006 113 lb (51.3 kg)     Height 03/19/16 1006 4\' 9"  (1.448 m)     Head Circumference --      Peak Flow --      Pain Score --      Pain Loc --      Pain Edu? --      Excl. in [place]? --     Vital signs reviewed, nursing assessments reviewed.   Constitutional:   Alert and oriented. Well appearing and in no distress. Eyes:   No scleral icterus. No conjunctival pallor. EOMI ENT   Head:   Normocephalic and atraumatic.   Nose:   No congestion/rhinnorhea. No septal hematoma. No bleeding   Mouth/Throat:   MMM, no pharyngeal erythema. No peritonsillar mass. No bleeding   Neck:   No stridor. No SubQ emphysema. No meningismus. Hematological/Lymphatic/Immunilogical:   No cervical lymphadenopathy. Cardiovascular:   RRR. Symmetric bilateral radial and DP pulses.  No murmurs. Left upper cavity AV  fistula with thick gauze dressing in place. This was removed for exam, venous access site hemostatic. Arterial access site with brisk continuous bleeding. Strong thrill in the fistula. Respiratory:   Normal respiratory effort without tachypnea nor retractions. Breath sounds are clear and equal bilaterally. No wheezes/rales/rhonchi.  Musculoskeletal:   Nontender with normal range of motion in all extremities. No joint effusions.  No lower extremity tenderness.  No edema. Neurologic:   Normal speech and language.  CN 2-10 normal. Motor grossly intact. No gross focal neurologic deficits are appreciated.  Skin:    Skin is warm, dry with bleeding skin defect over dialysis access as above. No rash noted.  No petechiae, purpura, or bullae.  ____________________________________________    LABS (pertinent positives/negatives) (all labs ordered are listed, but only abnormal results are displayed) Labs Reviewed - No data to display ____________________________________________   EKG    ____________________________________________    RADIOLOGY    ____________________________________________   PROCEDURES Procedures Control of hemorrhage Direct pressure held on pinpoint bleeding site of the AV fistula for 10 minutes. Unable to control with fingertip pressure or fistula clamp. Hemostasis was then obtained by placing 6-0 sutures through the skin surface overlying the defect.  Using manual blood pressure cuff proximally, and some manual pressure focally over proximal blood vessel, the area was made hemostatic. I then placed one 6-0 Ethilon suture very superficially through just the skin. However, on letting down blood pressure cuff, there is persistent pulsatile bleeding just medial to the suture. 2 more per low sutures were placed in similar fashion, 6-0 Ethilon, superficial, medially to the original suture. Hemostasis achieved. There is persistent thrill in the fistula after release of  pressure. ____________________________________________   INITIAL IMPRESSION / ASSESSMENT AND PLAN / ED COURSE  Pertinent labs & imaging results that were available during my care of the patient were reviewed by me and considered in my medical decision making (see chart for details).  Patient presents with bleeding dialysis fistula. This required skin sutures to obtain hemostasis. I contacted vascular surgery who indicated that they will evaluate the patient in special procedures immediately after discharge from the emergency room. After 90 minutes of observation in the ED, she remained hemostatic. Vital signs are stable and patient remained asymptomatic. Patient to be discharged from ED, follow-up with Dr. Delana Meyer immediately before leaving the hospital.     Clinical Course    ____________________________________________   FINAL CLINICAL IMPRESSION(S) / ED DIAGNOSES  Final diagnoses:  Dialysis AV fistula malfunction, initial encounter [facility])      New Prescriptions   No medications on file     Portions of this note were generated with dragon dictation software. Dictation errors may occur despite best attempts at proofreading.    Carrie Mew, MD 03/19/16 [phone]

## 2016-03-19 NOTE — ED Notes (Signed)
Sutures placed by Dr. Joni Fears. Bleeding controlled, will continue to watch patient.

## 2016-03-19 NOTE — Discharge Instructions (Signed)
Fistulogram, Care After Introduction Refer to this sheet in the next few weeks. These instructions provide you with information on caring for yourself after your procedure. Your health care provider may also give you more specific instructions. Your treatment has been planned according to current medical practices, but problems sometimes occur. Call your health care provider if you have any problems or questions after your procedure. What can I expect after the procedure? After your procedure, it is typical to have the following:  A small amount of discomfort in the area where the catheters were placed.  A small amount of bruising around the fistula.  Sleepiness and fatigue. Follow these instructions at home:  Rest at home for the day following your procedure.  Do not drive or operate heavy machinery while taking pain medicine.  Take medicines only as directed by your health care provider.  Do not take baths, swim, or use a hot tub until your health care provider approves. You may shower 24 hours after the procedure or as directed by your health care provider.  There are many different ways to close and cover an incision, including stitches, skin glue, and adhesive strips. Follow your health care provider's instructions on:  Incision care.  Bandage (dressing) changes and removal.  Incision closure removal.  Monitor your dialysis fistula carefully. Contact a health care provider if:  You have drainage, redness, swelling, or pain at your catheter site.  You have a fever.  You have chills. Get help right away if:  You feel weak.  You have trouble balancing.  You have trouble moving your arms or legs.  You have problems with your speech or vision.  You can no longer feel a vibration or buzz when you put your fingers over your dialysis fistula.  The limb that was used for the procedure:  Swells.  Is painful.  Is cold.  Is discolored, such as blue or pale  white. This information is not intended to replace advice given to you by your health care provider. Make sure you discuss any questions you have with your health care provider. Document Released: 07/30/2013 Document Revised: 08/21/2015 Document Reviewed: 05/04/2013  2017 Elsevier Today we placed 3 simple interrupted sutures of 6-0 ethilon stiches over the bleeding area on your left upper arm to stop the bleeding from your fistula.

## 2016-03-19 NOTE — ED Triage Notes (Signed)
Pt came to ED via EMS from dialysis c/o bleeding fistula. Gauze and bandages in place on arrival. Pt denies any pain. Reports takes an aspirin a day, no other blood thinners. No previous issues with fistula.

## 2016-03-19 NOTE — Op Note (Signed)
OPERATIVE NOTE   PROCEDURE: 1. Contrast injection left arm brachiocephalic AV access 2. Percutaneous transluminal angioplasty and stent placement left arm brachiocephalic fistula at the cephalic confluence  PRE-OPERATIVE DIAGNOSIS: Complication of dialysis access                                                       End Stage Renal Disease  POST-OPERATIVE DIAGNOSIS: same as above   SURGEON: Katha Cabal, M.D.  ANESTHESIA: Conscious sedation was administered under my direct supervision. IV Versed plus fentanyl were utilized. Continuous ECG, pulse oximetry and blood pressure was monitored throughout the entire procedure.  Conscious sedation was for a total of 27 minutes.  ESTIMATED BLOOD LOSS: minimal  FINDING(S): 1. Napkin ring like lesion greater than 80% at the proximal margin of the previously placed Viabahn stents  SPECIMEN(S):  None  CONTRAST: 25 cc  FLUOROSCOPY TIME: 1.1 minutes  INDICATIONS: [name] is a 82 y.o. female who  presents with malfunctioning left arm brachiocephalic AV access. Earlier today the patient was sent to the emergency room secondary to prolonged pulsatile bleeding from her fistula. In the emergency room control was obtained with a single nylon stitch and I was contacted for further treatment. I requested the patient be transferred down to special procedures area where I discussed the reasons she was bleeding and the indications for angiography intervention.  The patient is scheduled for angiography with possible intervention of the AV access.  The patient is aware the risks include but are not limited to: bleeding, infection, thrombosis of the cannulated access, and possible anaphylactic reaction to the contrast.  The patient acknowledges if the access can not be salvaged a tunneled catheter will be needed and will be placed during this procedure.  The patient is aware of the risks of the procedure and elects to proceed with the angiogram and  intervention.  DESCRIPTION: After full informed written consent was obtained, the patient was brought back to the Special Procedure suite and placed supine position.  Appropriate cardiopulmonary monitors were placed.  The left arm was prepped and draped in the standard fashion.  Appropriate timeout is called. The left brachiocephalic fistula  was cannulated with a micropuncture needle.  Cannulation was performed with ultrasound guidance. Ultrasound was placed in a sterile sleeve, the AV access was interrogated and noted to be echolucent and compressible indicating patency. Image was recorded for the permanent record. The puncture is performed under continuous ultrasound visualization.   The microwire was advanced and the needle was exchanged for  a microsheath.  The J-wire was then advanced and a 6 Fr sheath inserted.  Hand injections were completed to image the access from the arterial anastomosis through the entire access.  The central venous structures were also imaged by hand injections.  The J-wire was then reintroduced and the sheath upsized to a 7 [place] sheath. A V-18 wire was negotiated through the strictures within the proximal portion of the cephalic vein.  The stenosis was predilated with a 7 mm balloon inflated to 18 atm for 1 minute. Follow-up imaging demonstrated significant residual stenosis. An 8 mm x 25 mm Viabahn was deployed across the stenoses and postdilated with an 8 mm [place] balloon.  Follow-up imaging demonstrates complete resolution of the stricture with rapid flow of contrast through the graft, the central venous  anatomy is preserved.  A 4-0 Monocryl purse-string suture was sewn around the sheath.  The sheath was removed and light pressure was applied.  A sterile bandage was applied to the puncture site.    COMPLICATIONS: None  CONDITION: [name], M.D [facility] Office: [phone]  03/19/2016 2:19 PM

## 2016-03-19 NOTE — ED Notes (Addendum)
Per edp pt needs to go directly from the ED to Specials to be evaluated by Dr. Lucky Cowboy.  Left arm fistula WNL, no bleeding noted. Pt will be transported over to specials room #7.

## 2016-03-21 DIAGNOSIS — D631 Anemia in chronic kidney disease: Secondary | ICD-10-CM | POA: Diagnosis not present

## 2016-03-21 DIAGNOSIS — E782 Mixed hyperlipidemia: Secondary | ICD-10-CM | POA: Diagnosis not present

## 2016-03-21 DIAGNOSIS — N186 End stage renal disease: Secondary | ICD-10-CM | POA: Diagnosis not present

## 2016-03-21 DIAGNOSIS — E1129 Type 2 diabetes mellitus with other diabetic kidney complication: Secondary | ICD-10-CM | POA: Diagnosis not present

## 2016-03-21 DIAGNOSIS — N2581 Secondary hyperparathyroidism of renal origin: Secondary | ICD-10-CM | POA: Diagnosis not present

## 2016-03-24 ENCOUNTER — Encounter: Payer: Self-pay | Admitting: Vascular Surgery

## 2016-03-24 DIAGNOSIS — E782 Mixed hyperlipidemia: Secondary | ICD-10-CM | POA: Diagnosis not present

## 2016-03-24 DIAGNOSIS — D631 Anemia in chronic kidney disease: Secondary | ICD-10-CM | POA: Diagnosis not present

## 2016-03-24 DIAGNOSIS — E1129 Type 2 diabetes mellitus with other diabetic kidney complication: Secondary | ICD-10-CM | POA: Diagnosis not present

## 2016-03-24 DIAGNOSIS — N2581 Secondary hyperparathyroidism of renal origin: Secondary | ICD-10-CM | POA: Diagnosis not present

## 2016-03-24 DIAGNOSIS — N186 End stage renal disease: Secondary | ICD-10-CM | POA: Diagnosis not present

## 2016-03-26 DIAGNOSIS — E782 Mixed hyperlipidemia: Secondary | ICD-10-CM | POA: Diagnosis not present

## 2016-03-26 DIAGNOSIS — N2581 Secondary hyperparathyroidism of renal origin: Secondary | ICD-10-CM | POA: Diagnosis not present

## 2016-03-26 DIAGNOSIS — N186 End stage renal disease: Secondary | ICD-10-CM | POA: Diagnosis not present

## 2016-03-26 DIAGNOSIS — D631 Anemia in chronic kidney disease: Secondary | ICD-10-CM | POA: Diagnosis not present

## 2016-03-26 DIAGNOSIS — E1129 Type 2 diabetes mellitus with other diabetic kidney complication: Secondary | ICD-10-CM | POA: Diagnosis not present

## 2016-03-28 ENCOUNTER — Other Ambulatory Visit
Admission: RE | Admit: 2016-03-28 | Discharge: 2016-03-28 | Disposition: A | Discharge status: Home / Self Care | Payer: Medicare Other | Source: Ambulatory Visit | Attending: Nephrology | Admitting: Nephrology

## 2016-03-28 DIAGNOSIS — E875 Hyperkalemia: Secondary | ICD-10-CM | POA: Diagnosis not present

## 2016-03-28 DIAGNOSIS — E1129 Type 2 diabetes mellitus with other diabetic kidney complication: Secondary | ICD-10-CM | POA: Diagnosis not present

## 2016-03-28 DIAGNOSIS — N2581 Secondary hyperparathyroidism of renal origin: Secondary | ICD-10-CM | POA: Diagnosis not present

## 2016-03-28 DIAGNOSIS — E782 Mixed hyperlipidemia: Secondary | ICD-10-CM | POA: Diagnosis not present

## 2016-03-28 DIAGNOSIS — D631 Anemia in chronic kidney disease: Secondary | ICD-10-CM | POA: Diagnosis not present

## 2016-03-28 DIAGNOSIS — N186 End stage renal disease: Secondary | ICD-10-CM | POA: Diagnosis not present

## 2016-03-28 DIAGNOSIS — Z992 Dependence on renal dialysis: Secondary | ICD-10-CM | POA: Diagnosis not present

## 2016-03-28 LAB — POTASSIUM: Potassium: 4 mmol/L (ref 3.5–5.1)

## 2016-03-30 ENCOUNTER — Ambulatory Visit
Admission: RE | Admit: 2016-03-30 | Discharge: 2016-03-30 | Disposition: A | Discharge status: Home / Self Care | Payer: Medicare Other | Attending: Emergency Medicine | Admitting: Emergency Medicine

## 2016-03-30 ENCOUNTER — Encounter: Payer: Self-pay | Admitting: Emergency Medicine

## 2016-03-30 ENCOUNTER — Encounter
Admission: RE | Disposition: A | Discharge status: Home / Self Care | Payer: Self-pay | Source: Home / Self Care | Attending: Emergency Medicine

## 2016-03-30 DIAGNOSIS — I132 Hypertensive heart and chronic kidney disease with heart failure and with stage 5 chronic kidney disease, or end stage renal disease: Secondary | ICD-10-CM | POA: Insufficient documentation

## 2016-03-30 DIAGNOSIS — T82858A Stenosis of vascular prosthetic devices, implants and grafts, initial encounter: Secondary | ICD-10-CM | POA: Insufficient documentation

## 2016-03-30 DIAGNOSIS — K219 Gastro-esophageal reflux disease without esophagitis: Secondary | ICD-10-CM | POA: Insufficient documentation

## 2016-03-30 DIAGNOSIS — D631 Anemia in chronic kidney disease: Secondary | ICD-10-CM | POA: Insufficient documentation

## 2016-03-30 DIAGNOSIS — I5033 Acute on chronic diastolic (congestive) heart failure: Secondary | ICD-10-CM | POA: Insufficient documentation

## 2016-03-30 DIAGNOSIS — E1122 Type 2 diabetes mellitus with diabetic chronic kidney disease: Secondary | ICD-10-CM | POA: Insufficient documentation

## 2016-03-30 DIAGNOSIS — Z833 Family history of diabetes mellitus: Secondary | ICD-10-CM | POA: Insufficient documentation

## 2016-03-30 DIAGNOSIS — N186 End stage renal disease: Secondary | ICD-10-CM | POA: Insufficient documentation

## 2016-03-30 DIAGNOSIS — E785 Hyperlipidemia, unspecified: Secondary | ICD-10-CM | POA: Insufficient documentation

## 2016-03-30 DIAGNOSIS — I2510 Atherosclerotic heart disease of native coronary artery without angina pectoris: Secondary | ICD-10-CM | POA: Insufficient documentation

## 2016-03-30 DIAGNOSIS — I10 Essential (primary) hypertension: Secondary | ICD-10-CM | POA: Diagnosis not present

## 2016-03-30 DIAGNOSIS — Z87891 Personal history of nicotine dependence: Secondary | ICD-10-CM | POA: Insufficient documentation

## 2016-03-30 DIAGNOSIS — T82868A Thrombosis of vascular prosthetic devices, implants and grafts, initial encounter: Secondary | ICD-10-CM | POA: Diagnosis not present

## 2016-03-30 DIAGNOSIS — Z9841 Cataract extraction status, right eye: Secondary | ICD-10-CM | POA: Insufficient documentation

## 2016-03-30 DIAGNOSIS — Z882 Allergy status to sulfonamides status: Secondary | ICD-10-CM | POA: Diagnosis not present

## 2016-03-30 DIAGNOSIS — I119 Hypertensive heart disease without heart failure: Secondary | ICD-10-CM | POA: Diagnosis not present

## 2016-03-30 DIAGNOSIS — I953 Hypotension of hemodialysis: Secondary | ICD-10-CM | POA: Insufficient documentation

## 2016-03-30 DIAGNOSIS — Z992 Dependence on renal dialysis: Secondary | ICD-10-CM | POA: Diagnosis not present

## 2016-03-30 DIAGNOSIS — Z9889 Other specified postprocedural states: Secondary | ICD-10-CM | POA: Insufficient documentation

## 2016-03-30 DIAGNOSIS — T82898A Other specified complication of vascular prosthetic devices, implants and grafts, initial encounter: Secondary | ICD-10-CM

## 2016-03-30 DIAGNOSIS — Z853 Personal history of malignant neoplasm of breast: Secondary | ICD-10-CM | POA: Insufficient documentation

## 2016-03-30 DIAGNOSIS — Z800 Family history of malignant neoplasm of digestive organs: Secondary | ICD-10-CM | POA: Diagnosis not present

## 2016-03-30 DIAGNOSIS — Z8249 Family history of ischemic heart disease and other diseases of the circulatory system: Secondary | ICD-10-CM | POA: Insufficient documentation

## 2016-03-30 DIAGNOSIS — Z9842 Cataract extraction status, left eye: Secondary | ICD-10-CM | POA: Diagnosis not present

## 2016-03-30 DIAGNOSIS — Y832 Surgical operation with anastomosis, bypass or graft as the cause of abnormal reaction of the patient, or of later complication, without mention of misadventure at the time of the procedure: Secondary | ICD-10-CM | POA: Diagnosis not present

## 2016-03-30 HISTORY — PX: PERIPHERAL VASCULAR CATHETERIZATION: SHX172C

## 2016-03-30 LAB — BASIC METABOLIC PANEL
Anion gap: 15 (ref 5–15)
BUN: 83 mg/dL — AB (ref 6–20)
CHLORIDE: 102 mmol/L (ref 101–111)
CO2: 25 mmol/L (ref 22–32)
CREATININE: 10.45 mg/dL — AB (ref 0.44–1.00)
Calcium: 9.2 mg/dL (ref 8.9–10.3)
GFR, EST AFRICAN AMERICAN: 3 mL/min — AB (ref >60)
GFR, EST NON AFRICAN AMERICAN: 3 mL/min — AB (ref >60)
Glucose, Bld: 168 mg/dL — ABNORMAL HIGH (ref 65–99)
POTASSIUM: 4.4 mmol/L (ref 3.5–5.1)
SODIUM: 142 mmol/L (ref 135–145)

## 2016-03-30 SURGERY — A/V FISTULAGRAM
Anesthesia: Moderate Sedation

## 2016-03-30 MED ORDER — HEPARIN SODIUM (PORCINE) 1000 UNIT/ML IJ SOLN
INTRAMUSCULAR | Status: AC
  Filled 2016-03-30: qty 1

## 2016-03-30 MED ORDER — MIDAZOLAM HCL 2 MG/2ML IJ SOLN
INTRAMUSCULAR | Status: DC | PRN
  Administered 2016-03-30: 2 mg via INTRAVENOUS

## 2016-03-30 MED ORDER — MIDAZOLAM HCL 2 MG/2ML IJ SOLN
INTRAMUSCULAR | Status: AC
  Filled 2016-03-30: qty 2

## 2016-03-30 MED ORDER — HEPARIN (PORCINE) IN NACL 2-0.9 UNIT/ML-% IJ SOLN
INTRAMUSCULAR | Status: AC
  Filled 2016-03-30: qty 1000

## 2016-03-30 MED ORDER — FENTANYL CITRATE (PF) 100 MCG/2ML IJ SOLN
INTRAMUSCULAR | Status: DC | PRN
  Administered 2016-03-30: 50 ug via INTRAVENOUS

## 2016-03-30 MED ORDER — IOPAMIDOL (ISOVUE-300) INJECTION 61%
INTRAVENOUS | Status: DC | PRN
Start: 2016-03-30 — End: 2016-03-30
  Administered 2016-03-30: 25 mL via INTRA_ARTERIAL

## 2016-03-30 MED ORDER — HEPARIN SODIUM (PORCINE) 1000 UNIT/ML IJ SOLN
INTRAMUSCULAR | Status: DC | PRN
  Administered 2016-03-30: 3000 [IU] via INTRAVENOUS

## 2016-03-30 MED ORDER — LIDOCAINE HCL (PF) 1 % IJ SOLN
INTRAMUSCULAR | Status: AC
  Filled 2016-03-30: qty 30

## 2016-03-30 MED ORDER — CEFAZOLIN IN D5W 1 GM/50ML IV SOLN
1.0000 g | Freq: Once | INTRAVENOUS | Status: AC
  Administered 2016-03-30: 1 g via INTRAVENOUS

## 2016-03-30 MED ORDER — FENTANYL CITRATE (PF) 100 MCG/2ML IJ SOLN
INTRAMUSCULAR | Status: AC
  Filled 2016-03-30: qty 2

## 2016-03-30 SURGICAL SUPPLY — 9 items
BALLN LUTONIX AV 8X40X75 (BALLOONS) ×3
BALLOON LUTONIX AV 8X40X75 (BALLOONS) IMPLANT
DEVICE PRESTO INFLATION (MISCELLANEOUS) ×2 IMPLANT
DRAPE BRACHIAL (DRAPES) ×3 IMPLANT
PACK ANGIOGRAPHY (CUSTOM PROCEDURE TRAY) ×3 IMPLANT
SET INTRO CAPELLA COAXIAL (SET/KITS/TRAYS/PACK) ×3 IMPLANT
SHEATH BRITE TIP 6FRX5.5 (SHEATH) ×3 IMPLANT
TOWEL OR 17X26 4PK STRL BLUE (TOWEL DISPOSABLE) ×3 IMPLANT
WIRE MAGIC TOR.035 180C (WIRE) ×2 IMPLANT

## 2016-03-30 NOTE — ED Provider Notes (Signed)
[facility] Emergency Department Provider Note        Time seen: ----------------------------------------- 8:10 AM on 03/30/2016 -----------------------------------------    I have reviewed the triage vital signs and the nursing notes.   HISTORY  Chief Complaint problems with fistula    HPI [name] is a 83 y.o. female who presents to the ER for possible clogged fistula. Patient states she went to dialysis on Sunday but they couldn't get her fistula to work and they told her to go to vein specialist today but states when she went today they told her to come to the ER. Her last full dialysis was on Friday. She has no other complaints.   Past Medical History:  Diagnosis Date  . Anemia in chronic kidney disease    IV iron infusion and anemia from GAVE blood loss.  . Arthritis     hands  . Bradycardia 2014   a. Noted 06/2012.  . Breast cancer ([place])    Left Breast 13 yrs. ago  . CAD (coronary artery disease), native coronary artery    3 vessel CAD by cath 04/2015  . Cardiac murmur    a. thought due to AVF (2D echo 06/2012 without significant valvular disease).   . Ectropion of right lower eyelid 11/2012   s/p surgery (Dr. [name])  . End stage renal disease on dialysis [facility])    HD M,W,F [place] AVF now LUE AVF  . Facial droop 1935   acquired during forceps delivery, some residual R visual loss  . Family history of adverse reaction to anesthesia    brother trouble waking up and confusion, now deceased  . GAVE (gastric antral vascular ectasia) 08/2014   s/p EGD with APC  . GERD (gastroesophageal reflux disease)   . Hearing loss    Bilateral hearing aids  . History of blood transfusion 5 weeks ago  . Hx of breast cancer 2004  . Hyperlipidemia   . Hypertension   . Hypotension    a. Associated w/ dialysis.  . Osteoporosis 11/2013   DEXA -3.4 radius  . Sight impaired    Left: wears contact. Right: diminished peripheral  vision    Patient Active Problem List   Diagnosis Date Noted  . Heme positive stool   . Chronic midline low back pain without sciatica 02/26/2016  . Osteoporosis 12/24/2015  . Forehead laceration 12/16/2015  . Fall 12/16/2015  . Advanced care planning/counseling discussion 11/06/2015  . Moderate to severe pulmonary hypertension 06/13/2015  . Abnormal chest x-ray 06/13/2015  . CAD-severe multivessel, not CABG candidate 05/06/2015  . Abnormal nuclear stress test   . Abnormal cardiovascular function study 03/06/2015  . Demand ischemia ([place])   . Acute on chronic diastolic congestive heart failure ([place]) 09/23/2014  . Exertional dyspnea   . NSTEMI (non-ST elevated myocardial infarction) ([place])   . GAVE (gastric antral vascular ectasia) 04/09/2014  . Anemia due to blood loss, chronic 04/08/2014  . Right shoulder injury 03/12/2014  . Left foot pain 01/10/2014  . Nasal congestion 11/13/2013  . Osteoarthritis, hand 11/13/2013  . Imbalance 11/16/2012  . Abnormal electrocardiogram 07/25/2012  . Mood disorder ([place]) 06/01/2012  . Systolic murmur 0000000  . History of diabetes mellitus, type II   . Hyperlipidemia   . Hypertension   . Chronic Facial droop   . Anemia   . End stage renal disease on dialysis ([place])   . GERD (gastroesophageal reflux disease)     Past Surgical History:  Procedure Laterality Date  . AV FISTULA PLACEMENT  07/20/10   Right brachiocephalic AVF  . BASCILIC VEIN TRANSPOSITION Left 07/24/2013   Procedure: BASILIC VEIN TRANSPOSITION;  Surgeon: [name], MD;  Location: [place];  Service: Vascular;  Laterality: Left;  . BREAST BIOPSY Left   . BREAST LUMPECTOMY Left   . CARDIAC CATHETERIZATION N/A 04/10/2015   Procedure: Left Heart Cath and Coronary Angiography;  Surgeon: [name], MD;  Location: [place] [facility];  Service: Cardiovascular;  Laterality: N/A;  . CATARACT EXTRACTION Bilateral 1980  . DEXA  11/2015   DEXA T -4.3 [facility]  .  ESOPHAGOGASTRODUODENOSCOPY N/A 04/09/2014   Procedure: ESOPHAGOGASTRODUODENOSCOPY (EGD);  Surgeon: [name], MD;  Location: [place];  Service: Endoscopy;  Laterality: N/A;  . ESOPHAGOGASTRODUODENOSCOPY N/A 09/23/2014   Procedure: ESOPHAGOGASTRODUODENOSCOPY (EGD);  Surgeon: [name], MD;  Location: [place];  Service: Endoscopy;  Laterality: N/A;  . ESOPHAGOGASTRODUODENOSCOPY N/A 12/26/2014   neg H pylori, benign biopsies; ESOPHAGOGASTRODUODENOSCOPY (EGD);  Surgeon: [name], MD  . ESOPHAGOGASTRODUODENOSCOPY N/A 06/15/2015   Procedure: ESOPHAGOGASTRODUODENOSCOPY (EGD);  Surgeon: Milus Banister, MD;  Location: [place];  Service: Endoscopy;  Laterality: N/A;  . ESOPHAGOGASTRODUODENOSCOPY N/A 06/27/2015   Procedure: ESOPHAGOGASTRODUODENOSCOPY (EGD);  Surgeon: Mauri Pole, MD;  Location: [facility];  Service: Endoscopy;  Laterality: N/A;  . ESOPHAGOGASTRODUODENOSCOPY (EGD) WITH PROPOFOL N/A 11/12/2014   Procedure: ESOPHAGOGASTRODUODENOSCOPY (EGD) WITH PROPOFOL;  Surgeon: [name], MD;  Location: [facility];  Service: Endoscopy;  Laterality: N/A;  . ESOPHAGOGASTRODUODENOSCOPY (EGD) WITH PROPOFOL N/A 07/31/2015   Procedure: ESOPHAGOGASTRODUODENOSCOPY (EGD) WITH PROPOFOL;  Surgeon: Mauri Pole, MD;  Location: [facility];  Service: Endoscopy;  Laterality: N/A;  . ESOPHAGOGASTRODUODENOSCOPY (EGD) WITH PROPOFOL N/A 03/08/2016   Procedure: ESOPHAGOGASTRODUODENOSCOPY (EGD) WITH PROPOFOL;  Surgeon: Mauri Pole, MD;  Location: [facility];  Service: Endoscopy;  Laterality: N/A;  . EYE SURGERY  1966   regular cataract  . HERNIA REPAIR    . HOT HEMOSTASIS N/A 11/12/2014   Procedure: HOT HEMOSTASIS (ARGON PLASMA COAGULATION/BICAP);  Surgeon: [name], MD;  Location: [facility];  Service: Endoscopy;  Laterality: N/A;  . HOT HEMOSTASIS N/A 06/27/2015   Procedure: HOT HEMOSTASIS (ARGON PLASMA COAGULATION/BICAP);  Surgeon: Mauri Pole, MD;   Location: [facility];  Service: Endoscopy;  Laterality: N/A;  . HOT HEMOSTASIS N/A 07/31/2015   Procedure: HOT HEMOSTASIS (ARGON PLASMA COAGULATION/BICAP);  Surgeon: Mauri Pole, MD;  Location: [facility];  Service: Endoscopy;  Laterality: N/A;  . PERIPHERAL VASCULAR CATHETERIZATION N/A 08/15/2014   Procedure: A/V Shuntogram/Fistulagram;  Surgeon: [name], MD;  Location: [place] [facility];  Service: Cardiovascular;  Laterality: N/A;  . PERIPHERAL VASCULAR CATHETERIZATION N/A 04/28/2015   Procedure: A/V Shuntogram/Fistulagram;  Surgeon: [name], MD;  Location: [place] [facility];  Service: Cardiovascular;  Laterality: N/A;  . PERIPHERAL VASCULAR CATHETERIZATION Left 04/28/2015   Procedure: A/V Shunt Intervention;  Surgeon: [name], MD;  Location: [place] [facility];  Service: Cardiovascular;  Laterality: Left;  . PERIPHERAL VASCULAR CATHETERIZATION Left 08/07/2015   Procedure: A/V Shuntogram/Fistulagram;  Surgeon: [name], MD;  Location: [place] [facility];  Service: Cardiovascular;  Laterality: Left;  . PERIPHERAL VASCULAR CATHETERIZATION N/A 08/07/2015   Procedure: A/V Shunt Intervention;  Surgeon: [name], MD;  Location: [place] [facility];  Service: Cardiovascular;  Laterality: N/A;  . PERIPHERAL VASCULAR CATHETERIZATION Left 11/03/2015   Procedure: A/V Shuntogram/Fistulagram;  Surgeon: [name],  MD;  Location: [place] [facility];  Service: Cardiovascular;  Laterality: Left;  . PERIPHERAL VASCULAR CATHETERIZATION N/A 11/03/2015   Procedure: A/V Shunt Intervention;  Surgeon: [name], MD;  Location: [place] [facility];  Service: Cardiovascular;  Laterality: N/A;  . PERIPHERAL VASCULAR CATHETERIZATION N/A 03/19/2016   Procedure: A/V Fistulagram;  Surgeon: Katha Cabal, MD;  Location: [place] [facility];  Service: Cardiovascular;  Laterality: N/A;  . SHUNTOGRAM N/A 04/16/2011   Procedure: Earney Mallet;  Surgeon: [name], MD;  Location: [facility] C[facility]  LAB;  Service: Cardiovascular;  Laterality: N/A;  . SHUNTOGRAM Right 07/05/2013   Procedure: FISTULOGRAM;  Surgeon: [name], MD;  Location: [facility] CATH LAB;  Service: Cardiovascular;  Laterality: Right;  . UMBILICAL HERNIA REPAIR  2011  . US ECHOCARDIOGRAPHY  06/2012   LVH, EF 65%, grade 1 diastol dysfunction, mod dliated LAD    Allergies Sulfa antibiotics  Social History Social History  Substance Use Topics  . Smoking status: Former Smoker    Years: 2.00    Types: Cigarettes    Quit date: 03/29/1953  . Smokeless tobacco: Never Used  . Alcohol use 0.0 oz/week     Comment: occasional    Review of Systems Constitutional: Negative for fever. Cardiovascular: Negative for chest pain. Respiratory: Negative for shortness of breath. Gastrointestinal: Negative for abdominal pain, vomiting and diarrhea. Skin: Positive for left arm contusion Neurological: Negative for headaches, focal weakness or numbness.  10-point ROS otherwise negative.  ____________________________________________   PHYSICAL EXAM:  VITAL SIGNS: ED Triage Vitals [03/30/16 0754]  Enc Vitals Group     BP (!) 185/78     Pulse Rate 84     Resp 20     Temp 97.6 F (36.4 C)     Temp Source Oral     SpO2 95 %     Weight 113 lb (51.3 kg)     Height 4\' 9"  (1.448 m)     Head Circumference      Peak Flow      Pain Score      Pain Loc      Pain Edu?      Excl. in [place]?    Constitutional: Alert and oriented. Well appearing and in no distress. Cardiovascular: Normal rate, regular rhythm. No murmurs, rubs, or gallops. Palpable thrill and audible bruit in the left AV fistula Respiratory: Normal respiratory effort without tachypnea nor retractions. Breath sounds are clear and equal bilaterally. No wheezes/rales/rhonchi. Gastrointestinal: Soft and nontender. Normal bowel sounds Musculoskeletal: Nontender with normal range of motion in all extremities. No lower extremity tenderness nor edema. Skin: Left arm  ecchymosis is noted Psychiatric: Mood and affect are normal. Speech and behavior are normal.  ____________________________________________  ED COURSE:  Pertinent labs & imaging results that were available during my care of the patient were reviewed by me and considered in my medical decision making (see chart for details). Clinical Course   Patient presents the ER for possible AV fistula malfunction. We will discuss with vascular surgery and/or nephrology.  Patient has been scheduled for a fistulogram today. Case is been discussed with vascular surgery. She is medically stable at this time. Procedures ____________________________________________   LABS (pertinent positives/negatives)  Labs Reviewed  BASIC METABOLIC PANEL - Abnormal; Notable for the following:       Result Value   Glucose, Bld 168 (*)    BUN 83 (*)    Creatinine, Ser 10.45 (*)    GFR calc non Af  Amer 3 (*)    GFR calc Af Amer 3 (*)    All other components within normal limits    ____________________________________________  FINAL ASSESSMENT AND PLAN  A/V fistula dysfunction  Plan: Patient with labs as dictated above. Patient is in no distress and is scheduled for a fistulogram due to left AV fistula dysfunction.   Earleen Newport, MD   Note: This dictation was prepared with Dragon dictation. Any transcriptional errors that result from this process are unintentional    Earleen Newport, MD 03/30/16 [phone]

## 2016-03-30 NOTE — ED Notes (Signed)
2 Unsuccessful blood draw attempts from rt FA. Lab called to obtain labs.

## 2016-03-30 NOTE — Op Note (Signed)
OPERATIVE NOTE   PROCEDURE: 1. Contrast injection left arm brachial cephalic  AV access 2. Percutaneous transluminal angioplasty midportion left arm brachycephalic AV fistula  PRE-OPERATIVE DIAGNOSIS: Complication of dialysis access                                                       End Stage Renal Disease  POST-OPERATIVE DIAGNOSIS: same as above   SURGEON: Katha Cabal, M.D.  ANESTHESIA: Conscious sedation was administered under my direct supervision. IV Versed plus fentanyl were utilized. Continuous ECG, pulse oximetry and blood pressure was monitored throughout the entire procedure.  Conscious sedation was for a total of 20.  ESTIMATED BLOOD LOSS: minimal  FINDING(S): Stricture of the AV graft  SPECIMEN(S):  None  CONTRAST: 35 cc  FLUOROSCOPY TIME: 0.8 minutes  INDICATIONS: [name] is a 83 y.o. female who  presents with malfunctioning left arm brachiocephalic fistula as her AV access.  The patient is scheduled for angiography with possible intervention of the AV access.  The patient is aware the risks include but are not limited to: bleeding, infection, thrombosis of the cannulated access, and possible anaphylactic reaction to the contrast.  The patient acknowledges if the access can not be salvaged a tunneled catheter will be needed and will be placed during this procedure.  The patient is aware of the risks of the procedure and elects to proceed with the angiogram and intervention.  DESCRIPTION: After full informed written consent was obtained, the patient was brought back to the Special Procedure suite and placed supine position.  Appropriate cardiopulmonary monitors were placed.  The left arm was prepped and draped in the standard fashion.  Appropriate timeout is called. The left brachiocephalic fistula  was cannulated with a micropuncture needle.  Cannulation was performed with ultrasound guidance. Ultrasound was placed in a sterile sleeve, the AV access was  interrogated and noted to be echolucent and compressible indicating patency. Image was recorded for the permanent record. The puncture is performed under continuous ultrasound visualization.   The microwire was advanced and the needle was exchanged for  a microsheath.  The J-wire was then advanced and a 6 Fr sheath inserted.  Hand injections were completed to image the access from the arterial anastomosis through the entire access.  The central venous structures were also imaged by hand injections.  Based on the images,  3000 units of heparin was given and a wire was negotiated through the strictures within the venous portion of the graft.  A 8 x 40 mm Lutonix balloon was used.  Inflation was to 12 atm for 2 minutes.  Follow-up imaging demonstrates complete resolution of the stricture with rapid flow of contrast through the graft, the central venous anatomy is preserved.  A 4-0 Monocryl purse-string suture was sewn around the sheath.  The sheath was removed and light pressure was applied.  A sterile bandage was applied to the puncture site.    COMPLICATIONS: None  CONDITION: [name], M.D [facility] Office: [phone]  03/30/2016 3:09 PM

## 2016-03-30 NOTE — Discharge Instructions (Signed)
Fistulogram, Care After °Introduction °Refer to this sheet in the next few weeks. These instructions provide you with information on caring for yourself after your procedure. Your health care provider may also give you more specific instructions. Your treatment has been planned according to current medical practices, but problems sometimes occur. Call your health care provider if you have any problems or questions after your procedure. °What can I expect after the procedure? °After your procedure, it is typical to have the following: °· A small amount of discomfort in the area where the catheters were placed. °· A small amount of bruising around the fistula. °· Sleepiness and fatigue. °Follow these instructions at home: °· Rest at home for the day following your procedure. °· Do not drive or operate heavy machinery while taking pain medicine. °· Take medicines only as directed by your health care provider. °· Do not take baths, swim, or use a hot tub until your health care provider approves. You may shower 24 hours after the procedure or as directed by your health care provider. °· There are many different ways to close and cover an incision, including stitches, skin glue, and adhesive strips. Follow your health care provider's instructions on: °¨ Incision care. °¨ Bandage (dressing) changes and removal. °¨ Incision closure removal. °· Monitor your dialysis fistula carefully. °Contact a health care provider if: °· You have drainage, redness, swelling, or pain at your catheter site. °· You have a fever. °· You have chills. °Get help right away if: °· You feel weak. °· You have trouble balancing. °· You have trouble moving your arms or legs. °· You have problems with your speech or vision. °· You can no longer feel a vibration or buzz when you put your fingers over your dialysis fistula. °· The limb that was used for the procedure: °¨ Swells. °¨ Is painful. °¨ Is cold. °¨ Is discolored, such as blue or pale  white. °This information is not intended to replace advice given to you by your health care provider. Make sure you discuss any questions you have with your health care provider. °Document Released: 07/30/2013 Document Revised: 08/21/2015 Document Reviewed: 05/04/2013 °© 2017 Elsevier ° °

## 2016-03-30 NOTE — ED Triage Notes (Signed)
Pt to ed with c/o "clogged fistula"  Pt states she went to dialysis on Sunday but they couldn't get her fistula to work and then told her to go to her vein specialists today, but states when she went today they told her to come to ED.  Last full dialysis was Friday.

## 2016-03-30 NOTE — Progress Notes (Signed)
Pt stable upon discharge. Pt knows to go to dialysis, and new appt for follow up with DR.Marland Kitchen Dressing dry and intact. No bleeding noted. Pt will be picked up by family.  Pt is pain free and has no complaints. tol procedure well.

## 2016-03-30 NOTE — Progress Notes (Signed)
Pt back from procedure. Sleepy at first, then pt wakes up. tol sip of soda. Pt warm and dry, resp regular and unlabored. Band aide dressing to left upper arm fistula dry and intact. No drainage or blood noted.

## 2016-03-30 NOTE — H&P (Signed)
[facility] Admission History & Physical  MRN : [id number]  [name] is a 83 y.o. (03-15-1932) female who presents with chief complaint of  Chief Complaint  Patient presents with  . problems with fistula  .  History of Present Illness: I am asked to evaluate the patient by the dialysis center. The patient was sent here because they were unable to achieve adequate dialysis this morning. Furthermore the Center states there is very poor thrill and bruit. The patient states there there have been increasing problems with the access, such as "pulling clots" during dialysis and prolonged bleeding after decannulation. The patient estimates these problems have been going on for several weeks. The patient is unaware of any other change.  Patient denies pain or tenderness overlying the access.  There is no pain with dialysis.  The patient denies hand pain or finger pain consistent with steal syndrome.   There have been  past interventions but no declots of this access.  The patient is chronically hypotensive on dialysis.  No current facility-administered medications for this encounter.     Past Medical History:  Diagnosis Date  . Anemia in chronic kidney disease    IV iron infusion and anemia from GAVE blood loss.  . Arthritis     hands  . Bradycardia 2014   a. Noted 06/2012.  . Breast cancer ([place])    Left Breast 13 yrs. ago  . CAD (coronary artery disease), native coronary artery    3 vessel CAD by cath 04/2015  . Cardiac murmur    a. thought due to AVF (2D echo 06/2012 without significant valvular disease).   . Ectropion of right lower eyelid 11/2012   s/p surgery (Dr. [name])  . End stage renal disease on dialysis [facility])    HD M,W,F [place] AVF now LUE AVF  . Facial droop 1935   acquired during forceps delivery, some residual R visual loss  . Family history of adverse reaction to anesthesia    brother trouble waking up and confusion,  now deceased  . GAVE (gastric antral vascular ectasia) 08/2014   s/p EGD with APC  . GERD (gastroesophageal reflux disease)   . Hearing loss    Bilateral hearing aids  . History of blood transfusion 5 weeks ago  . Hx of breast cancer 2004  . Hyperlipidemia   . Hypertension   . Hypotension    a. Associated w/ dialysis.  . Osteoporosis 11/2013   DEXA -3.4 radius  . Sight impaired    Left: wears contact. Right: diminished peripheral vision    Past Surgical History:  Procedure Laterality Date  . AV FISTULA PLACEMENT  07/20/10   Right brachiocephalic AVF  . BASCILIC VEIN TRANSPOSITION Left 07/24/2013   Procedure: BASILIC VEIN TRANSPOSITION;  Surgeon: [name], MD;  Location: [place];  Service: Vascular;  Laterality: Left;  . BREAST BIOPSY Left   . BREAST LUMPECTOMY Left   . CARDIAC CATHETERIZATION N/A 04/10/2015   Procedure: Left Heart Cath and Coronary Angiography;  Surgeon: [name], MD;  Location: [place] [facility];  Service: Cardiovascular;  Laterality: N/A;  . CATARACT EXTRACTION Bilateral 1980  . DEXA  11/2015   DEXA T -4.3 [facility]  . ESOPHAGOGASTRODUODENOSCOPY N/A 04/09/2014   Procedure: ESOPHAGOGASTRODUODENOSCOPY (EGD);  Surgeon: [name], MD;  Location: [place];  Service: Endoscopy;  Laterality: N/A;  . ESOPHAGOGASTRODUODENOSCOPY N/A 09/23/2014   Procedure: ESOPHAGOGASTRODUODENOSCOPY (EGD);  Surgeon: [name], MD;  Location: [facility];  Service: Endoscopy;  Laterality: N/A;  . ESOPHAGOGASTRODUODENOSCOPY N/A 12/26/2014   neg H pylori, benign biopsies; ESOPHAGOGASTRODUODENOSCOPY (EGD);  Surgeon: [name], MD  . ESOPHAGOGASTRODUODENOSCOPY N/A 06/15/2015   Procedure: ESOPHAGOGASTRODUODENOSCOPY (EGD);  Surgeon: Milus Banister, MD;  Location: [place];  Service: Endoscopy;  Laterality: N/A;  . ESOPHAGOGASTRODUODENOSCOPY N/A 06/27/2015   Procedure: ESOPHAGOGASTRODUODENOSCOPY (EGD);  Surgeon: Mauri Pole, MD;  Location[facility];   Service: Endoscopy;  Laterality: N/A;  . ESOPHAGOGASTRODUODENOSCOPY (EGD) WITH PROPOFOL N/A 11/12/2014   Procedure: ESOPHAGOGASTRODUODENOSCOPY (EGD) WITH PROPOFOL;  Surgeon: [name], MD;  Location: [facility];  Service: Endoscopy;  Laterality: N/A;  . ESOPHAGOGASTRODUODENOSCOPY (EGD) WITH PROPOFOL N/A 07/31/2015   Procedure: ESOPHAGOGASTRODUODENOSCOPY (EGD) WITH PROPOFOL;  Surgeon: Mauri Pole, MD;  Location: [facility];  Service: Endoscopy;  Laterality: N/A;  . ESOPHAGOGASTRODUODENOSCOPY (EGD) WITH PROPOFOL N/A 03/08/2016   Procedure: ESOPHAGOGASTRODUODENOSCOPY (EGD) WITH PROPOFOL;  Surgeon: Mauri Pole, MD;  Location: [facility];  Service: Endoscopy;  Laterality: N/A;  . EYE SURGERY  1966   regular cataract  . HERNIA REPAIR    . HOT HEMOSTASIS N/A 11/12/2014   Procedure: HOT HEMOSTASIS (ARGON PLASMA COAGULATION/BICAP);  Surgeon: [name], MD;  Location: [facility];  Service: Endoscopy;  Laterality: N/A;  . HOT HEMOSTASIS N/A 06/27/2015   Procedure: HOT HEMOSTASIS (ARGON PLASMA COAGULATION/BICAP);  Surgeon: Mauri Pole, MD;  Location: [facility];  Service: Endoscopy;  Laterality: N/A;  . HOT HEMOSTASIS N/A 07/31/2015   Procedure: HOT HEMOSTASIS (ARGON PLASMA COAGULATION/BICAP);  Surgeon: Mauri Pole, MD;  Location: [facility];  Service: Endoscopy;  Laterality: N/A;  . PERIPHERAL VASCULAR CATHETERIZATION N/A 08/15/2014   Procedure: A/V Shuntogram/Fistulagram;  Surgeon: [name], MD;  Location: [place] [facility];  Service: Cardiovascular;  Laterality: N/A;  . PERIPHERAL VASCULAR CATHETERIZATION N/A 04/28/2015   Procedure: A/V Shuntogram/Fistulagram;  Surgeon: [name], MD;  Location: [place] [facility];  Service: Cardiovascular;  Laterality: N/A;  . PERIPHERAL VASCULAR CATHETERIZATION Left 04/28/2015   Procedure: A/V Shunt Intervention;  Surgeon: [name], MD;  Location: [place] [facility];  Service: Cardiovascular;  Laterality: Left;   . PERIPHERAL VASCULAR CATHETERIZATION Left 08/07/2015   Procedure: A/V Shuntogram/Fistulagram;  Surgeon: [name], MD;  Location: [place] [facility];  Service: Cardiovascular;  Laterality: Left;  . PERIPHERAL VASCULAR CATHETERIZATION N/A 08/07/2015   Procedure: A/V Shunt Intervention;  Surgeon: [name], MD;  Location: [place] [facility];  Service: Cardiovascular;  Laterality: N/A;  . PERIPHERAL VASCULAR CATHETERIZATION Left 11/03/2015   Procedure: A/V Shuntogram/Fistulagram;  Surgeon: [name], MD;  Location: [place] [facility];  Service: Cardiovascular;  Laterality: Left;  . PERIPHERAL VASCULAR CATHETERIZATION N/A 11/03/2015   Procedure: A/V Shunt Intervention;  Surgeon: [name], MD;  Location: [place] [facility];  Service: Cardiovascular;  Laterality: N/A;  . PERIPHERAL VASCULAR CATHETERIZATION N/A 03/19/2016   Procedure: A/V Fistulagram;  Surgeon: Katha Cabal, MD;  Location: [place] [facility];  Service: Cardiovascular;  Laterality: N/A;  . SHUNTOGRAM N/A 04/16/2011   Procedure: [facility] Mal[place];  Surgeon: [name], MD;  Location[facility];  Service: Cardiovascular;  Laterality: N/A;  . SHUNTOGRAM Right 07/05/2013   Procedure: FISTULOGRAM;  Surgeon: [name], MD;  Location: [facility] CATH LAB;  Service: Cardiovascular;  Laterality: Right;  . UMBILICAL HERNIA REPAIR  2011  . US ECHOCARDIOGRAPHY  06/2012   LVH, EF 65%, grade 1 diastol dysfunction, mod  dliated LAD    Social History Social History  Substance Use Topics  . Smoking status: Former Smoker    Years: 2.00    Types: Cigarettes    Quit date: 03/29/1953  . Smokeless tobacco: Never Used  . Alcohol use 0.0 oz/week     Comment: occasional    Family History Family History  Problem Relation Age of Onset  . Cancer Father     stomach  . CAD Father     MI at age 53  . Heart disease Father   . Heart attack Father   . Hypertension Mother   . Heart disease Brother     Congenital  . Hypertension Brother    . Diabetes Brother   . Cancer Sister     female (uterus?)    No family history of bleeding or clotting disorders, autoimmune disease or porphyria  Allergies  Allergen Reactions  . Sulfa Antibiotics Rash and Other (See Comments)    fever     REVIEW OF SYSTEMS (Negative unless checked)  Constitutional: [] Weight loss  [] Fever  [] Chills Cardiac: [] Chest pain   [] Chest pressure   [] Palpitations   [] Shortness of breath when laying flat   [] Shortness of breath at rest   [x] Shortness of breath with exertion. Vascular:  [] Pain in legs with walking   [] Pain in legs at rest   [] Pain in legs when laying flat   [] Claudication   [] Pain in feet when walking  [] Pain in feet at rest  [] Pain in feet when laying flat   [] History of DVT   [] Phlebitis   [] Swelling in legs   [] Varicose veins   [] Non-healing ulcers Pulmonary:   [] Uses home oxygen   [] Productive cough   [] Hemoptysis   [] Wheeze  [] COPD   [] Asthma Neurologic:  [] Dizziness  [] Blackouts   [] Seizures   [] History of stroke   [] History of TIA  [] Aphasia   [] Temporary blindness   [] Dysphagia   [] Weakness or numbness in arms   [] Weakness or numbness in legs Musculoskeletal:  [] Arthritis   [] Joint swelling   [] Joint pain   [] Low back pain Hematologic:  [] Easy bruising  [] Easy bleeding   [] Hypercoagulable state   [] Anemic  [] Hepatitis Gastrointestinal:  [] Blood in stool   [] Vomiting blood  [] Gastroesophageal reflux/heartburn   [] Difficulty swallowing. Genitourinary:  [x] Chronic kidney disease   [] Difficult urination  [] Frequent urination  [] Burning with urination   [] Blood in urine Skin:  [] Rashes   [] Ulcers   [] Wounds Psychological:  [] History of anxiety   []  History of major depression.  Physical Examination  Vitals:   03/30/16 0754 03/30/16 1216 03/30/16 1226 03/30/16 1232  BP: (!) 185/78 (!) 161/84  (!) 180/77  Pulse: 84 84    Resp: 20 20 18    Temp: 97.6 F (36.4 C)  97.7 F (36.5 C)   TempSrc: Oral  Oral   SpO2: 95% 97% 96%   Weight: 51.3  kg (113 lb)     Height: 4\' 9"  (1.448 m)      Body mass index is 24.45 kg/m. Gen: WD/WN, NAD Head: [place]/AT, No temporalis wasting. Prominent temp pulse not noted. Ear/Nose/Throat: Hearing grossly intact, nares w/o erythema or drainage, oropharynx w/o Erythema/Exudate,  Eyes: Conjunctiva clear, sclera non-icteric Neck: Trachea midline.  No JVD.  Pulmonary:  Good air movement, respirations not labored, no use of accessory muscles.  Cardiac: RRR, normal S1, S2. Vascular: Left brachiocephalic fistula good thrill and good bruit Vessel Right Left  Radial Palpable Palpable  Ulnar Not Palpable  Not Palpable  Brachial Palpable Palpable  Carotid Palpable, without bruit Palpable, without bruit  Gastrointestinal: soft, non-tender/non-distended. No guarding/reflex.  Musculoskeletal: M/S 5/5 throughout.  Extremities without ischemic changes.  No deformity or atrophy.  Neurologic: Sensation grossly intact in extremities.  Symmetrical.  Speech is fluent. Motor exam as listed above. Psychiatric: Judgment intact, Mood & affect appropriate for pt's clinical situation. Dermatologic: No rashes or ulcers noted.  No cellulitis or open wounds. Lymph : No Cervical, Axillary, or Inguinal lymphadenopathy.   CBC Lab Results  Component Value Date   WBC 5.9 10/30/2015   HGB 10.8 (L) 10/30/2015   HCT 34.3 (L) 10/30/2015   MCV 88.7 10/30/2015   PLT 245.0 10/30/2015    BMET    Component Value Date/Time   NA 142 03/30/2016 0853   NA 136 10/23/2013 1504   K 4.4 03/30/2016 0853   K 3.7 11/07/2013 1055   K 5.1 07/12/2012   CL 102 03/30/2016 0853   CL 98 10/23/2013 1504   CO2 25 03/30/2016 0853   CO2 30 10/23/2013 1504   GLUCOSE 168 (H) 03/30/2016 0853   GLUCOSE 231 (H) 10/23/2013 1504   BUN 83 (H) 03/30/2016 0853   BUN 49 (H) 10/23/2013 1504   CREATININE 10.45 (H) 03/30/2016 0853   CREATININE 5.95 (H) 10/23/2013 1504   CREATININE 6.9 02/14/2013   CALCIUM 9.2 03/30/2016 0853   CALCIUM 9.0 10/23/2013  1504   GFRNONAA 3 (L) 03/30/2016 0853   GFRNONAA 6 (L) 10/23/2013 1504   GFRAA 3 (L) 03/30/2016 0853   GFRAA 7 (L) 10/23/2013 1504   Estimated Creatinine Clearance: 2.9 mL/min (by C-G formula based on SCr of 10.45 mg/dL (H)).  COAG Lab Results  Component Value Date   INR 1.04 06/19/2015   INR 1.00 04/09/2015   INR 1.02 09/22/2014    Radiology No results found.  Assessment/Plan 1.  Complication dialysis device with thrombosis AV access:  Patient's Left brachiocephalic fistula dialysis access is malfunctioning. The patient will undergo angiography and correction of any problems using interventional techniques with the hope of restoring function to the access.  The risks and benefits were described to the patient.  All questions were answered.  The patient agrees to proceed with angiography and intervention. Potassium will be drawn to ensure that it is an appropriate level prior to performing intervention. 2.  End-stage renal disease requiring hemodialysis:  Patient will continue dialysis therapy without further interruption if a successful intervention is not achieved then a tunneled catheter will be placed. Dialysis has already been arranged. 3.  Hypertension:  Patient will continue medical management; nephrology is following no changes in oral medications. 4. Diabetes mellitus:  Glucose will be monitored and oral medications been held this morning once the patient has undergone the patient's procedure po intake will be reinitiated and again Accu-Cheks will be used to assess the blood glucose level and treat as needed. The patient will be restarted on the patient's usual hypoglycemic regime 5.  Coronary artery disease:  EKG will be monitored. Nitrates will be used if needed. The patient's oral cardiac medications will be continued.    Hortencia Pilar, MD  03/30/2016 12:43 PM

## 2016-03-30 NOTE — ED Notes (Signed)
Pt discharged to [facility] for Fistulagram at this time.

## 2016-03-31 ENCOUNTER — Encounter: Payer: Self-pay | Admitting: Vascular Surgery

## 2016-03-31 DIAGNOSIS — E1129 Type 2 diabetes mellitus with other diabetic kidney complication: Secondary | ICD-10-CM | POA: Diagnosis not present

## 2016-03-31 DIAGNOSIS — D631 Anemia in chronic kidney disease: Secondary | ICD-10-CM | POA: Diagnosis not present

## 2016-03-31 DIAGNOSIS — N2581 Secondary hyperparathyroidism of renal origin: Secondary | ICD-10-CM | POA: Diagnosis not present

## 2016-03-31 DIAGNOSIS — E782 Mixed hyperlipidemia: Secondary | ICD-10-CM | POA: Diagnosis not present

## 2016-03-31 DIAGNOSIS — N186 End stage renal disease: Secondary | ICD-10-CM | POA: Diagnosis not present

## 2016-04-06 ENCOUNTER — Other Ambulatory Visit: Payer: Self-pay | Admitting: Family Medicine

## 2016-04-06 MED ORDER — TRAMADOL HCL 50 MG PO TABS: 25.0000 mg | ORAL_TABLET | Freq: Two times a day (BID) | ORAL | 0 refills | Status: DC | PRN

## 2016-04-06 NOTE — Progress Notes (Signed)
Pt seen at friend's visit, requests tramadol refilled.

## 2016-04-08 NOTE — Telephone Encounter (Signed)
Patient was advised and had an appointment on 03/04/16 and procedure on 03/08/16

## 2016-04-20 ENCOUNTER — Encounter (INDEPENDENT_AMBULATORY_CARE_PROVIDER_SITE_OTHER): Payer: Self-pay | Admitting: Vascular Surgery

## 2016-04-20 ENCOUNTER — Other Ambulatory Visit (INDEPENDENT_AMBULATORY_CARE_PROVIDER_SITE_OTHER): Payer: Self-pay | Admitting: Vascular Surgery

## 2016-04-20 ENCOUNTER — Ambulatory Visit (INDEPENDENT_AMBULATORY_CARE_PROVIDER_SITE_OTHER): Payer: Medicare Other

## 2016-04-20 ENCOUNTER — Ambulatory Visit (INDEPENDENT_AMBULATORY_CARE_PROVIDER_SITE_OTHER): Payer: Medicare Other | Admitting: Vascular Surgery

## 2016-04-20 VITALS — BP 147/81 | HR 80 | Resp 16 | Ht <= 58 in | Wt 114.0 lb

## 2016-04-20 DIAGNOSIS — Z992 Dependence on renal dialysis: Secondary | ICD-10-CM

## 2016-04-20 DIAGNOSIS — N186 End stage renal disease: Secondary | ICD-10-CM

## 2016-04-20 DIAGNOSIS — Z8639 Personal history of other endocrine, nutritional and metabolic disease: Secondary | ICD-10-CM | POA: Diagnosis not present

## 2016-04-20 DIAGNOSIS — E785 Hyperlipidemia, unspecified: Secondary | ICD-10-CM | POA: Diagnosis not present

## 2016-04-20 NOTE — Progress Notes (Signed)
Subjective:    Patient ID: [name], female    DOB: [birth year], 83 y.o.   MRN: [id number] Chief Complaint  Patient presents with  . Re-evaluation    3 week ultrasound   Patient presents for her first post-procedure follow up. She is s/p a shuntogram on 03/30/16 for a poorly functioning HeRO graft. Patient presents today with improvement in her graft s/p the intervention. The patient underwent a duplex ultrasound of the AV access which was notable for a patent graft without any significant hemodynamic stenosis. The patient denies any issues with hemodialysis such as cannulation problems, increased bleeding, decrease in doppler flow or recirculation. The patient also denies any graft skin breakdown, pain, edema, pallor or ulceration of the arm / hand.   Review of Systems  Constitutional: Negative.   HENT: Negative.   Eyes: Negative.   Respiratory: Negative.   Cardiovascular: Negative.   Gastrointestinal: Negative.   Endocrine: Negative.   Genitourinary:       ESRD  Musculoskeletal: Negative.   Skin: Negative.   Allergic/Immunologic: Negative.   Neurological: Negative.   Hematological: Negative.   Psychiatric/Behavioral: Negative.        Objective:   Physical Exam  Constitutional: She is oriented to person, place, and time. She appears well-developed and well-nourished.  HENT:  Head: Normocephalic and atraumatic.  Right Ear: External ear normal.  Left Ear: External ear normal.  Eyes: Conjunctivae and EOM are normal. Pupils are equal, round, and reactive to light.  Neck: Normal range of motion.  Cardiovascular: Normal rate, regular rhythm, normal heart sounds and intact distal pulses.   Pulses:      Radial pulses are 2+ on the right side, and 2+ on the left side.       Dorsalis pedis pulses are 2+ on the right side, and 2+ on the left side.       Posterior tibial pulses are 2+ on the right side, and 2+ on the left side.  Left Upper Extremity Graft: Skin intact. Good bruit  and thrill noted.   Pulmonary/Chest: Effort normal and breath sounds normal.  Abdominal: Soft. Bowel sounds are normal.  Musculoskeletal: Normal range of motion. She exhibits no edema.  Neurological: She is alert and oriented to person, place, and time.  Skin: Skin is warm and dry.  Psychiatric: She has a normal mood and affect. Her behavior is normal. Judgment and thought content normal.   BP (!) 147/81 (BP Location: Right Arm)   Pulse 80   Resp 16   Ht 4\' 9"  (1.448 m)   Wt 114 lb (51.7 kg)   BMI 24.67 kg/m   Past Medical History:  Diagnosis Date  . Anemia in chronic kidney disease    IV iron infusion and anemia from GAVE blood loss.  . Arthritis     hands  . Bradycardia 2014   a. Noted 06/2012.  . Breast cancer ([place])    Left Breast 13 yrs. ago  . CAD (coronary artery disease), native coronary artery    3 vessel CAD by cath 04/2015  . Cardiac murmur    a. thought due to AVF (2D echo 06/2012 without significant valvular disease).   . Ectropion of right lower eyelid 11/2012   s/p surgery (Dr. [name])  . End stage renal disease on dialysis [facility])    HD M,W,F [place] AVF now LUE AVF  . Facial droop 1935   acquired during forceps delivery, some residual R visual loss  .  Family history of adverse reaction to anesthesia    brother trouble waking up and confusion, now deceased  . GAVE (gastric antral vascular ectasia) 08/2014   s/p EGD with APC  . GERD (gastroesophageal reflux disease)   . Hearing loss    Bilateral hearing aids  . History of blood transfusion 5 weeks ago  . Hx of breast cancer 2004  . Hyperlipidemia   . Hypertension   . Hypotension    a. Associated w/ dialysis.  . Osteoporosis 11/2013   DEXA -3.4 radius  . Sight impaired    Left: wears contact. Right: diminished peripheral vision   Social History   Social History  . Marital status: Single    Spouse name: N/A  . Number of children: N/A  . Years of education: N/A   Occupational  History  . 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      retired   Social History Main Topics  . Smoking status: Former Smoker    Years: 2.00    Types: Cigarettes    Quit date: 03/29/1953  . Smokeless tobacco: Never Used  . Alcohol use 0.0 oz/week     Comment: occasional  . Drug use: No  . Sexual activity: No   Other Topics Concern  . Not on file   Social History Narrative   As of 03/2013. Lives alone with her small dog.  Bother and sister in law died last year.     Occupation: retired, was Information systems manager for [employer]   Edu: some college   Past Surgical History:  Procedure Laterality Date  . AV FISTULA PLACEMENT  07/20/10   Right brachiocephalic AVF  . BASCILIC VEIN TRANSPOSITION Left 07/24/2013   Procedure: BASILIC VEIN TRANSPOSITION;  Surgeon: [name], MD;  Location: [place];  Service: Vascular;  Laterality: Left;  . BREAST BIOPSY Left   . BREAST LUMPECTOMY Left   . CARDIAC CATHETERIZATION N/A 04/10/2015   Procedure: Left Heart Cath and Coronary Angiography;  Surgeon: [name], MD;  Location: [place] CV [facility];  Service: Cardiovascular;  Laterality: N/A;  . CATARACT EXTRACTION Bilateral 1980  . DEXA  11/2015   DEXA T -4.3 [facility]  . ESOPHAGOGASTRODUODENOSCOPY N/A 04/09/2014   Procedure: ESOPHAGOGASTRODUODENOSCOPY (EGD);  Surgeon: [name], MD;  Location: [place];  Service: Endoscopy;  Laterality: N/A;  . ESOPHAGOGASTRODUODENOSCOPY N/A 09/23/2014   Procedure: ESOPHAGOGASTRODUODENOSCOPY (EGD);  Surgeon: [name], MD;  Location: [place];  Service: Endoscopy;  Laterality: N/A;  . ESOPHAGOGASTRODUODENOSCOPY N/A 12/26/2014   neg H pylori, benign biopsies; ESOPHAGOGASTRODUODENOSCOPY (EGD);  Surgeon: [name], MD  . ESOPHAGOGASTRODUODENOSCOPY N/A 06/15/2015   Procedure: ESOPHAGOGASTRODUODENOSCOPY (EGD);  Surgeon: Milus Banister, MD;  Location: [place];  Service: Endoscopy;  Laterality: N/A;  .  ESOPHAGOGASTRODUODENOSCOPY N/A 06/27/2015   Procedure: ESOPHAGOGASTRODUODENOSCOPY (EGD);  Surgeon: Mauri Pole, MD;  Location: [facility];  Service: Endoscopy;  Laterality: N/A;  . ESOPHAGOGASTRODUODENOSCOPY (EGD) WITH PROPOFOL N/A 11/12/2014   Procedure: ESOPHAGOGASTRODUODENOSCOPY (EGD) WITH PROPOFOL;  Surgeon: [name], MD;  Location: [facility];  Service: Endoscopy;  Laterality: N/A;  . ESOPHAGOGASTRODUODENOSCOPY (EGD) WITH PROPOFOL N/A 07/31/2015   Procedure: ESOPHAGOGASTRODUODENOSCOPY (EGD) WITH PROPOFOL;  Surgeon: Mauri Pole, MD;  Location: [facility];  Service: Endoscopy;  Laterality: N/A;  . ESOPHAGOGASTRODUODENOSCOPY (EGD) WITH PROPOFOL N/A 03/08/2016   Procedure: ESOPHAGOGASTRODUODENOSCOPY (EGD) WITH PROPOFOL;  Surgeon: Mauri Pole, MD;  Location: [facility];  Service: Endoscopy;  Laterality: N/A;  . EYE  SURGERY  1966   regular cataract  . HERNIA REPAIR    . HOT HEMOSTASIS N/A 11/12/2014   Procedure: HOT HEMOSTASIS (ARGON PLASMA COAGULATION/BICAP);  Surgeon: [name], MD;  Location[facility];  Service: Endoscopy;  Laterality: N/A;  . HOT HEMOSTASIS N/A 06/27/2015   Procedure: HOT HEMOSTASIS (ARGON PLASMA COAGULATION/BICAP);  Surgeon: Mauri Pole, MD;  Location: [facility];  Service: Endoscopy;  Laterality: N/A;  . HOT HEMOSTASIS N/A 07/31/2015   Procedure: HOT HEMOSTASIS (ARGON PLASMA COAGULATION/BICAP);  Surgeon: Mauri Pole, MD;  Location: [facility];  Service: Endoscopy;  Laterality: N/A;  . PERIPHERAL VASCULAR CATHETERIZATION N/A 08/15/2014   Procedure: A/V Shuntogram/Fistulagram;  Surgeon: [name], MD;  Location: [place] [facility];  Service: Cardiovascular;  Laterality: N/A;  . PERIPHERAL VASCULAR CATHETERIZATION N/A 04/28/2015   Procedure: A/V Shuntogram/Fistulagram;  Surgeon: [name], MD;  Location: [place] [facility];  Service: Cardiovascular;  Laterality: N/A;  . PERIPHERAL VASCULAR CATHETERIZATION Left  04/28/2015   Procedure: A/V Shunt Intervention;  Surgeon: [name], MD;  Location: [place] [facility];  Service: Cardiovascular;  Laterality: Left;  . PERIPHERAL VASCULAR CATHETERIZATION Left 08/07/2015   Procedure: A/V Shuntogram/Fistulagram;  Surgeon: [name], MD;  Location: [place] [facility];  Service: Cardiovascular;  Laterality: Left;  . PERIPHERAL VASCULAR CATHETERIZATION N/A 08/07/2015   Procedure: A/V Shunt Intervention;  Surgeon: [name], MD;  Location: [place] [facility];  Service: Cardiovascular;  Laterality: N/A;  . PERIPHERAL VASCULAR CATHETERIZATION Left 11/03/2015   Procedure: A/V Shuntogram/Fistulagram;  Surgeon: [name], MD;  Location: [place] [facility];  Service: Cardiovascular;  Laterality: Left;  . PERIPHERAL VASCULAR CATHETERIZATION N/A 11/03/2015   Procedure: A/V Shunt Intervention;  Surgeon: [name], MD;  Location: [place] [facility];  Service: Cardiovascular;  Laterality: N/A;  . PERIPHERAL VASCULAR CATHETERIZATION N/A 03/19/2016   Procedure: A/V Fistulagram;  Surgeon: Katha Cabal, MD;  Location: [place] [facility];  Service: Cardiovascular;  Laterality: N/A;  . PERIPHERAL VASCULAR CATHETERIZATION N/A 03/30/2016   Procedure: A/V Fistulagram;  Surgeon: Katha Cabal, MD;  Location: [place] [facility];  Service: Cardiovascular;  Laterality: N/A;  . PERIPHERAL VASCULAR CATHETERIZATION N/A 03/30/2016   Procedure: A/V Shunt Intervention;  Surgeon: Katha Cabal, MD;  Location: [place] [facility];  Service: Cardiovascular;  Laterality: N/A;  . SHUNTOGRAM N/A 04/16/2011   Procedure: [facility];  Surgeon: [name], MD;  Location: [facility] C[facility] LAB;  Service: Cardiovascular;  Laterality: N/A;  . SHUNTOGRAM Right 07/05/2013   Procedure: FISTULOGRAM;  Surgeon: [name], MD;  Location: [facility] CATH [facility];  Service: Cardiovascular;  Laterality: Right;  . UMBILICAL HERNIA REPAIR  2011  . US ECHOCARDIOGRAPHY  06/2012   LVH, EF 65%, grade 1 diastol  dysfunction, mod dliated LAD   Family History  Problem Relation Age of Onset  . Cancer Father     stomach  . CAD Father     MI at age 70  . Heart disease Father   . Heart attack Father   . Hypertension Mother   . Heart disease Brother     Congenital  . Hypertension Brother   . Diabetes Brother   . Cancer Sister     female (uterus?)   Allergies  Allergen Reactions  . Sulfa Antibiotics Rash and Other (See Comments)    fever      Assessment & Plan:  Patient presents for her first post-procedure follow up.  She is s/p a shuntogram on 03/30/16 for a poorly functioning HeRO graft. Patient presents today with improvement in her graft s/p the intervention. The patient underwent a duplex ultrasound of the AV access which was notable for a patent graft without any significant hemodynamic stenosis. The patient denies any issues with hemodialysis such as cannulation problems, increased bleeding, decrease in doppler flow or recirculation. The patient also denies any graft skin breakdown, pain, edema, pallor or ulceration of the arm / hand.  1. ESRD on dialysis [facility]) - Improvement Studies reviewed with patient. The patient is doing well and currently has adequate dialysis access. Duplex ultrasound of the AV access shows a patent access with no evidence of hemodynamically significant strictures or stenosis.  The patient should continue to have duplex ultrasounds of the dialysis access every six months. The patient was instructed to call the office in the interim if any issues with dialysis access / doppler flow, pain, edema, pallor, fistula skin breakdown or ulceration of the arm / hand occur. The patient expressed their understanding.  - VAS US DUPLEX DIALYSIS ACCESS (AVF,AVG); Future  2. History of diabetes mellitus, type II - Stable Encouraged good control as its slows the progression of atherosclerotic disease  3. Hyperlipidemia, unspecified hyperlipidemia type - Stable Encouraged good  control as its slows the progression of atherosclerotic disease  Current Outpatient Prescriptions on File Prior to Visit  Medication Sig Dispense Refill  . acetaminophen (TYLENOL) 500 MG tablet Take 1,000 mg by mouth daily.     Marland Kitchen amLODipine (NORVASC) 2.5 MG tablet Take 2.5 mg by mouth at bedtime.    Marland Kitchen aspirin EC 81 MG tablet Take 1 tablet (81 mg total) by mouth daily. 90 tablet 3  . atorvastatin (LIPITOR) 80 MG tablet Take 80 mg by mouth at bedtime.    . B Complex-C-Folic Acid (RENA-VITE PO) Take 1 tablet by mouth daily with breakfast.     . Calcium Carbonate Antacid (TUMS ULTRA 1000 PO) Take 3,000 mg by mouth 3 (three) times daily after meals.     . cholecalciferol (VITAMIN D) 1000 UNITS tablet Take 1,000 Units by mouth every Monday, Wednesday, and Friday.     . metoprolol tartrate (LOPRESSOR) 25 MG tablet Take 0.5 tablets (12.5 mg total) by mouth 2 (two) times daily. 60 tablet 11  . pantoprazole (PROTONIX) 40 MG tablet TAKE 1 TABLET BY MOUTH TWICE A DAY 60 tablet 5  . Polyethyl Glycol-Propyl Glycol (SYSTANE) 0.4-0.3 % GEL ophthalmic gel Place 1 application into the right eye every 6 (six) hours as needed (eye dryness or pain).    . sucralfate (CARAFATE) 1 g tablet Take 1 tablet (1 g total) by mouth 4 (four) times daily -  with meals and at bedtime. 60 tablet 0  . traMADol (ULTRAM) 50 MG tablet Take 0.5 tablets (25 mg total) by mouth 2 (two) times daily as needed for moderate pain. 20 tablet 0   No current facility-administered medications on file prior to visit.     There are no Patient Instructions on file for this visit. No Follow-up on file.   Libbi Towner A Shanoah Asbill, PA-C

## 2016-04-27 ENCOUNTER — Encounter: Payer: Self-pay | Admitting: Family Medicine

## 2016-04-27 ENCOUNTER — Ambulatory Visit (INDEPENDENT_AMBULATORY_CARE_PROVIDER_SITE_OTHER): Payer: Medicare Other | Admitting: Family Medicine

## 2016-04-27 VITALS — BP 158/60 | HR 68 | Temp 97.5°F | Wt 115.5 lb

## 2016-04-27 DIAGNOSIS — S20212A Contusion of left front wall of thorax, initial encounter: Secondary | ICD-10-CM

## 2016-04-27 DIAGNOSIS — R2689 Other abnormalities of gait and mobility: Secondary | ICD-10-CM

## 2016-04-27 NOTE — Assessment & Plan Note (Signed)
Handicap placard filled out - unable to walk >233ft without rest

## 2016-04-27 NOTE — Progress Notes (Signed)
BP (!) 158/60   Pulse 68   Temp 97.5 F (36.4 C) (Oral)   Wt 115 lb 8 oz (52.4 kg)   SpO2 97%   BMI 24.99 kg/m    CC: chest pain after fall Subjective:    Patient ID: [name], female    DOB: Feb 27, 1932, 83 y.o.   MRN: [id number]  HPI: [name] is a 83 y.o. female presenting on 04/27/2016 for Chest Pain (S/P fall 1 week ago, worse with deep breath or certain position change)   1 wk ago while hanging pictures she fell backwards and landed on bottom, on linoleum at kitchen. Felt ok. The next day she noticed left lower ribcage pain. A certain point on ribcage has reproducible tenderness. Pain with deep breath or with transfers out of bed or when laying on left side.   So far has tried tylenol with some relief.  No fevers, cough.   Requests handicap placard. Short winded with walking <252ft.   Relevant past medical, surgical, family and social history reviewed and updated as indicated. Interim medical history since our last visit reviewed. Allergies and medications reviewed and updated. Current Outpatient Prescriptions on File Prior to Visit  Medication Sig  . acetaminophen (TYLENOL) 500 MG tablet Take 1,000 mg by mouth daily.   Marland Kitchen amLODipine (NORVASC) 2.5 MG tablet Take 2.5 mg by mouth at bedtime.  Marland Kitchen aspirin EC 81 MG tablet Take 1 tablet (81 mg total) by mouth daily.  Marland Kitchen atorvastatin (LIPITOR) 80 MG tablet Take 80 mg by mouth at bedtime.  . B Complex-C-Folic Acid (RENA-VITE PO) Take 1 tablet by mouth daily with breakfast.   . Calcium Carbonate Antacid (TUMS ULTRA 1000 PO) Take 3,000 mg by mouth 3 (three) times daily after meals.   . cholecalciferol (VITAMIN D) 1000 UNITS tablet Take 1,000 Units by mouth every Monday, Wednesday, and Friday.   . metoprolol tartrate (LOPRESSOR) 25 MG tablet Take 0.5 tablets (12.5 mg total) by mouth 2 (two) times daily.  . pantoprazole (PROTONIX) 40 MG tablet TAKE 1 TABLET BY MOUTH TWICE A DAY  . Polyethyl Glycol-Propyl Glycol (SYSTANE)  0.4-0.3 % GEL ophthalmic gel Place 1 application into the right eye every 6 (six) hours as needed (eye dryness or pain).  . traMADol (ULTRAM) 50 MG tablet Take 0.5 tablets (25 mg total) by mouth 2 (two) times daily as needed for moderate pain.   No current facility-administered medications on file prior to visit.     Review of Systems Per HPI unless specifically indicated in ROS section     Objective:    BP (!) 158/60   Pulse 68   Temp 97.5 F (36.4 C) (Oral)   Wt 115 lb 8 oz (52.4 kg)   SpO2 97%   BMI 24.99 kg/m   Wt Readings from Last 3 Encounters:  04/27/16 115 lb 8 oz (52.4 kg)  04/20/16 114 lb (51.7 kg)  03/30/16 113 lb (51.3 kg)    Physical Exam  Constitutional: She appears well-developed and well-nourished. No distress.  HENT:  Mouth/Throat: Oropharynx is clear and moist. No oropharyngeal exudate.  Cardiovascular:  Murmur heard. Pulmonary/Chest: Effort normal and breath sounds normal. No respiratory distress. She has no wheezes. She has no rales. She exhibits tenderness.  Mild tenderness to palpation L anterior lower ribcage   Musculoskeletal: She exhibits no edema.  Skin: Skin is warm and dry. No rash noted.  Nursing note and vitals reviewed.     Assessment & Plan:  Handicap  placard provided today - unable to walk >200 ft without fatigue.  Problem List Items Addressed This Visit    Imbalance    Handicap placard filled out - unable to walk >279ft without rest      Rib contusion, left, initial encounter - Primary    Benign exam. Anticipate bony bruise.  Supportive care reviewed - tylenol and heating pad.           Follow up plan: Return if symptoms worsen or fail to improve.  [name], MD

## 2016-04-27 NOTE — Patient Instructions (Signed)
I think you had bony bruise of left lower ribcage. Should improve with time. Continue tylenol and may use heating pad.

## 2016-04-27 NOTE — Assessment & Plan Note (Signed)
Benign exam. Anticipate bony bruise.  Supportive care reviewed - tylenol and heating pad.

## 2016-04-28 DIAGNOSIS — N186 End stage renal disease: Secondary | ICD-10-CM | POA: Diagnosis not present

## 2016-04-28 DIAGNOSIS — Z992 Dependence on renal dialysis: Secondary | ICD-10-CM | POA: Diagnosis not present

## 2016-04-28 DIAGNOSIS — E1129 Type 2 diabetes mellitus with other diabetic kidney complication: Secondary | ICD-10-CM | POA: Diagnosis not present

## 2016-04-29 ENCOUNTER — Encounter (INDEPENDENT_AMBULATORY_CARE_PROVIDER_SITE_OTHER): Payer: Self-pay

## 2016-04-29 ENCOUNTER — Encounter (INDEPENDENT_AMBULATORY_CARE_PROVIDER_SITE_OTHER): Payer: Self-pay | Admitting: Vascular Surgery

## 2016-04-30 DIAGNOSIS — N2581 Secondary hyperparathyroidism of renal origin: Secondary | ICD-10-CM | POA: Diagnosis not present

## 2016-04-30 DIAGNOSIS — D631 Anemia in chronic kidney disease: Secondary | ICD-10-CM | POA: Diagnosis not present

## 2016-04-30 DIAGNOSIS — E1129 Type 2 diabetes mellitus with other diabetic kidney complication: Secondary | ICD-10-CM | POA: Diagnosis not present

## 2016-04-30 DIAGNOSIS — E782 Mixed hyperlipidemia: Secondary | ICD-10-CM | POA: Diagnosis not present

## 2016-04-30 DIAGNOSIS — N186 End stage renal disease: Secondary | ICD-10-CM | POA: Diagnosis not present

## 2016-05-03 DIAGNOSIS — E1129 Type 2 diabetes mellitus with other diabetic kidney complication: Secondary | ICD-10-CM | POA: Diagnosis not present

## 2016-05-03 DIAGNOSIS — N186 End stage renal disease: Secondary | ICD-10-CM | POA: Diagnosis not present

## 2016-05-03 DIAGNOSIS — N2581 Secondary hyperparathyroidism of renal origin: Secondary | ICD-10-CM | POA: Diagnosis not present

## 2016-05-03 DIAGNOSIS — D631 Anemia in chronic kidney disease: Secondary | ICD-10-CM | POA: Diagnosis not present

## 2016-05-03 DIAGNOSIS — E782 Mixed hyperlipidemia: Secondary | ICD-10-CM | POA: Diagnosis not present

## 2016-05-05 DIAGNOSIS — D631 Anemia in chronic kidney disease: Secondary | ICD-10-CM | POA: Diagnosis not present

## 2016-05-05 DIAGNOSIS — N186 End stage renal disease: Secondary | ICD-10-CM | POA: Diagnosis not present

## 2016-05-05 DIAGNOSIS — E782 Mixed hyperlipidemia: Secondary | ICD-10-CM | POA: Diagnosis not present

## 2016-05-05 DIAGNOSIS — E1129 Type 2 diabetes mellitus with other diabetic kidney complication: Secondary | ICD-10-CM | POA: Diagnosis not present

## 2016-05-05 DIAGNOSIS — N2581 Secondary hyperparathyroidism of renal origin: Secondary | ICD-10-CM | POA: Diagnosis not present

## 2016-05-07 DIAGNOSIS — E782 Mixed hyperlipidemia: Secondary | ICD-10-CM | POA: Diagnosis not present

## 2016-05-07 DIAGNOSIS — E1129 Type 2 diabetes mellitus with other diabetic kidney complication: Secondary | ICD-10-CM | POA: Diagnosis not present

## 2016-05-07 DIAGNOSIS — D631 Anemia in chronic kidney disease: Secondary | ICD-10-CM | POA: Diagnosis not present

## 2016-05-07 DIAGNOSIS — N186 End stage renal disease: Secondary | ICD-10-CM | POA: Diagnosis not present

## 2016-05-07 DIAGNOSIS — N2581 Secondary hyperparathyroidism of renal origin: Secondary | ICD-10-CM | POA: Diagnosis not present

## 2016-05-10 DIAGNOSIS — E1129 Type 2 diabetes mellitus with other diabetic kidney complication: Secondary | ICD-10-CM | POA: Diagnosis not present

## 2016-05-10 DIAGNOSIS — E782 Mixed hyperlipidemia: Secondary | ICD-10-CM | POA: Diagnosis not present

## 2016-05-10 DIAGNOSIS — D631 Anemia in chronic kidney disease: Secondary | ICD-10-CM | POA: Diagnosis not present

## 2016-05-10 DIAGNOSIS — N2581 Secondary hyperparathyroidism of renal origin: Secondary | ICD-10-CM | POA: Diagnosis not present

## 2016-05-10 DIAGNOSIS — N186 End stage renal disease: Secondary | ICD-10-CM | POA: Diagnosis not present

## 2016-05-12 DIAGNOSIS — E1129 Type 2 diabetes mellitus with other diabetic kidney complication: Secondary | ICD-10-CM | POA: Diagnosis not present

## 2016-05-12 DIAGNOSIS — E782 Mixed hyperlipidemia: Secondary | ICD-10-CM | POA: Diagnosis not present

## 2016-05-12 DIAGNOSIS — N2581 Secondary hyperparathyroidism of renal origin: Secondary | ICD-10-CM | POA: Diagnosis not present

## 2016-05-12 DIAGNOSIS — N186 End stage renal disease: Secondary | ICD-10-CM | POA: Diagnosis not present

## 2016-05-12 DIAGNOSIS — D631 Anemia in chronic kidney disease: Secondary | ICD-10-CM | POA: Diagnosis not present

## 2016-05-13 ENCOUNTER — Other Ambulatory Visit: Payer: Self-pay

## 2016-05-13 MED ORDER — ATORVASTATIN CALCIUM 80 MG PO TABS: 80.0000 mg | ORAL_TABLET | Freq: Every day | ORAL | 0 refills | Status: DC

## 2016-05-14 DIAGNOSIS — N2581 Secondary hyperparathyroidism of renal origin: Secondary | ICD-10-CM | POA: Diagnosis not present

## 2016-05-14 DIAGNOSIS — E1129 Type 2 diabetes mellitus with other diabetic kidney complication: Secondary | ICD-10-CM | POA: Diagnosis not present

## 2016-05-14 DIAGNOSIS — E782 Mixed hyperlipidemia: Secondary | ICD-10-CM | POA: Diagnosis not present

## 2016-05-14 DIAGNOSIS — N186 End stage renal disease: Secondary | ICD-10-CM | POA: Diagnosis not present

## 2016-05-14 DIAGNOSIS — D631 Anemia in chronic kidney disease: Secondary | ICD-10-CM | POA: Diagnosis not present

## 2016-05-17 DIAGNOSIS — D631 Anemia in chronic kidney disease: Secondary | ICD-10-CM | POA: Diagnosis not present

## 2016-05-17 DIAGNOSIS — N186 End stage renal disease: Secondary | ICD-10-CM | POA: Diagnosis not present

## 2016-05-17 DIAGNOSIS — N2581 Secondary hyperparathyroidism of renal origin: Secondary | ICD-10-CM | POA: Diagnosis not present

## 2016-05-17 DIAGNOSIS — E1129 Type 2 diabetes mellitus with other diabetic kidney complication: Secondary | ICD-10-CM | POA: Diagnosis not present

## 2016-05-17 DIAGNOSIS — E782 Mixed hyperlipidemia: Secondary | ICD-10-CM | POA: Diagnosis not present

## 2016-05-19 DIAGNOSIS — N186 End stage renal disease: Secondary | ICD-10-CM | POA: Diagnosis not present

## 2016-05-19 DIAGNOSIS — D631 Anemia in chronic kidney disease: Secondary | ICD-10-CM | POA: Diagnosis not present

## 2016-05-19 DIAGNOSIS — N2581 Secondary hyperparathyroidism of renal origin: Secondary | ICD-10-CM | POA: Diagnosis not present

## 2016-05-19 DIAGNOSIS — E782 Mixed hyperlipidemia: Secondary | ICD-10-CM | POA: Diagnosis not present

## 2016-05-19 DIAGNOSIS — E1129 Type 2 diabetes mellitus with other diabetic kidney complication: Secondary | ICD-10-CM | POA: Diagnosis not present

## 2016-05-21 DIAGNOSIS — D631 Anemia in chronic kidney disease: Secondary | ICD-10-CM | POA: Diagnosis not present

## 2016-05-21 DIAGNOSIS — N2581 Secondary hyperparathyroidism of renal origin: Secondary | ICD-10-CM | POA: Diagnosis not present

## 2016-05-21 DIAGNOSIS — N186 End stage renal disease: Secondary | ICD-10-CM | POA: Diagnosis not present

## 2016-05-21 DIAGNOSIS — E782 Mixed hyperlipidemia: Secondary | ICD-10-CM | POA: Diagnosis not present

## 2016-05-21 DIAGNOSIS — E1129 Type 2 diabetes mellitus with other diabetic kidney complication: Secondary | ICD-10-CM | POA: Diagnosis not present

## 2016-05-24 ENCOUNTER — Ambulatory Visit: Payer: Medicare Other | Admitting: Gastroenterology

## 2016-05-24 DIAGNOSIS — E1129 Type 2 diabetes mellitus with other diabetic kidney complication: Secondary | ICD-10-CM | POA: Diagnosis not present

## 2016-05-24 DIAGNOSIS — D631 Anemia in chronic kidney disease: Secondary | ICD-10-CM | POA: Diagnosis not present

## 2016-05-24 DIAGNOSIS — E782 Mixed hyperlipidemia: Secondary | ICD-10-CM | POA: Diagnosis not present

## 2016-05-24 DIAGNOSIS — N2581 Secondary hyperparathyroidism of renal origin: Secondary | ICD-10-CM | POA: Diagnosis not present

## 2016-05-24 DIAGNOSIS — N186 End stage renal disease: Secondary | ICD-10-CM | POA: Diagnosis not present

## 2016-05-26 DIAGNOSIS — Z992 Dependence on renal dialysis: Secondary | ICD-10-CM | POA: Diagnosis not present

## 2016-05-26 DIAGNOSIS — E1129 Type 2 diabetes mellitus with other diabetic kidney complication: Secondary | ICD-10-CM | POA: Diagnosis not present

## 2016-05-26 DIAGNOSIS — N2581 Secondary hyperparathyroidism of renal origin: Secondary | ICD-10-CM | POA: Diagnosis not present

## 2016-05-26 DIAGNOSIS — D631 Anemia in chronic kidney disease: Secondary | ICD-10-CM | POA: Diagnosis not present

## 2016-05-26 DIAGNOSIS — N186 End stage renal disease: Secondary | ICD-10-CM | POA: Diagnosis not present

## 2016-05-26 DIAGNOSIS — E782 Mixed hyperlipidemia: Secondary | ICD-10-CM | POA: Diagnosis not present

## 2016-05-28 DIAGNOSIS — E1129 Type 2 diabetes mellitus with other diabetic kidney complication: Secondary | ICD-10-CM | POA: Diagnosis not present

## 2016-05-28 DIAGNOSIS — D631 Anemia in chronic kidney disease: Secondary | ICD-10-CM | POA: Diagnosis not present

## 2016-05-28 DIAGNOSIS — E782 Mixed hyperlipidemia: Secondary | ICD-10-CM | POA: Diagnosis not present

## 2016-05-28 DIAGNOSIS — N2581 Secondary hyperparathyroidism of renal origin: Secondary | ICD-10-CM | POA: Diagnosis not present

## 2016-05-28 DIAGNOSIS — N186 End stage renal disease: Secondary | ICD-10-CM | POA: Diagnosis not present

## 2016-05-31 DIAGNOSIS — E1129 Type 2 diabetes mellitus with other diabetic kidney complication: Secondary | ICD-10-CM | POA: Diagnosis not present

## 2016-05-31 DIAGNOSIS — N186 End stage renal disease: Secondary | ICD-10-CM | POA: Diagnosis not present

## 2016-05-31 DIAGNOSIS — N2581 Secondary hyperparathyroidism of renal origin: Secondary | ICD-10-CM | POA: Diagnosis not present

## 2016-05-31 DIAGNOSIS — D631 Anemia in chronic kidney disease: Secondary | ICD-10-CM | POA: Diagnosis not present

## 2016-05-31 DIAGNOSIS — E782 Mixed hyperlipidemia: Secondary | ICD-10-CM | POA: Diagnosis not present

## 2016-06-01 ENCOUNTER — Ambulatory Visit (INDEPENDENT_AMBULATORY_CARE_PROVIDER_SITE_OTHER): Payer: Medicare Other | Admitting: Cardiology

## 2016-06-01 ENCOUNTER — Encounter: Payer: Self-pay | Admitting: Cardiology

## 2016-06-01 VITALS — BP 148/74 | HR 79 | Ht <= 58 in | Wt 114.0 lb

## 2016-06-01 DIAGNOSIS — I2510 Atherosclerotic heart disease of native coronary artery without angina pectoris: Secondary | ICD-10-CM | POA: Diagnosis not present

## 2016-06-01 DIAGNOSIS — E7800 Pure hypercholesterolemia, unspecified: Secondary | ICD-10-CM | POA: Diagnosis not present

## 2016-06-01 DIAGNOSIS — I10 Essential (primary) hypertension: Secondary | ICD-10-CM | POA: Diagnosis not present

## 2016-06-01 MED ORDER — METOPROLOL TARTRATE 25 MG PO TABS: 25.0000 mg | ORAL_TABLET | Freq: Two times a day (BID) | ORAL | 3 refills | Status: AC

## 2016-06-01 MED ORDER — AMLODIPINE BESYLATE 5 MG PO TABS: 5.0000 mg | ORAL_TABLET | Freq: Every day | ORAL | 3 refills | Status: DC

## 2016-06-01 MED ORDER — ATORVASTATIN CALCIUM 80 MG PO TABS: 80.0000 mg | ORAL_TABLET | Freq: Every day | ORAL | 3 refills | Status: AC

## 2016-06-01 NOTE — Progress Notes (Signed)
HPI: FU bradycardia and CAD. She is dialysis dependent. Holter monitor in April of 2014 showed sinus rhythm to sinus bradycardia with PACs and PVCs. Nuclear study repeated September 2016. Ejection fraction 47%. There was an anterior lateral perfusion defect consistent with prior infarct and peri-infarct ischemia. Cardiac catheterization January 2017 showed a 95% distal right coronary lesion, 70% proximal to mid, 85% acute marginal, 99% ramus, 80% circumflex, 85% first marginal, 75% second diagonal and 80% mid LAD. PCI was felt to be high risk. Dr Roxan Hockey saw pt and felt she would be very high risk for surgery; she elected medical therapy. Carotid Dopplers February 2017 showed 1-39% bilateral stenosis. Echocardiogram March 2017 showed normal LV function, Moderate mitral regurgitation, mild to moderate left atrial enlargement. Patient was transfused with improvement in hemoglobin. Since last seen she notes some increased dyspnea on exertion. No orthopnea or PND. No pedal edema, chest pain or syncope. She does state that her blood pressure is running high even on dialysis days.  Current Outpatient Prescriptions  Medication Sig Dispense Refill  . acetaminophen (TYLENOL) 500 MG tablet Take 1,000 mg by mouth daily.     Marland Kitchen amLODipine (NORVASC) 2.5 MG tablet Take 2.5 mg by mouth at bedtime.    Marland Kitchen aspirin EC 81 MG tablet Take 1 tablet (81 mg total) by mouth daily. 90 tablet 3  . atorvastatin (LIPITOR) 80 MG tablet Take 1 tablet (80 mg total) by mouth at bedtime. PLEASE MAKE APPOINTMENT FOR FURTHER REFILLS , 1st attempt 90 tablet 0  . B Complex-C-Folic Acid (RENA-VITE PO) Take 1 tablet by mouth daily with breakfast.     . Calcium Carbonate Antacid (TUMS ULTRA 1000 PO) Take 3,000 mg by mouth 3 (three) times daily after meals.     . cholecalciferol (VITAMIN D) 1000 UNITS tablet Take 1,000 Units by mouth every Monday, Wednesday, and Friday.     . metoprolol tartrate (LOPRESSOR) 25 MG tablet Take 0.5  tablets (12.5 mg total) by mouth 2 (two) times daily. 60 tablet 11  . pantoprazole (PROTONIX) 40 MG tablet TAKE 1 TABLET BY MOUTH TWICE A DAY 60 tablet 5  . Polyethyl Glycol-Propyl Glycol (SYSTANE) 0.4-0.3 % GEL ophthalmic gel Place 1 application into the right eye every 6 (six) hours as needed (eye dryness or pain).    . traMADol (ULTRAM) 50 MG tablet Take 0.5 tablets (25 mg total) by mouth 2 (two) times daily as needed for moderate pain. 20 tablet 0   No current facility-administered medications for this visit.      Past Medical History:  Diagnosis Date  . Anemia in chronic kidney disease    IV iron infusion and anemia from GAVE blood loss.  . Arthritis     hands  . Bradycardia 2014   a. Noted 06/2012.  . Breast cancer ([place])    Left Breast 13 yrs. ago  . CAD (coronary artery disease), native coronary artery    3 vessel CAD by cath 04/2015  . Cardiac murmur    a. thought due to AVF (2D echo 06/2012 without significant valvular disease).   . Ectropion of right lower eyelid 11/2012   s/p surgery (Dr. [name])  . End stage renal disease on dialysis [facility])    HD M,W,F [place] AVF now LUE AVF  . Facial droop 1935   acquired during forceps delivery, some residual R visual loss  . Family history of adverse reaction to anesthesia    brother trouble waking up and  confusion, now deceased  . GAVE (gastric antral vascular ectasia) 08/2014   s/p EGD with APC  . GERD (gastroesophageal reflux disease)   . Hearing loss    Bilateral hearing aids  . History of blood transfusion 5 weeks ago  . Hx of breast cancer 2004  . Hyperlipidemia   . Hypertension   . Hypotension    a. Associated w/ dialysis.  . Osteoporosis 11/2013   DEXA -3.4 radius  . Sight impaired    Left: wears contact. Right: diminished peripheral vision    Past Surgical History:  Procedure Laterality Date  . AV FISTULA PLACEMENT  07/20/10   Right brachiocephalic AVF  . BASCILIC VEIN TRANSPOSITION Left  07/24/2013   Procedure: BASILIC VEIN TRANSPOSITION;  Surgeon: [name], MD;  Location: [place];  Service: Vascular;  Laterality: Left;  . BREAST BIOPSY Left   . BREAST LUMPECTOMY Left   . CARDIAC CATHETERIZATION N/A 04/10/2015   Procedure: Left Heart Cath and Coronary Angiography;  Surgeon: [name], MD;  Location: [place] CV LAB;  Service: Cardiovascular;  Laterality: N/A;  . CATARACT EXTRACTION Bilateral 1980  . DEXA  11/2015   DEXA T -4.3 [facility]  . ESOPHAGOGASTRODUODENOSCOPY N/A 04/09/2014   Procedure: ESOPHAGOGASTRODUODENOSCOPY (EGD);  Surgeon: [name], MD;  Location: [place];  Service: Endoscopy;  Laterality: N/A;  . ESOPHAGOGASTRODUODENOSCOPY N/A 09/23/2014   Procedure: ESOPHAGOGASTRODUODENOSCOPY (EGD);  Surgeon: [name], MD;  Location: [place];  Service: Endoscopy;  Laterality: N/A;  . ESOPHAGOGASTRODUODENOSCOPY N/A 12/26/2014   neg H pylori, benign biopsies; ESOPHAGOGASTRODUODENOSCOPY (EGD);  Surgeon: [name], MD  . ESOPHAGOGASTRODUODENOSCOPY N/A 06/15/2015   Procedure: ESOPHAGOGASTRODUODENOSCOPY (EGD);  Surgeon: Milus Banister, MD;  Location: [place];  Service: Endoscopy;  Laterality: N/A;  . ESOPHAGOGASTRODUODENOSCOPY N/A 06/27/2015   Procedure: ESOPHAGOGASTRODUODENOSCOPY (EGD);  Surgeon: Mauri [place], MD;  Location: [facility];  Service: Endoscopy;  Laterality: N/A;  . ESOPHAGOGASTRODUODENOSCOPY (EGD) WITH PROPOFOL N/A 11/12/2014   Procedure: ESOPHAGOGASTRODUODENOSCOPY (EGD) WITH PROPOFOL;  Surgeon: [name], MD;  Location: [facility];  Service: Endoscopy;  Laterality: N/A;  . ESOPHAGOGASTRODUODENOSCOPY (EGD) WITH PROPOFOL N/A 07/31/2015   Procedure: ESOPHAGOGASTRODUODENOSCOPY (EGD) WITH PROPOFOL;  Surgeon: Mauri [place], MD;  Location: [facility];  Service: Endoscopy;  Laterality: N/A;  . ESOPHAGOGASTRODUODENOSCOPY (EGD) WITH PROPOFOL N/A 03/08/2016   Procedure: ESOPHAGOGASTRODUODENOSCOPY (EGD) WITH PROPOFOL;   Surgeon: Mauri [place], MD;  Location: [facility];  Service: Endoscopy;  Laterality: N/A;  . EYE SURGERY  1966   regular cataract  . HERNIA REPAIR    . HOT HEMOSTASIS N/A 11/12/2014   Procedure: HOT HEMOSTASIS (ARGON PLASMA COAGULATION/BICAP);  Surgeon: [name], MD;  Location: [facility];  Service: Endoscopy;  Laterality: N/A;  . HOT HEMOSTASIS N/A 06/27/2015   Procedure: HOT HEMOSTASIS (ARGON PLASMA COAGULATION/BICAP);  Surgeon: Mauri [place], MD;  Location: [facility];  Service: Endoscopy;  Laterality: N/A;  . HOT HEMOSTASIS N/A 07/31/2015   Procedure: HOT HEMOSTASIS (ARGON PLASMA COAGULATION/BICAP);  Surgeon: Mauri [place], MD;  Location: [facility];  Service: Endoscopy;  Laterality: N/A;  . PERIPHERAL VASCULAR CATHETERIZATION N/A 08/15/2014   Procedure: A/V Shuntogram/Fistulagram;  Surgeon: [name], MD;  Location: [place] [facility];  Service: Cardiovascular;  Laterality: N/A;  . PERIPHERAL VASCULAR CATHETERIZATION N/A 04/28/2015   Procedure: A/V Shuntogram/Fistulagram;  Surgeon: [name], MD;  Location: [place] [facility];  Service: Cardiovascular;  Laterality: N/A;  . PERIPHERAL VASCULAR CATHETERIZATION Left 04/28/2015   Procedure: A/V  Shunt Intervention;  Surgeon: [name], MD;  Location: [place] [facility];  Service: Cardiovascular;  Laterality: Left;  . PERIPHERAL VASCULAR CATHETERIZATION Left 08/07/2015   Procedure: A/V Shuntogram/Fistulagram;  Surgeon: [name], MD;  Location: [place] [facility];  Service: Cardiovascular;  Laterality: Left;  . PERIPHERAL VASCULAR CATHETERIZATION N/A 08/07/2015   Procedure: A/V Shunt Intervention;  Surgeon: [name], MD;  Location: [place] [facility];  Service: Cardiovascular;  Laterality: N/A;  . PERIPHERAL VASCULAR CATHETERIZATION Left 11/03/2015   Procedure: A/V Shuntogram/Fistulagram;  Surgeon: [name], MD;  Location: [place] [facility];  Service: Cardiovascular;  Laterality: Left;  . PERIPHERAL  VASCULAR CATHETERIZATION N/A 11/03/2015   Procedure: A/V Shunt Intervention;  Surgeon: [name], MD;  Location: [place] [facility] LAB;  Service: Cardiovascular;  Laterality: N/A;  . PERIPHERAL VASCULAR CATHETERIZATION N/A 03/19/2016   Procedure: A/V Fistulagram;  Surgeon: Katha Cabal, MD;  Location: [place] [facility];  Service: Cardiovascular;  Laterality: N/A;  . PERIPHERAL VASCULAR CATHETERIZATION N/A 03/30/2016   Procedure: A/V Fistulagram;  Surgeon: Katha Cabal, MD;  Location: [place] CV LAB;  Service: Cardiovascular;  Laterality: N/A;  . PERIPHERAL VASCULAR CATHETERIZATION N/A 03/30/2016   Procedure: A/V Shunt Intervention;  Surgeon: Katha Cabal, MD;  Location: [place] [facility] LAB;  Service: Cardiovascular;  Laterality: N/A;  . SHUNTOGRAM N/A 04/16/2011   Procedure: [place];  Surgeon: [name], MD;  Location: [facility] CATH LAB;  Service: Cardiovascular;  Laterality: N/A;  . SHUNTOGRAM Right 07/05/2013   Procedure: FISTULOGRAM;  Surgeon: [name], MD;  Location: [facility] CATH LAB;  Service: Cardiovascular;  Laterality: Right;  . UMBILICAL HERNIA REPAIR  2011  . US ECHOCARDIOGRAPHY  06/2012   LVH, EF 65%, grade 1 diastol dysfunction, mod dliated LAD    Social History   Social History  . Marital status: Single    Spouse name: N/A  . Number of children: N/A  . Years of education: N/A   Occupational History  . 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      retired   Social History Main Topics  . Smoking status: Former Smoker    Years: 2.00    Types: Cigarettes    Quit date: 03/29/1953  . Smokeless tobacco: Never Used  . Alcohol use 0.0 oz/week     Comment: occasional  . Drug use: No  . Sexual activity: No   Other Topics Concern  . Not on file   Social History Narrative   As of 03/2013. Lives alone with her small dog.  Bother and sister in law died last year.     Occupation: retired, was Information systems manager for [employer]    Edu: some college    Family History  Problem Relation Age of Onset  . Cancer Father     stomach  . CAD Father     MI at age 18  . Heart disease Father   . Heart attack Father   . Hypertension Mother   . Heart disease Brother     Congenital  . Hypertension Brother   . Diabetes Brother   . Cancer Sister     female (uterus?)    ROS: no fevers or chills, productive cough, hemoptysis, dysphasia, odynophagia, melena, hematochezia, dysuria, hematuria, rash, seizure activity, orthopnea, PND, pedal edema, claudication. Remaining systems are negative.  Physical Exam: Well-developed well-nourished in no acute distress.  Skin is warm and dry.  HEENT with congenital  facial droop on the right Neck is supple.  Chest is clear to auscultation with normal expansion.  Cardiovascular exam is regular rate and rhythm.  Abdominal exam nontender or distended. No masses palpated. Extremities show no edema. neuro grossly intact  ECG-sinus rhythm at a rate of 79. Cannot rule out prior septal infarct. Nonspecific ST changes. personally reviewed  A/P  1 Coronary artery disease-patient is having some increased dyspnea. This certainly could be related to her coronary disease. She states her blood pressure has been running high as well. She is not a candidate for coronary artery bypass and graft or PCI. I will increase her metoprolol to 25 mg twice a day and her amlodipine 5 mg daily. We will see her back in 3 months to see if this has improved her symptoms.  2 hypertension-blood pressure is elevated. Increase metoprolol and amlodipine as described above. Follow blood pressure and adjust regimen as needed.  3 hyperlipidemia-continue statin.  4 anemia-hemoglobin is monitored by nephrology. Transfuse as needed. Patient states recent hemoglobin 11.  5 end-stage renal disease-management per nephrology. Anti-anginal medications are limited by low blood pressure on dialysis days.  Kirk Ruths,  MD

## 2016-06-01 NOTE — Patient Instructions (Signed)
Medication Instructions:   INCREASE METOPROLOL TO 25 MG TWICE DAILY= 1 TABLET TWICE DAILY  INCREASE AMLODIPINE TO 5 MG ONCE DAILY= 2 OF THE 25 MG TABLETS ONCE DAILY  Follow-Up:  Your physician recommends that you schedule a follow-up appointment in: [place]   If you need a refill on your cardiac medications before your next appointment, please call your pharmacy.

## 2016-06-02 DIAGNOSIS — N2581 Secondary hyperparathyroidism of renal origin: Secondary | ICD-10-CM | POA: Diagnosis not present

## 2016-06-02 DIAGNOSIS — N186 End stage renal disease: Secondary | ICD-10-CM | POA: Diagnosis not present

## 2016-06-02 DIAGNOSIS — E782 Mixed hyperlipidemia: Secondary | ICD-10-CM | POA: Diagnosis not present

## 2016-06-02 DIAGNOSIS — E1129 Type 2 diabetes mellitus with other diabetic kidney complication: Secondary | ICD-10-CM | POA: Diagnosis not present

## 2016-06-02 DIAGNOSIS — D631 Anemia in chronic kidney disease: Secondary | ICD-10-CM | POA: Diagnosis not present

## 2016-06-04 DIAGNOSIS — E1129 Type 2 diabetes mellitus with other diabetic kidney complication: Secondary | ICD-10-CM | POA: Diagnosis not present

## 2016-06-04 DIAGNOSIS — N186 End stage renal disease: Secondary | ICD-10-CM | POA: Diagnosis not present

## 2016-06-04 DIAGNOSIS — N2581 Secondary hyperparathyroidism of renal origin: Secondary | ICD-10-CM | POA: Diagnosis not present

## 2016-06-04 DIAGNOSIS — E782 Mixed hyperlipidemia: Secondary | ICD-10-CM | POA: Diagnosis not present

## 2016-06-04 DIAGNOSIS — D631 Anemia in chronic kidney disease: Secondary | ICD-10-CM | POA: Diagnosis not present

## 2016-06-07 DIAGNOSIS — E782 Mixed hyperlipidemia: Secondary | ICD-10-CM | POA: Diagnosis not present

## 2016-06-07 DIAGNOSIS — D631 Anemia in chronic kidney disease: Secondary | ICD-10-CM | POA: Diagnosis not present

## 2016-06-07 DIAGNOSIS — N186 End stage renal disease: Secondary | ICD-10-CM | POA: Diagnosis not present

## 2016-06-07 DIAGNOSIS — N2581 Secondary hyperparathyroidism of renal origin: Secondary | ICD-10-CM | POA: Diagnosis not present

## 2016-06-07 DIAGNOSIS — E1129 Type 2 diabetes mellitus with other diabetic kidney complication: Secondary | ICD-10-CM | POA: Diagnosis not present

## 2016-06-08 ENCOUNTER — Ambulatory Visit (INDEPENDENT_AMBULATORY_CARE_PROVIDER_SITE_OTHER): Payer: Self-pay | Admitting: Vascular Surgery

## 2016-06-08 ENCOUNTER — Encounter (INDEPENDENT_AMBULATORY_CARE_PROVIDER_SITE_OTHER): Payer: Self-pay

## 2016-06-09 DIAGNOSIS — E782 Mixed hyperlipidemia: Secondary | ICD-10-CM | POA: Diagnosis not present

## 2016-06-09 DIAGNOSIS — E1129 Type 2 diabetes mellitus with other diabetic kidney complication: Secondary | ICD-10-CM | POA: Diagnosis not present

## 2016-06-09 DIAGNOSIS — N186 End stage renal disease: Secondary | ICD-10-CM | POA: Diagnosis not present

## 2016-06-09 DIAGNOSIS — D631 Anemia in chronic kidney disease: Secondary | ICD-10-CM | POA: Diagnosis not present

## 2016-06-09 DIAGNOSIS — N2581 Secondary hyperparathyroidism of renal origin: Secondary | ICD-10-CM | POA: Diagnosis not present

## 2016-06-10 ENCOUNTER — Telehealth: Payer: Self-pay | Admitting: Cardiology

## 2016-06-10 NOTE — Telephone Encounter (Signed)
New message   Pt c/o medication issue:  1. Name of Medication: metoprolol tartrate (LOPRESSOR) 25 MG tablet  2. How are you currently taking this medication (dosage and times per day)? As Prescribed  3. Are you having a reaction (difficulty breathing--STAT)? No  4. What is your medication issue? Per pt medication was switched and it is causing diarrhea and making her tired.   Requesting call back from nurse

## 2016-06-10 NOTE — Telephone Encounter (Signed)
Spoke with pt, since increasing her medication last Friday she has developed diarrhea. She has had 2 episodes of dizziness as well. Her bp yesterday prior to dialysis was 119/61. She is also complaining of fatigue. Instructed the patient to decrease the metoprolol back to 1/2 tablet twice daily. She will monitor her symptoms and bp and let us know of any problems.

## 2016-06-11 DIAGNOSIS — N2581 Secondary hyperparathyroidism of renal origin: Secondary | ICD-10-CM | POA: Diagnosis not present

## 2016-06-11 DIAGNOSIS — N186 End stage renal disease: Secondary | ICD-10-CM | POA: Diagnosis not present

## 2016-06-11 DIAGNOSIS — E1129 Type 2 diabetes mellitus with other diabetic kidney complication: Secondary | ICD-10-CM | POA: Diagnosis not present

## 2016-06-11 DIAGNOSIS — D631 Anemia in chronic kidney disease: Secondary | ICD-10-CM | POA: Diagnosis not present

## 2016-06-11 DIAGNOSIS — E782 Mixed hyperlipidemia: Secondary | ICD-10-CM | POA: Diagnosis not present

## 2016-06-14 DIAGNOSIS — N186 End stage renal disease: Secondary | ICD-10-CM | POA: Diagnosis not present

## 2016-06-14 DIAGNOSIS — E782 Mixed hyperlipidemia: Secondary | ICD-10-CM | POA: Diagnosis not present

## 2016-06-14 DIAGNOSIS — D631 Anemia in chronic kidney disease: Secondary | ICD-10-CM | POA: Diagnosis not present

## 2016-06-14 DIAGNOSIS — N2581 Secondary hyperparathyroidism of renal origin: Secondary | ICD-10-CM | POA: Diagnosis not present

## 2016-06-14 DIAGNOSIS — E1129 Type 2 diabetes mellitus with other diabetic kidney complication: Secondary | ICD-10-CM | POA: Diagnosis not present

## 2016-06-15 ENCOUNTER — Telehealth: Payer: Self-pay | Admitting: Family Medicine

## 2016-06-15 ENCOUNTER — Emergency Department (HOSPITAL_COMMUNITY)
Admission: EM | Admit: 2016-06-15 | Discharge: 2016-06-15 | Disposition: A | Discharge status: Home / Self Care | Payer: Medicare Other

## 2016-06-15 NOTE — Telephone Encounter (Signed)
Spoke with patient. She had actually just arrived at the ER, but hadn't checked in yet. She said she had DOE worse when outside and after eating for a couple of weeks. She also tastes acid in her mouth. She is fine when she is inside and not moving around. I advised her that Dr. Darnell Level felt like this was something that could wait until tomorrow since it had been going on for awhile. She said she felt like she could wait until tomorrow as well. I advised her if she developed any chest pain, dizziness, weakness, blurry vision or swelling to return to ER. She verbalized understanding.

## 2016-06-15 NOTE — ED Triage Notes (Signed)
Call x 1 no answer

## 2016-06-15 NOTE — Telephone Encounter (Signed)
Noted. Will touch base with Debbie tomorrow. Thanks, Garlon Hatchet

## 2016-06-15 NOTE — Telephone Encounter (Signed)
Patient Name: [name]  DOB: 1931-05-24    Initial Comment Caller says she has SOB when she walks. Her cardiologist changed her med but it is not much better. No SOB now   Nurse Assessment  Nurse: Thad Ranger, RN, [place] Gauss Date/Time (Eastern Time): 06/15/2016 3:23:17 PM  Confirm and document reason for call. If symptomatic, describe symptoms. ---Caller says she has SOB when she walks. Her cardiologist changed her med but it is not much better. No SOB now. States the cardiologist changed her Metoprolol and Amlodipine last wk.  Does the patient have any new or worsening symptoms? ---Yes  Will a triage be completed? ---Yes  Related visit to physician within the last 2 weeks? ---Yes  Does the PT have any chronic conditions? (i.e. diabetes, asthma, etc.) ---Yes  List chronic conditions. ---CAD w/blockage, tx'ed w/meds instead of surgical revision; Kidney dx and on dialysis.  Is this a behavioral health or substance abuse call? ---No     Guidelines    Guideline Title Affirmed Question Affirmed Notes  Breathing Difficulty [1] MODERATE difficulty breathing (e.g., speaks in phrases, SOB even at rest, pulse 100-120) AND [2] NEW-onset or WORSE than normal    Final Disposition User   Go to ED Now Thad Ranger, RN, [facility] - ED   Disagree/Comply: Comply

## 2016-06-15 NOTE — Telephone Encounter (Addendum)
plz call and get more details.  I don't think she merits ER eval based on above.. Reviewed recent cards note - amlodipine increased to 5mg , metoprolol initially increased to 25mg  bid, but then decreased back to 12.5mg  bid due to fatigue yesterday.

## 2016-06-16 ENCOUNTER — Ambulatory Visit (INDEPENDENT_AMBULATORY_CARE_PROVIDER_SITE_OTHER)
Admission: RE | Admit: 2016-06-16 | Discharge: 2016-06-16 | Disposition: A | Discharge status: Home / Self Care | Payer: Medicare Other | Source: Ambulatory Visit | Attending: Family Medicine | Admitting: Family Medicine

## 2016-06-16 ENCOUNTER — Telehealth: Payer: Self-pay | Admitting: *Deleted

## 2016-06-16 ENCOUNTER — Ambulatory Visit (INDEPENDENT_AMBULATORY_CARE_PROVIDER_SITE_OTHER): Payer: Medicare Other | Admitting: Family Medicine

## 2016-06-16 VITALS — BP 116/56 | HR 64 | Temp 97.7°F | Resp 22 | Wt 110.5 lb

## 2016-06-16 DIAGNOSIS — R7981 Abnormal blood-gas level: Secondary | ICD-10-CM | POA: Diagnosis not present

## 2016-06-16 DIAGNOSIS — D631 Anemia in chronic kidney disease: Secondary | ICD-10-CM | POA: Diagnosis not present

## 2016-06-16 DIAGNOSIS — R0902 Hypoxemia: Secondary | ICD-10-CM | POA: Diagnosis not present

## 2016-06-16 DIAGNOSIS — Z992 Dependence on renal dialysis: Secondary | ICD-10-CM

## 2016-06-16 DIAGNOSIS — I272 Pulmonary hypertension, unspecified: Secondary | ICD-10-CM | POA: Diagnosis not present

## 2016-06-16 DIAGNOSIS — N2581 Secondary hyperparathyroidism of renal origin: Secondary | ICD-10-CM | POA: Diagnosis not present

## 2016-06-16 DIAGNOSIS — R0609 Other forms of dyspnea: Secondary | ICD-10-CM

## 2016-06-16 DIAGNOSIS — I2510 Atherosclerotic heart disease of native coronary artery without angina pectoris: Secondary | ICD-10-CM

## 2016-06-16 DIAGNOSIS — I5032 Chronic diastolic (congestive) heart failure: Secondary | ICD-10-CM

## 2016-06-16 DIAGNOSIS — E782 Mixed hyperlipidemia: Secondary | ICD-10-CM | POA: Diagnosis not present

## 2016-06-16 DIAGNOSIS — N186 End stage renal disease: Secondary | ICD-10-CM | POA: Diagnosis not present

## 2016-06-16 DIAGNOSIS — E1129 Type 2 diabetes mellitus with other diabetic kidney complication: Secondary | ICD-10-CM | POA: Diagnosis not present

## 2016-06-16 LAB — CBC
HEMATOCRIT: 34.1 % — AB (ref 36.0–46.0)
HEMOGLOBIN: 10.8 g/dL — AB (ref 12.0–15.0)
MCHC: 31.6 g/dL (ref 30.0–36.0)
MCV: 92 fl (ref 78.0–100.0)
PLATELETS: 208 10*3/uL (ref 150.0–400.0)
RBC: 3.7 Mil/uL — ABNORMAL LOW (ref 3.87–5.11)
RDW: 19.8 % — ABNORMAL HIGH (ref 11.5–15.5)
WBC: 6.4 10*3/uL (ref 4.0–10.5)

## 2016-06-16 LAB — BRAIN NATRIURETIC PEPTIDE: Pro B Natriuretic peptide (BNP): 2034 pg/mL — ABNORMAL HIGH (ref 0.0–100.0)

## 2016-06-16 LAB — BASIC METABOLIC PANEL
BUN: 24 mg/dL — AB (ref 6–23)
CHLORIDE: 96 meq/L (ref 96–112)
CO2: 34 mEq/L — ABNORMAL HIGH (ref 19–32)
CREATININE: 3.68 mg/dL — AB (ref 0.40–1.20)
Calcium: 8.7 mg/dL (ref 8.4–10.5)
GFR: 12.53 mL/min — CL (ref >60.00)
GLUCOSE: 122 mg/dL — AB (ref 70–99)
POTASSIUM: 3.4 meq/L — AB (ref 3.5–5.1)
Sodium: 142 mEq/L (ref 135–145)

## 2016-06-16 LAB — TROPONIN I: TNIDX: 0.04 ug/l (ref 0.00–0.06)

## 2016-06-16 NOTE — Patient Instructions (Signed)
It was a pleasure to see you today. Your chest xray did not show anything worrisome.  I have put in an order for home oxygen, you should be contacted today to have it delivered I will review your blood tests with Dr. Darnell Level and we will determine follow up and let you know.  If you get worse in the meantime, please let us know.

## 2016-06-16 NOTE — Progress Notes (Addendum)
Subjective:    Patient ID: [name], female    DOB: Jan 24, 1932, 83 y.o.   MRN: [id number]  HPI This is a 83 yo female who presents today with worsening DOE x1 month.  She had dialysis this morning, reports her o2 sat was in 74s. Hgb 11. No cough, no PND. Worse after eating and walking. No chest pain, no edema. No wheeze.  Mouth tastes like acid over the last several days. No acid reflux. No nausea, some burping yesterday. She sees GI (Dr. Silverio Decamp) and has an appointment with her next month. She takes pantoprazole 40 mg BID.  Saw cardiologist, Dr. Stanford Breed, 05/29/16.  He increased metoprolol and amlodipine, she had increased fatigue and nausea and he decreased metoprolol to previous dose (12.5 mg BID).   Past Medical History:  Diagnosis Date  . Anemia in chronic kidney disease    IV iron infusion and anemia from GAVE blood loss.  . Arthritis     hands  . Bradycardia 2014   a. Noted 06/2012.  . Breast cancer ([place])    Left Breast 13 yrs. ago  . CAD (coronary artery disease), native coronary artery    3 vessel CAD by cath 04/2015  . Cardiac murmur    a. thought due to AVF (2D echo 06/2012 without significant valvular disease).   . Ectropion of right lower eyelid 11/2012   s/p surgery (Dr. [name])  . End stage renal disease on dialysis [facility])    HD M,W,F [place] AVF now LUE AVF  . Facial droop 1935   acquired during forceps delivery, some residual R visual loss  . Family history of adverse reaction to anesthesia    brother trouble waking up and confusion, now deceased  . GAVE (gastric antral vascular ectasia) 08/2014   s/p EGD with APC  . GERD (gastroesophageal reflux disease)   . Hearing loss    Bilateral hearing aids  . History of blood transfusion 5 weeks ago  . Hx of breast cancer 2004  . Hyperlipidemia   . Hypertension   . Hypotension    a. Associated w/ dialysis.  . Osteoporosis 11/2013   DEXA -3.4 radius  . Sight impaired    Left: wears contact.  Right: diminished peripheral vision      Review of Systems  Constitutional: Positive for fatigue. Negative for chills, fever and unexpected weight change.  HENT: Negative for congestion and rhinorrhea.   Respiratory: Positive for shortness of breath. Negative for cough, chest tightness and wheezing.   Cardiovascular: Negative for chest pain, palpitations and leg swelling.  Gastrointestinal: Negative for vomiting.       Objective:   Physical Exam  Constitutional: She is oriented to person, place, and time. No distress.  HENT:  Chronic right side facial droop.   Cardiovascular: Normal rate, regular rhythm and normal heart sounds.   Pulmonary/Chest: Effort normal and breath sounds normal. No respiratory distress. She has no wheezes. She has no rales.  Musculoskeletal: She exhibits no edema.  Neurological: She is alert and oriented to person, place, and time.  Skin: Skin is warm and dry. She is not diaphoretic.  Psychiatric: She has a normal mood and affect. Her behavior is normal. Judgment and thought content normal.  Vitals reviewed.     BP (!) 116/56 (BP Location: Right Arm, Patient Position: Sitting, Cuff Size: Normal)   Pulse 64   Temp 97.7 F (36.5 C) (Oral)   Wt 110 lb 8 oz (50.1 kg)  SpO2 (!) 86%   BMI 23.91 kg/m  Wt Readings from Last 3 Encounters:  06/16/16 110 lb 8 oz (50.1 kg)  06/01/16 114 lb (51.7 kg)  04/27/16 115 lb 8 oz (52.4 kg)   BP Readings from Last 3 Encounters:  06/16/16 (!) 116/56  06/01/16 (!) 148/74  04/27/16 (!) 158/60   Pulse ox upon arrival to clinic 84-87%, on O2, 2 liter nasal cannula 95-98% sitting and with ambulation, subjective improvement in dyspnea.   Dg Chest 2 View  Result Date: 06/16/2016 CLINICAL DATA:  Low O2 sats EXAM: CHEST  2 VIEW COMPARISON:  06/13/2015 FINDINGS: Elevation of the right hemidiaphragm. Cardiomegaly. No confluent airspace opacity or effusion. No acute bony abnormality appear IMPRESSION: Stable elevation of the  right hemidiaphragm. Cardiomegaly. No active disease. Electronically Signed   By: Rolm Baptise M.D.   On: 06/16/2016 11:11       Assessment & Plan:  Discussed with Dr. Darnell Level 1. Dyspnea on exertion - not sure if due to underlying CAD/CHF, will check labs, CXR and determine follow up based on results - improved O2 sats on 2L nasal cannula, order placed for home O2 - DG Chest 2 View; Future - Brain natriuretic peptide - Basic metabolic panel - Troponin I - CBC - For home use only DME oxygen - RTC/ER precautions reviewed  2. Anemia in chronic kidney disease, on chronic dialysis (HCC) - CBC  3. Coronary artery disease involving native coronary artery of native heart without angina pectoris - Brain natriuretic peptide - Troponin I  4. Low oxygen saturation - For home use only DME oxygen  5. Chronic diastolic congestive heart failure - history of hypotension and bradycardia, no room to increase beta blocker  6. Moderate to severe pulmonary hypertension - For home use only DME oxygen  Clarene Reamer, FNP-BC  [facility]  06/16/2016 11:56 AM

## 2016-06-16 NOTE — Telephone Encounter (Signed)
CRITICAL VALUE STICKER  CRITICAL VALUE: GFR 12.53  RECEIVER :[place] NOTIFIED: 06/16/16 4:00  MESSENGER :Grier Rocher MD NOTIFIED: Tor Netters, NP  TIME OF NOTIFICATION:4:01  RESPONSE:

## 2016-06-16 NOTE — Telephone Encounter (Signed)
Received critical value. Patient with end stage renal disease on dialysis.

## 2016-06-16 NOTE — Progress Notes (Signed)
Pre visit review using our clinic review tool, if applicable. No additional management support is needed unless otherwise documented below in the visit note. 

## 2016-06-18 ENCOUNTER — Telehealth: Payer: Self-pay | Admitting: Family Medicine

## 2016-06-18 DIAGNOSIS — D631 Anemia in chronic kidney disease: Secondary | ICD-10-CM | POA: Diagnosis not present

## 2016-06-18 DIAGNOSIS — N2581 Secondary hyperparathyroidism of renal origin: Secondary | ICD-10-CM | POA: Diagnosis not present

## 2016-06-18 DIAGNOSIS — N186 End stage renal disease: Secondary | ICD-10-CM | POA: Diagnosis not present

## 2016-06-18 DIAGNOSIS — E782 Mixed hyperlipidemia: Secondary | ICD-10-CM | POA: Diagnosis not present

## 2016-06-18 DIAGNOSIS — E1129 Type 2 diabetes mellitus with other diabetic kidney complication: Secondary | ICD-10-CM | POA: Diagnosis not present

## 2016-06-18 NOTE — Telephone Encounter (Signed)
Noted. Thanks.

## 2016-06-18 NOTE — Telephone Encounter (Signed)
Patient called and said she received her oxygen.  Patient has appointment with Cardiology on 06/30/16.  Patient said [name] doesn't need to call her back unless she has a question for her.

## 2016-06-21 DIAGNOSIS — E782 Mixed hyperlipidemia: Secondary | ICD-10-CM | POA: Diagnosis not present

## 2016-06-21 DIAGNOSIS — N186 End stage renal disease: Secondary | ICD-10-CM | POA: Diagnosis not present

## 2016-06-21 DIAGNOSIS — D631 Anemia in chronic kidney disease: Secondary | ICD-10-CM | POA: Diagnosis not present

## 2016-06-21 DIAGNOSIS — E1129 Type 2 diabetes mellitus with other diabetic kidney complication: Secondary | ICD-10-CM | POA: Diagnosis not present

## 2016-06-21 DIAGNOSIS — N2581 Secondary hyperparathyroidism of renal origin: Secondary | ICD-10-CM | POA: Diagnosis not present

## 2016-06-22 ENCOUNTER — Telehealth: Payer: Self-pay

## 2016-06-22 NOTE — Telephone Encounter (Signed)
plz schedule appt for tomorrow. If she has zantac 150mg  or pepcid may take at night time.  How is pulse ox on new oxygen 2LNC?

## 2016-06-22 NOTE — Telephone Encounter (Signed)
Left v/m; pt was seen 06/16/16;pt has acidity in bowels; now pt vomiting on and off; last night when pt vomited she saw pink in vomitus. No vomiting today. Pt has loose BM x 5 since 7:30 AM today; no blood in loose BM.No abd pain, no fever. Pt has appt to see GI doctor on 07/06/16. Pt request cb with what she should do. [place].

## 2016-06-22 NOTE — Telephone Encounter (Signed)
Spoke with patient and appt scheduled. She doesn't know exact pulse ox reading, but says she is feeling better on the oxygen.

## 2016-06-23 ENCOUNTER — Encounter: Payer: Self-pay | Admitting: Family Medicine

## 2016-06-23 ENCOUNTER — Ambulatory Visit (INDEPENDENT_AMBULATORY_CARE_PROVIDER_SITE_OTHER): Payer: Medicare Other | Admitting: Family Medicine

## 2016-06-23 ENCOUNTER — Telehealth: Payer: Self-pay

## 2016-06-23 VITALS — BP 118/62 | HR 74 | Temp 98.0°F | Wt 110.0 lb

## 2016-06-23 DIAGNOSIS — E1129 Type 2 diabetes mellitus with other diabetic kidney complication: Secondary | ICD-10-CM | POA: Diagnosis not present

## 2016-06-23 DIAGNOSIS — N2581 Secondary hyperparathyroidism of renal origin: Secondary | ICD-10-CM | POA: Diagnosis not present

## 2016-06-23 DIAGNOSIS — M545 Low back pain: Secondary | ICD-10-CM

## 2016-06-23 DIAGNOSIS — I248 Other forms of acute ischemic heart disease: Secondary | ICD-10-CM

## 2016-06-23 DIAGNOSIS — E782 Mixed hyperlipidemia: Secondary | ICD-10-CM | POA: Diagnosis not present

## 2016-06-23 DIAGNOSIS — R197 Diarrhea, unspecified: Secondary | ICD-10-CM | POA: Diagnosis not present

## 2016-06-23 DIAGNOSIS — D631 Anemia in chronic kidney disease: Secondary | ICD-10-CM | POA: Diagnosis not present

## 2016-06-23 DIAGNOSIS — R112 Nausea with vomiting, unspecified: Secondary | ICD-10-CM | POA: Diagnosis not present

## 2016-06-23 DIAGNOSIS — I272 Pulmonary hypertension, unspecified: Secondary | ICD-10-CM | POA: Diagnosis not present

## 2016-06-23 DIAGNOSIS — N186 End stage renal disease: Secondary | ICD-10-CM

## 2016-06-23 DIAGNOSIS — K31819 Angiodysplasia of stomach and duodenum without bleeding: Secondary | ICD-10-CM | POA: Diagnosis not present

## 2016-06-23 DIAGNOSIS — I5033 Acute on chronic diastolic (congestive) heart failure: Secondary | ICD-10-CM

## 2016-06-23 DIAGNOSIS — G8929 Other chronic pain: Secondary | ICD-10-CM

## 2016-06-23 DIAGNOSIS — J9611 Chronic respiratory failure with hypoxia: Secondary | ICD-10-CM

## 2016-06-23 DIAGNOSIS — I2510 Atherosclerotic heart disease of native coronary artery without angina pectoris: Secondary | ICD-10-CM | POA: Diagnosis not present

## 2016-06-23 DIAGNOSIS — Z992 Dependence on renal dialysis: Secondary | ICD-10-CM

## 2016-06-23 DIAGNOSIS — Z9981 Dependence on supplemental oxygen: Secondary | ICD-10-CM

## 2016-06-23 DIAGNOSIS — D500 Iron deficiency anemia secondary to blood loss (chronic): Secondary | ICD-10-CM

## 2016-06-23 MED ORDER — TRAMADOL HCL 50 MG PO TABS: 25.0000 mg | ORAL_TABLET | Freq: Two times a day (BID) | ORAL | 0 refills | Status: DC | PRN

## 2016-06-23 MED ORDER — ONDANSETRON HCL 4 MG PO TABS: 4.0000 mg | ORAL_TABLET | Freq: Three times a day (TID) | ORAL | 0 refills | Status: AC | PRN

## 2016-06-23 MED ORDER — RANITIDINE HCL 150 MG PO TABS: 150.0000 mg | ORAL_TABLET | Freq: Every day | ORAL | Status: DC

## 2016-06-23 NOTE — Assessment & Plan Note (Addendum)
Concern for worsening systolic CHF in setting of new oxygen requirement and mild pedal edema noted.  Has f/u with cards next week. Does not make urine.  ?need to continue lowering post-HD weight goal (currently at 51kg per patient).

## 2016-06-23 NOTE — Telephone Encounter (Signed)
Please schedule for EGD at [facility] next week with APC for GAVE. Thanks

## 2016-06-23 NOTE — Telephone Encounter (Signed)
Case opened. Patient not answering phone. Left her a message. I need to find out what days she can go. She is also a dialysis patient.

## 2016-06-23 NOTE — Assessment & Plan Note (Signed)
O2 therapy started last week. ?related to worsening ischemic CM, may need updated echo.

## 2016-06-23 NOTE — Assessment & Plan Note (Addendum)
Mild by echo 05/2015. May need updated echo. Has f/u with cards next week.

## 2016-06-23 NOTE — Patient Instructions (Addendum)
I wonder if heart pumping function may have worsened leading to new oxygen requirement.  Continue supplemental oxygen at 2 liters.  May use zofran as needed for nausea. Continue protonix 40mg  twice daily. Add zantac 150mg  at bedtime. Ask Dr Silverio Decamp about carafate.  Keep follow up with GI and with cardiology.  Transfusion threshold would be anemia with hemoglobin <9.  Return to see me in 1 month for follow up visit.

## 2016-06-23 NOTE — Assessment & Plan Note (Signed)
Tramadol refilled. Pt states this does not cause nausea.

## 2016-06-23 NOTE — Progress Notes (Signed)
BP 118/62   Pulse 74   Temp 98 F (36.7 C) (Oral)   Wt 110 lb (49.9 kg)   SpO2 97% Comment: 2LPM  BMI 23.80 kg/m    CC: vomiting Subjective:    Patient ID: [name], female    DOB: [birth year], 83 y.o.   MRN: [id number]  HPI: [name] is a 83 y.o. female presenting on 06/23/2016 for Emesis (Hgb 9.6-? GI bleed)   See recent note for details. Relatively new oxygen requirement in setting of worsening DOE x1 month - started on supplemental O2 last week. Though worsening CHF vs anemia related. BNP last week 2034 (in setting of ESRD). CXR - no acute process, stable cardiomegaly. TnI nl. Known chronic IDA from ESRD and GAVE followed by Dr Silverio Decamp (last EGD 02/2016 reviewed - mod HH, bleeding gastric angioectasias treated with APC). GI appt 07/06/2016. Cards appt 06/30/2016. O2 sat 04/27/2016 was 97% on [place].   Comes in today with O2 sat low 80s - however tubing had come off O2 tank. Reconnected and O2 quickly increased to 97% on 2L [place]. Dyspnea has improved on oxygen. She does notice some dyspnea after meals.   Here today because of nausea/vomiting episodes over the last 2 days - episodes happened after dinner. Few episodes of diarrhea over last 2 days as well with some abdominal cramping but now better. Some darker stools but no melena. She did have some pink in emesis but no coffee ground emesis. No fevers. No sick contacts at home. No new foods. Denies UTI sxs. No new chest pain or tightness.   She is regular with PPI protonix 40mg  BID. She does take 3 tums after each meal. She does take aspirin 81mg  daily for h/o inoperable CAD.   Requests tramadol Rx refill - denies nausea with this. Takes 1/2 tab PRN back pain.   Hgb at HD this morning 9.6. Prior Hgb was 10.8 last week. Transfusion threshold is <9.  Lab Results  Component Value Date   WBC 6.4 06/16/2016   HGB 10.8 (L) 06/16/2016   HCT 34.1 (L) 06/16/2016   MCV 92.0 06/16/2016   PLT 208.0 06/16/2016    Pt states new renal dry  weight is 51kg as of 2-3 wks ago (5kg decrease from prior).   Relevant past medical, surgical, family and social history reviewed and updated as indicated. Interim medical history since our last visit reviewed. Allergies and medications reviewed and updated. Outpatient Medications Prior to Visit  Medication Sig Dispense Refill  . acetaminophen (TYLENOL) 500 MG tablet Take 1,000 mg by mouth daily.     Marland Kitchen amLODipine (NORVASC) 5 MG tablet Take 1 tablet (5 mg total) by mouth at bedtime. 90 tablet 3  . aspirin EC 81 MG tablet Take 1 tablet (81 mg total) by mouth daily. 90 tablet 3  . atorvastatin (LIPITOR) 80 MG tablet Take 1 tablet (80 mg total) by mouth at bedtime. 90 tablet 3  . B Complex-C-Folic Acid (RENA-VITE PO) Take 1 tablet by mouth daily with breakfast.     . Calcium Carbonate Antacid (TUMS ULTRA 1000 PO) Take 3,000 mg by mouth 3 (three) times daily after meals.     . cholecalciferol (VITAMIN D) 1000 UNITS tablet Take 1,000 Units by mouth every Monday, Wednesday, and Friday.     . metoprolol tartrate (LOPRESSOR) 25 MG tablet Take 1 tablet (25 mg total) by mouth 2 (two) times daily. (Patient taking differently: Take 12.5 mg by mouth 2 (two) times daily. )  180 tablet 3  . pantoprazole (PROTONIX) 40 MG tablet TAKE 1 TABLET BY MOUTH TWICE A DAY 60 tablet 5  . Polyethyl Glycol-Propyl Glycol (SYSTANE) 0.4-0.3 % GEL ophthalmic gel Place 1 application into the right eye every 6 (six) hours as needed (eye dryness or pain).    . traMADol (ULTRAM) 50 MG tablet Take 0.5 tablets (25 mg total) by mouth 2 (two) times daily as needed for moderate pain. 20 tablet 0   No facility-administered medications prior to visit.      Per HPI unless specifically indicated in ROS section below Review of Systems     Objective:    BP 118/62   Pulse 74   Temp 98 F (36.7 C) (Oral)   Wt 110 lb (49.9 kg)   SpO2 97% Comment: 2LPM  BMI 23.80 kg/m   Wt Readings from Last 3 Encounters:  06/23/16 110 lb (49.9 kg)    06/16/16 110 lb 8 oz (50.1 kg)  06/01/16 114 lb (51.7 kg)    Physical Exam  Constitutional: She appears well-developed and well-nourished. No distress.  Supplemental O2 by [place]  HENT:  Mouth/Throat: Oropharynx is clear and moist. No oropharyngeal exudate.  Cardiovascular: Normal rate, regular rhythm, normal heart sounds and intact distal pulses.   No murmur heard. Pulmonary/Chest: Effort normal and breath sounds normal. No respiratory distress. She has no wheezes. She has no rales.  Musculoskeletal: She exhibits edema (1+ pitting edema).  Skin: Skin is warm and dry. No rash noted.  Nursing note and vitals reviewed.  Results for orders placed or performed in visit on 06/16/16  Brain natriuretic peptide  Result Value Ref Range   Pro B Natriuretic peptide (BNP) 2,034.0 (H) 0.0 - 100.0 pg/mL  Basic metabolic panel  Result Value Ref Range   Sodium 142 135 - 145 mEq/L   Potassium 3.4 (L) 3.5 - 5.1 mEq/L   Chloride 96 96 - 112 mEq/L   CO2 34 (H) 19 - 32 mEq/L   Glucose, Bld 122 (H) 70 - 99 mg/dL   BUN 24 (H) 6 - 23 mg/dL   Creatinine, Ser 3.68 (H) 0.40 - 1.20 mg/dL   Calcium 8.7 8.4 - 10.5 mg/dL   GFR 12.53 (LL) >60.00 mL/min  Troponin I  Result Value Ref Range   TNIDX 0.04 0.00 - 0.06 ug/l  CBC  Result Value Ref Range   WBC 6.4 4.0 - 10.5 K/uL   RBC 3.70 (L) 3.87 - 5.11 Mil/uL   Platelets 208.0 150.0 - 400.0 K/uL   Hemoglobin 10.8 (L) 12.0 - 15.0 g/dL   HCT 34.1 (L) 36.0 - 46.0 %   MCV 92.0 78.0 - 100.0 fl   MCHC 31.6 30.0 - 36.0 g/dL   RDW 19.8 (H) 11.5 - 15.5 %      Assessment & Plan:  Over 40 minutes were spent face-to-face with the patient during this encounter and >50% of that time was spent on counseling and coordination of care  Problem List Items Addressed This Visit    Acute on chronic diastolic congestive heart failure (HCC)    Concern for worsening systolic CHF in setting of new oxygen requirement and mild pedal edema noted.  Has f/u with cards next week. Does  not make urine.  ?need to continue lowering post-HD weight goal (currently at 51kg per patient).      Anemia due to blood loss, chronic    Transfusion threshold <9      CAD-severe multivessel, not CABG candidate   Chronic  midline low back pain without sciatica    Tramadol refilled. Pt states this does not cause nausea.       Relevant Medications   traMADol (ULTRAM) 50 MG tablet   Chronic respiratory failure with hypoxia, on home O2 therapy ([place])    O2 therapy started last week. ?related to worsening ischemic CM, may need updated echo.       Demand ischemia ([place])   ESRD on dialysis ([place])   GAVE (gastric antral vascular ectasia)   Moderate to severe pulmonary hypertension    Mild by echo 05/2015. May need updated echo. Has f/u with cards next week.       Nausea vomiting and diarrhea - Primary    2d h/o new nausea/vomiting/diarrhea. ?viral gastroenteritis as today feeling better vs flare of known GAVE. Hgb 9.6 at HD this morning. Transfusion threshold <9. I recommended zofran PRN nausea, and start zantac 150mg  nightly along with her BID PPI. She has close f/u with GI in 2 weeks - ?carafate benefit. Pt agrees with plan.           Follow up plan: Return in about 4 weeks (around 07/21/2016) for follow up visit.  [name], MD

## 2016-06-23 NOTE — Progress Notes (Signed)
Pre visit review using our clinic review tool, if applicable. No additional management support is needed unless otherwise documented below in the visit note. 

## 2016-06-23 NOTE — Telephone Encounter (Signed)
She gets labs every day she is at dialysis. Most recent she has a decrease in her hgb to 9.6 She is not vomiting. Her PCP has given her medication for nausea. She is eating bland and tolerating. Her OV appointment with you is 07/06/16 She is now oxygen dependent and has acute on chronic CHF

## 2016-06-23 NOTE — Assessment & Plan Note (Signed)
2d h/o new nausea/vomiting/diarrhea. ?viral gastroenteritis as today feeling better vs flare of known GAVE. Hgb 9.6 at HD this morning. Transfusion threshold <9. I recommended zofran PRN nausea, and start zantac 150mg  nightly along with her BID PPI. She has close f/u with GI in 2 weeks - ?carafate benefit. Pt agrees with plan.

## 2016-06-23 NOTE — Assessment & Plan Note (Signed)
Transfusion threshold <9

## 2016-06-24 ENCOUNTER — Other Ambulatory Visit: Payer: Self-pay

## 2016-06-24 NOTE — Telephone Encounter (Signed)
Spoke with the patient. Originally had her scheduled for Wednesday 06/30/16. Moved the appointment for the EGD to 07/01/16 arrive at 7:45 am fasting.

## 2016-06-25 DIAGNOSIS — E1129 Type 2 diabetes mellitus with other diabetic kidney complication: Secondary | ICD-10-CM | POA: Diagnosis not present

## 2016-06-25 DIAGNOSIS — N2581 Secondary hyperparathyroidism of renal origin: Secondary | ICD-10-CM | POA: Diagnosis not present

## 2016-06-25 DIAGNOSIS — N186 End stage renal disease: Secondary | ICD-10-CM | POA: Diagnosis not present

## 2016-06-25 DIAGNOSIS — D631 Anemia in chronic kidney disease: Secondary | ICD-10-CM | POA: Diagnosis not present

## 2016-06-25 DIAGNOSIS — E782 Mixed hyperlipidemia: Secondary | ICD-10-CM | POA: Diagnosis not present

## 2016-06-26 DIAGNOSIS — E1129 Type 2 diabetes mellitus with other diabetic kidney complication: Secondary | ICD-10-CM | POA: Diagnosis not present

## 2016-06-26 DIAGNOSIS — Z992 Dependence on renal dialysis: Secondary | ICD-10-CM | POA: Diagnosis not present

## 2016-06-26 DIAGNOSIS — N186 End stage renal disease: Secondary | ICD-10-CM | POA: Diagnosis not present

## 2016-06-28 ENCOUNTER — Encounter (HOSPITAL_COMMUNITY): Payer: Self-pay | Admitting: *Deleted

## 2016-06-28 ENCOUNTER — Telehealth: Payer: Self-pay | Admitting: Gastroenterology

## 2016-06-28 DIAGNOSIS — N186 End stage renal disease: Secondary | ICD-10-CM | POA: Diagnosis not present

## 2016-06-28 DIAGNOSIS — E782 Mixed hyperlipidemia: Secondary | ICD-10-CM | POA: Diagnosis not present

## 2016-06-28 DIAGNOSIS — D631 Anemia in chronic kidney disease: Secondary | ICD-10-CM | POA: Diagnosis not present

## 2016-06-28 DIAGNOSIS — E1129 Type 2 diabetes mellitus with other diabetic kidney complication: Secondary | ICD-10-CM | POA: Diagnosis not present

## 2016-06-28 DIAGNOSIS — N2581 Secondary hyperparathyroidism of renal origin: Secondary | ICD-10-CM | POA: Diagnosis not present

## 2016-06-28 NOTE — Telephone Encounter (Signed)
She is asking what medications she should take prior to her procedure. She is to be at the hospital at 7:45 am on 07/01/16. She will be fasting after midnight. I recommended she bring her medications with her to the appointment, but not take any prior to her procedure. She reports she is not nauseated but does feel an "acid taste" in her mouth.

## 2016-06-29 ENCOUNTER — Encounter (HOSPITAL_COMMUNITY): Payer: Self-pay | Admitting: *Deleted

## 2016-06-30 ENCOUNTER — Encounter: Payer: Self-pay | Admitting: Nurse Practitioner

## 2016-06-30 ENCOUNTER — Ambulatory Visit (INDEPENDENT_AMBULATORY_CARE_PROVIDER_SITE_OTHER): Payer: Medicare Other | Admitting: Nurse Practitioner

## 2016-06-30 VITALS — BP 120/58 | HR 72 | Ht <= 58 in | Wt 109.0 lb

## 2016-06-30 DIAGNOSIS — I209 Angina pectoris, unspecified: Secondary | ICD-10-CM

## 2016-06-30 DIAGNOSIS — N186 End stage renal disease: Secondary | ICD-10-CM | POA: Diagnosis not present

## 2016-06-30 DIAGNOSIS — N2581 Secondary hyperparathyroidism of renal origin: Secondary | ICD-10-CM | POA: Diagnosis not present

## 2016-06-30 DIAGNOSIS — I25119 Atherosclerotic heart disease of native coronary artery with unspecified angina pectoris: Secondary | ICD-10-CM

## 2016-06-30 DIAGNOSIS — D631 Anemia in chronic kidney disease: Secondary | ICD-10-CM | POA: Diagnosis not present

## 2016-06-30 DIAGNOSIS — I248 Other forms of acute ischemic heart disease: Secondary | ICD-10-CM

## 2016-06-30 DIAGNOSIS — E785 Hyperlipidemia, unspecified: Secondary | ICD-10-CM | POA: Diagnosis not present

## 2016-06-30 DIAGNOSIS — I10 Essential (primary) hypertension: Secondary | ICD-10-CM | POA: Diagnosis not present

## 2016-06-30 DIAGNOSIS — R0609 Other forms of dyspnea: Secondary | ICD-10-CM

## 2016-06-30 DIAGNOSIS — E1129 Type 2 diabetes mellitus with other diabetic kidney complication: Secondary | ICD-10-CM | POA: Diagnosis not present

## 2016-06-30 DIAGNOSIS — E782 Mixed hyperlipidemia: Secondary | ICD-10-CM | POA: Diagnosis not present

## 2016-06-30 NOTE — Progress Notes (Signed)
Office Visit    Patient Name: [name] Date of Encounter: 06/30/2016  Primary Care Provider:  [name], MD Primary Cardiologist:  B. Stanford Breed, MD   Chief Complaint    83 y/o ? with a h/o multivessel CAD, HTN, HL, ESRD on HD, and anemia, who presents for evaluation related to ongoing DOE.  Past Medical History    Past Medical History:  Diagnosis Date  . Anemia in chronic kidney disease    IV iron infusion and anemia from GAVE blood loss.  . Arthritis     hands  . Bradycardia 2014   a. Noted 06/2012.  . Breast cancer ([place])    Left Breast 13 yrs. ago, RADIATION TX DONE  . CAD (coronary artery disease), native coronary artery    a. 03/2015 Cath: LAD 92m, D2 75, LCX 80, OM1 85, RI 99, RCA 70p/m, 95d, AM 85-->poor candidate for CABG & PCI-->Med Rx.  . Cardiac murmur    a. thought due to AVF (2D echo 06/2012 without significant valvular disease).   . Carotid arterial disease ([place])    a. 04/2015 Carotid U/S: 1-39% bilat ICA stenosis.  Marland Kitchen Ectropion of right lower eyelid 11/2012   s/p surgery (Dr. [name])  . End stage renal disease on dialysis [facility])    HD M,W,F [place] AVF now LUE AVF HD X 4 YRS   . Facial droop 1935   acquired during forceps delivery, some residual R visual loss  . Family history of adverse reaction to anesthesia    brother trouble waking up and confusion, now deceased  . GAVE (gastric antral vascular ectasia) 08/2014   s/p EGD with APC  . GERD (gastroesophageal reflux disease)   . Hearing loss    Bilateral hearing aids  . History of blood transfusion 5 weeks ago  . History of home oxygen therapy    2 LITERS  OXYGEN PRN WHEN WALKING  . Hx of breast cancer 2004  . Hyperlipidemia   . Hypertension   . Hypotension    a. Associated w/ dialysis.  . Moderate mitral regurgitation    a. 05/2015 Echo: EF 55-60%, no rwma, triv AI, Ao sclerosis, mod MR, mildly to mod dil LA, PASP 46mmHg.  . Osteoporosis 11/2013   DEXA -3.4 radius IN  BACK  . Sight impaired    Left: wears contact. Right: diminished peripheral vision   Past Surgical History:  Procedure Laterality Date  . AV FISTULA PLACEMENT  07/20/10   Right brachiocephalic AVF  . BASCILIC VEIN TRANSPOSITION Left 07/24/2013   Procedure: BASILIC VEIN TRANSPOSITION;  Surgeon: [name], MD;  Location: [place];  Service: Vascular;  Laterality: Left;  . BREAST BIOPSY Left   . BREAST LUMPECTOMY Left   . CARDIAC CATHETERIZATION N/A 04/10/2015   Procedure: Left Heart Cath and Coronary Angiography;  Surgeon: [name], MD;  Location: [place] CV LAB;  Service: Cardiovascular;  Laterality: N/A;  . CATARACT EXTRACTION Bilateral 1980  . DEXA  11/2015   DEXA T -4.3 [facility]  . ESOPHAGOGASTRODUODENOSCOPY N/A 04/09/2014   Procedure: ESOPHAGOGASTRODUODENOSCOPY (EGD);  Surgeon: [name], MD;  Location: [place];  Service: Endoscopy;  Laterality: N/A;  . ESOPHAGOGASTRODUODENOSCOPY N/A 09/23/2014   Procedure: ESOPHAGOGASTRODUODENOSCOPY (EGD);  Surgeon: [name], MD;  Location: [place];  Service: Endoscopy;  Laterality: N/A;  . ESOPHAGOGASTRODUODENOSCOPY N/A 12/26/2014   neg H pylori, benign biopsies; ESOPHAGOGASTRODUODENOSCOPY (EGD);  Surgeon: [name], MD  . ESOPHAGOGASTRODUODENOSCOPY N/A 06/15/2015  Procedure: ESOPHAGOGASTRODUODENOSCOPY (EGD);  Surgeon: Milus Banister, MD;  Location: [place];  Service: Endoscopy;  Laterality: N/A;  . ESOPHAGOGASTRODUODENOSCOPY N/A 06/27/2015   Procedure: ESOPHAGOGASTRODUODENOSCOPY (EGD);  Surgeon: Mauri Pole, MD;  Location: [facility];  Service: Endoscopy;  Laterality: N/A;  . ESOPHAGOGASTRODUODENOSCOPY (EGD) WITH PROPOFOL N/A 11/12/2014   Procedure: ESOPHAGOGASTRODUODENOSCOPY (EGD) WITH PROPOFOL;  Surgeon: [name], MD;  Location: [facility];  Service: Endoscopy;  Laterality: N/A;  . ESOPHAGOGASTRODUODENOSCOPY (EGD) WITH PROPOFOL N/A 07/31/2015   Procedure: ESOPHAGOGASTRODUODENOSCOPY (EGD) WITH  PROPOFOL;  Surgeon: Mauri Pole, MD;  Location: [facility];  Service: Endoscopy;  Laterality: N/A;  . ESOPHAGOGASTRODUODENOSCOPY (EGD) WITH PROPOFOL N/A 03/08/2016   Procedure: ESOPHAGOGASTRODUODENOSCOPY (EGD) WITH PROPOFOL;  Surgeon: Mauri Pole, MD;  Location: [facility];  Service: Endoscopy;  Laterality: N/A;  . EYE SURGERY  1966   regular cataract  . HERNIA REPAIR    . HOT HEMOSTASIS N/A 11/12/2014   Procedure: HOT HEMOSTASIS (ARGON PLASMA COAGULATION/BICAP);  Surgeon: [name], MD;  Location[facility];  Service: Endoscopy;  Laterality: N/A;  . HOT HEMOSTASIS N/A 06/27/2015   Procedure: HOT HEMOSTASIS (ARGON PLASMA COAGULATION/BICAP);  Surgeon: Mauri Pole, MD;  Location: [facility];  Service: Endoscopy;  Laterality: N/A;  . HOT HEMOSTASIS N/A 07/31/2015   Procedure: HOT HEMOSTASIS (ARGON PLASMA COAGULATION/BICAP);  Surgeon: Mauri Pole, MD;  Location: [facility];  Service: Endoscopy;  Laterality: N/A;  . PERIPHERAL VASCULAR CATHETERIZATION N/A 08/15/2014   Procedure: A/V Shuntogram/Fistulagram;  Surgeon: [name], MD;  Location: [place] [facility];  Service: Cardiovascular;  Laterality: N/A;  . PERIPHERAL VASCULAR CATHETERIZATION N/A 04/28/2015   Procedure: A/V Shuntogram/Fistulagram;  Surgeon: [name], MD;  Location: [place] [facility];  Service: Cardiovascular;  Laterality: N/A;  . PERIPHERAL VASCULAR CATHETERIZATION Left 04/28/2015   Procedure: A/V Shunt Intervention;  Surgeon: [name], MD;  Location: [place] CV LAB;  Service: Cardiovascular;  Laterality: Left;  . PERIPHERAL VASCULAR CATHETERIZATION Left 08/07/2015   Procedure: A/V Shuntogram/Fistulagram;  Surgeon: [name], MD;  Location: [place] CV LAB;  Service: Cardiovascular;  Laterality: Left;  . PERIPHERAL VASCULAR CATHETERIZATION N/A 08/07/2015   Procedure: A/V Shunt Intervention;  Surgeon: [name], MD;  Location: [place] CV LAB;  Service:  Cardiovascular;  Laterality: N/A;  . PERIPHERAL VASCULAR CATHETERIZATION Left 11/03/2015   Procedure: A/V Shuntogram/Fistulagram;  Surgeon: [name], MD;  Location: [place] CV LAB;  Service: Cardiovascular;  Laterality: Left;  . PERIPHERAL VASCULAR CATHETERIZATION N/A 11/03/2015   Procedure: A/V Shunt Intervention;  Surgeon: [name], MD;  Location: [place] CV LAB;  Service: Cardiovascular;  Laterality: N/A;  . PERIPHERAL VASCULAR CATHETERIZATION N/A 03/19/2016   Procedure: A/V Fistulagram;  Surgeon: Katha Cabal, MD;  Location: [place] CV LAB;  Service: Cardiovascular;  Laterality: N/A;  . PERIPHERAL VASCULAR CATHETERIZATION N/A 03/30/2016   Procedure: A/V Fistulagram;  Surgeon: Katha Cabal, MD;  Location: [place] CV LAB;  Service: Cardiovascular;  Laterality: N/A;  . PERIPHERAL VASCULAR CATHETERIZATION N/A 03/30/2016   Procedure: A/V Shunt Intervention;  Surgeon: Katha Cabal, MD;  Location: [place] CV LAB;  Service: Cardiovascular;  Laterality: N/A;  . SHUNTOGRAM N/A 04/16/2011   Procedure: [place];  Surgeon: [name], MD;  Location: [facility] CATH LAB;  Service: Cardiovascular;  Laterality: N/A;  . SHUNTOGRAM Right 07/05/2013   Procedure: FISTULOGRAM;  Surgeon: [name], MD;  Location: [facility] CATH LAB;  Service: Cardiovascular;  Laterality: Right;  . UMBILICAL HERNIA REPAIR  2011  . US ECHOCARDIOGRAPHY  06/2012   LVH, EF 65%, grade 1 diastol dysfunction, mod dliated LAD    Allergies  Allergies  Allergen Reactions  . Sulfa Antibiotics Rash and Other (See Comments)    fever    History of Present Illness    83 y/o ? with the above complex PMH including ESRD, HTN, HL, multivessel CAD, and anemia of chronic dzs.  She had an abnl stress test in 11/2014 revealing anterolateral infarct with peri-infarct ischemia and subsequent cath in 03/2015 revealed severe multivessel CAD as outlined in [place].  She was seen by thoracic surgery and was felt to be  too high risk for bypass.  PCI was also felt to be high risk, and thus she has been medically managed.  Echo in 05/2015 showed nl EF.  Over the past 2 months, she has been having increasing dyspnea on exertion. Earlier this year, she was placed on supplemental oxygen by her primary care provider. She had similar complaints when she saw Dr. Stanford Breed in early March and it was recognized that severe coronary disease may be playing a role, however with history of hypotension during dialysis, addition of anti-anginal therapy was limited. She was seen by primary care March 28 and was noted to have mild lower extremity swelling. It was felt that she may require repeat echo. Since then, her dry weight has been lowered by 1 kg at dialysis, and she has been feeling better. She has noticed improved exercise tolerance is able to walk around the supermarket without dyspnea, so long as she is able to hold onto the cart. She has not always been using her oxygen with activity. She denies chest pain, PND, orthopnea, dizziness, syncope, edema, or early satiety.  Home Medications    Prior to Admission medications   Medication Sig Start Date End Date Taking? Authorizing Provider  acetaminophen (TYLENOL) 500 MG tablet Take 500 mg by mouth daily as needed for moderate pain.    Yes Historical Provider, MD  amLODipine (NORVASC) 5 MG tablet Take 1 tablet (5 mg total) by mouth at bedtime. 06/01/16  Yes Lelon Perla, MD  aspirin EC 81 MG tablet Take 1 tablet (81 mg total) by mouth daily. 03/06/15  Yes Lelon Perla, MD  atorvastatin (LIPITOR) 80 MG tablet Take 1 tablet (80 mg total) by mouth at bedtime. 06/01/16  Yes Lelon Perla, MD  B Complex-C-Folic Acid (RENA-VITE PO) Take 1 tablet by mouth daily with breakfast.    Yes Historical Provider, MD  Calcium Carbonate Antacid (TUMS ULTRA 1000 PO) Take 3,000 mg by mouth 3 (three) times daily after meals.    Yes Historical Provider, MD  cholecalciferol (VITAMIN D) 1000 UNITS  tablet Take 1,000 Units by mouth every Monday, Wednesday, and Friday.    Yes Historical Provider, MD  metoprolol tartrate (LOPRESSOR) 25 MG tablet Take 1 tablet (25 mg total) by mouth 2 (two) times daily. Patient taking differently: Take 12.5 mg by mouth 2 (two) times daily. TAKE 1/2 TABLET (12.5 MG) IN THE MORNING AND 1/2 TABLET (12.5 MG) AT NIGHT. 06/01/16  Yes Lelon Perla, MD  ondansetron (ZOFRAN) 4 MG tablet Take 1 tablet (4 mg total) by mouth every 8 (eight) hours as needed for nausea or vomiting. 06/23/16  Yes [name], MD  pantoprazole (PROTONIX) 40 MG tablet TAKE 1 TABLET BY MOUTH TWICE A DAY Patient taking differently: take 80mg s in the morning 03/15/16  Yes Kavitha  Bary Richard, MD  Polyethyl Glycol-Propyl Glycol (SYSTANE) 0.4-0.3 % GEL ophthalmic gel Place 1 application into the right eye every 6 (six) hours as needed (eye dryness or pain).   Yes Historical Provider, MD  ranitidine (ZANTAC) 150 MG tablet Take 1 tablet (150 mg total) by mouth at bedtime. 06/23/16  Yes [name], MD  traMADol (ULTRAM) 50 MG tablet Take 0.5 tablets (25 mg total) by mouth 2 (two) times daily as needed for moderate pain. 06/23/16  Yes [name], MD    Review of Systems     As above, she has been having dyspnea on exertion, though this is better over the past week or so.  She denies chest pain, palpitations, PND, orthopnea, dizziness, syncope, edema, or early satiety.  All other systems reviewed and are otherwise negative except as noted above.  Physical Exam    VS:  BP (!) 120/58   Pulse 72   Ht 4\' 9"  (1.448 m)   Wt 109 lb (49.4 kg)   BMI 23.59 kg/m  , BMI Body mass index is 23.59 kg/m.  Oxygen saturation with ambulation while using supplement oxygen at 2 L/m-94-97%. Oxygen saturation at rest without supplement oxygen 79-82%. GEN: Well nourished, well developed, in no acute distress.  HEENT: right-sided facial droop.  Neck: Supple, no JVD, carotid bruits, or masses. Cardiac:  RRR,  2/6 systolic murmur heard throughout, no rubs, or gallops. No clubbing, cyanosis, edema.  Radials/DP/PT 2+ and equal bilaterally.  Respiratory:  Respirations regular and unlabored, clear to auscultation bilaterally. GI: Soft, nontender, nondistended, BS + x 4. MS: no deformity or atrophy. Skin: warm and dry, no rash. Neuro:  Strength and sensation are intact. right-sided facial droop.  Psych: Normal affect.  Accessory Clinical Findings    Lab Results  Component Value Date   WBC 6.4 06/16/2016   HGB 10.8 (L) 06/16/2016   HCT 34.1 (L) 06/16/2016   MCV 92.0 06/16/2016   PLT 208.0 06/16/2016   Lab Results  Component Value Date   CREATININE 3.68 (H) 06/16/2016   BUN 24 (H) 06/16/2016   NA 142 06/16/2016   K 3.4 (L) 06/16/2016   CL 96 06/16/2016   CO2 34 (H) 06/16/2016    Assessment & Plan    1.  Dyspnea on exertion: Patient presents for evaluation of a several month history of dyspnea on exertion. She is not using supplemental oxygen with activity but has not been wearing it at rest. Though she saturates normally with ambulation while wearing oxygen, she does desaturate at rest when she is not using it. We have recommended that she use it around the clock. Dyspnea is likely multifactorial in the setting of anemia and severe coronary disease. Symptoms seem to have pretty improved with lowering of her dry weight by 1 kg at dialysis.  Previously LV function was normal by echo in March 2017. I will arrange for repeat echocardiogram. She is euvolemic on exam today.  2. Coronary artery disease: Patient with severe multivessel coronary artery disease by catheterization in January 2017. She is not felt to be a candidate for either percutaneous intervention or bypass surgery. She remains on medical therapy including aspirin, statin, beta blocker, and calcium channel blocker therapy. Pressure is normal today and she says it tends to run low in dialysis. In the setting of dyspnea on exertion,  which may represent an anginal equivalent, we could consider adding long-acting nitrates if pressure would tolerate. Echo pending.   3. Essential hypertension: Blood pressure stable today.  4. Hyperlipidemia: LDL was 42 on 06/19/2015. She remains on high potency statin therapy.   5. End-stage renal disease: She remains on Monday, Wednesday, Friday dialysis in [place].  6. Iron deficiency anemia: Patient is scheduled for follow-up EGD tomorrow and remains on PPI therapy.   7. Disposition: Follow-up echocardiogram. Follow-up with Dr. Stanford Breed as planned in June or sooner if necessary.   Murray Hodgkins, NP 06/30/2016, 12:45 PM

## 2016-06-30 NOTE — Patient Instructions (Signed)
Medication Instructions:   NO CHANGE  Testing/Procedures:  Your physician has requested that you have an echocardiogram. Echocardiography is a painless test that uses sound waves to create images of your heart. It provides your doctor with information about the size and shape of your heart and how well your heart's chambers and valves are working. This procedure takes approximately one hour. There are no restrictions for this procedure.    Follow-Up:  Your physician recommends that you schedule a follow-up appointment AS SCHEDULED IN [place]  If you need a refill on your cardiac medications before your next appointment, please call your pharmacy.

## 2016-07-01 ENCOUNTER — Ambulatory Visit (HOSPITAL_COMMUNITY): Payer: Medicare Other | Admitting: Certified Registered Nurse Anesthetist

## 2016-07-01 ENCOUNTER — Encounter (HOSPITAL_COMMUNITY): Payer: Self-pay | Admitting: *Deleted

## 2016-07-01 ENCOUNTER — Encounter (HOSPITAL_COMMUNITY)
Admission: RE | Disposition: A | Discharge status: Home / Self Care | Payer: Self-pay | Source: Ambulatory Visit | Attending: Gastroenterology

## 2016-07-01 ENCOUNTER — Ambulatory Visit (HOSPITAL_COMMUNITY)
Admission: RE | Admit: 2016-07-01 | Discharge: 2016-07-01 | Disposition: A | Discharge status: Home / Self Care | Payer: Medicare Other | Source: Ambulatory Visit | Attending: Gastroenterology | Admitting: Gastroenterology

## 2016-07-01 DIAGNOSIS — Z9981 Dependence on supplemental oxygen: Secondary | ICD-10-CM | POA: Insufficient documentation

## 2016-07-01 DIAGNOSIS — Z87891 Personal history of nicotine dependence: Secondary | ICD-10-CM | POA: Diagnosis not present

## 2016-07-01 DIAGNOSIS — N186 End stage renal disease: Secondary | ICD-10-CM | POA: Diagnosis not present

## 2016-07-01 DIAGNOSIS — K449 Diaphragmatic hernia without obstruction or gangrene: Secondary | ICD-10-CM | POA: Diagnosis not present

## 2016-07-01 DIAGNOSIS — M810 Age-related osteoporosis without current pathological fracture: Secondary | ICD-10-CM | POA: Insufficient documentation

## 2016-07-01 DIAGNOSIS — Z992 Dependence on renal dialysis: Secondary | ICD-10-CM | POA: Insufficient documentation

## 2016-07-01 DIAGNOSIS — I6523 Occlusion and stenosis of bilateral carotid arteries: Secondary | ICD-10-CM | POA: Insufficient documentation

## 2016-07-01 DIAGNOSIS — Z79899 Other long term (current) drug therapy: Secondary | ICD-10-CM | POA: Diagnosis not present

## 2016-07-01 DIAGNOSIS — K31811 Angiodysplasia of stomach and duodenum with bleeding: Secondary | ICD-10-CM

## 2016-07-01 DIAGNOSIS — I120 Hypertensive chronic kidney disease with stage 5 chronic kidney disease or end stage renal disease: Secondary | ICD-10-CM | POA: Insufficient documentation

## 2016-07-01 DIAGNOSIS — I2510 Atherosclerotic heart disease of native coronary artery without angina pectoris: Secondary | ICD-10-CM | POA: Diagnosis not present

## 2016-07-01 DIAGNOSIS — Z7982 Long term (current) use of aspirin: Secondary | ICD-10-CM | POA: Insufficient documentation

## 2016-07-01 DIAGNOSIS — K219 Gastro-esophageal reflux disease without esophagitis: Secondary | ICD-10-CM | POA: Insufficient documentation

## 2016-07-01 DIAGNOSIS — D500 Iron deficiency anemia secondary to blood loss (chronic): Secondary | ICD-10-CM | POA: Diagnosis not present

## 2016-07-01 DIAGNOSIS — E785 Hyperlipidemia, unspecified: Secondary | ICD-10-CM | POA: Diagnosis not present

## 2016-07-01 DIAGNOSIS — K922 Gastrointestinal hemorrhage, unspecified: Secondary | ICD-10-CM | POA: Diagnosis not present

## 2016-07-01 DIAGNOSIS — Z853 Personal history of malignant neoplasm of breast: Secondary | ICD-10-CM | POA: Insufficient documentation

## 2016-07-01 DIAGNOSIS — K31819 Angiodysplasia of stomach and duodenum without bleeding: Secondary | ICD-10-CM | POA: Diagnosis not present

## 2016-07-01 DIAGNOSIS — R112 Nausea with vomiting, unspecified: Secondary | ICD-10-CM | POA: Diagnosis not present

## 2016-07-01 HISTORY — PX: ESOPHAGOGASTRODUODENOSCOPY (EGD) WITH PROPOFOL: SHX5813

## 2016-07-01 HISTORY — PX: HOT HEMOSTASIS: SHX5433

## 2016-07-01 HISTORY — DX: Dependence on supplemental oxygen: Z99.81

## 2016-07-01 SURGERY — ESOPHAGOGASTRODUODENOSCOPY (EGD) WITH PROPOFOL
Anesthesia: Monitor Anesthesia Care

## 2016-07-01 MED ORDER — SODIUM CHLORIDE 0.9 % IV SOLN
INTRAVENOUS | Status: DC
  Administered 2016-07-01: 09:00:00 via INTRAVENOUS

## 2016-07-01 MED ORDER — EPHEDRINE SULFATE 50 MG/ML IJ SOLN
INTRAMUSCULAR | Status: DC | PRN
  Administered 2016-07-01 (×2): 10 mg via INTRAVENOUS

## 2016-07-01 MED ORDER — SUCRALFATE 1 G PO TABS: 1.0000 g | ORAL_TABLET | Freq: Three times a day (TID) | ORAL | 0 refills | Status: DC

## 2016-07-01 MED ORDER — PROPOFOL 500 MG/50ML IV EMUL
INTRAVENOUS | Status: DC | PRN
  Administered 2016-07-01: 150 ug/kg/min via INTRAVENOUS

## 2016-07-01 SURGICAL SUPPLY — 15 items

## 2016-07-01 NOTE — Transfer of Care (Signed)
Immediate Anesthesia Transfer of Care Note  Patient: [name]  Procedure(s) Performed: Procedure(s): ESOPHAGOGASTRODUODENOSCOPY (EGD) WITH PROPOFOL (N/A) HOT HEMOSTASIS (ARGON PLASMA COAGULATION/BICAP) (N/A)  Patient Location: PACU  Anesthesia Type:MAC  Level of Consciousness: sedated, patient cooperative and responds to stimulation  Airway & Oxygen Therapy: Patient Spontanous Breathing and Patient connected to face mask oxygen  Post-op Assessment: Report given to RN and Post -op Vital signs reviewed and stable  Post vital signs: Reviewed and stable  Last Vitals:  Vitals:   07/01/16 0820  BP: (!) 176/63  Resp: 19  Temp: 36.5 C    Last Pain:  Vitals:   07/01/16 0820  TempSrc: Oral         Complications: No apparent anesthesia complications

## 2016-07-01 NOTE — H&P (Signed)
[facility] History and Physical   Primary Care Physician:  [name], MD   Reason for Procedure:   Anemia due to suspected upper GI bleed in the setting of GAVE Plan:    EGD with APC    HPI: [name] is a 83 y.o. female with h/o ESRD on dialysis, h/o GAVE recurrent upper GI bleed here with worsening anemia and drop in Hgb to 9, concerning for recurrent upper GI bleed due to GAVE. ROS positive for intermittent dark stool, nausea and acid reflux.    Past Medical History:  Diagnosis Date  . Anemia in chronic kidney disease    IV iron infusion and anemia from GAVE blood loss.  . Arthritis     hands  . Bradycardia 2014   a. Noted 06/2012.  . Breast cancer ([place])    Left Breast 13 yrs. ago, RADIATION TX DONE  . CAD (coronary artery disease), native coronary artery    a. 03/2015 Cath: LAD 30m, D2 75, LCX 80, OM1 85, RI 99, RCA 70p/m, 95d, AM 85-->poor candidate for CABG & PCI-->Med Rx.  . Cardiac murmur    a. thought due to AVF (2D echo 06/2012 without significant valvular disease).   . Carotid arterial disease ([place])    a. 04/2015 Carotid U/S: 1-39% bilat ICA stenosis.  Marland Kitchen Ectropion of right lower eyelid 11/2012   s/p surgery (Dr. [name])  . End stage renal disease on dialysis [facility])    HD M,W,F [name] AVF now LUE AVF HD X 4 YRS   . Facial droop 1935   acquired during forceps delivery, some residual R visual loss  . Family history of adverse reaction to anesthesia    brother trouble waking up and confusion, now deceased  . GAVE (gastric antral vascular ectasia) 08/2014   s/p EGD with APC  . GERD (gastroesophageal reflux disease)   . Hearing loss    Bilateral hearing aids  . History of blood transfusion 5 weeks ago  . History of home oxygen therapy    2 LITERS  OXYGEN PRN WHEN WALKING  . Hx of breast cancer 2004  . Hyperlipidemia   . Hypertension   . Hypotension    a. Associated w/ dialysis.  . Moderate mitral regurgitation    a.  05/2015 Echo: EF 55-60%, no rwma, triv AI, Ao sclerosis, mod MR, mildly to mod dil LA, PASP 85mmHg.  . Osteoporosis 11/2013   DEXA -3.4 radius IN BACK  . Sight impaired    Left: wears contact. Right: diminished peripheral vision    Past Surgical History:  Procedure Laterality Date  . AV FISTULA PLACEMENT  07/20/10   Right brachiocephalic AVF  . BASCILIC VEIN TRANSPOSITION Left 07/24/2013   Procedure: BASILIC VEIN TRANSPOSITION;  Surgeon: [name], MD;  Location: [place];  Service: Vascular;  Laterality: Left;  . BREAST BIOPSY Left   . BREAST LUMPECTOMY Left   . CARDIAC CATHETERIZATION N/A 04/10/2015   Procedure: Left Heart Cath and Coronary Angiography;  Surgeon: [name], MD;  Location: [place] CV LAB;  Service: Cardiovascular;  Laterality: N/A;  . CATARACT EXTRACTION Bilateral 1980  . DEXA  11/2015   DEXA T -4.3 [facility]  . ESOPHAGOGASTRODUODENOSCOPY N/A 04/09/2014   Procedure: ESOPHAGOGASTRODUODENOSCOPY (EGD);  Surgeon: [name], MD;  Location: [place];  Service: Endoscopy;  Laterality: N/A;  . ESOPHAGOGASTRODUODENOSCOPY N/A 09/23/2014   Procedure: ESOPHAGOGASTRODUODENOSCOPY (EGD);  Surgeon: [name], MD;  Location: [place];  Service:  Endoscopy;  Laterality: N/A;  . ESOPHAGOGASTRODUODENOSCOPY N/A 12/26/2014   neg H pylori, benign biopsies; ESOPHAGOGASTRODUODENOSCOPY (EGD);  Surgeon: [name], MD  . ESOPHAGOGASTRODUODENOSCOPY N/A 06/15/2015   Procedure: ESOPHAGOGASTRODUODENOSCOPY (EGD);  Surgeon: Milus Banister, MD;  Location: [place];  Service: Endoscopy;  Laterality: N/A;  . ESOPHAGOGASTRODUODENOSCOPY N/A 06/27/2015   Procedure: ESOPHAGOGASTRODUODENOSCOPY (EGD);  Surgeon: Mauri Pole, MD;  Location: [facility];  Service: Endoscopy;  Laterality: N/A;  . ESOPHAGOGASTRODUODENOSCOPY (EGD) WITH PROPOFOL N/A 11/12/2014   Procedure: ESOPHAGOGASTRODUODENOSCOPY (EGD) WITH PROPOFOL;  Surgeon: [name], MD;  Location: [facility];   Service: Endoscopy;  Laterality: N/A;  . ESOPHAGOGASTRODUODENOSCOPY (EGD) WITH PROPOFOL N/A 07/31/2015   Procedure: ESOPHAGOGASTRODUODENOSCOPY (EGD) WITH PROPOFOL;  Surgeon: Mauri Pole, MD;  Location: [facility];  Service: Endoscopy;  Laterality: N/A;  . ESOPHAGOGASTRODUODENOSCOPY (EGD) WITH PROPOFOL N/A 03/08/2016   Procedure: ESOPHAGOGASTRODUODENOSCOPY (EGD) WITH PROPOFOL;  Surgeon: Mauri Pole, MD;  Location: [facility];  Service: Endoscopy;  Laterality: N/A;  . EYE SURGERY  1966   regular cataract  . HERNIA REPAIR    . HOT HEMOSTASIS N/A 11/12/2014   Procedure: HOT HEMOSTASIS (ARGON PLASMA COAGULATION/BICAP);  Surgeon: [name], MD;  Location: [facility];  Service: Endoscopy;  Laterality: N/A;  . HOT HEMOSTASIS N/A 06/27/2015   Procedure: HOT HEMOSTASIS (ARGON PLASMA COAGULATION/BICAP);  Surgeon: Mauri Pole, MD;  Location: [facility];  Service: Endoscopy;  Laterality: N/A;  . HOT HEMOSTASIS N/A 07/31/2015   Procedure: HOT HEMOSTASIS (ARGON PLASMA COAGULATION/BICAP);  Surgeon: Mauri Pole, MD;  Location: [facility];  Service: Endoscopy;  Laterality: N/A;  . PERIPHERAL VASCULAR CATHETERIZATION N/A 08/15/2014   Procedure: A/V Shuntogram/Fistulagram;  Surgeon: [name], MD;  Location: [place] [facility];  Service: Cardiovascular;  Laterality: N/A;  . PERIPHERAL VASCULAR CATHETERIZATION N/A 04/28/2015   Procedure: A/V Shuntogram/Fistulagram;  Surgeon: [name], MD;  Location: [place] [facility];  Service: Cardiovascular;  Laterality: N/A;  . PERIPHERAL VASCULAR CATHETERIZATION Left 04/28/2015   Procedure: A/V Shunt Intervention;  Surgeon: [name], MD;  Location: [place] [facility];  Service: Cardiovascular;  Laterality: Left;  . PERIPHERAL VASCULAR CATHETERIZATION Left 08/07/2015   Procedure: A/V Shuntogram/Fistulagram;  Surgeon: [name], MD;  Location: [place] [facility];  Service: Cardiovascular;  Laterality: Left;  . PERIPHERAL  VASCULAR CATHETERIZATION N/A 08/07/2015   Procedure: A/V Shunt Intervention;  Surgeon: [name], MD;  Location: [place] [facility];  Service: Cardiovascular;  Laterality: N/A;  . PERIPHERAL VASCULAR CATHETERIZATION Left 11/03/2015   Procedure: A/V Shuntogram/Fistulagram;  Surgeon: [name], MD;  Location: [place] [facility];  Service: Cardiovascular;  Laterality: Left;  . PERIPHERAL VASCULAR CATHETERIZATION N/A 11/03/2015   Procedure: A/V Shunt Intervention;  Surgeon: [name], MD;  Location: [place] [facility];  Service: Cardiovascular;  Laterality: N/A;  . PERIPHERAL VASCULAR CATHETERIZATION N/A 03/19/2016   Procedure: A/V Fistulagram;  Surgeon: Katha Cabal, MD;  Location: [place] [facility];  Service: Cardiovascular;  Laterality: N/A;  . PERIPHERAL VASCULAR CATHETERIZATION N/A 03/30/2016   Procedure: A/V Fistulagram;  Surgeon: Katha Cabal, MD;  Location: [place] [facility];  Service: Cardiovascular;  Laterality: N/A;  . PERIPHERAL VASCULAR CATHETERIZATION N/A 03/30/2016   Procedure: A/V Shunt Intervention;  Surgeon: Katha Cabal, MD;  Location: [place] [facility];  Service: Cardiovascular;  Laterality: N/A;  . SHUNTOGRAM N/A 04/16/2011   Procedure: [facility];  Surgeon: [name], MD;  Location: [facility] CATH  LAB;  Service: Cardiovascular;  Laterality: N/A;  . SHUNTOGRAM Right 07/05/2013   Procedure: FISTULOGRAM;  Surgeon: [name], MD;  Location: [facility] CATH LAB;  Service: Cardiovascular;  Laterality: Right;  . UMBILICAL HERNIA REPAIR  2011  . US ECHOCARDIOGRAPHY  06/2012   LVH, EF 65%, grade 1 diastol dysfunction, mod dliated LAD    Prior to Admission medications   Medication Sig Start Date End Date Taking? Authorizing Provider  acetaminophen (TYLENOL) 500 MG tablet Take 500 mg by mouth daily as needed for moderate pain.    Yes Historical Provider, MD  amLODipine (NORVASC) 5 MG tablet Take 1 tablet (5 mg total) by mouth at bedtime. 06/01/16  Yes Lelon Perla, MD  aspirin EC 81 MG tablet Take 1 tablet (81 mg total) by mouth daily. 03/06/15  Yes Lelon Perla, MD  atorvastatin (LIPITOR) 80 MG tablet Take 1 tablet (80 mg total) by mouth at bedtime. 06/01/16  Yes Lelon Perla, MD  B Complex-C-Folic Acid (RENA-VITE PO) Take 1 tablet by mouth daily with breakfast.    Yes Historical Provider, MD  Calcium Carbonate Antacid (TUMS ULTRA 1000 PO) Take 3,000 mg by mouth 3 (three) times daily after meals.    Yes Historical Provider, MD  cholecalciferol (VITAMIN D) 1000 UNITS tablet Take 1,000 Units by mouth every Monday, Wednesday, and Friday.    Yes Historical Provider, MD  metoprolol tartrate (LOPRESSOR) 25 MG tablet Take 1 tablet (25 mg total) by mouth 2 (two) times daily. Patient taking differently: Take 12.5 mg by mouth 2 (two) times daily. TAKE 1/2 TABLET (12.5 MG) IN THE MORNING AND 1/2 TABLET (12.5 MG) AT NIGHT. 06/01/16  Yes Lelon Perla, MD  ondansetron (ZOFRAN) 4 MG tablet Take 1 tablet (4 mg total) by mouth every 8 (eight) hours as needed for nausea or vomiting. 06/23/16  Yes [name], MD  pantoprazole (PROTONIX) 40 MG tablet TAKE 1 TABLET BY MOUTH TWICE A DAY Patient taking differently: take 80mg s in the morning 03/15/16  Yes Mauri Pole, MD  Polyethyl Glycol-Propyl Glycol (SYSTANE) 0.4-0.3 % GEL ophthalmic gel Place 1 application into the right eye every 6 (six) hours as needed (eye dryness or pain).   Yes Historical Provider, MD  traMADol (ULTRAM) 50 MG tablet Take 0.5 tablets (25 mg total) by mouth 2 (two) times daily as needed for moderate pain. 06/23/16  Yes [name], MD  ranitidine (ZANTAC) 150 MG tablet Take 1 tablet (150 mg total) by mouth at bedtime. 06/23/16   [name], MD    Current Facility-Administered Medications  Medication Dose Route Frequency Provider Last Rate Last Dose  . 0.9 %  sodium chloride infusion   Intravenous Continuous Mauri Pole, MD        Allergies as of 06/23/2016 -  Review Complete 06/23/2016  Allergen Reaction Noted  . Sulfa antibiotics Rash and Other (See Comments) 12/14/2010    Family History  Problem Relation Age of Onset  . Cancer Father     stomach  . CAD Father     MI at age 5  . Heart disease Father   . Heart attack Father   . Hypertension Mother   . Heart disease Brother     Congenital  . Hypertension Brother   . Diabetes Brother   . Cancer Sister     female (uterus?)    Social History   Social History  . Marital status: Single    Spouse name: N/A  . Number  of children: N/A  . Years of education: N/A   Occupational History  . 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      retired   Social History Main Topics  . Smoking status: Former Smoker    Years: 1.00    Types: Cigarettes    Quit date: 03/29/1953  . Smokeless tobacco: Never Used  . Alcohol use 0.0 oz/week     Comment: occasional  . Drug use: No  . Sexual activity: No   Other Topics Concern  . Not on file   Social History Narrative   As of 03/2013. Lives alone with her small dog.  Bother and sister in law died last year.     Occupation: retired, was Information systems manager for [employer]   Edu: some college    Review of Systems:  All other review of systems negative except as mentioned in the HPI.  Physical Exam: Vital signs in last 24 hours: Temp:  [97.7 F (36.5 C)] 97.7 F (36.5 C) (04/05 0820) Resp:  [19] 19 (04/05 0820) BP: (176)/(63) 176/63 (04/05 0820) SpO2:  [100 %] 100 % (04/05 0820)   General:   Alert,  Well-developed, well-nourished, pleasant and cooperative in NAD, bell's palsy, blind R eye Lungs:  Clear throughout to auscultation.   Heart:  Regular rate and rhythm; no murmurs, clicks, rubs,  or gallops. Abdomen:  Soft, nontender and nondistended. Normal bowel sounds.   Neuro/Psych:  Alert and cooperative. Normal mood and affect. A and O x 3   @K .Denzil Magnuson, MD [phone] Mon-Fri 8a-5p [phone] after 5p,  weekends, holidays 07/01/2016 8:46 AM@

## 2016-07-01 NOTE — Discharge Instructions (Signed)
YOU HAD AN ENDOSCOPIC PROCEDURE TODAY: Refer to the procedure report and other information in the discharge instructions given to you for any specific questions about what was found during the examination. If this information does not answer your questions, please call [place] office at [phone] to clarify.  ° °YOU SHOULD EXPECT: Some feelings of bloating in the abdomen. Passage of more gas than usual. Walking can help get rid of the air that was put into your GI tract during the procedure and reduce the bloating. If you had a lower endoscopy (such as a colonoscopy or flexible sigmoidoscopy) you may notice spotting of blood in your stool or on the toilet paper. Some abdominal soreness may be present for a day or two, also. ° °DIET: Your first meal following the procedure should be a light meal and then it is ok to progress to your normal diet. A half-sandwich or bowl of soup is an example of a good first meal. Heavy or fried foods are harder to digest and may make you feel nauseous or bloated. Drink plenty of fluids but you should avoid alcoholic beverages for 24 hours. If you had a esophageal dilation, please see attached instructions for diet.   ° °ACTIVITY: Your care partner should take you home directly after the procedure. You should plan to take it easy, moving slowly for the rest of the day. You can resume normal activity the day after the procedure however YOU SHOULD NOT DRIVE, use power tools, machinery or perform tasks that involve climbing or major physical exertion for 24 hours (because of the sedation medicines used during the test).  ° °SYMPTOMS TO REPORT IMMEDIATELY: °A gastroenterologist can be reached at any hour. Please call [phone]  for any of the following symptoms:  °Following lower endoscopy (colonoscopy, flexible sigmoidoscopy) °Excessive amounts of blood in the stool  °Significant tenderness, worsening of abdominal pains  °Swelling of the abdomen that is new, acute  °Fever of 100° or  higher  °Following upper endoscopy (EGD, EUS, ERCP, esophageal dilation) °Vomiting of blood or coffee ground material  °New, significant abdominal pain  °New, significant chest pain or pain under the shoulder blades  °Painful or persistently difficult swallowing  °New shortness of breath  °Black, tarry-looking or red, bloody stools ° °FOLLOW UP:  °If any biopsies were taken you will be contacted by phone or by letter within the next 1-3 weeks. Call [phone]  if you have not heard about the biopsies in 3 weeks.  °Please also call with any specific questions about appointments or follow up tests. ° °

## 2016-07-01 NOTE — Op Note (Signed)
[facility] Patient Name: [name] Procedure Date: 07/01/2016 MRN: [id number] Attending MD: Mauri Pole , MD Date of Birth: [birth year] CSN: [id number] Age: 83 Admit Type: Outpatient Procedure:                Upper GI endoscopy Indications:              Recent gastrointestinal bleeding, Suspected upper                            gastrointestinal bleeding, Treatment of bleeding                            vascular abnormality in the stomach Providers:                Mauri Pole, MD, Carolynn Comment, RN, Alfonso Patten, Technician, Herbie Drape, CRNA Referring MD:              Medicines:                Monitored Anesthesia Care Complications:            No immediate complications. Estimated Blood Loss:     Estimated blood loss was minimal. Procedure:                Pre-Anesthesia Assessment:                           - Prior to the procedure, a History and Physical                            was performed, and patient medications and                            allergies were reviewed. The patient's tolerance of                            previous anesthesia was also reviewed. The risks                            and benefits of the procedure and the sedation                            options and risks were discussed with the patient.                            All questions were answered, and informed consent                            was obtained. Prior Anticoagulants: The patient has                            taken no previous anticoagulant or antiplatelet  agents. ASA Grade Assessment: III - A patient with                            severe systemic disease. After reviewing the risks                            and benefits, the patient was deemed in                            satisfactory condition to undergo the procedure.                           After obtaining informed consent, the endoscope was                             passed under direct vision. Throughout the                            procedure, the patient's blood pressure, pulse, and                            oxygen saturations were monitored continuously. The                            EG-[id number] ([id number]) scope was introduced through the                            mouth, and advanced to the second part of duodenum.                            The upper GI endoscopy was accomplished without                            difficulty. The patient tolerated the procedure                            well. Scope In: Scope Out: Findings:      The esophagus was normal.      The Z-line was regular and was found 35 cm from the incisors.      A small hiatal hernia was present.      Multiple 1-5 mm angioectasias with active bleeding noted were found in       the gastric antrum. Coagulation for hemostasis and treatment of       angioectasia to prevent bleeding using argon plasma was successful.      The examined duodenum was normal. Impression:               - Normal esophagus.                           - Z-line regular, 35 cm from the incisors.                           - Small hiatal hernia.                           -  Multiple bleeding angioectasias in the stomach.                            Treated with argon plasma coagulation (APC).                           - Normal examined duodenum.                           - No specimens collected. Moderate Sedation:      N/A- Per Anesthesia Care Recommendation:           - Patient has a contact number available for                            emergencies. The signs and symptoms of potential                            delayed complications were discussed with the                            patient. Return to normal activities tomorrow.                            Written discharge instructions were provided to the                            patient.                           - Clear liquid  diet today.                           - Continue present medications.                           - Continue Protonix twice daily, 30-45 mins before                            breakfast and dinner                           - Use sucralfate tablets 1 gram PO 15 mins before                            meals and at bedtime for 2 weeks.                           - No ibuprofen, naproxen, or other non-steroidal                            anti-inflammatory drugs.                           - Repeat upper endoscopy in 3-6 months for  retreatment.                           - Return to GI clinic PRN.                           -Monitor Hgb routinely                           -Continue Iron infusion Procedure Code(s):        --- Professional ---                           [phone], Esophagogastroduodenoscopy, flexible,                            transoral; with control of bleeding, any method Diagnosis Code(s):        --- Professional ---                           K44.9, Diaphragmatic hernia without obstruction or                            gangrene                           K31.811, Angiodysplasia of stomach and duodenum                            with bleeding                           K92.2, Gastrointestinal hemorrhage, unspecified CPT copyright 2016 American Medical Association. All rights reserved. The codes documented in this report are preliminary and upon coder review may  be revised to meet current compliance requirements. Mauri Pole, MD 07/01/2016 9:32:23 AM This report has been signed electronically. Number of Addenda: 0

## 2016-07-01 NOTE — Anesthesia Postprocedure Evaluation (Addendum)
Anesthesia Post Note  Patient: [name]  Procedure(s) Performed: Procedure(s) (LRB): ESOPHAGOGASTRODUODENOSCOPY (EGD) WITH PROPOFOL (N/A) HOT HEMOSTASIS (ARGON PLASMA COAGULATION/BICAP) (N/A)  Patient location during evaluation: PACU Anesthesia Type: MAC Level of consciousness: awake Pain management: pain level controlled Vital Signs Assessment: post-procedure vital signs reviewed and stable Respiratory status: spontaneous breathing Cardiovascular status: stable Postop Assessment: no signs of nausea or vomiting Anesthetic complications: no        Last Vitals:  Vitals:   07/01/16 0940 07/01/16 0950  BP: (!) 160/55 (!) 142/69  Pulse:    Resp:    Temp:      Last Pain:  Vitals:   07/01/16 0922  TempSrc: Oral   Pain Goal:                 [name]

## 2016-07-01 NOTE — Anesthesia Preprocedure Evaluation (Signed)
Anesthesia Evaluation  Patient identified by MRN, date of birth, ID band Patient awake    Reviewed: Allergy & Precautions, NPO status , Patient's Chart, lab work & pertinent test results  Airway Mallampati: III  TM Distance: >3 FB Neck ROM: Full    Dental  (+) Edentulous Upper, Edentulous Lower   Pulmonary former smoker,    breath sounds clear to auscultation- rhonchi       Cardiovascular hypertension, Pt. on medications + CAD (Severe 3vessel CAD- high risk CABG candidate- medical management. Normal EF), + Past MI and +CHF   Rhythm:Regular Rate:Normal  05/2015: Normal LV size with mild LV hypertrophy. EF 55-60%. Normal RV   size and systolic function. Moderate mitral regurgitation. Mild   pulmonary hypertension.   Neuro/Psych Anxiety negative neurological ROS     GI/Hepatic Neg liver ROS, GERD  ,  Endo/Other  negative endocrine ROS  Renal/GU ESRF and DialysisRenal disease  negative genitourinary   Musculoskeletal  (+) Arthritis ,   Abdominal Normal abdominal exam  (+)   Peds  Hematology  (+) anemia ,   Anesthesia Other Findings Study Result   Result status: Final result                             *[facility]*                         [address], [place] [address]                            [phone]  ------------------------------------------------------------------- Transthoracic Echocardiography  Patient:    [name], [name] MR #:       [id number] Study Date: 06/14/2015 Gender:     F Age:        83 Height:     149.9 cm Weight:     53.3 kg BSA:        1.5 m^2 Pt. Status: Room:       [place], [place]  [place] MD  ADMITTING    Geradine Girt  PERFORMING   [facility], Inpatient  SONOGRAPHER  Roseanna Rainbow  cc:  ------------------------------------------------------------------- LV EF: 55% -   60%  ------------------------------------------------------------------- Indications:      Chest pain 786.51.  ------------------------------------------------------------------- History:   PMH:  Former smoker.  Coronary artery disease.  Primary pulmonary hypertension.  Risk factors:  Hypertension. Diabetes mellitus. Dyslipidemia.  ------------------------------------------------------------------- Study Conclusions  - Left ventricle: The cavity size was normal. Wall thickness was   increased in a pattern of mild LVH. Systolic function was normal.   The estimated ejection fraction was in the range of 55% to 60%.   Wall motion was normal; there were no regional wall motion   abnormalities. - Aortic valve: Trileaflet; moderately calcified leaflets.   Sclerosis without stenosis. There was trivial regurgitation. - Mitral valve: Mildly calcified annulus. Mildly calcified leaflets   . There was moderate regurgitation. - Left atrium: The atrium was mildly to moderately dilated. - Right ventricle: The cavity size was normal. Systolic function  was normal.    Reproductive/Obstetrics                             Lab Results  Component Value Date   CREATININE 3.68 (H) 06/16/2016   BUN 24 (H) 06/16/2016   NA 142 06/16/2016   K 3.4 (L) 06/16/2016   CL 96 06/16/2016   CO2 34 (H) 06/16/2016   Lab Results  Component Value Date   WBC 6.4 06/16/2016   HGB 10.8 (L) 06/16/2016   HCT 34.1 (L) 06/16/2016   MCV 92.0 06/16/2016   PLT 208.0 06/16/2016    Anesthesia Physical  Anesthesia Plan  ASA: IV  Anesthesia Plan: MAC   Post-op Pain Management:    Induction: Intravenous  Airway Management Planned: Natural Airway, Nasal Cannula and Simple Face Mask  Additional Equipment:   Intra-op Plan:   Post-operative Plan:   Informed Consent: I have reviewed  the patients History and Physical, chart, labs and discussed the procedure including the risks, benefits and alternatives for the proposed anesthesia with the patient or authorized representative who has indicated his/her understanding and acceptance.     Plan Discussed with: CRNA  Anesthesia Plan Comments: (Study Result   Result status: Final result                             *[facility]*                         [address], [place] [address]                            [phone]  ------------------------------------------------------------------- Transthoracic Echocardiography  Patient:    [name], [name] MR #:       [id number] Study Date: 06/14/2015 Gender:     F Age:        69 Height:     149.9 cm Weight:     53.3 kg BSA:        1.5 m^2 Pt. Status: Room:       [place], [place]  [place] MD  ADMITTING    Geradine Girt  PERFORMING   [facility], Inpatient  SONOGRAPHER  Roseanna Rainbow  cc:  ------------------------------------------------------------------- LV EF: 55% -   60%  ------------------------------------------------------------------- Indications:      Chest pain 786.51.  ------------------------------------------------------------------- History:   PMH:  Former smoker.  Coronary artery disease.  Primary pulmonary hypertension.  Risk factors:  Hypertension. Diabetes mellitus. Dyslipidemia.  ------------------------------------------------------------------- Study Conclusions  - Left ventricle: The cavity size was normal. Wall thickness was   increased in a pattern of mild LVH. Systolic function was normal.   The estimated ejection fraction was in the range of 55% to 60%.   Wall motion was normal; there were no regional wall motion   abnormalities. - Aortic valve: Trileaflet; moderately calcified leaflets.   Sclerosis  without stenosis. There was trivial regurgitation. - Mitral valve: Mildly calcified annulus. Mildly calcified leaflets   . There was moderate  regurgitation. - Left atrium: The atrium was mildly to moderately dilated. - Right ventricle: The cavity size was normal. Systolic function   was normal.  )        Anesthesia Quick Evaluation

## 2016-07-02 DIAGNOSIS — E782 Mixed hyperlipidemia: Secondary | ICD-10-CM | POA: Diagnosis not present

## 2016-07-02 DIAGNOSIS — N186 End stage renal disease: Secondary | ICD-10-CM | POA: Diagnosis not present

## 2016-07-02 DIAGNOSIS — E1129 Type 2 diabetes mellitus with other diabetic kidney complication: Secondary | ICD-10-CM | POA: Diagnosis not present

## 2016-07-02 DIAGNOSIS — N2581 Secondary hyperparathyroidism of renal origin: Secondary | ICD-10-CM | POA: Diagnosis not present

## 2016-07-02 DIAGNOSIS — D631 Anemia in chronic kidney disease: Secondary | ICD-10-CM | POA: Diagnosis not present

## 2016-07-05 DIAGNOSIS — E782 Mixed hyperlipidemia: Secondary | ICD-10-CM | POA: Diagnosis not present

## 2016-07-05 DIAGNOSIS — N186 End stage renal disease: Secondary | ICD-10-CM | POA: Diagnosis not present

## 2016-07-05 DIAGNOSIS — D631 Anemia in chronic kidney disease: Secondary | ICD-10-CM | POA: Diagnosis not present

## 2016-07-05 DIAGNOSIS — N2581 Secondary hyperparathyroidism of renal origin: Secondary | ICD-10-CM | POA: Diagnosis not present

## 2016-07-05 DIAGNOSIS — E1129 Type 2 diabetes mellitus with other diabetic kidney complication: Secondary | ICD-10-CM | POA: Diagnosis not present

## 2016-07-06 ENCOUNTER — Telehealth: Payer: Self-pay | Admitting: *Deleted

## 2016-07-06 ENCOUNTER — Ambulatory Visit: Payer: Medicare Other | Admitting: Gastroenterology

## 2016-07-06 NOTE — Telephone Encounter (Signed)
Reschedule pt for 1 month unless she needs to be seen now

## 2016-07-06 NOTE — Telephone Encounter (Signed)
Patient aware of new appointment scheduled for 08/12/2016 at 10:30am

## 2016-07-07 DIAGNOSIS — N186 End stage renal disease: Secondary | ICD-10-CM | POA: Diagnosis not present

## 2016-07-07 DIAGNOSIS — D631 Anemia in chronic kidney disease: Secondary | ICD-10-CM | POA: Diagnosis not present

## 2016-07-07 DIAGNOSIS — E1129 Type 2 diabetes mellitus with other diabetic kidney complication: Secondary | ICD-10-CM | POA: Diagnosis not present

## 2016-07-07 DIAGNOSIS — E782 Mixed hyperlipidemia: Secondary | ICD-10-CM | POA: Diagnosis not present

## 2016-07-07 DIAGNOSIS — N2581 Secondary hyperparathyroidism of renal origin: Secondary | ICD-10-CM | POA: Diagnosis not present

## 2016-07-08 ENCOUNTER — Ambulatory Visit: Payer: Medicare Other | Admitting: Gastroenterology

## 2016-07-09 DIAGNOSIS — N2581 Secondary hyperparathyroidism of renal origin: Secondary | ICD-10-CM | POA: Diagnosis not present

## 2016-07-09 DIAGNOSIS — E782 Mixed hyperlipidemia: Secondary | ICD-10-CM | POA: Diagnosis not present

## 2016-07-09 DIAGNOSIS — N186 End stage renal disease: Secondary | ICD-10-CM | POA: Diagnosis not present

## 2016-07-09 DIAGNOSIS — E1129 Type 2 diabetes mellitus with other diabetic kidney complication: Secondary | ICD-10-CM | POA: Diagnosis not present

## 2016-07-09 DIAGNOSIS — D631 Anemia in chronic kidney disease: Secondary | ICD-10-CM | POA: Diagnosis not present

## 2016-07-12 DIAGNOSIS — D631 Anemia in chronic kidney disease: Secondary | ICD-10-CM | POA: Diagnosis not present

## 2016-07-12 DIAGNOSIS — E1129 Type 2 diabetes mellitus with other diabetic kidney complication: Secondary | ICD-10-CM | POA: Diagnosis not present

## 2016-07-12 DIAGNOSIS — E782 Mixed hyperlipidemia: Secondary | ICD-10-CM | POA: Diagnosis not present

## 2016-07-12 DIAGNOSIS — N186 End stage renal disease: Secondary | ICD-10-CM | POA: Diagnosis not present

## 2016-07-12 DIAGNOSIS — N2581 Secondary hyperparathyroidism of renal origin: Secondary | ICD-10-CM | POA: Diagnosis not present

## 2016-07-14 DIAGNOSIS — E782 Mixed hyperlipidemia: Secondary | ICD-10-CM | POA: Diagnosis not present

## 2016-07-14 DIAGNOSIS — D631 Anemia in chronic kidney disease: Secondary | ICD-10-CM | POA: Diagnosis not present

## 2016-07-14 DIAGNOSIS — E1129 Type 2 diabetes mellitus with other diabetic kidney complication: Secondary | ICD-10-CM | POA: Diagnosis not present

## 2016-07-14 DIAGNOSIS — N2581 Secondary hyperparathyroidism of renal origin: Secondary | ICD-10-CM | POA: Diagnosis not present

## 2016-07-14 DIAGNOSIS — N186 End stage renal disease: Secondary | ICD-10-CM | POA: Diagnosis not present

## 2016-07-15 ENCOUNTER — Other Ambulatory Visit: Payer: Self-pay

## 2016-07-15 ENCOUNTER — Ambulatory Visit (HOSPITAL_COMMUNITY): Payer: Medicare Other | Attending: Cardiology

## 2016-07-15 DIAGNOSIS — I272 Pulmonary hypertension, unspecified: Secondary | ICD-10-CM | POA: Insufficient documentation

## 2016-07-15 DIAGNOSIS — R0609 Other forms of dyspnea: Secondary | ICD-10-CM | POA: Diagnosis not present

## 2016-07-15 DIAGNOSIS — I082 Rheumatic disorders of both aortic and tricuspid valves: Secondary | ICD-10-CM | POA: Insufficient documentation

## 2016-07-16 DIAGNOSIS — N2581 Secondary hyperparathyroidism of renal origin: Secondary | ICD-10-CM | POA: Diagnosis not present

## 2016-07-16 DIAGNOSIS — N186 End stage renal disease: Secondary | ICD-10-CM | POA: Diagnosis not present

## 2016-07-16 DIAGNOSIS — D631 Anemia in chronic kidney disease: Secondary | ICD-10-CM | POA: Diagnosis not present

## 2016-07-16 DIAGNOSIS — E782 Mixed hyperlipidemia: Secondary | ICD-10-CM | POA: Diagnosis not present

## 2016-07-16 DIAGNOSIS — E1129 Type 2 diabetes mellitus with other diabetic kidney complication: Secondary | ICD-10-CM | POA: Diagnosis not present

## 2016-07-19 DIAGNOSIS — N2581 Secondary hyperparathyroidism of renal origin: Secondary | ICD-10-CM | POA: Diagnosis not present

## 2016-07-19 DIAGNOSIS — E782 Mixed hyperlipidemia: Secondary | ICD-10-CM | POA: Diagnosis not present

## 2016-07-19 DIAGNOSIS — E1129 Type 2 diabetes mellitus with other diabetic kidney complication: Secondary | ICD-10-CM | POA: Diagnosis not present

## 2016-07-19 DIAGNOSIS — D631 Anemia in chronic kidney disease: Secondary | ICD-10-CM | POA: Diagnosis not present

## 2016-07-19 DIAGNOSIS — N186 End stage renal disease: Secondary | ICD-10-CM | POA: Diagnosis not present

## 2016-07-21 DIAGNOSIS — N2581 Secondary hyperparathyroidism of renal origin: Secondary | ICD-10-CM | POA: Diagnosis not present

## 2016-07-21 DIAGNOSIS — D631 Anemia in chronic kidney disease: Secondary | ICD-10-CM | POA: Diagnosis not present

## 2016-07-21 DIAGNOSIS — E1129 Type 2 diabetes mellitus with other diabetic kidney complication: Secondary | ICD-10-CM | POA: Diagnosis not present

## 2016-07-21 DIAGNOSIS — N186 End stage renal disease: Secondary | ICD-10-CM | POA: Diagnosis not present

## 2016-07-21 DIAGNOSIS — E782 Mixed hyperlipidemia: Secondary | ICD-10-CM | POA: Diagnosis not present

## 2016-07-23 DIAGNOSIS — D631 Anemia in chronic kidney disease: Secondary | ICD-10-CM | POA: Diagnosis not present

## 2016-07-23 DIAGNOSIS — N186 End stage renal disease: Secondary | ICD-10-CM | POA: Diagnosis not present

## 2016-07-23 DIAGNOSIS — E782 Mixed hyperlipidemia: Secondary | ICD-10-CM | POA: Diagnosis not present

## 2016-07-23 DIAGNOSIS — E1129 Type 2 diabetes mellitus with other diabetic kidney complication: Secondary | ICD-10-CM | POA: Diagnosis not present

## 2016-07-23 DIAGNOSIS — N2581 Secondary hyperparathyroidism of renal origin: Secondary | ICD-10-CM | POA: Diagnosis not present

## 2016-07-26 DIAGNOSIS — Z992 Dependence on renal dialysis: Secondary | ICD-10-CM | POA: Diagnosis not present

## 2016-07-26 DIAGNOSIS — N186 End stage renal disease: Secondary | ICD-10-CM | POA: Diagnosis not present

## 2016-07-26 DIAGNOSIS — D631 Anemia in chronic kidney disease: Secondary | ICD-10-CM | POA: Diagnosis not present

## 2016-07-26 DIAGNOSIS — E782 Mixed hyperlipidemia: Secondary | ICD-10-CM | POA: Diagnosis not present

## 2016-07-26 DIAGNOSIS — N2581 Secondary hyperparathyroidism of renal origin: Secondary | ICD-10-CM | POA: Diagnosis not present

## 2016-07-26 DIAGNOSIS — E1129 Type 2 diabetes mellitus with other diabetic kidney complication: Secondary | ICD-10-CM | POA: Diagnosis not present

## 2016-07-27 ENCOUNTER — Ambulatory Visit (INDEPENDENT_AMBULATORY_CARE_PROVIDER_SITE_OTHER): Payer: Medicare Other | Admitting: Family Medicine

## 2016-07-27 ENCOUNTER — Encounter: Payer: Self-pay | Admitting: Family Medicine

## 2016-07-27 VITALS — BP 118/64 | HR 68 | Temp 97.7°F | Wt 110.5 lb

## 2016-07-27 DIAGNOSIS — Z992 Dependence on renal dialysis: Secondary | ICD-10-CM

## 2016-07-27 DIAGNOSIS — J9611 Chronic respiratory failure with hypoxia: Secondary | ICD-10-CM | POA: Diagnosis not present

## 2016-07-27 DIAGNOSIS — R112 Nausea with vomiting, unspecified: Secondary | ICD-10-CM

## 2016-07-27 DIAGNOSIS — K31819 Angiodysplasia of stomach and duodenum without bleeding: Secondary | ICD-10-CM | POA: Diagnosis not present

## 2016-07-27 DIAGNOSIS — R197 Diarrhea, unspecified: Secondary | ICD-10-CM

## 2016-07-27 DIAGNOSIS — R0609 Other forms of dyspnea: Secondary | ICD-10-CM | POA: Diagnosis not present

## 2016-07-27 DIAGNOSIS — N186 End stage renal disease: Secondary | ICD-10-CM

## 2016-07-27 DIAGNOSIS — Z9981 Dependence on supplemental oxygen: Secondary | ICD-10-CM

## 2016-07-27 DIAGNOSIS — I272 Pulmonary hypertension, unspecified: Secondary | ICD-10-CM | POA: Diagnosis not present

## 2016-07-27 DIAGNOSIS — M545 Low back pain, unspecified: Secondary | ICD-10-CM

## 2016-07-27 DIAGNOSIS — D500 Iron deficiency anemia secondary to blood loss (chronic): Secondary | ICD-10-CM

## 2016-07-27 DIAGNOSIS — G8929 Other chronic pain: Secondary | ICD-10-CM | POA: Diagnosis not present

## 2016-07-27 DIAGNOSIS — I248 Other forms of acute ischemic heart disease: Secondary | ICD-10-CM

## 2016-07-27 MED ORDER — TRAMADOL HCL 50 MG PO TABS: 25.0000 mg | ORAL_TABLET | Freq: Two times a day (BID) | ORAL | 0 refills | Status: AC | PRN

## 2016-07-27 NOTE — Assessment & Plan Note (Signed)
This has resolved with transfusion and s/p EGD with APC treatment to angiodysplasias

## 2016-07-27 NOTE — Assessment & Plan Note (Signed)
-   Continue supplemental O2 

## 2016-07-27 NOTE — Assessment & Plan Note (Signed)
Refilled tramadol. Tolerating well, helpful.

## 2016-07-27 NOTE — Assessment & Plan Note (Signed)
Worsened on recent echo 06/2016. Discussed oxygen use.

## 2016-07-27 NOTE — Assessment & Plan Note (Signed)
Continue daily PPI and carafate TID x 2 wks. Appreciate GI care.

## 2016-07-27 NOTE — Patient Instructions (Addendum)
Tramadol refilled. I'm glad you're doing better. We will continue to closely monitor blood counts. Return as needed or in 3 months for folloow up visit.

## 2016-07-27 NOTE — Progress Notes (Signed)
Pre visit review using our clinic review tool, if applicable. No additional management support is needed unless otherwise documented below in the visit note. 

## 2016-07-27 NOTE — Assessment & Plan Note (Addendum)
Improved with lower dry weight.

## 2016-07-27 NOTE — Assessment & Plan Note (Addendum)
s/p recent EGD and APC treatment

## 2016-07-27 NOTE — Progress Notes (Signed)
BP 118/64   Pulse 68   Temp 97.7 F (36.5 C) (Oral)   Wt 110 lb 8 oz (50.1 kg)   SpO2 95% Comment: 2LNC  BMI 23.91 kg/m    CC: 66mo f/u visit Subjective:    Patient ID: [name], female    DOB: Aug 13, 1931, 83 y.o.   MRN: [id number]  HPI: [name] is a 83 y.o. female presenting on 07/27/2016 for Follow-up (on test)   See recent note for details. Since last seen here, saw GI and cards. Notes reviewed. Symptoms have largely improved with lowering dry weight. Updated echocardiogram showed normal LV function with worsened pulm hypertension, rec more regular use of oxygen. EGD showed multiple bleeding angioectasias in the stomach treated with argon plasma coagulation (APC). rec continue PPI, add carafate, routine anemia monitoring, and f/u 3-6 mo PRN.   She tries to limit her supplemental oxygen use.  She is carrying oxygen tank with her through [place]. Interested in smaller tank or portable oxygen concentrator. She will look into this and let [name] know.   Latest Hgb at HD 10.6. She is feeling well. Denies nausea, abd discomfort.  Requests tramadol Rx refill - takes 1/2 tab daily. Last received #20 late 05/2016.   Relevant past medical, surgical, family and social history reviewed and updated as indicated. Interim medical history since our last visit reviewed. Allergies and medications reviewed and updated. Outpatient Medications Prior to Visit  Medication Sig Dispense Refill  . acetaminophen (TYLENOL) 500 MG tablet Take 500 mg by mouth daily as needed for moderate pain.     Marland Kitchen amLODipine (NORVASC) 5 MG tablet Take 1 tablet (5 mg total) by mouth at bedtime. 90 tablet 3  . aspirin EC 81 MG tablet Take 1 tablet (81 mg total) by mouth daily. 90 tablet 3  . atorvastatin (LIPITOR) 80 MG tablet Take 1 tablet (80 mg total) by mouth at bedtime. 90 tablet 3  . B Complex-C-Folic Acid (RENA-VITE PO) Take 1 tablet by mouth daily with breakfast.     . Calcium Carbonate Antacid  (TUMS ULTRA 1000 PO) Take 3,000 mg by mouth 3 (three) times daily after meals.     . cholecalciferol (VITAMIN D) 1000 UNITS tablet Take 1,000 Units by mouth every Monday, Wednesday, and Friday.     . metoprolol tartrate (LOPRESSOR) 25 MG tablet Take 1 tablet (25 mg total) by mouth 2 (two) times daily. (Patient taking differently: Take 12.5 mg by mouth 2 (two) times daily. TAKE 1/2 TABLET (12.5 MG) IN THE MORNING AND 1/2 TABLET (12.5 MG) AT NIGHT.) 180 tablet 3  . ondansetron (ZOFRAN) 4 MG tablet Take 1 tablet (4 mg total) by mouth every 8 (eight) hours as needed for nausea or vomiting. 20 tablet 0  . pantoprazole (PROTONIX) 40 MG tablet TAKE 1 TABLET BY MOUTH TWICE A DAY (Patient taking differently: take 80mg s in the morning) 60 tablet 5  . Polyethyl Glycol-Propyl Glycol (SYSTANE) 0.4-0.3 % GEL ophthalmic gel Place 1 application into the right eye every 6 (six) hours as needed (eye dryness or pain).    . sucralfate (CARAFATE) 1 g tablet Take 1 tablet (1 g total) by mouth 4 (four) times daily -  with meals and at bedtime. 60 tablet 0  . traMADol (ULTRAM) 50 MG tablet Take 0.5 tablets (25 mg total) by mouth 2 (two) times daily as needed for moderate pain. 20 tablet 0  . ranitidine (ZANTAC) 150 MG tablet Take 1 tablet (150 mg  total) by mouth at bedtime. (Patient not taking: Reported on 07/27/2016)     No facility-administered medications prior to visit.      Per HPI unless specifically indicated in ROS section below Review of Systems     Objective:    BP 118/64   Pulse 68   Temp 97.7 F (36.5 C) (Oral)   Wt 110 lb 8 oz (50.1 kg)   SpO2 95% Comment: 2LNC  BMI 23.91 kg/m   Wt Readings from Last 3 Encounters:  07/27/16 110 lb 8 oz (50.1 kg)  06/30/16 109 lb (49.4 kg)  06/23/16 110 lb (49.9 kg)    Physical Exam  Constitutional: She appears well-developed and well-nourished. No distress.  HENT:  Mouth/Throat: Oropharynx is clear and moist. No oropharyngeal exudate.  Chronic R facial droop    Cardiovascular: Normal rate, regular rhythm and intact distal pulses.   Murmur (2/6 systolic flow murmur upper sternal border) heard. Pulmonary/Chest: Effort normal and breath sounds normal. No respiratory distress. She has no wheezes. She has no rales.  coarse  Musculoskeletal: She exhibits edema (tr).  Skin: Skin is warm and dry. No rash noted.  Nursing note and vitals reviewed.  Results for orders placed or performed in visit on 06/16/16  Brain natriuretic peptide  Result Value Ref Range   Pro B Natriuretic peptide (BNP) 2,034.0 (H) 0.0 - 100.0 pg/mL  Basic metabolic panel  Result Value Ref Range   Sodium 142 135 - 145 mEq/L   Potassium 3.4 (L) 3.5 - 5.1 mEq/L   Chloride 96 96 - 112 mEq/L   CO2 34 (H) 19 - 32 mEq/L   Glucose, Bld 122 (H) 70 - 99 mg/dL   BUN 24 (H) 6 - 23 mg/dL   Creatinine, Ser 3.68 (H) 0.40 - 1.20 mg/dL   Calcium 8.7 8.4 - 10.5 mg/dL   GFR 12.53 (LL) >60.00 mL/min  Troponin I  Result Value Ref Range   TNIDX 0.04 0.00 - 0.06 ug/l  CBC  Result Value Ref Range   WBC 6.4 4.0 - 10.5 K/uL   RBC 3.70 (L) 3.87 - 5.11 Mil/uL   Platelets 208.0 150.0 - 400.0 K/uL   Hemoglobin 10.8 (L) 12.0 - 15.0 g/dL   HCT 34.1 (L) 36.0 - 46.0 %   MCV 92.0 78.0 - 100.0 fl   MCHC 31.6 30.0 - 36.0 g/dL   RDW 19.8 (H) 11.5 - 15.5 %      Assessment & Plan:   Problem List Items Addressed This Visit    Chronic midline low back pain without sciatica    Refilled tramadol. Tolerating well, helpful.       Relevant Medications   traMADol (ULTRAM) 50 MG tablet   Chronic respiratory failure with hypoxia, on home O2 therapy (HCC)    Continue supplemental O2.       ESRD on dialysis [facility])   Exertional dyspnea    Improved with lower dry weight.      GAVE (gastric antral vascular ectasia)    Continue daily PPI and carafate TID x 2 wks. Appreciate GI care.      Iron deficiency anemia due to chronic blood loss    s/p recent EGD and APC treatment       Moderate to severe  pulmonary hypertension ([place]) - Primary    Worsened on recent echo 06/2016. Discussed oxygen use.       RESOLVED: Nausea vomiting and diarrhea    This has resolved with transfusion and s/p EGD with APC  treatment to angiodysplasias          Follow up plan: Return in about 3 months (around 10/27/2016) for follow up visit.  [name], MD

## 2016-07-28 DIAGNOSIS — D631 Anemia in chronic kidney disease: Secondary | ICD-10-CM | POA: Diagnosis not present

## 2016-07-28 DIAGNOSIS — N186 End stage renal disease: Secondary | ICD-10-CM | POA: Diagnosis not present

## 2016-07-28 DIAGNOSIS — E782 Mixed hyperlipidemia: Secondary | ICD-10-CM | POA: Diagnosis not present

## 2016-07-28 DIAGNOSIS — E1129 Type 2 diabetes mellitus with other diabetic kidney complication: Secondary | ICD-10-CM | POA: Diagnosis not present

## 2016-07-28 DIAGNOSIS — N2581 Secondary hyperparathyroidism of renal origin: Secondary | ICD-10-CM | POA: Diagnosis not present

## 2016-07-30 DIAGNOSIS — N2581 Secondary hyperparathyroidism of renal origin: Secondary | ICD-10-CM | POA: Diagnosis not present

## 2016-07-30 DIAGNOSIS — E782 Mixed hyperlipidemia: Secondary | ICD-10-CM | POA: Diagnosis not present

## 2016-07-30 DIAGNOSIS — N186 End stage renal disease: Secondary | ICD-10-CM | POA: Diagnosis not present

## 2016-07-30 DIAGNOSIS — E1129 Type 2 diabetes mellitus with other diabetic kidney complication: Secondary | ICD-10-CM | POA: Diagnosis not present

## 2016-07-30 DIAGNOSIS — D631 Anemia in chronic kidney disease: Secondary | ICD-10-CM | POA: Diagnosis not present

## 2016-08-02 DIAGNOSIS — N186 End stage renal disease: Secondary | ICD-10-CM | POA: Diagnosis not present

## 2016-08-02 DIAGNOSIS — E1129 Type 2 diabetes mellitus with other diabetic kidney complication: Secondary | ICD-10-CM | POA: Diagnosis not present

## 2016-08-02 DIAGNOSIS — E782 Mixed hyperlipidemia: Secondary | ICD-10-CM | POA: Diagnosis not present

## 2016-08-02 DIAGNOSIS — N2581 Secondary hyperparathyroidism of renal origin: Secondary | ICD-10-CM | POA: Diagnosis not present

## 2016-08-02 DIAGNOSIS — D631 Anemia in chronic kidney disease: Secondary | ICD-10-CM | POA: Diagnosis not present

## 2016-08-04 DIAGNOSIS — E782 Mixed hyperlipidemia: Secondary | ICD-10-CM | POA: Diagnosis not present

## 2016-08-04 DIAGNOSIS — N2581 Secondary hyperparathyroidism of renal origin: Secondary | ICD-10-CM | POA: Diagnosis not present

## 2016-08-04 DIAGNOSIS — D631 Anemia in chronic kidney disease: Secondary | ICD-10-CM | POA: Diagnosis not present

## 2016-08-04 DIAGNOSIS — N186 End stage renal disease: Secondary | ICD-10-CM | POA: Diagnosis not present

## 2016-08-04 DIAGNOSIS — E1129 Type 2 diabetes mellitus with other diabetic kidney complication: Secondary | ICD-10-CM | POA: Diagnosis not present

## 2016-08-06 DIAGNOSIS — N2581 Secondary hyperparathyroidism of renal origin: Secondary | ICD-10-CM | POA: Diagnosis not present

## 2016-08-06 DIAGNOSIS — E1129 Type 2 diabetes mellitus with other diabetic kidney complication: Secondary | ICD-10-CM | POA: Diagnosis not present

## 2016-08-06 DIAGNOSIS — E782 Mixed hyperlipidemia: Secondary | ICD-10-CM | POA: Diagnosis not present

## 2016-08-06 DIAGNOSIS — D631 Anemia in chronic kidney disease: Secondary | ICD-10-CM | POA: Diagnosis not present

## 2016-08-06 DIAGNOSIS — N186 End stage renal disease: Secondary | ICD-10-CM | POA: Diagnosis not present

## 2016-08-09 DIAGNOSIS — N186 End stage renal disease: Secondary | ICD-10-CM | POA: Diagnosis not present

## 2016-08-09 DIAGNOSIS — D631 Anemia in chronic kidney disease: Secondary | ICD-10-CM | POA: Diagnosis not present

## 2016-08-09 DIAGNOSIS — N2581 Secondary hyperparathyroidism of renal origin: Secondary | ICD-10-CM | POA: Diagnosis not present

## 2016-08-09 DIAGNOSIS — E782 Mixed hyperlipidemia: Secondary | ICD-10-CM | POA: Diagnosis not present

## 2016-08-09 DIAGNOSIS — E1129 Type 2 diabetes mellitus with other diabetic kidney complication: Secondary | ICD-10-CM | POA: Diagnosis not present

## 2016-08-11 DIAGNOSIS — E1129 Type 2 diabetes mellitus with other diabetic kidney complication: Secondary | ICD-10-CM | POA: Diagnosis not present

## 2016-08-11 DIAGNOSIS — D631 Anemia in chronic kidney disease: Secondary | ICD-10-CM | POA: Diagnosis not present

## 2016-08-11 DIAGNOSIS — E782 Mixed hyperlipidemia: Secondary | ICD-10-CM | POA: Diagnosis not present

## 2016-08-11 DIAGNOSIS — N186 End stage renal disease: Secondary | ICD-10-CM | POA: Diagnosis not present

## 2016-08-11 DIAGNOSIS — N2581 Secondary hyperparathyroidism of renal origin: Secondary | ICD-10-CM | POA: Diagnosis not present

## 2016-08-12 ENCOUNTER — Encounter: Payer: Self-pay | Admitting: Gastroenterology

## 2016-08-12 ENCOUNTER — Ambulatory Visit (INDEPENDENT_AMBULATORY_CARE_PROVIDER_SITE_OTHER): Payer: Medicare Other | Admitting: Gastroenterology

## 2016-08-12 VITALS — BP 134/64 | HR 60 | Ht <= 58 in | Wt 109.0 lb

## 2016-08-12 DIAGNOSIS — D500 Iron deficiency anemia secondary to blood loss (chronic): Secondary | ICD-10-CM

## 2016-08-12 DIAGNOSIS — Z1231 Encounter for screening mammogram for malignant neoplasm of breast: Secondary | ICD-10-CM | POA: Diagnosis not present

## 2016-08-12 DIAGNOSIS — K31819 Angiodysplasia of stomach and duodenum without bleeding: Secondary | ICD-10-CM | POA: Diagnosis not present

## 2016-08-12 DIAGNOSIS — Z853 Personal history of malignant neoplasm of breast: Secondary | ICD-10-CM | POA: Diagnosis not present

## 2016-08-12 DIAGNOSIS — I248 Other forms of acute ischemic heart disease: Secondary | ICD-10-CM

## 2016-08-12 DIAGNOSIS — N186 End stage renal disease: Secondary | ICD-10-CM

## 2016-08-12 DIAGNOSIS — Z992 Dependence on renal dialysis: Secondary | ICD-10-CM

## 2016-08-12 LAB — HM MAMMOGRAPHY

## 2016-08-12 NOTE — Progress Notes (Signed)
[name]    [id number]    [birth year]  Primary Care Physician:Gutierrez, Garlon Hatchet, MD  Referring Physician: [name], MD [place], [place] [address]  Chief complaint:  GAVE,   HPI:  83 year old female with history of end-stage renal disease on dialysis, gastric antral vascular ectasia status post multiple EGD with APC here for follow-up visit. Last EGD in April 2018 with evidence of GAVE treated with APC. Hemoglobin has remained stable in 10-11 range. Denies any nausea, vomiting, abdominal pain, melena or bright red blood per rectum She is doing well overall. Following lifestyle modification and antireflux measures.   Outpatient Encounter Prescriptions as of 08/12/2016  Medication Sig  . acetaminophen (TYLENOL) 500 MG tablet Take 500 mg by mouth daily as needed for moderate pain.   Marland Kitchen amLODipine (NORVASC) 5 MG tablet Take 1 tablet (5 mg total) by mouth at bedtime.  Marland Kitchen aspirin EC 81 MG tablet Take 1 tablet (81 mg total) by mouth daily.  Marland Kitchen atorvastatin (LIPITOR) 80 MG tablet Take 1 tablet (80 mg total) by mouth at bedtime.  . B Complex-C-Folic Acid (RENA-VITE PO) Take 1 tablet by mouth daily with breakfast.   . Calcium Carbonate Antacid (TUMS ULTRA 1000 PO) Take 3,000 mg by mouth 3 (three) times daily after meals.   . cholecalciferol (VITAMIN D) 1000 UNITS tablet Take 1,000 Units by mouth every Monday, Wednesday, and Friday.   . metoprolol tartrate (LOPRESSOR) 25 MG tablet Take 1 tablet (25 mg total) by mouth 2 (two) times daily. (Patient taking differently: Take 12.5 mg by mouth 2 (two) times daily. TAKE 1/2 TABLET (12.5 MG) IN THE MORNING AND 1/2 TABLET (12.5 MG) AT NIGHT.)  . NON FORMULARY Oxygen - 2 liters as needed  . ondansetron (ZOFRAN) 4 MG tablet Take 1 tablet (4 mg total) by mouth every 8 (eight) hours as needed for nausea or vomiting.  . pantoprazole (PROTONIX) 40 MG tablet TAKE 1 TABLET BY MOUTH TWICE A DAY (Patient taking differently:  take 80mg s in the morning)  . Polyethyl Glycol-Propyl Glycol (SYSTANE) 0.4-0.3 % GEL ophthalmic gel Place 1 application into the right eye every 6 (six) hours as needed (eye dryness or pain).  . traMADol (ULTRAM) 50 MG tablet Take 0.5 tablets (25 mg total) by mouth 2 (two) times daily as needed for moderate pain.  . [DISCONTINUED] sucralfate (CARAFATE) 1 g tablet Take 1 tablet (1 g total) by mouth 4 (four) times daily -  with meals and at bedtime.   No facility-administered encounter medications on file as of 08/12/2016.     Allergies as of 08/12/2016 - Review Complete 08/12/2016  Allergen Reaction Noted  . Sulfa antibiotics Rash and Other (See Comments) 12/14/2010    Past Medical History:  Diagnosis Date  . Anemia in chronic kidney disease    IV iron infusion and anemia from GAVE blood loss.  . Arthritis     hands  . Bradycardia 2014   a. Noted 06/2012.  . Breast cancer ([place])    Left Breast 13 yrs. ago, RADIATION TX DONE  . CAD (coronary artery disease), native coronary artery    a. 03/2015 Cath: LAD 89m, D2 75, LCX 80, OM1 85, RI 99, RCA 70p/m, 95d, AM 85-->poor candidate for CABG & PCI-->Med Rx.  . Cardiac murmur    a. thought due to AVF (2D echo 06/2012 without significant valvular disease).   . Carotid arterial disease ([place])  a. 04/2015 Carotid U/S: 1-39% bilat ICA stenosis.  Marland Kitchen Ectropion of right lower eyelid 11/2012   s/p surgery (Dr. [name])  . End stage renal disease on dialysis [facility])    HD M,W,F [place] AVF now LUE AVF HD X 4 YRS   . Facial droop 1935   acquired during forceps delivery, some residual R visual loss  . Family history of adverse reaction to anesthesia    brother trouble waking up and confusion, now deceased  . GAVE (gastric antral vascular ectasia) 08/2014   s/p EGD with APC  . GERD (gastroesophageal reflux disease)   . Hearing loss    Bilateral hearing aids  . History of blood transfusion 5 weeks ago  . History of home oxygen therapy     2 LITERS  OXYGEN PRN WHEN WALKING  . Hx of breast cancer 2004  . Hyperlipidemia   . Hypertension   . Hypotension    a. Associated w/ dialysis.  . Moderate mitral regurgitation    a. 05/2015 Echo: EF 55-60%, no rwma, triv AI, Ao sclerosis, mod MR, mildly to mod dil LA, PASP 75mmHg.  . Osteoporosis 11/2013   DEXA -3.4 radius IN BACK  . Sight impaired    Left: wears contact. Right: diminished peripheral vision    Past Surgical History:  Procedure Laterality Date  . AV FISTULA PLACEMENT  07/20/10   Right brachiocephalic AVF  . BASCILIC VEIN TRANSPOSITION Left 07/24/2013   Procedure: BASILIC VEIN TRANSPOSITION;  Surgeon: [name], MD;  Location: [place];  Service: Vascular;  Laterality: Left;  . BREAST BIOPSY Left   . BREAST LUMPECTOMY Left   . CARDIAC CATHETERIZATION N/A 04/10/2015   Procedure: Left Heart Cath and Coronary Angiography;  Surgeon: [name], MD;  Location: [place] CV LAB;  Service: Cardiovascular;  Laterality: N/A;  . CATARACT EXTRACTION Bilateral 1980  . DEXA  11/2015   DEXA T -4.3 [facility]  . ESOPHAGOGASTRODUODENOSCOPY N/A 04/09/2014   Procedure: ESOPHAGOGASTRODUODENOSCOPY (EGD);  Surgeon: [name], MD;  Location: [place];  Service: Endoscopy;  Laterality: N/A;  . ESOPHAGOGASTRODUODENOSCOPY N/A 09/23/2014   Procedure: ESOPHAGOGASTRODUODENOSCOPY (EGD);  Surgeon: [name], MD;  Location: [place];  Service: Endoscopy;  Laterality: N/A;  . ESOPHAGOGASTRODUODENOSCOPY N/A 12/26/2014   neg H pylori, benign biopsies; ESOPHAGOGASTRODUODENOSCOPY (EGD);  Surgeon: [name], MD  . ESOPHAGOGASTRODUODENOSCOPY N/A 06/15/2015   Procedure: ESOPHAGOGASTRODUODENOSCOPY (EGD);  Surgeon: Milus Banister, MD;  Location: [place];  Service: Endoscopy;  Laterality: N/A;  . ESOPHAGOGASTRODUODENOSCOPY N/A 06/27/2015   Procedure: ESOPHAGOGASTRODUODENOSCOPY (EGD);  Surgeon: Mauri Pole, MD;  Location: [facility];  Service: Endoscopy;  Laterality:  N/A;  . ESOPHAGOGASTRODUODENOSCOPY (EGD) WITH PROPOFOL N/A 11/12/2014   Procedure: ESOPHAGOGASTRODUODENOSCOPY (EGD) WITH PROPOFOL;  Surgeon: [name], MD;  Location: [facility];  Service: Endoscopy;  Laterality: N/A;  . ESOPHAGOGASTRODUODENOSCOPY (EGD) WITH PROPOFOL N/A 07/31/2015   Procedure: ESOPHAGOGASTRODUODENOSCOPY (EGD) WITH PROPOFOL;  Surgeon: Mauri Pole, MD;  Location: [facility];  Service: Endoscopy;  Laterality: N/A;  . ESOPHAGOGASTRODUODENOSCOPY (EGD) WITH PROPOFOL N/A 03/08/2016   Procedure: ESOPHAGOGASTRODUODENOSCOPY (EGD) WITH PROPOFOL;  Surgeon: Mauri Pole, MD;  Location: [facility];  Service: Endoscopy;  Laterality: N/A;  . ESOPHAGOGASTRODUODENOSCOPY (EGD) WITH PROPOFOL N/A 07/01/2016   Procedure: ESOPHAGOGASTRODUODENOSCOPY (EGD) WITH PROPOFOL;  Surgeon: Mauri Pole, MD;  Location: [facility];  Service: Endoscopy;  Laterality: N/A;  . EYE SURGERY  1966   regular cataract  . HERNIA REPAIR    .  HOT HEMOSTASIS N/A 11/12/2014   Procedure: HOT HEMOSTASIS (ARGON PLASMA COAGULATION/BICAP);  Surgeon: [name], MD;  Location: [facility];  Service: Endoscopy;  Laterality: N/A;  . HOT HEMOSTASIS N/A 06/27/2015   Procedure: HOT HEMOSTASIS (ARGON PLASMA COAGULATION/BICAP);  Surgeon: Mauri Pole, MD;  Location: [facility];  Service: Endoscopy;  Laterality: N/A;  . HOT HEMOSTASIS N/A 07/31/2015   Procedure: HOT HEMOSTASIS (ARGON PLASMA COAGULATION/BICAP);  Surgeon: Mauri Pole, MD;  Location: [facility];  Service: Endoscopy;  Laterality: N/A;  . HOT HEMOSTASIS N/A 07/01/2016   Procedure: HOT HEMOSTASIS (ARGON PLASMA COAGULATION/BICAP);  Surgeon: Mauri Pole, MD;  Location: [facility];  Service: Endoscopy;  Laterality: N/A;  . PERIPHERAL VASCULAR CATHETERIZATION N/A 08/15/2014   Procedure: A/V Shuntogram/Fistulagram;  Surgeon: [name], MD;  Location: [place] CV [facility];  Service: Cardiovascular;  Laterality: N/A;  . PERIPHERAL  VASCULAR CATHETERIZATION N/A 04/28/2015   Procedure: A/V Shuntogram/Fistulagram;  Surgeon: [name], MD;  Location: [place] CV LAB;  Service: Cardiovascular;  Laterality: N/A;  . PERIPHERAL VASCULAR CATHETERIZATION Left 04/28/2015   Procedure: A/V Shunt Intervention;  Surgeon: [name], MD;  Location: [place] CV LAB;  Service: Cardiovascular;  Laterality: Left;  . PERIPHERAL VASCULAR CATHETERIZATION Left 08/07/2015   Procedure: A/V Shuntogram/Fistulagram;  Surgeon: [name], MD;  Location: [place] CV LAB;  Service: Cardiovascular;  Laterality: Left;  . PERIPHERAL VASCULAR CATHETERIZATION N/A 08/07/2015   Procedure: A/V Shunt Intervention;  Surgeon: [name], MD;  Location: [place] CV LAB;  Service: Cardiovascular;  Laterality: N/A;  . PERIPHERAL VASCULAR CATHETERIZATION Left 11/03/2015   Procedure: A/V Shuntogram/Fistulagram;  Surgeon: [name], MD;  Location: [place] CV LAB;  Service: Cardiovascular;  Laterality: Left;  . PERIPHERAL VASCULAR CATHETERIZATION N/A 11/03/2015   Procedure: A/V Shunt Intervention;  Surgeon: [name], MD;  Location: [place] [facility];  Service: Cardiovascular;  Laterality: N/A;  . PERIPHERAL VASCULAR CATHETERIZATION N/A 03/19/2016   Procedure: A/V Fistulagram;  Surgeon: Katha Cabal, MD;  Location: [place] CV [facility];  Service: Cardiovascular;  Laterality: N/A;  . PERIPHERAL VASCULAR CATHETERIZATION N/A 03/30/2016   Procedure: A/V Fistulagram;  Surgeon: Katha Cabal, MD;  Location: [place] [facility];  Service: Cardiovascular;  Laterality: N/A;  . PERIPHERAL VASCULAR CATHETERIZATION N/A 03/30/2016   Procedure: A/V Shunt Intervention;  Surgeon: Katha Cabal, MD;  Location: [place] CV LAB;  Service: Cardiovascular;  Laterality: N/A;  . SHUNTOGRAM N/A 04/16/2011   Procedure: Earney Mallet;  Surgeon: [name], MD;  Location: [facility] CATH LAB;  Service: Cardiovascular;  Laterality: N/A;  . SHUNTOGRAM Right  07/05/2013   Procedure: FISTULOGRAM;  Surgeon: [name], MD;  Location: [facility] CATH LAB;  Service: Cardiovascular;  Laterality: Right;  . UMBILICAL HERNIA REPAIR  2011  . US ECHOCARDIOGRAPHY  06/2012   LVH, EF 65%, grade 1 diastol dysfunction, mod dliated LAD    Family History  Problem Relation Age of Onset  . Cancer Father        stomach  . CAD Father        MI at age 43  . Heart disease Father   . Heart attack Father   . Hypertension Mother   . Heart disease Brother        Congenital  . Hypertension Brother   . Diabetes Brother   . Cancer Sister        female (uterus?)  . Colon cancer Neg Hx   .  Esophageal cancer Neg Hx   . Stomach cancer Neg Hx     Social History   Social History  . Marital status: Single    Spouse name: N/A  . Number of children: N/A  . Years of education: N/A   Occupational History  . 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 Smoking status: Former Smoker    Years: 1.00    Types: Cigarettes    Quit date: 03/29/1953  . Smokeless tobacco: Never Used  . Alcohol use 0.0 oz/week     Comment: occasional  . Drug use: No  . Sexual activity: No   Other Topics Concern  . Not on file   Social History Narrative   As of 03/2013. Lives alone with her small dog.  Bother and sister in law died last year.     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  HENT: Positive for runny nose and sinus problem  Eyes: Negative for blurred vision.  Respiratory: Negative for cough, positive for shortness of breath and wheezing.   Cardiovascular: Negative for chest pain and palpitations.  Gastrointestinal: as per HPI Genitourinary: Negative for dysuria, urgency, frequency and hematuria.  on hemodialysis Musculoskeletal: Negative for myalgias, back pain and joint  pain.  Skin: Negative for itching and rash.  Neurological: Negative for dizziness, tremors, focal weakness, seizures and loss of consciousness.  Endo/Heme/Allergies: Positive for seasonal allergies.  positive for easy bruising and bleeding. Chronic anemia Psychiatric/Behavioral: Negative for depression, suicidal ideas and hallucinations.  All other systems reviewed and are negative.   Physical Exam: Vitals:   08/12/16 1025  BP: 134/64  Pulse: 60   Body mass index is 23.59 kg/m. Gen:      No acute distress. Facial asymmetry with right side paralysis HEENT:  EOMI, sclera anicteric Neck:     No masses; no thyromegaly Lungs:    Clear to auscultation . Decreased breath sounds on right side; normal respiratory effort CV:         Regular rate and rhythm; no murmurs Abd:      + bowel sounds; soft, non-tender; no palpable masses, no distension Ext:    No edema; adequate peripheral perfusion. Left arm fistula with good bruit Skin:      Warm and dry; no rash Neuro: alert and oriented x 3 Psych: normal mood and affect  Data Reviewed:  Reviewed labs, radiology imaging, old records and pertinent past GI work up   Assessment and Plan/Recommendations:  83 year old female with history of end-stage renal disease on hemodialysis, chronic iron deficiency anemia secondary to GI blood loss setting of gastric antral vascular ectasia, status post APC  Surveillance EGD in 6 months for repeat treatment of gave if needed Continue to monitor serial CBC during dialysis and advised patient to call with any change in hemoglobin or sign of overt GI bleed with melena or blood per rectum Follow-up in office in 1 year or sooner if needed  15 minutes was spent face-to-face with the patient. Greater than 50% of the time used for counseling as well as treatment plan and follow-up of GAVE and iron deficiency anemia. She had multiple questions which were answered to her satisfaction  K. Denzil Magnuson ,  MD [phone] Mon-Fri 8a-5p [phone] after 5p, weekends, holidays  CC: [name], MD

## 2016-08-12 NOTE — Patient Instructions (Addendum)
Follow up in 1 year  Your recall Endoscopy will be due in October 2018 ,  we will send you a reminder letter

## 2016-08-13 ENCOUNTER — Encounter: Payer: Self-pay | Admitting: Family Medicine

## 2016-08-13 DIAGNOSIS — N186 End stage renal disease: Secondary | ICD-10-CM | POA: Diagnosis not present

## 2016-08-13 DIAGNOSIS — N2581 Secondary hyperparathyroidism of renal origin: Secondary | ICD-10-CM | POA: Diagnosis not present

## 2016-08-13 DIAGNOSIS — E1129 Type 2 diabetes mellitus with other diabetic kidney complication: Secondary | ICD-10-CM | POA: Diagnosis not present

## 2016-08-13 DIAGNOSIS — D631 Anemia in chronic kidney disease: Secondary | ICD-10-CM | POA: Diagnosis not present

## 2016-08-13 DIAGNOSIS — E782 Mixed hyperlipidemia: Secondary | ICD-10-CM | POA: Diagnosis not present

## 2016-08-16 ENCOUNTER — Encounter: Payer: Self-pay | Admitting: Family Medicine

## 2016-08-16 DIAGNOSIS — N186 End stage renal disease: Secondary | ICD-10-CM | POA: Diagnosis not present

## 2016-08-16 DIAGNOSIS — E782 Mixed hyperlipidemia: Secondary | ICD-10-CM | POA: Diagnosis not present

## 2016-08-16 DIAGNOSIS — D631 Anemia in chronic kidney disease: Secondary | ICD-10-CM | POA: Diagnosis not present

## 2016-08-16 DIAGNOSIS — N2581 Secondary hyperparathyroidism of renal origin: Secondary | ICD-10-CM | POA: Diagnosis not present

## 2016-08-16 DIAGNOSIS — E1129 Type 2 diabetes mellitus with other diabetic kidney complication: Secondary | ICD-10-CM | POA: Diagnosis not present

## 2016-08-18 ENCOUNTER — Encounter: Payer: Self-pay | Admitting: Gastroenterology

## 2016-08-18 DIAGNOSIS — N186 End stage renal disease: Secondary | ICD-10-CM | POA: Diagnosis not present

## 2016-08-18 DIAGNOSIS — E782 Mixed hyperlipidemia: Secondary | ICD-10-CM | POA: Diagnosis not present

## 2016-08-18 DIAGNOSIS — N2581 Secondary hyperparathyroidism of renal origin: Secondary | ICD-10-CM | POA: Diagnosis not present

## 2016-08-18 DIAGNOSIS — D631 Anemia in chronic kidney disease: Secondary | ICD-10-CM | POA: Diagnosis not present

## 2016-08-18 DIAGNOSIS — E1129 Type 2 diabetes mellitus with other diabetic kidney complication: Secondary | ICD-10-CM | POA: Diagnosis not present

## 2016-08-20 DIAGNOSIS — E782 Mixed hyperlipidemia: Secondary | ICD-10-CM | POA: Diagnosis not present

## 2016-08-20 DIAGNOSIS — E1129 Type 2 diabetes mellitus with other diabetic kidney complication: Secondary | ICD-10-CM | POA: Diagnosis not present

## 2016-08-20 DIAGNOSIS — D631 Anemia in chronic kidney disease: Secondary | ICD-10-CM | POA: Diagnosis not present

## 2016-08-20 DIAGNOSIS — N186 End stage renal disease: Secondary | ICD-10-CM | POA: Diagnosis not present

## 2016-08-20 DIAGNOSIS — N2581 Secondary hyperparathyroidism of renal origin: Secondary | ICD-10-CM | POA: Diagnosis not present

## 2016-08-23 DIAGNOSIS — D631 Anemia in chronic kidney disease: Secondary | ICD-10-CM | POA: Diagnosis not present

## 2016-08-23 DIAGNOSIS — N2581 Secondary hyperparathyroidism of renal origin: Secondary | ICD-10-CM | POA: Diagnosis not present

## 2016-08-23 DIAGNOSIS — E1129 Type 2 diabetes mellitus with other diabetic kidney complication: Secondary | ICD-10-CM | POA: Diagnosis not present

## 2016-08-23 DIAGNOSIS — N186 End stage renal disease: Secondary | ICD-10-CM | POA: Diagnosis not present

## 2016-08-23 DIAGNOSIS — E782 Mixed hyperlipidemia: Secondary | ICD-10-CM | POA: Diagnosis not present

## 2016-08-24 ENCOUNTER — Ambulatory Visit (INDEPENDENT_AMBULATORY_CARE_PROVIDER_SITE_OTHER): Payer: Medicare Other | Admitting: Internal Medicine

## 2016-08-24 ENCOUNTER — Encounter: Payer: Self-pay | Admitting: Internal Medicine

## 2016-08-24 VITALS — BP 126/58 | HR 73 | Temp 97.8°F

## 2016-08-24 DIAGNOSIS — M79605 Pain in left leg: Secondary | ICD-10-CM | POA: Diagnosis not present

## 2016-08-24 DIAGNOSIS — I248 Other forms of acute ischemic heart disease: Secondary | ICD-10-CM | POA: Diagnosis not present

## 2016-08-24 NOTE — Progress Notes (Signed)
Subjective:    Patient ID: [name], female    DOB: 12-08-1931, 83 y.o.   MRN: [id number]  HPI  Pt presents to the clinic today with c/o left leg pain. She reports this started 4 days ago. She describes the pain as sore, achy and pulling. She has difficulty bearing weight on her left leg. She also reports pain with moving from a sitting to a standing position. She denies any recent fall or injury to the area but reports she was started on oxygen 2 weeks ago. She reports the oxygen canister is very heavy and thinks this is related to lifting the oxygen cannister.  Review of Systems      Past Medical History:  Diagnosis Date  . Anemia in chronic kidney disease    IV iron infusion and anemia from GAVE blood loss.  . Arthritis     hands  . Bradycardia 2014   a. Noted 06/2012.  . Breast cancer ([place])    Left Breast 13 yrs. ago, RADIATION TX DONE  . CAD (coronary artery disease), native coronary artery    a. 03/2015 Cath: LAD 74m, D2 75, LCX 80, OM1 85, RI 99, RCA 70p/m, 95d, AM 85-->poor candidate for CABG & PCI-->Med Rx.  . Cardiac murmur    a. thought due to AVF (2D echo 06/2012 without significant valvular disease).   . Carotid arterial disease ([place])    a. 04/2015 Carotid U/S: 1-39% bilat ICA stenosis.  Marland Kitchen Ectropion of right lower eyelid 11/2012   s/p surgery (Dr. [name])  . End stage renal disease on dialysis [facility])    HD M,W,F [place] AVF now LUE AVF HD X 4 YRS   . Facial droop 1935   acquired during forceps delivery, some residual R visual loss  . Family history of adverse reaction to anesthesia    brother trouble waking up and confusion, now deceased  . GAVE (gastric antral vascular ectasia) 08/2014   s/p EGD with APC  . GERD (gastroesophageal reflux disease)   . Hearing loss    Bilateral hearing aids  . History of blood transfusion 5 weeks ago  . History of home oxygen therapy    2 LITERS  OXYGEN PRN WHEN WALKING  . Hx of breast cancer 2004  .  Hyperlipidemia   . Hypertension   . Hypotension    a. Associated w/ dialysis.  . Moderate mitral regurgitation    a. 05/2015 Echo: EF 55-60%, no rwma, triv AI, Ao sclerosis, mod MR, mildly to mod dil LA, PASP 21mmHg.  . Osteoporosis 11/2013   DEXA -3.4 radius IN BACK  . Sight impaired    Left: wears contact. Right: diminished peripheral vision    Current Outpatient Prescriptions  Medication Sig Dispense Refill  . acetaminophen (TYLENOL) 500 MG tablet Take 500 mg by mouth daily as needed for moderate pain.     Marland Kitchen amLODipine (NORVASC) 5 MG tablet Take 1 tablet (5 mg total) by mouth at bedtime. 90 tablet 3  . aspirin EC 81 MG tablet Take 1 tablet (81 mg total) by mouth daily. 90 tablet 3  . atorvastatin (LIPITOR) 80 MG tablet Take 1 tablet (80 mg total) by mouth at bedtime. 90 tablet 3  . B Complex-C-Folic Acid (RENA-VITE PO) Take 1 tablet by mouth daily with breakfast.     . Calcium Carbonate Antacid (TUMS ULTRA 1000 PO) Take 3,000 mg by mouth 3 (three) times daily after meals.     . cholecalciferol (  VITAMIN D) 1000 UNITS tablet Take 1,000 Units by mouth every Monday, Wednesday, and Friday.     . metoprolol tartrate (LOPRESSOR) 25 MG tablet Take 1 tablet (25 mg total) by mouth 2 (two) times daily. (Patient taking differently: Take 12.5 mg by mouth 2 (two) times daily. TAKE 1/2 TABLET (12.5 MG) IN THE MORNING AND 1/2 TABLET (12.5 MG) AT NIGHT.) 180 tablet 3  . NON FORMULARY Oxygen - 2 liters as needed    . ondansetron (ZOFRAN) 4 MG tablet Take 1 tablet (4 mg total) by mouth every 8 (eight) hours as needed for nausea or vomiting. 20 tablet 0  . pantoprazole (PROTONIX) 40 MG tablet TAKE 1 TABLET BY MOUTH TWICE A DAY (Patient taking differently: take 80mg s in the morning) 60 tablet 5  . Polyethyl Glycol-Propyl Glycol (SYSTANE) 0.4-0.3 % GEL ophthalmic gel Place 1 application into the right eye every 6 (six) hours as needed (eye dryness or pain).    . traMADol (ULTRAM) 50 MG tablet Take 0.5 tablets  (25 mg total) by mouth 2 (two) times daily as needed for moderate pain. 20 tablet 0   No current facility-administered medications for this visit.     Allergies  Allergen Reactions  . Sulfa Antibiotics Rash and Other (See Comments)    fever    Family History  Problem Relation Age of Onset  . Cancer Father        stomach  . CAD Father        MI at age 66  . Heart disease Father   . Heart attack Father   . Hypertension Mother   . Heart disease Brother        Congenital  . Hypertension Brother   . Diabetes Brother   . Cancer Sister        female (uterus?)  . Colon cancer Neg Hx   . Esophageal cancer Neg Hx   . Stomach cancer Neg Hx     Social History   Social History  . Marital status: Single    Spouse name: N/A  . Number of children: N/A  . Years of education: N/A   Occupational History  . 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 Smoking status: Former Smoker    Years: 1.00    Types: Cigarettes    Quit date: 03/29/1953  . Smokeless tobacco: Never Used  . Alcohol use 0.0 oz/week     Comment: occasional  . Drug use: No  . Sexual activity: No   Other Topics Concern  . Not on file   Social History Narrative   As of 03/2013. Lives alone with her small dog.  Bother and sister in law died last year.     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 Denies decrease in range of motion or joint pain and swelling.   Neurological: Denies dizziness, difficulty with memory, difficulty with speech.    No other specific complaints in a complete review of systems (except as listed in HPI above).  Objective:   Physical Exam   BP (!) 126/58   Pulse 73   Temp 97.8 F (36.6 C) (Oral)   SpO2 90%  Wt Readings from Last 3 Encounters:  08/12/16 109 lb (49.4 kg)  07/27/16 110 lb 8 oz (50.1 kg)  06/30/16 109 lb  (  49.4 kg)    General: Appears her stated age, chronically ill appearing, in NAD. Musculoskeletal: Normal flexion, extension of the hip. Pain with internal and external rotation of the left hip.  Pain with palpation over the proximal quadricep. No pain with palpation over the hip bone. Gait slow but steady with the use of a cane.    BMET    Component Value Date/Time   NA 142 06/16/2016 1113   NA 136 10/23/2013 1504   K 3.4 (L) 06/16/2016 1113   K 3.7 11/07/2013 1055   K 5.1 07/12/2012   CL 96 06/16/2016 1113   CL 98 10/23/2013 1504   CO2 34 (H) 06/16/2016 1113   CO2 30 10/23/2013 1504   GLUCOSE 122 (H) 06/16/2016 1113   GLUCOSE 231 (H) 10/23/2013 1504   BUN 24 (H) 06/16/2016 1113   BUN 49 (H) 10/23/2013 1504   CREATININE 3.68 (H) 06/16/2016 1113   CREATININE 5.95 (H) 10/23/2013 1504   CREATININE 6.9 02/14/2013   CALCIUM 8.7 06/16/2016 1113   CALCIUM 9.0 10/23/2013 1504   GFRNONAA 3 (L) 03/30/2016 0853   GFRNONAA 6 (L) 10/23/2013 1504   GFRAA 3 (L) 03/30/2016 0853   GFRAA 7 (L) 10/23/2013 1504    Lipid Panel     Component Value Date/Time   CHOL 122 06/19/2015 0949   TRIG 73 06/19/2015 0949   HDL 65 06/19/2015 0949   CHOLHDL 1.9 06/19/2015 0949   VLDL 15 06/19/2015 0949   LDLCALC 42 06/19/2015 0949    CBC    Component Value Date/Time   WBC 6.4 06/16/2016 1113   RBC 3.70 (L) 06/16/2016 1113   HGB 10.8 (L) 06/16/2016 1113   HGB 9.2 11/08/2014   HCT 34.1 (L) 06/16/2016 1113   HCT 31.4 (L) 10/23/2013 1504   HCT 32.7 (L) 01/12/2010 1459   PLT 208.0 06/16/2016 1113   PLT 218 10/23/2013 1504   PLT 257 01/12/2010 1459   MCV 92.0 06/16/2016 1113   MCV 98 10/23/2013 1504   MCV 89.4 01/12/2010 1459   MCH 29.5 06/19/2015 0949   MCHC 31.6 06/16/2016 1113   RDW 19.8 (H) 06/16/2016 1113   RDW 16.2 (H) 10/23/2013 1504   RDW 14.6 (H) 01/12/2010 1459   LYMPHSABS 1.4 10/30/2015 0916   LYMPHSABS 1.9 01/12/2010 1459   MONOABS 0.9 10/30/2015 0916   MONOABS 0.7 01/12/2010  1459   EOSABS 0.3 10/30/2015 0916   EOSABS 0.2 01/12/2010 1459   BASOSABS 0.0 10/30/2015 0916   BASOSABS 0.0 01/12/2010 1459    Hgb A1C Lab Results  Component Value Date   HGBA1C 5.4 09/23/2014           Assessment & Plan:   Left Leg Pain:  ? Muscle strain of quad Does not apper to be hip related Try Aspercream OTC Continue Tramadol prn Heat may be helpful May benefit from PT if no improvement  RTC as needed or if symptoms persist or worsen Webb Silversmith, NP

## 2016-08-26 DIAGNOSIS — N186 End stage renal disease: Secondary | ICD-10-CM | POA: Diagnosis not present

## 2016-08-26 DIAGNOSIS — D631 Anemia in chronic kidney disease: Secondary | ICD-10-CM | POA: Diagnosis not present

## 2016-08-26 DIAGNOSIS — N2581 Secondary hyperparathyroidism of renal origin: Secondary | ICD-10-CM | POA: Diagnosis not present

## 2016-08-26 DIAGNOSIS — Z992 Dependence on renal dialysis: Secondary | ICD-10-CM | POA: Diagnosis not present

## 2016-08-26 DIAGNOSIS — E782 Mixed hyperlipidemia: Secondary | ICD-10-CM | POA: Diagnosis not present

## 2016-08-26 DIAGNOSIS — E1129 Type 2 diabetes mellitus with other diabetic kidney complication: Secondary | ICD-10-CM | POA: Diagnosis not present

## 2016-08-27 DIAGNOSIS — E1129 Type 2 diabetes mellitus with other diabetic kidney complication: Secondary | ICD-10-CM | POA: Diagnosis not present

## 2016-08-27 DIAGNOSIS — D631 Anemia in chronic kidney disease: Secondary | ICD-10-CM | POA: Diagnosis not present

## 2016-08-27 DIAGNOSIS — N186 End stage renal disease: Secondary | ICD-10-CM | POA: Diagnosis not present

## 2016-08-27 DIAGNOSIS — N2581 Secondary hyperparathyroidism of renal origin: Secondary | ICD-10-CM | POA: Diagnosis not present

## 2016-08-27 DIAGNOSIS — E782 Mixed hyperlipidemia: Secondary | ICD-10-CM | POA: Diagnosis not present

## 2016-08-30 DIAGNOSIS — D631 Anemia in chronic kidney disease: Secondary | ICD-10-CM | POA: Diagnosis not present

## 2016-08-30 DIAGNOSIS — N2581 Secondary hyperparathyroidism of renal origin: Secondary | ICD-10-CM | POA: Diagnosis not present

## 2016-08-30 DIAGNOSIS — E1129 Type 2 diabetes mellitus with other diabetic kidney complication: Secondary | ICD-10-CM | POA: Diagnosis not present

## 2016-08-30 DIAGNOSIS — E782 Mixed hyperlipidemia: Secondary | ICD-10-CM | POA: Diagnosis not present

## 2016-08-30 DIAGNOSIS — N186 End stage renal disease: Secondary | ICD-10-CM | POA: Diagnosis not present

## 2016-09-01 DIAGNOSIS — D631 Anemia in chronic kidney disease: Secondary | ICD-10-CM | POA: Diagnosis not present

## 2016-09-01 DIAGNOSIS — E1129 Type 2 diabetes mellitus with other diabetic kidney complication: Secondary | ICD-10-CM | POA: Diagnosis not present

## 2016-09-01 DIAGNOSIS — N186 End stage renal disease: Secondary | ICD-10-CM | POA: Diagnosis not present

## 2016-09-01 DIAGNOSIS — N2581 Secondary hyperparathyroidism of renal origin: Secondary | ICD-10-CM | POA: Diagnosis not present

## 2016-09-01 DIAGNOSIS — E782 Mixed hyperlipidemia: Secondary | ICD-10-CM | POA: Diagnosis not present

## 2016-09-03 ENCOUNTER — Telehealth: Payer: Self-pay | Admitting: Family Medicine

## 2016-09-03 DIAGNOSIS — M25552 Pain in left hip: Secondary | ICD-10-CM | POA: Diagnosis not present

## 2016-09-03 DIAGNOSIS — M1612 Unilateral primary osteoarthritis, left hip: Secondary | ICD-10-CM | POA: Diagnosis not present

## 2016-09-03 NOTE — Telephone Encounter (Signed)
I spoke with pt and she does not want to go to [facility] in [place] so pt is going to [facility] in [place]. FYI to DR G.

## 2016-09-03 NOTE — Addendum Note (Signed)
Addendum  created 09/03/16 1140 by Lyn Hollingshead, MD   Sign clinical note

## 2016-09-03 NOTE — Telephone Encounter (Signed)
Patient Name: [name] DOB: 1931-10-05 Initial Comment Caller states she has pulled the muscle in her leg again. She was seen in the office 10 days ago with the same issue. Unable to get up and down from a sitting postition. Nurse Assessment Nurse: Sherrell Puller, RN, Amy Date/Time [name] Time): 09/03/2016 9:27:19 AM Confirm and document reason for call. If symptomatic, describe symptoms. ---Caller states she has a pulled muscle in her left leg, saw doctor 10 days ago for this. Says she is having a hard time getting up from a sitting position. Says she has been taking Aleve and it has helped her some. No other symptoms. Does the patient have any new or worsening symptoms? ---Yes Will a triage be completed? ---Yes Related visit to physician within the last 2 weeks? ---Yes Does the PT have any chronic conditions? (i.e. diabetes, asthma, etc.) ---Yes List chronic conditions. ---HTN, high cholesterol, kidney failure Is this a behavioral health or substance abuse call? ---No Guidelines Guideline Title Affirmed Question Affirmed Notes Leg Pain [1] Thigh or calf pain AND [2] only 1 side AND [3] present > 1 hour Final Disposition User See Physician within 4 Hours (or PCP triage) Sherrell Puller, RN, Amy Comments No appts available with PCP or primary office, checked other and no appt available in recommended time frame at a location convenient for the pt. Referrals GO TO FACILITY OTHER - SPECIFY Disagree/Comply: Comply

## 2016-09-04 DIAGNOSIS — N2581 Secondary hyperparathyroidism of renal origin: Secondary | ICD-10-CM | POA: Diagnosis not present

## 2016-09-04 DIAGNOSIS — D631 Anemia in chronic kidney disease: Secondary | ICD-10-CM | POA: Diagnosis not present

## 2016-09-04 DIAGNOSIS — E782 Mixed hyperlipidemia: Secondary | ICD-10-CM | POA: Diagnosis not present

## 2016-09-04 DIAGNOSIS — E1129 Type 2 diabetes mellitus with other diabetic kidney complication: Secondary | ICD-10-CM | POA: Diagnosis not present

## 2016-09-04 DIAGNOSIS — N186 End stage renal disease: Secondary | ICD-10-CM | POA: Diagnosis not present

## 2016-09-06 DIAGNOSIS — D631 Anemia in chronic kidney disease: Secondary | ICD-10-CM | POA: Diagnosis not present

## 2016-09-06 DIAGNOSIS — E1129 Type 2 diabetes mellitus with other diabetic kidney complication: Secondary | ICD-10-CM | POA: Diagnosis not present

## 2016-09-06 DIAGNOSIS — N2581 Secondary hyperparathyroidism of renal origin: Secondary | ICD-10-CM | POA: Diagnosis not present

## 2016-09-06 DIAGNOSIS — N186 End stage renal disease: Secondary | ICD-10-CM | POA: Diagnosis not present

## 2016-09-06 DIAGNOSIS — E782 Mixed hyperlipidemia: Secondary | ICD-10-CM | POA: Diagnosis not present

## 2016-09-07 DIAGNOSIS — M7072 Other bursitis of hip, left hip: Secondary | ICD-10-CM | POA: Diagnosis not present

## 2016-09-08 ENCOUNTER — Encounter: Payer: Self-pay | Admitting: Gastroenterology

## 2016-09-08 DIAGNOSIS — D631 Anemia in chronic kidney disease: Secondary | ICD-10-CM | POA: Diagnosis not present

## 2016-09-08 DIAGNOSIS — E782 Mixed hyperlipidemia: Secondary | ICD-10-CM | POA: Diagnosis not present

## 2016-09-08 DIAGNOSIS — N186 End stage renal disease: Secondary | ICD-10-CM | POA: Diagnosis not present

## 2016-09-08 DIAGNOSIS — E1129 Type 2 diabetes mellitus with other diabetic kidney complication: Secondary | ICD-10-CM | POA: Diagnosis not present

## 2016-09-08 DIAGNOSIS — N2581 Secondary hyperparathyroidism of renal origin: Secondary | ICD-10-CM | POA: Diagnosis not present

## 2016-09-11 DIAGNOSIS — N2581 Secondary hyperparathyroidism of renal origin: Secondary | ICD-10-CM | POA: Diagnosis not present

## 2016-09-11 DIAGNOSIS — E782 Mixed hyperlipidemia: Secondary | ICD-10-CM | POA: Diagnosis not present

## 2016-09-11 DIAGNOSIS — E1129 Type 2 diabetes mellitus with other diabetic kidney complication: Secondary | ICD-10-CM | POA: Diagnosis not present

## 2016-09-11 DIAGNOSIS — N186 End stage renal disease: Secondary | ICD-10-CM | POA: Diagnosis not present

## 2016-09-11 DIAGNOSIS — D631 Anemia in chronic kidney disease: Secondary | ICD-10-CM | POA: Diagnosis not present

## 2016-09-13 DIAGNOSIS — M06252 Rheumatoid bursitis, left hip: Secondary | ICD-10-CM | POA: Diagnosis not present

## 2016-09-13 DIAGNOSIS — M25552 Pain in left hip: Secondary | ICD-10-CM | POA: Diagnosis not present

## 2016-09-14 DIAGNOSIS — N2581 Secondary hyperparathyroidism of renal origin: Secondary | ICD-10-CM | POA: Diagnosis not present

## 2016-09-14 DIAGNOSIS — N186 End stage renal disease: Secondary | ICD-10-CM | POA: Diagnosis not present

## 2016-09-14 DIAGNOSIS — D631 Anemia in chronic kidney disease: Secondary | ICD-10-CM | POA: Diagnosis not present

## 2016-09-14 DIAGNOSIS — E782 Mixed hyperlipidemia: Secondary | ICD-10-CM | POA: Diagnosis not present

## 2016-09-14 DIAGNOSIS — E1129 Type 2 diabetes mellitus with other diabetic kidney complication: Secondary | ICD-10-CM | POA: Diagnosis not present

## 2016-09-15 DIAGNOSIS — M06252 Rheumatoid bursitis, left hip: Secondary | ICD-10-CM | POA: Diagnosis not present

## 2016-09-15 DIAGNOSIS — M25552 Pain in left hip: Secondary | ICD-10-CM | POA: Diagnosis not present

## 2016-09-16 ENCOUNTER — Inpatient Hospital Stay (HOSPITAL_COMMUNITY)
Admission: EM | Admit: 2016-09-16 | Discharge: 2016-09-21 | DRG: 469 | Disposition: A | Discharge status: Skilled Nursing Facility | Payer: Medicare Other | Attending: Internal Medicine | Admitting: Internal Medicine

## 2016-09-16 ENCOUNTER — Emergency Department (HOSPITAL_COMMUNITY): Payer: Medicare Other

## 2016-09-16 ENCOUNTER — Encounter (HOSPITAL_COMMUNITY): Payer: Self-pay | Admitting: Emergency Medicine

## 2016-09-16 DIAGNOSIS — S0181XA Laceration without foreign body of other part of head, initial encounter: Secondary | ICD-10-CM | POA: Diagnosis present

## 2016-09-16 DIAGNOSIS — I10 Essential (primary) hypertension: Secondary | ICD-10-CM | POA: Diagnosis present

## 2016-09-16 DIAGNOSIS — I5022 Chronic systolic (congestive) heart failure: Secondary | ICD-10-CM | POA: Diagnosis not present

## 2016-09-16 DIAGNOSIS — I25119 Atherosclerotic heart disease of native coronary artery with unspecified angina pectoris: Secondary | ICD-10-CM | POA: Diagnosis not present

## 2016-09-16 DIAGNOSIS — E43 Unspecified severe protein-calorie malnutrition: Secondary | ICD-10-CM | POA: Diagnosis present

## 2016-09-16 DIAGNOSIS — Z01810 Encounter for preprocedural cardiovascular examination: Secondary | ICD-10-CM | POA: Diagnosis not present

## 2016-09-16 DIAGNOSIS — D696 Thrombocytopenia, unspecified: Secondary | ICD-10-CM | POA: Diagnosis not present

## 2016-09-16 DIAGNOSIS — D62 Acute posthemorrhagic anemia: Secondary | ICD-10-CM | POA: Diagnosis not present

## 2016-09-16 DIAGNOSIS — R29810 Facial weakness: Secondary | ICD-10-CM | POA: Diagnosis present

## 2016-09-16 DIAGNOSIS — M25559 Pain in unspecified hip: Secondary | ICD-10-CM | POA: Diagnosis not present

## 2016-09-16 DIAGNOSIS — Z7982 Long term (current) use of aspirin: Secondary | ICD-10-CM

## 2016-09-16 DIAGNOSIS — J9611 Chronic respiratory failure with hypoxia: Secondary | ICD-10-CM | POA: Diagnosis not present

## 2016-09-16 DIAGNOSIS — K31819 Angiodysplasia of stomach and duodenum without bleeding: Secondary | ICD-10-CM | POA: Diagnosis present

## 2016-09-16 DIAGNOSIS — I272 Pulmonary hypertension, unspecified: Secondary | ICD-10-CM | POA: Diagnosis present

## 2016-09-16 DIAGNOSIS — K219 Gastro-esophageal reflux disease without esophagitis: Secondary | ICD-10-CM | POA: Diagnosis present

## 2016-09-16 DIAGNOSIS — Z9981 Dependence on supplemental oxygen: Secondary | ICD-10-CM

## 2016-09-16 DIAGNOSIS — E785 Hyperlipidemia, unspecified: Secondary | ICD-10-CM | POA: Diagnosis present

## 2016-09-16 DIAGNOSIS — W1830XA Fall on same level, unspecified, initial encounter: Secondary | ICD-10-CM | POA: Diagnosis present

## 2016-09-16 DIAGNOSIS — M810 Age-related osteoporosis without current pathological fracture: Secondary | ICD-10-CM | POA: Diagnosis present

## 2016-09-16 DIAGNOSIS — I2510 Atherosclerotic heart disease of native coronary artery without angina pectoris: Secondary | ICD-10-CM | POA: Diagnosis present

## 2016-09-16 DIAGNOSIS — I120 Hypertensive chronic kidney disease with stage 5 chronic kidney disease or end stage renal disease: Secondary | ICD-10-CM | POA: Diagnosis not present

## 2016-09-16 DIAGNOSIS — Z8781 Personal history of (healed) traumatic fracture: Secondary | ICD-10-CM

## 2016-09-16 DIAGNOSIS — I130 Hypertensive heart and chronic kidney disease with heart failure and stage 1 through stage 4 chronic kidney disease, or unspecified chronic kidney disease: Secondary | ICD-10-CM | POA: Diagnosis not present

## 2016-09-16 DIAGNOSIS — Z923 Personal history of irradiation: Secondary | ICD-10-CM

## 2016-09-16 DIAGNOSIS — E46 Unspecified protein-calorie malnutrition: Secondary | ICD-10-CM

## 2016-09-16 DIAGNOSIS — N186 End stage renal disease: Secondary | ICD-10-CM | POA: Diagnosis not present

## 2016-09-16 DIAGNOSIS — N390 Urinary tract infection, site not specified: Secondary | ICD-10-CM | POA: Diagnosis present

## 2016-09-16 DIAGNOSIS — Z419 Encounter for procedure for purposes other than remedying health state, unspecified: Secondary | ICD-10-CM

## 2016-09-16 DIAGNOSIS — Z853 Personal history of malignant neoplasm of breast: Secondary | ICD-10-CM

## 2016-09-16 DIAGNOSIS — Z992 Dependence on renal dialysis: Secondary | ICD-10-CM | POA: Diagnosis not present

## 2016-09-16 DIAGNOSIS — Z471 Aftercare following joint replacement surgery: Secondary | ICD-10-CM | POA: Diagnosis not present

## 2016-09-16 DIAGNOSIS — Z96642 Presence of left artificial hip joint: Secondary | ICD-10-CM | POA: Diagnosis not present

## 2016-09-16 DIAGNOSIS — R739 Hyperglycemia, unspecified: Secondary | ICD-10-CM | POA: Diagnosis present

## 2016-09-16 DIAGNOSIS — N2581 Secondary hyperparathyroidism of renal origin: Secondary | ICD-10-CM | POA: Diagnosis not present

## 2016-09-16 DIAGNOSIS — D500 Iron deficiency anemia secondary to blood loss (chronic): Secondary | ICD-10-CM | POA: Diagnosis present

## 2016-09-16 DIAGNOSIS — S72009A Fracture of unspecified part of neck of unspecified femur, initial encounter for closed fracture: Secondary | ICD-10-CM | POA: Diagnosis present

## 2016-09-16 DIAGNOSIS — R011 Cardiac murmur, unspecified: Secondary | ICD-10-CM | POA: Diagnosis present

## 2016-09-16 DIAGNOSIS — F39 Unspecified mood [affective] disorder: Secondary | ICD-10-CM | POA: Diagnosis present

## 2016-09-16 DIAGNOSIS — S72002A Fracture of unspecified part of neck of left femur, initial encounter for closed fracture: Secondary | ICD-10-CM | POA: Diagnosis not present

## 2016-09-16 DIAGNOSIS — R2689 Other abnormalities of gait and mobility: Secondary | ICD-10-CM | POA: Diagnosis present

## 2016-09-16 DIAGNOSIS — I340 Nonrheumatic mitral (valve) insufficiency: Secondary | ICD-10-CM | POA: Diagnosis not present

## 2016-09-16 DIAGNOSIS — D631 Anemia in chronic kidney disease: Secondary | ICD-10-CM | POA: Diagnosis present

## 2016-09-16 DIAGNOSIS — I5021 Acute systolic (congestive) heart failure: Secondary | ICD-10-CM | POA: Diagnosis not present

## 2016-09-16 DIAGNOSIS — Z6821 Body mass index (BMI) 21.0-21.9, adult: Secondary | ICD-10-CM

## 2016-09-16 DIAGNOSIS — R0609 Other forms of dyspnea: Secondary | ICD-10-CM

## 2016-09-16 DIAGNOSIS — M85852 Other specified disorders of bone density and structure, left thigh: Secondary | ICD-10-CM | POA: Diagnosis not present

## 2016-09-16 DIAGNOSIS — Z79899 Other long term (current) drug therapy: Secondary | ICD-10-CM

## 2016-09-16 DIAGNOSIS — M12552 Traumatic arthropathy, left hip: Secondary | ICD-10-CM | POA: Diagnosis not present

## 2016-09-16 DIAGNOSIS — I509 Heart failure, unspecified: Secondary | ICD-10-CM | POA: Diagnosis present

## 2016-09-16 DIAGNOSIS — I132 Hypertensive heart and chronic kidney disease with heart failure and with stage 5 chronic kidney disease, or end stage renal disease: Secondary | ICD-10-CM | POA: Diagnosis present

## 2016-09-16 DIAGNOSIS — M25552 Pain in left hip: Secondary | ICD-10-CM | POA: Diagnosis not present

## 2016-09-16 DIAGNOSIS — Z87891 Personal history of nicotine dependence: Secondary | ICD-10-CM

## 2016-09-16 DIAGNOSIS — S72143A Displaced intertrochanteric fracture of unspecified femur, initial encounter for closed fracture: Secondary | ICD-10-CM | POA: Diagnosis not present

## 2016-09-16 DIAGNOSIS — H9190 Unspecified hearing loss, unspecified ear: Secondary | ICD-10-CM | POA: Diagnosis present

## 2016-09-16 LAB — BASIC METABOLIC PANEL
Anion gap: 18 — ABNORMAL HIGH (ref 5–15)
BUN: 58 mg/dL — AB (ref 6–20)
CALCIUM: 8.9 mg/dL (ref 8.9–10.3)
CO2: 25 mmol/L (ref 22–32)
Chloride: 96 mmol/L — ABNORMAL LOW (ref 101–111)
Creatinine, Ser: 7.07 mg/dL — ABNORMAL HIGH (ref 0.44–1.00)
GFR calc Af Amer: 6 mL/min — ABNORMAL LOW (ref >60)
GFR, EST NON AFRICAN AMERICAN: 5 mL/min — AB (ref >60)
GLUCOSE: 119 mg/dL — AB (ref 65–99)
Potassium: 4.3 mmol/L (ref 3.5–5.1)
Sodium: 139 mmol/L (ref 135–145)

## 2016-09-16 LAB — CBC
HCT: 34.8 % — ABNORMAL LOW (ref 36.0–46.0)
Hemoglobin: 11.2 g/dL — ABNORMAL LOW (ref 12.0–15.0)
MCH: 28.7 pg (ref 26.0–34.0)
MCHC: 32.2 g/dL (ref 30.0–36.0)
MCV: 89.2 fL (ref 78.0–100.0)
Platelets: 231 10*3/uL (ref 150–400)
RBC: 3.9 MIL/uL (ref 3.87–5.11)
RDW: 18.9 % — AB (ref 11.5–15.5)
WBC: 6.5 10*3/uL (ref 4.0–10.5)

## 2016-09-16 LAB — PROTIME-INR
INR: 1.03
PROTHROMBIN TIME: 13.5 s (ref 11.4–15.2)

## 2016-09-16 LAB — TROPONIN I: TROPONIN I: 0.05 ng/mL — AB (ref <0.03)

## 2016-09-16 MED ORDER — RENA-VITE PO TABS
1.0000 | ORAL_TABLET | Freq: Every day | ORAL | Status: DC
  Administered 2016-09-17 – 2016-09-20 (×5): 1 via ORAL
  Filled 2016-09-16 (×5): qty 1

## 2016-09-16 MED ORDER — VITAMIN D 1000 UNITS PO TABS: 1000.0000 [IU] | ORAL_TABLET | ORAL | Status: DC

## 2016-09-16 MED ORDER — CALCITRIOL 0.25 MCG PO CAPS
0.2500 ug | ORAL_CAPSULE | ORAL | Status: DC
  Administered 2016-09-16 – 2016-09-18 (×2): 0.25 ug via ORAL
  Filled 2016-09-16 (×2): qty 1

## 2016-09-16 MED ORDER — ALTEPLASE 2 MG IJ SOLR: 2.0000 mg | Freq: Once | INTRAMUSCULAR | Status: DC | PRN

## 2016-09-16 MED ORDER — CALCITRIOL 0.25 MCG PO CAPS
ORAL_CAPSULE | ORAL | Status: AC
  Administered 2016-09-16: 0.25 ug via ORAL
  Filled 2016-09-16: qty 1

## 2016-09-16 MED ORDER — METOPROLOL TARTRATE 12.5 MG HALF TABLET
12.5000 mg | ORAL_TABLET | Freq: Two times a day (BID) | ORAL | Status: DC
  Administered 2016-09-17 (×2): 12.5 mg via ORAL
  Filled 2016-09-16 (×5): qty 1

## 2016-09-16 MED ORDER — CALCITRIOL 0.25 MCG PO CAPS: 0.2500 ug | ORAL_CAPSULE | ORAL | Status: DC

## 2016-09-16 MED ORDER — MORPHINE SULFATE (PF) 4 MG/ML IV SOLN
1.0000 mg | INTRAVENOUS | Status: DC | PRN
  Administered 2016-09-17 – 2016-09-18 (×2): 1 mg via INTRAVENOUS
  Filled 2016-09-16 (×2): qty 1

## 2016-09-16 MED ORDER — SENNOSIDES-DOCUSATE SODIUM 8.6-50 MG PO TABS: 1.0000 | ORAL_TABLET | Freq: Every evening | ORAL | Status: DC | PRN

## 2016-09-16 MED ORDER — POLYETHYL GLYCOL-PROPYL GLYCOL 0.4-0.3 % OP GEL: 1.0000 "application " | Freq: Four times a day (QID) | OPHTHALMIC | Status: DC | PRN

## 2016-09-16 MED ORDER — AMLODIPINE BESYLATE 5 MG PO TABS
5.0000 mg | ORAL_TABLET | Freq: Every day | ORAL | Status: DC
  Administered 2016-09-17: 5 mg via ORAL
  Filled 2016-09-16: qty 1

## 2016-09-16 MED ORDER — SODIUM CHLORIDE 0.9 % IV SOLN: 100.0000 mL | INTRAVENOUS | Status: DC | PRN

## 2016-09-16 MED ORDER — CALCIUM CARBONATE ANTACID 1000 MG PO CHEW: 3000.0000 mg | CHEWABLE_TABLET | Freq: Three times a day (TID) | ORAL | Status: DC

## 2016-09-16 MED ORDER — ATORVASTATIN CALCIUM 80 MG PO TABS
80.0000 mg | ORAL_TABLET | Freq: Every day | ORAL | Status: DC
  Administered 2016-09-17 – 2016-09-20 (×5): 80 mg via ORAL
  Filled 2016-09-16 (×5): qty 1

## 2016-09-16 MED ORDER — PANTOPRAZOLE SODIUM 40 MG PO TBEC
40.0000 mg | DELAYED_RELEASE_TABLET | Freq: Two times a day (BID) | ORAL | Status: DC
  Administered 2016-09-17 – 2016-09-21 (×9): 40 mg via ORAL
  Filled 2016-09-16 (×10): qty 1

## 2016-09-16 MED ORDER — TRAMADOL HCL 50 MG PO TABS
50.0000 mg | ORAL_TABLET | Freq: Once | ORAL | Status: AC
  Administered 2016-09-16: 50 mg via ORAL
  Filled 2016-09-16: qty 1

## 2016-09-16 MED ORDER — ONDANSETRON HCL 4 MG/2ML IJ SOLN
4.0000 mg | Freq: Once | INTRAMUSCULAR | Status: DC
  Filled 2016-09-16: qty 2

## 2016-09-16 MED ORDER — LIDOCAINE HCL (PF) 1 % IJ SOLN: 5.0000 mL | INTRAMUSCULAR | Status: DC | PRN

## 2016-09-16 MED ORDER — ASPIRIN EC 81 MG PO TBEC
81.0000 mg | DELAYED_RELEASE_TABLET | Freq: Every day | ORAL | Status: DC
  Administered 2016-09-17: 81 mg via ORAL
  Filled 2016-09-16: qty 1

## 2016-09-16 MED ORDER — SODIUM CHLORIDE 0.9 % IV SOLN: INTRAVENOUS | Status: DC

## 2016-09-16 MED ORDER — HYDROCODONE-ACETAMINOPHEN 5-325 MG PO TABS
ORAL_TABLET | ORAL | Status: AC
  Administered 2016-09-16: 2 via ORAL
  Filled 2016-09-16: qty 2

## 2016-09-16 MED ORDER — SODIUM CHLORIDE 0.9 % IV SOLN
125.0000 mg | INTRAVENOUS | Status: DC
  Administered 2016-09-16 – 2016-09-18 (×2): 125 mg via INTRAVENOUS
  Filled 2016-09-16 (×4): qty 10

## 2016-09-16 MED ORDER — PENTAFLUOROPROP-TETRAFLUOROETH EX AERO: 1.0000 "application " | INHALATION_SPRAY | CUTANEOUS | Status: DC | PRN

## 2016-09-16 MED ORDER — SODIUM CHLORIDE 0.9 % IV SOLN
INTRAVENOUS | Status: DC
  Administered 2016-09-16: 14:00:00 via INTRAVENOUS

## 2016-09-16 MED ORDER — BISACODYL 5 MG PO TBEC
5.0000 mg | DELAYED_RELEASE_TABLET | Freq: Every day | ORAL | Status: DC | PRN
  Administered 2016-09-20 – 2016-09-21 (×2): 5 mg via ORAL
  Filled 2016-09-16 (×2): qty 1

## 2016-09-16 MED ORDER — HEPARIN SODIUM (PORCINE) 5000 UNIT/ML IJ SOLN
5000.0000 [IU] | Freq: Three times a day (TID) | INTRAMUSCULAR | Status: DC
  Administered 2016-09-17 – 2016-09-21 (×12): 5000 [IU] via SUBCUTANEOUS
  Filled 2016-09-16 (×13): qty 1

## 2016-09-16 MED ORDER — LIDOCAINE-PRILOCAINE 2.5-2.5 % EX CREA: 1.0000 "application " | TOPICAL_CREAM | CUTANEOUS | Status: DC | PRN

## 2016-09-16 MED ORDER — MORPHINE SULFATE (PF) 4 MG/ML IV SOLN
4.0000 mg | Freq: Once | INTRAVENOUS | Status: DC
  Filled 2016-09-16: qty 1

## 2016-09-16 MED ORDER — CALCIUM CARBONATE 1250 (500 CA) MG PO TABS
3000.0000 mg | ORAL_TABLET | Freq: Three times a day (TID) | ORAL | Status: DC
Start: 2016-09-17 — End: 2016-09-21
  Administered 2016-09-17 – 2016-09-21 (×10): 3125 mg via ORAL
  Filled 2016-09-16 (×2): qty 3
  Filled 2016-09-16: qty 2.5
  Filled 2016-09-16 (×9): qty 3

## 2016-09-16 MED ORDER — FLEET ENEMA 7-19 GM/118ML RE ENEM
1.0000 | ENEMA | Freq: Once | RECTAL | Status: AC | PRN
  Administered 2016-09-21: 1 via RECTAL
  Filled 2016-09-16: qty 1

## 2016-09-16 MED ORDER — PREDNISONE 20 MG PO TABS
40.0000 mg | ORAL_TABLET | Freq: Once | ORAL | Status: AC
  Administered 2016-09-16: 40 mg via ORAL
  Filled 2016-09-16: qty 2

## 2016-09-16 MED ORDER — HYDROCODONE-ACETAMINOPHEN 5-325 MG PO TABS
1.0000 | ORAL_TABLET | Freq: Four times a day (QID) | ORAL | Status: DC | PRN
  Administered 2016-09-16 – 2016-09-17 (×2): 2 via ORAL
  Administered 2016-09-17 (×2): 1 via ORAL
  Filled 2016-09-16: qty 1
  Filled 2016-09-16: qty 2
  Filled 2016-09-16: qty 1

## 2016-09-16 MED ORDER — HEPARIN SODIUM (PORCINE) 1000 UNIT/ML DIALYSIS: 1000.0000 [IU] | INTRAMUSCULAR | Status: DC | PRN

## 2016-09-16 MED ORDER — SODIUM CHLORIDE 0.9 % IV SOLN
INTRAVENOUS | Status: DC
  Administered 2016-09-17: 01:00:00 via INTRAVENOUS

## 2016-09-16 NOTE — Progress Notes (Signed)
Dialysis treatment completed.  2500 mL ultrafiltrated and net fluid removal 2000 mL.    Patient status unchanged. Lung sounds diminished to ausculation in all fields. No edema. Cardiac: NSR.  Disconnected lines and removed needles.  Pressure held for 10 minutes and band aid/gauze dressing applied.  Report given to bedside RN, Marolyn Hammock.

## 2016-09-16 NOTE — Consult Note (Signed)
 [facility] Renal Consultation Note    Indication for Consultation:  Management of ESRD/hemodialysis; anemia, hypertension/volume and secondary hyperparathyroidism PCP: [name], MD   HPI: [name] is a 83 y.o. female with ESRD on hemodialysis T,Th,S at [facility]. 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   Patient has been C/O L hip pain which was diagnosed as bursitis. Had affected her lifestyle in such a fashion that she had to change dialysis schedule as she could no longer drive herself to dialysis. Apparently today she was getting ready to go to dialysis, she sat down and when she stood she experienced sharp pain L hip. She presented to ED for evaluation. Xray of hip shows Left femoral neck fracture with mild varus angulation appears acute versus subacute. Orthopedic PA at bedside, said it is possible she had previous stress fracture of that hip that was causing pain but was not visible on Xray. Plans are being made for surgery, probably tomorrow. Labs unremarkable. K+ 4.3 HGB 11.2 WBC  6.5 PLT 231. CXR shows Stable cardiomegaly and central pulmonary vascular congestion. Currently she has no complaints of pain, only asking for something to eat. She has no swelling, external or internal rotation of L hip She rec'd pain meds/prednisone in ED.  Patient is an excellent HD patient, does not miss treatments, always with a positive attitude. Last HD 09/14/16, left 0.2 above EDW. Anemia and BMD labs have been WNL. She is a rather extraordinary,outgoing person who has been active socially in spite of L. Hip pain.     Past Medical History:  Diagnosis Date  . Anemia in chronic kidney disease    IV iron infusion and anemia  from GAVE blood loss.  . Arthritis     hands  . Bradycardia 2014   a. Noted 06/2012.  . Breast cancer ([place])    Left Breast 13 yrs. ago, RADIATION TX DONE  . CAD (coronary artery disease), native coronary artery    a. 03/2015 Cath: LAD 83m, D2 75, LCX 80, OM1 85, RI 99, RCA 70p/m, 95d, AM 85-->poor candidate for CABG & PCI-->Med Rx.  . Cardiac murmur    a. thought due to AVF (2D echo 06/2012 without significant valvular disease).   . Carotid arterial disease ([place])    a. 04/2015 Carotid U/S: 1-39% bilat ICA stenosis.  Marland Kitchen Ectropion of right lower eyelid 11/2012   s/p surgery (Dr. [name])  . End stage renal disease on dialysis [facility])    HD M,W,F [place] AVF now LUE AVF HD X 4 YRS   . Facial droop 1935   acquired during forceps delivery, some residual R visual loss  . Family history of adverse reaction to anesthesia    brother trouble waking up and confusion, now deceased  . GAVE (gastric antral vascular ectasia) 08/2014   s/p EGD with APC  . GERD (gastroesophageal reflux disease)   . Hearing loss    Bilateral hearing aids  . History of blood transfusion 5 weeks ago  . History of home oxygen therapy    2 LITERS  OXYGEN PRN WHEN WALKING  . Hx of breast cancer 2004  . Hyperlipidemia   . Hypertension   . Hypotension    a. Associated w/ dialysis.  . Moderate mitral regurgitation  a. 05/2015 Echo: EF 55-60%, no rwma, triv AI, Ao sclerosis, mod MR, mildly to mod dil LA, PASP 66mmHg.  . Osteoporosis 11/2013   DEXA -3.4 radius IN BACK  . Sight impaired    Left: wears contact. Right: diminished peripheral vision   Past Surgical History:  Procedure Laterality Date  . AV FISTULA PLACEMENT  07/20/10   Right brachiocephalic AVF  . BASCILIC VEIN TRANSPOSITION Left 07/24/2013   Procedure: BASILIC VEIN TRANSPOSITION;  Surgeon: [name], MD;  Location: [place];  Service: Vascular;  Laterality: Left;  . BREAST BIOPSY Left   . BREAST LUMPECTOMY Left   . CARDIAC  CATHETERIZATION N/A 04/10/2015   Procedure: Left Heart Cath and Coronary Angiography;  Surgeon: [name], MD;  Location: [place] [facility];  Service: Cardiovascular;  Laterality: N/A;  . CATARACT EXTRACTION Bilateral 1980  . DEXA  11/2015   DEXA T -4.3 [facility]  . ESOPHAGOGASTRODUODENOSCOPY N/A 04/09/2014   Procedure: ESOPHAGOGASTRODUODENOSCOPY (EGD);  Surgeon: [name], MD;  Location: [place];  Service: Endoscopy;  Laterality: N/A;  . ESOPHAGOGASTRODUODENOSCOPY N/A 09/23/2014   Procedure: ESOPHAGOGASTRODUODENOSCOPY (EGD);  Surgeon: [name], MD;  Location: [place];  Service: Endoscopy;  Laterality: N/A;  . ESOPHAGOGASTRODUODENOSCOPY N/A 12/26/2014   neg H pylori, benign biopsies; ESOPHAGOGASTRODUODENOSCOPY (EGD);  Surgeon: [name], MD  . ESOPHAGOGASTRODUODENOSCOPY N/A 06/15/2015   Procedure: ESOPHAGOGASTRODUODENOSCOPY (EGD);  Surgeon: Milus Banister, MD;  Location: [place];  Service: Endoscopy;  Laterality: N/A;  . ESOPHAGOGASTRODUODENOSCOPY N/A 06/27/2015   Procedure: ESOPHAGOGASTRODUODENOSCOPY (EGD);  Surgeon: Mauri Pole, MD;  Location[facility];  Service: Endoscopy;  Laterality: N/A;  . ESOPHAGOGASTRODUODENOSCOPY (EGD) WITH PROPOFOL N/A 11/12/2014   Procedure: ESOPHAGOGASTRODUODENOSCOPY (EGD) WITH PROPOFOL;  Surgeon: [name], MD;  Location: [facility];  Service: Endoscopy;  Laterality: N/A;  . ESOPHAGOGASTRODUODENOSCOPY (EGD) WITH PROPOFOL N/A 07/31/2015   Procedure: ESOPHAGOGASTRODUODENOSCOPY (EGD) WITH PROPOFOL;  Surgeon: Mauri Pole, MD;  Location: [facility];  Service: Endoscopy;  Laterality: N/A;  . ESOPHAGOGASTRODUODENOSCOPY (EGD) WITH PROPOFOL N/A 03/08/2016   Procedure: ESOPHAGOGASTRODUODENOSCOPY (EGD) WITH PROPOFOL;  Surgeon: Mauri Pole, MD;  Location: [facility];  Service: Endoscopy;  Laterality: N/A;  . ESOPHAGOGASTRODUODENOSCOPY (EGD) WITH PROPOFOL N/A 07/01/2016   Procedure: ESOPHAGOGASTRODUODENOSCOPY (EGD)  WITH PROPOFOL;  Surgeon: Mauri Pole, MD;  Location: [facility];  Service: Endoscopy;  Laterality: N/A;  . EYE SURGERY  1966   regular cataract  . HERNIA REPAIR    . HOT HEMOSTASIS N/A 11/12/2014   Procedure: HOT HEMOSTASIS (ARGON PLASMA COAGULATION/BICAP);  Surgeon: [name], MD;  Location: [facility];  Service: Endoscopy;  Laterality: N/A;  . HOT HEMOSTASIS N/A 06/27/2015   Procedure: HOT HEMOSTASIS (ARGON PLASMA COAGULATION/BICAP);  Surgeon: Mauri Pole, MD;  Location: [facility];  Service: Endoscopy;  Laterality: N/A;  . HOT HEMOSTASIS N/A 07/31/2015   Procedure: HOT HEMOSTASIS (ARGON PLASMA COAGULATION/BICAP);  Surgeon: Mauri Pole, MD;  Location: [facility];  Service: Endoscopy;  Laterality: N/A;  . HOT HEMOSTASIS N/A 07/01/2016   Procedure: HOT HEMOSTASIS (ARGON PLASMA COAGULATION/BICAP);  Surgeon: Mauri Pole, MD;  Location: [facility];  Service: Endoscopy;  Laterality: N/A;  . PERIPHERAL VASCULAR CATHETERIZATION N/A 08/15/2014   Procedure: A/V Shuntogram/Fistulagram;  Surgeon: [name], MD;  Location: [place] [facility];  Service: Cardiovascular;  Laterality: N/A;  . PERIPHERAL VASCULAR CATHETERIZATION N/A 04/28/2015   Procedure: A/V Shuntogram/Fistulagram;  Surgeon: [name], MD;  Location: [place] [facility];  Service: Cardiovascular;  Laterality: N/A;  . PERIPHERAL VASCULAR CATHETERIZATION Left 04/28/2015   Procedure: A/V Shunt Intervention;  Surgeon: [name], MD;  Location: [place] [facility];  Service: Cardiovascular;  Laterality: Left;  . PERIPHERAL VASCULAR CATHETERIZATION Left 08/07/2015   Procedure: A/V Shuntogram/Fistulagram;  Surgeon: [name], MD;  Location: [place] [facility];  Service: Cardiovascular;  Laterality: Left;  . PERIPHERAL VASCULAR CATHETERIZATION N/A 08/07/2015   Procedure: A/V Shunt Intervention;  Surgeon: [name], MD;  Location: [place] [facility];  Service: Cardiovascular;  Laterality: N/A;  .  PERIPHERAL VASCULAR CATHETERIZATION Left 11/03/2015   Procedure: A/V Shuntogram/Fistulagram;  Surgeon: [name], MD;  Location: [place] [facility];  Service: Cardiovascular;  Laterality: Left;  . PERIPHERAL VASCULAR CATHETERIZATION N/A 11/03/2015   Procedure: A/V Shunt Intervention;  Surgeon: [name], MD;  Location: [place] [facility];  Service: Cardiovascular;  Laterality: N/A;  . PERIPHERAL VASCULAR CATHETERIZATION N/A 03/19/2016   Procedure: A/V Fistulagram;  Surgeon: Katha Cabal, MD;  Location: [place] [facility];  Service: Cardiovascular;  Laterality: N/A;  . PERIPHERAL VASCULAR CATHETERIZATION N/A 03/30/2016   Procedure: A/V Fistulagram;  Surgeon: Katha Cabal, MD;  Location: [place] [facility];  Service: Cardiovascular;  Laterality: N/A;  . PERIPHERAL VASCULAR CATHETERIZATION N/A 03/30/2016   Procedure: A/V Shunt Intervention;  Surgeon: Katha Cabal, MD;  Location: [place] [facility];  Service: Cardiovascular;  Laterality: N/A;  . SHUNTOGRAM N/A 04/16/2011   Procedure: [place];  Surgeon: [name], MD;  Location: [facility] CATH LAB;  Service: Cardiovascular;  Laterality: N/A;  . SHUNTOGRAM Right 07/05/2013   Procedure: FISTULOGRAM;  Surgeon: [name], MD;  Location: [facility] CATH LAB;  Service: Cardiovascular;  Laterality: Right;  . UMBILICAL HERNIA REPAIR  2011  . US ECHOCARDIOGRAPHY  06/2012   LVH, EF 65%, grade 1 diastol dysfunction, mod dliated LAD   Family History  Problem Relation Age of Onset  . Cancer Father        stomach  . CAD Father        MI at age 27  . Heart disease Father   . Heart attack Father   . Hypertension Mother   . Heart disease Brother        Congenital  . Hypertension Brother   . Diabetes Brother   . Cancer Sister        female (uterus?)  . Colon cancer Neg Hx   . Esophageal cancer Neg Hx   . Stomach cancer Neg Hx    Social History:  reports that she quit smoking about 63 years ago. Her smoking use included Cigarettes.  She quit after 1.00 year of use. She has never used smokeless tobacco. She reports that she drinks alcohol. She reports that she does not use drugs. Allergies  Allergen Reactions  . Sulfa Antibiotics Rash and Other (See Comments)    fever   Prior to Admission medications   Medication Sig Start Date End Date Taking? Authorizing Provider  acetaminophen (TYLENOL) 500 MG tablet Take 500 mg by mouth daily as needed for moderate pain.     [provider]  amLODipine (NORVASC) 5 MG tablet Take 1 tablet (5 mg total) by mouth at bedtime. 06/01/16   Lelon Perla, MD  aspirin EC 81 MG tablet Take 1 tablet (81 mg total) by mouth daily. 03/06/15   Lelon Perla, MD  atorvastatin (LIPITOR) 80 MG tablet Take 1 tablet (80 mg total) by mouth at  bedtime. 06/01/16   Lelon Perla, MD  B Complex-C-Folic Acid (RENA-VITE PO) Take 1 tablet by mouth daily with breakfast.     [provider]  Calcium Carbonate Antacid (TUMS ULTRA 1000 PO) Take 3,000 mg by mouth 3 (three) times daily after meals.     [provider]  cholecalciferol (VITAMIN D) 1000 UNITS tablet Take 1,000 Units by mouth every Monday, Wednesday, and Friday.     [provider]  metoprolol tartrate (LOPRESSOR) 25 MG tablet Take 1 tablet (25 mg total) by mouth 2 (two) times daily. Patient taking differently: Take 12.5 mg by mouth 2 (two) times daily. TAKE 1/2 TABLET (12.5 MG) IN THE MORNING AND 1/2 TABLET (12.5 MG) AT NIGHT. 06/01/16   Lelon Perla, MD  NON FORMULARY Oxygen - 2 liters as needed    [provider]  ondansetron (ZOFRAN) 4 MG tablet Take 1 tablet (4 mg total) by mouth every 8 (eight) hours as needed for nausea or vomiting. 06/23/16   [name], MD  pantoprazole (PROTONIX) 40 MG tablet TAKE 1 TABLET BY MOUTH TWICE A DAY Patient taking differently: take 80mg s in the morning 03/15/16   Mauri Pole, MD  Polyethyl Glycol-Propyl Glycol (SYSTANE) 0.4-0.3 % GEL ophthalmic gel  Place 1 application into the right eye every 6 (six) hours as needed (eye dryness or pain).    [provider]  traMADol (ULTRAM) 50 MG tablet Take 0.5 tablets (25 mg total) by mouth 2 (two) times daily as needed for moderate pain. 07/27/16   [name], MD   Current Facility-Administered Medications  Medication Dose Route Frequency Provider Last Rate Last Dose  . 0.9 %  sodium chloride infusion   Intravenous Continuous Lajean Saver, MD 20 mL/hr at 09/16/16 1350    . morphine 4 MG/ML injection 4 mg  4 mg Intravenous Once Lajean Saver, MD   Stopped at 09/16/16 1323  . ondansetron (ZOFRAN) injection 4 mg  4 mg Intravenous Once Lajean Saver, MD   Stopped at 09/16/16 1323   Current Outpatient Prescriptions  Medication Sig Dispense Refill  . acetaminophen (TYLENOL) 500 MG tablet Take 500 mg by mouth daily as needed for moderate pain.     Marland Kitchen amLODipine (NORVASC) 5 MG tablet Take 1 tablet (5 mg total) by mouth at bedtime. 90 tablet 3  . aspirin EC 81 MG tablet Take 1 tablet (81 mg total) by mouth daily. 90 tablet 3  . atorvastatin (LIPITOR) 80 MG tablet Take 1 tablet (80 mg total) by mouth at bedtime. 90 tablet 3  . B Complex-C-Folic Acid (RENA-VITE PO) Take 1 tablet by mouth daily with breakfast.     . Calcium Carbonate Antacid (TUMS ULTRA 1000 PO) Take 3,000 mg by mouth 3 (three) times daily after meals.     . cholecalciferol (VITAMIN D) 1000 UNITS tablet Take 1,000 Units by mouth every Monday, Wednesday, and Friday.     . metoprolol tartrate (LOPRESSOR) 25 MG tablet Take 1 tablet (25 mg total) by mouth 2 (two) times daily. (Patient taking differently: Take 12.5 mg by mouth 2 (two) times daily. TAKE 1/2 TABLET (12.5 MG) IN THE MORNING AND 1/2 TABLET (12.5 MG) AT NIGHT.) 180 tablet 3  . NON FORMULARY Oxygen - 2 liters as needed    . ondansetron (ZOFRAN) 4 MG tablet Take 1 tablet (4 mg total) by mouth every 8 (eight) hours as needed for nausea or vomiting. 20 tablet 0  . pantoprazole  (PROTONIX) 40 MG tablet TAKE  1 TABLET BY MOUTH TWICE A DAY (Patient taking differently: take 80mg s in the morning) 60 tablet 5  . Polyethyl Glycol-Propyl Glycol (SYSTANE) 0.4-0.3 % GEL ophthalmic gel Place 1 application into the right eye every 6 (six) hours as needed (eye dryness or pain).    . traMADol (ULTRAM) 50 MG tablet Take 0.5 tablets (25 mg total) by mouth 2 (two) times daily as needed for moderate pain. 20 tablet 0   Labs: Basic Metabolic Panel:  Recent Labs Lab 09/16/16 1419  NA 139  K 4.3  CL 96*  CO2 25  GLUCOSE 119*  BUN 58*  CREATININE 7.07*  CALCIUM 8.9   Liver Function Tests: No results for input(s): AST, ALT, ALKPHOS, BILITOT, PROT, ALBUMIN in the last 168 hours. No results for input(s): LIPASE, AMYLASE in the last 168 hours. No results for input(s): AMMONIA in the last 168 hours. CBC:  Recent Labs Lab 09/16/16 1419  WBC 6.5  HGB 11.2*  HCT 34.8*  MCV 89.2  PLT 231   Studies/Results: Dg Chest Port 1 View  Result Date: 09/16/2016 CLINICAL DATA:  Preop for left hip fracture repair. EXAM: PORTABLE CHEST 1 VIEW COMPARISON:  Radiographs of June 16, 2016. FINDINGS: Stable cardiomegaly and central pulmonary vascular congestion. Atherosclerosis of thoracic aorta is noted. No pneumothorax or pleural effusion is noted. Stable elevated right hemidiaphragm is noted. No consolidative process is noted. Bony thorax is unremarkable. IMPRESSION: Stable cardiomegaly and central pulmonary vascular congestion. Aortic atherosclerosis. No consolidative process is noted. Electronically Signed   By: Marijo Conception, M.D.   On: 09/16/2016 14:17   Dg Hip Unilat W Or W/o Pelvis 2-3 Views Left  Result Date: 09/16/2016 CLINICAL DATA:  83 year old female with 6 weeks of left hip pain, no known injury. EXAM: DG HIP (WITH OR WITHOUT PELVIS) 2-3V LEFT COMPARISON:  PET-CT 07/26/2005 FINDINGS: Aortoiliac calcified atherosclerosis. Bilateral calcified femoral artery atherosclerosis.  Osteopenia. The pelvis appears intact. Grossly intact proximal right femur. The left femoral head is normally located. There is a left femoral neck fracture with mild varus angulation, no other displacement. The proximal left femur intertrochanteric segment appears to remain intact. Proximal left femoral shaft is intact. IMPRESSION: 1. Left femoral neck fracture with mild varus angulation appears acute versus subacute. 2. Osteopenia.  No other acute osseous abnormality identified. 3. Calcified aortic and iliofemoral atherosclerosis. Electronically Signed   By: Genevie Ann M.D.   On: 09/16/2016 12:50    ROS: As per HPI otherwise negative.   Physical Exam: Vitals:   09/16/16 1353 09/16/16 1400 09/16/16 1415 09/16/16 1430  BP: (!) 173/75 (!) 169/75 (!) 167/74 (!) 148/73  Pulse: 80 83 84 84  Resp: 16     Temp:      TempSrc:      SpO2: 95% 90% 90% (!) 86%     General: Pleasant elderly female in NAD Head: Normocephalic, atraumatic, sclera non-icteric, mucus membranes are moist Neck: Supple. JVD not elevated. Lungs: Clear bilaterally to auscultation without wheezes, rales, or rhonchi. Breathing is unlabored. Heart: RRR with S1 S2 2/6 systolic M.  Abdomen: Soft, non-tender, non-distended with normoactive bowel sounds. No rebound/guarding. No obvious abdominal masses. M-S:  Strength and tone appear normal for age. Lower extremities:without edema or ischemic changes, no open wounds. Tenderness L hip, no swelling, no internal or external rotation of LLE.  Neuro: Alert and oriented X 3. CN II-XII grossly intact. Chronic facial drooping d/t birth injury. Psych:  Responds to questions appropriately with a normal affect. Dialysis  Access: LUA AVF + bruit  Dialysis Orders: [facility], T,Th,S 3 hrs. 160 NRe 350/600 49.5 kg 2.0 K/2.0 Ca  Linear Na/UF Profile 4 -No Heparin -Mircera 50 mcg IV Q 4 weeks (last dose 09/11/16 HGB 10.5 09/14/16 ) -Venofer 100 mg IV X 10 doses (has not been started  yet-Fe 71 Tsat 27% Ferritin 169 09/08/16) -Calcitriol 0.25 mcg PO TIW (Last PTH 197 09/08/16)  BMD meds: TUMS 3000 mg PO TID AC (Last Ca 9.4 C Ca 9.6 phos 4.2 09/08/16)   Assessment/Plan: 1. L Hip Fracture: Orthopedic consult. Probably repair tomorrow. Stable and comfortable at present. 2.  ESRD -  T,TH,S via LUA AVF. K+ 4.3. HD today.  3.  Hypertension/volume  - Metoprolol 12.5 mg PO BID, Amlodipine 10 mg PO Q HS on OP med list. Vascular congestion noted on CXR, does not sound or look wet. Will challenge 0.5 and lower EDW slightly as tolerated.  4.  Anemia  - HGB 11.2. Recent OP ESA dose. Follow HGB. Current order for Fe load appears excessive for Tsat 27%-change to venofer 100 mg IV X 3 doses.  5.  Metabolic bone disease -  Cont binders, VDRA. 6.  Nutrition - Renal diet, renal vit/nepro 7. H/O multi-vessel CAD: No chest pain reported.  8. H/O GI bleed: HGB stable, no recent issues.    H. Owens Shark, NP-C 09/16/2016, 2:50 PM  [employer] [phone]

## 2016-09-16 NOTE — ED Notes (Signed)
Patient placed on 2L O2 n.c. Due to O2 sat in 80's, O2 sat up to mid 90's

## 2016-09-16 NOTE — ED Notes (Signed)
Surgical Provider at bedside. 

## 2016-09-16 NOTE — H&P (Signed)
History and Physical    [name] ZLD:[id number] DOB: 03/01/1932 DOA: 09/16/2016   PCP: [name], MD   Patient coming from:  Home    Chief Complaint: left hip pain  HPI: [name] is a 83 y.o. female with  Extensive medical history including  ESRD on hemodialysis Tuesday Thursday Saturday,  Osteoporosis, hypertension, hyperlipidemia, CAD, chronic anemia, presenting with one to 2 months history of left hip pain, initially diagnosed with bursitis. She had a progressive decline of ambulation, and increase left heap sharp pain, moderate severe, worse with weight bearing, and trying to walk. She denies any recent falls, or trauma. The patient denies any radicular pain on the left leg. She denies any back pain. She denies any leg numbness, weakness, or swelling.She never had a hip repair before.  She denies any bowel incontinence or retention. The patient makes very little urine. She denies any rashes. No fever or chills.   ED Course:  BP (!) 155/78   Pulse 87   Temp 97.7 F (36.5 C) (Oral)   Resp 16   SpO2 99%    Sodium 139 potassium 4.3 bicarb 25 glucose 119 creatinine 7.07 calcium 8.9  white count 6.5 hemoglobin 11.2 platelets 231 PT 13.5 INR 1.03  troponin and EKG are pending. Urinalysis pending (patient is still making minimal urine)  CXR Stable cardiomegaly and central pulmonary vascular congestion.Aortic atherosclerosis. No consolidative process is noted left hip x-ray Left femoral neck fracture with mild varus angulation appears acute versus subacute. Last 2-D echo with EF 65% - 70, figure rose systolic function  Review of Systems:  As per HPI otherwise all other systems reviewed and are negative  Past Medical History:  Diagnosis Date  . Anemia in chronic kidney disease    IV iron infusion and anemia from GAVE blood loss.  . Arthritis     hands  . Bradycardia 2014   a. Noted 06/2012.  . Breast cancer ([place])    Left Breast 13 yrs. ago, RADIATION TX DONE  . CAD  (coronary artery disease), native coronary artery    a. 03/2015 Cath: LAD 44m, D2 75, LCX 80, OM1 85, RI 99, RCA 70p/m, 95d, AM 85-->poor candidate for CABG & PCI-->Med Rx.  . Cardiac murmur    a. thought due to AVF (2D echo 06/2012 without significant valvular disease).   . Carotid arterial disease ([place])    a. 04/2015 Carotid U/S: 1-39% bilat ICA stenosis.  Marland Kitchen Ectropion of right lower eyelid 11/2012   s/p surgery (Dr. [name])  . End stage renal disease on dialysis [facility])    HD M,W,F [place] AVF now LUE AVF HD X 4 YRS   . Facial droop 1935   acquired during forceps delivery, some residual R visual loss  . Family history of adverse reaction to anesthesia    brother trouble waking up and confusion, now deceased  . GAVE (gastric antral vascular ectasia) 08/2014   s/p EGD with APC  . GERD (gastroesophageal reflux disease)   . Hearing loss    Bilateral hearing aids  . History of blood transfusion 5 weeks ago  . History of home oxygen therapy    2 LITERS  OXYGEN PRN WHEN WALKING  . Hx of breast cancer 2004  . Hyperlipidemia   . Hypertension   . Hypotension    a. Associated w/ dialysis.  . Moderate mitral regurgitation    a. 05/2015 Echo: EF 55-60%, no rwma, triv AI, Ao sclerosis, mod  MR, mildly to mod dil LA, PASP 56mmHg.  . Osteoporosis 11/2013   DEXA -3.4 radius IN BACK  . Sight impaired    Left: wears contact. Right: diminished peripheral vision    Past Surgical History:  Procedure Laterality Date  . AV FISTULA PLACEMENT  07/20/10   Right brachiocephalic AVF  . BASCILIC VEIN TRANSPOSITION Left 07/24/2013   Procedure: BASILIC VEIN TRANSPOSITION;  Surgeon: [name], MD;  Location: [place];  Service: Vascular;  Laterality: Left;  . BREAST BIOPSY Left   . BREAST LUMPECTOMY Left   . CARDIAC CATHETERIZATION N/A 04/10/2015   Procedure: Left Heart Cath and Coronary Angiography;  Surgeon: [name], MD;  Location: [place] [facility];  Service: Cardiovascular;   Laterality: N/A;  . CATARACT EXTRACTION Bilateral 1980  . DEXA  11/2015   DEXA T -4.3 [facility]  . ESOPHAGOGASTRODUODENOSCOPY N/A 04/09/2014   Procedure: ESOPHAGOGASTRODUODENOSCOPY (EGD);  Surgeon: [name], MD;  Location: [place];  Service: Endoscopy;  Laterality: N/A;  . ESOPHAGOGASTRODUODENOSCOPY N/A 09/23/2014   Procedure: ESOPHAGOGASTRODUODENOSCOPY (EGD);  Surgeon: [name], MD;  Location: [place];  Service: Endoscopy;  Laterality: N/A;  . ESOPHAGOGASTRODUODENOSCOPY N/A 12/26/2014   neg H pylori, benign biopsies; ESOPHAGOGASTRODUODENOSCOPY (EGD);  Surgeon: [name], MD  . ESOPHAGOGASTRODUODENOSCOPY N/A 06/15/2015   Procedure: ESOPHAGOGASTRODUODENOSCOPY (EGD);  Surgeon: Milus Banister, MD;  Location: [name];  Service: Endoscopy;  Laterality: N/A;  . ESOPHAGOGASTRODUODENOSCOPY N/A 06/27/2015   Procedure: ESOPHAGOGASTRODUODENOSCOPY (EGD);  Surgeon: Mauri Pole, MD;  Location[facility];  Service: Endoscopy;  Laterality: N/A;  . ESOPHAGOGASTRODUODENOSCOPY (EGD) WITH PROPOFOL N/A 11/12/2014   Procedure: ESOPHAGOGASTRODUODENOSCOPY (EGD) WITH PROPOFOL;  Surgeon: [name], MD;  Location: [facility];  Service: Endoscopy;  Laterality: N/A;  . ESOPHAGOGASTRODUODENOSCOPY (EGD) WITH PROPOFOL N/A 07/31/2015   Procedure: ESOPHAGOGASTRODUODENOSCOPY (EGD) WITH PROPOFOL;  Surgeon: Mauri Pole, MD;  Location: [facility];  Service: Endoscopy;  Laterality: N/A;  . ESOPHAGOGASTRODUODENOSCOPY (EGD) WITH PROPOFOL N/A 03/08/2016   Procedure: ESOPHAGOGASTRODUODENOSCOPY (EGD) WITH PROPOFOL;  Surgeon: Mauri Pole, MD;  Location: [facility];  Service: Endoscopy;  Laterality: N/A;  . ESOPHAGOGASTRODUODENOSCOPY (EGD) WITH PROPOFOL N/A 07/01/2016   Procedure: ESOPHAGOGASTRODUODENOSCOPY (EGD) WITH PROPOFOL;  Surgeon: Mauri Pole, MD;  Location[facility];  Service: Endoscopy;  Laterality: N/A;  . EYE SURGERY  1966   regular cataract  . HERNIA REPAIR     . HOT HEMOSTASIS N/A 11/12/2014   Procedure: HOT HEMOSTASIS (ARGON PLASMA COAGULATION/BICAP);  Surgeon: [name], MD;  Location: [facility];  Service: Endoscopy;  Laterality: N/A;  . HOT HEMOSTASIS N/A 06/27/2015   Procedure: HOT HEMOSTASIS (ARGON PLASMA COAGULATION/BICAP);  Surgeon: Mauri Pole, MD;  Location[facility];  Service: Endoscopy;  Laterality: N/A;  . HOT HEMOSTASIS N/A 07/31/2015   Procedure: HOT HEMOSTASIS (ARGON PLASMA COAGULATION/BICAP);  Surgeon: Mauri Pole, MD;  Location: [facility];  Service: Endoscopy;  Laterality: N/A;  . HOT HEMOSTASIS N/A 07/01/2016   Procedure: HOT HEMOSTASIS (ARGON PLASMA COAGULATION/BICAP);  Surgeon: Mauri Pole, MD;  Location[facility];  Service: Endoscopy;  Laterality: N/A;  . PERIPHERAL VASCULAR CATHETERIZATION N/A 08/15/2014   Procedure: A/V Shuntogram/Fistulagram;  Surgeon: [name], MD;  Location: [place] [facility];  Service: Cardiovascular;  Laterality: N/A;  . PERIPHERAL VASCULAR CATHETERIZATION N/A 04/28/2015   Procedure: A/V Shuntogram/Fistulagram;  Surgeon: [name], MD;  Location: [place] [facility];  Service: Cardiovascular;  Laterality: N/A;  . PERIPHERAL VASCULAR CATHETERIZATION Left 04/28/2015  Procedure: A/V Shunt Intervention;  Surgeon: [name], MD;  Location: [place] [facility];  Service: Cardiovascular;  Laterality: Left;  . PERIPHERAL VASCULAR CATHETERIZATION Left 08/07/2015   Procedure: A/V Shuntogram/Fistulagram;  Surgeon: [name], MD;  Location: [place] [facility];  Service: Cardiovascular;  Laterality: Left;  . PERIPHERAL VASCULAR CATHETERIZATION N/A 08/07/2015   Procedure: A/V Shunt Intervention;  Surgeon: [name], MD;  Location: [place] [facility];  Service: Cardiovascular;  Laterality: N/A;  . PERIPHERAL VASCULAR CATHETERIZATION Left 11/03/2015   Procedure: A/V Shuntogram/Fistulagram;  Surgeon: [name], MD;  Location: [place] [facility];  Service: Cardiovascular;   Laterality: Left;  . PERIPHERAL VASCULAR CATHETERIZATION N/A 11/03/2015   Procedure: A/V Shunt Intervention;  Surgeon: [name], MD;  Location: [place] [facility];  Service: Cardiovascular;  Laterality: N/A;  . PERIPHERAL VASCULAR CATHETERIZATION N/A 03/19/2016   Procedure: A/V Fistulagram;  Surgeon: Katha Cabal, MD;  Location: [place] [facility];  Service: Cardiovascular;  Laterality: N/A;  . PERIPHERAL VASCULAR CATHETERIZATION N/A 03/30/2016   Procedure: A/V Fistulagram;  Surgeon: Katha Cabal, MD;  Location: [place] [facility];  Service: Cardiovascular;  Laterality: N/A;  . PERIPHERAL VASCULAR CATHETERIZATION N/A 03/30/2016   Procedure: A/V Shunt Intervention;  Surgeon: Katha Cabal, MD;  Location: [place] [facility];  Service: Cardiovascular;  Laterality: N/A;  . SHUNTOGRAM N/A 04/16/2011   Procedure: [place] Mal[place];  Surgeon: [name], MD;  Location: [facility] LAB;  Service: Cardiovascular;  Laterality: N/A;  . SHUNTOGRAM Right 07/05/2013   Procedure: FISTULOGRAM;  Surgeon: [name], MD;  [facility] C[facility] LAB;  Service: Cardiovascular;  Laterality: Right;  . UMBILICAL HERNIA REPAIR  2011  . US ECHOCARDIOGRAPHY  06/2012   LVH, EF 65%, grade 1 diastol dysfunction, mod dliated LAD    Social History Social History   Social History  . Marital status: Single    Spouse name: N/A  . Number of children: N/A  . Years of education: N/A   Occupational History  . 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      retired   Social History Main Topics  . Smoking status: Former Smoker    Years: 1.00    Types: Cigarettes    Quit date: 03/29/1953  . Smokeless tobacco: Never Used  . Alcohol use 0.0 oz/week     Comment: occasional  . Drug use: No  . Sexual activity: No   Other Topics Concern  . Not on file   Social History Narrative   As of 03/2013. Lives alone with her small dog.  Bother and sister in law died last year.     Occupation:  retired, was Information systems manager for [employer]   Edu: some college     Allergies  Allergen Reactions  . Sulfa Antibiotics Rash and Other (See Comments)    fever    Family History  Problem Relation Age of Onset  . Cancer Father        stomach  . CAD Father        MI at age 51  . Heart disease Father   . Heart attack Father   . Hypertension Mother   . Heart disease Brother        Congenital  . Hypertension Brother   . Diabetes Brother   . Cancer Sister        female (uterus?)  . Colon cancer Neg Hx   . Esophageal cancer  Neg Hx   . Stomach cancer Neg Hx       Prior to Admission medications   Medication Sig Start Date End Date Taking? Authorizing Provider  amLODipine (NORVASC) 5 MG tablet Take 1 tablet (5 mg total) by mouth at bedtime. 06/01/16  Yes Lelon Perla, MD  aspirin EC 81 MG tablet Take 1 tablet (81 mg total) by mouth daily. 03/06/15  Yes Lelon Perla, MD  acetaminophen (TYLENOL) 500 MG tablet Take 500 mg by mouth daily as needed for moderate pain.     [provider]  atorvastatin (LIPITOR) 80 MG tablet Take 1 tablet (80 mg total) by mouth at bedtime. 06/01/16   Lelon Perla, MD  B Complex-C-Folic Acid (RENA-VITE PO) Take 1 tablet by mouth daily with breakfast.     [provider]  Calcium Carbonate Antacid (TUMS ULTRA 1000 PO) Take 3,000 mg by mouth 3 (three) times daily after meals.     [provider]  cholecalciferol (VITAMIN D) 1000 UNITS tablet Take 1,000 Units by mouth every Monday, Wednesday, and Friday.     [provider]  metoprolol tartrate (LOPRESSOR) 25 MG tablet Take 1 tablet (25 mg total) by mouth 2 (two) times daily. Patient taking differently: Take 12.5 mg by mouth 2 (two) times daily. TAKE 1/2 TABLET (12.5 MG) IN THE MORNING AND 1/2 TABLET (12.5 MG) AT NIGHT. 06/01/16   Lelon Perla, MD  NON FORMULARY Oxygen - 2 liters as needed    [provider]  ondansetron (ZOFRAN) 4 MG tablet  Take 1 tablet (4 mg total) by mouth every 8 (eight) hours as needed for nausea or vomiting. 06/23/16   [name], MD  pantoprazole (PROTONIX) 40 MG tablet TAKE 1 TABLET BY MOUTH TWICE A DAY Patient taking differently: take 80mg s in the morning 03/15/16   Mauri Pole, MD  Polyethyl Glycol-Propyl Glycol (SYSTANE) 0.4-0.3 % GEL ophthalmic gel Place 1 application into the right eye every 6 (six) hours as needed (eye dryness or pain).    [provider]  traMADol (ULTRAM) 50 MG tablet Take 0.5 tablets (25 mg total) by mouth 2 (two) times daily as needed for moderate pain. 07/27/16   [name], MD    Physical Exam:  Vitals:   09/16/16 1500 09/16/16 1506 09/16/16 1515 09/16/16 1516  BP: (!) 159/75  (!) 155/78   Pulse:  85 90 87  Resp:      Temp:      TempSrc:      SpO2:  93%  99%   Constitutional: NAD, calm, comfortable  HEad asymmetrical, Eyes: PERRL, lids and conjunctivae normal ENMT: Mucous membranes are moist, without exudate or lesions  Neck: normal, supple, no masses, no thyromegaly Respiratory: clear to auscultation bilaterally, no wheezing, no crackles. Normal respiratory effort  Cardiovascular: Regular rate and rhythm, no murmurs, rubs or gallops. No extremity edema. 2+ pedal pulses. No carotid bruits.  Abdomen: Soft, non tender, No hepatosplenomegaly. Bowel sounds positive.  Musculoskeletal: no clubbing / cyanosis. L hip immobilized, tender at the site of fracture  Skin: no jaundice, No lesions.  Neurologic: Sensation intact  Strength equal in all extremities. Chronic facial droop and decreased vision   Psychiatric:   Alert and oriented x 3. Normal mood.     Labs on Admission: I have personally reviewed following labs and imaging studies  CBC:  Recent Labs Lab 09/16/16 1419  WBC 6.5  HGB 11.2*  HCT 34.8*  MCV 89.2  PLT 231  Basic Metabolic Panel:  Recent Labs Lab 09/16/16 1419  NA 139  K 4.3  CL 96*  CO2 25  GLUCOSE 119*  BUN  58*  CREATININE 7.07*  CALCIUM 8.9    GFR: CrCl cannot be calculated (Unknown ideal weight.).  Liver Function Tests: No results for input(s): AST, ALT, ALKPHOS, BILITOT, PROT, ALBUMIN in the last 168 hours. No results for input(s): LIPASE, AMYLASE in the last 168 hours. No results for input(s): AMMONIA in the last 168 hours.  Coagulation Profile:  Recent Labs Lab 09/16/16 1419  INR 1.03    Cardiac Enzymes: No results for input(s): CKTOTAL, CKMB, CKMBINDEX, TROPONINI in the last 168 hours.  BNP (last 3 results)  Recent Labs  06/16/16 1113  PROBNP 2,034.0*    HbA1C: No results for input(s): HGBA1C in the last 72 hours.  CBG: No results for input(s): GLUCAP in the last 168 hours.  Lipid Profile: No results for input(s): CHOL, HDL, LDLCALC, TRIG, CHOLHDL, LDLDIRECT in the last 72 hours.  Thyroid Function Tests: No results for input(s): TSH, T4TOTAL, FREET4, T3FREE, THYROIDAB in the last 72 hours.  Anemia Panel: No results for input(s): VITAMINB12, FOLATE, FERRITIN, TIBC, IRON, RETICCTPCT in the last 72 hours.  Urine analysis: No results found for: COLORURINE, APPEARANCEUR, LABSPEC, PHURINE, GLUCOSEU, HGBUR, BILIRUBINUR, KETONESUR, PROTEINUR, UROBILINOGEN, NITRITE, LEUKOCYTESUR  Sepsis Labs: @LABRCNTIP (procalcitonin:4,lacticidven:4) )No results found for this or any previous visit (from the past 240 hour(s)).   Radiological Exams on Admission: Dg Chest Port 1 View  Result Date: 09/16/2016 CLINICAL DATA:  Preop for left hip fracture repair. EXAM: PORTABLE CHEST 1 VIEW COMPARISON:  Radiographs of June 16, 2016. FINDINGS: Stable cardiomegaly and central pulmonary vascular congestion. Atherosclerosis of thoracic aorta is noted. No pneumothorax or pleural effusion is noted. Stable elevated right hemidiaphragm is noted. No consolidative process is noted. Bony thorax is unremarkable. IMPRESSION: Stable cardiomegaly and central pulmonary vascular congestion. Aortic  atherosclerosis. No consolidative process is noted. Electronically Signed   By: Marijo Conception, M.D.   On: 09/16/2016 14:17   Dg Hip Unilat W Or W/o Pelvis 2-3 Views Left  Result Date: 09/16/2016 CLINICAL DATA:  83 year old female with 6 weeks of left hip pain, no known injury. EXAM: DG HIP (WITH OR WITHOUT PELVIS) 2-3V LEFT COMPARISON:  PET-CT 07/26/2005 FINDINGS: Aortoiliac calcified atherosclerosis. Bilateral calcified femoral artery atherosclerosis. Osteopenia. The pelvis appears intact. Grossly intact proximal right femur. The left femoral head is normally located. There is a left femoral neck fracture with mild varus angulation, no other displacement. The proximal left femur intertrochanteric segment appears to remain intact. Proximal left femoral shaft is intact. IMPRESSION: 1. Left femoral neck fracture with mild varus angulation appears acute versus subacute. 2. Osteopenia.  No other acute osseous abnormality identified. 3. Calcified aortic and iliofemoral atherosclerosis. Electronically Signed   By: Genevie Ann M.D.   On: 09/16/2016 12:50    EKG: Independently reviewed.  Assessment/Plan Active Problems:   Hyperlipidemia   Hypertension   Chronic Facial droop   ESRD on dialysis (HCC)   GERD (gastroesophageal reflux disease)   Mood disorder (HCC)   Systolic murmur   Imbalance   GAVE (gastric antral vascular ectasia)   Exertional dyspnea   CAD-severe multivessel, not CABG candidate   Forehead laceration   Chronic respiratory failure with hypoxia, on home O2 therapy (HCC)   Iron deficiency anemia due to chronic blood loss   S/p left hip fracture   Hyperglycemia    Left fem neck Fracture   Received  IV pain meds, immobilized.  the patient is for hemiarthroplasty of the left, by Dr. Erlinda Hong.  Admit to inpatient tele  NPO after midnight  Pain control Hold ASA PT/OT consult in am   ESRD on HD   [name], Cr  707  Baseline 5-6. Primary Nephrologist Dr. Blanchard Mane Nephrology involved for HD  today  Renal Diet. Other plans as per Nephrology Check CMET in am   CAD,  EKG and TN  pending  last cardiac catheterization 03/2015 on medical RX . patient is cardiac pain free at this time. Continue ASA,  after surgery  high dose statin, beta blockers   Hypertension BP  148/73   Pulse 84   Controlled Continue home anti-hypertensive medications after HD     Hyperlipidemia Continue home statins  GERD, no acute symptoms Continue PPI  Anemia of chronic disease Hemoglobin on admission  11.2 at baseline  Repeat CBC in am  No transfusion is indicated at this time Receives Aranesp as OP    Hyperglycemia, patient denies H/O DM, current glu 119  Check A1C, will need to monitor CBG   Chronic respiratory failure with hypoxia, wears 2 L home O2 prn, no diagnosis of COPD. NO Tobacco., NAD. CXR without infiltrate  O2 in the low 90s in RA, will place O2 continuous with puls ox monitor in 2 L [place]    DVT prophylaxis:  Heparin   Code Status:   Full      Family Communication:  Discussed with patient Disposition Plan: Expect patient to be discharged to home after condition improves Consults called:    Ortho and REnal per EDP  Admission status: Inpatient Tele   Rondel Jumbo, PA-C [facility]   09/16/2016, 3:28 PM

## 2016-09-16 NOTE — ED Provider Notes (Signed)
[facility] DEPT Provider Note   CSN: [id number] Arrival date & time: 09/16/16  1118     History   Chief Complaint Chief Complaint  Patient presents with  . Hip Pain    HPI [name] is a 83 y.o. female.  Patient c/o left hip pain for the past 1-2 months. States was diagnosed with bursitis. Yesterday, went to physical therapy, states walked quit a bit w walker then.  Today got up, was ambulatory at home, but then sat down to get ready for her normal hd appt - states when went to get back up, increased sharp pain left hip. Mod-severe, worse w wt bear and trying to walk. Denies radicular pain down leg. No back pain. Denies leg numbness, weakness, or swelling. No skin changes or lesions. No fever or chills.    The history is provided by the patient.  Hip Pain  Pertinent negatives include no chest pain, no abdominal pain and no shortness of breath.    Past Medical History:  Diagnosis Date  . Anemia in chronic kidney disease    IV iron infusion and anemia from GAVE blood loss.  . Arthritis     hands  . Bradycardia 2014   a. Noted 06/2012.  . Breast cancer ([place])    Left Breast 13 yrs. ago, RADIATION TX DONE  . CAD (coronary artery disease), native coronary artery    a. 03/2015 Cath: LAD 48m, D2 75, LCX 80, OM1 85, RI 99, RCA 70p/m, 95d, AM 85-->poor candidate for CABG & PCI-->Med Rx.  . Cardiac murmur    a. thought due to AVF (2D echo 06/2012 without significant valvular disease).   . Carotid arterial disease ([place])    a. 04/2015 Carotid U/S: 1-39% bilat ICA stenosis.  Marland Kitchen Ectropion of right lower eyelid 11/2012   s/p surgery (Dr. [name])  . End stage renal disease on dialysis [facility])    HD M,W,F [place] AVF now LUE AVF HD X 4 YRS   . Facial droop 1935   acquired during forceps delivery, some residual R visual loss  . Family history of adverse reaction to anesthesia    brother trouble waking up and confusion, now deceased  . GAVE (gastric antral vascular  ectasia) 08/2014   s/p EGD with APC  . GERD (gastroesophageal reflux disease)   . Hearing loss    Bilateral hearing aids  . History of blood transfusion 5 weeks ago  . History of home oxygen therapy    2 LITERS  OXYGEN PRN WHEN WALKING  . Hx of breast cancer 2004  . Hyperlipidemia   . Hypertension   . Hypotension    a. Associated w/ dialysis.  . Moderate mitral regurgitation    a. 05/2015 Echo: EF 55-60%, no rwma, triv AI, Ao sclerosis, mod MR, mildly to mod dil LA, PASP 29mmHg.  . Osteoporosis 11/2013   DEXA -3.4 radius IN BACK  . Sight impaired    Left: wears contact. Right: diminished peripheral vision    Patient Active Problem List   Diagnosis Date Noted  . Iron deficiency anemia due to chronic blood loss   . Chronic respiratory failure with hypoxia, on home O2 therapy ([place]) 06/23/2016  . Heme positive stool   . Chronic midline low back pain without sciatica 02/26/2016  . Osteoporosis 12/24/2015  . Forehead laceration 12/16/2015  . Fall 12/16/2015  . Advanced care planning/counseling discussion 11/06/2015  . Moderate to severe pulmonary hypertension ([place]) 06/13/2015  . Abnormal chest  x-ray 06/13/2015  . CAD-severe multivessel, not CABG candidate 05/06/2015  . Abnormal nuclear stress test   . Abnormal cardiovascular function study 03/06/2015  . Demand ischemia ([place])   . Acute on chronic diastolic congestive heart failure ([place]) 09/23/2014  . Exertional dyspnea   . NSTEMI (non-ST elevated myocardial infarction) ([place])   . GAVE (gastric antral vascular ectasia) 04/09/2014  . Anemia due to blood loss, chronic 04/08/2014  . Upper GI bleed   . Right shoulder injury 03/12/2014  . Left foot pain 01/10/2014  . Nasal congestion 11/13/2013  . Osteoarthritis, hand 11/13/2013  . Imbalance 11/16/2012  . Abnormal electrocardiogram 07/25/2012  . Mood disorder ([place]) 06/01/2012  . Systolic murmur 50/35/4656  . History of diabetes mellitus, type II   . Hyperlipidemia   .  Hypertension   . Chronic Facial droop   . ESRD on dialysis ([place])   . GERD (gastroesophageal reflux disease)     Past Surgical History:  Procedure Laterality Date  . AV FISTULA PLACEMENT  07/20/10   Right brachiocephalic AVF  . BASCILIC VEIN TRANSPOSITION Left 07/24/2013   Procedure: BASILIC VEIN TRANSPOSITION;  Surgeon: [name], MD;  Location: [facility];  Service: Vascular;  Laterality: Left;  . BREAST BIOPSY Left   . BREAST LUMPECTOMY Left   . CARDIAC CATHETERIZATION N/A 04/10/2015   Procedure: Left Heart Cath and Coronary Angiography;  Surgeon: [name], MD;  Location: [facility];  Service: Cardiovascular;  Laterality: N/A;  . CATARACT EXTRACTION Bilateral 1980  . DEXA  11/2015   DEXA T -4.3 [facility]  . ESOPHAGOGASTRODUODENOSCOPY N/A 04/09/2014   Procedure: ESOPHAGOGASTRODUODENOSCOPY (EGD);  Surgeon: [name], MD;  Location: [facility];  Service: Endoscopy;  Laterality: N/A;  . ESOPHAGOGASTRODUODENOSCOPY N/A 09/23/2014   Procedure: ESOPHAGOGASTRODUODENOSCOPY (EGD);  Surgeon: [name], MD;  Location: [facility];  Service: Endoscopy;  Laterality: N/A;  . ESOPHAGOGASTRODUODENOSCOPY N/A 12/26/2014   neg H pylori, benign biopsies; ESOPHAGOGASTRODUODENOSCOPY (EGD);  Surgeon: [name], MD  . ESOPHAGOGASTRODUODENOSCOPY N/A 06/15/2015   Procedure: ESOPHAGOGASTRODUODENOSCOPY (EGD);  Surgeon: Milus Banister, MD;  Location: [facility];  Service: Endoscopy;  Laterality: N/A;  . ESOPHAGOGASTRODUODENOSCOPY N/A 06/27/2015   Procedure: ESOPHAGOGASTRODUODENOSCOPY (EGD);  Surgeon: Mauri Pole, MD;  Location[facility];  Service: Endoscopy;  Laterality: N/A;  . ESOPHAGOGASTRODUODENOSCOPY (EGD) WITH PROPOFOL N/A 11/12/2014   Procedure: ESOPHAGOGASTRODUODENOSCOPY (EGD) WITH PROPOFOL;  Surgeon: [name], MD;  Location: [facility];  Service: Endoscopy;  Laterality: N/A;  . ESOPHAGOGASTRODUODENOSCOPY (EGD) WITH PROPOFOL N/A 07/31/2015   Procedure:  ESOPHAGOGASTRODUODENOSCOPY (EGD) WITH PROPOFOL;  Surgeon: Mauri Pole, MD;  Location: [facility];  Service: Endoscopy;  Laterality: N/A;  . ESOPHAGOGASTRODUODENOSCOPY (EGD) WITH PROPOFOL N/A 03/08/2016   Procedure: ESOPHAGOGASTRODUODENOSCOPY (EGD) WITH PROPOFOL;  Surgeon: Mauri Pole, MD;  Location: [facility];  Service: Endoscopy;  Laterality: N/A;  . ESOPHAGOGASTRODUODENOSCOPY (EGD) WITH PROPOFOL N/A 07/01/2016   Procedure: ESOPHAGOGASTRODUODENOSCOPY (EGD) WITH PROPOFOL;  Surgeon: Mauri Pole, MD;  Location: [facility];  Service: Endoscopy;  Laterality: N/A;  . EYE SURGERY  1966   regular cataract  . HERNIA REPAIR    . HOT HEMOSTASIS N/A 11/12/2014   Procedure: HOT HEMOSTASIS (ARGON PLASMA COAGULATION/BICAP);  Surgeon: [name], MD;  [facility];  Service: Endoscopy;  Laterality: N/A;  . HOT HEMOSTASIS N/A 06/27/2015   Procedure: HOT HEMOSTASIS (ARGON PLASMA COAGULATION/BICAP);  Surgeon: Mauri Pole, MD;  Location[facility];  Service: Endoscopy;  Laterality: N/A;  . HOT HEMOSTASIS  N/A 07/31/2015   Procedure: HOT HEMOSTASIS (ARGON PLASMA COAGULATION/BICAP);  Surgeon: Mauri Pole, MD;  Location[facility];  Service: Endoscopy;  Laterality: N/A;  . HOT HEMOSTASIS N/A 07/01/2016   Procedure: HOT HEMOSTASIS (ARGON PLASMA COAGULATION/BICAP);  Surgeon: Mauri Pole, MD;  Location[facility];  Service: Endoscopy;  Laterality: N/A;  . PERIPHERAL VASCULAR CATHETERIZATION N/A 08/15/2014   Procedure: A/V Shuntogram/Fistulagram;  Surgeon: [name], MD;  Location: [facility];  Service: Cardiovascular;  Laterality: N/A;  . PERIPHERAL VASCULAR CATHETERIZATION N/A 04/28/2015   Procedure: A/V Shuntogram/Fistulagram;  Surgeon: [name], MD;  Location: [facility];  Service: Cardiovascular;  Laterality: N/A;  . PERIPHERAL VASCULAR CATHETERIZATION Left 04/28/2015   Procedure: A/V Shunt Intervention;  Surgeon: [name], MD;   Location: [facility];  Service: Cardiovascular;  Laterality: Left;  . PERIPHERAL VASCULAR CATHETERIZATION Left 08/07/2015   Procedure: A/V Shuntogram/Fistulagram;  Surgeon: [name], MD;  Location: [facility];  Service: Cardiovascular;  Laterality: Left;  . PERIPHERAL VASCULAR CATHETERIZATION N/A 08/07/2015   Procedure: A/V Shunt Intervention;  Surgeon: [name], MD;  Location: [facility];  Service: Cardiovascular;  Laterality: N/A;  . PERIPHERAL VASCULAR CATHETERIZATION Left 11/03/2015   Procedure: A/V Shuntogram/Fistulagram;  Surgeon: [name], MD;  Location: [facility];  Service: Cardiovascular;  Laterality: Left;  . PERIPHERAL VASCULAR CATHETERIZATION N/A 11/03/2015   Procedure: A/V Shunt Intervention;  Surgeon: [name], MD;  Location: [facility];  Service: Cardiovascular;  Laterality: N/A;  . PERIPHERAL VASCULAR CATHETERIZATION N/A 03/19/2016   Procedure: A/V Fistulagram;  Surgeon: Katha Cabal, MD;  Location: [facility];  Service: Cardiovascular;  Laterality: N/A;  . PERIPHERAL VASCULAR CATHETERIZATION N/A 03/30/2016   Procedure: A/V Fistulagram;  Surgeon: Katha Cabal, MD;  Location: [facility];  Service: Cardiovascular;  Laterality: N/A;  . PERIPHERAL VASCULAR CATHETERIZATION N/A 03/30/2016   Procedure: A/V Shunt Intervention;  Surgeon: Katha Cabal, MD;  Location: [facility];  Service: Cardiovascular;  Laterality: N/A;  . SHUNTOGRAM N/A 04/16/2011   Procedure: [place];  Surgeon: [name], MD;  Location[facility];  Service: Cardiovascular;  Laterality: N/A;  . SHUNTOGRAM Right 07/05/2013   Procedure: FISTULOGRAM;  Surgeon: [name], MD;  Location: [facility] CATH LAB;  Service: Cardiovascular;  Laterality: Right;  . UMBILICAL HERNIA REPAIR  2011  . US ECHOCARDIOGRAPHY  06/2012   LVH, EF 65%, grade 1 diastol dysfunction, mod dliated LAD    OB History    No data available        Home Medications    Prior to Admission medications   Medication Sig Start Date End Date Taking? Authorizing Provider  acetaminophen (TYLENOL) 500 MG tablet Take 500 mg by mouth daily as needed for moderate pain.     [provider]  amLODipine (NORVASC) 5 MG tablet Take 1 tablet (5 mg total) by mouth at bedtime. 06/01/16   Lelon Perla, MD  aspirin EC 81 MG tablet Take 1 tablet (81 mg total) by mouth daily. 03/06/15   Lelon Perla, MD  atorvastatin (LIPITOR) 80 MG tablet Take 1 tablet (80 mg total) by mouth at bedtime. 06/01/16   Lelon Perla, MD  B Complex-C-Folic Acid (RENA-VITE PO) Take 1 tablet by mouth daily with breakfast.     [provider]  Calcium Carbonate Antacid (TUMS ULTRA 1000 PO) Take 3,000 mg  by mouth 3 (three) times daily after meals.     [provider]  cholecalciferol (VITAMIN D) 1000 UNITS tablet Take 1,000 Units by mouth every Monday, Wednesday, and Friday.     [provider]  metoprolol tartrate (LOPRESSOR) 25 MG tablet Take 1 tablet (25 mg total) by mouth 2 (two) times daily. Patient taking differently: Take 12.5 mg by mouth 2 (two) times daily. TAKE 1/2 TABLET (12.5 MG) IN THE MORNING AND 1/2 TABLET (12.5 MG) AT NIGHT. 06/01/16   Lelon Perla, MD  NON FORMULARY Oxygen - 2 liters as needed    [provider]  ondansetron (ZOFRAN) 4 MG tablet Take 1 tablet (4 mg total) by mouth every 8 (eight) hours as needed for nausea or vomiting. 06/23/16   [name], MD  pantoprazole (PROTONIX) 40 MG tablet TAKE 1 TABLET BY MOUTH TWICE A DAY Patient taking differently: take 80mg s in the morning 03/15/16   Mauri Pole, MD  Polyethyl Glycol-Propyl Glycol (SYSTANE) 0.4-0.3 % GEL ophthalmic gel Place 1 application into the right eye every 6 (six) hours as needed (eye dryness or pain).    [provider]  traMADol (ULTRAM) 50 MG tablet Take 0.5 tablets (25 mg total) by mouth 2 (two) times daily as  needed for moderate pain. 07/27/16   [name], MD    Family History Family History  Problem Relation Age of Onset  . Cancer Father        stomach  . CAD Father        MI at age 62  . Heart disease Father   . Heart attack Father   . Hypertension Mother   . Heart disease Brother        Congenital  . Hypertension Brother   . Diabetes Brother   . Cancer Sister        female (uterus?)  . Colon cancer Neg Hx   . Esophageal cancer Neg Hx   . Stomach cancer Neg Hx     Social History Social History  Substance Use Topics  . Smoking status: Former Smoker    Years: 1.00    Types: Cigarettes    Quit date: 03/29/1953  . Smokeless tobacco: Never Used  . Alcohol use 0.0 oz/week     Comment: occasional     Allergies   Sulfa antibiotics   Review of Systems Review of Systems  Constitutional: Negative for fever.  HENT: Negative for sore throat.   Eyes: Negative for redness.  Respiratory: Negative for shortness of breath.   Cardiovascular: Negative for chest pain.  Gastrointestinal: Negative for abdominal pain.  Genitourinary: Negative for flank pain.  Musculoskeletal: Negative for back pain.  Skin: Negative for rash.  Neurological: Negative for weakness and numbness.  Hematological: Does not bruise/bleed easily.  Psychiatric/Behavioral: Negative for confusion.     Physical Exam Updated Vital Signs BP (!) 170/74   Pulse 82   Temp 97.7 F (36.5 C) (Oral)   Resp 18   SpO2 95%   Physical Exam  Constitutional: She appears well-developed and well-nourished. No distress.  HENT:  Head: Atraumatic.  Eyes: Conjunctivae are normal. No scleral icterus.  Neck: Neck supple. No tracheal deviation present.  Cardiovascular: Normal rate, regular rhythm, normal heart sounds and intact distal pulses.   Pulmonary/Chest: Effort normal and breath sounds normal. No respiratory distress.  Abdominal: Soft. Normal appearance. She exhibits no distension. There is no tenderness.   Genitourinary:  Genitourinary Comments: No cva tenderness.   Musculoskeletal: She  exhibits no edema.  LS spine non tender, aligned. Tenderness left hip laterally. Good passive rom left hip and knee without pain. Distal pulses palp. No swelling to leg.   Neurological: She is alert.  LLE motor intact, stre 5/5. sens grossly intact.   Skin: Skin is warm and dry. No rash noted. She is not diaphoretic.  Psychiatric: She has a normal mood and affect.  Nursing note and vitals reviewed.    ED Treatments / Results  Labs (all labs ordered are listed, but only abnormal results are displayed)   Results for orders placed or performed in visit on 08/16/16  HM MAMMOGRAPHY  Result Value Ref Range   HM Mammogram 0-4 Bi-Rad 0-4 Bi-Rad, Self Reported Normal   Dg Hip Unilat W Or W/o Pelvis 2-3 Views Left  Result Date: 09/16/2016 CLINICAL DATA:  83 year old female with 6 weeks of left hip pain, no known injury. EXAM: DG HIP (WITH OR WITHOUT PELVIS) 2-3V LEFT COMPARISON:  PET-CT 07/26/2005 FINDINGS: Aortoiliac calcified atherosclerosis. Bilateral calcified femoral artery atherosclerosis. Osteopenia. The pelvis appears intact. Grossly intact proximal right femur. The left femoral head is normally located. There is a left femoral neck fracture with mild varus angulation, no other displacement. The proximal left femur intertrochanteric segment appears to remain intact. Proximal left femoral shaft is intact. IMPRESSION: 1. Left femoral neck fracture with mild varus angulation appears acute versus subacute. 2. Osteopenia.  No other acute osseous abnormality identified. 3. Calcified aortic and iliofemoral atherosclerosis. Electronically Signed   By: Genevie Ann M.D.   On: 09/16/2016 12:50    EKG  EKG Interpretation None       Radiology Dg Hip Unilat W Or W/o Pelvis 2-3 Views Left  Result Date: 09/16/2016 CLINICAL DATA:  83 year old female with 6 weeks of left hip pain, no known injury. EXAM: DG HIP (WITH OR  WITHOUT PELVIS) 2-3V LEFT COMPARISON:  PET-CT 07/26/2005 FINDINGS: Aortoiliac calcified atherosclerosis. Bilateral calcified femoral artery atherosclerosis. Osteopenia. The pelvis appears intact. Grossly intact proximal right femur. The left femoral head is normally located. There is a left femoral neck fracture with mild varus angulation, no other displacement. The proximal left femur intertrochanteric segment appears to remain intact. Proximal left femoral shaft is intact. IMPRESSION: 1. Left femoral neck fracture with mild varus angulation appears acute versus subacute. 2. Osteopenia.  No other acute osseous abnormality identified. 3. Calcified aortic and iliofemoral atherosclerosis. Electronically Signed   By: Genevie Ann M.D.   On: 09/16/2016 12:50    Procedures Procedures (including critical care time)  Medications Ordered in ED Medications  predniSONE (DELTASONE) tablet 40 mg (not administered)  traMADol (ULTRAM) tablet 50 mg (not administered)     Initial Impression / Assessment and Plan / ED Course  I have reviewed the triage vital signs and the nursing notes.  Pertinent labs & imaging results that were available during my care of the patient were reviewed by me and considered in my medical decision making (see chart for details).  Prednisone po. Ultram po.   Xrays.  Reviewed nursing notes and prior charts for additional history.   Xray with femoral neck fx.  Morphine iv.   Orthopedics consult.  Pt is a dialysis pt, due today - renal notified of admission w hip fx, and needing hd - they will arrange hd.  Hospitalists consulted for admission.  Recheck pain improved.     Final Clinical Impressions(s) / ED Diagnoses   Final diagnoses:  None    New Prescriptions New Prescriptions  No medications on file     Lajean Saver, MD 09/16/16 1341

## 2016-09-16 NOTE — Progress Notes (Signed)
Patient arrived to unit per bed.  Reviewed treatment plan and this RN agrees.  Report received from bedside RN, Marolyn Hammock.  Consent obtained.  Patient A & O X 4. Lung sounds diminished and clear to ausculation in all fields. No edema. Cardiac: NSR.  Prepped LUAVF with alcohol and cannulated with two 15 gauge needles.  Pulsation of blood noted.  Flushed access well with saline per protocol.  Connected and secured lines and initiated tx at 2033.  UF goal of 2500 mL and net fluid removal of 2000 mL.  Will continue to monitor.

## 2016-09-16 NOTE — ED Triage Notes (Addendum)
Pt arrives via [facility] from [facility]/o left hip pain that increased today. Pt has hx of bursitis, did rehab yesterday but is unable to walk today due to pain. Pt a/ox4, facial droop baseline for patient. Supposed to have dialysis at [facility] today but was unable to get herself there.

## 2016-09-16 NOTE — Consult Note (Signed)
Reason for Consult:Left hip fx Referring Physician: [name] is an 83 y.o. female.  HPI: [name] has been suffering from left hip pain for about a month now. She has been able to ambulate with a walker through it however. He had seen a medical provider a couple of weeks ago and had x-rays which were reportedly normal. This morning she was going about her usual morning routine and was standing at the sink combing her hair when her pain increased dramatically and she was unable to lift her leg or ambulate. She did not fall. She was brought in by EMS and x-rays showed a left femoral neck fx and orthopedic surgery was consulted.  Past Medical History:  Diagnosis Date  . Anemia in chronic kidney disease    IV iron infusion and anemia from GAVE blood loss.  . Arthritis     hands  . Bradycardia 2014   a. Noted 06/2012.  . Breast cancer ([place])    Left Breast 13 yrs. ago, RADIATION TX DONE  . CAD (coronary artery disease), native coronary artery    a. 03/2015 Cath: LAD 25m D2 75, LCX 80, OM1 85, RI 99, RCA 70p/m, 95d, AM 85-->poor candidate for CABG & PCI-->Med Rx.  . Cardiac murmur    a. thought due to AVF (2D echo 06/2012 without significant valvular disease).   . Carotid arterial disease ([place]    a. 04/2015 Carotid U/S: 1-39% bilat ICA stenosis.  .Marland KitchenEctropion of right lower eyelid 11/2012   s/p surgery (Dr. FVickki Muff  . End stage renal disease on dialysis ([facility]    [place] now [place] X 4 YRS   . Facial droop 1935   acquired during forceps delivery, some residual R visual loss  . Family history of adverse reaction to anesthesia    brother trouble waking up and confusion, now deceased  . GAVE (gastric antral vascular ectasia) 08/2014   s/p EGD with APC  . GERD (gastroesophageal reflux disease)   . Hearing loss    Bilateral hearing aids  . History of blood transfusion 5 weeks ago  . History of home oxygen therapy    2 LITERS  OXYGEN PRN WHEN  WALKING  . Hx of breast cancer 2004  . Hyperlipidemia   . Hypertension   . Hypotension    a. Associated w/ dialysis.  . Moderate mitral regurgitation    a. 05/2015 Echo: EF 55-60%, no rwma, triv AI, Ao sclerosis, mod MR, mildly to mod dil LA, PASP 477mg.  . Osteoporosis 11/2013   DEXA -3.4 radius IN BACK  . Sight impaired    Left: wears contact. Right: diminished peripheral vision    Past Surgical History:  Procedure Laterality Date  . AV FISTULA PLACEMENT  07/20/10   Right brachiocephalic AVF  . BASCILIC VEIN TRANSPOSITION Left 07/24/2013   Procedure: BASILIC VEIN TRANSPOSITION;  Surgeon: ChElam DutchMD;  [place] Service: Vascular;  Laterality: Left;  . BREAST BIOPSY Left   . BREAST LUMPECTOMY Left   . CARDIAC CATHETERIZATION N/A 04/10/2015   Procedure: Left Heart Cath and Coronary Angiography;  Surgeon: HeBelva CromeMD;  [place] [facility];  Service: Cardiovascular;  Laterality: N/A;  . CATARACT EXTRACTION Bilateral 1980  . DEXA  11/2015   DEXA T -4.3 [facility]  . ESOPHAGOGASTRODUODENOSCOPY N/A 04/09/2014   Procedure: ESOPHAGOGASTRODUODENOSCOPY (EGD);  Surgeon: RoInda CastleMD;  [facility] Service:  Endoscopy;  Laterality: N/A;  . ESOPHAGOGASTRODUODENOSCOPY N/A 09/23/2014   Procedure: ESOPHAGOGASTRODUODENOSCOPY (EGD);  Surgeon: [name], MD;  Location: [place];  Service: Endoscopy;  Laterality: N/A;  . ESOPHAGOGASTRODUODENOSCOPY N/A 12/26/2014   neg H pylori, benign biopsies; ESOPHAGOGASTRODUODENOSCOPY (EGD);  Surgeon: [name], MD  . ESOPHAGOGASTRODUODENOSCOPY N/A 06/15/2015   Procedure: ESOPHAGOGASTRODUODENOSCOPY (EGD);  Surgeon: Milus Banister, MD;  Location: [place];  Service: Endoscopy;  Laterality: N/A;  . ESOPHAGOGASTRODUODENOSCOPY N/A 06/27/2015   Procedure: ESOPHAGOGASTRODUODENOSCOPY (EGD);  Surgeon: Mauri Pole, MD;  [facility];  Service: Endoscopy;  Laterality: N/A;  .  ESOPHAGOGASTRODUODENOSCOPY (EGD) WITH PROPOFOL N/A 11/12/2014   Procedure: ESOPHAGOGASTRODUODENOSCOPY (EGD) WITH PROPOFOL;  Surgeon: [name], MD;  [facility]: [facility];  Service: Endoscopy;  Laterality: N/A;  . ESOPHAGOGASTRODUODENOSCOPY (EGD) WITH PROPOFOL N/A 07/31/2015   Procedure: ESOPHAGOGASTRODUODENOSCOPY (EGD) WITH PROPOFOL;  Surgeon: Mauri Pole, MD;  Location: [facility];  Service: Endoscopy;  Laterality: N/A;  . ESOPHAGOGASTRODUODENOSCOPY (EGD) WITH PROPOFOL N/A 03/08/2016   Procedure: ESOPHAGOGASTRODUODENOSCOPY (EGD) WITH PROPOFOL;  Surgeon: Mauri Pole, MD;  Location: [facility];  Service: Endoscopy;  Laterality: N/A;  . ESOPHAGOGASTRODUODENOSCOPY (EGD) WITH PROPOFOL N/A 07/01/2016   Procedure: ESOPHAGOGASTRODUODENOSCOPY (EGD) WITH PROPOFOL;  Surgeon: Mauri Pole, MD;  Location: [facility];  Service: Endoscopy;  Laterality: N/A;  . EYE SURGERY  1966   regular cataract  . HERNIA REPAIR    . HOT HEMOSTASIS N/A 11/12/2014   Procedure: HOT HEMOSTASIS (ARGON PLASMA COAGULATION/BICAP);  Surgeon: [name], MD;  [facility];  Service: Endoscopy;  Laterality: N/A;  . HOT HEMOSTASIS N/A 06/27/2015   Procedure: HOT HEMOSTASIS (ARGON PLASMA COAGULATION/BICAP);  Surgeon: Mauri Pole, MD;  [facility];  Service: Endoscopy;  Laterality: N/A;  . HOT HEMOSTASIS N/A 07/31/2015   Procedure: HOT HEMOSTASIS (ARGON PLASMA COAGULATION/BICAP);  Surgeon: Mauri Pole, MD;  [facility]: [facility];  Service: Endoscopy;  Laterality: N/A;  . HOT HEMOSTASIS N/A 07/01/2016   Procedure: HOT HEMOSTASIS (ARGON PLASMA COAGULATION/BICAP);  Surgeon: Mauri Pole, MD;  [facility];  Service: Endoscopy;  Laterality: N/A;  . PERIPHERAL VASCULAR CATHETERIZATION N/A 08/15/2014   Procedure: A/V Shuntogram/Fistulagram;  Surgeon: [name], MD;  Location: [place] CV LAB;  Service: Cardiovascular;  Laterality: N/A;  . PERIPHERAL VASCULAR  CATHETERIZATION N/A 04/28/2015   Procedure: A/V Shuntogram/Fistulagram;  Surgeon: [name], MD;  Location: [place] CV LAB;  Service: Cardiovascular;  Laterality: N/A;  . PERIPHERAL VASCULAR CATHETERIZATION Left 04/28/2015   Procedure: A/V Shunt Intervention;  Surgeon: [name], MD;  Location: [place] CV LAB;  Service: Cardiovascular;  Laterality: Left;  . PERIPHERAL VASCULAR CATHETERIZATION Left 08/07/2015   Procedure: A/V Shuntogram/Fistulagram;  Surgeon: [name], MD;  Location: [place] CV LAB;  Service: Cardiovascular;  Laterality: Left;  . PERIPHERAL VASCULAR CATHETERIZATION N/A 08/07/2015   Procedure: A/V Shunt Intervention;  Surgeon: [name], MD;  Location: [place] CV LAB;  Service: Cardiovascular;  Laterality: N/A;  . PERIPHERAL VASCULAR CATHETERIZATION Left 11/03/2015   Procedure: A/V Shuntogram/Fistulagram;  Surgeon: [name], MD;  Location: [place] CV LAB;  Service: Cardiovascular;  Laterality: Left;  . PERIPHERAL VASCULAR CATHETERIZATION N/A 11/03/2015   Procedure: A/V Shunt Intervention;  Surgeon: [name], MD;  Location: [place] CV LAB;  Service: Cardiovascular;  Laterality: N/A;  . PERIPHERAL VASCULAR CATHETERIZATION N/A 03/19/2016   Procedure: A/V Fistulagram;  Surgeon: Katha Cabal, MD;  Location: [place] INVASIVE CV  LAB;  Service: Cardiovascular;  Laterality: N/A;  . PERIPHERAL VASCULAR CATHETERIZATION N/A 03/30/2016   Procedure: A/V Fistulagram;  Surgeon: Katha Cabal, MD;  Location: [place] CV LAB;  Service: Cardiovascular;  Laterality: N/A;  . PERIPHERAL VASCULAR CATHETERIZATION N/A 03/30/2016   Procedure: A/V Shunt Intervention;  Surgeon: Katha Cabal, MD;  Location: [place] CV LAB;  Service: Cardiovascular;  Laterality: N/A;  . SHUNTOGRAM N/A 04/16/2011   Procedure: [place] Mal[place];  Surgeon: [name], MD;  [facility];  Service: Cardiovascular;  Laterality: N/A;  . SHUNTOGRAM Right 07/05/2013    Procedure: FISTULOGRAM;  Surgeon: [name], MD;  Location: [facility] CATH LAB;  Service: Cardiovascular;  Laterality: Right;  . UMBILICAL HERNIA REPAIR  2011  . US ECHOCARDIOGRAPHY  06/2012   LVH, EF 65%, grade 1 diastol dysfunction, mod dliated LAD    Family History  Problem Relation Age of Onset  . Cancer Father        stomach  . CAD Father        MI at age 63  . Heart disease Father   . Heart attack Father   . Hypertension Mother   . Heart disease Brother        Congenital  . Hypertension Brother   . Diabetes Brother   . Cancer Sister        female (uterus?)  . Colon cancer Neg Hx   . Esophageal cancer Neg Hx   . Stomach cancer Neg Hx     Social History:  reports that she quit smoking about 63 years ago. Her smoking use included Cigarettes. She quit after 1.00 year of use. She has never used smokeless tobacco. She reports that she drinks alcohol. She reports that she does not use drugs.  Allergies:  Allergies  Allergen Reactions  . Sulfa Antibiotics Rash and Other (See Comments)    fever    Medications: I have reviewed the patient's current medications.  Results for orders placed or performed during the hospital encounter of 09/16/16 (from the past 48 hour(s))  CBC     Status: Abnormal   Collection Time: 09/16/16  2:19 PM  Result Value Ref Range   WBC 6.5 4.0 - 10.5 K/uL   RBC 3.90 3.87 - 5.11 MIL/uL   Hemoglobin 11.2 (L) 12.0 - 15.0 g/dL   HCT 34.8 (L) 36.0 - 46.0 %   MCV 89.2 78.0 - 100.0 fL   MCH 28.7 26.0 - 34.0 pg   MCHC 32.2 30.0 - 36.0 g/dL   RDW 18.9 (H) 11.5 - 15.5 %   Platelets 231 150 - 400 K/uL  Protime-INR     Status: None   Collection Time: 09/16/16  2:19 PM  Result Value Ref Range   Prothrombin Time 13.5 11.4 - 15.2 seconds   INR 4.17   Basic metabolic panel     Status: Abnormal   Collection Time: 09/16/16  2:19 PM  Result Value Ref Range   Sodium 139 135 - 145 mmol/L   Potassium 4.3 3.5 - 5.1 mmol/L   Chloride 96 (L) 101 - 111 mmol/L    CO2 25 22 - 32 mmol/L   Glucose, Bld 119 (H) 65 - 99 mg/dL   BUN 58 (H) 6 - 20 mg/dL   Creatinine, Ser 7.07 (H) 0.44 - 1.00 mg/dL   Calcium 8.9 8.9 - 10.3 mg/dL   GFR calc non Af Amer 5 (L) >60 mL/min   GFR calc Af Amer 6 (L) >60 mL/min  Comment: (NOTE) The eGFR has been calculated using the CKD EPI equation. This calculation has not been validated in all clinical situations. eGFR's persistently <60 mL/min signify possible Chronic Kidney Disease.    Anion gap 18 (H) 5 - 15    Dg Chest Port 1 View  Result Date: 09/16/2016 CLINICAL DATA:  Preop for left hip fracture repair. EXAM: PORTABLE CHEST 1 VIEW COMPARISON:  Radiographs of June 16, 2016. FINDINGS: Stable cardiomegaly and central pulmonary vascular congestion. Atherosclerosis of thoracic aorta is noted. No pneumothorax or pleural effusion is noted. Stable elevated right hemidiaphragm is noted. No consolidative process is noted. Bony thorax is unremarkable. IMPRESSION: Stable cardiomegaly and central pulmonary vascular congestion. Aortic atherosclerosis. No consolidative process is noted. Electronically Signed   By: Marijo Conception, M.D.   On: 09/16/2016 14:17   Dg Hip Unilat W Or W/o Pelvis 2-3 Views Left  Result Date: 09/16/2016 CLINICAL DATA:  83 year old female with 6 weeks of left hip pain, no known injury. EXAM: DG HIP (WITH OR WITHOUT PELVIS) 2-3V LEFT COMPARISON:  PET-CT 07/26/2005 FINDINGS: Aortoiliac calcified atherosclerosis. Bilateral calcified femoral artery atherosclerosis. Osteopenia. The pelvis appears intact. Grossly intact proximal right femur. The left femoral head is normally located. There is a left femoral neck fracture with mild varus angulation, no other displacement. The proximal left femur intertrochanteric segment appears to remain intact. Proximal left femoral shaft is intact. IMPRESSION: 1. Left femoral neck fracture with mild varus angulation appears acute versus subacute. 2. Osteopenia.  No other acute  osseous abnormality identified. 3. Calcified aortic and iliofemoral atherosclerosis. Electronically Signed   By: Genevie Ann M.D.   On: 09/16/2016 12:50    Review of Systems  Constitutional: Negative for weight loss.  HENT: Negative for ear discharge, ear pain, hearing loss and tinnitus.   Eyes: Negative for blurred vision, double vision, photophobia and pain.  Respiratory: Negative for cough, sputum production and shortness of breath.   Cardiovascular: Negative for chest pain.  Gastrointestinal: Negative for abdominal pain, nausea and vomiting.  Genitourinary: Negative for dysuria, flank pain, frequency and urgency.  Musculoskeletal: Positive for joint pain (Left hip). Negative for back pain, falls, myalgias and neck pain.  Neurological: Negative for dizziness, tingling, sensory change, focal weakness, loss of consciousness and headaches.  Endo/Heme/Allergies: Does not bruise/bleed easily.  Psychiatric/Behavioral: Negative for depression, memory loss and substance abuse. The patient is not nervous/anxious.    Blood pressure (!) 159/75, pulse 85, temperature 97.7 F (36.5 C), temperature source Oral, resp. rate 16, SpO2 93 %. Physical Exam  Constitutional: She appears well-developed and well-nourished. No distress.  HENT:  Head: Normocephalic.  Eyes: Conjunctivae are normal. Right eye exhibits no discharge. Left eye exhibits no discharge. No scleral icterus.  Cardiovascular: Normal rate and regular rhythm.   Respiratory: Effort normal. No respiratory distress.  Musculoskeletal:  Bilateral shoulder, elbow, wrist, digits- no skin wounds, nontender, no instability, no blocks to motion  Sens  Ax/R/M/U intact  Mot   Ax/ R/ PIN/ M/ AIN/ U intact  Rad 2+  RLE No traumatic wounds, ecchymosis, or rash  Nontender  No effusions  Knee stable to varus/ valgus and anterior/posterior stress  Sens DPN, SPN, TN intact  Motor EHL, ext, flex, evers 5/5  DP 2+, PT 2+, No significant edema   LLE No  traumatic wounds, ecchymosis, or rash  TTP hip  No effusions  Knee stable to varus/ valgus and anterior/posterior stress  Sens DPN, SPN, TN intact  Motor EHL, ext, flex, evers 5/5  DP 2+, PT 2+, No significant edema  Neurological: She is alert.  Skin: Skin is warm and dry. She is not diaphoretic.  Psychiatric: She has a normal mood and affect. Her behavior is normal.    Assessment/Plan: Left femoral neck fx -- For hemiarthroplasty tomorrow by Dr. Erlinda Hong. Will need surgical clearance prior to that. NPO after MN. Bedrest for now. Multiple medical problems -- per IM    Lisette Abu, PA-C Orthopedic Surgery [phone] 09/16/2016, 3:13 PM

## 2016-09-17 ENCOUNTER — Encounter (HOSPITAL_COMMUNITY)
Admission: EM | Disposition: A | Discharge status: Skilled Nursing Facility | Payer: Self-pay | Source: Home / Self Care | Attending: Internal Medicine

## 2016-09-17 ENCOUNTER — Encounter (HOSPITAL_COMMUNITY): Payer: Self-pay | Admitting: Anesthesiology

## 2016-09-17 DIAGNOSIS — I509 Heart failure, unspecified: Secondary | ICD-10-CM

## 2016-09-17 DIAGNOSIS — S72002A Fracture of unspecified part of neck of left femur, initial encounter for closed fracture: Secondary | ICD-10-CM

## 2016-09-17 DIAGNOSIS — I5021 Acute systolic (congestive) heart failure: Secondary | ICD-10-CM

## 2016-09-17 DIAGNOSIS — Z9981 Dependence on supplemental oxygen: Secondary | ICD-10-CM

## 2016-09-17 DIAGNOSIS — Z01810 Encounter for preprocedural cardiovascular examination: Secondary | ICD-10-CM

## 2016-09-17 DIAGNOSIS — J9611 Chronic respiratory failure with hypoxia: Secondary | ICD-10-CM

## 2016-09-17 LAB — URINALYSIS, COMPLETE (UACMP) WITH MICROSCOPIC
BILIRUBIN URINE: NEGATIVE
GLUCOSE, UA: NEGATIVE mg/dL
Ketones, ur: NEGATIVE mg/dL
NITRITE: POSITIVE — AB
PH: 8.5 — AB (ref 5.0–8.0)
Protein, ur: 100 mg/dL — AB
SPECIFIC GRAVITY, URINE: 1.015 (ref 1.005–1.030)
SQUAMOUS EPITHELIAL / LPF: NONE SEEN

## 2016-09-17 LAB — RENAL FUNCTION PANEL
ALBUMIN: 2.8 g/dL — AB (ref 3.5–5.0)
ANION GAP: 11 (ref 5–15)
BUN: 36 mg/dL — ABNORMAL HIGH (ref 6–20)
CALCIUM: 8.3 mg/dL — AB (ref 8.9–10.3)
CO2: 29 mmol/L (ref 22–32)
CREATININE: 5.68 mg/dL — AB (ref 0.44–1.00)
Chloride: 98 mmol/L — ABNORMAL LOW (ref 101–111)
GFR calc Af Amer: 7 mL/min — ABNORMAL LOW (ref >60)
GFR, EST NON AFRICAN AMERICAN: 6 mL/min — AB (ref >60)
Glucose, Bld: 110 mg/dL — ABNORMAL HIGH (ref 65–99)
POTASSIUM: 4.1 mmol/L (ref 3.5–5.1)
Phosphorus: 4.8 mg/dL — ABNORMAL HIGH (ref 2.5–4.6)
Sodium: 138 mmol/L (ref 135–145)

## 2016-09-17 LAB — HEMOGLOBIN A1C
Hgb A1c MFr Bld: 6 % — ABNORMAL HIGH (ref 4.8–5.6)
Mean Plasma Glucose: 126 mg/dL

## 2016-09-17 LAB — BASIC METABOLIC PANEL
Anion gap: 12 (ref 5–15)
BUN: 25 mg/dL — AB (ref 6–20)
CO2: 29 mmol/L (ref 22–32)
CREATININE: 4.19 mg/dL — AB (ref 0.44–1.00)
Calcium: 8.3 mg/dL — ABNORMAL LOW (ref 8.9–10.3)
Chloride: 98 mmol/L — ABNORMAL LOW (ref 101–111)
GFR calc Af Amer: 10 mL/min — ABNORMAL LOW (ref >60)
GFR calc non Af Amer: 9 mL/min — ABNORMAL LOW (ref >60)
GLUCOSE: 108 mg/dL — AB (ref 65–99)
Potassium: 4.1 mmol/L (ref 3.5–5.1)
SODIUM: 139 mmol/L (ref 135–145)

## 2016-09-17 LAB — SURGICAL PCR SCREEN
MRSA, PCR: NEGATIVE
Staphylococcus aureus: NEGATIVE

## 2016-09-17 LAB — CBC
HCT: 32.5 % — ABNORMAL LOW (ref 36.0–46.0)
HCT: 33.2 % — ABNORMAL LOW (ref 36.0–46.0)
Hemoglobin: 10.4 g/dL — ABNORMAL LOW (ref 12.0–15.0)
Hemoglobin: 10.4 g/dL — ABNORMAL LOW (ref 12.0–15.0)
MCH: 29.2 pg (ref 26.0–34.0)
MCH: 28.7 pg (ref 26.0–34.0)
MCHC: 32 g/dL (ref 30.0–36.0)
MCHC: 31.3 g/dL (ref 30.0–36.0)
MCV: 91.3 fL (ref 78.0–100.0)
MCV: 91.5 fL (ref 78.0–100.0)
PLATELETS: 213 10*3/uL (ref 150–400)
PLATELETS: 229 10*3/uL (ref 150–400)
RBC: 3.56 MIL/uL — ABNORMAL LOW (ref 3.87–5.11)
RBC: 3.63 MIL/uL — ABNORMAL LOW (ref 3.87–5.11)
RDW: 19.3 % — ABNORMAL HIGH (ref 11.5–15.5)
RDW: 19.5 % — ABNORMAL HIGH (ref 11.5–15.5)
WBC: 7 10*3/uL (ref 4.0–10.5)
WBC: 6.4 10*3/uL (ref 4.0–10.5)

## 2016-09-17 LAB — PROTIME-INR
INR: 1.07
PROTHROMBIN TIME: 13.9 s (ref 11.4–15.2)

## 2016-09-17 LAB — GLUCOSE, CAPILLARY
GLUCOSE-CAPILLARY: 121 mg/dL — AB (ref 65–99)
Glucose-Capillary: 78 mg/dL (ref 65–99)
Glucose-Capillary: 73 mg/dL (ref 65–99)

## 2016-09-17 LAB — APTT: aPTT: 35 seconds (ref 24–36)

## 2016-09-17 SURGERY — HEMIARTHROPLASTY, HIP, DIRECT ANTERIOR APPROACH, FOR FRACTURE
Anesthesia: Choice | Laterality: Left

## 2016-09-17 MED ORDER — INSULIN ASPART 100 UNIT/ML ~~LOC~~ SOLN
0.0000 [IU] | Freq: Three times a day (TID) | SUBCUTANEOUS | Status: DC
  Administered 2016-09-19 (×2): 3 [IU] via SUBCUTANEOUS
  Administered 2016-09-20: 1 [IU] via SUBCUTANEOUS
  Administered 2016-09-20: 2 [IU] via SUBCUTANEOUS
  Administered 2016-09-21: 1 [IU] via SUBCUTANEOUS

## 2016-09-17 MED ORDER — TRANEXAMIC ACID 1000 MG/10ML IV SOLN
1000.0000 mg | INTRAVENOUS | Status: AC
  Administered 2016-09-18: 1000 mg via INTRAVENOUS
  Filled 2016-09-17 (×2): qty 10

## 2016-09-17 MED ORDER — CEFAZOLIN SODIUM-DEXTROSE 2-4 GM/100ML-% IV SOLN
2.0000 g | INTRAVENOUS | Status: AC
  Administered 2016-09-18: 2 g via INTRAVENOUS
  Filled 2016-09-17 (×2): qty 100

## 2016-09-17 MED ORDER — DEXTROSE 5 % IV SOLN
1.0000 g | INTRAVENOUS | Status: DC
  Administered 2016-09-17 – 2016-09-21 (×4): 1 g via INTRAVENOUS
  Filled 2016-09-17 (×5): qty 10

## 2016-09-17 MED ORDER — POVIDONE-IODINE 10 % EX SWAB
2.0000 "application " | Freq: Once | CUTANEOUS | Status: AC
  Administered 2016-09-17: 2 via TOPICAL

## 2016-09-17 NOTE — Progress Notes (Signed)
Patient ID: [name], female   DOB: 22-Jun-1931, 83 y.o.   MRN: [id number] I have meet the patient and her son at the bedside.  She did get a cardiac consultation this afternoon.  We plan to proceed to surgery tomorrow morning for a left partial hip replacement to treat her left hip fracture.  There risks and benefits have been discussed in detail.  She is high to moderate risk given her co-morbidities and this is conveyed to her and her son.

## 2016-09-17 NOTE — Progress Notes (Signed)
OT Cancellation Note  Patient Details Name: PAISLYNN HEGSTROM MRN: [id number] DOB: 07-22-1931   Cancelled Treatment:    Reason Eval/Treat Not Completed: Patient not medically ready. Pt awaiting surgical fixation. Will follow for OT evaluation at next appropriate time. Thanks!  Norman Herrlich, MS OTR/L  Pager: [phone]   Norman Herrlich 09/17/2016, 9:19 AM

## 2016-09-17 NOTE — Progress Notes (Signed)
PT Cancellation Note  Patient Details Name: [name] MRN: [id number] DOB: 1931/07/10   Cancelled Treatment:    Reason Eval/Treat Not Completed: Medical issues which prohibited therapy (pt awaiting surgical fixation. Will plan to evaluate as appropriate next date)   Trey Bebee B Rhys Anchondo 09/17/2016, 7:09 AM  Elwyn Reach, [place]

## 2016-09-17 NOTE — Progress Notes (Signed)
[facility] PROGRESS NOTE  [name] NOB:[id number] DOB: [birth year] DOA: 09/16/2016   PCP: [name], MD     Assessment/Plan: Active Problems:   Hyperlipidemia   Hypertension   Chronic Facial droop   ESRD on dialysis ([place])   GERD (gastroesophageal reflux disease)   Mood disorder (HCC)   Systolic murmur   Imbalance   GAVE (gastric antral vascular ectasia)   Exertional dyspnea   CAD-severe multivessel, not CABG candidate   Forehead laceration   Chronic respiratory failure with hypoxia, on home O2 therapy (HCC)   Iron deficiency anemia due to chronic blood loss   S/p left hip fracture   Hyperglycemia   Hip fracture (HCC)   Closed fracture of neck of left femur (HCC)   Congestive heart failure (CHF) ([place])    83 y.o. female with  Extensive medical history including  ESRD on hemodialysis Tuesday Thursday Saturday,  Osteoporosis, hypertension, hyperlipidemia, CAD, chronic anemia, presenting with one to 2 months history of left hip pain, initially diagnosed with bursitis. She had a progressive decline of ambulation, and increase left hip pain, with weight bearing, and trying to walk.  left hip x-ray -Left femoral neck fracture with mild varus angulation appears acute versus subacute.   Assessment and plan Left fem neck Fracture    will need hemiarthroplasty of the left, by Dr. Erlinda Hong.   Continue telemetry, Pain control Hold ASA PT/OT after surgery Cardiology consulted for preop clearance given extensive cardiac history  ESRD on HD    Tuesday Thursday Saturday  Primary Nephrologist Dr. Blanchard Mane Nephrology following     Multivessel CAD    last cardiac catheterization 03/2015 on medical RX . Felt not to be a candidate for PCI or bypass surgery Continue ASA,  after surgery  high dose statin, beta blockers most recent 2-D echo 07/15/16, EF 65-70%, shows pulmonary hypertension  Hypertension  Controlled Continue home anti-hypertensive medications after  HD     Hyperlipidemia Continue home statins  GERD, no acute symptoms Continue PPI  Anemia of chronic disease Hemoglobin on admission  11.2 at baseline  Hemoglobin down to 10.4, continue to follow serially Receives Aranesp as OP    Hyperglycemia, patient denies H/O DM, current glu 119  Hemoglobin A1c 6.0, continue Accu-Cheks, start patient on sliding scale insulin  Chronic respiratory failure with hypoxia, wears 2 L home O2 prn, no diagnosis of COPD. NO Tobacco., NAD. CXR without infiltrate  O2 in the low 90s in RA, continue oxygen via [place],    UTI Patient initiated on Rocephin    DVT prophylaxsis heparin  Code Status:  Full code   Family Communication: Discussed in detail with the patient, all imaging results, lab results explained to the patient   Disposition Plan:  As above       Consultants:  Orthopedics  Nephrology  Procedures:  None  Antibiotics: Anti-infectives    None         HPI/Subjective: Patient unable to provide any meaningful history however does not endorse any chest pain or shortness of breath, denies any significant pain in the left hip  Objective: Vitals:   09/16/16 2300 09/16/16 2333 09/16/16 2336 09/17/16 0545  BP: (!) 110/57 113/62 127/71 (!) 157/60  Pulse: 76 75 75 76  Resp:  18  18  Temp:  97.9 F (36.6 C)  98.1 F (36.7 C)  TempSrc:    Oral  SpO2:    99%  Weight:  52.3 kg (115 lb 4.8  oz)    Height:  5\' 2"  (1.575 m)      Intake/Output Summary (Last 24 hours) at 09/17/16 0933 Last data filed at 09/17/16 0400  Gross per 24 hour  Intake              690 ml  Output             2000 ml  Net            -1310 ml    Exam:  Examination:  General exam: Appears calm and comfortable  Respiratory system: Clear to auscultation. Respiratory effort normal. Cardiovascular system: S1 & S2 heard, RRR. No JVD, murmurs, rubs, gallops or clicks. No pedal edema. Gastrointestinal system: Abdomen is nondistended, soft and  nontender. No organomegaly or masses felt. Normal bowel sounds heard. Central nervous system: Alert and oriented. No focal neurological deficits. Extremities: Symmetric 5 x 5 power. Skin: No rashes, lesions or ulcers Psychiatry: Judgement and insight appear normal. Mood & affect appropriate.     Data Reviewed: I have personally reviewed following labs and imaging studies  Micro Results Recent Results (from the past 240 hour(s))  Surgical pcr screen     Status: None   Collection Time: 09/17/16  2:04 AM  Result Value Ref Range Status   MRSA, PCR NEGATIVE NEGATIVE Final   Staphylococcus aureus NEGATIVE NEGATIVE Final    Comment:        The Xpert SA Assay (FDA approved for NASAL specimens in patients over 68 years of age), is one component of a comprehensive surveillance program.  Test performance has been validated by [facility] for patients greater than or equal to 31 year old. It is not intended to diagnose infection nor to guide or monitor treatment.     Radiology Reports Dg Chest Port 1 View  Result Date: 09/16/2016 CLINICAL DATA:  Preop for left hip fracture repair. EXAM: PORTABLE CHEST 1 VIEW COMPARISON:  Radiographs of June 16, 2016. FINDINGS: Stable cardiomegaly and central pulmonary vascular congestion. Atherosclerosis of thoracic aorta is noted. No pneumothorax or pleural effusion is noted. Stable elevated right hemidiaphragm is noted. No consolidative process is noted. Bony thorax is unremarkable. IMPRESSION: Stable cardiomegaly and central pulmonary vascular congestion. Aortic atherosclerosis. No consolidative process is noted. Electronically Signed   By: Marijo Conception, M.D.   On: 09/16/2016 14:17   Dg Hip Unilat W Or W/o Pelvis 2-3 Views Left  Result Date: 09/16/2016 CLINICAL DATA:  83 year old female with 6 weeks of left hip pain, no known injury. EXAM: DG HIP (WITH OR WITHOUT PELVIS) 2-3V LEFT COMPARISON:  PET-CT 07/26/2005 FINDINGS: Aortoiliac calcified  atherosclerosis. Bilateral calcified femoral artery atherosclerosis. Osteopenia. The pelvis appears intact. Grossly intact proximal right femur. The left femoral head is normally located. There is a left femoral neck fracture with mild varus angulation, no other displacement. The proximal left femur intertrochanteric segment appears to remain intact. Proximal left femoral shaft is intact. IMPRESSION: 1. Left femoral neck fracture with mild varus angulation appears acute versus subacute. 2. Osteopenia.  No other acute osseous abnormality identified. 3. Calcified aortic and iliofemoral atherosclerosis. Electronically Signed   By: Genevie Ann M.D.   On: 09/16/2016 12:50     CBC  Recent Labs Lab 09/16/16 1419 09/17/16 0610  WBC 6.5 7.0  HGB 11.2* 10.4*  HCT 34.8* 32.5*  PLT 231 213  MCV 89.2 91.3  MCH 28.7 29.2  MCHC 32.2 32.0  RDW 18.9* 19.3*    Chemistries   Recent Labs  Lab 09/16/16 1419 09/17/16 0610  NA 139 139  K 4.3 4.1  CL 96* 98*  CO2 25 29  GLUCOSE 119* 108*  BUN 58* 25*  CREATININE 7.07* 4.19*  CALCIUM 8.9 8.3*   ------------------------------------------------------------------------------------------------------------------ estimated creatinine clearance is 8.2 mL/min (A) (by C-G formula based on SCr of 4.19 mg/dL (H)). ------------------------------------------------------------------------------------------------------------------  Recent Labs  09/16/16 1700  HGBA1C 6.0*   ------------------------------------------------------------------------------------------------------------------ No results for input(s): CHOL, HDL, LDLCALC, TRIG, CHOLHDL, LDLDIRECT in the last 72 hours. ------------------------------------------------------------------------------------------------------------------ No results for input(s): TSH, T4TOTAL, T3FREE, THYROIDAB in the last 72 hours.  Invalid input(s):  FREET3 ------------------------------------------------------------------------------------------------------------------ No results for input(s): VITAMINB12, FOLATE, FERRITIN, TIBC, IRON, RETICCTPCT in the last 72 hours.  Coagulation profile  Recent Labs Lab 09/16/16 1419 09/17/16 0610  INR 1.03 1.07    No results for input(s): DDIMER in the last 72 hours.  Cardiac Enzymes  Recent Labs Lab 09/16/16 1656  TROPONINI 0.05*   ------------------------------------------------------------------------------------------------------------------ Invalid input(s): POCBNP   CBG: No results for input(s): GLUCAP in the last 168 hours.     Studies: Dg Chest Port 1 View  Result Date: 09/16/2016 CLINICAL DATA:  Preop for left hip fracture repair. EXAM: PORTABLE CHEST 1 VIEW COMPARISON:  Radiographs of June 16, 2016. FINDINGS: Stable cardiomegaly and central pulmonary vascular congestion. Atherosclerosis of thoracic aorta is noted. No pneumothorax or pleural effusion is noted. Stable elevated right hemidiaphragm is noted. No consolidative process is noted. Bony thorax is unremarkable. IMPRESSION: Stable cardiomegaly and central pulmonary vascular congestion. Aortic atherosclerosis. No consolidative process is noted. Electronically Signed   By: Marijo Conception, M.D.   On: 09/16/2016 14:17   Dg Hip Unilat W Or W/o Pelvis 2-3 Views Left  Result Date: 09/16/2016 CLINICAL DATA:  83 year old female with 6 weeks of left hip pain, no known injury. EXAM: DG HIP (WITH OR WITHOUT PELVIS) 2-3V LEFT COMPARISON:  PET-CT 07/26/2005 FINDINGS: Aortoiliac calcified atherosclerosis. Bilateral calcified femoral artery atherosclerosis. Osteopenia. The pelvis appears intact. Grossly intact proximal right femur. The left femoral head is normally located. There is a left femoral neck fracture with mild varus angulation, no other displacement. The proximal left femur intertrochanteric segment appears to remain intact.  Proximal left femoral shaft is intact. IMPRESSION: 1. Left femoral neck fracture with mild varus angulation appears acute versus subacute. 2. Osteopenia.  No other acute osseous abnormality identified. 3. Calcified aortic and iliofemoral atherosclerosis. Electronically Signed   By: Genevie Ann M.D.   On: 09/16/2016 12:50      Lab Results  Component Value Date   HGBA1C 6.0 (H) 09/16/2016   HGBA1C 5.4 09/23/2014   HGBA1C 4.8 04/09/2014   Lab Results  Component Value Date   LDLCALC 42 06/19/2015   CREATININE 4.19 (H) 09/17/2016       Scheduled Meds: . amLODipine  5 mg Oral QHS  . aspirin EC  81 mg Oral Daily  . atorvastatin  80 mg Oral QHS  . calcitRIOL  0.25 mcg Oral Q T,Th,Sa-HD  . calcium carbonate  3,125 mg Oral TID WC  . heparin  5,000 Units Subcutaneous Q8H  . metoprolol tartrate  12.5 mg Oral BID  .  morphine injection  4 mg Intravenous Once  . multivitamin  1 tablet Oral QHS  . ondansetron (ZOFRAN) IV  4 mg Intravenous Once  . pantoprazole  40 mg Oral BID   Continuous Infusions: . sodium chloride 50 mL/hr at 09/17/16 0045  . [START ON 09/18/2016] ferric gluconate (FERRLECIT/NULECIT) IV Stopped (09/16/16 3710)  LOS: 1 day    Time spent: >30 MINS    Reyne Dumas  [facility] Pager [phone]. If 7PM-7AM, please contact night-coverage at [email or web address], password [facility] 09/17/2016, 9:33 AM  LOS: 1 day

## 2016-09-17 NOTE — Progress Notes (Signed)
RN notified Dr Allyson Sabal that OR called and stated they would be sending for the patient shortly for her scheduled surgery. Dr Allyson Sabal had notified RN prior into shift that she had notified cardiology to see patient. Dr Allyson Sabal stated that she would notify cardiology that patient would be heading to the OR.

## 2016-09-17 NOTE — Progress Notes (Signed)
Short stay nurse notified RN that anesthesiologist stated that patient would need to be cleared by cardiology prior to surgery. Short stay nurse stated she would notify Dr Erlinda Hong. RN notified Dr Allyson Sabal.

## 2016-09-17 NOTE — Anesthesia Preprocedure Evaluation (Deleted)
Anesthesia Evaluation  Patient identified by MRN, date of birth, ID band Patient awake    Reviewed: Allergy & Precautions, NPO status , Patient's Chart, lab work & pertinent test results  Airway Mallampati: III  TM Distance: >3 FB Neck ROM: Full    Dental  (+) Edentulous Upper, Edentulous Lower   Pulmonary former smoker,  Oxygen dependent    breath sounds clear to auscultation- rhonchi       Cardiovascular hypertension, Pt. on medications and Pt. on home beta blockers + CAD (Severe 3vessel CAD- high risk CABG candidate- medical management. Normal EF), + Past MI and +CHF   Rhythm:Regular Rate:Normal  ECG: SR, rate 79   ECHO: Left ventricle: The cavity size was normal. There was mild concentric hypertrophy. Systolic function was vigorous. The estimated ejection fraction was in the range of 65% to 70%. Wall motion was normal; there were no regional wall motion abnormalities. Aortic valve: There was trivial regurgitation. Mitral valve: Moderately calcified annulus. Mildly thickened leaflets . There was trivial regurgitation. Left atrium: The atrium was severely dilated. Volume/bsa, ES,   (1-plane Simpson&'s, A2C): 70.4 ml/m^2. - Right ventricle: The cavity size was mildly dilated. Wall   thickness was normal. Systolic function was mildly reduced. Right atrium: The atrium was mildly dilated. Tricuspid valve: There was mild regurgitation. Pulmonary arteries: Systolic pressure was severely increased. PA peak pressure: 85 mm Hg (S).   Neuro/Psych Anxiety negative neurological ROS     GI/Hepatic Neg liver ROS, GERD  ,  Endo/Other  negative endocrine ROS  Renal/GU ESRF and DialysisRenal diseaseOn HD, M, W, F  negative genitourinary   Musculoskeletal  (+) Arthritis ,   Abdominal Normal abdominal exam  (+)   Peds  Hematology  (+) anemia ,   Anesthesia Other Findings Pulm HTN Hyperlipidemia Plt: 213 INR: 1.07   Reproductive/Obstetrics                            Lab Results  Component Value Date   CREATININE 4.19 (H) 09/17/2016   BUN 25 (H) 09/17/2016   NA 139 09/17/2016   K 4.1 09/17/2016   CL 98 (L) 09/17/2016   CO2 29 09/17/2016   Lab Results  Component Value Date   WBC 7.0 09/17/2016   HGB 10.4 (L) 09/17/2016   HCT 32.5 (L) 09/17/2016   MCV 91.3 09/17/2016   PLT 213 09/17/2016    Anesthesia Physical  Anesthesia Plan  ASA: IV  Anesthesia Plan: Spinal   Post-op Pain Management:    Induction: Intravenous  PONV Risk Score and Plan: 2 and Ondansetron, Dexamethasone and Propofol  Airway Management Planned:   Additional Equipment:   Intra-op Plan:   Post-operative Plan:   Informed Consent: I have reviewed the patients History and Physical, chart, labs and discussed the procedure including the risks, benefits and alternatives for the proposed anesthesia with the patient or authorized representative who has indicated his/her understanding and acceptance.     Plan Discussed with: CRNA  Anesthesia Plan Comments:         Anesthesia Quick Evaluation

## 2016-09-17 NOTE — Consult Note (Signed)
Cardiology Consult    Patient ID: [name] MRN: [id number], DOB/AGE: 1932-03-28   Admit date: 09/16/2016 Date of Consult: 09/17/2016  Primary Physician: [name], MD Primary Cardiologist: Dr. Stanford Breed Requesting Provider: Dr. Allyson Sabal  Reason for Consult: preoperative risk evaluation  Patient Profile    [name] has a PMH significant for CAD that is medically managed, ESRD on HD ([name]), HTN, HLD, pulmonary hypertension, and anemia. She presented to [facility] with left hip pain and was found to have a left femoral neck fracture.  [name] is a 83 y.o. female who is being seen today for the evaluation of preoperative cardiac risk evaluation at the request of Dr. Allyson Sabal.   Past Medical History   Past Medical History:  Diagnosis Date  . Anemia in chronic kidney disease    IV iron infusion and anemia from GAVE blood loss.  . Arthritis     hands  . Bradycardia 2014   a. Noted 06/2012.  . Breast cancer ([place])    Left Breast 13 yrs. ago, RADIATION TX DONE  . CAD (coronary artery disease), native coronary artery    a. 03/2015 Cath: LAD 32m, D2 75, LCX 80, OM1 85, RI 99, RCA 70p/m, 95d, AM 85-->poor candidate for CABG & PCI-->Med Rx.  . Cardiac murmur    a. thought due to AVF (2D echo 06/2012 without significant valvular disease).   . Carotid arterial disease ([place])    a. 04/2015 Carotid U/S: 1-39% bilat ICA stenosis.  Marland Kitchen Ectropion of right lower eyelid 11/2012   s/p surgery (Dr. [name])  . End stage renal disease on dialysis [facility])    HD M,W,F [place] AVF now LUE AVF HD X 4 YRS   . Facial droop 1935   acquired during forceps delivery, some residual R visual loss  . Family history of adverse reaction to anesthesia    brother trouble waking up and confusion, now deceased  . GAVE (gastric antral vascular ectasia) 08/2014   s/p EGD with APC  . GERD (gastroesophageal reflux disease)   . Hearing loss    Bilateral hearing aids  . History of blood transfusion  5 weeks ago  . History of home oxygen therapy    2 LITERS  OXYGEN PRN WHEN WALKING  . Hx of breast cancer 2004  . Hyperlipidemia   . Hypertension   . Hypotension    a. Associated w/ dialysis.  . Moderate mitral regurgitation    a. 05/2015 Echo: EF 55-60%, no rwma, triv AI, Ao sclerosis, mod MR, mildly to mod dil LA, PASP 55mmHg.  . Osteoporosis 11/2013   DEXA -3.4 radius IN BACK  . Sight impaired    Left: wears contact. Right: diminished peripheral vision    Past Surgical History:  Procedure Laterality Date  . AV FISTULA PLACEMENT  07/20/10   Right brachiocephalic AVF  . BASCILIC VEIN TRANSPOSITION Left 07/24/2013   Procedure: BASILIC VEIN TRANSPOSITION;  Surgeon: [name], MD;  Location: [place];  Service: Vascular;  Laterality: Left;  . BREAST BIOPSY Left   . BREAST LUMPECTOMY Left   . CARDIAC CATHETERIZATION N/A 04/10/2015   Procedure: Left Heart Cath and Coronary Angiography;  Surgeon: [name], MD;  Location: [place] CV LAB;  Service: Cardiovascular;  Laterality: N/A;  . CATARACT EXTRACTION Bilateral 1980  . DEXA  11/2015   DEXA T -4.3 [facility]  . ESOPHAGOGASTRODUODENOSCOPY N/A 04/09/2014   Procedure: ESOPHAGOGASTRODUODENOSCOPY (EGD);  Surgeon: [name], MD;  Location: [facility];  Service: Endoscopy;  Laterality: N/A;  . ESOPHAGOGASTRODUODENOSCOPY N/A 09/23/2014   Procedure: ESOPHAGOGASTRODUODENOSCOPY (EGD);  Surgeon: [name], MD;  Location: [place];  Service: Endoscopy;  Laterality: N/A;  . ESOPHAGOGASTRODUODENOSCOPY N/A 12/26/2014   neg H pylori, benign biopsies; ESOPHAGOGASTRODUODENOSCOPY (EGD);  Surgeon: [name], MD  . ESOPHAGOGASTRODUODENOSCOPY N/A 06/15/2015   Procedure: ESOPHAGOGASTRODUODENOSCOPY (EGD);  Surgeon: Milus Banister, MD;  Location: [place];  Service: Endoscopy;  Laterality: N/A;  . ESOPHAGOGASTRODUODENOSCOPY N/A 06/27/2015   Procedure: ESOPHAGOGASTRODUODENOSCOPY (EGD);  Surgeon: Mauri Pole, MD;  Location: [facility];  Service: Endoscopy;  Laterality: N/A;  . ESOPHAGOGASTRODUODENOSCOPY (EGD) WITH PROPOFOL N/A 11/12/2014   Procedure: ESOPHAGOGASTRODUODENOSCOPY (EGD) WITH PROPOFOL;  Surgeon: [name], MD;  Location: [facility];  Service: Endoscopy;  Laterality: N/A;  . ESOPHAGOGASTRODUODENOSCOPY (EGD) WITH PROPOFOL N/A 07/31/2015   Procedure: ESOPHAGOGASTRODUODENOSCOPY (EGD) WITH PROPOFOL;  Surgeon: Mauri Pole, MD;  Location: [facility];  Service: Endoscopy;  Laterality: N/A;  . ESOPHAGOGASTRODUODENOSCOPY (EGD) WITH PROPOFOL N/A 03/08/2016   Procedure: ESOPHAGOGASTRODUODENOSCOPY (EGD) WITH PROPOFOL;  Surgeon: Mauri Pole, MD;  Location: [facility];  Service: Endoscopy;  Laterality: N/A;  . ESOPHAGOGASTRODUODENOSCOPY (EGD) WITH PROPOFOL N/A 07/01/2016   Procedure: ESOPHAGOGASTRODUODENOSCOPY (EGD) WITH PROPOFOL;  Surgeon: Mauri Pole, MD;  Location: [facility];  Service: Endoscopy;  Laterality: N/A;  . EYE SURGERY  1966   regular cataract  . HERNIA REPAIR    . HOT HEMOSTASIS N/A 11/12/2014   Procedure: HOT HEMOSTASIS (ARGON PLASMA COAGULATION/BICAP);  Surgeon: [name], MD;  Location: [facility];  Service: Endoscopy;  Laterality: N/A;  . HOT HEMOSTASIS N/A 06/27/2015   Procedure: HOT HEMOSTASIS (ARGON PLASMA COAGULATION/BICAP);  Surgeon: Mauri Pole, MD;  Location: [facility];  Service: Endoscopy;  Laterality: N/A;  . HOT HEMOSTASIS N/A 07/31/2015   Procedure: HOT HEMOSTASIS (ARGON PLASMA COAGULATION/BICAP);  Surgeon: Mauri Pole, MD;  Location: [facility];  Service: Endoscopy;  Laterality: N/A;  . HOT HEMOSTASIS N/A 07/01/2016   Procedure: HOT HEMOSTASIS (ARGON PLASMA COAGULATION/BICAP);  Surgeon: Mauri Pole, MD;  Location: [facility];  Service: Endoscopy;  Laterality: N/A;  . PERIPHERAL VASCULAR CATHETERIZATION N/A 08/15/2014   Procedure: A/V Shuntogram/Fistulagram;  Surgeon: [name], MD;  Location: [place] CV LAB;  Service:  Cardiovascular;  Laterality: N/A;  . PERIPHERAL VASCULAR CATHETERIZATION N/A 04/28/2015   Procedure: A/V Shuntogram/Fistulagram;  Surgeon: [name], MD;  Location: [place] CV LAB;  Service: Cardiovascular;  Laterality: N/A;  . PERIPHERAL VASCULAR CATHETERIZATION Left 04/28/2015   Procedure: A/V Shunt Intervention;  Surgeon: [name], MD;  Location: [place] CV LAB;  Service: Cardiovascular;  Laterality: Left;  . PERIPHERAL VASCULAR CATHETERIZATION Left 08/07/2015   Procedure: A/V Shuntogram/Fistulagram;  Surgeon: [name], MD;  Location: [place] CV LAB;  Service: Cardiovascular;  Laterality: Left;  . PERIPHERAL VASCULAR CATHETERIZATION N/A 08/07/2015   Procedure: A/V Shunt Intervention;  Surgeon: [name], MD;  Location: [place] CV LAB;  Service: Cardiovascular;  Laterality: N/A;  . PERIPHERAL VASCULAR CATHETERIZATION Left 11/03/2015   Procedure: A/V Shuntogram/Fistulagram;  Surgeon: [name], MD;  Location: [place] CV LAB;  Service: Cardiovascular;  Laterality: Left;  . PERIPHERAL VASCULAR CATHETERIZATION N/A 11/03/2015   Procedure: A/V Shunt Intervention;  Surgeon: [name], MD;  Location: [place] CV LAB;  Service: Cardiovascular;  Laterality: N/A;  . PERIPHERAL VASCULAR CATHETERIZATION N/A 03/19/2016   Procedure: A/V Fistulagram;  Surgeon: Katha Cabal, MD;  Location: [place] CV LAB;  Service: Cardiovascular;  Laterality: N/A;  . PERIPHERAL VASCULAR CATHETERIZATION N/A 03/30/2016   Procedure: A/V Fistulagram;  Surgeon: Katha Cabal, MD;  Location: [place] CV LAB;  Service: Cardiovascular;  Laterality: N/A;  . PERIPHERAL VASCULAR CATHETERIZATION N/A 03/30/2016   Procedure: A/V Shunt Intervention;  Surgeon: Katha Cabal, MD;  Location: [place] CV LAB;  Service: Cardiovascular;  Laterality: N/A;  . SHUNTOGRAM N/A 04/16/2011   Procedure: [place] Mallet;  Surgeon: [name], MD;  Location: [facility] CATH LAB;  Service:  Cardiovascular;  Laterality: N/A;  . SHUNTOGRAM Right 07/05/2013   Procedure: FISTULOGRAM;  Surgeon: [name], MD;  Location: [facility] CATH LAB;  Service: Cardiovascular;  Laterality: Right;  . UMBILICAL HERNIA REPAIR  2011  . US ECHOCARDIOGRAPHY  06/2012   LVH, EF 65%, grade 1 diastol dysfunction, mod dliated LAD     Allergies  Allergies  Allergen Reactions  . Sulfa Antibiotics Rash and Other (See Comments)    fever    History of Present Illness    [name] is known to this service and last saw Dr.Crenshaw in clinic on 06/01/16 and Ignacia Bayley PA-C in clinic on 06/30/16.  At that time, she was in her usual state of health with the exception of dyspnea on exertion. She had an abnormal stress test in 11/2014 and subsequent heart catheterization 03/2015 with 3-v disease. She was evaluated by CTS who thought she was too high risk for CABG. She was also deemed too high risk for PCI. She has been medically managed. Echo in 05/2015 with normal LVEF but worsening pulmonary hypertension. Earlier this year, she c/o DOE thought to be mediated by her CAD an mild LE swelling. Hypotension during HD limited anti-anginal medication options. Her dry weight was lowered by 1 kg at HD, which relieved her symptoms. She is also now on home oxygen.   She presented to [facility] with a one month history of left hip pain. She was found to have a left femoral neck fracture. Orthopedic surgery was consulted and they requested a cardiology consult for cardiac risk evaluation during the perioperative period.   On my interview, she denies recent or current chest pain. She lives alone and does not require assistance with her ADLs. She is compliant on all of her medications and HD.  Inpatient Medications    . amLODipine  5 mg Oral QHS  . aspirin EC  81 mg Oral Daily  . atorvastatin  80 mg Oral QHS  . calcitRIOL  0.25 mcg Oral Q T,Th,Sa-HD  . calcium carbonate  3,125 mg Oral TID WC  . heparin  5,000 Units Subcutaneous Q8H  .  insulin aspart  0-9 Units Subcutaneous TID WC  . metoprolol tartrate  12.5 mg Oral BID  .  morphine injection  4 mg Intravenous Once  . multivitamin  1 tablet Oral QHS  . ondansetron (ZOFRAN) IV  4 mg Intravenous Once  . pantoprazole  40 mg Oral BID     Outpatient Medications    Prior to Admission medications   Medication Sig Start Date End Date Taking? Authorizing Provider  acetaminophen (TYLENOL) 500 MG tablet Take 500 mg by mouth daily as needed for moderate pain.    Yes [provider]  amLODipine (NORVASC) 5 MG tablet Take 1 tablet (5 mg total) by mouth at bedtime. 06/01/16  Yes Lelon Perla, MD  aspirin EC 81 MG tablet Take 1 tablet (81 mg total) by mouth daily. 03/06/15  Yes Lelon Perla, MD  atorvastatin (LIPITOR) 80 MG tablet Take 1 tablet (80 mg total) by mouth at bedtime. 06/01/16  Yes Lelon Perla, MD  B Complex-C-Folic Acid (RENA-VITE PO) Take 1 tablet by mouth daily with breakfast.    Yes [provider]  Calcium Carbonate Antacid (TUMS ULTRA 1000 PO) Take 3,000 mg by mouth 3 (three) times daily after meals.    Yes [provider]  cholecalciferol (VITAMIN D) 1000 UNITS tablet Take 1,000 Units by mouth every Monday, Wednesday, and Friday.    Yes [provider]  metoprolol tartrate (LOPRESSOR) 25 MG tablet Take 1 tablet (25 mg total) by mouth 2 (two) times daily. Patient taking differently: Take 12.5 mg by mouth 2 (two) times daily. TAKE 1/2 TABLET (12.5 MG) IN THE MORNING AND 1/2 TABLET (12.5 MG) AT NIGHT. 06/01/16  Yes Crenshaw, Denice Bors, MD  naproxen sodium (ANAPROX) 220 MG tablet Take 220 mg by mouth as needed (pain). 12 hour relief   Yes [provider]  NON FORMULARY Oxygen - 2 liters as needed   Yes [provider]  pantoprazole (PROTONIX) 40 MG tablet TAKE 1 TABLET BY MOUTH TWICE A DAY Patient taking differently: take 80mg s in the morning 03/15/16  Yes Nandigam, Venia Minks, MD  Polyethyl Glycol-Propyl Glycol  (SYSTANE) 0.4-0.3 % GEL ophthalmic gel Place 1 application into the right eye every 6 (six) hours as needed (eye dryness or pain).   Yes [provider]  ondansetron (ZOFRAN) 4 MG tablet Take 1 tablet (4 mg total) by mouth every 8 (eight) hours as needed for nausea or vomiting. 06/23/16   [name], MD  traMADol (ULTRAM) 50 MG tablet Take 0.5 tablets (25 mg total) by mouth 2 (two) times daily as needed for moderate pain. 07/27/16   [name], MD     Family History     Family History  Problem Relation Age of Onset  . Cancer Father        stomach  . CAD Father        MI at age 2  . Heart disease Father   . Heart attack Father   . Hypertension Mother   . Heart disease Brother        Congenital  . Hypertension Brother   . Diabetes Brother   . Cancer Sister        female (uterus?)  . Colon cancer Neg Hx   . Esophageal cancer Neg Hx   . Stomach cancer Neg Hx     Social History    Social History   Social History  . Marital status: Single    Spouse name: N/A  . Number of children: N/A  . Years of education: N/A   Occupational History  . 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      retired   Social History Main Topics  . Smoking status: Former Smoker    Years: 1.00    Types: Cigarettes    Quit date: 03/29/1953  . Smokeless tobacco: Never Used  . Alcohol use 0.0 oz/week     Comment: occasional  . Drug use: No  . Sexual activity: No   Other Topics Concern  . Not on file   Social History Narrative   As of 03/2013. Lives alone with her small dog.  Bother and sister in law died last year.     Occupation: retired, was Information systems manager for [employer]   Edu: some college  Review of Systems    General:  No chills, fever, night sweats or weight changes.  Cardiovascular:  No chest pain, dyspnea on exertion, + edema, no orthopnea, palpitations, paroxysmal nocturnal dyspnea. Dermatological: No rash,  lesions/masses Respiratory: No cough, dyspnea Urologic: No hematuria, dysuria Abdominal:   No nausea, vomiting, diarrhea, bright red blood per rectum, melena, or hematemesis Neurologic:  No visual changes, changes in mental status. All other systems reviewed and are otherwise negative except as noted above.  Physical Exam    Blood pressure (!) 157/60, pulse 76, temperature 98.1 F (36.7 C), temperature source Oral, resp. rate 18, height 5\' 2"  (1.575 m), weight 115 lb 4.8 oz (52.3 kg), SpO2 99 %.  General: Pleasant, NAD Psych: Normal affect. Neuro: Alert and oriented X 3. Moves all extremities spontaneously. HEENT: Normal  Neck: Supple without bruits or JVD. Lungs:  Resp regular and unlabored, CTA. Heart: RRR no s3, s4, or murmurs. Abdomen: Soft, non-tender, non-distended, BS + x 4.  Extremities: No clubbing, cyanosis, 1+ edema. DP/PT/Radials 1+ and equal bilaterally.  Labs    Troponin (Point of Care Test) No results for input(s): TROPIPOC in the last 72 hours.  Recent Labs  09/16/16 1656  TROPONINI 0.05*   Lab Results  Component Value Date   WBC 7.0 09/17/2016   HGB 10.4 (L) 09/17/2016   HCT 32.5 (L) 09/17/2016   MCV 91.3 09/17/2016   PLT 213 09/17/2016    Recent Labs Lab 09/17/16 0610  NA 139  K 4.1  CL 98*  CO2 29  BUN 25*  CREATININE 4.19*  CALCIUM 8.3*  GLUCOSE 108*   Lab Results  Component Value Date   CHOL 122 06/19/2015   HDL 65 06/19/2015   LDLCALC 42 06/19/2015   TRIG 73 06/19/2015   No results found for: [facility]   Radiology Studies    Dg Chest Port 1 View  Result Date: 09/16/2016 CLINICAL DATA:  Preop for left hip fracture repair. EXAM: PORTABLE CHEST 1 VIEW COMPARISON:  Radiographs of June 16, 2016. FINDINGS: Stable cardiomegaly and central pulmonary vascular congestion. Atherosclerosis of thoracic aorta is noted. No pneumothorax or pleural effusion is noted. Stable elevated right hemidiaphragm is noted. No consolidative process is noted.  Bony thorax is unremarkable. IMPRESSION: Stable cardiomegaly and central pulmonary vascular congestion. Aortic atherosclerosis. No consolidative process is noted. Electronically Signed   By: Marijo Conception, M.D.   On: 09/16/2016 14:17   Dg Hip Unilat W Or W/o Pelvis 2-3 Views Left  Result Date: 09/16/2016 CLINICAL DATA:  83 year old female with 6 weeks of left hip pain, no known injury. EXAM: DG HIP (WITH OR WITHOUT PELVIS) 2-3V LEFT COMPARISON:  PET-CT 07/26/2005 FINDINGS: Aortoiliac calcified atherosclerosis. Bilateral calcified femoral artery atherosclerosis. Osteopenia. The pelvis appears intact. Grossly intact proximal right femur. The left femoral head is normally located. There is a left femoral neck fracture with mild varus angulation, no other displacement. The proximal left femur intertrochanteric segment appears to remain intact. Proximal left femoral shaft is intact. IMPRESSION: 1. Left femoral neck fracture with mild varus angulation appears acute versus subacute. 2. Osteopenia.  No other acute osseous abnormality identified. 3. Calcified aortic and iliofemoral atherosclerosis. Electronically Signed   By: Genevie Ann M.D.   On: 09/16/2016 12:50    ECG & Cardiac Imaging    EKG pending  Telemetry: NSR  Echocardiogram 07/15/16: Study Conclusions - Left ventricle: The cavity size was normal. There was mild   concentric hypertrophy. Systolic function was vigorous. The  estimated ejection fraction was in the range of 65% to 70%. Wall   motion was normal; there were no regional wall motion   abnormalities. - Aortic valve: There was trivial regurgitation. - Mitral valve: Moderately calcified annulus. Mildly thickened   leaflets . There was trivial regurgitation. - Left atrium: The atrium was severely dilated. Volume/bsa, ES,   (1-plane Simpson&'s, A2C): 70.4 ml/m^2. - Right ventricle: The cavity size was mildly dilated. Wall   thickness was normal. Systolic function was mildly reduced. -  Right atrium: The atrium was mildly dilated. - Tricuspid valve: There was mild regurgitation. - Pulmonary arteries: Systolic pressure was severely increased. PA   peak pressure: 85 mm Hg (S).  Impressions: - Pulmonary hypertension has increased since prior study.   Left heart catheterization 04/10/15: 1. Dist RCA lesion, 95% stenosed. 2. Post Atrio lesion, 95% stenosed. 3. Prox RCA to Mid RCA lesion, 70% stenosed. 4. Acute Mrg lesion, 85% stenosed. 5. Ost Ramus to Ramus lesion, 99% stenosed. 6. Prox Cx to Mid Cx lesion, 80% stenosed. 7. Ost 1st Mrg to 1st Mrg lesion, 85% stenosed. 8. Ost 2nd Diag to 2nd Diag lesion, 75% stenosed. 9. Mid LAD to Dist LAD lesion, 80% stenosed.    Severe three-vessel coronary disease involving the mid LAD, mid to distal RCA, and mid circumflex. Distal vessels are  Large enough to graft.  Normal left ventricular end-diastolic pressure.  Noninvasive EF of 50% in  Mid to late 2016.  Successful Angio-Seal used for hemostasis.    RECOMMENDATIONS:   Consider surgical consultation to determine if the patient is a bypass candidate. End stage renal disease and other co-morbidities increase risk of surgery and may disqualify her as a candidate.   Will depend upon Dr. Stanford Breed to make final decision about whether surgical referral his needed.   PCI would be complicated , involve multiple stents, and require long-term dual antiplatelet therapy. Therefore, this treatment would seem to be less desirable in absence of ACS/ refractory symptoms.   Assessment & Plan    Preoperative Cardiac Risk Assessment  This is a 83 yo female with a PMH significant for CAD. Last heart catheterization 03/2015 showed 3-vessel disease that is medically managed - she was not a candidate for CABG or PCI given her GAVE. Recent echocardiogram with normal LVEF, but worsened pulmonary hypertension. She uses home oxygen, although alterations to her HD regimen have improved her DOE.  According to the revised cardiac index, this is a moderate to high risk procedure in a patient with multiple risk factors. She has a 5.4% risk for cardiac death, nonfatal myocardial infarction, and nonfatal cardiac arrest. She has a 9.1% of myocardial infarction, pulmonary edema, ventricular fibrillation, primary cardiac arrest, and complete heart block. [name] lives at home and does not need assistance with ADLs. This hip surgery would likely improve her quality of life. She is a high risk patient for a moderate to high risk procedure.   Signed, Tami Lin Duke, PA-C 09/17/2016, 2:07 PM [phone] As above, patient seen and examined. Briefly she is an 83 year old female with past medical history of end-stage renal disease dialysis dependent, severe coronary disease treated medically, hypertension, hyperlipidemia, Gave with recurrent anemia and now with left femoral neck fracture for preoperative evaluation. Patient recently developed hip pain and has been diagnosed with hip fracture. She does not know how this occurred. She has not fallen. She has not had recent dyspnea, chest pain, palpitations or syncope. Electrocardiogram shows sinus rhythm, prior anterior infarct cannot be  excluded and prolonged QT.  1 preoperative evaluation prior to repair of femoral neck fracture-patient will be considered high risk for any procedure because of her age and comorbidities. She has severe three-vessel coronary disease (based on cath 1/17) and was felt not to be a candidate for PCI or coronary artery bypass graft. She is not having chest pain or dyspnea. She may proceed to the operating room with the understanding she is high risk. I explained this to her including risk of myocardial infarction and death and she would like to proceed. If she does not have her fracture repaired she will likely not be able to ambulate.  2 coronary artery disease-continue aspirin, statin, metoprolol and amlodipine.  3 end-stage  renal disease-dialysis per nephrology.  4 status post femoral neck fracture-managed by orthopedics.  Kirk Ruths, MD

## 2016-09-17 NOTE — Progress Notes (Signed)
Subjective:  Planning on surgery this afternoon.  Had HD last night, removed 2000 tolerated well  Objective Vital signs in last 24 hours: Vitals:   09/16/16 2300 09/16/16 2333 09/16/16 2336 09/17/16 0545  BP: (!) 110/57 113/62 127/71 (!) 157/60  Pulse: 76 75 75 76  Resp:  18  18  Temp:  97.9 F (36.6 C)  98.1 F (36.7 C)  TempSrc:    Oral  SpO2:    99%  Weight:  52.3 kg (115 lb 4.8 oz)    Height:  5\' 2"  (1.575 m)     Weight change:   Intake/Output Summary (Last 24 hours) at 09/17/16 1052 Last data filed at 09/17/16 0400  Gross per 24 hour  Intake              690 ml  Output             2000 ml  Net            -1310 ml    Dialysis Orders: [employer], T,Th,S 3 hrs. 160 NRe 350/600 49.5 kg 2.0 K/2.0 Ca  Linear Na/UF Profile 4 -No Heparin -Mircera 50 mcg IV Q 4 weeks (last dose 09/11/16 HGB 10.5 09/14/16 ) -Venofer 100 mg IV X 10 doses (has not been started yet-Fe 71 Tsat 27% Ferritin 169 09/08/16) -Calcitriol 0.25 mcg PO TIW (Last PTH 197 09/08/16)  BMD meds: TUMS 3000 mg PO TID AC (Last Ca 9.4 C Ca 9.6 phos 4.2 09/08/16)  Assessment/Plan: 1. L Hip Fracture: Orthopedic consult. Planning for operative repair this afternoon. Stable and comfortable at present. 2.  ESRD -  T,TH,S via LUA AVF. K+ 4.3.plan for HD tomorrow.  3.  Hypertension/volume  - Metoprolol 12.5 mg PO BID, Amlodipine 10 mg PO Q HS on OP med list. Vascular congestion noted on CXR, does not sound or look wet. Will challenge 0.5 and lower EDW slightly as tolerated. Getting IVF at 100 per hour , will stop  4.  Anemia  - HGB 11.2- down to 1.04 this AM, anticipate will go lower after surgery. Recent OP ESA dose. Follow HGB. Current order for Fe load appears excessive for Tsat 27%-change to venofer 100 mg IV X 3 doses.  5.  Metabolic bone disease -  Cont binders, VDRA. 6.  Nutrition - Renal diet, renal vit/nepro 7. H/O multi-vessel CAD: No chest pain reported. Was considered not a candidate for CABG in  past  8. H/O GI bleed: HGB stable, no recent issues.   Kayzen Kendzierski A    Labs: Basic Metabolic Panel:  Recent Labs Lab 09/16/16 1419 09/17/16 0610  NA 139 139  K 4.3 4.1  CL 96* 98*  CO2 25 29  GLUCOSE 119* 108*  BUN 58* 25*  CREATININE 7.07* 4.19*  CALCIUM 8.9 8.3*   Liver Function Tests: No results for input(s): AST, ALT, ALKPHOS, BILITOT, PROT, ALBUMIN in the last 168 hours. No results for input(s): LIPASE, AMYLASE in the last 168 hours. No results for input(s): AMMONIA in the last 168 hours. CBC:  Recent Labs Lab 09/16/16 1419 09/17/16 0610  WBC 6.5 7.0  HGB 11.2* 10.4*  HCT 34.8* 32.5*  MCV 89.2 91.3  PLT 231 213   Cardiac Enzymes:  Recent Labs Lab 09/16/16 1656  TROPONINI 0.05*   CBG: No results for input(s): GLUCAP in the last 168 hours.  Iron Studies: No results for input(s): IRON, TIBC, TRANSFERRIN, FERRITIN in the last 72 hours. Studies/Results: Dg Chest Port 1 [address]  Result Date: 09/16/2016 CLINICAL DATA:  Preop for left hip fracture repair. EXAM: PORTABLE CHEST 1 VIEW COMPARISON:  Radiographs of June 16, 2016. FINDINGS: Stable cardiomegaly and central pulmonary vascular congestion. Atherosclerosis of thoracic aorta is noted. No pneumothorax or pleural effusion is noted. Stable elevated right hemidiaphragm is noted. No consolidative process is noted. Bony thorax is unremarkable. IMPRESSION: Stable cardiomegaly and central pulmonary vascular congestion. Aortic atherosclerosis. No consolidative process is noted. Electronically Signed   By: Marijo Conception, M.D.   On: 09/16/2016 14:17   Dg Hip Unilat W Or W/o Pelvis 2-3 Views Left  Result Date: 09/16/2016 CLINICAL DATA:  83 year old female with 6 weeks of left hip pain, no known injury. EXAM: DG HIP (WITH OR WITHOUT PELVIS) 2-3V LEFT COMPARISON:  PET-CT 07/26/2005 FINDINGS: Aortoiliac calcified atherosclerosis. Bilateral calcified femoral artery atherosclerosis. Osteopenia. The pelvis appears  intact. Grossly intact proximal right femur. The left femoral head is normally located. There is a left femoral neck fracture with mild varus angulation, no other displacement. The proximal left femur intertrochanteric segment appears to remain intact. Proximal left femoral shaft is intact. IMPRESSION: 1. Left femoral neck fracture with mild varus angulation appears acute versus subacute. 2. Osteopenia.  No other acute osseous abnormality identified. 3. Calcified aortic and iliofemoral atherosclerosis. Electronically Signed   By: Genevie Ann M.D.   On: 09/16/2016 12:50   Medications: Infusions: . sodium chloride 50 mL/hr at 09/17/16 0045  .  ceFAZolin (ANCEF) IV    . cefTRIAXone (ROCEPHIN)  IV    . [START ON 09/18/2016] ferric gluconate (FERRLECIT/NULECIT) IV Stopped (09/16/16 2335)  . tranexamic acid      Scheduled Medications: . amLODipine  5 mg Oral QHS  . aspirin EC  81 mg Oral Daily  . atorvastatin  80 mg Oral QHS  . calcitRIOL  0.25 mcg Oral Q T,Th,Sa-HD  . calcium carbonate  3,125 mg Oral TID WC  . heparin  5,000 Units Subcutaneous Q8H  . insulin aspart  0-9 Units Subcutaneous TID WC  . metoprolol tartrate  12.5 mg Oral BID  .  morphine injection  4 mg Intravenous Once  . multivitamin  1 tablet Oral QHS  . ondansetron (ZOFRAN) IV  4 mg Intravenous Once  . pantoprazole  40 mg Oral BID    have reviewed scheduled and prn medications.  Physical Exam: General: NAD Heart: RRR Lungs: mostly clear Abdomen: soft, non tender Extremities: no edema Dialysis Access: left AVF     09/17/2016,10:52 AM  LOS: 1 day

## 2016-09-18 ENCOUNTER — Inpatient Hospital Stay (HOSPITAL_COMMUNITY): Payer: Medicare Other | Admitting: Certified Registered Nurse Anesthetist

## 2016-09-18 ENCOUNTER — Encounter (HOSPITAL_COMMUNITY)
Admission: EM | Disposition: A | Discharge status: Skilled Nursing Facility | Payer: Self-pay | Source: Home / Self Care | Attending: Internal Medicine

## 2016-09-18 ENCOUNTER — Inpatient Hospital Stay (HOSPITAL_COMMUNITY): Payer: Medicare Other

## 2016-09-18 DIAGNOSIS — M12552 Traumatic arthropathy, left hip: Secondary | ICD-10-CM

## 2016-09-18 DIAGNOSIS — S72002A Fracture of unspecified part of neck of left femur, initial encounter for closed fracture: Secondary | ICD-10-CM

## 2016-09-18 DIAGNOSIS — I5022 Chronic systolic (congestive) heart failure: Secondary | ICD-10-CM

## 2016-09-18 HISTORY — PX: ANTERIOR APPROACH HEMI HIP ARTHROPLASTY: SHX6690

## 2016-09-18 LAB — CBC
HCT: 26.4 % — ABNORMAL LOW (ref 36.0–46.0)
Hemoglobin: 8.4 g/dL — ABNORMAL LOW (ref 12.0–15.0)
MCH: 28.4 pg (ref 26.0–34.0)
MCHC: 31.8 g/dL (ref 30.0–36.0)
MCV: 89.2 fL (ref 78.0–100.0)
PLATELETS: 182 10*3/uL (ref 150–400)
RBC: 2.96 MIL/uL — ABNORMAL LOW (ref 3.87–5.11)
RDW: 19.5 % — AB (ref 11.5–15.5)
WBC: 8 10*3/uL (ref 4.0–10.5)

## 2016-09-18 LAB — RENAL FUNCTION PANEL
ALBUMIN: 2.6 g/dL — AB (ref 3.5–5.0)
Anion gap: 14 (ref 5–15)
BUN: 53 mg/dL — AB (ref 6–20)
CALCIUM: 7.6 mg/dL — AB (ref 8.9–10.3)
CO2: 22 mmol/L (ref 22–32)
CREATININE: 6.68 mg/dL — AB (ref 0.44–1.00)
Chloride: 101 mmol/L (ref 101–111)
GFR calc Af Amer: 6 mL/min — ABNORMAL LOW (ref >60)
GFR, EST NON AFRICAN AMERICAN: 5 mL/min — AB (ref >60)
Glucose, Bld: 117 mg/dL — ABNORMAL HIGH (ref 65–99)
PHOSPHORUS: 6.8 mg/dL — AB (ref 2.5–4.6)
POTASSIUM: 4.5 mmol/L (ref 3.5–5.1)
SODIUM: 137 mmol/L (ref 135–145)

## 2016-09-18 LAB — GLUCOSE, CAPILLARY
GLUCOSE-CAPILLARY: 98 mg/dL (ref 65–99)
GLUCOSE-CAPILLARY: 80 mg/dL (ref 65–99)

## 2016-09-18 LAB — PREPARE RBC (CROSSMATCH)

## 2016-09-18 SURGERY — HEMIARTHROPLASTY, HIP, DIRECT ANTERIOR APPROACH, FOR FRACTURE
Anesthesia: Spinal | Site: Hip | Laterality: Left

## 2016-09-18 MED ORDER — FENTANYL CITRATE (PF) 100 MCG/2ML IJ SOLN
INTRAMUSCULAR | Status: DC | PRN
  Administered 2016-09-18: 25 ug via INTRAVENOUS

## 2016-09-18 MED ORDER — PHENYLEPHRINE 40 MCG/ML (10ML) SYRINGE FOR IV PUSH (FOR BLOOD PRESSURE SUPPORT)
PREFILLED_SYRINGE | INTRAVENOUS | Status: DC | PRN
  Administered 2016-09-18: 40 ug via INTRAVENOUS

## 2016-09-18 MED ORDER — ONDANSETRON HCL 4 MG PO TABS
4.0000 mg | ORAL_TABLET | Freq: Four times a day (QID) | ORAL | Status: DC | PRN
Start: 2016-09-18 — End: 2016-09-21

## 2016-09-18 MED ORDER — ONDANSETRON HCL 4 MG/2ML IJ SOLN
INTRAMUSCULAR | Status: AC
  Filled 2016-09-18: qty 2

## 2016-09-18 MED ORDER — FENTANYL CITRATE (PF) 250 MCG/5ML IJ SOLN
INTRAMUSCULAR | Status: AC
  Filled 2016-09-18: qty 5

## 2016-09-18 MED ORDER — MORPHINE SULFATE (PF) 4 MG/ML IV SOLN
0.5000 mg | INTRAVENOUS | Status: DC | PRN
  Administered 2016-09-19: 0.52 mg via INTRAVENOUS
  Filled 2016-09-18: qty 1

## 2016-09-18 MED ORDER — FENTANYL CITRATE (PF) 100 MCG/2ML IJ SOLN: 25.0000 ug | INTRAMUSCULAR | Status: DC | PRN

## 2016-09-18 MED ORDER — CEFAZOLIN SODIUM-DEXTROSE 2-4 GM/100ML-% IV SOLN
2.0000 g | Freq: Two times a day (BID) | INTRAVENOUS | Status: AC
  Administered 2016-09-18 – 2016-09-19 (×2): 2 g via INTRAVENOUS
  Filled 2016-09-18 (×2): qty 100

## 2016-09-18 MED ORDER — LACTATED RINGERS IV SOLN: INTRAVENOUS | Status: DC | PRN

## 2016-09-18 MED ORDER — SODIUM CHLORIDE 0.9 % IV SOLN
INTRAVENOUS | Status: DC
  Administered 2016-09-18: 12:00:00 via INTRAVENOUS

## 2016-09-18 MED ORDER — ALBUMIN HUMAN 25 % IV SOLN
12.5000 g | Freq: Once | INTRAVENOUS | Status: AC
  Administered 2016-09-18: 12.5 g via INTRAVENOUS

## 2016-09-18 MED ORDER — SUCCINYLCHOLINE CHLORIDE 200 MG/10ML IV SOSY
PREFILLED_SYRINGE | INTRAVENOUS | Status: AC
  Filled 2016-09-18: qty 10

## 2016-09-18 MED ORDER — METOCLOPRAMIDE HCL 5 MG/ML IJ SOLN: 5.0000 mg | Freq: Three times a day (TID) | INTRAMUSCULAR | Status: DC | PRN

## 2016-09-18 MED ORDER — 0.9 % SODIUM CHLORIDE (POUR BTL) OPTIME
TOPICAL | Status: DC | PRN
  Administered 2016-09-18: 1000 mL

## 2016-09-18 MED ORDER — DEXAMETHASONE SODIUM PHOSPHATE 10 MG/ML IJ SOLN
INTRAMUSCULAR | Status: AC
  Filled 2016-09-18: qty 1

## 2016-09-18 MED ORDER — MIDODRINE HCL 5 MG PO TABS
5.0000 mg | ORAL_TABLET | Freq: Once | ORAL | Status: AC
  Administered 2016-09-18: 5 mg via ORAL
  Filled 2016-09-18 (×2): qty 1

## 2016-09-18 MED ORDER — ALBUMIN HUMAN 25 % IV SOLN
INTRAVENOUS | Status: AC
  Administered 2016-09-18: 12.5 g via INTRAVENOUS
  Filled 2016-09-18: qty 50

## 2016-09-18 MED ORDER — METOCLOPRAMIDE HCL 5 MG PO TABS
5.0000 mg | ORAL_TABLET | Freq: Three times a day (TID) | ORAL | Status: DC | PRN
Start: 2016-09-18 — End: 2016-09-21

## 2016-09-18 MED ORDER — HYDROCODONE-ACETAMINOPHEN 5-325 MG PO TABS
1.0000 | ORAL_TABLET | Freq: Four times a day (QID) | ORAL | Status: DC | PRN
  Administered 2016-09-18 – 2016-09-19 (×3): 2 via ORAL
  Administered 2016-09-20: 1 via ORAL
  Filled 2016-09-18 (×2): qty 2
  Filled 2016-09-18: qty 1
  Filled 2016-09-18: qty 2

## 2016-09-18 MED ORDER — PHENYLEPHRINE 40 MCG/ML (10ML) SYRINGE FOR IV PUSH (FOR BLOOD PRESSURE SUPPORT)
PREFILLED_SYRINGE | INTRAVENOUS | Status: AC
  Filled 2016-09-18: qty 10

## 2016-09-18 MED ORDER — PHENYLEPHRINE HCL 10 MG/ML IJ SOLN
INTRAVENOUS | Status: DC | PRN
  Administered 2016-09-18: 20 ug/min via INTRAVENOUS

## 2016-09-18 MED ORDER — PROPOFOL 10 MG/ML IV BOLUS
INTRAVENOUS | Status: DC | PRN
Start: 2016-09-18 — End: 2016-09-18
  Administered 2016-09-18: 20 mg via INTRAVENOUS

## 2016-09-18 MED ORDER — SODIUM CHLORIDE 0.9 % IV SOLN: Freq: Once | INTRAVENOUS | Status: DC

## 2016-09-18 MED ORDER — NEOSTIGMINE METHYLSULFATE 5 MG/5ML IV SOSY
PREFILLED_SYRINGE | INTRAVENOUS | Status: AC
  Filled 2016-09-18: qty 5

## 2016-09-18 MED ORDER — EPHEDRINE 5 MG/ML INJ
INTRAVENOUS | Status: AC
  Filled 2016-09-18: qty 10

## 2016-09-18 MED ORDER — ROCURONIUM BROMIDE 10 MG/ML (PF) SYRINGE
PREFILLED_SYRINGE | INTRAVENOUS | Status: AC
  Filled 2016-09-18: qty 5

## 2016-09-18 MED ORDER — ALBUMIN HUMAN 25 % IV SOLN: 12.5000 g | Freq: Once | INTRAVENOUS | Status: DC

## 2016-09-18 MED ORDER — ALBUMIN HUMAN 5 % IV SOLN
INTRAVENOUS | Status: DC | PRN
  Administered 2016-09-18: 09:00:00 via INTRAVENOUS

## 2016-09-18 MED ORDER — ASPIRIN EC 325 MG PO TBEC
325.0000 mg | DELAYED_RELEASE_TABLET | Freq: Every day | ORAL | Status: DC
  Administered 2016-09-19 – 2016-09-21 (×3): 325 mg via ORAL
  Filled 2016-09-18 (×3): qty 1

## 2016-09-18 MED ORDER — ACETAMINOPHEN 325 MG PO TABS
650.0000 mg | ORAL_TABLET | Freq: Four times a day (QID) | ORAL | Status: DC | PRN
  Administered 2016-09-19: 650 mg via ORAL
  Filled 2016-09-18: qty 2

## 2016-09-18 MED ORDER — ACETAMINOPHEN 650 MG RE SUPP: 650.0000 mg | Freq: Four times a day (QID) | RECTAL | Status: DC | PRN

## 2016-09-18 MED ORDER — BUPIVACAINE IN DEXTROSE 0.75-8.25 % IT SOLN
INTRATHECAL | Status: DC | PRN
  Administered 2016-09-18: 12 mg via INTRATHECAL

## 2016-09-18 MED ORDER — SUGAMMADEX SODIUM 200 MG/2ML IV SOLN
INTRAVENOUS | Status: AC
  Filled 2016-09-18: qty 2

## 2016-09-18 MED ORDER — ONDANSETRON HCL 4 MG/2ML IJ SOLN
4.0000 mg | Freq: Four times a day (QID) | INTRAMUSCULAR | Status: DC | PRN
  Administered 2016-09-18: 4 mg via INTRAVENOUS
  Filled 2016-09-18: qty 2

## 2016-09-18 MED ORDER — PHENOL 1.4 % MT LIQD: 1.0000 | OROMUCOSAL | Status: DC | PRN

## 2016-09-18 MED ORDER — PROPOFOL 500 MG/50ML IV EMUL
INTRAVENOUS | Status: DC | PRN
  Administered 2016-09-18: 40 ug/kg/min via INTRAVENOUS

## 2016-09-18 MED ORDER — LIDOCAINE 2% (20 MG/ML) 5 ML SYRINGE
INTRAMUSCULAR | Status: AC
Start: 2016-09-18 — End: 2016-09-18
  Filled 2016-09-18: qty 5

## 2016-09-18 MED ORDER — MENTHOL 3 MG MT LOZG: 1.0000 | LOZENGE | OROMUCOSAL | Status: DC | PRN

## 2016-09-18 MED ORDER — PROPOFOL 500 MG/50ML IV EMUL: INTRAVENOUS | Status: DC | PRN

## 2016-09-18 MED ORDER — SODIUM CHLORIDE 0.9 % IR SOLN
Status: DC | PRN
  Administered 2016-09-18: 3000 mL

## 2016-09-18 MED ORDER — PROPOFOL 1000 MG/100ML IV EMUL
INTRAVENOUS | Status: AC
  Filled 2016-09-18: qty 100

## 2016-09-18 MED ORDER — SODIUM CHLORIDE 0.9 % IV SOLN
INTRAVENOUS | Status: DC | PRN
  Administered 2016-09-18: 07:00:00 via INTRAVENOUS

## 2016-09-18 MED ORDER — PROPOFOL 10 MG/ML IV BOLUS
INTRAVENOUS | Status: AC
  Filled 2016-09-18: qty 20

## 2016-09-18 SURGICAL SUPPLY — 58 items
APL SKNCLS STERI-STRIP NONHPOA (GAUZE/BANDAGES/DRESSINGS) ×1
BENZOIN TINCTURE PRP APPL 2/3 (GAUZE/BANDAGES/DRESSINGS) ×3 IMPLANT
BLADE CLIPPER SURG (BLADE) IMPLANT
BLADE SAW SGTL 18X1.27X75 (BLADE) ×2 IMPLANT
BLADE SAW SGTL 18X1.27X75MM (BLADE) ×1
CABLE ASSY CERCLAGE SST 1.8X55 (Orthopedic Implant) ×2 IMPLANT
CAPT HIP TOTAL 2 ×2 IMPLANT
CELLS DAT CNTRL 66122 CELL SVR (MISCELLANEOUS) ×1 IMPLANT
CLOSURE WOUND 1/2 X4 (GAUZE/BANDAGES/DRESSINGS) ×2
COVER SURGICAL LIGHT HANDLE (MISCELLANEOUS) ×3 IMPLANT
DRAPE C-ARM 42X72 X-RAY (DRAPES) ×3 IMPLANT
DRAPE STERI IOBAN 125X83 (DRAPES) ×3 IMPLANT
DRAPE U-SHAPE 47X51 STRL (DRAPES) ×9 IMPLANT
DRESSING AQUACEL AG SP 3.5X6 (GAUZE/BANDAGES/DRESSINGS) IMPLANT
DRSG AQUACEL AG ADV 3.5X10 (GAUZE/BANDAGES/DRESSINGS) ×3 IMPLANT
DRSG AQUACEL AG SP 3.5X6 (GAUZE/BANDAGES/DRESSINGS) ×3
DURAPREP 26ML APPLICATOR (WOUND CARE) ×3 IMPLANT
ELECT BLADE 4.0 EZ CLEAN MEGAD (MISCELLANEOUS) ×3
ELECT BLADE 6.5 EXT (BLADE) IMPLANT
ELECT REM PT RETURN 9FT ADLT (ELECTROSURGICAL) ×3
ELECTRODE BLDE 4.0 EZ CLN MEGD (MISCELLANEOUS) ×1 IMPLANT
ELECTRODE REM PT RTRN 9FT ADLT (ELECTROSURGICAL) ×1 IMPLANT
FACESHIELD WRAPAROUND (MASK) ×6 IMPLANT
FACESHIELD WRAPAROUND OR TEAM (MASK) ×2 IMPLANT
GAUZE XEROFORM 1X8 LF (GAUZE/BANDAGES/DRESSINGS) ×2 IMPLANT
GLOVE BIOGEL PI IND STRL 8 (GLOVE) ×2 IMPLANT
GLOVE BIOGEL PI INDICATOR 8 (GLOVE) ×4
GLOVE ECLIPSE 8.0 STRL XLNG CF (GLOVE) ×3 IMPLANT
GLOVE ORTHO TXT STRL SZ7.5 (GLOVE) ×6 IMPLANT
GOWN STRL REUS W/ TWL LRG LVL3 (GOWN DISPOSABLE) ×2 IMPLANT
GOWN STRL REUS W/ TWL XL LVL3 (GOWN DISPOSABLE) ×2 IMPLANT
GOWN STRL REUS W/TWL LRG LVL3 (GOWN DISPOSABLE) ×6
GOWN STRL REUS W/TWL XL LVL3 (GOWN DISPOSABLE) ×6
HANDPIECE INTERPULSE COAX TIP (DISPOSABLE) ×3
KIT BASIN OR (CUSTOM PROCEDURE TRAY) ×3 IMPLANT
KIT ROOM TURNOVER OR (KITS) ×3 IMPLANT
MANIFOLD NEPTUNE II (INSTRUMENTS) ×3 IMPLANT
NS IRRIG 1000ML POUR BTL (IV SOLUTION) ×3 IMPLANT
PACK TOTAL JOINT (CUSTOM PROCEDURE TRAY) ×3 IMPLANT
PAD ARMBOARD 7.5X6 YLW CONV (MISCELLANEOUS) ×3 IMPLANT
RETRACTOR WND ALEXIS 18 MED (MISCELLANEOUS) ×1 IMPLANT
RTRCTR WOUND ALEXIS 18CM MED (MISCELLANEOUS) ×3
SET HNDPC FAN SPRY TIP SCT (DISPOSABLE) ×1 IMPLANT
STAPLER VISISTAT 35W (STAPLE) IMPLANT
STRIP CLOSURE SKIN 1/2X4 (GAUZE/BANDAGES/DRESSINGS) ×4 IMPLANT
SUT ETHIBOND NAB CT1 #1 30IN (SUTURE) ×3 IMPLANT
SUT MNCRL AB 4-0 PS2 18 (SUTURE) IMPLANT
SUT VIC AB 0 CT1 27 (SUTURE) ×3
SUT VIC AB 0 CT1 27XBRD ANBCTR (SUTURE) ×1 IMPLANT
SUT VIC AB 1 CT1 27 (SUTURE) ×3
SUT VIC AB 1 CT1 27XBRD ANBCTR (SUTURE) ×1 IMPLANT
SUT VIC AB 2-0 CT1 27 (SUTURE) ×3
SUT VIC AB 2-0 CT1 TAPERPNT 27 (SUTURE) ×1 IMPLANT
TOWEL OR 17X24 6PK STRL BLUE (TOWEL DISPOSABLE) ×3 IMPLANT
TOWEL OR 17X26 10 PK STRL BLUE (TOWEL DISPOSABLE) ×3 IMPLANT
TRAY CATH 16FR W/PLASTIC CATH (SET/KITS/TRAYS/PACK) IMPLANT
TRAY FOLEY W/METER SILVER 16FR (SET/KITS/TRAYS/PACK) IMPLANT
WATER STERILE IRR 1000ML POUR (IV SOLUTION) ×6 IMPLANT

## 2016-09-18 NOTE — Progress Notes (Signed)
[facility] PROGRESS NOTE  [name]:[id number] DOB: 1932/02/02 DOA: 09/16/2016   PCP: [name], MD     Assessment/Plan: Active Problems:   Hyperlipidemia   Hypertension   Chronic Facial droop   ESRD on dialysis ([place])   GERD (gastroesophageal reflux disease)   Mood disorder (HCC)   Systolic murmur   Imbalance   GAVE (gastric antral vascular ectasia)   Exertional dyspnea   CAD-severe multivessel, not CABG candidate   Forehead laceration   Chronic respiratory failure with hypoxia, on home O2 therapy (HCC)   Iron deficiency anemia due to chronic blood loss   S/p left hip fracture   Hyperglycemia   Hip fracture (HCC)   Closed fracture of neck of left femur (HCC)   Congestive heart failure (CHF) ([place])    83 y.o. female with  Extensive medical history   CAD that is medically managed, ESRD on HD ([name]), HTN, HLD, pulmonary hypertension, and anemia. She presented to [facility] with left hip pain and was found to have a left femoral neck fracture. Cardiology consult obtained for preop clearance  Assessment and plan Left fem neck Fracture    status post hemiarthroplasty of the left, by Dr. Erlinda Hong. 6/23   Telemetry uneventful, Cardiology consulted for preop clearance and deemed to be high risk for her age and comorbidities, severe three-vessel coronary artery disease, not a candidate for PCI or CABG PT/OT after surgery    ESRD on HD    Tuesday Thursday Saturday  Primary Nephrologist Dr. Blanchard Mane Nephrology following     Multivessel CAD    last cardiac catheterization 03/2015 on medical RX . Felt not to be a candidate for PCI or bypass surgery Continue ASA,  after surgery  high dose statin, beta blockers most recent 2-D echo 07/15/16, EF 65-70%, shows pulmonary hypertension  Hypertension  Controlled Continue home anti-hypertensive medications after HD     Hyperlipidemia Continue home statins  GERD, no acute symptoms Continue PPI  Anemia of  chronic disease Hemoglobin on admission  11.2 at baseline  Hemoglobin down to 10.4, continue to follow serially Receives Aranesp as OP    Hyperglycemia, patient denies H/O DM, current glu 119  Hemoglobin A1c 6.0, continue Accu-Cheks, continue sliding scale insulin  Chronic respiratory failure with hypoxia, wears 2 L home O2 prn, no diagnosis of COPD. NO Tobacco., NAD. CXR without infiltrate  O2 in the low 90s in RA, continue oxygen via [place],    UTI Patient initiated on Rocephin, urine culture pending    DVT prophylaxsis heparin  Code Status:  Full code   Family Communication: Discussed in detail with the patient, all imaging results, lab results explained to the patient   Disposition Plan:  Likely needs SNF       Consultants:  Orthopedics  Nephrology  Procedures:  None  Antibiotics: Anti-infectives    Start     Dose/Rate Route Frequency Ordered Stop   09/17/16 1000  ceFAZolin (ANCEF) IVPB 2g/100 mL premix     2 g 200 mL/hr over 30 Minutes Intravenous To [facility] 09/17/16 0955 09/18/16 0735   09/17/16 1000  [MAR Hold]  cefTRIAXone (ROCEPHIN) 1 g in dextrose 5 % 50 mL IVPB     (MAR Hold since 09/18/16 0636)   1 g 100 mL/hr over 30 Minutes Intravenous Every 24 hours 09/17/16 0945           HPI/Subjective: Denies chest pain or shortness of breath,  Left hip pain stable  Objective: Vitals:   09/17/16 0545 09/17/16 1700 09/17/16 2149 09/18/16 0352  BP: (!) 157/60 (!) 155/60 (!) 109/53 (!) 153/56  Pulse: 76 73 62 72  Resp: 18 18 18 18   Temp: 98.1 F (36.7 C) 98.2 F (36.8 C) 98.1 F (36.7 C) 98.2 F (36.8 C)  TempSrc: Oral Oral Oral Oral  SpO2: 99% 99%    Weight:      Height:        Intake/Output Summary (Last 24 hours) at 09/18/16 0813 Last data filed at 09/17/16 1249  Gross per 24 hour  Intake               50 ml  Output                0 ml  Net               50 ml    Exam:  Examination:  General exam: Appears calm and  comfortable  Respiratory system: Clear to auscultation. Respiratory effort normal. Cardiovascular system: S1 & S2 heard, RRR. No JVD, murmurs, rubs, gallops or clicks. No pedal edema. Gastrointestinal system: Abdomen is nondistended, soft and nontender. No organomegaly or masses felt. Normal bowel sounds heard. Central nervous system: Alert and oriented. No focal neurological deficits. Extremities: Symmetric 5 x 5 power. Skin: No rashes, lesions or ulcers Psychiatry: Judgement and insight appear normal. Mood & affect appropriate.     Data Reviewed: I have personally reviewed following labs and imaging studies  Micro Results Recent Results (from the past 240 hour(s))  Surgical pcr screen     Status: None   Collection Time: 09/17/16  2:04 AM  Result Value Ref Range Status   MRSA, PCR NEGATIVE NEGATIVE Final   Staphylococcus aureus NEGATIVE NEGATIVE Final    Comment:        The Xpert SA Assay (FDA approved for NASAL specimens in patients over 71 years of age), is one component of a comprehensive surveillance program.  Test performance has been validated by [facility] for patients greater than or equal to 6 year old. It is not intended to diagnose infection nor to guide or monitor treatment.     Radiology Reports Dg Chest Port 1 View  Result Date: 09/16/2016 CLINICAL DATA:  Preop for left hip fracture repair. EXAM: PORTABLE CHEST 1 VIEW COMPARISON:  Radiographs of June 16, 2016. FINDINGS: Stable cardiomegaly and central pulmonary vascular congestion. Atherosclerosis of thoracic aorta is noted. No pneumothorax or pleural effusion is noted. Stable elevated right hemidiaphragm is noted. No consolidative process is noted. Bony thorax is unremarkable. IMPRESSION: Stable cardiomegaly and central pulmonary vascular congestion. Aortic atherosclerosis. No consolidative process is noted. Electronically Signed   By: Marijo Conception, M.D.   On: 09/16/2016 14:17   Dg Hip Unilat W Or W/o  Pelvis 2-3 Views Left  Result Date: 09/16/2016 CLINICAL DATA:  83 year old female with 6 weeks of left hip pain, no known injury. EXAM: DG HIP (WITH OR WITHOUT PELVIS) 2-3V LEFT COMPARISON:  PET-CT 07/26/2005 FINDINGS: Aortoiliac calcified atherosclerosis. Bilateral calcified femoral artery atherosclerosis. Osteopenia. The pelvis appears intact. Grossly intact proximal right femur. The left femoral head is normally located. There is a left femoral neck fracture with mild varus angulation, no other displacement. The proximal left femur intertrochanteric segment appears to remain intact. Proximal left femoral shaft is intact. IMPRESSION: 1. Left femoral neck fracture with mild varus angulation appears acute versus subacute. 2. Osteopenia.  No other acute osseous abnormality identified. 3. Calcified  aortic and iliofemoral atherosclerosis. Electronically Signed   By: Genevie Ann M.D.   On: 09/16/2016 12:50     CBC  Recent Labs Lab 09/16/16 1419 09/17/16 0610 09/17/16 2214  WBC 6.5 7.0 6.4  HGB 11.2* 10.4* 10.4*  HCT 34.8* 32.5* 33.2*  PLT 231 213 229  MCV 89.2 91.3 91.5  MCH 28.7 29.2 28.7  MCHC 32.2 32.0 31.3  RDW 18.9* 19.3* 19.5*    Chemistries   Recent Labs Lab 09/16/16 1419 09/17/16 0610 09/17/16 2214  NA 139 139 138  K 4.3 4.1 4.1  CL 96* 98* 98*  CO2 25 29 29   GLUCOSE 119* 108* 110*  BUN 58* 25* 36*  CREATININE 7.07* 4.19* 5.68*  CALCIUM 8.9 8.3* 8.3*   ------------------------------------------------------------------------------------------------------------------ estimated creatinine clearance is 6 mL/min (A) (by C-G formula based on SCr of 5.68 mg/dL (H)). ------------------------------------------------------------------------------------------------------------------  Recent Labs  09/16/16 1700  HGBA1C 6.0*   ------------------------------------------------------------------------------------------------------------------ No results for input(s): CHOL, HDL,  LDLCALC, TRIG, CHOLHDL, LDLDIRECT in the last 72 hours. ------------------------------------------------------------------------------------------------------------------ No results for input(s): TSH, T4TOTAL, T3FREE, THYROIDAB in the last 72 hours.  Invalid input(s): FREET3 ------------------------------------------------------------------------------------------------------------------ No results for input(s): VITAMINB12, FOLATE, FERRITIN, TIBC, IRON, RETICCTPCT in the last 72 hours.  Coagulation profile  Recent Labs Lab 09/16/16 1419 09/17/16 0610  INR 1.03 1.07    No results for input(s): DDIMER in the last 72 hours.  Cardiac Enzymes  Recent Labs Lab 09/16/16 1656  TROPONINI 0.05*   ------------------------------------------------------------------------------------------------------------------ Invalid input(s): POCBNP   CBG:  Recent Labs Lab 09/17/16 1141 09/17/16 1725 09/17/16 2157 09/18/16 0548  GLUCAP 78 73 121* 98       Studies: Dg Chest Port 1 View  Result Date: 09/16/2016 CLINICAL DATA:  Preop for left hip fracture repair. EXAM: PORTABLE CHEST 1 VIEW COMPARISON:  Radiographs of June 16, 2016. FINDINGS: Stable cardiomegaly and central pulmonary vascular congestion. Atherosclerosis of thoracic aorta is noted. No pneumothorax or pleural effusion is noted. Stable elevated right hemidiaphragm is noted. No consolidative process is noted. Bony thorax is unremarkable. IMPRESSION: Stable cardiomegaly and central pulmonary vascular congestion. Aortic atherosclerosis. No consolidative process is noted. Electronically Signed   By: Marijo Conception, M.D.   On: 09/16/2016 14:17   Dg Hip Unilat W Or W/o Pelvis 2-3 Views Left  Result Date: 09/16/2016 CLINICAL DATA:  83 year old female with 6 weeks of left hip pain, no known injury. EXAM: DG HIP (WITH OR WITHOUT PELVIS) 2-3V LEFT COMPARISON:  PET-CT 07/26/2005 FINDINGS: Aortoiliac calcified atherosclerosis. Bilateral  calcified femoral artery atherosclerosis. Osteopenia. The pelvis appears intact. Grossly intact proximal right femur. The left femoral head is normally located. There is a left femoral neck fracture with mild varus angulation, no other displacement. The proximal left femur intertrochanteric segment appears to remain intact. Proximal left femoral shaft is intact. IMPRESSION: 1. Left femoral neck fracture with mild varus angulation appears acute versus subacute. 2. Osteopenia.  No other acute osseous abnormality identified. 3. Calcified aortic and iliofemoral atherosclerosis. Electronically Signed   By: Genevie Ann M.D.   On: 09/16/2016 12:50      Lab Results  Component Value Date   HGBA1C 6.0 (H) 09/16/2016   HGBA1C 5.4 09/23/2014   HGBA1C 4.8 04/09/2014   Lab Results  Component Value Date   LDLCALC 42 06/19/2015   CREATININE 5.68 (H) 09/17/2016       Scheduled Meds: . [MAR Hold] amLODipine  5 mg Oral QHS  . [MAR Hold] aspirin EC  81 mg Oral Daily  . [  MAR Hold] atorvastatin  80 mg Oral QHS  . [MAR Hold] calcitRIOL  0.25 mcg Oral Q T,Th,Sa-HD  . [MAR Hold] calcium carbonate  3,125 mg Oral TID WC  . [MAR Hold] heparin  5,000 Units Subcutaneous Q8H  . [MAR Hold] insulin aspart  0-9 Units Subcutaneous TID WC  . [MAR Hold] metoprolol tartrate  12.5 mg Oral BID  . [MAR Hold]  morphine injection  4 mg Intravenous Once  . [MAR Hold] multivitamin  1 tablet Oral QHS  . [MAR Hold] ondansetron (ZOFRAN) IV  4 mg Intravenous Once  . [MAR Hold] pantoprazole  40 mg Oral BID   Continuous Infusions: . [MAR Hold] cefTRIAXone (ROCEPHIN)  IV Stopped (09/17/16 1249)  . [MAR Hold] ferric gluconate (FERRLECIT/NULECIT) IV Stopped (09/16/16 2335)  . tranexamic acid       LOS: 2 days    Time spent: >30 MINS    Reyne Dumas  [facility] Pager [phone]. If 7PM-7AM, please contact night-coverage at [email or web address], password [facility] 09/18/2016, 8:13 AM  LOS: 2 days

## 2016-09-18 NOTE — Anesthesia Postprocedure Evaluation (Signed)
Anesthesia Post Note  Patient: [name]  Procedure(s) Performed: Procedure(s) (LRB): ANTERIOR APPROACH HEMI HIP ARTHROPLASTY (Left)     Patient location during evaluation: PACU Anesthesia Type: Spinal Level of consciousness: awake and alert Pain management: pain level controlled Vital Signs Assessment: post-procedure vital signs reviewed and stable Respiratory status: spontaneous breathing and respiratory function stable Cardiovascular status: blood pressure returned to baseline and stable Postop Assessment: spinal receding Anesthetic complications: no    Last Vitals:  Vitals:   09/18/16 1001 09/18/16 1016  BP: (!) 148/64 (!) 145/62  Pulse: 67 63  Resp: 14 13  Temp: 36.2 C     Last Pain:  Vitals:   09/18/16 1001  TempSrc:   PainSc: 0-No pain                 Debie Ashline,W. EDMOND

## 2016-09-18 NOTE — Brief Op Note (Signed)
09/16/2016 - 09/18/2016  9:38 AM  PATIENT:  [name]  83 y.o. female  PRE-OPERATIVE DIAGNOSIS:  Left HIP FX  POST-OPERATIVE DIAGNOSIS:  Left HIP FX  PROCEDURE:  Left Direct Anterior Total Hip Arthroplasty  SURGEON:  Surgeon(s) and Role:    Mcarthur Rossetti, MD - Primary  PHYSICIAN ASSISTANT: Benita Stabile, PA-C  ANESTHESIA:   spinal  EBL:  Total I/O In: 250 [IV Piggyback:250] Out: 600 [Blood:600]  COUNTS:  YES  DICTATION: .Other Dictation: Dictation Number [id number]  PLAN OF CARE: Admit to inpatient   PATIENT DISPOSITION:  PACU - hemodynamically stable.   Delay start of Pharmacological VTE agent (>24hrs) due to surgical blood loss or risk of bleeding: no

## 2016-09-18 NOTE — Progress Notes (Signed)
PT Cancellation Note  Patient Details Name: [name] MRN: [id number] DOB: 05-06-1931   Cancelled Treatment:    Reason Eval/Treat Not Completed: Patient not medically ready, surgery today, orders for PT to begin tomorrow.    [place] 09/18/2016, 2:20 PM

## 2016-09-18 NOTE — Op Note (Signed)
[name], [name]                 ACCOUNT NO.:  [id number]  MEDICAL RECORD NO.:  [id number]  LOCATION:                                 FACILITY:  PHYSICIAN:  Lind Guest. Ninfa Linden, M.D.DATE OF BIRTH:  1932/01/08  DATE OF PROCEDURE:  09/18/2016 DATE OF DISCHARGE:                              OPERATIVE REPORT   POSTOPERATIVE DIAGNOSIS:  Left hip displaced femoral neck fracture.  POSTOPERATIVE DIAGNOSIS:  Left hip displaced femoral neck fracture.  PROCEDURE:  Left total hip arthroplasty through direct anterior approach.  IMPLANTS:  [employer] Gription acetabular component size 50, size 32 +4 polyethylene liner, size 16 Corail femoral component with varus offset, size 32 +1 metal hip ball.  SURGEON:  Lind Guest. Ninfa Linden, M.D.  ASSISTANT:  Erskine Emery, PA-C.  ANESTHESIA:  Spinal.  BLOOD LOSS:  600 mL.  FLUIDS GIVEN:  1 unit of packed red blood cells.  COMPLICATIONS:  Proximal femur calcar fracture due to soft bone.  DISPOSITION:  To PACU in stable condition.  INDICATIONS:  [name] is an 83 year old, frail female, who sustained a mechanical fall injuring her left hip.  She was found to have a displaced basicervical femoral neck fracture, which was quite a low fracture, but due to displacement of the fracture, it was felt that at least hemiarthroplasty would be better than attempting a rod and hip screw that has a high likelihood of not healing and giving her osteopenic bone and the fact that she has renal osteodystrophy.  She is on dialysis as well.  We talked about proceeding to the OR with a hemiarthroplasty, and the risks and benefits of this were explained to her in detail, and she did wish to proceed.  She had no pre-existing arthritis and has low demand.  PROCEDURE DESCRIPTION:  After informed consent was obtained, appropriate left hip was marked.  She was brought to the operating room, and while she was on her stretcher, spinal anesthesia was obtained.   Both feet were placed in traction boots and then she was placed supine on the Hana fracture table with the perineal post in place and both legs in InLine skeletal traction, but no traction applied.  Her left hip was prepped and draped with DuraPrep and sterile drapes.  Time-out was called, and she was identified as correct patient, and correct left hip.  I then made an incision just inferior and posterior to the anterior superior iliac spine and carried this obliquely down to the leg.  We dissected down to tensor fascia lata muscle and tensor fascia was then divided longitudinally to proceed with a direct anterior approach to the hip. We identified and cauterized the circumflex vessels and then identified the hip capsule.  I opened up the hip capsule, finding a very large hematoma.  We irrigated this out and suctioned it out as well.  We then placed Cobra retractors on the medial and lateral femoral neck.  It was a very low neck fracture that was displaced.  We freshened the cut-up with an oscillating saw right at the fracture site.  We removed the femoral head in its entirety and took a measurement of this and chose a size  44 bipolar hip ball.  We cleaned the acetabular debris, but limited to the labrum, and of note she had no significant arthritic changes, but definitely thin bone.  We then brought the leg down and under to proceed with a broaching aspect of the case.  Her bone was very thin and osteopenic and once we started our passing broaches and noted propagation of fracture of the calcar region.  With that being said, I felt it would be better to convert her to a total hip replacement, that would make reduction into the acetabulum easier and sit up with a large hip ball.  We then went back to the acetabular side and removed remnants of acetabular labrum and then began reaming and was able to place a size 50 acetabular component.  This was medialized. Once I get the 50 component  in place, I placed a 32 +4 polyethylene liner to increase her offset.  We then broached all the way up to a size 16 component and placed a varus offset femoral neck off this and 32 +1 hip ball and reduced in the pelvis, and it was stable. We did sustain a small avulsion of the tip of greater trochanter and we did dislocate the hip and removed the trial components.  I then placed the real size 16 femoral component with varus offset and then placed a single Zimmer cable around the calcar region and we tightened this and felt that we had good stability of the fracture.  We then brought the leg back over and up with the real Corail size 16 femoral component with varus offset and the cable in place, we placed a real 32 +1 metal hip ball and reduced this in the pelvis.  We were pleased with the range of motion and stability on stressing the hip.  We verified this positioning all under direct fluoroscopy as well.  I then irrigated the soft tissue with normal saline solution using pulsatile lavage.  There was essentially really no joint capsule to close.  We closed the iliotibial band with a running #1 Vicryl suture, followed by 0 Vicryl in the deep tissue, 2-0 Vicryl in the subcutaneous tissue, interrupted staples on the skin, and an Aquacel dressing was applied.  She was taken to recovery room off the [facility] table in stable condition with only the calcar fracture noted.     Lind Guest. Ninfa Linden, M.D.   ______________________________ Lind Guest. Ninfa Linden, M.D.    CYB/MEDQ  D:  09/18/2016  T:  09/18/2016  Job:  [id number]

## 2016-09-18 NOTE — Progress Notes (Signed)
OT Cancellation Note  Patient Details Name: [name] MRN: [id number] DOB: 01-Sep-1931   Cancelled Treatment:    Reason Eval/Treat Not Completed: Patient at procedure or test/ unavailable. Pt currently in sx will attempt eval at later time.  Almon Register [id number] 09/18/2016, 7:19 AM

## 2016-09-18 NOTE — Progress Notes (Signed)
Subjective:  S/p L hip arthroplasty yesterday.  Feeling worn out.    Objective Vital signs in last 24 hours: Vitals:   09/18/16 1001 09/18/16 1016 09/18/16 1027 09/18/16 1500  BP: (!) 148/64 (!) 145/62  (!) 170/40  Pulse: 67 63  72  Resp: 14 13  18   Temp: 97.2 F (36.2 C)  97.2 F (36.2 C) 97.5 F (36.4 C)  TempSrc:    Oral  SpO2: 100% 99%  97%  Weight:      Height:       Weight change:   Intake/Output Summary (Last 24 hours) at 09/18/16 1746 Last data filed at 09/18/16 0936  Gross per 24 hour  Intake              585 ml  Output              600 ml  Net              -15 ml    Dialysis Orders: [facility], T,Th,S 3 hrs. 160 NRe 350/600 49.5 kg 2.0 K/2.0 Ca  Linear Na/UF Profile 4 -No Heparin -Mircera 50 mcg IV Q 4 weeks (last dose 09/11/16 HGB 10.5 09/14/16 ) -Venofer 100 mg IV X 10 doses (has not been started yet-Fe 71 Tsat 27% Ferritin 169 09/08/16) -Calcitriol 0.25 mcg PO TIW (Last PTH 197 09/08/16)  BMD meds: TUMS 3000 mg PO TID AC (Last Ca 9.4 C Ca 9.6 phos 4.2 09/08/16)  Assessment/Plan: 1. L Hip Fracture: Orthopedic consult. S/p L hip arthroplasty 6/22.  Denies significant pain at present 2.  ESRD -  T,TH,S via LUA AVF. K+ 4.1 .plan for HD today on regular schedule. 3.  Hypertension/volume  - Metoprolol 12.5 mg PO BID, Amlodipine 10 mg PO Q HS on OP med list. Vascular congestion noted on CXR, does not sound or look wet. Will challenge 0.5 and lower EDW slightly as tolerated.  4.  Anemia  - HGB 11.2- down to 10.4, anticipate will go lower after surgery. Recent OP ESA dose. Follow HGB. Current order for Fe load appears excessive for Tsat 27%-change to venofer 100 mg IV X 3 doses.  5.  Metabolic bone disease -  Cont binders, VDRA. 6.  Nutrition - Renal diet, renal vit/nepro 7. H/O multi-vessel CAD: No chest pain reported. Was considered not a candidate for CABG in past  8. H/O GI bleed: HGB stable, no recent issues.   [name]    Labs: Basic Metabolic Panel:  Recent Labs Lab 09/16/16 1419 09/17/16 0610 09/17/16 2214  NA 139 139 138  K 4.3 4.1 4.1  CL 96* 98* 98*  CO2 25 29 29   GLUCOSE 119* 108* 110*  BUN 58* 25* 36*  CREATININE 7.07* 4.19* 5.68*  CALCIUM 8.9 8.3* 8.3*  PHOS  --   --  4.8*   Liver Function Tests:  Recent Labs Lab 09/17/16 2214  ALBUMIN 2.8*   No results for input(s): LIPASE, AMYLASE in the last 168 hours. No results for input(s): AMMONIA in the last 168 hours. CBC:  Recent Labs Lab 09/16/16 1419 09/17/16 0610 09/17/16 2214  WBC 6.5 7.0 6.4  HGB 11.2* 10.4* 10.4*  HCT 34.8* 32.5* 33.2*  MCV 89.2 91.3 91.5  PLT 231 213 229   Cardiac Enzymes:  Recent Labs Lab 09/16/16 1656  TROPONINI 0.05*   CBG:  Recent Labs Lab 09/17/16 1141 09/17/16 1725 09/17/16 2157 09/18/16 0548  GLUCAP 78 73 121* 98    Iron Studies:  No results for input(s): IRON, TIBC, TRANSFERRIN, FERRITIN in the last 72 hours. Studies/Results: Pelvis Portable  Result Date: 09/18/2016 CLINICAL DATA:  LEFT total hip replacement. EXAM: PORTABLE PELVIS 1-2 VIEWS COMPARISON:  Radiograph 09/16/2016 FINDINGS: Interval LEFT hip total arthroplasty. No fracture dislocation. Surgical skin staples noted. Vascular calcifications noted. IMPRESSION: No complication following LEFT hip arthroplasty. Electronically Signed   By: Suzy Bouchard M.D.   On: 09/18/2016 11:20   Dg C-arm 61-120 Min  Result Date: 09/18/2016 CLINICAL DATA:  83 year old female with left hip fracture fixation EXAM: OPERATIVE LEFT HIP (WITH PELVIS IF PERFORMED) 2 VIEWS TECHNIQUE: Fluoroscopic spot image(s) were submitted for interpretation post-operatively. COMPARISON:  09/16/2016 FINDINGS: Intraoperative fluoroscopic spot images of left hip fracture repair. The acetabular and femoral components appear congruent, with single or cerclage wire at the proximal aspect of the left femur/ femoral stem. Gas within the soft tissues.  IMPRESSION: Intraoperative images of left hip arthroplasty for fracture repair, with no immediate complicating features. Please refer to the dictated operative report for full details of intraoperative findings and procedure. Electronically Signed   By: Corrie Mckusick D.O.   On: 09/18/2016 10:54   Dg Hip Operative Unilat W Or W/o Pelvis Left  Result Date: 09/18/2016 CLINICAL DATA:  83 year old female with left hip fracture fixation EXAM: OPERATIVE LEFT HIP (WITH PELVIS IF PERFORMED) 2 VIEWS TECHNIQUE: Fluoroscopic spot image(s) were submitted for interpretation post-operatively. COMPARISON:  09/16/2016 FINDINGS: Intraoperative fluoroscopic spot images of left hip fracture repair. The acetabular and femoral components appear congruent, with single or cerclage wire at the proximal aspect of the left femur/ femoral stem. Gas within the soft tissues. IMPRESSION: Intraoperative images of left hip arthroplasty for fracture repair, with no immediate complicating features. Please refer to the dictated operative report for full details of intraoperative findings and procedure. Electronically Signed   By: Corrie Mckusick D.O.   On: 09/18/2016 10:54   Medications: Infusions: . sodium chloride    . sodium chloride 50 mL/hr at 09/18/16 1131  .  ceFAZolin (ANCEF) IV    . cefTRIAXone (ROCEPHIN)  IV Stopped (09/17/16 1249)  . ferric gluconate (FERRLECIT/NULECIT) IV Stopped (09/16/16 2335)    Scheduled Medications: . amLODipine  5 mg Oral QHS  . [START ON 09/19/2016] aspirin EC  325 mg Oral Q breakfast  . atorvastatin  80 mg Oral QHS  . calcitRIOL  0.25 mcg Oral Q T,Th,Sa-HD  . calcium carbonate  3,125 mg Oral TID WC  . heparin  5,000 Units Subcutaneous Q8H  . insulin aspart  0-9 Units Subcutaneous TID WC  . metoprolol tartrate  12.5 mg Oral BID  .  morphine injection  4 mg Intravenous Once  . multivitamin  1 tablet Oral QHS  . ondansetron (ZOFRAN) IV  4 mg Intravenous Once  . pantoprazole  40 mg Oral BID     have reviewed scheduled and prn medications.  Physical Exam: General: NAD, nodding off during our conversation Heart: RRR Lungs: few scattered rhonchi clears with coughing Abdomen: soft, non tender Extremities: no edema, 2+ DP pulses Dialysis Access: left AVF     09/18/2016,5:46 PM  LOS: 2 days

## 2016-09-18 NOTE — Procedures (Signed)
Patient seen and examined on Hemodialysis. QB 400 mL/ min via L AVF, UF goal 2L.  No complaints so far, tolerating treatment OK.  Treatment adjusted as needed.  Madelon Lips MD [facility] pgr [phone] 5:52 PM

## 2016-09-18 NOTE — Anesthesia Preprocedure Evaluation (Addendum)
Anesthesia Evaluation  Patient identified by MRN, date of birth, ID band Patient awake    Reviewed: Allergy & Precautions, H&P , NPO status , Patient's Chart, lab work & pertinent test results, reviewed documented beta blocker date and time   Airway Mallampati: II  TM Distance: >3 FB Neck ROM: Full    Dental no notable dental hx. (+) Edentulous Upper, Edentulous Lower, Dental Advisory Given   Pulmonary neg pulmonary ROS, former smoker,    Pulmonary exam normal breath sounds clear to auscultation       Cardiovascular hypertension, Pt. on medications and Pt. on home beta blockers + CAD, + Peripheral Vascular Disease and +CHF   Rhythm:Regular Rate:Normal     Neuro/Psych negative neurological ROS  negative psych ROS   GI/Hepatic Neg liver ROS, GERD  Medicated and Controlled,  Endo/Other  negative endocrine ROS  Renal/GU ESRF and DialysisRenal disease  negative genitourinary   Musculoskeletal  (+) Arthritis , Osteoarthritis,    Abdominal   Peds  Hematology negative hematology ROS (+) anemia ,   Anesthesia Other Findings   Reproductive/Obstetrics negative OB ROS                            Anesthesia Physical Anesthesia Plan  ASA: III  Anesthesia Plan: Spinal   Post-op Pain Management:    Induction: Intravenous  PONV Risk Score and Plan: 3 and Ondansetron, Dexamethasone and Propofol  Airway Management Planned: Simple Face Mask  Additional Equipment:   Intra-op Plan:   Post-operative Plan:   Informed Consent: I have reviewed the patients History and Physical, chart, labs and discussed the procedure including the risks, benefits and alternatives for the proposed anesthesia with the patient or authorized representative who has indicated his/her understanding and acceptance.   Dental advisory given  Plan Discussed with: CRNA and Surgeon  Anesthesia Plan Comments:         Anesthesia Quick Evaluation

## 2016-09-18 NOTE — Anesthesia Procedure Notes (Signed)
Spinal  Patient location during procedure: OR Start time: 09/18/2016 7:40 AM End time: 09/18/2016 7:50 AM Staffing Anesthesiologist: Roderic Palau Performed: anesthesiologist  Preanesthetic Checklist Completed: patient identified, surgical consent, pre-op evaluation, timeout performed, IV checked, risks and benefits discussed and monitors and equipment checked Spinal Block Patient position: right lateral decubitus Prep: DuraPrep Patient monitoring: cardiac monitor, continuous pulse ox and blood pressure Approach: midline Location: L3-4 Injection technique: single-shot Needle Needle type: Quincke  Needle gauge: 22 G Needle length: 9 cm Assessment Sensory level: T8 Additional Notes Functioning IV was confirmed and monitors were applied. Sterile prep and drape, including hand hygiene and sterile gloves were used. The patient was positioned and the spine was prepped. The skin was anesthetized with lidocaine.  Free flow of clear CSF was obtained prior to injecting local anesthetic into the CSF.  The spinal needle aspirated freely following injection.  The needle was carefully withdrawn.  The patient tolerated the procedure well.

## 2016-09-18 NOTE — Transfer of Care (Signed)
Immediate Anesthesia Transfer of Care Note  Patient: [name]  Procedure(s) Performed: Procedure(s): ANTERIOR APPROACH HEMI HIP ARTHROPLASTY (Left)  Patient Location: PACU  Anesthesia Type:MAC and Spinal  Level of Consciousness: awake, alert  and oriented  Airway & Oxygen Therapy: Patient Spontanous Breathing and Patient connected to face mask oxygen  Post-op Assessment: Report given to RN, Post -op Vital signs reviewed and stable and Patient moving all extremities  Post vital signs: Reviewed and stable  Last Vitals:  Vitals:   09/18/16 1001 09/18/16 1016  BP: (!) 148/64 (!) 145/62  Pulse: 67 63  Resp: 14 13  Temp: 36.2 C     Last Pain:  Vitals:   09/18/16 1001  TempSrc:   PainSc: 0-No pain      Patients Stated Pain Goal: 3 (41/42/39 5320)  Complications: No apparent anesthesia complications

## 2016-09-18 NOTE — Op Note (Deleted)
  The note originally documented on this encounter has been moved the the encounter in which it belongs.  

## 2016-09-19 DIAGNOSIS — E46 Unspecified protein-calorie malnutrition: Secondary | ICD-10-CM

## 2016-09-19 DIAGNOSIS — R29810 Facial weakness: Secondary | ICD-10-CM

## 2016-09-19 DIAGNOSIS — Z992 Dependence on renal dialysis: Secondary | ICD-10-CM

## 2016-09-19 DIAGNOSIS — N186 End stage renal disease: Secondary | ICD-10-CM

## 2016-09-19 LAB — COMPREHENSIVE METABOLIC PANEL
ALBUMIN: 2.8 g/dL — AB (ref 3.5–5.0)
ALT: 17 U/L (ref 14–54)
AST: 48 U/L — ABNORMAL HIGH (ref 15–41)
Alkaline Phosphatase: 84 U/L (ref 38–126)
Anion gap: 11 (ref 5–15)
BUN: 17 mg/dL (ref 6–20)
CO2: 28 mmol/L (ref 22–32)
CREATININE: 3.43 mg/dL — AB (ref 0.44–1.00)
Calcium: 7.8 mg/dL — ABNORMAL LOW (ref 8.9–10.3)
Chloride: 98 mmol/L — ABNORMAL LOW (ref 101–111)
GFR calc non Af Amer: 11 mL/min — ABNORMAL LOW (ref >60)
GFR, EST AFRICAN AMERICAN: 13 mL/min — AB (ref >60)
GLUCOSE: 97 mg/dL (ref 65–99)
Potassium: 3.6 mmol/L (ref 3.5–5.1)
SODIUM: 137 mmol/L (ref 135–145)
Total Bilirubin: 0.9 mg/dL (ref 0.3–1.2)
Total Protein: 5.3 g/dL — ABNORMAL LOW (ref 6.5–8.1)

## 2016-09-19 LAB — TYPE AND SCREEN
ABO/RH(D): O POS
ANTIBODY SCREEN: NEGATIVE
Unit division: 0

## 2016-09-19 LAB — BPAM RBC
BLOOD PRODUCT EXPIRATION DATE: 201807172359
ISSUE DATE / TIME: 201806230920
UNIT TYPE AND RH: 5100

## 2016-09-19 LAB — CBC
HCT: 25.5 % — ABNORMAL LOW (ref 36.0–46.0)
HEMOGLOBIN: 8.2 g/dL — AB (ref 12.0–15.0)
MCH: 29 pg (ref 26.0–34.0)
MCHC: 32.2 g/dL (ref 30.0–36.0)
MCV: 90.1 fL (ref 78.0–100.0)
PLATELETS: 142 10*3/uL — AB (ref 150–400)
RBC: 2.83 MIL/uL — AB (ref 3.87–5.11)
RDW: 20.2 % — ABNORMAL HIGH (ref 11.5–15.5)
WBC: 6.5 10*3/uL (ref 4.0–10.5)

## 2016-09-19 LAB — GLUCOSE, CAPILLARY
GLUCOSE-CAPILLARY: 115 mg/dL — AB (ref 65–99)
GLUCOSE-CAPILLARY: 130 mg/dL — AB (ref 65–99)
GLUCOSE-CAPILLARY: 183 mg/dL — AB (ref 65–99)
GLUCOSE-CAPILLARY: 241 mg/dL — AB (ref 65–99)
GLUCOSE-CAPILLARY: 246 mg/dL — AB (ref 65–99)

## 2016-09-19 MED ORDER — HYDROCODONE-ACETAMINOPHEN 5-325 MG PO TABS: 1.0000 | ORAL_TABLET | ORAL | 0 refills | Status: AC | PRN

## 2016-09-19 MED ORDER — ASPIRIN 325 MG PO TBEC: 325.0000 mg | DELAYED_RELEASE_TABLET | Freq: Every day | ORAL | 0 refills | Status: DC

## 2016-09-19 MED ORDER — DARBEPOETIN ALFA 100 MCG/0.5ML IJ SOSY
100.0000 ug | PREFILLED_SYRINGE | INTRAMUSCULAR | Status: DC
  Filled 2016-09-19: qty 0.5

## 2016-09-19 MED ORDER — DIPHENHYDRAMINE HCL 50 MG/ML IJ SOLN
25.0000 mg | Freq: Once | INTRAMUSCULAR | Status: AC
  Administered 2016-09-19: 25 mg via INTRAVENOUS
  Filled 2016-09-19: qty 1

## 2016-09-19 MED ORDER — DIPHENHYDRAMINE HCL 25 MG PO CAPS: 25.0000 mg | ORAL_CAPSULE | Freq: Four times a day (QID) | ORAL | Status: DC | PRN

## 2016-09-19 MED ORDER — PRO-STAT SUGAR FREE PO LIQD
30.0000 mL | Freq: Two times a day (BID) | ORAL | Status: DC
  Administered 2016-09-19 – 2016-09-21 (×3): 30 mL via ORAL
  Filled 2016-09-19 (×4): qty 30

## 2016-09-19 NOTE — Progress Notes (Addendum)
Patient ID: [name], female   DOB: 1931/07/28, 83 y.o.   MRN: [id number]                                                                PROGRESS NOTE                                                                                                                                                                                                             Patient Demographics:    [name], is a 83 y.o. female, DOB - Nov 04, 1931, OYD:[id number]  Admit date - 09/16/2016   Admitting Physician [name], MD  Outpatient Primary MD for the patient is [name], MD  LOS - 3  Outpatient Specialists:     Chief Complaint  Patient presents with  . Hip Pain       Brief Narrative  83 y.o.femalewith Extensive medical history   CAD that is medically managed, ESRD on HD ([name]), HTN, HLD, pulmonary hypertension, and anemia. She presented to [facility] with left hip pain and was found to have a left femoral neck fracture   Subjective:    [name] today has been afebrile.  Per RN slight right facial swelling ,  Pt unable to tell if this is old or new.  Poor vision.   No headache, No chest pain, No abdominal pain - No Nausea, No new weakness tingling or numbness, No Cough - SOB beyond her baseline.    Assessment  & Plan :    Active Problems:   Hyperlipidemia   Hypertension   Chronic Facial droop   ESRD on dialysis (HCC)   GERD (gastroesophageal reflux disease)   Mood disorder (HCC)   Systolic murmur   Imbalance   GAVE (gastric antral vascular ectasia)   Exertional dyspnea   CAD-severe multivessel, not CABG candidate   Forehead laceration   Chronic respiratory failure with hypoxia, on home O2 therapy (HCC)   Iron deficiency anemia due to chronic blood loss   S/p left hip fracture   Hyperglycemia   Hip fracture (HCC)   Closed fracture of neck of left femur (HCC)   Congestive heart failure (CHF) (HCC)   Slight swelling right side of face per RN Benadryl   Left fem neck  Fracture   status post  hemiarthroplasty of the left, by Dr. Erlinda Hong. 6/23   PT/OT following  Social work consult placed by RN, agree with this  Lives at home alone and will need rehab   ESRD on HD  Tuesday Thursday Saturday  Primary Nephrologist Dr. Blanchard Mane Nephrology following     Multivessel CAD  last cardiac catheterization 03/2015 on medical RX . Felt not to be a candidate for PCI or bypass surgery Continue ASA, after surgery high dose statin, beta blockers most recent 2-D echo 07/15/16, EF 65-70%, shows pulmonary hypertension  Pulmonary hypertension Cont o2 [place] Consider pulmonary consultation as outpatient  Hypertension  Controlled Continue home anti-hypertensive medications after HD    Hyperlipidemia Continue home statins  GERD,no acute symptoms Continue PPI  Anemia of chronic disease/Post op anemiaHemoglobin on admission 11.2 at baseline Receives Aranesp as OP  Cbc in am ,   Thrombocytopenia Cbc in am  Hyperglycemia, patient denies H/O DM, current glu 119  Hemoglobin A1c 6.0, continue Accu-Cheks, continue sliding scale insulin  Chronic respiratory failure with hypoxia, wears 2 L home O2 prn, no diagnosis of COPD. NO Tobacco., NAD. CXR without infiltrate O2 in the low 90s in RA, continue oxygen via ,    UTI urine culture pending ? Cant see culture on epic  Continue rocephin 6/22=>  Severe protein calorie malnutrition Start prostat    DVT prophylaxsis heparin  Code Status:  Full code   Family Communication: Discussed in detail with the patient, all imaging results, lab results explained to the patient   Disposition Plan:  Likely needs SNF       Consultants:  Orthopedics  Nephrology  Cardiology  Procedures:  None    Lab Results  Component Value Date   PLT 142 (L) 09/19/2016    Antibiotics  :   ancef 6/22 Rocephin 6/22=>  Anti-infectives    Start     Dose/Rate Route Frequency Ordered Stop   09/18/16  1930  ceFAZolin (ANCEF) IVPB 2g/100 mL premix     2 g 200 mL/hr over 30 Minutes Intravenous Every 12 hours 09/18/16 1119 09/19/16 0641   09/17/16 1000  ceFAZolin (ANCEF) IVPB 2g/100 mL premix     2 g 200 mL/hr over 30 Minutes Intravenous To [facility] 09/17/16 0955 09/18/16 0735   09/17/16 1000  cefTRIAXone (ROCEPHIN) 1 g in dextrose 5 % 50 mL IVPB     1 g 100 mL/hr over 30 Minutes Intravenous Every 24 hours 09/17/16 0945          Objective:   Vitals:   09/18/16 2005 09/18/16 2055 09/19/16 0023 09/19/16 0516  BP: (!) 119/52 (!) 90/36 (!) 128/50 (!) 127/44  Pulse: 82 85 80 85  Resp: 15 16 16 16   Temp: 97.4 F (36.3 C) 98.3 F (36.8 C) 98.4 F (36.9 C) 98.7 F (37.1 C)  TempSrc: Oral Oral Oral Oral  SpO2: 100% 100% 100% 99%  Weight: 51.6 kg (113 lb 12.1 oz)     Height:        Wt Readings from Last 3 Encounters:  09/18/16 51.6 kg (113 lb 12.1 oz)  08/12/16 49.4 kg (109 lb)  07/27/16 50.1 kg (110 lb 8 oz)     Intake/Output Summary (Last 24 hours) at 09/19/16 0912 Last data filed at 09/19/16 0535  Gross per 24 hour  Intake          1698.33 ml  Output             -950 ml  Net  2648.33 ml     Physical Exam  Awake Alert, Oriented X 3, No new F.N deficits, Normal affect [place].AT,PERRAL, slight right facial swelling, difficult to tell if old or not Right facial droop (old) Supple Neck,No JVD, No cervical lymphadenopathy appriciated.  Symmetrical Chest wall movement, Good air movement bilaterally, CTAB RRR,No Gallops,Rubs or new Murmurs, No Parasternal Heave +ve B.Sounds, Abd Soft, No tenderness, No organomegaly appriciated, No rebound - guarding or rigidity. No Cyanosis, Clubbing or edema, No new Rash or bruise      Data Review:    CBC  Recent Labs Lab 09/16/16 1419 09/17/16 0610 09/17/16 2214 09/18/16 1743 09/19/16 0411  WBC 6.5 7.0 6.4 8.0 6.5  HGB 11.2* 10.4* 10.4* 8.4* 8.2*  HCT 34.8* 32.5* 33.2* 26.4* 25.5*  PLT 231 213 229 182 142*    MCV 89.2 91.3 91.5 89.2 90.1  MCH 28.7 29.2 28.7 28.4 29.0  MCHC 32.2 32.0 31.3 31.8 32.2  RDW 18.9* 19.3* 19.5* 19.5* 20.2*    Chemistries   Recent Labs Lab 09/16/16 1419 09/17/16 0610 09/17/16 2214 09/18/16 1744 09/19/16 0411  NA 139 139 138 137 137  K 4.3 4.1 4.1 4.5 3.6  CL 96* 98* 98* 101 98*  CO2 25 29 29 22 28   GLUCOSE 119* 108* 110* 117* 97  BUN 58* 25* 36* 53* 17  CREATININE 7.07* 4.19* 5.68* 6.68* 3.43*  CALCIUM 8.9 8.3* 8.3* 7.6* 7.8*  AST  --   --   --   --  48*  ALT  --   --   --   --  17  ALKPHOS  --   --   --   --  84  BILITOT  --   --   --   --  0.9   ------------------------------------------------------------------------------------------------------------------ No results for input(s): CHOL, HDL, LDLCALC, TRIG, CHOLHDL, LDLDIRECT in the last 72 hours.  Lab Results  Component Value Date   HGBA1C 6.0 (H) 09/16/2016   ------------------------------------------------------------------------------------------------------------------ No results for input(s): TSH, T4TOTAL, T3FREE, THYROIDAB in the last 72 hours.  Invalid input(s): FREET3 ------------------------------------------------------------------------------------------------------------------ No results for input(s): VITAMINB12, FOLATE, FERRITIN, TIBC, IRON, RETICCTPCT in the last 72 hours.  Coagulation profile  Recent Labs Lab 09/16/16 1419 09/17/16 0610  INR 1.03 1.07    No results for input(s): DDIMER in the last 72 hours.  Cardiac Enzymes  Recent Labs Lab 09/16/16 1656  TROPONINI 0.05*   ------------------------------------------------------------------------------------------------------------------ No results found for: BNP  Inpatient Medications  Scheduled Meds: . amLODipine  5 mg Oral QHS  . aspirin EC  325 mg Oral Q breakfast  . atorvastatin  80 mg Oral QHS  . calcitRIOL  0.25 mcg Oral Q T,Th,Sa-HD  . calcium carbonate  3,125 mg Oral TID WC  . diphenhydrAMINE  25 mg  Intravenous Once  . heparin  5,000 Units Subcutaneous Q8H  . insulin aspart  0-9 Units Subcutaneous TID WC  . metoprolol tartrate  12.5 mg Oral BID  .  morphine injection  4 mg Intravenous Once  . multivitamin  1 tablet Oral QHS  . ondansetron (ZOFRAN) IV  4 mg Intravenous Once  . pantoprazole  40 mg Oral BID   Continuous Infusions: . sodium chloride    . sodium chloride 50 mL/hr at 09/18/16 1131  . cefTRIAXone (ROCEPHIN)  IV 1 g (09/19/16 0909)  . ferric gluconate (FERRLECIT/NULECIT) IV Stopped (09/18/16 2039)   PRN Meds:.acetaminophen **OR** acetaminophen, bisacodyl, diphenhydrAMINE, HYDROcodone-acetaminophen, menthol-cetylpyridinium **OR** phenol, metoCLOPramide **OR** metoCLOPramide (REGLAN) injection, morphine injection, ondansetron **OR** ondansetron (  ZOFRAN) IV, Polyethyl Glycol-Propyl Glycol, senna-docusate, sodium phosphate  Micro Results Recent Results (from the past 240 hour(s))  Surgical pcr screen     Status: None   Collection Time: 09/17/16  2:04 AM  Result Value Ref Range Status   MRSA, PCR NEGATIVE NEGATIVE Final   Staphylococcus aureus NEGATIVE NEGATIVE Final    Comment:        The Xpert SA Assay (FDA approved for NASAL specimens in patients over 30 years of age), is one component of a comprehensive surveillance program.  Test performance has been validated by [facility] for patients greater than or equal to 67 year old. It is not intended to diagnose infection nor to guide or monitor treatment.     Radiology Reports Pelvis Portable  Result Date: 09/18/2016 CLINICAL DATA:  LEFT total hip replacement. EXAM: PORTABLE PELVIS 1-2 VIEWS COMPARISON:  Radiograph 09/16/2016 FINDINGS: Interval LEFT hip total arthroplasty. No fracture dislocation. Surgical skin staples noted. Vascular calcifications noted. IMPRESSION: No complication following LEFT hip arthroplasty. Electronically Signed   By: Suzy Bouchard M.D.   On: 09/18/2016 11:20   Dg Chest Port 1  View  Result Date: 09/16/2016 CLINICAL DATA:  Preop for left hip fracture repair. EXAM: PORTABLE CHEST 1 VIEW COMPARISON:  Radiographs of June 16, 2016. FINDINGS: Stable cardiomegaly and central pulmonary vascular congestion. Atherosclerosis of thoracic aorta is noted. No pneumothorax or pleural effusion is noted. Stable elevated right hemidiaphragm is noted. No consolidative process is noted. Bony thorax is unremarkable. IMPRESSION: Stable cardiomegaly and central pulmonary vascular congestion. Aortic atherosclerosis. No consolidative process is noted. Electronically Signed   By: Marijo Conception, M.D.   On: 09/16/2016 14:17   Dg C-arm 61-120 Min  Result Date: 09/18/2016 CLINICAL DATA:  83 year old female with left hip fracture fixation EXAM: OPERATIVE LEFT HIP (WITH PELVIS IF PERFORMED) 2 VIEWS TECHNIQUE: Fluoroscopic spot image(s) were submitted for interpretation post-operatively. COMPARISON:  09/16/2016 FINDINGS: Intraoperative fluoroscopic spot images of left hip fracture repair. The acetabular and femoral components appear congruent, with single or cerclage wire at the proximal aspect of the left femur/ femoral stem. Gas within the soft tissues. IMPRESSION: Intraoperative images of left hip arthroplasty for fracture repair, with no immediate complicating features. Please refer to the dictated operative report for full details of intraoperative findings and procedure. Electronically Signed   By: Corrie Mckusick D.O.   On: 09/18/2016 10:54   Dg Hip Operative Unilat W Or W/o Pelvis Left  Result Date: 09/18/2016 CLINICAL DATA:  83 year old female with left hip fracture fixation EXAM: OPERATIVE LEFT HIP (WITH PELVIS IF PERFORMED) 2 VIEWS TECHNIQUE: Fluoroscopic spot image(s) were submitted for interpretation post-operatively. COMPARISON:  09/16/2016 FINDINGS: Intraoperative fluoroscopic spot images of left hip fracture repair. The acetabular and femoral components appear congruent, with single or cerclage  wire at the proximal aspect of the left femur/ femoral stem. Gas within the soft tissues. IMPRESSION: Intraoperative images of left hip arthroplasty for fracture repair, with no immediate complicating features. Please refer to the dictated operative report for full details of intraoperative findings and procedure. Electronically Signed   By: Corrie Mckusick D.O.   On: 09/18/2016 10:54   Dg Hip Unilat W Or W/o Pelvis 2-3 Views Left  Result Date: 09/16/2016 CLINICAL DATA:  83 year old female with 6 weeks of left hip pain, no known injury. EXAM: DG HIP (WITH OR WITHOUT PELVIS) 2-3V LEFT COMPARISON:  PET-CT 07/26/2005 FINDINGS: Aortoiliac calcified atherosclerosis. Bilateral calcified femoral artery atherosclerosis. Osteopenia. The pelvis appears intact. Grossly intact proximal  right femur. The left femoral head is normally located. There is a left femoral neck fracture with mild varus angulation, no other displacement. The proximal left femur intertrochanteric segment appears to remain intact. Proximal left femoral shaft is intact. IMPRESSION: 1. Left femoral neck fracture with mild varus angulation appears acute versus subacute. 2. Osteopenia.  No other acute osseous abnormality identified. 3. Calcified aortic and iliofemoral atherosclerosis. Electronically Signed   By: Genevie Ann M.D.   On: 09/16/2016 12:50    Time Spent in minutes  30   Jani Gravel M.D on 09/19/2016 at 9:12 AM  Between 7am to 7pm - Pager - [phone]  After 7pm go to [email or web address] - password [facility]  [facility] -  Office  [phone]

## 2016-09-19 NOTE — Progress Notes (Signed)
Subjective:  Feels terrible is in a lot of pain- did HD yest only 3 hours, stopped early due to low BPs and no UF  Objective Vital signs in last 24 hours: Vitals:   09/18/16 2005 09/18/16 2055 09/19/16 0023 09/19/16 0516  BP: (!) 119/52 (!) 90/36 (!) 128/50 (!) 127/44  Pulse: 82 85 80 85  Resp: 15 16 16 16   Temp: 97.4 F (36.3 C) 98.3 F (36.8 C) 98.4 F (36.9 C) 98.7 F (37.1 C)  TempSrc: Oral Oral Oral Oral  SpO2: 100% 100% 100% 99%  Weight: 51.6 kg (113 lb 12.1 oz)     Height:       Weight change:   Intake/Output Summary (Last 24 hours) at 09/19/16 1009 Last data filed at 09/19/16 0900  Gross per 24 hour  Intake          1233.33 ml  Output            -1150 ml  Net          2383.33 ml    Dialysis Orders: [facility], T,Th,S 3 hrs. 160 NRe 350/600 49.5 kg 2.0 K/2.0 Ca  Linear Na/UF Profile 4 -No Heparin -Mircera 50 mcg IV Q 4 weeks (last dose 09/11/16 HGB 10.5 09/14/16 ) -Venofer 100 mg IV X 10 doses (has not been started yet-Fe 71 Tsat 27% Ferritin 169 09/08/16) -Calcitriol 0.25 mcg PO TIW (Last PTH 197 09/08/16)  BMD meds: TUMS 3000 mg PO TID AC (Last Ca 9.4 C Ca 9.6 phos 4.2 09/08/16)  Assessment/Plan: 1. L Hip Fracture: Orthopedic consult. S/p L hip arthroplasty 6/23.  Much pain- have informed nursing 2.  ESRD -  T,TH,S via LUA AVF. K+ 3.6 . HD 6/23 on regular schedule had to be ended early and did not get any UF.  Hopefully will hold her until Tuesday  3.  Hypertension/volume  - Metoprolol 12.5 mg PO BID, Amlodipine 10 mg PO Q HS on OP med list. Vascular congestion noted on CXR, does not sound or look wet. Unable to do UF yest due to low BPs- will hold amlodipine- cont low dose lopressor 4.  Anemia  - HGB 11.2- down to 10.4-->8.2, after surgery. Recent OP ESA dose. Follow HGB giving nulecit and will gove dose of darbe  5.  Metabolic bone disease -  Cont oscal, rocaltrol 6.  Nutrition - Renal diet, renal vit/nepro 7. H/O multi-vessel CAD: No chest  pain reported. Was considered not a candidate for CABG in past  8. H/O GI bleed:  no recent issues. hgb now down likely due to surgery   Amarylis Rovito A    Labs: Basic Metabolic Panel:  Recent Labs Lab 09/17/16 2214 09/18/16 1744 09/19/16 0411  NA 138 137 137  K 4.1 4.5 3.6  CL 98* 101 98*  CO2 29 22 28   GLUCOSE 110* 117* 97  BUN 36* 53* 17  CREATININE 5.68* 6.68* 3.43*  CALCIUM 8.3* 7.6* 7.8*  PHOS 4.8* 6.8*  --    Liver Function Tests:  Recent Labs Lab 09/17/16 2214 09/18/16 1744 09/19/16 0411  AST  --   --  48*  ALT  --   --  17  ALKPHOS  --   --  84  BILITOT  --   --  0.9  PROT  --   --  5.3*  ALBUMIN 2.8* 2.6* 2.8*   No results for input(s): LIPASE, AMYLASE in the last 168 hours. No results for input(s): AMMONIA in the last 168  hours. CBC:  Recent Labs Lab 09/16/16 1419 09/17/16 0610 09/17/16 2214 09/18/16 1743 09/19/16 0411  WBC 6.5 7.0 6.4 8.0 6.5  HGB 11.2* 10.4* 10.4* 8.4* 8.2*  HCT 34.8* 32.5* 33.2* 26.4* 25.5*  MCV 89.2 91.3 91.5 89.2 90.1  PLT 231 213 229 182 142*   Cardiac Enzymes:  Recent Labs Lab 09/16/16 1656  TROPONINI 0.05*   CBG:  Recent Labs Lab 09/17/16 2157 09/18/16 0548 09/18/16 2059 09/19/16 0022 09/19/16 0628  GLUCAP 121* 98 80 115* 130*    Iron Studies: No results for input(s): IRON, TIBC, TRANSFERRIN, FERRITIN in the last 72 hours. Studies/Results: Pelvis Portable  Result Date: 09/18/2016 CLINICAL DATA:  LEFT total hip replacement. EXAM: PORTABLE PELVIS 1-2 VIEWS COMPARISON:  Radiograph 09/16/2016 FINDINGS: Interval LEFT hip total arthroplasty. No fracture dislocation. Surgical skin staples noted. Vascular calcifications noted. IMPRESSION: No complication following LEFT hip arthroplasty. Electronically Signed   By: Suzy Bouchard M.D.   On: 09/18/2016 11:20   Dg C-arm 61-120 Min  Result Date: 09/18/2016 CLINICAL DATA:  83 year old female with left hip fracture fixation EXAM: OPERATIVE LEFT HIP (WITH  PELVIS IF PERFORMED) 2 VIEWS TECHNIQUE: Fluoroscopic spot image(s) were submitted for interpretation post-operatively. COMPARISON:  09/16/2016 FINDINGS: Intraoperative fluoroscopic spot images of left hip fracture repair. The acetabular and femoral components appear congruent, with single or cerclage wire at the proximal aspect of the left femur/ femoral stem. Gas within the soft tissues. IMPRESSION: Intraoperative images of left hip arthroplasty for fracture repair, with no immediate complicating features. Please refer to the dictated operative report for full details of intraoperative findings and procedure. Electronically Signed   By: Corrie Mckusick D.O.   On: 09/18/2016 10:54   Dg Hip Operative Unilat W Or W/o Pelvis Left  Result Date: 09/18/2016 CLINICAL DATA:  83 year old female with left hip fracture fixation EXAM: OPERATIVE LEFT HIP (WITH PELVIS IF PERFORMED) 2 VIEWS TECHNIQUE: Fluoroscopic spot image(s) were submitted for interpretation post-operatively. COMPARISON:  09/16/2016 FINDINGS: Intraoperative fluoroscopic spot images of left hip fracture repair. The acetabular and femoral components appear congruent, with single or cerclage wire at the proximal aspect of the left femur/ femoral stem. Gas within the soft tissues. IMPRESSION: Intraoperative images of left hip arthroplasty for fracture repair, with no immediate complicating features. Please refer to the dictated operative report for full details of intraoperative findings and procedure. Electronically Signed   By: Corrie Mckusick D.O.   On: 09/18/2016 10:54   Medications: Infusions: . sodium chloride    . sodium chloride 50 mL/hr at 09/18/16 1131  . cefTRIAXone (ROCEPHIN)  IV 1 g (09/19/16 0909)  . ferric gluconate (FERRLECIT/NULECIT) IV Stopped (09/18/16 2039)    Scheduled Medications: . amLODipine  5 mg Oral QHS  . aspirin EC  325 mg Oral Q breakfast  . atorvastatin  80 mg Oral QHS  . calcitRIOL  0.25 mcg Oral Q T,Th,Sa-HD  .  calcium carbonate  3,125 mg Oral TID WC  . feeding supplement (PRO-STAT SUGAR FREE 64)  30 mL Oral BID  . heparin  5,000 Units Subcutaneous Q8H  . insulin aspart  0-9 Units Subcutaneous TID WC  . metoprolol tartrate  12.5 mg Oral BID  .  morphine injection  4 mg Intravenous Once  . multivitamin  1 tablet Oral QHS  . ondansetron (ZOFRAN) IV  4 mg Intravenous Once  . pantoprazole  40 mg Oral BID    have reviewed scheduled and prn medications.  Physical Exam: General: sitting up in bedside chair says  feels terrible due to pain Heart: RRR Lungs: few scattered rhonchi clears with coughing Abdomen: soft, non tender Extremities: no edema, 2+ DP pulses Dialysis Access: left AVF     09/19/2016,10:09 AM  LOS: 3 days

## 2016-09-19 NOTE — Evaluation (Signed)
Physical Therapy Evaluation Patient Details Name: [name] MRN: [id number] DOB: 10-13-1931 Today's Date: 09/19/2016   History of Present Illness  83 y.o.femalepresenting with one to 2 months history of left hip pain, initially diagnosed with bursitis - denies recent falls. Pt was brought in by EMS on 09/16/16 and x-rays showed a left femoral neck fx and orthopedic surgery was consulted. Underwent left direct anterior THA on 09/18/16. PHM includingESRD on hemodialysis Tuesday Thursday Saturday, Osteoporosis, hypertension, hyperlipidemia, CAD, and chronic anemia.  Clinical Impression   Patient is s/p Lt direct anterior THA due to fracture resulting in functional limitations due to the deficits listed below (see PT Problem List). Patient very fatigued/lethargic on evaluation (had been up in chair almost 4 hours). On return to bed she was more alert and were able to discuss discharge plan. Patient understands she cannot go home alone and will need SNF level of therapies. Patient will benefit from skilled PT to increase their independence and safety with mobility to allow discharge to the venue listed below.       Follow Up Recommendations SNF;Supervision/Assistance - 24 hour    Equipment Recommendations  Rolling walker with 5" wheels    Recommendations for Other Services       Precautions / Restrictions Precautions Precautions: Fall Restrictions Weight Bearing Restrictions: Yes LLE Weight Bearing: Weight bearing as tolerated      Mobility  Bed Mobility Overal bed mobility: Needs Assistance Bed Mobility: Sit to Supine       Sit to supine: Total assist   General bed mobility comments: total assist to initiate transition; pt slightly helped with raising RLE onto bed  Transfers Overall transfer level: Needs assistance Equipment used: Rolling walker (2 wheeled) Transfers: Sit to/from [employer] Transfers Sit to Stand: Max assist Stand pivot transfers: Max assist        General transfer comment: from recliner to bed; pt with strong posterior lean and initially resisted wt-shift forward over her feet; progressed to stand with min assist, however could not process how to wt-shift to advance LLE during pivot  Ambulation/Gait             General Gait Details: unable due to level of arousal/fatigue  Stairs            Wheelchair Mobility    Modified Rankin (Stroke Patients Only)       Balance Overall balance assessment: Needs assistance Sitting-balance support: Feet supported;Bilateral upper extremity supported Sitting balance-Leahy Scale: Poor     Standing balance support: Bilateral upper extremity supported;During functional activity Standing balance-Leahy Scale: Poor Standing balance comment: Reliant on RW and physical A for standing balance                             Pertinent Vitals/Pain Pain Assessment: Faces Faces Pain Scale: Hurts little more Pain Location: L hip Pain Descriptors / Indicators: Discomfort Pain Intervention(s): Limited activity within patient's tolerance;Monitored during session;Repositioned    Home Living Family/patient expects to be discharged to:: Private residence Living Arrangements: Alone Available Help at Discharge: Friend(s) (Good friend who checks in) Type of Home: House Home Access: Level entry ("Through the garage entering the laundry room")     Home Layout: One level Home Equipment: Grab bars - tub/shower;Hand held shower head;Walker - 4 wheels;Shower seat - built in Agricultural consultant) Additional Comments: Pt says she also has an emergency button (RN reports that pt is on 2L home O2)    Prior  Function Level of Independence: Independent         Comments: ADLs, IADLs, driving - "Usually I plant flowers, this year I havent."     Hand Dominance   Dominant Hand: Right    Extremity/Trunk Assessment   Upper Extremity Assessment Upper Extremity Assessment: Defer to OT evaluation     Lower Extremity Assessment Lower Extremity Assessment: LLE deficits/detail LLE Deficits / Details: Left hip fx. THA on 09/18/16; required assist to advance LLE during stand-pivot with RW    Cervical / Trunk Assessment Cervical / Trunk Assessment: Kyphotic  Communication   Communication: No difficulties  Cognition Arousal/Alertness: Lethargic Behavior During Therapy: Flat affect (very tired) Overall Cognitive Status: Difficult to assess                                        General Comments      Exercises Other Exercises Other Exercises: gentle AAROM to Lt hip/knee to achieve full extension once lying in bed   Assessment/Plan    PT Assessment Patient needs continued PT services  PT Problem List Decreased strength;Decreased range of motion;Decreased activity tolerance;Decreased balance;Decreased mobility;Decreased knowledge of use of DME;Decreased knowledge of precautions;Pain       PT Treatment Interventions DME instruction;Gait training;Functional mobility training;Therapeutic activities;Therapeutic exercise;Balance training;Cognitive remediation;Patient/family education    PT Goals (Current goals can be found in the Care Plan section)  Acute Rehab PT Goals Patient Stated Goal: Get stronger and return to PLOF PT Goal Formulation: With patient Time For Goal Achievement: 10/03/16 Potential to Achieve Goals: Fair    Frequency Min 3X/week   Barriers to discharge Decreased caregiver support      Co-evaluation               AM-PAC PT "6 Clicks" Daily Activity  Outcome Measure Difficulty turning over in bed (including adjusting bedclothes, sheets and blankets)?: Total Difficulty moving from lying on back to sitting on the side of the bed? : Total Difficulty sitting down on and standing up from a chair with arms (e.g., wheelchair, bedside commode, etc,.)?: Total Help needed moving to and from a bed to chair (including a wheelchair)?: Total Help  needed walking in hospital room?: Total Help needed climbing 3-5 steps with a railing? : Total 6 Click Score: 6    End of Session Equipment Utilized During Treatment: Gait belt Activity Tolerance: Patient limited by lethargy Patient left: in bed;with call bell/phone within reach;with bed alarm set Nurse Communication: Mobility status PT Visit Diagnosis: Muscle weakness (generalized) (M62.81);Difficulty in walking, not elsewhere classified (R26.2)    Time: 5621-3086 PT Time Calculation (min) (ACUTE ONLY): 29 min   Charges:   PT Evaluation $PT Eval Moderate Complexity: 1 Procedure PT Treatments $Therapeutic Exercise: 8-22 mins   PT G Codes:          [employer], PT 09/19/2016, 12:19 PM

## 2016-09-19 NOTE — Progress Notes (Signed)
RN notified Hospitalists of patients blood pressure 104/33. MD advised to continue to monitor.

## 2016-09-19 NOTE — Discharge Instructions (Signed)

## 2016-09-19 NOTE — Progress Notes (Signed)
RN paged Hospitalists concerning patients blood pressure 96/30. MD advised to hold night time dose of metoprolol and to reassess patients blood pressure in one hour. Nursing will continue to monitor.

## 2016-09-19 NOTE — Progress Notes (Signed)
Subjective: 1 Day Post-Op Procedure(s) (LRB): ANTERIOR APPROACH HEMI HIP ARTHROPLASTY (Left) Patient reports pain as moderate.  Tolerated surgery well.  Acute blood loss anemia on top of chronic anemia.  Vitals stable.  Objective: Vital signs in last 24 hours: Temp:  [97.2 F (36.2 C)-98.7 F (37.1 C)] 98.7 F (37.1 C) (06/24 0516) Pulse Rate:  [63-85] 85 (06/24 0516) Resp:  [13-18] 16 (06/24 0516) BP: (60-170)/(29-64) 127/44 (06/24 0516) SpO2:  [97 %-100 %] 99 % (06/24 0516) Weight:  [111 lb 5.3 oz (50.5 kg)-113 lb 12.1 oz (51.6 kg)] 113 lb 12.1 oz (51.6 kg) (06/23 2005)  Intake/Output from previous day: 06/23 0701 - 06/24 0700 In: 1698.3 [I.V.:903.3; Blood:335; IV Piggyback:460] Out: -550 [Blood:600] Intake/Output this shift: No intake/output data recorded.   Recent Labs  09/16/16 1419 09/17/16 0610 09/17/16 2214 09/18/16 1743 09/19/16 0411  HGB 11.2* 10.4* 10.4* 8.4* 8.2*    Recent Labs  09/18/16 1743 09/19/16 0411  WBC 8.0 6.5  RBC 2.96* 2.83*  HCT 26.4* 25.5*  PLT 182 142*    Recent Labs  09/18/16 1744 09/19/16 0411  NA 137 137  K 4.5 3.6  CL 101 98*  CO2 22 28  BUN 53* 17  CREATININE 6.68* 3.43*  GLUCOSE 117* 97  CALCIUM 7.6* 7.8*    Recent Labs  09/16/16 1419 09/17/16 0610  INR 1.03 1.07    Intact pulses distally Dorsiflexion/Plantar flexion intact Incision: dressing C/D/I  Assessment/Plan: 1 Day Post-Op Procedure(s) (LRB): ANTERIOR APPROACH HEMI HIP ARTHROPLASTY (Left) Up with therapy - WBAT left hip. Will likely need skilled nursing post hospital stay.  Mcarthur Rossetti 09/19/2016, 9:29 AM

## 2016-09-19 NOTE — Evaluation (Signed)
Occupational Therapy Evaluation Patient Details Name: [name] MRN: [id number] DOB: [birth year] Today's Date: 09/19/2016    History of Present Illness 83 y.o.femalepresenting with one to 2 months history of left hip pain, initially diagnosed with bursitis - denies recent falls. Pt was brought in by EMS on 09/16/16 and x-rays showed a left femoral neck fx and orthopedic surgery was consulted. Underwent left direct anterior THA on 09/18/16. PHM includingESRD on hemodialysis Tuesday Thursday Saturday, Osteoporosis, hypertension, hyperlipidemia, CAD, and chronic anemia.   Clinical Impression   PTA, pt was living alone and was independent including ADLs, IADLs, and driving. Currently, pt requires Max A for LB ADLs and Max A for functional mobility using RW. Pt would benefit from continued OT to facilitate safe dc and increase her occupational performance. Recommend dc to SNF for further OT to increase pt safety and independence with ADLS and functional mobility prior to transitioning home.    Follow Up Recommendations  SNF;Supervision/Assistance - 24 hour    Equipment Recommendations  Other (comment) (Defer to next venue)    Recommendations for Other Services PT consult     Precautions / Restrictions Precautions Precautions: Fall Restrictions Weight Bearing Restrictions: Yes LLE Weight Bearing: Weight bearing as tolerated      Mobility Bed Mobility Overal bed mobility: Needs Assistance Bed Mobility: Supine to Sit     Supine to sit: Max assist     General bed mobility comments: Pt required Max A to elevate trunk from supine to sitting upright. Pt only required Min A to assist in transitioning LLE towards EOB.   Transfers Overall transfer level: Needs assistance Equipment used: Rolling walker (2 wheeled) Transfers: Sit to/from Stand Sit to Stand: Mod assist;From elevated surface         General transfer comment: Pt requiring Mod A to power into standing. Then requiring  Max A to steady self. Pt with tendency for posterior lean. Once pt got her balance, she was able to maintain static standing with Min A.. Feel pt would benefit from shorter RW.    Balance Overall balance assessment: Needs assistance Sitting-balance support: No upper extremity supported;Feet supported Sitting balance-Leahy Scale: Fair     Standing balance support: Bilateral upper extremity supported;During functional activity Standing balance-Leahy Scale: Poor Standing balance comment: Reliant on RW and physical A for standing balance                           ADL either performed or assessed with clinical judgement   ADL Overall ADL's : Needs assistance/impaired Eating/Feeding: Set up;Sitting   Grooming: Set up;Sitting   Upper Body Bathing: Set up;Sitting   Lower Body Bathing: Maximal assistance;Sit to/from stand   Upper Body Dressing : Set up;Sitting   Lower Body Dressing: Maximal assistance;Sit to/from stand   Toilet Transfer: Maximal assistance;RW;Stand-pivot (Simulated to recliner)           Functional mobility during ADLs: Maximal assistance;Rolling walker General ADL Comments: Pt demonstrating good motivated to participate in therapy. Pt took several steps towards recliner with flucuating Min-Max A due to posterior and anterior leaning.      Vision Baseline Vision/History: Wears glasses ("I wear contacts") Wears Glasses: At all times Patient Visual Report: No change from baseline       Perception     Praxis      Pertinent Vitals/Pain Pain Assessment: Faces Faces Pain Scale: Hurts a little bit Pain Location: L hip Pain Descriptors / Indicators: Discomfort ("Pulling") Pain  Intervention(s): Monitored during session;Repositioned     Hand Dominance Right   Extremity/Trunk Assessment Upper Extremity Assessment Upper Extremity Assessment: Overall WFL for tasks assessed   Lower Extremity Assessment Lower Extremity Assessment: LLE  deficits/detail;Defer to PT evaluation LLE Deficits / Details: Left hip fx. THA on 09/18/16   Cervical / Trunk Assessment Cervical / Trunk Assessment: Kyphotic   Communication Communication Communication: No difficulties   Cognition Arousal/Alertness: Awake/alert Behavior During Therapy: WFL for tasks assessed/performed Overall Cognitive Status: Within Functional Limits for tasks assessed                                     General Comments  Discussed dc plan to SNF - pt states she doesnt like the idea, but understands why it is the best option. Pt very appreciative of therapy.  Pt SpO2 92 sitting EOB on RA. Placed pt back on 3L O2.     Exercises     Shoulder Instructions      Home Living Family/patient expects to be discharged to:: Private residence Living Arrangements: Alone Available Help at Discharge: Friend(s) (Good friend who checks in) Type of Home: House Home Access: Level entry ("Through the garage entering the laundry room")     Home Layout: One level     Bathroom Shower/Tub: Walk-in shower;Tub/shower unit   Bathroom Toilet: Standard Bathroom Accessibility: Yes   Home Equipment: Grab bars - tub/shower;Hand held Tourist information centre manager - 4 wheels;Shower seat - built in Agricultural consultant)   Additional Comments: Pt says she also has an emergency button (RN reports that pt is on 2L home O2)      Prior Functioning/Environment Level of Independence: Independent        Comments: ADLs, IADLs, driving - "Usually I plant flowers, this year I havent."        OT Problem List: Decreased strength;Decreased range of motion;Decreased activity tolerance;Impaired balance (sitting and/or standing);Decreased safety awareness;Decreased knowledge of use of DME or AE;Decreased knowledge of precautions;Pain      OT Treatment/Interventions: Self-care/ADL training;Therapeutic exercise;Energy conservation;DME and/or AE instruction;Patient/family education;Therapeutic  activities    OT Goals(Current goals can be found in the care plan section) Acute Rehab OT Goals Patient Stated Goal: Get stronger and return to PLOF OT Goal Formulation: With patient Time For Goal Achievement: 10/03/16 Potential to Achieve Goals: Good ADL Goals Pt Will Perform Upper Body Bathing: with set-up;with supervision;sitting Pt Will Perform Lower Body Bathing: sit to/from stand;with min assist Pt Will Perform Lower Body Dressing: with min assist;sit to/from stand Pt Will Transfer to Toilet: with min assist;ambulating;bedside commode Pt Will Perform Toileting - Clothing Manipulation and hygiene: with min assist;sit to/from stand  OT Frequency: Min 2X/week   Barriers to D/C: Decreased caregiver support  Pt lives alone       Co-evaluation              AM-PAC PT "6 Clicks" Daily Activity     Outcome Measure Help from another person eating meals?: None Help from another person taking care of personal grooming?: A Little Help from another person toileting, which includes using toliet, bedpan, or urinal?: A Lot Help from another person bathing (including washing, rinsing, drying)?: A Lot Help from another person to put on and taking off regular upper body clothing?: A Little Help from another person to put on and taking off regular lower body clothing?: A Lot 6 Click Score: 16   End of Session Equipment  Utilized During Treatment: Gait belt;Rolling walker;Oxygen (3L) Nurse Communication: Mobility status;Other (comment) (SpO2 stats)  Activity Tolerance: Patient tolerated treatment well Patient left: in chair;with call bell/phone within reach  OT Visit Diagnosis: Unsteadiness on feet (R26.81);Other abnormalities of gait and mobility (R26.89);Muscle weakness (generalized) (M62.81);Pain Pain - Right/Left: Left Pain - part of body: Hip                Time: 0734-0800 OT Time Calculation (min): 26 min Charges:  OT General Charges $OT Visit: 1 Procedure OT Evaluation $OT  Eval Low Complexity: 1 Procedure OT Treatments $Self Care/Home Management : 8-22 mins G-Codes:     Sherice Ijames MSOT, OTR/L Acute Rehab Pager: [phone] Office: [place] 09/19/2016, 8:34 AM

## 2016-09-19 NOTE — Progress Notes (Signed)
[facility] Progress Note   Subjective:  Very sleepy today, c/o L leg pain. Denies CP or dyspnea, but on nasal oxygen 3L/min and does seem to have facial edema compared to her usual. Essentially no UF with HD on 6/23 due to hypotension.  Objective Vitals:   09/18/16 2005 09/18/16 2055 09/19/16 0023 09/19/16 0516  BP: (!) 119/52 (!) 90/36 (!) 128/50 (!) 127/44  Pulse: 82 85 80 85  Resp: 15 16 16 16   Temp: 97.4 F (36.3 C) 98.3 F (36.8 C) 98.4 F (36.9 C) 98.7 F (37.1 C)  TempSrc: Oral Oral Oral Oral  SpO2: 100% 100% 100% 99%  Weight: 51.6 kg (113 lb 12.1 oz)     Height:       Physical Exam General: NAD, sleepy/difficult to have conversation today. + facial edema Heart: RRR; no murmur Lungs: CTA anteriorly with poor inspiratory effort Extremities: No LE edema Dialysis Access: L AVF + thrill  Additional Objective Labs: Basic Metabolic Panel:  Recent Labs Lab 09/17/16 2214 09/18/16 1744 09/19/16 0411  NA 138 137 137  K 4.1 4.5 3.6  CL 98* 101 98*  CO2 29 22 28   GLUCOSE 110* 117* 97  BUN 36* 53* 17  CREATININE 5.68* 6.68* 3.43*  CALCIUM 8.3* 7.6* 7.8*  PHOS 4.8* 6.8*  --    Liver Function Tests:  Recent Labs Lab 09/17/16 2214 09/18/16 1744 09/19/16 0411  AST  --   --  48*  ALT  --   --  17  ALKPHOS  --   --  84  BILITOT  --   --  0.9  PROT  --   --  5.3*  ALBUMIN 2.8* 2.6* 2.8*   CBC:  Recent Labs Lab 09/16/16 1419 09/17/16 0610 09/17/16 2214 09/18/16 1743 09/19/16 0411  WBC 6.5 7.0 6.4 8.0 6.5  HGB 11.2* 10.4* 10.4* 8.4* 8.2*  HCT 34.8* 32.5* 33.2* 26.4* 25.5*  MCV 89.2 91.3 91.5 89.2 90.1  PLT 231 213 229 182 142*   Cardiac Enzymes:  Recent Labs Lab 09/16/16 1656  TROPONINI 0.05*   CBG:  Recent Labs Lab 09/17/16 2157 09/18/16 0548 09/18/16 2059 09/19/16 0022 09/19/16 0628  GLUCAP 121* 98 80 115* 130*   Studies/Results: Pelvis Portable  Result Date: 09/18/2016 CLINICAL DATA:  LEFT total hip replacement.  EXAM: PORTABLE PELVIS 1-2 VIEWS COMPARISON:  Radiograph 09/16/2016 FINDINGS: Interval LEFT hip total arthroplasty. No fracture dislocation. Surgical skin staples noted. Vascular calcifications noted. IMPRESSION: No complication following LEFT hip arthroplasty. Electronically Signed   By: Suzy Bouchard M.D.   On: 09/18/2016 11:20   Dg C-arm 61-120 Min  Result Date: 09/18/2016 CLINICAL DATA:  83 year old female with left hip fracture fixation EXAM: OPERATIVE LEFT HIP (WITH PELVIS IF PERFORMED) 2 VIEWS TECHNIQUE: Fluoroscopic spot image(s) were submitted for interpretation post-operatively. COMPARISON:  09/16/2016 FINDINGS: Intraoperative fluoroscopic spot images of left hip fracture repair. The acetabular and femoral components appear congruent, with single or cerclage wire at the proximal aspect of the left femur/ femoral stem. Gas within the soft tissues. IMPRESSION: Intraoperative images of left hip arthroplasty for fracture repair, with no immediate complicating features. Please refer to the dictated operative report for full details of intraoperative findings and procedure. Electronically Signed   By: Corrie Mckusick D.O.   On: 09/18/2016 10:54   Dg Hip Operative Unilat W Or W/o Pelvis Left  Result Date: 09/18/2016 CLINICAL DATA:  83 year old female with left hip fracture fixation EXAM: OPERATIVE LEFT HIP (WITH PELVIS IF PERFORMED)  2 VIEWS TECHNIQUE: Fluoroscopic spot image(s) were submitted for interpretation post-operatively. COMPARISON:  09/16/2016 FINDINGS: Intraoperative fluoroscopic spot images of left hip fracture repair. The acetabular and femoral components appear congruent, with single or cerclage wire at the proximal aspect of the left femur/ femoral stem. Gas within the soft tissues. IMPRESSION: Intraoperative images of left hip arthroplasty for fracture repair, with no immediate complicating features. Please refer to the dictated operative report for full details of intraoperative findings  and procedure. Electronically Signed   By: Corrie Mckusick D.O.   On: 09/18/2016 10:54   Medications: . sodium chloride    . sodium chloride 50 mL/hr at 09/18/16 1131  . cefTRIAXone (ROCEPHIN)  IV 1 g (09/19/16 0909)  . ferric gluconate (FERRLECIT/NULECIT) IV Stopped (09/18/16 2039)   . amLODipine  5 mg Oral QHS  . aspirin EC  325 mg Oral Q breakfast  . atorvastatin  80 mg Oral QHS  . calcitRIOL  0.25 mcg Oral Q T,Th,Sa-HD  . calcium carbonate  3,125 mg Oral TID WC  . feeding supplement (PRO-STAT SUGAR FREE 64)  30 mL Oral BID  . heparin  5,000 Units Subcutaneous Q8H  . insulin aspart  0-9 Units Subcutaneous TID WC  . metoprolol tartrate  12.5 mg Oral BID  .  morphine injection  4 mg Intravenous Once  . multivitamin  1 tablet Oral QHS  . ondansetron (ZOFRAN) IV  4 mg Intravenous Once  . pantoprazole  40 mg Oral BID    Dialysis Orders: [place], T,Th,S 3 hrs, 160 NRe 350/600, 49.5 kg, 2.0 K/2.0 Ca, Linear Na/UF Profile 4, No Heparin -Mircera 50 mcg IV Q 4 weeks (last dose 09/11/16 HGB 10.5 09/14/16 ) -Venofer 100 mg IV X 10 doses (has not been started yet-Fe 71 Tsat 27% Ferritin 169 09/08/16) -Calcitriol 0.25 mcg PO TIW (Last PTH 197 09/08/16)  BMD meds: TUMS 3000 mg PO TID AC (Last Ca 9.4 C Ca 9.6 phos 4.2 09/08/16)  Assessment/Plan: 1. L Hip Fracture (s/p L total hip arthroplasty 6/23): + mild L femur pain, per orthopedics. 2. ESRD: Usually TTS schedule. Minimal UF with HD on 6/23 2/2 hypotension. Oxygen requirement higher today, but denies overt dyspnea. Will watch closely, she may need an extra HD tomorrow. 3. Hypertension/volume: BP controlled, on metoprolol and amlodipine. Vascular congestion noted on CXR 6/21. As above, may need extra HD on 6/25. 4. Anemia: Hgb 11.2 -> 10.4 -> 8.2 post-op. S/p IV iron on 6/21, 6/23. Starting Aranesp 100 with next HD.   5. Metabolic bone disease: Ca ok, Phos slightly high. Continue Tums as binder, calcitriol. 6. Nutrition -  Renal diet, renal vit/nepro 7. Hx multi-vessel CAD: No chest pain reported. Was considered not a candidate for CABG in past  8. Hx GI bleed: HGB stable, no recent issues.   Jill Penton, PA-C 09/19/2016, 10:07 AM  [facility] Pager: [phone]

## 2016-09-20 DIAGNOSIS — D500 Iron deficiency anemia secondary to blood loss (chronic): Secondary | ICD-10-CM

## 2016-09-20 LAB — CBC
HCT: 18.2 % — ABNORMAL LOW (ref 36.0–46.0)
Hemoglobin: 5.7 g/dL — CL (ref 12.0–15.0)
MCH: 28.6 pg (ref 26.0–34.0)
MCHC: 31.3 g/dL (ref 30.0–36.0)
MCV: 91.5 fL (ref 78.0–100.0)
PLATELETS: 138 10*3/uL — AB (ref 150–400)
RBC: 1.99 MIL/uL — ABNORMAL LOW (ref 3.87–5.11)
RDW: 20.9 % — AB (ref 11.5–15.5)
WBC: 9 10*3/uL (ref 4.0–10.5)

## 2016-09-20 LAB — GLUCOSE, CAPILLARY
GLUCOSE-CAPILLARY: 180 mg/dL — AB (ref 65–99)
Glucose-Capillary: 143 mg/dL — ABNORMAL HIGH (ref 65–99)
Glucose-Capillary: 122 mg/dL — ABNORMAL HIGH (ref 65–99)

## 2016-09-20 LAB — COMPREHENSIVE METABOLIC PANEL
ALBUMIN: 2.3 g/dL — AB (ref 3.5–5.0)
ALT: <5 U/L — ABNORMAL LOW (ref 14–54)
AST: 62 U/L — AB (ref 15–41)
Alkaline Phosphatase: 89 U/L (ref 38–126)
Anion gap: 11 (ref 5–15)
BUN: 39 mg/dL — ABNORMAL HIGH (ref 6–20)
CHLORIDE: 96 mmol/L — AB (ref 101–111)
CO2: 28 mmol/L (ref 22–32)
Calcium: 7.6 mg/dL — ABNORMAL LOW (ref 8.9–10.3)
Creatinine, Ser: 5.3 mg/dL — ABNORMAL HIGH (ref 0.44–1.00)
GFR calc Af Amer: 8 mL/min — ABNORMAL LOW (ref >60)
GFR, EST NON AFRICAN AMERICAN: 7 mL/min — AB (ref >60)
Glucose, Bld: 150 mg/dL — ABNORMAL HIGH (ref 65–99)
POTASSIUM: 4.9 mmol/L (ref 3.5–5.1)
Sodium: 135 mmol/L (ref 135–145)
Total Bilirubin: 0.7 mg/dL (ref 0.3–1.2)
Total Protein: 4.8 g/dL — ABNORMAL LOW (ref 6.5–8.1)

## 2016-09-20 LAB — PREPARE RBC (CROSSMATCH)

## 2016-09-20 MED ORDER — LIDOCAINE HCL (PF) 1 % IJ SOLN: 5.0000 mL | INTRAMUSCULAR | Status: DC | PRN

## 2016-09-20 MED ORDER — SODIUM CHLORIDE 0.9 % IV SOLN: 100.0000 mL | INTRAVENOUS | Status: DC | PRN

## 2016-09-20 MED ORDER — HEPARIN SODIUM (PORCINE) 1000 UNIT/ML DIALYSIS: 1000.0000 [IU] | INTRAMUSCULAR | Status: DC | PRN

## 2016-09-20 MED ORDER — LIDOCAINE-PRILOCAINE 2.5-2.5 % EX CREA: 1.0000 "application " | TOPICAL_CREAM | CUTANEOUS | Status: DC | PRN

## 2016-09-20 MED ORDER — PENTAFLUOROPROP-TETRAFLUOROETH EX AERO: 1.0000 "application " | INHALATION_SPRAY | CUTANEOUS | Status: DC | PRN

## 2016-09-20 MED ORDER — ALTEPLASE 2 MG IJ SOLR: 2.0000 mg | Freq: Once | INTRAMUSCULAR | Status: DC | PRN

## 2016-09-20 MED ORDER — SODIUM CHLORIDE 0.9 % IV SOLN: Freq: Once | INTRAVENOUS | Status: DC

## 2016-09-20 MED ORDER — FUROSEMIDE 10 MG/ML IJ SOLN: 20.0000 mg | Freq: Once | INTRAMUSCULAR | Status: DC

## 2016-09-20 NOTE — Progress Notes (Signed)
Patient ID: [name], female   DOB: 11-19-1931, 83 y.o.   MRN: [id number] Slow mobility with therapy.  Skilled nursing has been recommended. Hgb down to 5.7, so will transfuse 2 units of packed red blood cells.  Left hip otherwise stable.

## 2016-09-20 NOTE — Progress Notes (Signed)
CRITICAL VALUE ALERT  Critical Value:  Hemoglobin 5.7  Date & Time Notied:  09/20/2016 @ 0538  Provider Notified: Dr. Ninfa Linden  Orders Received/Actions taken: 2 units PRBC's with 20 mg of Lasix between the 2 units.

## 2016-09-20 NOTE — Progress Notes (Signed)
[facility] PROGRESS NOTE  [name] EPP:[id number] DOB: 1931/05/18 DOA: 09/16/2016   PCP: [name], MD     Assessment/Plan: Active Problems:   Hyperlipidemia   Hypertension   Chronic Facial droop   ESRD on dialysis ([name])   GERD (gastroesophageal reflux disease)   Mood disorder (HCC)   Systolic murmur   Imbalance   GAVE (gastric antral vascular ectasia)   Exertional dyspnea   CAD-severe multivessel, not CABG candidate   Forehead laceration   Chronic respiratory failure with hypoxia, on home O2 therapy (HCC)   Iron deficiency anemia due to chronic blood loss   S/p left hip fracture   Hyperglycemia   Hip fracture (HCC)   Closed fracture of neck of left femur (HCC)   Congestive heart failure (CHF) ([name])   Protein-calorie malnutrition ([name])    83 y.o. female with  Extensive medical history   CAD that is medically managed, ESRD on HD ([name]), HTN, HLD, pulmonary hypertension, and anemia. She presented to [name] with left hip pain and was found to have a left femoral neck fracture. Cardiology consult obtained for preop clearance  Assessment and plan Left fem neck Fracture     status post hemiarthroplasty of the left, by Dr. Erlinda Hong. 6/23   Telemetry uneventful, Cardiology consulted for preop clearance and deemed to be high risk for her age and comorbidities, severe three-vessel coronary artery disease, not a candidate for PCI or CABG PT/OT -SNF  Currently on heparin for DVT prophylaxis  ESRD on HD    Tuesday Thursday Saturday  Primary Nephrologist Dr. Blanchard [facility]  notified that the patient may need HD today due to transfusion of blood     Multivessel CAD    last cardiac catheterization 03/2015 on medical RX . Felt not to be a candidate for PCI or bypass surgery Continue ASA,  after surgery  high dose statin, beta blockers most recent 2-D echo 07/15/16, EF 65-70%, shows pulmonary hypertension  Hypertension  Blood pressure has been running  soft, will hold all antihypertensive medications for systolic less than 606 Continue home anti-hypertensive medications after HD     Hyperlipidemia Continue home statins  GERD, no acute symptoms Continue PPI  Anemia of chronic disease/acute blood loss anemia  Hemoglobin on admission  11.2 at baseline . Dropped to 5.7 Ordered to receive 2 units of packed red blood cells, notified Dr. Jonnie Finner, that she may need urgent dialysis Receives Aranesp as OP    Hyperglycemia, patient denies H/O DM, current glu 119  Hemoglobin A1c 6.0, continue Accu-Cheks, continue sliding scale insulin  Chronic respiratory failure with hypoxia, wears 2 L home O2 prn, no diagnosis of COPD. NO Tobacco., NAD. CXR without infiltrate  O2 in the low 90s in RA, continue oxygen via [place],    UTI Patient initiated on Rocephin,  DC Rocephin 6/27    DVT prophylaxsis heparin  Code Status:  Full code   Family Communication: Discussed in detail with the patient, all imaging results, lab results explained to the patient   Disposition Plan:  Likely needs SNF once stable      Consultants:  Orthopedics  Nephrology  Procedures:  None  Antibiotics: Anti-infectives    Start     Dose/Rate Route Frequency Ordered Stop   09/18/16 1930  ceFAZolin (ANCEF) IVPB 2g/100 mL premix     2 g 200 mL/hr over 30 Minutes Intravenous Every 12 hours 09/18/16 1119 09/19/16 0641   09/17/16 1000  ceFAZolin (ANCEF) IVPB  2g/100 mL premix     2 g 200 mL/hr over 30 Minutes Intravenous To [facility] 09/17/16 0955 09/18/16 0735   09/17/16 1000  cefTRIAXone (ROCEPHIN) 1 g in dextrose 5 % 50 mL IVPB     1 g 100 mL/hr over 30 Minutes Intravenous Every 24 hours 09/17/16 0945           HPI/Subjective:  Patient somnolent but arousable,  Objective: Vitals:   09/19/16 1929 09/19/16 2152 09/20/16 0350 09/20/16 0753  BP: (!) 96/30 (!) 104/33 (!) 92/36 (!) 101/41  Pulse: 74 70 76 73  Resp: 16 16 16 17   Temp: 98.3 F  (36.8 C) 97.8 F (36.6 C) 98.2 F (36.8 C) 97.8 F (36.6 C)  TempSrc: Oral Oral Oral Oral  SpO2: 100% 100% 100% 100%  Weight:      Height:        Intake/Output Summary (Last 24 hours) at 09/20/16 4742 Last data filed at 09/19/16 1700  Gross per 24 hour  Intake              440 ml  Output                0 ml  Net              440 ml    Exam:  Examination:  General exam: Appears calm and comfortable  Respiratory system: Clear to auscultation. Respiratory effort normal. Cardiovascular system: S1 & S2 heard, RRR. No JVD, murmurs, rubs, gallops or clicks. No pedal edema. Gastrointestinal system: Abdomen is nondistended, soft and nontender. No organomegaly or masses felt. Normal bowel sounds heard. Central nervous system: Alert and oriented. No focal neurological deficits. Extremities: Symmetric 5 x 5 power. Skin: No rashes, lesions or ulcers Psychiatry: Judgement and insight appear normal. Mood & affect appropriate.     Data Reviewed: I have personally reviewed following labs and imaging studies  Micro Results Recent Results (from the past 240 hour(s))  Surgical pcr screen     Status: None   Collection Time: 09/17/16  2:04 AM  Result Value Ref Range Status   MRSA, PCR NEGATIVE NEGATIVE Final   Staphylococcus aureus NEGATIVE NEGATIVE Final    Comment:        The Xpert SA Assay (FDA approved for NASAL specimens in patients over 53 years of age), is one component of a comprehensive surveillance program.  Test performance has been validated by [facility] for patients greater than or equal to 74 year old. It is not intended to diagnose infection nor to guide or monitor treatment.     Radiology Reports Pelvis Portable  Result Date: 09/18/2016 CLINICAL DATA:  LEFT total hip replacement. EXAM: PORTABLE PELVIS 1-2 VIEWS COMPARISON:  Radiograph 09/16/2016 FINDINGS: Interval LEFT hip total arthroplasty. No fracture dislocation. Surgical skin staples noted. Vascular  calcifications noted. IMPRESSION: No complication following LEFT hip arthroplasty. Electronically Signed   By: Suzy Bouchard M.D.   On: 09/18/2016 11:20   Dg Chest Port 1 View  Result Date: 09/16/2016 CLINICAL DATA:  Preop for left hip fracture repair. EXAM: PORTABLE CHEST 1 VIEW COMPARISON:  Radiographs of June 16, 2016. FINDINGS: Stable cardiomegaly and central pulmonary vascular congestion. Atherosclerosis of thoracic aorta is noted. No pneumothorax or pleural effusion is noted. Stable elevated right hemidiaphragm is noted. No consolidative process is noted. Bony thorax is unremarkable. IMPRESSION: Stable cardiomegaly and central pulmonary vascular congestion. Aortic atherosclerosis. No consolidative process is noted. Electronically Signed   By: Marijo Conception,  M.D.   On: 09/16/2016 14:17   Dg C-arm 61-120 Min  Result Date: 09/18/2016 CLINICAL DATA:  83 year old female with left hip fracture fixation EXAM: OPERATIVE LEFT HIP (WITH PELVIS IF PERFORMED) 2 VIEWS TECHNIQUE: Fluoroscopic spot image(s) were submitted for interpretation post-operatively. COMPARISON:  09/16/2016 FINDINGS: Intraoperative fluoroscopic spot images of left hip fracture repair. The acetabular and femoral components appear congruent, with single or cerclage wire at the proximal aspect of the left femur/ femoral stem. Gas within the soft tissues. IMPRESSION: Intraoperative images of left hip arthroplasty for fracture repair, with no immediate complicating features. Please refer to the dictated operative report for full details of intraoperative findings and procedure. Electronically Signed   By: Corrie Mckusick D.O.   On: 09/18/2016 10:54   Dg Hip Operative Unilat W Or W/o Pelvis Left  Result Date: 09/18/2016 CLINICAL DATA:  83 year old female with left hip fracture fixation EXAM: OPERATIVE LEFT HIP (WITH PELVIS IF PERFORMED) 2 VIEWS TECHNIQUE: Fluoroscopic spot image(s) were submitted for interpretation post-operatively.  COMPARISON:  09/16/2016 FINDINGS: Intraoperative fluoroscopic spot images of left hip fracture repair. The acetabular and femoral components appear congruent, with single or cerclage wire at the proximal aspect of the left femur/ femoral stem. Gas within the soft tissues. IMPRESSION: Intraoperative images of left hip arthroplasty for fracture repair, with no immediate complicating features. Please refer to the dictated operative report for full details of intraoperative findings and procedure. Electronically Signed   By: Corrie Mckusick D.O.   On: 09/18/2016 10:54   Dg Hip Unilat W Or W/o Pelvis 2-3 Views Left  Result Date: 09/16/2016 CLINICAL DATA:  83 year old female with 6 weeks of left hip pain, no known injury. EXAM: DG HIP (WITH OR WITHOUT PELVIS) 2-3V LEFT COMPARISON:  PET-CT 07/26/2005 FINDINGS: Aortoiliac calcified atherosclerosis. Bilateral calcified femoral artery atherosclerosis. Osteopenia. The pelvis appears intact. Grossly intact proximal right femur. The left femoral head is normally located. There is a left femoral neck fracture with mild varus angulation, no other displacement. The proximal left femur intertrochanteric segment appears to remain intact. Proximal left femoral shaft is intact. IMPRESSION: 1. Left femoral neck fracture with mild varus angulation appears acute versus subacute. 2. Osteopenia.  No other acute osseous abnormality identified. 3. Calcified aortic and iliofemoral atherosclerosis. Electronically Signed   By: Genevie Ann M.D.   On: 09/16/2016 12:50     CBC  Recent Labs Lab 09/17/16 0610 09/17/16 2214 09/18/16 1743 09/19/16 0411 09/20/16 0427  WBC 7.0 6.4 8.0 6.5 9.0  HGB 10.4* 10.4* 8.4* 8.2* 5.7*  HCT 32.5* 33.2* 26.4* 25.5* 18.2*  PLT 213 229 182 142* 138*  MCV 91.3 91.5 89.2 90.1 91.5  MCH 29.2 28.7 28.4 29.0 28.6  MCHC 32.0 31.3 31.8 32.2 31.3  RDW 19.3* 19.5* 19.5* 20.2* 20.9*    Chemistries   Recent Labs Lab 09/17/16 0610 09/17/16 2214  09/18/16 1744 09/19/16 0411 09/20/16 0427  NA 139 138 137 137 135  K 4.1 4.1 4.5 3.6 4.9  CL 98* 98* 101 98* 96*  CO2 29 29 22 28 28   GLUCOSE 108* 110* 117* 97 150*  BUN 25* 36* 53* 17 39*  CREATININE 4.19* 5.68* 6.68* 3.43* 5.30*  CALCIUM 8.3* 8.3* 7.6* 7.8* 7.6*  AST  --   --   --  48* 62*  ALT  --   --   --  17 <5*  ALKPHOS  --   --   --  84 89  BILITOT  --   --   --  0.9 0.7   ------------------------------------------------------------------------------------------------------------------ estimated creatinine clearance is 6.5 mL/min (A) (by C-G formula based on SCr of 5.3 mg/dL (H)). ------------------------------------------------------------------------------------------------------------------ No results for input(s): HGBA1C in the last 72 hours. ------------------------------------------------------------------------------------------------------------------ No results for input(s): CHOL, HDL, LDLCALC, TRIG, CHOLHDL, LDLDIRECT in the last 72 hours. ------------------------------------------------------------------------------------------------------------------ No results for input(s): TSH, T4TOTAL, T3FREE, THYROIDAB in the last 72 hours.  Invalid input(s): FREET3 ------------------------------------------------------------------------------------------------------------------ No results for input(s): VITAMINB12, FOLATE, FERRITIN, TIBC, IRON, RETICCTPCT in the last 72 hours.  Coagulation profile  Recent Labs Lab 09/16/16 1419 09/17/16 0610  INR 1.03 1.07    No results for input(s): DDIMER in the last 72 hours.  Cardiac Enzymes  Recent Labs Lab 09/16/16 1656  TROPONINI 0.05*   ------------------------------------------------------------------------------------------------------------------ Invalid input(s): POCBNP   CBG:  Recent Labs Lab 09/19/16 0628 09/19/16 1217 09/19/16 1639 09/19/16 2155 09/20/16 0558  GLUCAP 130* 241* 246* 183* 143*        Studies: Pelvis Portable  Result Date: 09/18/2016 CLINICAL DATA:  LEFT total hip replacement. EXAM: PORTABLE PELVIS 1-2 VIEWS COMPARISON:  Radiograph 09/16/2016 FINDINGS: Interval LEFT hip total arthroplasty. No fracture dislocation. Surgical skin staples noted. Vascular calcifications noted. IMPRESSION: No complication following LEFT hip arthroplasty. Electronically Signed   By: Suzy Bouchard M.D.   On: 09/18/2016 11:20   Dg C-arm 61-120 Min  Result Date: 09/18/2016 CLINICAL DATA:  83 year old female with left hip fracture fixation EXAM: OPERATIVE LEFT HIP (WITH PELVIS IF PERFORMED) 2 VIEWS TECHNIQUE: Fluoroscopic spot image(s) were submitted for interpretation post-operatively. COMPARISON:  09/16/2016 FINDINGS: Intraoperative fluoroscopic spot images of left hip fracture repair. The acetabular and femoral components appear congruent, with single or cerclage wire at the proximal aspect of the left femur/ femoral stem. Gas within the soft tissues. IMPRESSION: Intraoperative images of left hip arthroplasty for fracture repair, with no immediate complicating features. Please refer to the dictated operative report for full details of intraoperative findings and procedure. Electronically Signed   By: Corrie Mckusick D.O.   On: 09/18/2016 10:54   Dg Hip Operative Unilat W Or W/o Pelvis Left  Result Date: 09/18/2016 CLINICAL DATA:  83 year old female with left hip fracture fixation EXAM: OPERATIVE LEFT HIP (WITH PELVIS IF PERFORMED) 2 VIEWS TECHNIQUE: Fluoroscopic spot image(s) were submitted for interpretation post-operatively. COMPARISON:  09/16/2016 FINDINGS: Intraoperative fluoroscopic spot images of left hip fracture repair. The acetabular and femoral components appear congruent, with single or cerclage wire at the proximal aspect of the left femur/ femoral stem. Gas within the soft tissues. IMPRESSION: Intraoperative images of left hip arthroplasty for fracture repair, with no immediate  complicating features. Please refer to the dictated operative report for full details of intraoperative findings and procedure. Electronically Signed   By: Corrie Mckusick D.O.   On: 09/18/2016 10:54      Lab Results  Component Value Date   HGBA1C 6.0 (H) 09/16/2016   HGBA1C 5.4 09/23/2014   HGBA1C 4.8 04/09/2014   Lab Results  Component Value Date   LDLCALC 42 06/19/2015   CREATININE 5.30 (H) 09/20/2016       Scheduled Meds: . aspirin EC  325 mg Oral Q breakfast  . atorvastatin  80 mg Oral QHS  . calcitRIOL  0.25 mcg Oral Q T,Th,Sa-HD  . calcium carbonate  3,125 mg Oral TID WC  . [START ON 09/21/2016] darbepoetin (ARANESP) injection - DIALYSIS  100 mcg Intravenous Q Tue-HD  . feeding supplement (PRO-STAT SUGAR FREE 64)  30 mL Oral BID  . furosemide  20 mg Intravenous Once  .  heparin  5,000 Units Subcutaneous Q8H  . insulin aspart  0-9 Units Subcutaneous TID WC  . metoprolol tartrate  12.5 mg Oral BID  .  morphine injection  4 mg Intravenous Once  . multivitamin  1 tablet Oral QHS  . ondansetron (ZOFRAN) IV  4 mg Intravenous Once  . pantoprazole  40 mg Oral BID   Continuous Infusions: . sodium chloride    . sodium chloride 50 mL/hr at 09/18/16 1131  . sodium chloride    . cefTRIAXone (ROCEPHIN)  IV Stopped (09/19/16 0939)  . ferric gluconate (FERRLECIT/NULECIT) IV Stopped (09/18/16 2039)     LOS: 4 days    Time spent: >30 MINS    Reyne Dumas  [facility] Pager [phone]. If 7PM-7AM, please contact night-coverage at [email or web address], password [facility] 09/20/2016, 9:24 AM  LOS: 4 days

## 2016-09-20 NOTE — Progress Notes (Signed)
Subjective:  Stable no c/o, Hb down to 5.1 today, no bleeding noted per pt  Objective Vital signs in last 24 hours: Vitals:   09/19/16 1929 09/19/16 2152 09/20/16 0350 09/20/16 0753  BP: (!) 96/30 (!) 104/33 (!) 92/36 (!) 101/41  Pulse: 74 70 76 73  Resp: 16 16 16 17   Temp: 98.3 F (36.8 C) 97.8 F (36.6 C) 98.2 F (36.8 C) 97.8 F (36.6 C)  TempSrc: Oral Oral Oral Oral  SpO2: 100% 100% 100% 100%  Weight:      Height:       Weight change:   Intake/Output Summary (Last 24 hours) at 09/20/16 0948 Last data filed at 09/19/16 1700  Gross per 24 hour  Intake              440 ml  Output                0 ml  Net              440 ml    Dialysis:  [facility] 3h  F160  2/2 bath  49.5kg   P4   Hep none -Mircera 50 mcg IV Q 4 weeks (last dose 09/11/16 HGB 10.5 09/14/16 ) -Venofer 100 mg IV X 10 doses (has not been started yet-Fe 71 Tsat 27% Ferritin 169 09/08/16) -Calcitriol 0.25 mcg PO TIW (Last PTH 197 09/08/16)  BMD meds: TUMS 3000 mg PO TID AC (Last Ca 9.4 C Ca 9.6 phos 4.2 09/08/16)  Assessment/Plan: 1. L Hip Fracture: Orthopedic consult. S/p L hip arthroplasty 6/23 2. Anemia of ABL/ CKD - need prbc's, will give today w HD.  Recent OP ESA dose. Follow HGB giving nulecit and gave dose of darbe  3. ESRD -  usual HD TTS. Plan HD today off sched for transfusion 4. Hypertension/volume  -   5. Metabolic bone disease -  Cont oscal, rocaltrol 6. Nutrition - Renal diet, renal vit/nepro 7. H/O multi-vessel CAD: No chest pain reported. Was considered not a candidate for CABG in past  8. H/O GI bleed:  no recent issues. hgb now down likely due to surgery    Kelly Splinter MD [facility] pgr [phone]   09/20/2016, 9:57 AM     Labs: Basic Metabolic Panel:  Recent Labs Lab 09/17/16 2214 09/18/16 1744 09/19/16 0411 09/20/16 0427  NA 138 137 137 135  K 4.1 4.5 3.6 4.9  CL 98* 101 98* 96*  CO2 29 22 28 28   GLUCOSE 110* 117* 97 150*  BUN 36* 53* 17  39*  CREATININE 5.68* 6.68* 3.43* 5.30*  CALCIUM 8.3* 7.6* 7.8* 7.6*  PHOS 4.8* 6.8*  --   --    Liver Function Tests:  Recent Labs Lab 09/18/16 1744 09/19/16 0411 09/20/16 0427  AST  --  48* 62*  ALT  --  17 <5*  ALKPHOS  --  84 89  BILITOT  --  0.9 0.7  PROT  --  5.3* 4.8*  ALBUMIN 2.6* 2.8* 2.3*   No results for input(s): LIPASE, AMYLASE in the last 168 hours. No results for input(s): AMMONIA in the last 168 hours. CBC:  Recent Labs Lab 09/17/16 0610 09/17/16 2214 09/18/16 1743 09/19/16 0411 09/20/16 0427  WBC 7.0 6.4 8.0 6.5 9.0  HGB 10.4* 10.4* 8.4* 8.2* 5.7*  HCT 32.5* 33.2* 26.4* 25.5* 18.2*  MCV 91.3 91.5 89.2 90.1 91.5  PLT 213 229 182 142* 138*   Cardiac Enzymes:  Recent Labs Lab 09/16/16  1656  TROPONINI 0.05*   CBG:  Recent Labs Lab 09/19/16 0628 09/19/16 1217 09/19/16 1639 09/19/16 2155 09/20/16 0558  GLUCAP 130* 241* 246* 183* 143*    Iron Studies: No results for input(s): IRON, TIBC, TRANSFERRIN, FERRITIN in the last 72 hours. Studies/Results: Pelvis Portable  Result Date: 09/18/2016 CLINICAL DATA:  LEFT total hip replacement. EXAM: PORTABLE PELVIS 1-2 VIEWS COMPARISON:  Radiograph 09/16/2016 FINDINGS: Interval LEFT hip total arthroplasty. No fracture dislocation. Surgical skin staples noted. Vascular calcifications noted. IMPRESSION: No complication following LEFT hip arthroplasty. Electronically Signed   By: Suzy Bouchard M.D.   On: 09/18/2016 11:20   Medications: Infusions: . sodium chloride    . sodium chloride    . cefTRIAXone (ROCEPHIN)  IV Stopped (09/19/16 0939)  . ferric gluconate (FERRLECIT/NULECIT) IV Stopped (09/18/16 2039)    Scheduled Medications: . aspirin EC  325 mg Oral Q breakfast  . atorvastatin  80 mg Oral QHS  . calcitRIOL  0.25 mcg Oral Q T,Th,Sa-HD  . calcium carbonate  3,125 mg Oral TID WC  . [START ON 09/21/2016] darbepoetin (ARANESP) injection - DIALYSIS  100 mcg Intravenous Q Tue-HD  . feeding  supplement (PRO-STAT SUGAR FREE 64)  30 mL Oral BID  . heparin  5,000 Units Subcutaneous Q8H  . insulin aspart  0-9 Units Subcutaneous TID WC  . metoprolol tartrate  12.5 mg Oral BID  .  morphine injection  4 mg Intravenous Once  . multivitamin  1 tablet Oral QHS  . ondansetron (ZOFRAN) IV  4 mg Intravenous Once  . pantoprazole  40 mg Oral BID    have reviewed scheduled and prn medications.  Physical Exam: General: stable, no distress Heart: RRR Lungs: few scattered rhonchi clears with coughing Abdomen: soft, non tender Extremities: no edema, 2+ DP pulses, L thigh mild-mod postop swelling Dialysis Access: left AVF     09/20/2016,9:48 AM  LOS: 4 days

## 2016-09-20 NOTE — Progress Notes (Signed)
PT Cancellation Note  Patient Details Name: [name] MRN: [id number] DOB: 1931-12-22   Cancelled Treatment:    Reason Eval/Treat Not Completed: Patient not medically ready . Pt's HGB is at 5.7. Pt going to go to HD and with get 2 units of blood. Acute PT to return as able when appropriate.  Hillel Card M Tabatha Razzano 09/20/2016, 11:57 AM   Kittie Plater, PT, DPT Pager #: [phone] Office #: [phone]

## 2016-09-21 ENCOUNTER — Ambulatory Visit: Payer: Medicare Other | Admitting: Cardiology

## 2016-09-21 ENCOUNTER — Encounter (HOSPITAL_COMMUNITY): Payer: Self-pay | Admitting: Orthopaedic Surgery

## 2016-09-21 DIAGNOSIS — Z853 Personal history of malignant neoplasm of breast: Secondary | ICD-10-CM | POA: Diagnosis not present

## 2016-09-21 DIAGNOSIS — M8000XD Age-related osteoporosis with current pathological fracture, unspecified site, subsequent encounter for fracture with routine healing: Secondary | ICD-10-CM | POA: Diagnosis not present

## 2016-09-21 DIAGNOSIS — S72002A Fracture of unspecified part of neck of left femur, initial encounter for closed fracture: Secondary | ICD-10-CM | POA: Diagnosis not present

## 2016-09-21 DIAGNOSIS — R0902 Hypoxemia: Secondary | ICD-10-CM | POA: Diagnosis not present

## 2016-09-21 DIAGNOSIS — J9611 Chronic respiratory failure with hypoxia: Secondary | ICD-10-CM | POA: Diagnosis not present

## 2016-09-21 DIAGNOSIS — Z4682 Encounter for fitting and adjustment of non-vascular catheter: Secondary | ICD-10-CM | POA: Diagnosis not present

## 2016-09-21 DIAGNOSIS — N2581 Secondary hyperparathyroidism of renal origin: Secondary | ICD-10-CM | POA: Diagnosis not present

## 2016-09-21 DIAGNOSIS — I10 Essential (primary) hypertension: Secondary | ICD-10-CM | POA: Diagnosis not present

## 2016-09-21 DIAGNOSIS — R001 Bradycardia, unspecified: Secondary | ICD-10-CM | POA: Diagnosis not present

## 2016-09-21 DIAGNOSIS — I5021 Acute systolic (congestive) heart failure: Secondary | ICD-10-CM | POA: Diagnosis not present

## 2016-09-21 DIAGNOSIS — I2510 Atherosclerotic heart disease of native coronary artery without angina pectoris: Secondary | ICD-10-CM | POA: Diagnosis not present

## 2016-09-21 DIAGNOSIS — R404 Transient alteration of awareness: Secondary | ICD-10-CM | POA: Diagnosis not present

## 2016-09-21 DIAGNOSIS — D631 Anemia in chronic kidney disease: Secondary | ICD-10-CM | POA: Diagnosis not present

## 2016-09-21 DIAGNOSIS — I120 Hypertensive chronic kidney disease with stage 5 chronic kidney disease or end stage renal disease: Secondary | ICD-10-CM | POA: Diagnosis not present

## 2016-09-21 DIAGNOSIS — S72009A Fracture of unspecified part of neck of unspecified femur, initial encounter for closed fracture: Secondary | ICD-10-CM | POA: Diagnosis not present

## 2016-09-21 DIAGNOSIS — N186 End stage renal disease: Secondary | ICD-10-CM | POA: Diagnosis not present

## 2016-09-21 DIAGNOSIS — I340 Nonrheumatic mitral (valve) insufficiency: Secondary | ICD-10-CM | POA: Diagnosis not present

## 2016-09-21 DIAGNOSIS — R29810 Facial weakness: Secondary | ICD-10-CM | POA: Diagnosis not present

## 2016-09-21 DIAGNOSIS — Z87891 Personal history of nicotine dependence: Secondary | ICD-10-CM | POA: Diagnosis not present

## 2016-09-21 DIAGNOSIS — I469 Cardiac arrest, cause unspecified: Secondary | ICD-10-CM | POA: Diagnosis not present

## 2016-09-21 DIAGNOSIS — I25118 Atherosclerotic heart disease of native coronary artery with other forms of angina pectoris: Secondary | ICD-10-CM | POA: Diagnosis not present

## 2016-09-21 DIAGNOSIS — Z992 Dependence on renal dialysis: Secondary | ICD-10-CM | POA: Diagnosis not present

## 2016-09-21 DIAGNOSIS — Z9981 Dependence on supplemental oxygen: Secondary | ICD-10-CM | POA: Diagnosis not present

## 2016-09-21 DIAGNOSIS — I959 Hypotension, unspecified: Secondary | ICD-10-CM | POA: Diagnosis not present

## 2016-09-21 DIAGNOSIS — E785 Hyperlipidemia, unspecified: Secondary | ICD-10-CM | POA: Diagnosis not present

## 2016-09-21 DIAGNOSIS — R4182 Altered mental status, unspecified: Secondary | ICD-10-CM | POA: Diagnosis not present

## 2016-09-21 DIAGNOSIS — R55 Syncope and collapse: Secondary | ICD-10-CM | POA: Diagnosis present

## 2016-09-21 DIAGNOSIS — M25559 Pain in unspecified hip: Secondary | ICD-10-CM | POA: Diagnosis not present

## 2016-09-21 DIAGNOSIS — Z96642 Presence of left artificial hip joint: Secondary | ICD-10-CM | POA: Diagnosis not present

## 2016-09-21 DIAGNOSIS — D638 Anemia in other chronic diseases classified elsewhere: Secondary | ICD-10-CM | POA: Diagnosis not present

## 2016-09-21 DIAGNOSIS — N390 Urinary tract infection, site not specified: Secondary | ICD-10-CM | POA: Diagnosis not present

## 2016-09-21 DIAGNOSIS — E1122 Type 2 diabetes mellitus with diabetic chronic kidney disease: Secondary | ICD-10-CM | POA: Diagnosis not present

## 2016-09-21 DIAGNOSIS — K219 Gastro-esophageal reflux disease without esophagitis: Secondary | ICD-10-CM | POA: Diagnosis not present

## 2016-09-21 DIAGNOSIS — Z471 Aftercare following joint replacement surgery: Secondary | ICD-10-CM | POA: Diagnosis not present

## 2016-09-21 LAB — CBC
HEMATOCRIT: 24.6 % — AB (ref 36.0–46.0)
Hemoglobin: 8.2 g/dL — ABNORMAL LOW (ref 12.0–15.0)
MCH: 28.2 pg (ref 26.0–34.0)
MCHC: 33.3 g/dL (ref 30.0–36.0)
MCV: 84.5 fL (ref 78.0–100.0)
Platelets: 136 10*3/uL — ABNORMAL LOW (ref 150–400)
RBC: 2.91 MIL/uL — ABNORMAL LOW (ref 3.87–5.11)
RDW: 20 % — ABNORMAL HIGH (ref 11.5–15.5)
WBC: 11 10*3/uL — ABNORMAL HIGH (ref 4.0–10.5)

## 2016-09-21 LAB — BASIC METABOLIC PANEL
Anion gap: 13 (ref 5–15)
BUN: 28 mg/dL — AB (ref 6–20)
CALCIUM: 7.6 mg/dL — AB (ref 8.9–10.3)
CO2: 24 mmol/L (ref 22–32)
CREATININE: 3.9 mg/dL — AB (ref 0.44–1.00)
Chloride: 99 mmol/L — ABNORMAL LOW (ref 101–111)
GFR calc non Af Amer: 10 mL/min — ABNORMAL LOW (ref >60)
GFR, EST AFRICAN AMERICAN: 11 mL/min — AB (ref >60)
Glucose, Bld: 124 mg/dL — ABNORMAL HIGH (ref 65–99)
Potassium: 3.8 mmol/L (ref 3.5–5.1)
SODIUM: 136 mmol/L (ref 135–145)

## 2016-09-21 LAB — TYPE AND SCREEN
ABO/RH(D): O POS
Antibody Screen: NEGATIVE
UNIT DIVISION: 0
Unit division: 0

## 2016-09-21 LAB — BPAM RBC
BLOOD PRODUCT EXPIRATION DATE: 201807022359
Blood Product Expiration Date: 201807022359
ISSUE DATE / TIME: 201806251428
ISSUE DATE / TIME: 201806251428
UNIT TYPE AND RH: 5100
UNIT TYPE AND RH: 5100

## 2016-09-21 LAB — GLUCOSE, CAPILLARY
GLUCOSE-CAPILLARY: 136 mg/dL — AB (ref 65–99)
Glucose-Capillary: 124 mg/dL — ABNORMAL HIGH (ref 65–99)

## 2016-09-21 MED ORDER — METOCLOPRAMIDE HCL 5 MG PO TABS: 5.0000 mg | ORAL_TABLET | Freq: Three times a day (TID) | ORAL | 0 refills | Status: AC | PRN

## 2016-09-21 MED ORDER — SENNOSIDES-DOCUSATE SODIUM 8.6-50 MG PO TABS: 1.0000 | ORAL_TABLET | Freq: Every evening | ORAL | 1 refills | Status: AC | PRN

## 2016-09-21 MED ORDER — CEFPODOXIME PROXETIL 200 MG PO TABS: 200.0000 mg | ORAL_TABLET | Freq: Two times a day (BID) | ORAL | 0 refills | Status: AC

## 2016-09-21 MED ORDER — HEPARIN SODIUM (PORCINE) 5000 UNIT/ML IJ SOLN: 5000.0000 [IU] | Freq: Three times a day (TID) | INTRAMUSCULAR | 0 refills | Status: AC

## 2016-09-21 MED ORDER — PRO-STAT SUGAR FREE PO LIQD
30.0000 mL | Freq: Two times a day (BID) | ORAL | 0 refills | Status: AC
Start: 2016-09-21 — End: ?

## 2016-09-21 MED ORDER — BISACODYL 5 MG PO TBEC: 5.0000 mg | DELAYED_RELEASE_TABLET | Freq: Every day | ORAL | 0 refills | Status: AC | PRN

## 2016-09-21 NOTE — Clinical Social Work Note (Signed)
Clinical Social Work Assessment  Patient Details  Name: [name] MRN: [id number] Date of Birth: October 08, 1931  Date of referral:  09/21/16               Reason for consult:  Facility Placement                Permission sought to share information with:    Permission granted to share information::  Yes, Verbal Permission Granted  Name::        Agency::  SNF  Relationship::     Contact Information:     Housing/Transportation Living arrangements for the past 2 months:  Single Family Home Source of Information:  Patient Patient Interpreter Needed:  None Criminal Activity/Legal Involvement Pertinent to Current Situation/Hospitalization:  No - Comment as needed Significant Relationships:  Other Family Members Lives with:  Self Do you feel safe going back to the place where you live?  No Need for family participation in patient care:  No (Coment)  Care giving concerns:  Patient resided alone prior to hospitalization and was independent with ADL's. Pt indicated that she is responsible for self and did not need to provide family information . Pt not safe to return home at this time.   Social Worker assessment / plan:  CSW met with patient at bedside to discuss clinical teams recommendations for DC to SNF. Patient in agreement. CSW explained her role and SNF options/placement. Pt has experience with SNF in the past and wants to go to [facility] or [facility] [employer]. CSW obtained permission to send out offers to SNF's.  FL2 complete. Passr obtained. Offers sent.   Employment status:  Retired Forensic scientist:  Medicare PT Recommendations:  [place] / Referral to community resources:  [name]  Patient/Family's Response to care:  Patient appreciative of CSW assistance with SNF placement. No issues or concerns at this time.  Patient/Family's Understanding of and Emotional Response to Diagnosis, Current Treatment, and Prognosis:  Pt has  good understanding of diagnosis, current treatment and prognosis. Pt hopeful that rehabilitation will address impairment. No issues or concerns at this time.  Emotional Assessment Appearance:  Appears stated age Attitude/Demeanor/Rapport:   (Cooperative) Affect (typically observed):  Accepting, Appropriate Orientation:  Oriented to Self, Oriented to Place, Oriented to  Time, Oriented to Situation Alcohol / Substance use:  Not Applicable Psych involvement (Current and /or in the community):  No (Comment)  Discharge Needs  Concerns to be addressed:  Care Coordination Readmission within the last 30 days:  No Current discharge risk:  Physical Impairment, Cognitively Impaired Barriers to Discharge:  No Barriers Identified   Normajean Baxter, LCSW 09/21/2016, 10:41 AM

## 2016-09-21 NOTE — Discharge Summary (Signed)
Physician Discharge Summary  [name] MRN: [id number] DOB/AGE: 81-08-1931 81 y.o.  PCP: [name], MD   Admit date: 09/16/2016 Discharge date: 09/21/2016  Discharge Diagnoses:    Active Problems:   Hyperlipidemia   Hypertension   Chronic Facial droop   ESRD on dialysis (HCC)   GERD (gastroesophageal reflux disease)   Mood disorder (HCC)   Systolic murmur   Imbalance   GAVE (gastric antral vascular ectasia)   Exertional dyspnea   CAD-severe multivessel, not CABG candidate   Forehead laceration   Chronic respiratory failure with hypoxia, on home O2 therapy (HCC)   Iron deficiency anemia due to chronic blood loss   S/p left hip fracture   Hyperglycemia   Hip fracture (HCC)   Closed fracture of neck of left femur (HCC)   Congestive heart failure (CHF) (HCC)   Protein-calorie malnutrition (HCC)    Follow-up recommendations Follow-up with PCP in 3-5 days , including all  additional recommended appointments as below Follow-up CBC, CMP in 3-5 days, follow CBC very closely       Current Discharge Medication List    START taking these medications   Details  Amino Acids-Protein Hydrolys (FEEDING SUPPLEMENT, PRO-STAT SUGAR FREE 64,) LIQD Take 30 mLs by mouth 2 (two) times daily. Qty: 900 mL, Refills: 0    bisacodyl (DULCOLAX) 5 MG EC tablet Take 1 tablet (5 mg total) by mouth daily as needed for moderate constipation. Qty: 30 tablet, Refills: 0    heparin 5000 UNIT/ML injection Inject 1 mL (5,000 Units total) into the skin every 8 (eight) hours. Qty: 80 mL, Refills: 0    HYDROcodone-acetaminophen (NORCO/VICODIN) 5-325 MG tablet Take 1-2 tablets by mouth every 4 (four) hours as needed for moderate pain. Qty: 60 tablet, Refills: 0    metoCLOPramide (REGLAN) 5 MG tablet Take 1 tablet (5 mg total) by mouth every 8 (eight) hours as needed for nausea (if ondansetron (ZOFRAN) ineffective.). Qty: 30 tablet, Refills: 0    senna-docusate (SENOKOT-S) 8.6-50 MG  tablet Take 1 tablet by mouth at bedtime as needed for mild constipation. Qty: 30 tablet, Refills: 1      CONTINUE these medications which have NOT CHANGED   Details  acetaminophen (TYLENOL) 500 MG tablet Take 500 mg by mouth daily as needed for moderate pain.     aspirin EC 81 MG tablet Take 1 tablet (81 mg total) by mouth daily. Qty: 90 tablet, Refills: 3   Associated Diagnoses: Essential hypertension    atorvastatin (LIPITOR) 80 MG tablet Take 1 tablet (80 mg total) by mouth at bedtime. Qty: 90 tablet, Refills: 3   Associated Diagnoses: Coronary artery disease involving native coronary artery of native heart without angina pectoris    B Complex-C-Folic Acid (RENA-VITE PO) Take 1 tablet by mouth daily with breakfast.     Calcium Carbonate Antacid (TUMS ULTRA 1000 PO) Take 3,000 mg by mouth 3 (three) times daily after meals.     cholecalciferol (VITAMIN D) 1000 UNITS tablet Take 1,000 Units by mouth every Monday, Wednesday, and Friday.     metoprolol tartrate (LOPRESSOR) 25 MG tablet Take 1 tablet (25 mg total) by mouth 2 (two) times daily. Qty: 180 tablet, Refills: 3   Associated Diagnoses: Coronary artery disease involving native coronary artery of native heart without angina pectoris    NON FORMULARY Oxygen - 2 liters as needed    pantoprazole (PROTONIX) 40 MG tablet TAKE 1 TABLET BY MOUTH TWICE A DAY Qty: 60 tablet, Refills: 5  Polyethyl Glycol-Propyl Glycol (SYSTANE) 0.4-0.3 % GEL ophthalmic gel Place 1 application into the right eye every 6 (six) hours as needed (eye dryness or pain).    ondansetron (ZOFRAN) 4 MG tablet Take 1 tablet (4 mg total) by mouth every 8 (eight) hours as needed for nausea or vomiting. Qty: 20 tablet, Refills: 0    traMADol (ULTRAM) 50 MG tablet Take 0.5 tablets (25 mg total) by mouth 2 (two) times daily as needed for moderate pain. Qty: 20 tablet, Refills: 0  Cefpodoxime 200 mg twice a day 3 days     STOP taking these medications      amLODipine (NORVASC) 5 MG tablet      naproxen sodium (ANAPROX) 220 MG tablet           Discharge Condition:    Discharge Instructions Get Medicines reviewed and adjusted: Please take all your medications with you for your next visit with your Primary MD  Please request your Primary MD to go over all hospital tests and procedure/radiological results at the follow up, please ask your Primary MD to get all Hospital records sent to his/her office.  If you experience worsening of your admission symptoms, develop shortness of breath, life threatening emergency, suicidal or homicidal thoughts you must seek medical attention immediately by calling 911 or calling your MD immediately if symptoms less severe.  You must read complete instructions/literature along with all the possible adverse reactions/side effects for all the Medicines you take and that have been prescribed to you. Take any new Medicines after you have completely understood and accpet all the possible adverse reactions/side effects.   Do not drive when taking Pain medications.   Do not take more than prescribed Pain, Sleep and Anxiety Medications  Special Instructions: If you have smoked or chewed Tobacco in the last 2 yrs please stop smoking, stop any regular Alcohol and or any Recreational drug use.  Wear Seat belts while driving.  Please note  You were cared for by a hospitalist during your hospital stay. Once you are discharged, your primary care physician will handle any further medical issues. Please note that NO REFILLS for any discharge medications will be authorized once you are discharged, as it is imperative that you return to your primary care physician (or establish a relationship with a primary care physician if you do not have one) for your aftercare needs so that they can reassess your need for medications and monitor your lab values.  Discharge Instructions    Diet - low sodium heart healthy    Complete  by:  As directed    Full weight bearing    Complete by:  As directed    Increase activity slowly    Complete by:  As directed        Allergies  Allergen Reactions  . Sulfa Antibiotics Rash and Other (See Comments)    fever      Disposition: SNF   Consults: * Orthopedics    Significant Diagnostic Studies:  Pelvis Portable  Result Date: 09/18/2016 CLINICAL DATA:  LEFT total hip replacement. EXAM: PORTABLE PELVIS 1-2 VIEWS COMPARISON:  Radiograph 09/16/2016 FINDINGS: Interval LEFT hip total arthroplasty. No fracture dislocation. Surgical skin staples noted. Vascular calcifications noted. IMPRESSION: No complication following LEFT hip arthroplasty. Electronically Signed   By: Suzy Bouchard M.D.   On: 09/18/2016 11:20   Dg Chest Port 1 View  Result Date: 09/16/2016 CLINICAL DATA:  Preop for left hip fracture repair. EXAM: PORTABLE CHEST 1 VIEW COMPARISON:  Radiographs of June 16, 2016. FINDINGS: Stable cardiomegaly and central pulmonary vascular congestion. Atherosclerosis of thoracic aorta is noted. No pneumothorax or pleural effusion is noted. Stable elevated right hemidiaphragm is noted. No consolidative process is noted. Bony thorax is unremarkable. IMPRESSION: Stable cardiomegaly and central pulmonary vascular congestion. Aortic atherosclerosis. No consolidative process is noted. Electronically Signed   By: Marijo Conception, M.D.   On: 09/16/2016 14:17   Dg C-arm 61-120 Min  Result Date: 09/18/2016 CLINICAL DATA:  83 year old female with left hip fracture fixation EXAM: OPERATIVE LEFT HIP (WITH PELVIS IF PERFORMED) 2 VIEWS TECHNIQUE: Fluoroscopic spot image(s) were submitted for interpretation post-operatively. COMPARISON:  09/16/2016 FINDINGS: Intraoperative fluoroscopic spot images of left hip fracture repair. The acetabular and femoral components appear congruent, with single or cerclage wire at the proximal aspect of the left femur/ femoral stem. Gas within the soft  tissues. IMPRESSION: Intraoperative images of left hip arthroplasty for fracture repair, with no immediate complicating features. Please refer to the dictated operative report for full details of intraoperative findings and procedure. Electronically Signed   By: Corrie Mckusick D.O.   On: 09/18/2016 10:54   Dg Hip Operative Unilat W Or W/o Pelvis Left  Result Date: 09/18/2016 CLINICAL DATA:  83 year old female with left hip fracture fixation EXAM: OPERATIVE LEFT HIP (WITH PELVIS IF PERFORMED) 2 VIEWS TECHNIQUE: Fluoroscopic spot image(s) were submitted for interpretation post-operatively. COMPARISON:  09/16/2016 FINDINGS: Intraoperative fluoroscopic spot images of left hip fracture repair. The acetabular and femoral components appear congruent, with single or cerclage wire at the proximal aspect of the left femur/ femoral stem. Gas within the soft tissues. IMPRESSION: Intraoperative images of left hip arthroplasty for fracture repair, with no immediate complicating features. Please refer to the dictated operative report for full details of intraoperative findings and procedure. Electronically Signed   By: Corrie Mckusick D.O.   On: 09/18/2016 10:54   Dg Hip Unilat W Or W/o Pelvis 2-3 Views Left  Result Date: 09/16/2016 CLINICAL DATA:  83 year old female with 6 weeks of left hip pain, no known injury. EXAM: DG HIP (WITH OR WITHOUT PELVIS) 2-3V LEFT COMPARISON:  PET-CT 07/26/2005 FINDINGS: Aortoiliac calcified atherosclerosis. Bilateral calcified femoral artery atherosclerosis. Osteopenia. The pelvis appears intact. Grossly intact proximal right femur. The left femoral head is normally located. There is a left femoral neck fracture with mild varus angulation, no other displacement. The proximal left femur intertrochanteric segment appears to remain intact. Proximal left femoral shaft is intact. IMPRESSION: 1. Left femoral neck fracture with mild varus angulation appears acute versus subacute. 2. Osteopenia.  No  other acute osseous abnormality identified. 3. Calcified aortic and iliofemoral atherosclerosis. Electronically Signed   By: Genevie Ann M.D.   On: 09/16/2016 12:50        Filed Weights   09/20/16 1003 09/20/16 1340 09/20/16 1648  Weight: 53.7 kg (118 lb 6.2 oz) 52.1 kg (114 lb 13.8 oz) 53.1 kg (117 lb 1 oz)     Microbiology: Recent Results (from the past 240 hour(s))  Surgical pcr screen     Status: None   Collection Time: 09/17/16  2:04 AM  Result Value Ref Range Status   MRSA, PCR NEGATIVE NEGATIVE Final   Staphylococcus aureus NEGATIVE NEGATIVE Final    Comment:        The Xpert SA Assay (FDA approved for NASAL specimens in patients over 33 years of age), is one component of a comprehensive surveillance program.  Test performance has been validated by [facility] for patients  greater than or equal to 58 year old. It is not intended to diagnose infection nor to guide or monitor treatment.        Blood Culture No results found for: SDES, SPECREQUEST, CULT, REPTSTATUS    Labs: Results for orders placed or performed during the hospital encounter of 09/16/16 (from the past 48 hour(s))  Glucose, capillary     Status: Abnormal   Collection Time: 09/19/16 12:17 PM  Result Value Ref Range   Glucose-Capillary 241 (H) 65 - 99 mg/dL  Glucose, capillary     Status: Abnormal   Collection Time: 09/19/16  4:39 PM  Result Value Ref Range   Glucose-Capillary 246 (H) 65 - 99 mg/dL  Glucose, capillary     Status: Abnormal   Collection Time: 09/19/16  9:55 PM  Result Value Ref Range   Glucose-Capillary 183 (H) 65 - 99 mg/dL  CBC     Status: Abnormal   Collection Time: 09/20/16  4:27 AM  Result Value Ref Range   WBC 9.0 4.0 - 10.5 K/uL   RBC 1.99 (L) 3.87 - 5.11 MIL/uL   Hemoglobin 5.7 (LL) 12.0 - 15.0 g/dL    Comment: REPEATED TO VERIFY CRITICAL RESULT CALLED TO, READ BACK BY AND VERIFIED WITH: A.STENNIS,RN 0539 09/20/16 M.CAMPBELL    HCT 18.2 (L) 36.0 - 46.0 %   MCV 91.5  78.0 - 100.0 fL   MCH 28.6 26.0 - 34.0 pg   MCHC 31.3 30.0 - 36.0 g/dL   RDW 20.9 (H) 11.5 - 15.5 %   Platelets 138 (L) 150 - 400 K/uL  Comprehensive metabolic panel     Status: Abnormal   Collection Time: 09/20/16  4:27 AM  Result Value Ref Range   Sodium 135 135 - 145 mmol/L   Potassium 4.9 3.5 - 5.1 mmol/L    Comment: DELTA CHECK NOTED SPECIMEN HEMOLYZED. HEMOLYSIS MAY AFFECT INTEGRITY OF RESULTS.    Chloride 96 (L) 101 - 111 mmol/L   CO2 28 22 - 32 mmol/L   Glucose, Bld 150 (H) 65 - 99 mg/dL   BUN 39 (H) 6 - 20 mg/dL   Creatinine, Ser 5.30 (H) 0.44 - 1.00 mg/dL    Comment: DELTA CHECK NOTED   Calcium 7.6 (L) 8.9 - 10.3 mg/dL   Total Protein 4.8 (L) 6.5 - 8.1 g/dL   Albumin 2.3 (L) 3.5 - 5.0 g/dL   AST 62 (H) 15 - 41 U/L   ALT <5 (L) 14 - 54 U/L    Comment: REPEATED TO VERIFY   Alkaline Phosphatase 89 38 - 126 U/L   Total Bilirubin 0.7 0.3 - 1.2 mg/dL   GFR calc non Af Amer 7 (L) >60 mL/min   GFR calc Af Amer 8 (L) >60 mL/min    Comment: (NOTE) The eGFR has been calculated using the CKD EPI equation. This calculation has not been validated in all clinical situations. eGFR's persistently <60 mL/min signify possible Chronic Kidney Disease.    Anion gap 11 5 - 15  Glucose, capillary     Status: Abnormal   Collection Time: 09/20/16  5:58 AM  Result Value Ref Range   Glucose-Capillary 143 (H) 65 - 99 mg/dL  Prepare RBC     Status: None   Collection Time: 09/20/16  8:55 AM  Result Value Ref Range   Order Confirmation ORDER PROCESSED BY BLOOD BANK   Type and screen [place]     Status: None   Collection Time: 09/20/16  9:00 AM  Result Value Ref Range   ABO/RH(D) O POS    Antibody Screen NEG    Sample Expiration 09/23/2016    Unit Number [id number]    Blood Component Type RED CELLS,LR    Unit division 00    Status of Unit ISSUED,FINAL    Transfusion Status OK TO TRANSFUSE    Crossmatch Result Compatible    Unit Number [id number]    Blood  Component Type RED CELLS,LR    Unit division 00    Status of Unit ISSUED,FINAL    Transfusion Status OK TO TRANSFUSE    Crossmatch Result Compatible   Glucose, capillary     Status: Abnormal   Collection Time: 09/20/16 12:16 PM  Result Value Ref Range   Glucose-Capillary 180 (H) 65 - 99 mg/dL   Comment 1 Notify RN    Comment 2 Document in Chart   Glucose, capillary     Status: Abnormal   Collection Time: 09/20/16  6:00 PM  Result Value Ref Range   Glucose-Capillary 122 (H) 65 - 99 mg/dL  CBC     Status: Abnormal   Collection Time: 09/21/16  5:50 AM  Result Value Ref Range   WBC 11.0 (H) 4.0 - 10.5 K/uL   RBC 2.91 (L) 3.87 - 5.11 MIL/uL   Hemoglobin 8.2 (L) 12.0 - 15.0 g/dL    Comment: REPEATED TO VERIFY POST TRANSFUSION SPECIMEN    HCT 24.6 (L) 36.0 - 46.0 %   MCV 84.5 78.0 - 100.0 fL    Comment: REPEATED TO VERIFY POST TRANSFUSION SPECIMEN    MCH 28.2 26.0 - 34.0 pg   MCHC 33.3 30.0 - 36.0 g/dL   RDW 20.0 (H) 11.5 - 15.5 %   Platelets 136 (L) 150 - 400 K/uL  Basic metabolic panel     Status: Abnormal   Collection Time: 09/21/16  5:50 AM  Result Value Ref Range   Sodium 136 135 - 145 mmol/L   Potassium 3.8 3.5 - 5.1 mmol/L   Chloride 99 (L) 101 - 111 mmol/L   CO2 24 22 - 32 mmol/L   Glucose, Bld 124 (H) 65 - 99 mg/dL   BUN 28 (H) 6 - 20 mg/dL   Creatinine, Ser 3.90 (H) 0.44 - 1.00 mg/dL   Calcium 7.6 (L) 8.9 - 10.3 mg/dL   GFR calc non Af Amer 10 (L) >60 mL/min   GFR calc Af Amer 11 (L) >60 mL/min    Comment: (NOTE) The eGFR has been calculated using the CKD EPI equation. This calculation has not been validated in all clinical situations. eGFR's persistently <60 mL/min signify possible Chronic Kidney Disease.    Anion gap 13 5 - 15  Glucose, capillary     Status: Abnormal   Collection Time: 09/21/16  6:03 AM  Result Value Ref Range   Glucose-Capillary 124 (H) 65 - 99 mg/dL     Lipid Panel     Component Value Date/Time   CHOL 122 06/19/2015 0949   TRIG  73 06/19/2015 0949   HDL 65 06/19/2015 0949   CHOLHDL 1.9 06/19/2015 0949   VLDL 15 06/19/2015 0949   LDLCALC 42 06/19/2015 0949     Lab Results  Component Value Date   HGBA1C 6.0 (H) 09/16/2016   HGBA1C 5.4 09/23/2014   HGBA1C 4.8 04/09/2014      HPI :    MIRRIAM VADALA is a 83 y.o. female with  Extensive medical history including  ESRD on hemodialysis Tuesday Thursday Saturday,  Osteoporosis,  hypertension, hyperlipidemia, CAD, chronic anemia, presenting with one to 2 months history of left hip pain, initially diagnosed with bursitis. left hip x-ray Left femoral neck fracture with mild varus angulation appears acute versus subacute.Cardiology consult obtained for preop clearance   HOSPITAL COURSE:  Left fem neck Fracture   status post hemiarthroplasty of the left, by Dr. Erlinda Hong. 6/23   Telemetry uneventful, Cardiology consulted for preop clearance and deemed to be high risk for her age and comorbidities, severe three-vessel coronary artery disease, not a candidate for PCI or CABG PT/OT -SNF Currently on heparin for DVT prophylaxis  ESRD on HD  Tuesday Thursday Saturday  Primary Nephrologist Dr. Blanchard Mane [facility]   was consulted    Hypertension Continue beta blocker Unable to continue Norvasc due to low blood pressure   Multivessel CAD  last cardiac catheterization 03/2015 on medical RX . Felt not to be a candidate for PCI or bypass surgery Continue ASA 81 mg instead of 325 mg due to anemia, continuehigh dose statin, beta blockers most recent 2-D echo 07/15/16, EF 65-70%, shows pulmonary hypertension  Hypertension  Blood pressure has been running soft, will hold all antihypertensive medications for systolic less than 498 Continue home anti-hypertensive medications after HD    Hyperlipidemia Continue home statins  GERD,no acute symptoms Continue PPI  Anemia of chronic disease/acute blood loss anemia Hemoglobin on admission 11.2 at baseline . Dropped  to 5.7, status post 2 units of packed red blood cells with hemodialysis  Hemoglobin now 8.2   Hyperglycemia, patient denies H/O DM,  noted to be hypoglycemic this admission Hemoglobin A1c 6.0, started on sliding scale insulin, now discontinued  Chronic respiratory failure with hypoxia, wears 2 L home O2 prn, no diagnosis of COPD. NO Tobacco., NAD. CXR without infiltrate , continue home oxygen   UTI Patient initiated on Rocephin,   now changed to cefpodoxime for 3 more days      Discharge Exam:   Blood pressure (!) 111/51, pulse 76, temperature 98 F (36.7 C), temperature source Oral, resp. rate 16, height _0  (1.575 m), weight 53.1 kg (117 lb 1 oz), SpO2 100 %.      Follow-up Information    Mcarthur Rossetti, MD. Schedule an appointment as soon as possible for a visit in 2 week(s).   Specialty:  Orthopedic Surgery Contact information: [place] [address] [phone]        [name], MD. Call.   Specialty:  Family Medicine Why:  Hospital follow-up in 3-5 days Contact information: [place] [address] [phone]           Signed: Reyne Dumas 09/21/2016, 9:25 AM        Time spent >1 hour

## 2016-09-21 NOTE — Progress Notes (Signed)
Physical Therapy Treatment Patient Details Name: [name] MRN: [id number] DOB: [birth year] Today's Date: 09/21/2016    History of Present Illness 83 y.o.femalepresenting with one to 2 months history of left hip pain, initially diagnosed with bursitis - denies recent falls. Pt was brought in by EMS on 09/16/16 and x-rays showed a left femoral neck fx and orthopedic surgery was consulted. Underwent left direct anterior THA on 09/18/16. PHM includingESRD on hemodialysis Tuesday Thursday Saturday, Osteoporosis, hypertension, hyperlipidemia, CAD, and chronic anemia.    PT Comments    Pts mobility is progressing as she was able to perform HEP and ambulate 8 ft today. She is limited by weakness in the LE requiring Max assist +2 with ambulation and fatigues quickly. She should benefit from continued skilled PT to address activity tolerance and increase functional independence. Will continue to follow acutely.    Follow Up Recommendations  SNF;Supervision/Assistance - 24 hour     Equipment Recommendations  Rolling walker with 5" wheels    Recommendations for Other Services       Precautions / Restrictions Precautions Precautions: Fall Restrictions Weight Bearing Restrictions: Yes LLE Weight Bearing: Weight bearing as tolerated    Mobility  Bed Mobility Overal bed mobility: Needs Assistance Bed Mobility: Supine to Sit     Supine to sit: Mod assist     General bed mobility comments: Mod for technique. Assist to elevate trunk and progress hips to EOB.  Transfers Overall transfer level: Needs assistance Equipment used: Rolling walker (2 wheeled) Transfers: Sit to/from Stand Sit to Stand: Mod assist         General transfer comment: Cueing for hand placement and technique. Mod assist to power up into standing and to place L UE on RW.  Ambulation/Gait Ambulation/Gait assistance: +2 physical assistance;Max assist;+2 safety/equipment Ambulation Distance (Feet): 8  Feet Assistive device: Rolling walker (2 wheeled) Gait Pattern/deviations: Step-to pattern;Shuffle;Staggering left;Trunk flexed;Narrow base of support Gait velocity: decreased Gait velocity interpretation: Below normal speed for age/gender General Gait Details: Pt very unsteady with ambulation requiring +2 assist for safety. Max cueing for walker management and assist to steer walker as pt veers to the R. She is able to pick up her feet and weight bear with out complaint but is overall weak.   Stairs            Wheelchair Mobility    Modified Rankin (Stroke Patients Only)       Balance Overall balance assessment: Needs assistance Sitting-balance support: Feet supported;Bilateral upper extremity supported Sitting balance-Leahy Scale: Poor     Standing balance support: Bilateral upper extremity supported;During functional activity Standing balance-Leahy Scale: Zero Standing balance comment: Reliant on RW and physical A for standing balance                            Cognition Arousal/Alertness: Lethargic Behavior During Therapy: Flat affect (very tired) Overall Cognitive Status: Within Functional Limits for tasks assessed                                        Exercises Total Joint Exercises Ankle Circles/Pumps: AROM;Both;10 reps;Supine Quad Sets: AROM;Left;10 reps;Supine Short Arc Quad: AROM;Left;10 reps;Supine (cueing for controlled descent) Heel Slides: AROM;Left;10 reps;Supine Hip ABduction/ADduction: AAROM;Left;10 reps;Supine    General Comments        Pertinent Vitals/Pain Pain Assessment: No/denies pain Pain Score: 0-No pain Pain  Intervention(s): Monitored during session    Home Living                      Prior Function            PT Goals (current goals can now be found in the care plan section) Acute Rehab PT Goals Patient Stated Goal: Get stronger and return to PLOF PT Goal Formulation: With patient Time  For Goal Achievement: 10/03/16 Potential to Achieve Goals: Fair Progress towards PT goals: Progressing toward goals    Frequency    Min 3X/week      PT Plan Current plan remains appropriate    Co-evaluation              AM-PAC PT "6 Clicks" Daily Activity  Outcome Measure  Difficulty turning over in bed (including adjusting bedclothes, sheets and blankets)?: Total Difficulty moving from lying on back to sitting on the side of the bed? : Total Difficulty sitting down on and standing up from a chair with arms (e.g., wheelchair, bedside commode, etc,.)?: Total Help needed moving to and from a bed to chair (including a wheelchair)?: A Lot Help needed walking in hospital room?: A Lot Help needed climbing 3-5 steps with a railing? : Total 6 Click Score: 8    End of Session Equipment Utilized During Treatment: Gait belt;Oxygen Activity Tolerance: Patient tolerated treatment well Patient left: with nursing/sitter in room;in chair;with family/visitor present (In 3&1 for bath, nurse tech present) Nurse Communication: Mobility status;Other (comment) PT Visit Diagnosis: Muscle weakness (generalized) (M62.81);Difficulty in walking, not elsewhere classified (R26.2)     Time: 6681-5947 PT Time Calculation (min) (ACUTE ONLY): 23 min  Charges:  $Gait Training: 8-22 mins $Therapeutic Exercise: 8-22 mins                    G Codes:       Benjiman Core, [place] Pager [phone] Acute Rehab    Allena Katz 09/21/2016, 12:11 PM

## 2016-09-21 NOTE — NC FL2 (Signed)
[place] LEVEL OF CARE SCREENING TOOL     IDENTIFICATION  Patient Name: MARCHEL FOOTE Birthdate: [birth year] Sex: female Admission Date (Current Location): 09/16/2016  [facility] and [place] Number:  Herbalist and Address:  [facility] [place] [place] [address] [place], [place] [address]      Provider Number: [id number]  Attending Physician Name and Address:  Reyne Dumas, MD  Relative Name and Phone Number:       Current Level of Care: Hospital Recommended Level of Care: [place] Prior Approval Number:    Date Approved/Denied: 09/20/16 PASRR Number: [id number]  Discharge Plan: SNF    Current Diagnoses: Patient Active Problem List   Diagnosis Date Noted  . Protein-calorie malnutrition ([place]) 09/19/2016  . Closed fracture of neck of left femur ([place])   . Congestive heart failure (CHF) ([place])   . S/p left hip fracture 09/16/2016  . Hyperglycemia 09/16/2016  . Hip fracture ([place]) 09/16/2016  . Iron deficiency anemia due to chronic blood loss   . Chronic respiratory failure with hypoxia, on home O2 therapy ([place]) 06/23/2016  . Heme positive stool   . Chronic midline low back pain without sciatica 02/26/2016  . Osteoporosis 12/24/2015  . Forehead laceration 12/16/2015  . Fall 12/16/2015  . Advanced care planning/counseling discussion 11/06/2015  . Moderate to severe pulmonary hypertension ([place]) 06/13/2015  . Abnormal chest x-ray 06/13/2015  . CAD-severe multivessel, not CABG candidate 05/06/2015  . Abnormal nuclear stress test   . Abnormal cardiovascular function study 03/06/2015  . Demand ischemia ([place])   . Acute on chronic diastolic congestive heart failure ([place]) 09/23/2014  . Exertional dyspnea   . NSTEMI (non-ST elevated myocardial infarction) ([place])   . GAVE (gastric antral vascular ectasia) 04/09/2014  . Anemia due to blood loss, chronic 04/08/2014  . Upper GI bleed   . Right shoulder injury 03/12/2014  .  Left foot pain 01/10/2014  . Nasal congestion 11/13/2013  . Osteoarthritis, hand 11/13/2013  . Imbalance 11/16/2012  . Abnormal electrocardiogram 07/25/2012  . Mood disorder ([place]) 06/01/2012  . Systolic murmur 33/35/4562  . History of diabetes mellitus, type II   . Hyperlipidemia   . Hypertension   . Chronic Facial droop   . ESRD on dialysis ([employer])   . GERD (gastroesophageal reflux disease)     Orientation RESPIRATION BLADDER Height & Weight     Self, Time, Situation, Place  O2 (Nasal Cannula 3L) Continent Weight: 117 lb 1 oz (53.1 kg) Height:  5\' 2"  (157.5 cm)  BEHAVIORAL SYMPTOMS/MOOD NEUROLOGICAL BOWEL NUTRITION STATUS      Continent Diet (See DC Summary)  AMBULATORY STATUS COMMUNICATION OF NEEDS Skin   Limited Assist Verbally Surgical wounds (Closed Left Leg Incision with Silver Hydrofiber)                       Personal Care Assistance Level of Assistance  Bathing, Feeding, Dressing Bathing Assistance: Maximum assistance Feeding assistance: Limited assistance Dressing Assistance: Maximum assistance     Functional Limitations Info  Hearing, Speech, Sight Sight Info: Adequate Hearing Info: Adequate Speech Info: Adequate    SPECIAL CARE FACTORS FREQUENCY  PT (By licensed PT), OT (By licensed OT)     PT Frequency: 5xweek OT Frequency: 5xweek            Contractures      Additional Factors Info  Code Status, Allergies, Insulin Sliding Scale Code Status Info: Full Allergies Info: NKA  Insulin Sliding Scale Info: 9 units 3x's a day       Current Medications (09/21/2016):  This is the current hospital active medication list Current Facility-Administered Medications  Medication Dose Route Frequency Provider Last Rate Last Dose  . 0.9 %  sodium chloride infusion   Intravenous Once Roderic Palau, MD      . acetaminophen (TYLENOL) tablet 650 mg  650 mg Oral Q6H PRN Mcarthur Rossetti, MD   650 mg at 09/19/16 1351   Or  . acetaminophen  (TYLENOL) suppository 650 mg  650 mg Rectal Q6H PRN Mcarthur Rossetti, MD      . aspirin EC tablet 325 mg  325 mg Oral Q breakfast Mcarthur Rossetti, MD   325 mg at 09/20/16 0747  . atorvastatin (LIPITOR) tablet 80 mg  80 mg Oral QHS Rondel Jumbo, PA-C   80 mg at 09/20/16 2104  . bisacodyl (DULCOLAX) EC tablet 5 mg  5 mg Oral Daily PRN Rondel Jumbo, PA-C   5 mg at 09/20/16 0747  . calcitRIOL (ROCALTROL) capsule 0.25 mcg  0.25 mcg Oral Q T,Th,Sa-HD [name], MD   0.25 mcg at 09/18/16 1309  . calcium carbonate (OS-CAL - dosed in mg of elemental calcium) tablet 3,125 mg  3,125 mg Oral TID WC Valentina Gu, NP   3,125 mg at 09/20/16 1755  . cefTRIAXone (ROCEPHIN) 1 g in dextrose 5 % 50 mL IVPB  1 g Intravenous Q24H Reyne Dumas, MD   Stopped at 09/20/16 1022  . Darbepoetin Alfa (ARANESP) injection 100 mcg  100 mcg Intravenous Q Tue-HD Corliss Parish, MD      . diphenhydrAMINE (BENADRYL) capsule 25 mg  25 mg Oral Q6H PRN Jani Gravel, MD      . feeding supplement (PRO-STAT SUGAR FREE 64) liquid 30 mL  30 mL Oral BID Jani Gravel, MD   30 mL at 09/20/16 [phone]  . ferric gluconate (NULECIT) 125 mg in sodium chloride 0.9 % 100 mL IVPB  125 mg Intravenous Q T,Th,Sa-HD Valentina Gu, NP   Stopped at 09/18/16 2039  . heparin injection 5,000 Units  5,000 Units Subcutaneous Q8H Rondel Jumbo, PA-C   5,000 Units at 09/21/16 2229  . HYDROcodone-acetaminophen (NORCO/VICODIN) 5-325 MG per tablet 1-2 tablet  1-2 tablet Oral Q6H PRN Mcarthur Rossetti, MD   1 tablet at 09/20/16 0911  . insulin aspart (novoLOG) injection 0-9 Units  0-9 Units Subcutaneous TID WC Reyne Dumas, MD   1 Units at 09/21/16 [phone]  . menthol-cetylpyridinium (CEPACOL) lozenge 3 mg  1 lozenge Oral PRN Mcarthur Rossetti, MD       Or  . phenol (CHLORASEPTIC) mouth spray 1 spray  1 spray Mouth/Throat PRN Mcarthur Rossetti, MD      . metoCLOPramide (REGLAN) tablet 5 mg  5 mg Oral Q8H PRN  Mcarthur Rossetti, MD       Or  . metoCLOPramide (REGLAN) injection 5 mg  5 mg Intravenous Q8H PRN Mcarthur Rossetti, MD      . metoprolol tartrate (LOPRESSOR) tablet 12.5 mg  12.5 mg Oral BID Rondel Jumbo, PA-C   12.5 mg at 09/17/16 2104  . morphine 4 MG/ML injection 0.52 mg  0.52 mg Intravenous Q2H PRN Mcarthur Rossetti, MD   0.52 mg at 09/19/16 1028  . morphine 4 MG/ML injection 4 mg  4 mg Intravenous Once Lajean Saver, MD   Stopped at 09/16/16 1323  . multivitamin (RENA-VIT) tablet  1 tablet  1 tablet Oral QHS Rondel Jumbo, PA-C   1 tablet at 09/20/16 2104  . ondansetron (ZOFRAN) injection 4 mg  4 mg Intravenous Once Lajean Saver, MD   Stopped at 09/16/16 1323  . ondansetron (ZOFRAN) tablet 4 mg  4 mg Oral Q6H PRN Mcarthur Rossetti, MD       Or  . ondansetron [facility]) injection 4 mg  4 mg Intravenous Q6H PRN Mcarthur Rossetti, MD   4 mg at 09/18/16 1129  . pantoprazole (PROTONIX) EC tablet 40 mg  40 mg Oral BID Rondel Jumbo, PA-C   40 mg at 09/20/16 2104  . polyethylene glycol 0.4% and propylene glycol 0.3% (SYSTANE) ophthalmic gel  1 application Right Eye G6K PRN Rondel Jumbo, PA-C      . senna-docusate (Senokot-S) tablet 1 tablet  1 tablet Oral QHS PRN Rondel Jumbo, PA-C      . sodium phosphate (FLEET) 7-19 GM/118ML enema 1 enema  1 enema Rectal Once PRN Rondel Jumbo, PA-C         Discharge Medications: Please see discharge summary for a list of discharge medications.  Relevant Imaging Results:  Relevant Lab Results:   Additional Information SS: [id number] ; dialysis m w f  Normajean Baxter, LCSW

## 2016-09-21 NOTE — Social Work (Signed)
CSW received bed offer from [facility]. Pt has accepted bed offer. CSW sent over dc summary for review. CSW will set up transport.

## 2016-09-21 NOTE — Clinical Social Work Placement (Signed)
   CLINICAL SOCIAL WORK PLACEMENT  NOTE  Date:  09/21/2016  Patient Details  Name: [name] MRN: [id number] Date of Birth: Mar 22, 1932  Clinical Social Work is seeking post-discharge placement for this patient at the [place] level of care (*CSW will initial, date and re-position this form in  chart as items are completed):  Yes   Patient/family provided with [place] Work Department's list of facilities offering this level of care within the geographic area requested by the patient (or if unable, by the patient's family).  Yes   Patient/family informed of their freedom to choose among providers that offer the needed level of care, that participate in Medicare, Medicaid or managed care program needed by the patient, have an available bed and are willing to accept the patient.  Yes   Patient/family informed of [place]'s ownership interest in [facility] and [facility] as well as of the fact that they are under no obligation to receive care at these facilities.  PASRR submitted to EDS on       PASRR number received on 09/20/16     Existing PASRR number confirmed on 09/20/16     FL2 transmitted to all facilities in geographic area requested by pt/family on 09/20/16     FL2 transmitted to all facilities within larger geographic area on 09/20/16     Patient informed that his/her managed care company has contracts with or will negotiate with certain facilities, including the following:        Yes   Patient/family informed of bed offers received.  Patient chooses bed at [facility]     Physician recommends and patient chooses bed at      Patient to be transferred to [facility] on 09/21/16.  Patient to be transferred to facility by [facility]     Patient family notified on 09/21/16 of transfer.  Name of family member notified:  patient responsible for self and will notify friends     PHYSICIAN Please  prepare priority discharge summary, including medications, Please prepare prescriptions, Please sign FL2     Additional Comment:    _______________________________________________ Normajean Baxter, LCSW 09/21/2016, 12:59 PM

## 2016-09-21 NOTE — Progress Notes (Signed)
Pt transported via stretcher to SNF. All belongings and paperwork sent with transporters/patient. Report called to RN at [facility]. BP (!) 105/52 (BP Location: Right Arm)   Pulse 79   Temp 98.2 F (36.8 C) (Oral)   Resp 17   Ht 5\' 2"  (1.575 m)   Wt 53.1 kg (117 lb 1 oz)   SpO2 95%   BMI 21.41 kg/m

## 2016-09-21 NOTE — Care Management Important Message (Signed)
Important Message  Patient Details  Name: [name] MRN: [id number] Date of Birth: Mar 05, 1932   Medicare Important Message Given:  Yes    Brindy Higginbotham Montine Circle 09/21/2016, 11:42 AM

## 2016-09-21 NOTE — Progress Notes (Signed)
Subjective:  Hb 8's today  Objective Vital signs in last 24 hours: Vitals:   09/20/16 1630 09/20/16 1648 09/20/16 2300 09/21/16 0500  BP: (!) 84/29 (!) 106/55 (!) 111/49 (!) 111/51  Pulse: 85 72 82 76  Resp:  16 16 16   Temp:  97.4 F (36.3 C) 98 F (36.7 C) 98 F (36.7 C)  TempSrc:  Oral Oral Oral  SpO2:  100% 100% 100%  Weight:  53.1 kg (117 lb 1 oz)    Height:       Weight change:   Intake/Output Summary (Last 24 hours) at 09/21/16 1319 Last data filed at 09/21/16 0900  Gross per 24 hour  Intake              910 ml  Output             -870 ml  Net             1780 ml    Dialysis:  [facility] T[facility] 3h  F160  2/2 bath  49.5kg   P4   Hep none -Mircera 50 mcg IV Q 4 weeks (last dose 09/11/16 HGB 10.5 09/14/16 ) -Venofer 100 mg IV X 10 doses (has not been started yet-Fe 71 Tsat 27% Ferritin 169 09/08/16) -Calcitriol 0.25 mcg PO TIW (Last PTH 197 09/08/16)  BMD meds: TUMS 3000 mg PO TID AC (Last Ca 9.4 C Ca 9.6 phos 4.2 09/08/16)  Assessment/Plan: 1. L Hip Fracture: Orthopedic consult. S/p L hip arthroplasty 6/23 2. Anemia of ABL/ CKD - sp prbc's, ESA/ IV fe 3. ESRD -  usual HD TTS; plan next HD Thursday (had today's HD yesterday for transfusion) 4. Hypertension/volume  - stable  5. Metabolic bone disease -  Cont oscal, rocaltrol 6. Nutrition - Renal diet, renal vit/nepro 7. H/O multi-vessel CAD: No chest pain reported. Was considered not a candidate for CABG in past 8. Dispo - per primary, stable for d/c from renal standpoint    Kelly Splinter MD [facility] pgr [phone]   09/21/2016, 1:19 PM     Labs: Basic Metabolic Panel:  Recent Labs Lab 09/17/16 2214 09/18/16 1744 09/19/16 0411 09/20/16 0427 09/21/16 0550  NA 138 137 137 135 136  K 4.1 4.5 3.6 4.9 3.8  CL 98* 101 98* 96* 99*  CO2 29 22 28 28 24   GLUCOSE 110* 117* 97 150* 124*  BUN 36* 53* 17 39* 28*  CREATININE 5.68* 6.68* 3.43* 5.30* 3.90*  CALCIUM 8.3* 7.6* 7.8* 7.6* 7.6*   PHOS 4.8* 6.8*  --   --   --    Liver Function Tests:  Recent Labs Lab 09/18/16 1744 09/19/16 0411 09/20/16 0427  AST  --  48* 62*  ALT  --  17 <5*  ALKPHOS  --  84 89  BILITOT  --  0.9 0.7  PROT  --  5.3* 4.8*  ALBUMIN 2.6* 2.8* 2.3*   No results for input(s): LIPASE, AMYLASE in the last 168 hours. No results for input(s): AMMONIA in the last 168 hours. CBC:  Recent Labs Lab 09/17/16 2214 09/18/16 1743 09/19/16 0411 09/20/16 0427 09/21/16 0550  WBC 6.4 8.0 6.5 9.0 11.0*  HGB 10.4* 8.4* 8.2* 5.7* 8.2*  HCT 33.2* 26.4* 25.5* 18.2* 24.6*  MCV 91.5 89.2 90.1 91.5 84.5  PLT 229 182 142* 138* 136*   Cardiac Enzymes:  Recent Labs Lab 09/16/16 1656  TROPONINI 0.05*   CBG:  Recent Labs Lab 09/20/16 0558 09/20/16 1216 09/20/16 1800 09/21/16  0603 09/21/16 1103  GLUCAP 143* 180* 122* 124* 136*    Iron Studies: No results for input(s): IRON, TIBC, TRANSFERRIN, FERRITIN in the last 72 hours. Studies/Results: No results found. Medications: Infusions: . sodium chloride    . cefTRIAXone (ROCEPHIN)  IV Stopped (09/21/16 1017)  . ferric gluconate (FERRLECIT/NULECIT) IV Stopped (09/18/16 2039)    Scheduled Medications: . aspirin EC  325 mg Oral Q breakfast  . atorvastatin  80 mg Oral QHS  . calcitRIOL  0.25 mcg Oral Q T,Th,Sa-HD  . calcium carbonate  3,125 mg Oral TID WC  . darbepoetin (ARANESP) injection - DIALYSIS  100 mcg Intravenous Q Tue-HD  . feeding supplement (PRO-STAT SUGAR FREE 64)  30 mL Oral BID  . heparin  5,000 Units Subcutaneous Q8H  . insulin aspart  0-9 Units Subcutaneous TID WC  . metoprolol tartrate  12.5 mg Oral BID  .  morphine injection  4 mg Intravenous Once  . multivitamin  1 tablet Oral QHS  . ondansetron (ZOFRAN) IV  4 mg Intravenous Once  . pantoprazole  40 mg Oral BID    have reviewed scheduled and prn medications.  Physical Exam: General: stable, no distress Heart: RRR Lungs: few scattered rhonchi clears with  coughing Abdomen: soft, non tender Extremities: no edema, 2+ DP pulses, L thigh mild-mod postop swelling Dialysis Access: left AVF     09/21/2016,1:19 PM  LOS: 5 days

## 2016-09-21 NOTE — Social Work (Signed)
Clinical Social Worker facilitated patient discharge including contacting facility to confirm patient discharge plans.  Clinical information faxed to facility and patient agreeable with plan.    CSW arranged ambulance transport via [place] to [facility] in  [place].    RN to call [phone] to give report prior to discharge.  Clinical Social Worker will sign off for now as social work intervention is no longer needed. Please consult [place] again if new need arises.  Elissa Hefty, LCSW Clinical Social Worker [phone]

## 2016-09-21 NOTE — Social Work (Signed)
CSW called [facility] and spoke with Magda Paganini in admissions who indicated that she will review and advise if she can make a bed offer.  CSW will continue to f/u.

## 2016-09-22 ENCOUNTER — Emergency Department: Payer: Medicare Other

## 2016-09-22 ENCOUNTER — Emergency Department
Admission: EM | Admit: 2016-09-22 | Discharge: 2016-09-26 | Disposition: E | Payer: Medicare Other | Attending: Emergency Medicine | Admitting: Emergency Medicine

## 2016-09-22 ENCOUNTER — Telehealth: Payer: Self-pay | Admitting: *Deleted

## 2016-09-22 DIAGNOSIS — N186 End stage renal disease: Secondary | ICD-10-CM | POA: Diagnosis not present

## 2016-09-22 DIAGNOSIS — R0902 Hypoxemia: Secondary | ICD-10-CM | POA: Diagnosis not present

## 2016-09-22 DIAGNOSIS — Z4682 Encounter for fitting and adjustment of non-vascular catheter: Secondary | ICD-10-CM | POA: Diagnosis not present

## 2016-09-22 DIAGNOSIS — I959 Hypotension, unspecified: Secondary | ICD-10-CM | POA: Insufficient documentation

## 2016-09-22 DIAGNOSIS — I120 Hypertensive chronic kidney disease with stage 5 chronic kidney disease or end stage renal disease: Secondary | ICD-10-CM | POA: Insufficient documentation

## 2016-09-22 DIAGNOSIS — Z87891 Personal history of nicotine dependence: Secondary | ICD-10-CM | POA: Insufficient documentation

## 2016-09-22 DIAGNOSIS — I25118 Atherosclerotic heart disease of native coronary artery with other forms of angina pectoris: Secondary | ICD-10-CM | POA: Diagnosis not present

## 2016-09-22 DIAGNOSIS — Z992 Dependence on renal dialysis: Secondary | ICD-10-CM | POA: Insufficient documentation

## 2016-09-22 DIAGNOSIS — M8000XD Age-related osteoporosis with current pathological fracture, unspecified site, subsequent encounter for fracture with routine healing: Secondary | ICD-10-CM | POA: Diagnosis not present

## 2016-09-22 DIAGNOSIS — I469 Cardiac arrest, cause unspecified: Secondary | ICD-10-CM | POA: Insufficient documentation

## 2016-09-22 DIAGNOSIS — R001 Bradycardia, unspecified: Secondary | ICD-10-CM | POA: Insufficient documentation

## 2016-09-22 DIAGNOSIS — I10 Essential (primary) hypertension: Secondary | ICD-10-CM | POA: Diagnosis not present

## 2016-09-22 DIAGNOSIS — D638 Anemia in other chronic diseases classified elsewhere: Secondary | ICD-10-CM | POA: Diagnosis not present

## 2016-09-22 DIAGNOSIS — Z853 Personal history of malignant neoplasm of breast: Secondary | ICD-10-CM | POA: Insufficient documentation

## 2016-09-22 DIAGNOSIS — I2510 Atherosclerotic heart disease of native coronary artery without angina pectoris: Secondary | ICD-10-CM | POA: Diagnosis not present

## 2016-09-22 DIAGNOSIS — E1122 Type 2 diabetes mellitus with diabetic chronic kidney disease: Secondary | ICD-10-CM | POA: Insufficient documentation

## 2016-09-22 LAB — COMPREHENSIVE METABOLIC PANEL
ALT: 6 U/L — AB (ref 14–54)
AST: 105 U/L — ABNORMAL HIGH (ref 15–41)
Albumin: 1.7 g/dL — ABNORMAL LOW (ref 3.5–5.0)
Alkaline Phosphatase: 94 U/L (ref 38–126)
Anion gap: 13 (ref 5–15)
BUN: 56 mg/dL — ABNORMAL HIGH (ref 6–20)
CHLORIDE: 101 mmol/L (ref 101–111)
CO2: 22 mmol/L (ref 22–32)
CREATININE: 5.68 mg/dL — AB (ref 0.44–1.00)
Calcium: 10.9 mg/dL — ABNORMAL HIGH (ref 8.9–10.3)
GFR, EST AFRICAN AMERICAN: 7 mL/min — AB (ref >60)
GFR, EST NON AFRICAN AMERICAN: 6 mL/min — AB (ref >60)
Glucose, Bld: 209 mg/dL — ABNORMAL HIGH (ref 65–99)
POTASSIUM: 3.7 mmol/L (ref 3.5–5.1)
SODIUM: 136 mmol/L (ref 135–145)
Total Bilirubin: 0.5 mg/dL (ref 0.3–1.2)
Total Protein: 4 g/dL — ABNORMAL LOW (ref 6.5–8.1)

## 2016-09-22 LAB — CBC
HCT: 20.2 % — ABNORMAL LOW (ref 35.0–47.0)
Hemoglobin: 6.4 g/dL — ABNORMAL LOW (ref 12.0–16.0)
MCH: 28.6 pg (ref 26.0–34.0)
MCHC: 31.9 g/dL — ABNORMAL LOW (ref 32.0–36.0)
MCV: 89.5 fL (ref 80.0–100.0)
PLATELETS: 109 10*3/uL — AB (ref 150–440)
RBC: 2.26 MIL/uL — ABNORMAL LOW (ref 3.80–5.20)
RDW: 20.5 % — ABNORMAL HIGH (ref 11.5–14.5)
WBC: 12 10*3/uL — AB (ref 3.6–11.0)

## 2016-09-22 LAB — GLUCOSE, CAPILLARY: Glucose-Capillary: 207 mg/dL — ABNORMAL HIGH (ref 65–99)

## 2016-09-22 LAB — TROPONIN I: Troponin I: 2.68 ng/mL (ref <0.03)

## 2016-09-22 LAB — BRAIN NATRIURETIC PEPTIDE: B Natriuretic Peptide: >4500 pg/mL — ABNORMAL HIGH (ref 0.0–100.0)

## 2016-09-22 LAB — LACTIC ACID, PLASMA: LACTIC ACID, VENOUS: 9.8 mmol/L — AB (ref 0.5–1.9)

## 2016-09-22 MED ORDER — ETOMIDATE 2 MG/ML IV SOLN
INTRAVENOUS | Status: DC | PRN
  Administered 2016-09-22: 20 mg via INTRAVENOUS

## 2016-09-22 MED ORDER — SUCCINYLCHOLINE CHLORIDE 20 MG/ML IJ SOLN
INTRAMUSCULAR | Status: DC | PRN
  Administered 2016-09-22: 100 mg via INTRAVENOUS

## 2016-09-22 MED ORDER — EPINEPHRINE PF 1 MG/10ML IJ SOSY
PREFILLED_SYRINGE | INTRAMUSCULAR | Status: DC | PRN
  Administered 2016-09-22 (×8): 1 via INTRAVENOUS
  Administered 2016-09-22: 0.5 mg via INTRAVENOUS

## 2016-09-22 MED ORDER — ALTEPLASE 100 MG IV SOLR
INTRAVENOUS | Status: AC
  Filled 2016-09-22: qty 100

## 2016-09-22 MED ORDER — ATROPINE SULFATE 1 MG/ML IJ SOLN
INTRAMUSCULAR | Status: DC | PRN
  Administered 2016-09-22: 0.2 mg via INTRAVENOUS
  Administered 2016-09-22 (×2): 1 mg via INTRAVENOUS

## 2016-09-22 MED ORDER — FENTANYL CITRATE (PF) 100 MCG/2ML IJ SOLN
INTRAMUSCULAR | Status: AC
  Filled 2016-09-22: qty 2

## 2016-09-22 MED ORDER — MIDAZOLAM HCL 5 MG/5ML IJ SOLN
INTRAMUSCULAR | Status: AC
  Filled 2016-09-22: qty 10

## 2016-09-22 MED ORDER — CALCIUM CHLORIDE 10 % IV SOLN
INTRAVENOUS | Status: DC | PRN
  Administered 2016-09-22 (×4): 1 g via INTRAVENOUS

## 2016-09-22 MED ORDER — CALCIUM CHLORIDE 10 % IV SOLN
INTRAVENOUS | Status: AC
  Filled 2016-09-22: qty 10

## 2016-09-22 MED ORDER — EPINEPHRINE PF 1 MG/10ML IJ SOSY
PREFILLED_SYRINGE | INTRAMUSCULAR | Status: AC
  Filled 2016-09-22: qty 30

## 2016-09-22 MED ORDER — SODIUM BICARBONATE 8.4 % IV SOLN
INTRAVENOUS | Status: DC | PRN
  Administered 2016-09-22 (×3): 100 meq via INTRAVENOUS

## 2016-09-22 NOTE — Telephone Encounter (Signed)
Lm requesting return call to complete TCM and confirm hosp f/u appt  

## 2016-09-22 NOTE — Code Documentation (Signed)
Organ procurement team notified.

## 2016-09-22 NOTE — ED Triage Notes (Signed)
Pt to ED by EMS from [facility] with decreased LOC. Pt was discharge from [facility] yesterday after hip surgery and sent to [facility] for rehab. Pt found today by staff with decreased LOC. Upon arrival EMS stated the pt's BP was 40/10 with a pulse of 30.

## 2016-09-22 NOTE — ED Notes (Signed)
Patient's neighbors-[name] and [name] took patient's hearing aids home with them.  They reported patient's brother-in-law is patient's next-of-kin/emergency contact--Billy Cowper: [phone]

## 2016-09-22 NOTE — Progress Notes (Signed)
Chaplain received a page to visit with pt in room ED8. Pt expired and Chaplain lead family into the room. Chaplain provided emotional and grief support for family. Chaplain is available for follow up if needed.    09/08/2016 1700  Clinical Encounter Type  Visited With Patient;Patient and family together  Visit Type Initial;Spiritual support  Referral From Nurse  Consult/Referral To Chaplain  Spiritual Encounters  Spiritual Needs Emotional;Grief support

## 2016-09-22 NOTE — ED Provider Notes (Signed)
[facility] Emergency Department Provider Note  ____________________________________________  Time seen: Approximately 4:38 PM  I have reviewed the triage vital signs and the nursing notes.   HISTORY  Chief Complaint No chief complaint on file.  The patient is unable to give any history due to unresponsiveness.  HPI [name] is a 83 y.o. female with a history of CAD, COPD, ESRD on HD, status post left hip arthroplasty 09/18/16, brought from rehabilitation for unresponsiveness.On arrival to the emergency department, the patient was hypotensive with a systolic in the 16X, bradycardic with a heart rate in the 40s, and completely unresponsive with a GCS of 3. Blood sugar was low 200s. The patient was found unresponsive and there is no additional history from EMS.   Past Medical History:  Diagnosis Date  . Anemia in chronic kidney disease    IV iron infusion and anemia from GAVE blood loss.  . Arthritis     hands  . Bradycardia 2014   a. Noted 06/2012.  . Breast cancer ([place])    Left Breast 13 yrs. ago, RADIATION TX DONE  . CAD (coronary artery disease), native coronary artery    a. 03/2015 Cath: LAD 13m, D2 75, LCX 80, OM1 85, RI 99, RCA 70p/m, 95d, AM 85-->poor candidate for CABG & PCI-->Med Rx.  . Cardiac murmur    a. thought due to AVF (2D echo 06/2012 without significant valvular disease).   . Carotid arterial disease ([place])    a. 04/2015 Carotid U/S: 1-39% bilat ICA stenosis.  Marland Kitchen Ectropion of right lower eyelid 11/2012   s/p surgery (Dr. [name])  . End stage renal disease on dialysis [facility])    HD M,W,F [place] AVF now LUE AVF HD X 4 YRS   . Facial droop 1935   acquired during forceps delivery, some residual R visual loss  . Family history of adverse reaction to anesthesia    brother trouble waking up and confusion, now deceased  . GAVE (gastric antral vascular ectasia) 08/2014   s/p EGD with APC  . GERD (gastroesophageal reflux  disease)   . Hearing loss    Bilateral hearing aids  . History of blood transfusion 5 weeks ago  . History of home oxygen therapy    2 LITERS  OXYGEN PRN WHEN WALKING  . Hx of breast cancer 2004  . Hyperlipidemia   . Hypertension   . Hypotension    a. Associated w/ dialysis.  . Moderate mitral regurgitation    a. 05/2015 Echo: EF 55-60%, no rwma, triv AI, Ao sclerosis, mod MR, mildly to mod dil LA, PASP 25mmHg.  . Osteoporosis 11/2013   DEXA -3.4 radius IN BACK  . Sight impaired    Left: wears contact. Right: diminished peripheral vision    Patient Active Problem List   Diagnosis Date Noted  . Protein-calorie malnutrition ([place]) 09/19/2016  . Closed fracture of neck of left femur ([place])   . Congestive heart failure (CHF) ([place])   . S/p left hip fracture 09/16/2016  . Hyperglycemia 09/16/2016  . Hip fracture ([place]) 09/16/2016  . Iron deficiency anemia due to chronic blood loss   . Chronic respiratory failure with hypoxia, on home O2 therapy ([place]) 06/23/2016  . Heme positive stool   . Chronic midline low back pain without sciatica 02/26/2016  . Osteoporosis 12/24/2015  . Forehead laceration 12/16/2015  . Fall 12/16/2015  . Advanced care planning/counseling discussion 11/06/2015  . Moderate to severe pulmonary hypertension ([place]) 06/13/2015  .  Abnormal chest x-ray 06/13/2015  . CAD-severe multivessel, not CABG candidate 05/06/2015  . Abnormal nuclear stress test   . Abnormal cardiovascular function study 03/06/2015  . Demand ischemia ([place])   . Acute on chronic diastolic congestive heart failure ([place]) 09/23/2014  . Exertional dyspnea   . NSTEMI (non-ST elevated myocardial infarction) ([place])   . GAVE (gastric antral vascular ectasia) 04/09/2014  . Anemia due to blood loss, chronic 04/08/2014  . Upper GI bleed   . Right shoulder injury 03/12/2014  . Left foot pain 01/10/2014  . Nasal congestion 11/13/2013  . Osteoarthritis, hand 11/13/2013  . Imbalance 11/16/2012  . Abnormal  electrocardiogram 07/25/2012  . Mood disorder ([place]) 06/01/2012  . Systolic murmur 16/12/9602  . History of diabetes mellitus, type II   . Hyperlipidemia   . Hypertension   . Chronic Facial droop   . ESRD on dialysis ([place])   . GERD (gastroesophageal reflux disease)     Past Surgical History:  Procedure Laterality Date  . ANTERIOR APPROACH HEMI HIP ARTHROPLASTY Left 09/18/2016   Procedure: ANTERIOR APPROACH HEMI HIP ARTHROPLASTY;  Surgeon: Mcarthur Rossetti, MD;  Location: [place];  Service: Orthopedics;  Laterality: Left;  . AV FISTULA PLACEMENT  07/20/10   Right brachiocephalic AVF  . BASCILIC VEIN TRANSPOSITION Left 07/24/2013   Procedure: BASILIC VEIN TRANSPOSITION;  Surgeon: [name], MD;  Location: [place];  Service: Vascular;  Laterality: Left;  . BREAST BIOPSY Left   . BREAST LUMPECTOMY Left   . CARDIAC CATHETERIZATION N/A 04/10/2015   Procedure: Left Heart Cath and Coronary Angiography;  Surgeon: [name], MD;  Location: [place] [facility];  Service: Cardiovascular;  Laterality: N/A;  . CATARACT EXTRACTION Bilateral 1980  . DEXA  11/2015   DEXA T -4.3 [facility]  . ESOPHAGOGASTRODUODENOSCOPY N/A 04/09/2014   Procedure: ESOPHAGOGASTRODUODENOSCOPY (EGD);  Surgeon: [name], MD;  Location: [place];  Service: Endoscopy;  Laterality: N/A;  . ESOPHAGOGASTRODUODENOSCOPY N/A 09/23/2014   Procedure: ESOPHAGOGASTRODUODENOSCOPY (EGD);  Surgeon: [name], MD;  Location: [place];  Service: Endoscopy;  Laterality: N/A;  . ESOPHAGOGASTRODUODENOSCOPY N/A 12/26/2014   neg H pylori, benign biopsies; ESOPHAGOGASTRODUODENOSCOPY (EGD);  Surgeon: [name], MD  . ESOPHAGOGASTRODUODENOSCOPY N/A 06/15/2015   Procedure: ESOPHAGOGASTRODUODENOSCOPY (EGD);  Surgeon: Milus Banister, MD;  Location: [place];  Service: Endoscopy;  Laterality: N/A;  . ESOPHAGOGASTRODUODENOSCOPY N/A 06/27/2015   Procedure: ESOPHAGOGASTRODUODENOSCOPY (EGD);  Surgeon: Mauri Pole,  MD;  [facility];  Service: Endoscopy;  Laterality: N/A;  . ESOPHAGOGASTRODUODENOSCOPY (EGD) WITH PROPOFOL N/A 11/12/2014   Procedure: ESOPHAGOGASTRODUODENOSCOPY (EGD) WITH PROPOFOL;  Surgeon: [name], MD;  Location: [facility];  Service: Endoscopy;  Laterality: N/A;  . ESOPHAGOGASTRODUODENOSCOPY (EGD) WITH PROPOFOL N/A 07/31/2015   Procedure: ESOPHAGOGASTRODUODENOSCOPY (EGD) WITH PROPOFOL;  Surgeon: Mauri Pole, MD;  Location: [facility];  Service: Endoscopy;  Laterality: N/A;  . ESOPHAGOGASTRODUODENOSCOPY (EGD) WITH PROPOFOL N/A 03/08/2016   Procedure: ESOPHAGOGASTRODUODENOSCOPY (EGD) WITH PROPOFOL;  Surgeon: Mauri Pole, MD;  Location: [facility];  Service: Endoscopy;  Laterality: N/A;  . ESOPHAGOGASTRODUODENOSCOPY (EGD) WITH PROPOFOL N/A 07/01/2016   Procedure: ESOPHAGOGASTRODUODENOSCOPY (EGD) WITH PROPOFOL;  Surgeon: Mauri Pole, MD;  Location: [facility];  Service: Endoscopy;  Laterality: N/A;  . EYE SURGERY  1966   regular cataract  . HERNIA REPAIR    . HOT HEMOSTASIS N/A 11/12/2014   Procedure: HOT HEMOSTASIS (ARGON PLASMA COAGULATION/BICAP);  Surgeon: [name], MD;  [facility];  Service: Endoscopy;  Laterality:  N/A;  . HOT HEMOSTASIS N/A 06/27/2015   Procedure: HOT HEMOSTASIS (ARGON PLASMA COAGULATION/BICAP);  Surgeon: Mauri Pole, MD;  [facility];  Service: Endoscopy;  Laterality: N/A;  . HOT HEMOSTASIS N/A 07/31/2015   Procedure: HOT HEMOSTASIS (ARGON PLASMA COAGULATION/BICAP);  Surgeon: Mauri Pole, MD;  Location[facility];  Service: Endoscopy;  Laterality: N/A;  . HOT HEMOSTASIS N/A 07/01/2016   Procedure: HOT HEMOSTASIS (ARGON PLASMA COAGULATION/BICAP);  Surgeon: Mauri Pole, MD;  Location[facility];  Service: Endoscopy;  Laterality: N/A;  . PERIPHERAL VASCULAR CATHETERIZATION N/A 08/15/2014   Procedure: A/V Shuntogram/Fistulagram;  Surgeon: [name], MD;  Location: [place] [facility];  Service: Cardiovascular;  Laterality: N/A;  . PERIPHERAL VASCULAR CATHETERIZATION N/A 04/28/2015   Procedure: A/V Shuntogram/Fistulagram;  Surgeon: [name], MD;  Location: [place] [facility];  Service: Cardiovascular;  Laterality: N/A;  . PERIPHERAL VASCULAR CATHETERIZATION Left 04/28/2015   Procedure: A/V Shunt Intervention;  Surgeon: [name], MD;  Location: [place] [facility];  Service: Cardiovascular;  Laterality: Left;  . PERIPHERAL VASCULAR CATHETERIZATION Left 08/07/2015   Procedure: A/V Shuntogram/Fistulagram;  Surgeon: [name], MD;  Location: [place] [facility];  Service: Cardiovascular;  Laterality: Left;  . PERIPHERAL VASCULAR CATHETERIZATION N/A 08/07/2015   Procedure: A/V Shunt Intervention;  Surgeon: [name], MD;  Location: [place] [facility];  Service: Cardiovascular;  Laterality: N/A;  . PERIPHERAL VASCULAR CATHETERIZATION Left 11/03/2015   Procedure: A/V Shuntogram/Fistulagram;  Surgeon: [name], MD;  Location: [place] [facility];  Service: Cardiovascular;  Laterality: Left;  . PERIPHERAL VASCULAR CATHETERIZATION N/A 11/03/2015   Procedure: A/V Shunt Intervention;  Surgeon: [name], MD;  Location: [place] [facility];  Service: Cardiovascular;  Laterality: N/A;  . PERIPHERAL VASCULAR CATHETERIZATION N/A 03/19/2016   Procedure: A/V Fistulagram;  Surgeon: Katha Cabal, MD;  Location: [place] [facility];  Service: Cardiovascular;  Laterality: N/A;  . PERIPHERAL VASCULAR CATHETERIZATION N/A 03/30/2016   Procedure: A/V Fistulagram;  Surgeon: Katha Cabal, MD;  Location: [place] [facility];  Service: Cardiovascular;  Laterality: N/A;  . PERIPHERAL VASCULAR CATHETERIZATION N/A 03/30/2016   Procedure: A/V Shunt Intervention;  Surgeon: Katha Cabal, MD;  Location: [place] [facility];  Service: Cardiovascular;  Laterality: N/A;  . SHUNTOGRAM N/A 04/16/2011   Procedure: [place];  Surgeon: [name], MD;  Location: [facility] C[facility];   Service: Cardiovascular;  Laterality: N/A;  . SHUNTOGRAM Right 07/05/2013   Procedure: FISTULOGRAM;  Surgeon: [name], MD;  Location: [facility] C[facility] LAB;  Service: Cardiovascular;  Laterality: Right;  . UMBILICAL HERNIA REPAIR  2011  . US ECHOCARDIOGRAPHY  06/2012   LVH, EF 65%, grade 1 diastol dysfunction, mod dliated LAD    Current Outpatient Rx  . Order #: [id number] Class: Historical Med  . Order #: [id number] Class: Normal  . Order #: [id number] Class: No Print  . Order #: [id number] Class: Normal  . Order #: [id number] Class: Historical Med  . Order #: [id number] Class: Normal  . Order #: [id number] Class: Historical Med  . Order #: [id number] Class: Normal  . Order #: [id number] Class: Historical Med  . Order #: [id number] Class: Normal  . Order #: [id number] Class: Print  . Order #: [id number] Class: Normal  . Order #: [id number] Class: Normal  . Order #: [id number] Class: Historical Med  . Order #: [id number] Class: Normal  . Order #: [id number] Class: Normal  . Order #: [id number] Class: Historical Med  . Order #: [id number] Class: Normal  . Order #: [id number] Class:  Print    Allergies Sulfa antibiotics  Family History  Problem Relation Age of Onset  . Cancer Father        stomach  . CAD Father        MI at age 36  . Heart disease Father   . Heart attack Father   . Hypertension Mother   . Heart disease Brother        Congenital  . Hypertension Brother   . Diabetes Brother   . Cancer Sister        female (uterus?)  . Colon cancer Neg Hx   . Esophageal cancer Neg Hx   . Stomach cancer Neg Hx     Social History Social History  Substance Use Topics  . Smoking status: Former Smoker    Years: 1.00    Types: Cigarettes    Quit date: 03/29/1953  . Smokeless tobacco: Never Used  . Alcohol use 0.0 oz/week     Comment: occasional    Review of Systems Unable to obtain due to patient being unresponsive.  ____________________________________________   PHYSICAL EXAM:  VITAL SIGNS: ED  Triage Vitals  Enc Vitals Group     BP 09/23/2016 1443 (!) 84/46     Pulse Rate 09/02/2016 1500 (!) 46     Resp 08/31/2016 1500 20     Temp --      Temp src --      SpO2 09/16/2016 1500 (!) 84 %     Weight --      Height --      Head Circumference --      Peak Flow --      Pain Score --      Pain Loc --      Pain Edu? --      Excl. in [place]? --     Constitutional: The patient is unresponsive. GCS is 3. Eyes: Conjunctivae are normal.  Pupils are 4 mm and fixed. No scleral icterus. Head: Atraumatic. Nose: No congestion/rhinnorhea. Mouth/Throat: Mucous membranes are dry.  Neck: No stridor.  Supple.   Cardiovascular: Bradycardic without any obvious murmurs rubs or gallops. Heart rate as low as 28.  Respiratory: Low respiratory rate with O2 sats between the 40s and 60s. Gastrointestinal: Soft, nontender and nondistended.  No guarding or rebound.  No peritoneal signs. Musculoskeletal: No LE edema. No ttp in the calves or palpable cords.  Negative Homan's sign. Neurologic: the patient is unresponsive. She has no gag reflex. She does not make any purposeful movement. Her pupils are fixed and symmetric. Skin:  Skin is warm. The patient has a bandage with an incisional site over the left hip  ____________________________________________   LABS (all labs ordered are listed, but only abnormal results are displayed)  Labs Reviewed  COMPREHENSIVE METABOLIC PANEL - Abnormal; Notable for the following:       Result Value   Glucose, Bld 209 (*)    BUN 56 (*)    Creatinine, Ser 5.68 (*)    Calcium 10.9 (*)    Total Protein 4.0 (*)    Albumin 1.7 (*)    AST 105 (*)    ALT 6 (*)    GFR calc non Af Amer 6 (*)    GFR calc Af Amer 7 (*)    All other components within normal limits  CBC - Abnormal; Notable for the following:    WBC 12.0 (*)    RBC 2.26 (*)    Hemoglobin 6.4 (*)  HCT 20.2 (*)    MCHC 31.9 (*)    RDW 20.5 (*)    Platelets 109 (*)    All other components within normal limits   TROPONIN I - Abnormal; Notable for the following:    Troponin I 2.68 (*)    All other components within normal limits  BRAIN NATRIURETIC PEPTIDE - Abnormal; Notable for the following:    B Natriuretic Peptide >4,500.0 (*)    All other components within normal limits  LACTIC ACID, PLASMA - Abnormal; Notable for the following:    Lactic Acid, Venous 9.8 (*)    All other components within normal limits  GLUCOSE, CAPILLARY - Abnormal; Notable for the following:    Glucose-Capillary 207 (*)    All other components within normal limits  URINE CULTURE  CULTURE, BLOOD (ROUTINE X 2)  CULTURE, BLOOD (ROUTINE X 2)  URINALYSIS, COMPLETE (UACMP) WITH MICROSCOPIC  BLOOD GAS, ARTERIAL  LACTIC ACID, PLASMA   ____________________________________________  EKG  ED ECG REPORT I, Eula Listen, the attending physician, personally viewed and interpreted this ECG.   Date: 08/27/2016  EKG Time: 1523  Rate: 97  Rhythm: possible afib versus irregular sinus rhythm; poor baseline tracing  Axis: normal  Intervals:none  ST&T Change: No STEMI although patient does appear to have some ST depression in V3 through V6.  ED ECG REPORT I, Eula Listen, the attending physician, personally viewed and interpreted this ECG.   Date: 08/31/2016  EKG Time: 1613  Rate: 106  Rhythm: afib with tachycardia  Axis: normal  Intervals:none  ST&T Change: No STEMI  ____________________________________________  RADIOLOGY  Dg Chest Portable 1 View  Result Date: 09/14/2016 CLINICAL DATA:  83 year old female status post cardiac arrest. Intubated. EXAM: PORTABLE CHEST 1 VIEW COMPARISON:  09/16/2016 and earlier. FINDINGS: Portable AP semi upright view at 1513 hours. Endotracheal tube tip is just above the carina. This could be retracted 2 cm for more optimal placement. Pacer or resuscitation pads project over the lower chest. Larger lung volumes. Less elevation of the right hemidiaphragm. Stable  cardiomegaly and mediastinal contours. Calcified aortic atherosclerosis. No pneumothorax or pulmonary edema. No definite pleural effusion or consolidation. Multiple left upper extremity vascular stents again noted. IMPRESSION: 1. ET tube tip just above the carina. Retract up to 2 cm for more optimal placement. 2. Larger lung volumes.  No acute cardiopulmonary abnormality. Electronically Signed   By: Genevie Ann M.D.   On: 09/12/2016 15:46    ____________________________________________   PROCEDURES  Procedure(s) performed: Yes,, see procedure note(s).  Procedures  Critical Care performed: Yes, see critical care note(s) ____________________________________________   INITIAL IMPRESSION / ASSESSMENT AND PLAN / ED COURSE  Pertinent labs & imaging results that were available during my care of the patient were reviewed by me and considered in my medical decision making (see chart for details).  83 y.o. female who presents to the emergency department hypoxic, hypotensive, bradycardic, and unresponsive. The patient was immediately given atropine, and then received RSI for intubation. She did have good chest rise bilaterally, as well as breath sounds bilaterally, but continued to have oxygen saturations between the 50s and the 80s despite being on 100% FiO2. She received calcium, bicarbonate, and epinephrine. She had multiple episodes where she regained pulses, but then CPR had to be reinitiated when she would become bradycardic and no pulse could be palpable. Intermittently, she did obtain oxygen saturations to the mid 90s, but mostly had persistent hypoxia. Her clinical picture was very concerning for pulmonary embolus. I  spoke with the critical care physician on-call, and both of [place] considered TPA for unstable PE. I was concerned that there was blood in her ET tube, blood coming from her urinary catheter, and her recent surgery of the left hip which are contraindications, however unstable cardiopulmonary  arrest with very high suspicion for PE. Given her impending death, we decided it was not unreasonable to give a half dose of TPA, but while we were preparing this, we did reach the POA, who asked [place] to discontinue any life-saving measures. CPR was stopped, the patient was no longer ventilated, and her time of death was 4:29 PM. I spoke with her 2 friends, who were waiting for her in the emergency department and understood what had happened. I also attempted to call the POA back, but got his voicemail and the patient's friends did say they would continue to call him.  CRITICAL CARE Performed by: Eula Listen   Total critical care time: 90 minutes  Critical care time was exclusive of separately billable procedures and treating other patients.  Critical care was necessary to treat or prevent imminent or life-threatening deterioration.  Critical care was time spent personally by me on the following activities: development of treatment plan with patient and/or surrogate as well as nursing, discussions with consultants, evaluation of patient's response to treatment, examination of patient, obtaining history from patient or surrogate, ordering and performing treatments and interventions, ordering and review of laboratory studies, ordering and review of radiographic studies, pulse oximetry and re-evaluation of patient's condition.  INTUBATION Performed by: Eula Listen  Required items: required blood products, implants, devices, and special equipment available Patient identity confirmed: provided demographic data and hospital-assigned identification number Time out: Immediately prior to procedure a "time out" was called to verify the correct patient, procedure, equipment, support staff and site/side marked as required.  Indications: GCS 3; cardiopulmonary arrest  Intubation method: Direct Laryngoscopy   Preoxygenation: attempted with BVM but O2 sats persistently low  Sedatives:  20mg  Etomidate Paralytic: 100mg  Succinylcholine  Tube Size: 7.5 cuffed  Post-procedure assessment: chest rise Breath sounds: equal and absent over the epigastrium Tube secured with: ETT holder Chest x-ray interpreted by me.  Chest x-ray findings: endotracheal tube in appropriate position 1cm above carina; I did ask respiratory therapy to pull it back 1-2 cm.  Patient tolerated the procedure well with no immediate complications.   CENTRAL LINE Performed by: Eula Listen Consent: The procedure was performed in an emergent situation. Required items: required blood products, implants, devices, and special equipment available Patient identity confirmed: arm band and provided demographic data Time out: Immediately prior to procedure a "time out" was called to verify the correct patient, procedure, equipment, support staff and site/side marked as required. Indications: vascular access Anesthesia: local infiltration Local anesthetic: none Patient sedated: no Preparation: skin prepped with 2% chlorhexidine Skin prep agent dried: skin prep agent completely dried prior to procedure Sterile barriers: all five maximum sterile barriers used - cap, mask, sterile gown, sterile gloves, and large sterile sheet Hand hygiene: hand hygiene performed prior to central venous catheter insertion  Location details: right femoral  Catheter type: triple lumen Catheter size: 8 Fr Pre-procedure: landmarks identified Ultrasound guidance: no Successful placement: yes Post-procedure: line sutured and dressing applied Assessment: blood return through all parts, free fluid flow  Patient tolerance: Patient tolerated the procedure well with no immediate complications.  Cardiopulmonary Resuscitation (CPR) Procedure Note Directed/Performed by: Eula Listen, ED techs and PA student I personally directed ancillary staff and/or performed  CPR in an effort to regain return of spontaneous circulation  and to maintain cardiac, neuro and systemic perfusion.    ____________________________________________  FINAL CLINICAL IMPRESSION(S) / ED DIAGNOSES  Final diagnoses:  Cardiopulmonary arrest ([place])  Hypoxia         NEW MEDICATIONS STARTED DURING THIS VISIT:  New Prescriptions   No medications on file      Eula Listen, MD 09/07/2016 1816

## 2016-09-22 NOTE — Progress Notes (Signed)
[place] Progress Note Patient Name: [name] DOB: 07/21/1931 MRN: [id number]   Date of Service  08/29/2016  HPI/Events of Note  D/w ED physician, pt presented acutely intubated for severe hypoxia and noted to be hypotensive.  Had recent hip replacement but pt now in cardiac arrest. On discussing with ED, PE is highly suspected.   eICU Interventions  Agree with salvage TPA during code on emergent basis, bolus initial half dose, then remaining as infusion.      Intervention Category Major Interventions: Hypotension - evaluation and management  Laverle Hobby 09/17/2016, 3:54 PM

## 2016-09-23 NOTE — Telephone Encounter (Signed)
Lm requesting return call to complete TCM and confirm hosp f/u appt  

## 2016-09-26 DEATH — deceased

## 2016-09-27 LAB — CULTURE, BLOOD (ROUTINE X 2)
Culture: NO GROWTH
SPECIAL REQUESTS: ADEQUATE

## 2016-10-06 ENCOUNTER — Ambulatory Visit: Payer: Medicare Other | Admitting: Cardiology

## 2016-10-21 ENCOUNTER — Encounter (INDEPENDENT_AMBULATORY_CARE_PROVIDER_SITE_OTHER): Payer: Medicare Other

## 2016-10-21 ENCOUNTER — Ambulatory Visit (INDEPENDENT_AMBULATORY_CARE_PROVIDER_SITE_OTHER): Payer: Self-pay | Admitting: Vascular Surgery

## 2016-10-28 ENCOUNTER — Ambulatory Visit: Payer: Medicare Other | Admitting: Family Medicine

## 2017-03-29 IMAGING — CR DG CHEST 1V PORT
1 series · 1 of 1 positions shown · non-contrast
Comparison: Chest radiograph October 23, 2013

CLINICAL DATA: Shortness of breath and fatigue. History of
hypertension, dialysis patient. History of breast cancer.

EXAM:
PORTABLE CHEST - 1 VIEW

[AP]
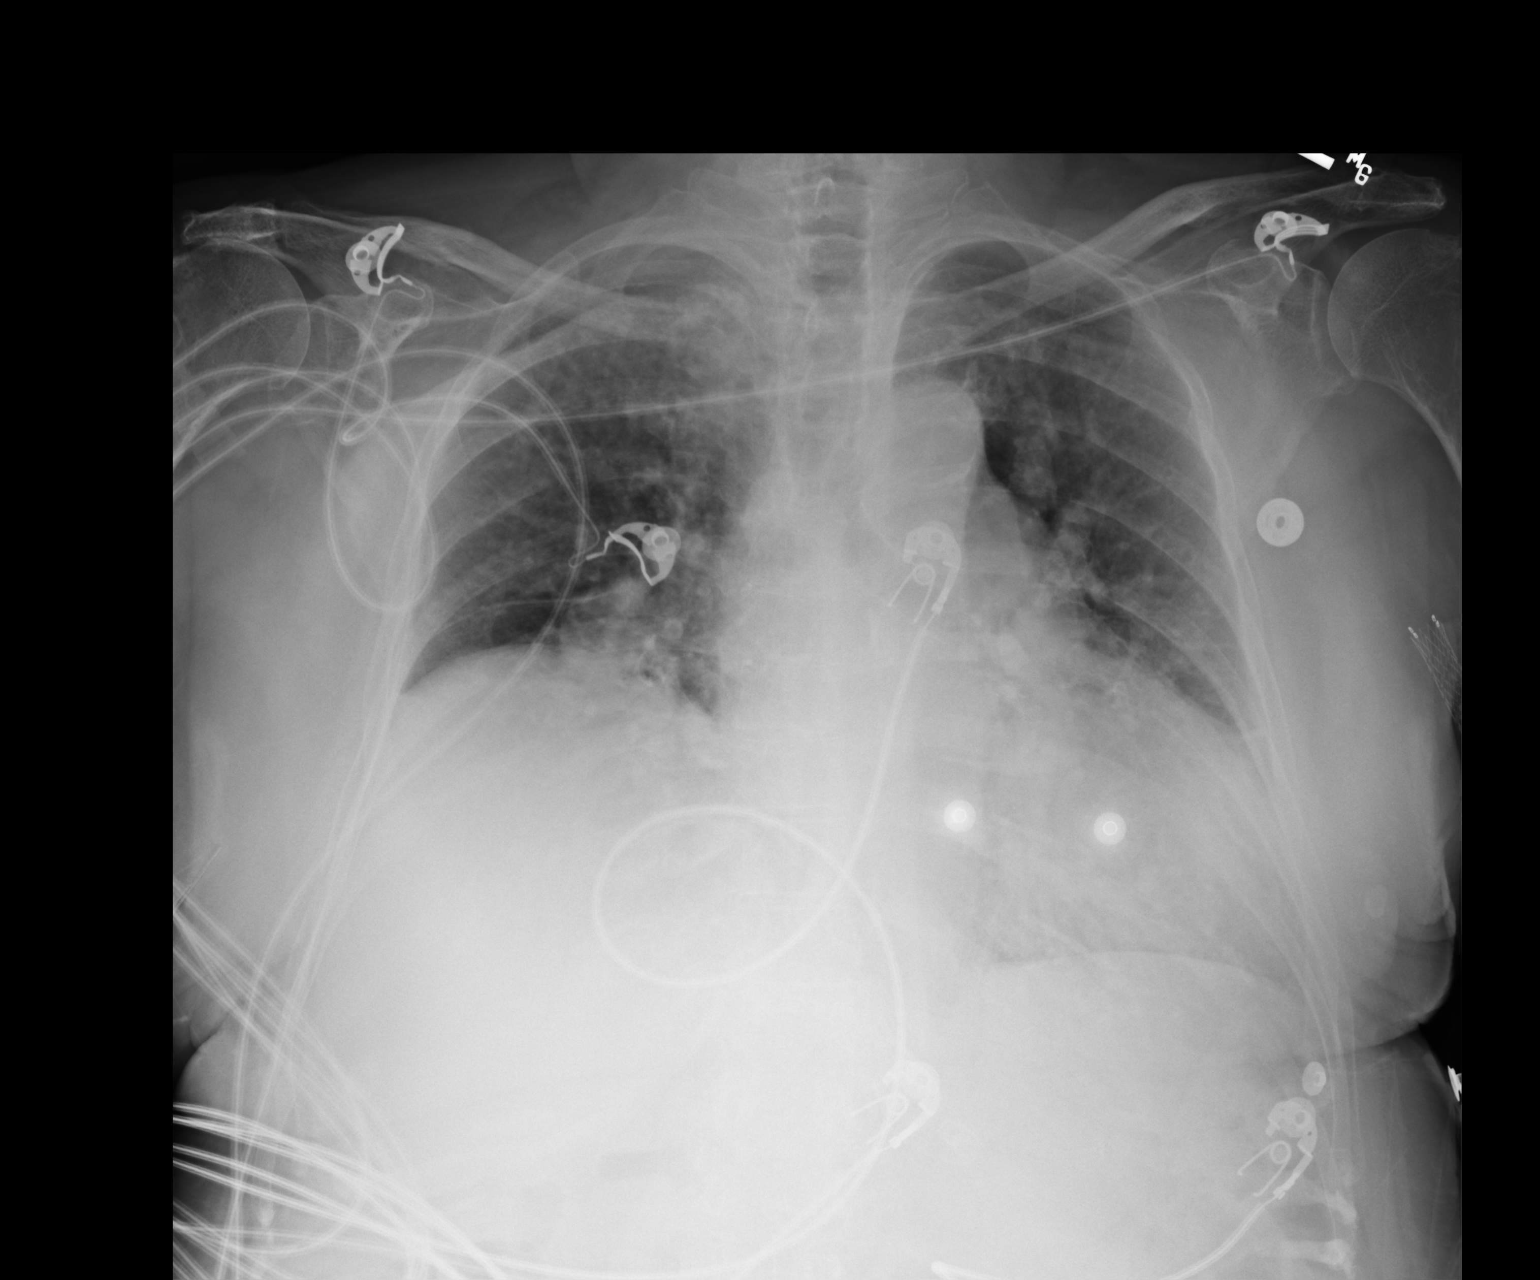

[1 of 1 positions shown; findings below may reference images not displayed]

FINDINGS: The cardiac silhouette appears mildly enlarged. Mediastinal
silhouette is nonsuspicious, mildly calcified aortic knob.
Persistently elevated RIGHT hemidiaphragm. Rounded density projects
in RIGHT middle lobe, and was present previously. Mild interstitial
prominence, pulmonary vascular congestion without pleural effusion.
No pneumothorax. Soft tissue planes and included osseous structures
are nonsuspicious. Vascular stent LEFT upper extremity. Coarse
calcifications project in LEFT breast.
IMPRESSION: Mild cardiomegaly. Increasing interstitial prominence likely
represents pulmonary edema, less likely atypical infection.

Rounded nodular density projecting RIGHT middle lobe may reflect
prominent vessel, similar.

## 2018-04-28 IMAGING — RF DG C-ARM 61-120 MIN
1 series · 4 of 4 positions shown · non-contrast
Comparison: 09/16/2016

CLINICAL DATA: 84-year-old female with left hip fracture fixation

EXAM:
OPERATIVE LEFT HIP (WITH PELVIS IF PERFORMED) 2 VIEWS
TECHNIQUE: Fluoroscopic spot image(s) were submitted for interpretation
post-operatively.

[Series 1: run · 4 of 4 slices shown]
[im 1/4]
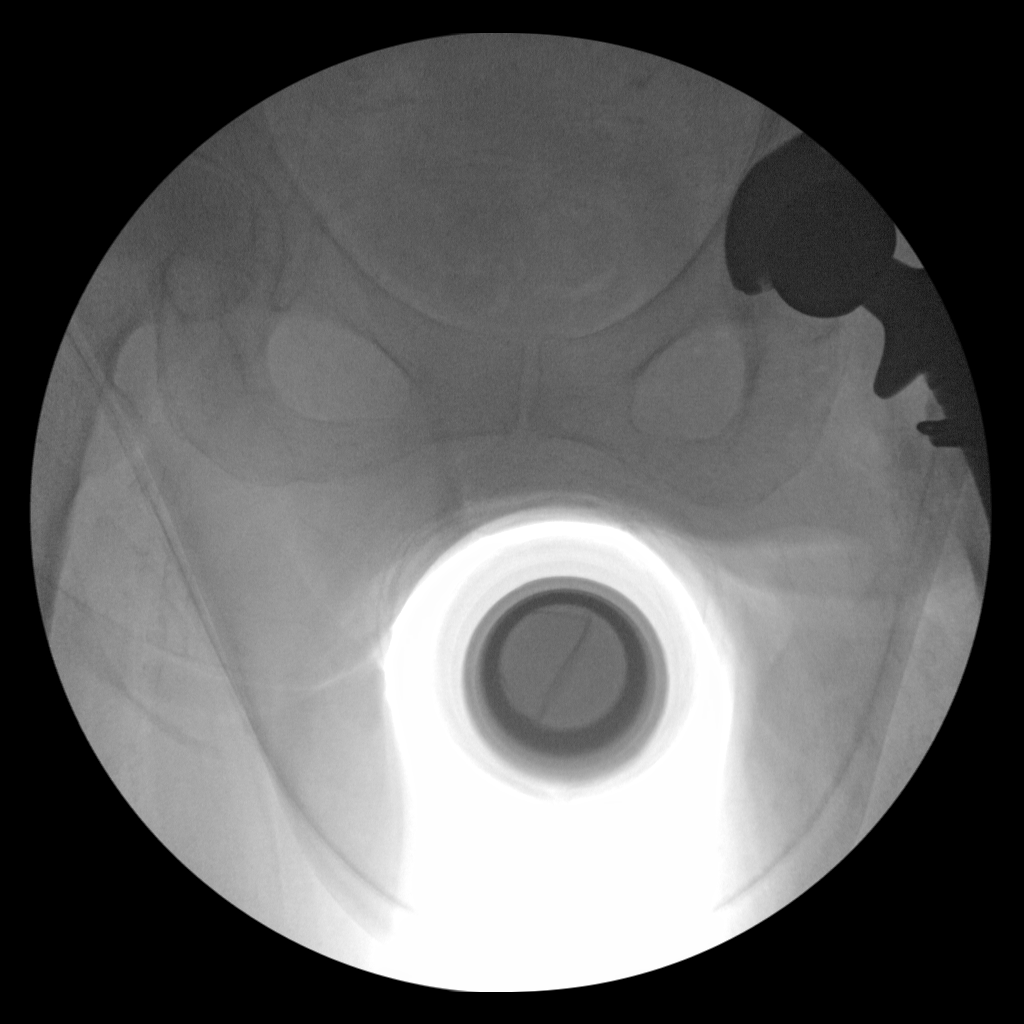
[im 2/4]
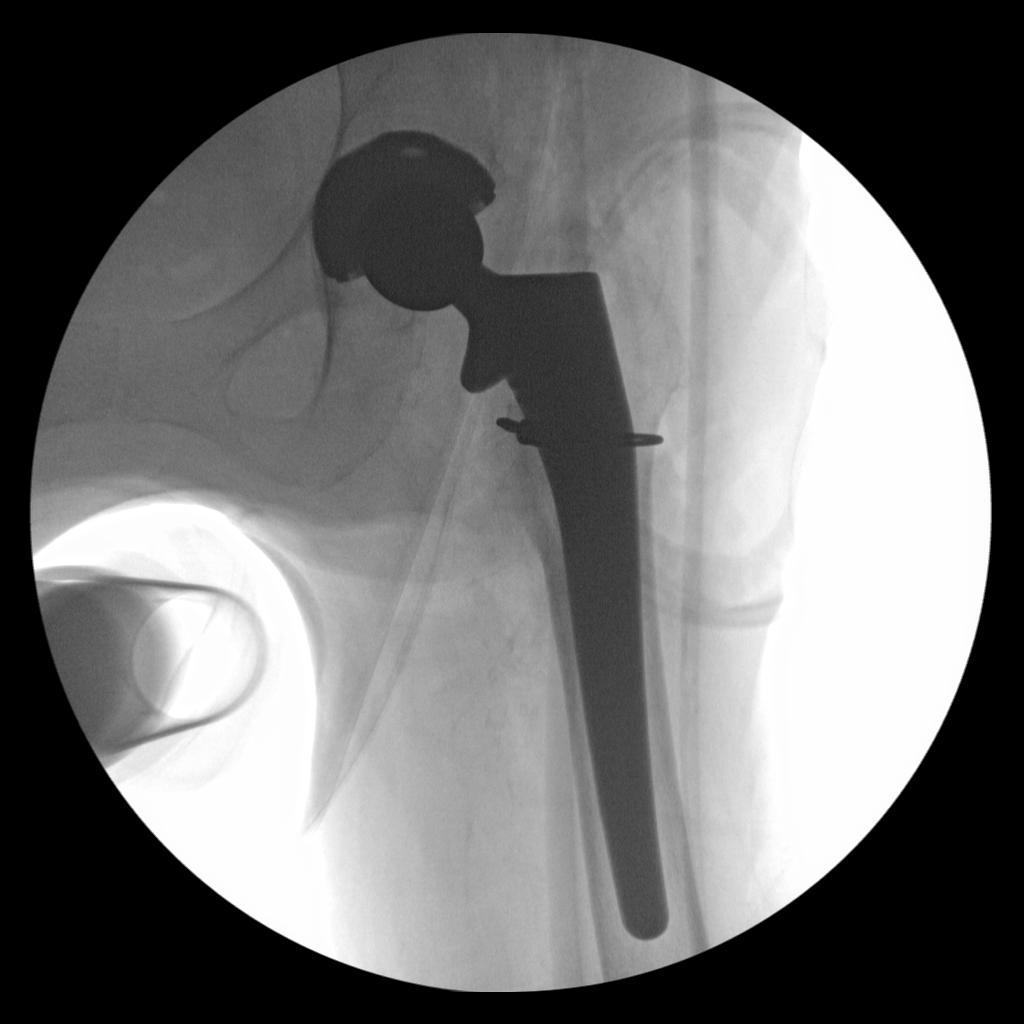
[im 3/4]
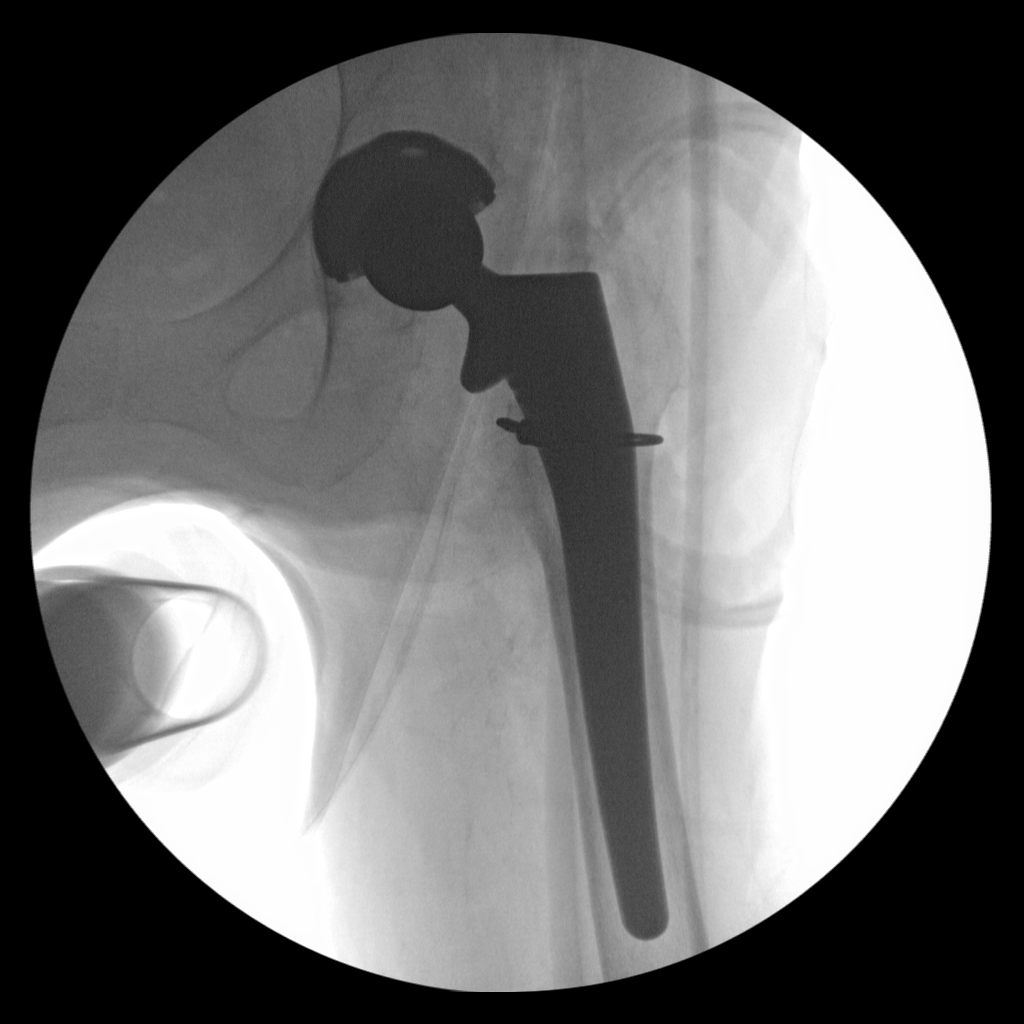
[im 4/4]
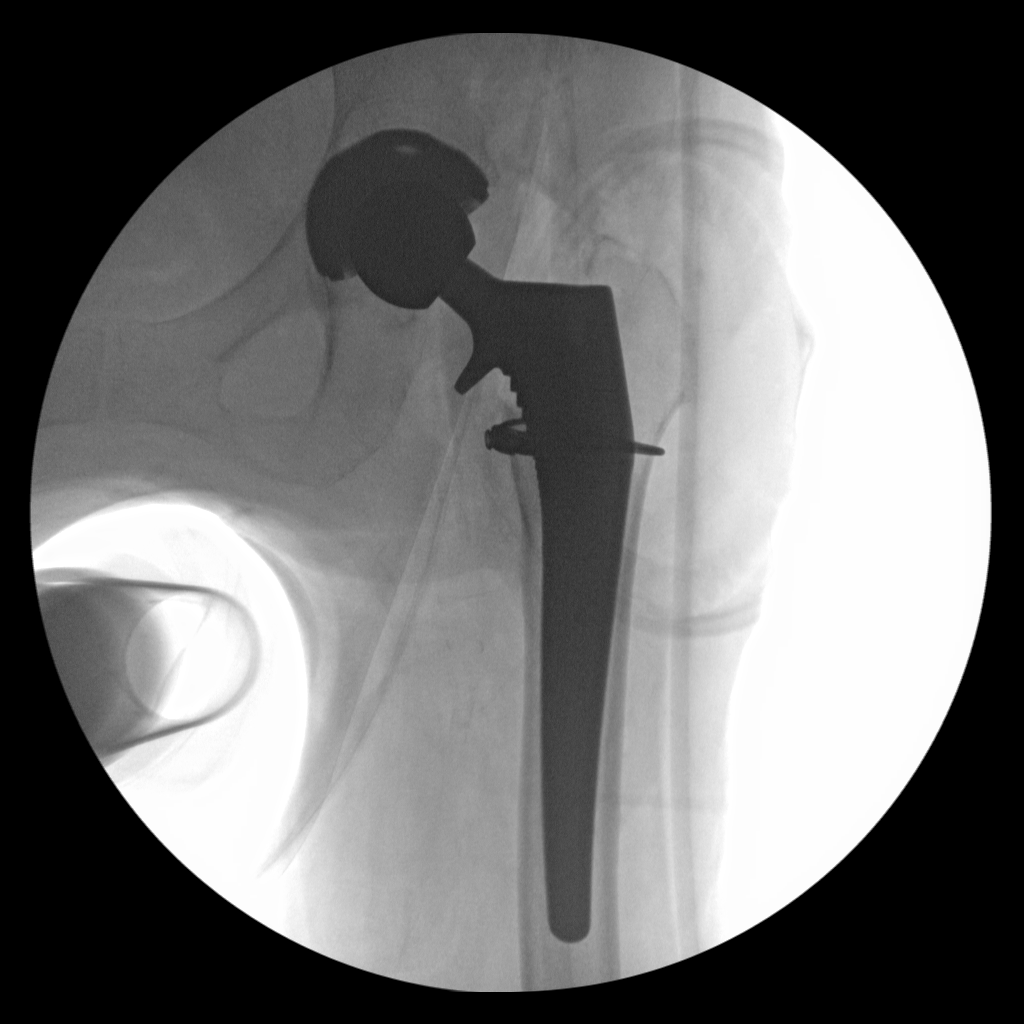

[4 of 4 positions shown; findings below may reference images not displayed]

FINDINGS: Intraoperative fluoroscopic spot images of left hip fracture repair.

The acetabular and femoral components appear congruent, with single
or cerclage wire at the proximal aspect of the left femur/ femoral
stem. Gas within the soft tissues.
IMPRESSION: Intraoperative images of left hip arthroplasty for fracture repair,
with no immediate complicating features. Please refer to the
dictated operative report for full details of intraoperative
findings and procedure.

## 2018-05-02 IMAGING — DX DG CHEST 1V PORT
1 series · 1 of 1 positions shown · non-contrast
Comparison: 09/16/2016 and earlier.

CLINICAL DATA: 84-year-old female status post cardiac arrest.
Intubated.

EXAM:
PORTABLE CHEST 1 VIEW

[chest ap]
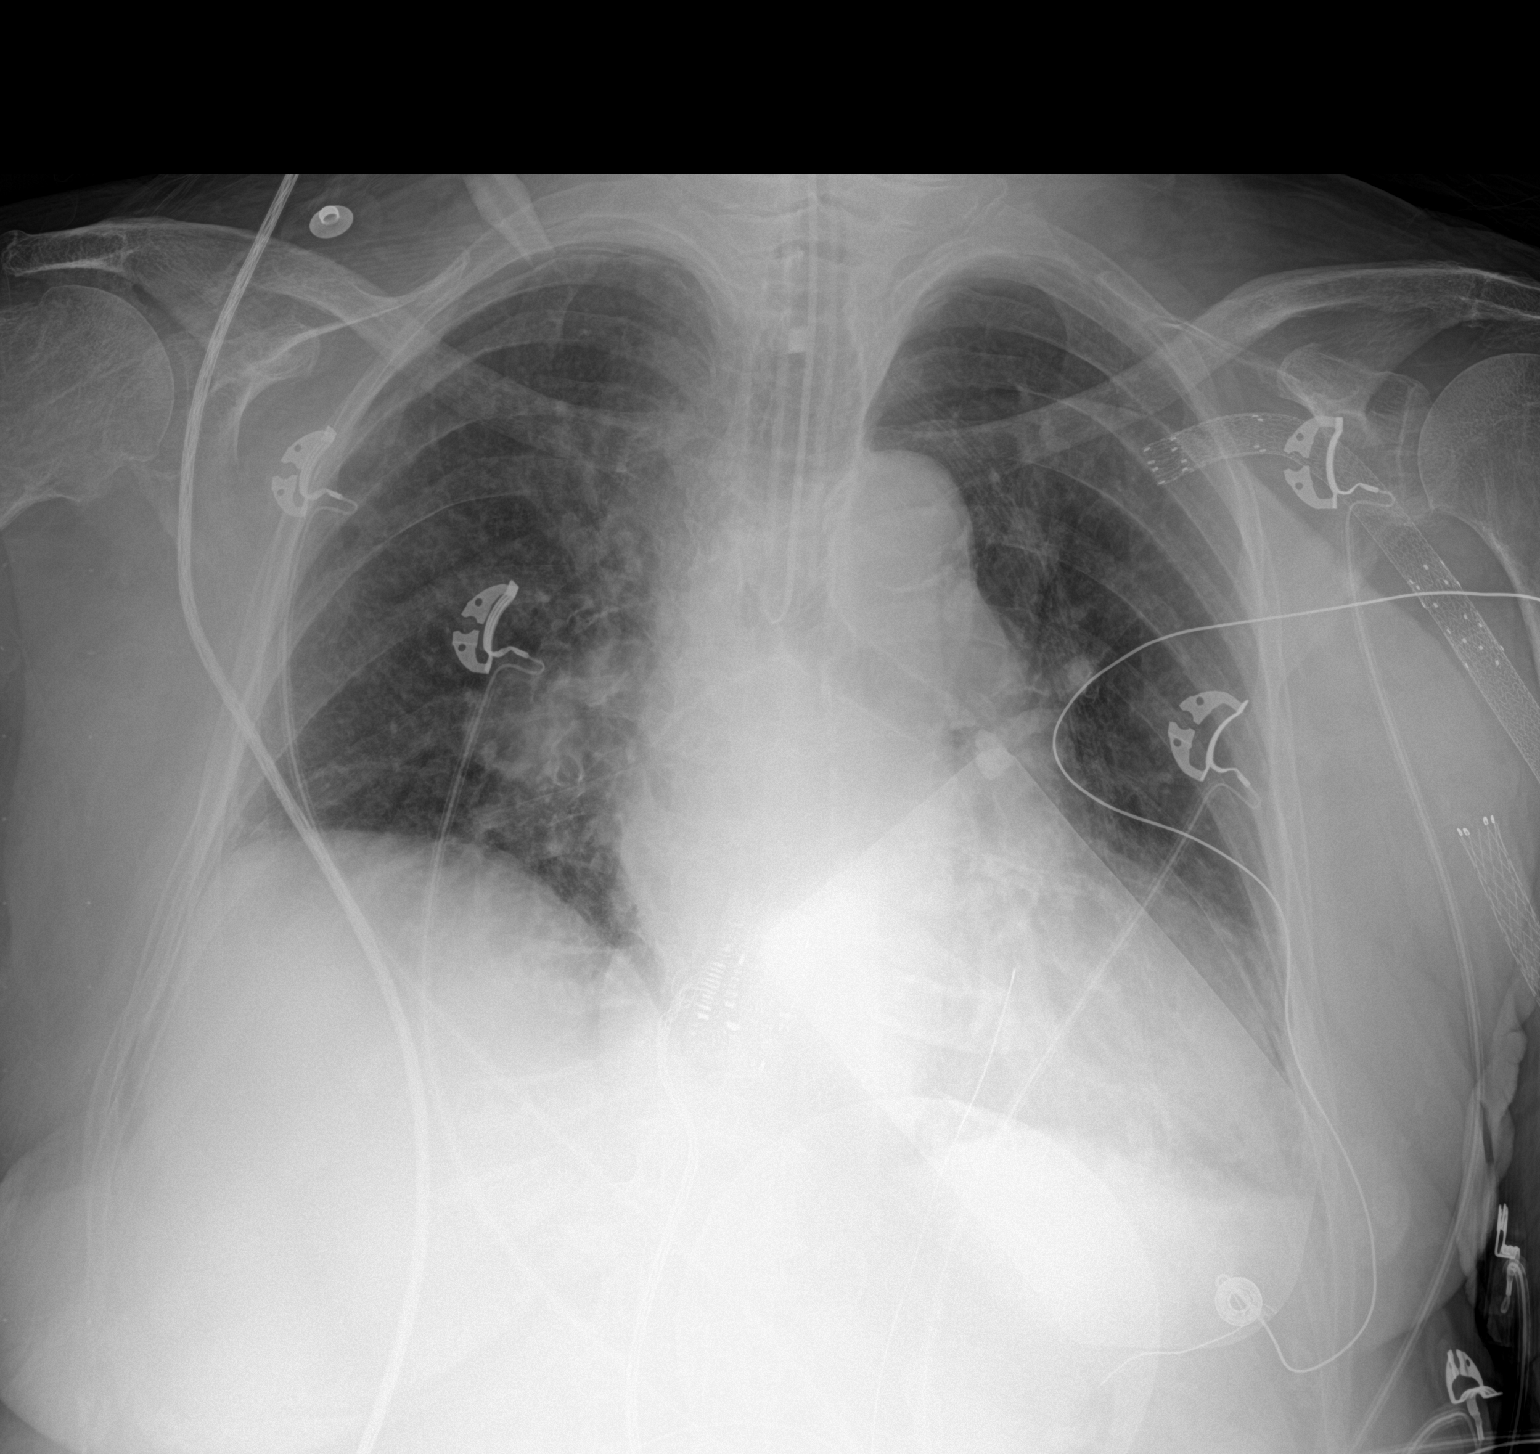

[1 of 1 positions shown; findings below may reference images not displayed]

FINDINGS: Portable AP semi upright view at 9195 hours. Endotracheal tube tip
is just above the carina. This could be retracted 2 cm for more
optimal placement. Pacer or resuscitation pads project over the
lower chest.

Larger lung volumes. Less elevation of the right hemidiaphragm.
Stable cardiomegaly and mediastinal contours. Calcified aortic
atherosclerosis. No pneumothorax or pulmonary edema. No definite
pleural effusion or consolidation. Multiple left upper extremity
vascular stents again noted.
IMPRESSION: 1. ET tube tip just above the carina. Retract up to 2 cm for more
optimal placement.
2. Larger lung volumes.  No acute cardiopulmonary abnormality.

## 2018-09-02 IMAGING — DX DG LUMBAR SPINE COMPLETE 4+V
5 series · 5 of 5 positions shown · non-contrast
Comparison: None.

CLINICAL DATA: Low back pain

EXAM:
LUMBAR SPINE - COMPLETE 4+ VIEW

[l-spine ap]
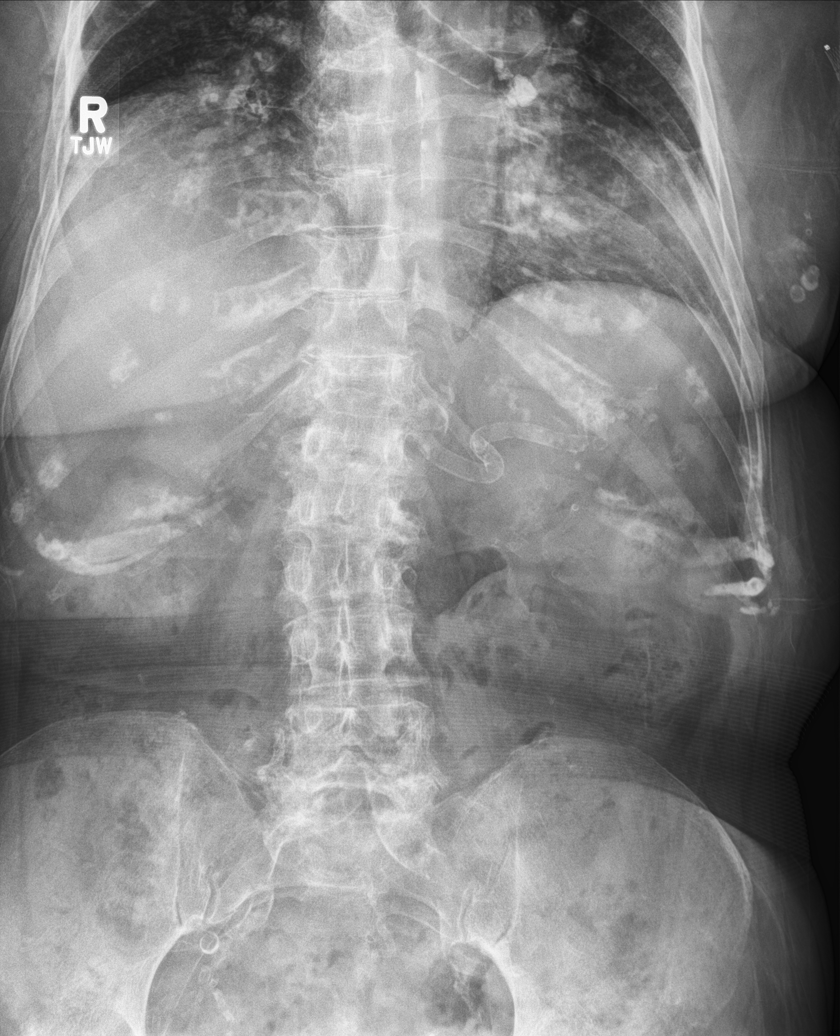

[l-spine obl (1 of 2)]
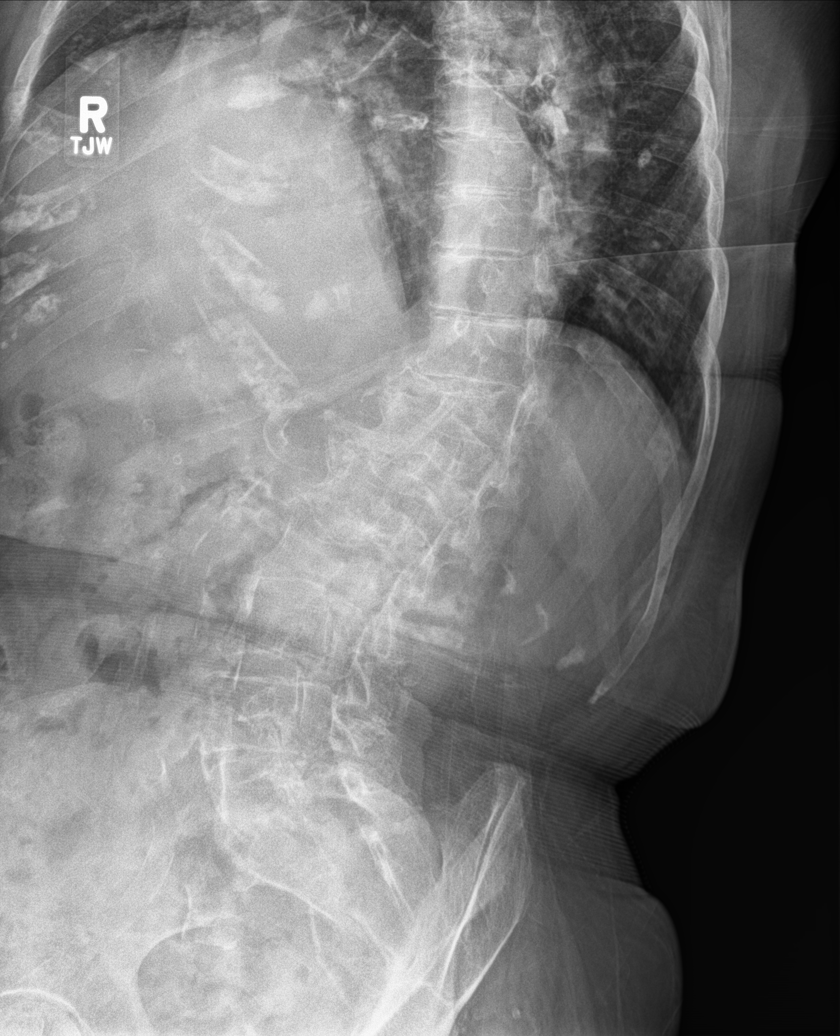

[l-spine obl (2 of 2)]
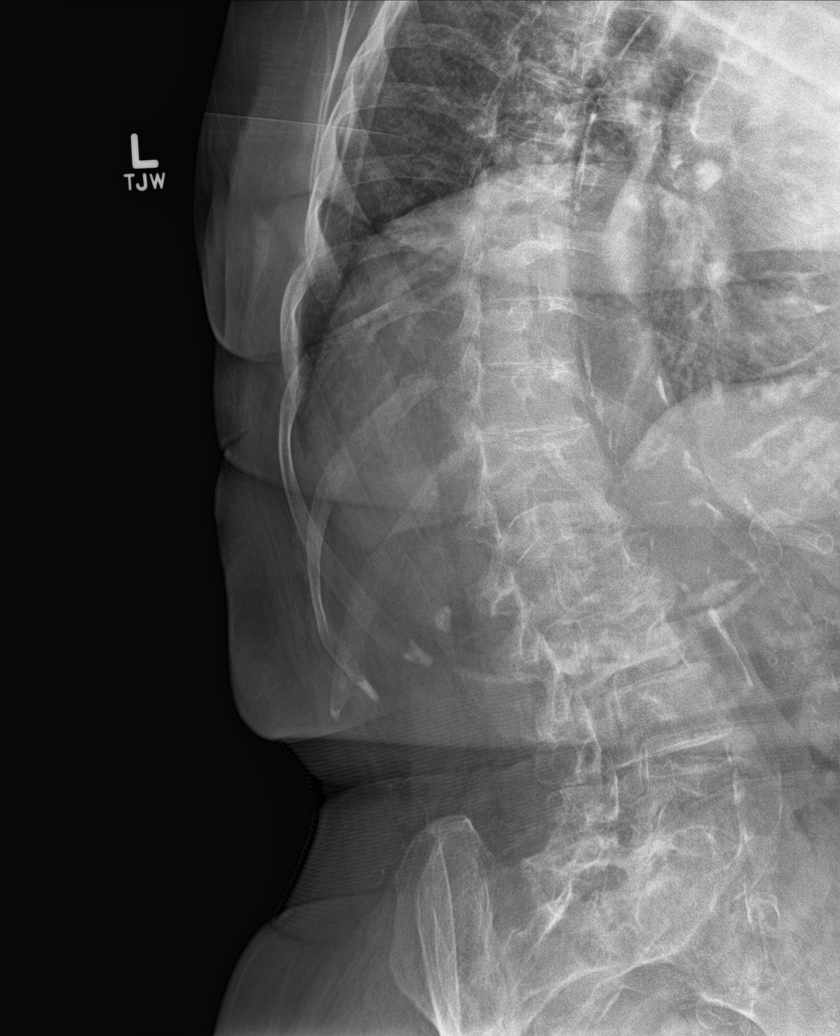

[l-spine lat]
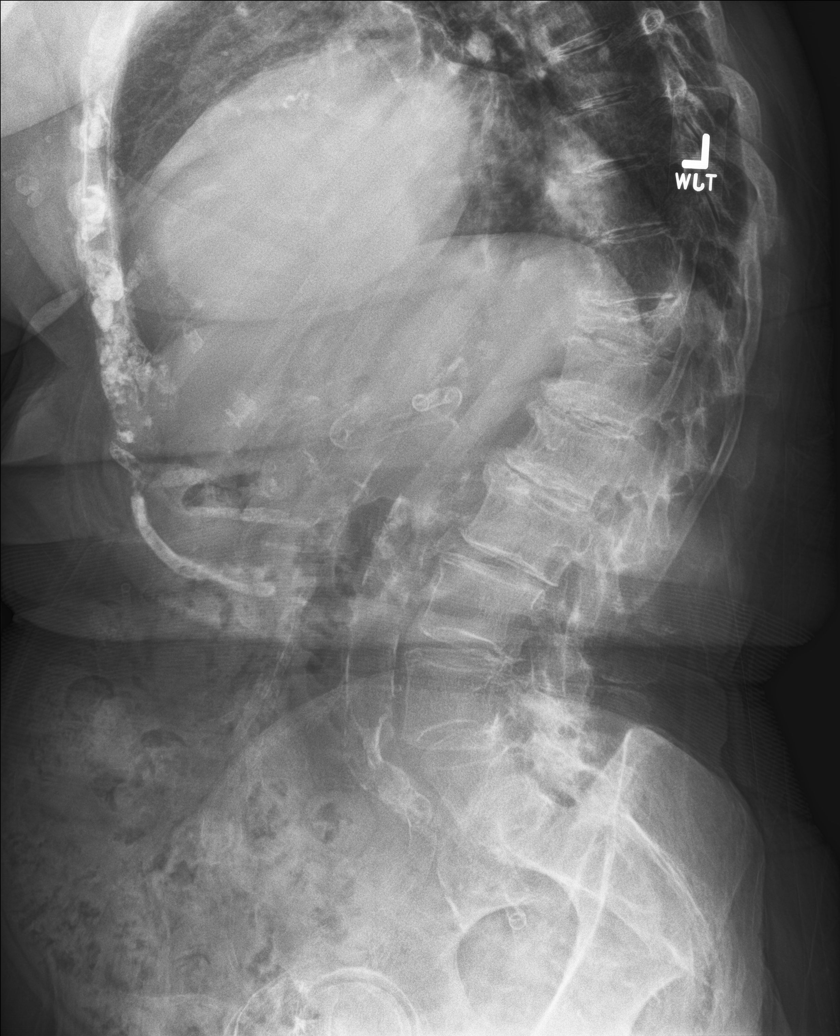

[l-spine l5/s1]
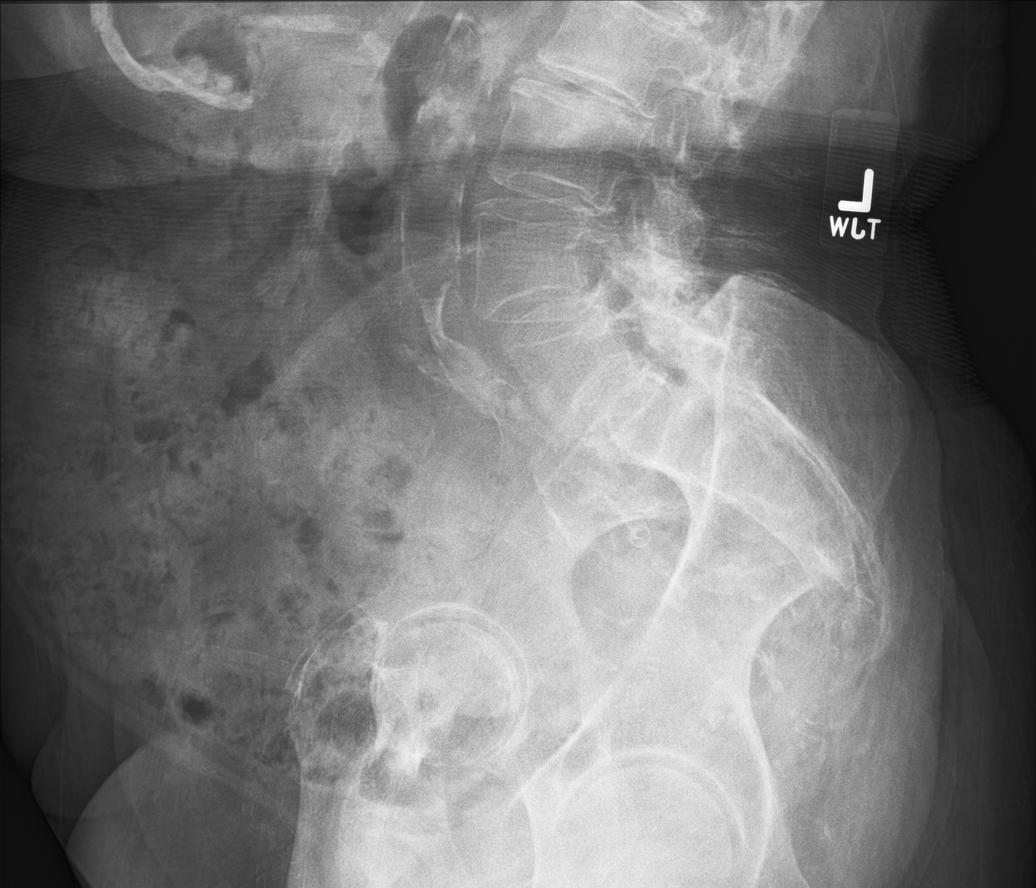

[5 of 5 positions shown; findings below may reference images not displayed]

FINDINGS: There is a thoracic kyphosis and slight lumbar lordosis present. The
bones are diffusely osteopenic. There is degenerative disc disease
at L5-S1, T12-L1 and L1-2 levels. There is partial compression
deformity of T11 of uncertain age with no definite retropulsion. The
bowel gas pattern is nonspecific. Splenic artery calcification is
present.
IMPRESSION: 1. Probable old compression deformity of T11.  No retropulsion.
2. Degenerative disc disease at T12-L1, L1-2, and L5-S1 levels.
3. Thoracic kyphosis and lumbar lordosis with diffuse osteopenia.
